# Patient Record
Sex: Female | Born: 1980
Health system: Southern US, Community
[De-identification: ages and names within clinical notes are randomized; demographics above are authoritative.]

## PROBLEM LIST (undated history)

## (undated) DIAGNOSIS — L509 Urticaria, unspecified: Secondary | ICD-10-CM

## (undated) DIAGNOSIS — G43909 Migraine, unspecified, not intractable, without status migrainosus: Secondary | ICD-10-CM

## (undated) DIAGNOSIS — I10 Essential (primary) hypertension: Secondary | ICD-10-CM

## (undated) DIAGNOSIS — N739 Female pelvic inflammatory disease, unspecified: Secondary | ICD-10-CM

## (undated) DIAGNOSIS — R51 Headache: Secondary | ICD-10-CM

## (undated) DIAGNOSIS — K219 Gastro-esophageal reflux disease without esophagitis: Secondary | ICD-10-CM

## (undated) DIAGNOSIS — Z9289 Personal history of other medical treatment: Secondary | ICD-10-CM

## (undated) DIAGNOSIS — F419 Anxiety disorder, unspecified: Secondary | ICD-10-CM

## (undated) DIAGNOSIS — J342 Deviated nasal septum: Secondary | ICD-10-CM

## (undated) DIAGNOSIS — N200 Calculus of kidney: Secondary | ICD-10-CM

## (undated) DIAGNOSIS — J343 Hypertrophy of nasal turbinates: Secondary | ICD-10-CM

## (undated) DIAGNOSIS — R109 Unspecified abdominal pain: Secondary | ICD-10-CM

## (undated) HISTORY — DX: Personal history of other medical treatment: Z92.89

## (undated) HISTORY — DX: Urticaria, unspecified: L50.9

## (undated) NOTE — *Deleted (*Deleted)
[facility] EMERGENCY DEPARTMENT Provider Note   CSN: [id number] Arrival date & time: 05/27/20  1328     History Chief Complaint  Patient presents with  . Chest Pain    Christina Fox is a 11 y.o. female.  HPI      Christina Fox is a 86 y.o. female, with a history of ***, presenting to the ED with chest pain.  Dx with bronchiitis, doxy and pred.   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 Abdominal pain    "right lower quadrant pain and right lower back pain  . Anxiety   . Deviated septum 09/2011  . GERD (gastroesophageal reflux disease)   . Headache(784.0)    sinus  . History of echocardiogram    Echo 11/17: EF 55-60, no RWMA, normal diastolic function  . Hypertension    no meds  . Kidney stones    no current problem  . Migraine   . Nasal turbinate hypertrophy 09/2011   bilat.  . Pelvic inflammatory disease   . Urticaria     Patient Active Problem List   Diagnosis Date Noted  . Gallstone 01/07/2016  . History of colonic polyps 11/27/2015  . Right lower quadrant abdominal pain 11/27/2015  . HTN (hypertension), benign 11/24/2015  . Abdominal pain 11/16/2015  . Headache 10/13/2015  . 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 Anxiety disorder 11/03/2014    Past Surgical History:  Procedure Laterality Date  . CHOLECYSTECTOMY N/A 01/15/2016   Procedure: LAPAROSCOPIC CHOLECYSTECTOMY WITH INTRAOPERATIVE CHOLANGIOGRAM;  Surgeon: Kieth Brightly, MD;  Location: [facility]S;  Service: General;  Laterality: N/A;  . COLONOSCOPY  2007   [facility]: pedunculated 15mm polyp in distal sigmoid, sessile 2mm polyp at splenic flexure, larger polyp tubulovillous adenoma and smaller polyp tubular adenoma   . COLONOSCOPY  09-09-15  . COLONOSCOPY WITH PROPOFOL N/A 03/01/2016   Procedure: COLONOSCOPY WITH PROPOFOL;  Surgeon: Charolett Bumpers, MD;  Location: [facility];  Service: Endoscopy;   Laterality: N/A;  . NASAL SEPTOPLASTY W/ TURBINOPLASTY  10/04/2011   Procedure: NASAL SEPTOPLASTY WITH TURBINATE REDUCTION;  Surgeon: Darletta Moll, MD;  Location: [facility];  Service: ENT;  Laterality: Bilateral;  . WISDOM TOOTH EXTRACTION  2009     OB History    Gravida  5   Para  4   Term  4   Preterm      AB  1   Living  4     SAB  1   TAB      Ectopic      Multiple      Live Births  4        Obstetric Comments  1st Menstrual Cycle:  10  1st Pregnancy:  70         Family History  Problem Relation Age of Onset  . Kidney cancer Mother   . Hypertension Mother   . Migraines Mother   . Multiple sclerosis Mother   . Fibromyalgia Mother   . Liver disease Mother        NASH   . Hypertension Father   . Liver disease Father        liver transplant; had fatty liver disease  . Spina bifida Brother   . Migraines Maternal Grandmother   . Hypertension Other   . Colon cancer Paternal Grandfather   . Allergic rhinitis Daughter   .  Asthma Daughter   . Allergic rhinitis Son   . Asthma Son   . Angioedema Neg Hx   . Eczema Neg Hx   . Immunodeficiency Neg Hx   . Urticaria Neg Hx     Social History   Tobacco Use  . Smoking status: Current Every Day Smoker    Packs/day: 0.00    Years: 10.00    Pack years: 0.00  . Smokeless tobacco: Never Used  Vaping Use  . Vaping Use: Never used  Substance Use Topics  . Alcohol use: Yes    Alcohol/week: 0.0 standard drinks    Comment: ocassionally  . Drug use: No    Home Medications Prior to Admission medications   Medication Sig Start Date End Date Taking? Authorizing Provider  acetaminophen (TYLENOL) 500 MG tablet Take 500-1,000 mg by mouth every 6 (six) hours as needed for mild pain.     [provider]  albuterol (VENTOLIN HFA) 108 (90 Base) MCG/ACT inhaler Inhale 2 puffs into the lungs every 6 (six) hours as needed for wheezing or shortness of breath.    [provider]  clonazePAM  (KLONOPIN) 0.5 MG tablet Take 0.5 mg by mouth 2 (two) times daily as needed for anxiety.     [provider]  cyclobenzaprine (FLEXERIL) 10 MG tablet Take 1 tablet (10 mg total) by mouth 2 (two) times daily as needed for muscle spasms. Patient not taking: Reported on 03/27/2020 12/15/19   Dahlia Byes A, NP  ibuprofen (ADVIL) 200 MG tablet Take 200 mg by mouth every 6 (six) hours as needed for moderate pain.     [provider]  levofloxacin (LEVAQUIN) 250 MG tablet Take 250 mg by mouth daily.    [provider]  loratadine (CLARITIN) 10 MG tablet Take 10 mg by mouth daily.    [provider]  montelukast (SINGULAIR) 10 MG tablet Take 10 mg by mouth at bedtime.    [provider]  nystatin (MYCOSTATIN) 100000 UNIT/ML suspension Take 5 mLs (500,000 Units total) by mouth 4 (four) times daily. Swish in mouth and swallow for up to 7 days until thrush resolves 05/21/20   Little, Ambrose Finland, MD  omeprazole (PRILOSEC) 40 MG capsule Take 40 mg by mouth daily.     [provider]  potassium chloride (KLOR-CON) 10 MEQ tablet Take 10 mEq by mouth daily.     [provider]  potassium chloride (KLOR-CON) 10 MEQ tablet Take 1 tablet (10 mEq total) by mouth 2 (two) times daily for 3 days. Patient not taking: Reported on 03/27/2020 03/01/20 03/27/20  Vanetta Mulders, MD  Prenat-Fe Poly-Methfol-FA-DHA (VITAFOL ULTRA) 29-0.6-0.4-200 MG CAPS Take 1 capsule by mouth daily before breakfast. 11/01/18   Brock Bad, MD  sertraline (ZOLOFT) 25 MG tablet Take 25 mg by mouth daily.    [provider]  Vitamin D, Ergocalciferol, (DRISDOL) 1.25 MG (50000 UNIT) CAPS capsule Take 50,000 Units by mouth See admin instructions. Twice weekly    [provider]  escitalopram (LEXAPRO) 10 MG tablet Take 10 mg by mouth daily.    09/02/11  [provider]    Allergies    Clarithromycin, Doxycycline, Dicyclomine, Sulfasalazine, Bentyl [dicyclomine  hcl], Haldol [haloperidol], Meclizine, Morphine and related, Toradol [ketorolac tromethamine], Macrobid [nitrofurantoin monohyd macro], Meclizine hcl, Nitrofurantoin, and Sulfa antibiotics  Review of Systems   Review of Systems  Physical Exam Updated Vital Signs BP (!) 162/92 (BP Location: Right Arm)   Pulse 75   Temp 98.7  F (37.1 C) (Oral)   Resp 20   Ht 5\' 2"  (1.575 m)   Wt 93.9 kg   LMP 05/17/2020   SpO2 98%   BMI 37.86 kg/m   Physical Exam  ED Results / Procedures / Treatments   Labs (all labs ordered are listed, but only abnormal results are displayed) Labs Reviewed  BASIC METABOLIC PANEL - Abnormal; Notable for the following components:      Result Value   Sodium 134 (*)    Potassium 3.4 (*)    Glucose, Bld 121 (*)    Calcium 8.8 (*)    All other components within normal limits  CBC - Abnormal; Notable for the following components:   WBC 14.8 (*)    All other components within normal limits  PREGNANCY, URINE  TROPONIN I (HIGH SENSITIVITY)  TROPONIN I (HIGH SENSITIVITY)    EKG None  Radiology DG Chest 2 View  Result Date: 05/27/2020 CLINICAL DATA:  Chest pain. EXAM: CHEST - 2 VIEW COMPARISON:  03/26/2020. FINDINGS: The heart size and mediastinal contours are within normal limits. Both lungs are clear. No visible pleural effusions or pneumothorax. No acute osseous abnormality. Cholecystectomy clips. IMPRESSION: No active cardiopulmonary disease. Electronically Signed   By: Feliberto Harts MD   On: 05/27/2020 14:31    Procedures Procedures (including critical care time)  Medications Ordered in ED Medications - No data to display  ED Course  I have reviewed the triage vital signs and the nursing notes.  Pertinent labs & imaging results that were available during my care of the patient were reviewed by me and considered in my medical decision making (see chart for details).    MDM Rules/Calculators/A&P                          *** Final Clinical  Impression(s) / ED Diagnoses Final diagnoses:  None    Rx / DC Orders ED Discharge Orders    None

---
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
## 1999-09-10 ENCOUNTER — Encounter: Admission: RE | Admit: 1999-09-10 | Discharge: 1999-09-10 | Payer: Self-pay | Admitting: Internal Medicine

## 1999-09-10 ENCOUNTER — Other Ambulatory Visit: Admission: RE | Admit: 1999-09-10 | Discharge: 1999-09-10 | Payer: Self-pay | Admitting: *Deleted

## 1999-09-16 ENCOUNTER — Inpatient Hospital Stay (HOSPITAL_COMMUNITY): Admission: AD | Admit: 1999-09-16 | Discharge: 1999-09-16 | Payer: Self-pay | Admitting: Obstetrics & Gynecology

## 2000-03-13 ENCOUNTER — Inpatient Hospital Stay (HOSPITAL_COMMUNITY): Admission: AD | Admit: 2000-03-13 | Discharge: 2000-03-13 | Payer: Self-pay | Admitting: Obstetrics

## 2000-03-13 ENCOUNTER — Encounter: Payer: Self-pay | Admitting: Obstetrics

## 2000-03-16 ENCOUNTER — Inpatient Hospital Stay (HOSPITAL_COMMUNITY): Admission: AD | Admit: 2000-03-16 | Discharge: 2000-03-16 | Payer: Self-pay | Admitting: *Deleted

## 2000-05-30 ENCOUNTER — Ambulatory Visit (HOSPITAL_COMMUNITY): Admission: RE | Admit: 2000-05-30 | Discharge: 2000-05-30 | Payer: Self-pay | Admitting: *Deleted

## 2000-05-30 ENCOUNTER — Encounter: Payer: Self-pay | Admitting: *Deleted

## 2000-09-20 ENCOUNTER — Inpatient Hospital Stay (HOSPITAL_COMMUNITY): Admission: AD | Admit: 2000-09-20 | Discharge: 2000-09-20 | Payer: Self-pay | Admitting: *Deleted

## 2000-09-23 ENCOUNTER — Inpatient Hospital Stay (HOSPITAL_COMMUNITY): Admission: AD | Admit: 2000-09-23 | Discharge: 2000-09-23 | Payer: Self-pay | Admitting: Obstetrics

## 2000-09-28 ENCOUNTER — Ambulatory Visit (HOSPITAL_COMMUNITY): Admission: RE | Admit: 2000-09-28 | Discharge: 2000-09-28 | Payer: Self-pay | Admitting: *Deleted

## 2000-10-08 ENCOUNTER — Inpatient Hospital Stay (HOSPITAL_COMMUNITY): Admission: AD | Admit: 2000-10-08 | Discharge: 2000-10-10 | Payer: Self-pay | Admitting: Obstetrics

## 2000-11-04 ENCOUNTER — Emergency Department (HOSPITAL_COMMUNITY): Admission: EM | Admit: 2000-11-04 | Discharge: 2000-11-04 | Payer: Self-pay | Admitting: Emergency Medicine

## 2001-04-17 ENCOUNTER — Inpatient Hospital Stay (HOSPITAL_COMMUNITY): Admission: AD | Admit: 2001-04-17 | Discharge: 2001-04-17 | Payer: Self-pay | Admitting: *Deleted

## 2001-04-18 ENCOUNTER — Encounter: Payer: Self-pay | Admitting: *Deleted

## 2001-05-02 ENCOUNTER — Encounter: Payer: Self-pay | Admitting: *Deleted

## 2001-05-02 ENCOUNTER — Inpatient Hospital Stay (HOSPITAL_COMMUNITY): Admission: RE | Admit: 2001-05-02 | Discharge: 2001-05-02 | Payer: Self-pay | Admitting: *Deleted

## 2001-06-01 ENCOUNTER — Encounter: Payer: Self-pay | Admitting: Obstetrics & Gynecology

## 2001-06-01 ENCOUNTER — Inpatient Hospital Stay (HOSPITAL_COMMUNITY): Admission: AD | Admit: 2001-06-01 | Discharge: 2001-06-01 | Payer: Self-pay | Admitting: Obstetrics & Gynecology

## 2001-06-18 ENCOUNTER — Inpatient Hospital Stay (HOSPITAL_COMMUNITY): Admission: AD | Admit: 2001-06-18 | Discharge: 2001-06-18 | Payer: Self-pay | Admitting: Obstetrics

## 2001-07-09 ENCOUNTER — Encounter: Admission: RE | Admit: 2001-07-09 | Discharge: 2001-07-09 | Payer: Self-pay

## 2001-07-18 ENCOUNTER — Inpatient Hospital Stay (HOSPITAL_COMMUNITY): Admission: AD | Admit: 2001-07-18 | Discharge: 2001-07-18 | Payer: Self-pay | Admitting: *Deleted

## 2001-07-23 ENCOUNTER — Ambulatory Visit (HOSPITAL_COMMUNITY): Admission: RE | Admit: 2001-07-23 | Discharge: 2001-07-23 | Payer: Self-pay | Admitting: *Deleted

## 2001-09-10 ENCOUNTER — Ambulatory Visit (HOSPITAL_COMMUNITY): Admission: RE | Admit: 2001-09-10 | Discharge: 2001-09-10 | Payer: Self-pay | Admitting: Obstetrics

## 2001-09-10 ENCOUNTER — Encounter: Payer: Self-pay | Admitting: Obstetrics

## 2001-12-13 ENCOUNTER — Encounter: Payer: Self-pay | Admitting: Obstetrics

## 2001-12-13 ENCOUNTER — Ambulatory Visit (HOSPITAL_COMMUNITY): Admission: RE | Admit: 2001-12-13 | Discharge: 2001-12-13 | Payer: Self-pay | Admitting: Obstetrics and Gynecology

## 2001-12-17 ENCOUNTER — Inpatient Hospital Stay (HOSPITAL_COMMUNITY): Admission: AD | Admit: 2001-12-17 | Discharge: 2001-12-18 | Payer: Self-pay | Admitting: Obstetrics

## 2002-10-21 ENCOUNTER — Encounter: Admission: RE | Admit: 2002-10-21 | Discharge: 2002-10-21 | Payer: Self-pay | Admitting: Internal Medicine

## 2003-03-09 ENCOUNTER — Emergency Department (HOSPITAL_COMMUNITY): Admission: EM | Admit: 2003-03-09 | Discharge: 2003-03-09 | Payer: Self-pay | Admitting: Emergency Medicine

## 2004-02-06 ENCOUNTER — Inpatient Hospital Stay (HOSPITAL_COMMUNITY): Admission: AD | Admit: 2004-02-06 | Discharge: 2004-02-06 | Payer: Self-pay | Admitting: Obstetrics & Gynecology

## 2004-12-03 ENCOUNTER — Emergency Department (HOSPITAL_COMMUNITY): Admission: EM | Admit: 2004-12-03 | Discharge: 2004-12-03 | Payer: Self-pay | Admitting: Emergency Medicine

## 2004-12-30 ENCOUNTER — Emergency Department (HOSPITAL_COMMUNITY): Admission: EM | Admit: 2004-12-30 | Discharge: 2004-12-30 | Payer: Self-pay | Admitting: Family Medicine

## 2005-03-17 ENCOUNTER — Emergency Department (HOSPITAL_COMMUNITY): Admission: EM | Admit: 2005-03-17 | Discharge: 2005-03-17 | Payer: Self-pay | Admitting: Emergency Medicine

## 2005-04-07 ENCOUNTER — Encounter: Admission: RE | Admit: 2005-04-07 | Discharge: 2005-04-07 | Payer: Self-pay | Admitting: Obstetrics

## 2005-05-10 ENCOUNTER — Emergency Department (HOSPITAL_COMMUNITY): Admission: EM | Admit: 2005-05-10 | Discharge: 2005-05-10 | Payer: Self-pay | Admitting: Emergency Medicine

## 2005-06-27 HISTORY — PX: COLONOSCOPY: SHX174

## 2005-09-14 ENCOUNTER — Emergency Department (HOSPITAL_COMMUNITY): Admission: EM | Admit: 2005-09-14 | Discharge: 2005-09-14 | Payer: Self-pay | Admitting: Family Medicine

## 2005-12-13 ENCOUNTER — Emergency Department (HOSPITAL_COMMUNITY): Admission: EM | Admit: 2005-12-13 | Discharge: 2005-12-13 | Payer: Self-pay | Admitting: Family Medicine

## 2006-04-07 ENCOUNTER — Emergency Department (HOSPITAL_COMMUNITY): Admission: EM | Admit: 2006-04-07 | Discharge: 2006-04-07 | Payer: Self-pay | Admitting: Family Medicine

## 2006-04-23 ENCOUNTER — Emergency Department (HOSPITAL_COMMUNITY): Admission: EM | Admit: 2006-04-23 | Discharge: 2006-04-23 | Payer: Self-pay | Admitting: Family Medicine

## 2006-06-24 ENCOUNTER — Emergency Department (HOSPITAL_COMMUNITY): Admission: EM | Admit: 2006-06-24 | Discharge: 2006-06-24 | Payer: Self-pay | Admitting: Emergency Medicine

## 2006-09-22 ENCOUNTER — Encounter: Admission: RE | Admit: 2006-09-22 | Discharge: 2006-09-22 | Payer: Self-pay | Admitting: Internal Medicine

## 2007-04-16 ENCOUNTER — Emergency Department (HOSPITAL_COMMUNITY): Admission: EM | Admit: 2007-04-16 | Discharge: 2007-04-16 | Payer: Self-pay | Admitting: Family Medicine

## 2007-06-01 ENCOUNTER — Emergency Department (HOSPITAL_COMMUNITY): Admission: EM | Admit: 2007-06-01 | Discharge: 2007-06-01 | Payer: Self-pay | Admitting: Family Medicine

## 2007-06-28 HISTORY — PX: WISDOM TOOTH EXTRACTION: SHX21

## 2007-07-09 ENCOUNTER — Emergency Department (HOSPITAL_COMMUNITY): Admission: EM | Admit: 2007-07-09 | Discharge: 2007-07-09 | Payer: Self-pay | Admitting: Emergency Medicine

## 2007-07-11 ENCOUNTER — Emergency Department (HOSPITAL_COMMUNITY): Admission: EM | Admit: 2007-07-11 | Discharge: 2007-07-11 | Payer: Self-pay | Admitting: *Deleted

## 2007-11-24 ENCOUNTER — Inpatient Hospital Stay (HOSPITAL_COMMUNITY): Admission: AD | Admit: 2007-11-24 | Discharge: 2007-11-24 | Payer: Self-pay | Admitting: Obstetrics

## 2008-02-22 ENCOUNTER — Ambulatory Visit (HOSPITAL_COMMUNITY): Admission: RE | Admit: 2008-02-22 | Discharge: 2008-02-22 | Payer: Self-pay | Admitting: Obstetrics

## 2008-03-01 ENCOUNTER — Inpatient Hospital Stay (HOSPITAL_COMMUNITY): Admission: AD | Admit: 2008-03-01 | Discharge: 2008-03-01 | Payer: Self-pay | Admitting: Obstetrics & Gynecology

## 2008-03-13 ENCOUNTER — Inpatient Hospital Stay (HOSPITAL_COMMUNITY): Admission: AD | Admit: 2008-03-13 | Discharge: 2008-03-13 | Payer: Self-pay | Admitting: Obstetrics

## 2008-06-22 ENCOUNTER — Emergency Department (HOSPITAL_COMMUNITY): Admission: EM | Admit: 2008-06-22 | Discharge: 2008-06-22 | Payer: Self-pay | Admitting: Family Medicine

## 2008-07-27 ENCOUNTER — Emergency Department (HOSPITAL_COMMUNITY): Admission: EM | Admit: 2008-07-27 | Discharge: 2008-07-27 | Payer: Self-pay | Admitting: Family Medicine

## 2009-04-06 ENCOUNTER — Inpatient Hospital Stay (HOSPITAL_COMMUNITY): Admission: AD | Admit: 2009-04-06 | Discharge: 2009-04-06 | Payer: Self-pay | Admitting: Obstetrics

## 2009-04-27 ENCOUNTER — Ambulatory Visit (HOSPITAL_COMMUNITY): Admission: RE | Admit: 2009-04-27 | Discharge: 2009-04-27 | Payer: Self-pay | Admitting: Obstetrics

## 2009-06-29 ENCOUNTER — Inpatient Hospital Stay (HOSPITAL_COMMUNITY): Admission: AD | Admit: 2009-06-29 | Discharge: 2009-06-29 | Payer: Self-pay | Admitting: Obstetrics

## 2009-07-09 ENCOUNTER — Ambulatory Visit (HOSPITAL_COMMUNITY): Admission: RE | Admit: 2009-07-09 | Discharge: 2009-07-09 | Payer: Self-pay | Admitting: Obstetrics

## 2009-08-27 ENCOUNTER — Inpatient Hospital Stay (HOSPITAL_COMMUNITY): Admission: AD | Admit: 2009-08-27 | Discharge: 2009-08-27 | Payer: Self-pay | Admitting: Obstetrics

## 2009-11-23 ENCOUNTER — Inpatient Hospital Stay (HOSPITAL_COMMUNITY): Admission: AD | Admit: 2009-11-23 | Discharge: 2009-11-23 | Payer: Self-pay | Admitting: Obstetrics

## 2009-12-07 ENCOUNTER — Inpatient Hospital Stay (HOSPITAL_COMMUNITY): Admission: RE | Admit: 2009-12-07 | Discharge: 2009-12-09 | Payer: Self-pay | Admitting: Obstetrics

## 2010-05-18 ENCOUNTER — Inpatient Hospital Stay (HOSPITAL_COMMUNITY)
Admission: AD | Admit: 2010-05-18 | Discharge: 2010-05-18 | Payer: Self-pay | Source: Home / Self Care | Admitting: Obstetrics

## 2010-07-18 ENCOUNTER — Encounter: Payer: Self-pay | Admitting: Obstetrics

## 2010-08-21 ENCOUNTER — Inpatient Hospital Stay (INDEPENDENT_AMBULATORY_CARE_PROVIDER_SITE_OTHER)
Admission: RE | Admit: 2010-08-21 | Discharge: 2010-08-21 | Disposition: A | Discharge status: Home / Self Care | Payer: Medicaid Other | Source: Ambulatory Visit | Attending: Family Medicine | Admitting: Family Medicine

## 2010-08-21 DIAGNOSIS — B9789 Other viral agents as the cause of diseases classified elsewhere: Secondary | ICD-10-CM

## 2010-08-21 DIAGNOSIS — K5289 Other specified noninfective gastroenteritis and colitis: Secondary | ICD-10-CM

## 2010-09-08 LAB — POCT PREGNANCY, URINE: Preg Test, Ur: NEGATIVE

## 2010-09-08 LAB — GC/CHLAMYDIA PROBE AMP, GENITAL
Chlamydia, DNA Probe: NEGATIVE
GC Probe Amp, Genital: NEGATIVE

## 2010-09-08 LAB — CBC
HCT: 36.7 % (ref 36.0–46.0)
Hemoglobin: 12.3 g/dL (ref 12.0–15.0)
MCH: 30.2 pg (ref 26.0–34.0)
MCHC: 33.6 g/dL (ref 30.0–36.0)
MCV: 89.7 fL (ref 78.0–100.0)
Platelets: 254 10*3/uL (ref 150–400)
RBC: 4.09 MIL/uL (ref 3.87–5.11)
RDW: 13.1 % (ref 11.5–15.5)
WBC: 9.5 10*3/uL (ref 4.0–10.5)

## 2010-09-08 LAB — URINALYSIS, ROUTINE W REFLEX MICROSCOPIC
Bilirubin Urine: NEGATIVE
Glucose, UA: 250 mg/dL — AB
Ketones, ur: 15 mg/dL — AB
Nitrite: POSITIVE — AB
Protein, ur: 100 mg/dL — AB
Specific Gravity, Urine: 1.025 (ref 1.005–1.030)
Urobilinogen, UA: >8 mg/dL — ABNORMAL HIGH (ref 0.0–1.0)
pH: 5 (ref 5.0–8.0)

## 2010-09-08 LAB — WET PREP, GENITAL
Clue Cells Wet Prep HPF POC: NONE SEEN
Trich, Wet Prep: NONE SEEN
Yeast Wet Prep HPF POC: NONE SEEN

## 2010-09-08 LAB — URINE MICROSCOPIC-ADD ON

## 2010-09-12 LAB — URINALYSIS, ROUTINE W REFLEX MICROSCOPIC
Bilirubin Urine: NEGATIVE
Glucose, UA: NEGATIVE mg/dL
Ketones, ur: 15 mg/dL — AB
Leukocytes, UA: NEGATIVE
Nitrite: NEGATIVE
Protein, ur: NEGATIVE mg/dL
Specific Gravity, Urine: >1.03 — ABNORMAL HIGH (ref 1.005–1.030)
Urobilinogen, UA: 0.2 mg/dL (ref 0.0–1.0)
pH: 5.5 (ref 5.0–8.0)

## 2010-09-12 LAB — DIFFERENTIAL
Basophils Relative: 0 % (ref 0–1)
Eosinophils Absolute: 0.3 10*3/uL (ref 0.0–0.7)
Monocytes Relative: 7 % (ref 3–12)
Neutrophils Relative %: 83 % — ABNORMAL HIGH (ref 43–77)

## 2010-09-12 LAB — CBC
MCHC: 34.4 g/dL (ref 30.0–36.0)
MCV: 92.1 fL (ref 78.0–100.0)
Platelets: 234 10*3/uL (ref 150–400)
RBC: 3.8 MIL/uL — ABNORMAL LOW (ref 3.87–5.11)
RDW: 12.9 % (ref 11.5–15.5)

## 2010-09-12 LAB — URINE MICROSCOPIC-ADD ON

## 2010-09-13 LAB — URINALYSIS, ROUTINE W REFLEX MICROSCOPIC
Glucose, UA: NEGATIVE mg/dL
Nitrite: NEGATIVE
Protein, ur: NEGATIVE mg/dL
Urobilinogen, UA: 0.2 mg/dL (ref 0.0–1.0)

## 2010-09-13 LAB — CBC
MCHC: 34.5 g/dL (ref 30.0–36.0)
MCHC: 34.8 g/dL (ref 30.0–36.0)
RBC: 3.57 MIL/uL — ABNORMAL LOW (ref 3.87–5.11)
RBC: 3.58 MIL/uL — ABNORMAL LOW (ref 3.87–5.11)
RDW: 13.3 % (ref 11.5–15.5)

## 2010-09-13 LAB — RPR: RPR Ser Ql: NONREACTIVE

## 2010-09-13 LAB — MRSA PCR SCREENING: MRSA by PCR: NEGATIVE

## 2010-09-20 LAB — URINALYSIS, ROUTINE W REFLEX MICROSCOPIC
Ketones, ur: NEGATIVE mg/dL
Nitrite: NEGATIVE
Protein, ur: NEGATIVE mg/dL
Urobilinogen, UA: 0.2 mg/dL (ref 0.0–1.0)

## 2010-10-01 LAB — WET PREP, GENITAL
Trich, Wet Prep: NONE SEEN
Yeast Wet Prep HPF POC: NONE SEEN

## 2010-10-01 LAB — URINALYSIS, ROUTINE W REFLEX MICROSCOPIC
Bilirubin Urine: NEGATIVE
Ketones, ur: NEGATIVE mg/dL
Nitrite: NEGATIVE
Urobilinogen, UA: 0.2 mg/dL (ref 0.0–1.0)

## 2010-10-01 LAB — URINE MICROSCOPIC-ADD ON

## 2010-10-01 LAB — CBC
MCHC: 33.2 g/dL (ref 30.0–36.0)
MCV: 92.3 fL (ref 78.0–100.0)
RBC: 4.41 MIL/uL (ref 3.87–5.11)
RDW: 12.3 % (ref 11.5–15.5)

## 2010-10-01 LAB — HCG, QUANTITATIVE, PREGNANCY: hCG, Beta Chain, Quant, S: 4941 m[IU]/mL — ABNORMAL HIGH (ref <5)

## 2011-03-02 ENCOUNTER — Encounter: Payer: Self-pay | Admitting: *Deleted

## 2011-03-02 ENCOUNTER — Emergency Department (INDEPENDENT_AMBULATORY_CARE_PROVIDER_SITE_OTHER): Payer: Medicaid Other

## 2011-03-02 ENCOUNTER — Emergency Department (HOSPITAL_BASED_OUTPATIENT_CLINIC_OR_DEPARTMENT_OTHER)
Admission: EM | Admit: 2011-03-02 | Discharge: 2011-03-02 | Disposition: A | Discharge status: Home / Self Care | Payer: Medicaid Other | Attending: Emergency Medicine | Admitting: Emergency Medicine

## 2011-03-02 DIAGNOSIS — M549 Dorsalgia, unspecified: Secondary | ICD-10-CM | POA: Insufficient documentation

## 2011-03-02 DIAGNOSIS — G8929 Other chronic pain: Secondary | ICD-10-CM | POA: Insufficient documentation

## 2011-03-02 DIAGNOSIS — M542 Cervicalgia: Secondary | ICD-10-CM

## 2011-03-02 DIAGNOSIS — F3289 Other specified depressive episodes: Secondary | ICD-10-CM | POA: Insufficient documentation

## 2011-03-02 DIAGNOSIS — F329 Major depressive disorder, single episode, unspecified: Secondary | ICD-10-CM | POA: Insufficient documentation

## 2011-03-02 MED ORDER — IBUPROFEN 800 MG PO TABS: 800.0000 mg | ORAL_TABLET | Freq: Three times a day (TID) | ORAL | Status: AC

## 2011-03-02 MED ORDER — HYDROCODONE-ACETAMINOPHEN 5-325 MG PO TABS
2.0000 | ORAL_TABLET | Freq: Once | ORAL | Status: AC
  Administered 2011-03-02: 2 via ORAL
  Filled 2011-03-02: qty 2

## 2011-03-02 MED ORDER — HYDROCODONE-ACETAMINOPHEN 5-325 MG PO TABS: 2.0000 | ORAL_TABLET | ORAL | Status: AC | PRN

## 2011-03-02 NOTE — ED Notes (Signed)
Pt c/o back pain and neck pain x 1 day w/o injury

## 2011-03-02 NOTE — ED Notes (Signed)
Pt c/o severe lower back pain that radiates upwards through the spine into the neck. Began earlier today. Denies any known injury or strenuous activity. Took advil approx 9hours ago with minimal relief. Pt states she has been seen at different facilities 4 times in the past year for back pain. Pt also here for pressure in her ears for several days and a "stabbing" feeling in her throat. Pt has allergies and is currently taking alka-seltzer plus allergies for symptoms with minimal relief. Pt is anxious on exam.

## 2011-03-02 NOTE — ED Provider Notes (Signed)
History     CSN: [id number] Arrival date & time: 03/02/2011  8:39 PM  Chief Complaint  Patient presents with  . Back Pain  . Neck Injury   Patient is a 30 y.o. female presenting with back pain and neck injury. The history is provided by the patient.  Back Pain  This is a new problem. The current episode started yesterday. The problem occurs constantly. The problem has been gradually worsening. The pain is associated with no known injury. The pain is present in the thoracic spine and lumbar spine. The pain does not radiate. The pain is at a severity of 6/10. The pain is moderate. The pain is worse during the day. She has tried nothing for the symptoms. The treatment provided moderate relief.  Neck Injury This is a new problem. The current episode started in the past 7 days. The problem occurs constantly. The problem has been gradually worsening. The symptoms are aggravated by nothing. She has tried nothing for the symptoms. The treatment provided no relief.  P  Past Medical History  Diagnosis Date  . Depression   . Kidney calculi     History reviewed. No pertinent past surgical history.  History reviewed. No pertinent family history.  History  Substance Use Topics  . Smoking status: Never Smoker   . Smokeless tobacco: Not on file  . Alcohol Use: No    OB History    Grav Para Term Preterm Abortions TAB SAB Ect Mult Living                  Review of Systems  Musculoskeletal: Positive for back pain.  All other systems reviewed and are negative.    Physical Exam  BP 125/72  Pulse 61  Temp(Src) 98.4 F (36.9 C) (Oral)  Resp 16  Wt 168 lb (76.204 kg)  SpO2 100%  LMP 02/11/2011  Physical Exam  Nursing note and vitals reviewed. Constitutional: She is oriented to person, place, and time. She appears well-developed and well-nourished.  HENT:  Head: Normocephalic and atraumatic.  Eyes: Conjunctivae and EOM are normal. Pupils are equal, round, and reactive to light.    Neck: Neck supple. Muscular tenderness present. Decreased range of motion present. No Brudzinski's sign and no Kernig's sign noted.  Cardiovascular: Normal rate.   Pulmonary/Chest: Effort normal.  Abdominal: Soft.  Musculoskeletal:       Diffusely tender t and ls spine  Neurological: She is alert and oriented to person, place, and time.  Skin: Skin is warm and dry.    ED Course  Procedures  MDM c spine no acute.  I will refer pt to Dr. Pearletha Forge for further evaluation.      Langston Masker, [place] 03/02/11 2218

## 2011-03-03 NOTE — ED Provider Notes (Signed)
Medical screening examination/treatment/procedure(s) were performed by non-physician practitioner and as supervising physician I was immediately available for consultation/collaboration.   Summers Buendia, MD 03/03/11 0154 

## 2011-03-17 LAB — URINALYSIS, ROUTINE W REFLEX MICROSCOPIC
Bilirubin Urine: NEGATIVE
Bilirubin Urine: NEGATIVE
Glucose, UA: NEGATIVE
Nitrite: NEGATIVE
Nitrite: NEGATIVE
Protein, ur: 30 — AB
Specific Gravity, Urine: 1.03
Specific Gravity, Urine: 1.03
Urobilinogen, UA: 0.2
Urobilinogen, UA: 0.2
pH: 7.5
pH: 8

## 2011-03-17 LAB — COMPREHENSIVE METABOLIC PANEL WITH GFR
AST: 14
Albumin: 4.2
CO2: 25
Calcium: 9.2
Creatinine, Ser: 0.63
GFR calc Af Amer: >60
GFR calc non Af Amer: >60
Total Protein: 6.9

## 2011-03-17 LAB — COMPREHENSIVE METABOLIC PANEL
ALT: 11
ALT: 13
AST: 17
Albumin: 3.7
Alkaline Phosphatase: 45
BUN: 8
CO2: 28
Calcium: 9.1
Chloride: 104
Creatinine, Ser: 0.67
GFR calc Af Amer: >60
GFR calc non Af Amer: >60
Glucose, Bld: 102 — ABNORMAL HIGH
Potassium: 3.5
Sodium: 134 — ABNORMAL LOW
Sodium: 139
Total Bilirubin: 1
Total Protein: 6

## 2011-03-17 LAB — CBC
HCT: 36.7
Hemoglobin: 13
MCHC: 35
MCHC: 35.5
MCV: 88.2
MCV: 88.4
Platelets: 255
Platelets: 276
RBC: 3.98
RBC: 4.17
RDW: 12.3
RDW: 12.5
WBC: 12.6 — ABNORMAL HIGH

## 2011-03-17 LAB — URINE MICROSCOPIC-ADD ON

## 2011-03-17 LAB — WET PREP, GENITAL
Trich, Wet Prep: NONE SEEN
WBC, Wet Prep HPF POC: NONE SEEN
Yeast Wet Prep HPF POC: NONE SEEN

## 2011-03-17 LAB — PREGNANCY, URINE
Preg Test, Ur: NEGATIVE
Preg Test, Ur: NEGATIVE

## 2011-03-17 LAB — DIFFERENTIAL
Basophils Absolute: 0.1
Basophils Relative: 1
Eosinophils Absolute: 0.4
Eosinophils Absolute: 0.5
Eosinophils Relative: 3
Eosinophils Relative: 8 — ABNORMAL HIGH
Lymphocytes Relative: 20
Lymphocytes Relative: 8 — ABNORMAL LOW
Lymphs Abs: 1.1
Lymphs Abs: 1.2
Monocytes Absolute: 0.7
Monocytes Relative: 11
Monocytes Relative: 6
Neutro Abs: 10.4 — ABNORMAL HIGH
Neutrophils Relative %: 82 — ABNORMAL HIGH

## 2011-03-17 LAB — GC/CHLAMYDIA PROBE AMP, GENITAL
Chlamydia, DNA Probe: NEGATIVE
GC Probe Amp, Genital: NEGATIVE

## 2011-03-23 LAB — URINE CULTURE: Colony Count: >=100000

## 2011-03-23 LAB — URINE MICROSCOPIC-ADD ON

## 2011-03-23 LAB — URINALYSIS, ROUTINE W REFLEX MICROSCOPIC
Glucose, UA: NEGATIVE
Ketones, ur: NEGATIVE
pH: 6

## 2011-03-23 LAB — POCT PREGNANCY, URINE
Operator id: 292781
Preg Test, Ur: NEGATIVE

## 2011-03-23 LAB — DIFFERENTIAL
Blasts: 0
Eosinophils Absolute: 0.4
Metamyelocytes Relative: 0
Myelocytes: 0
Promyelocytes Absolute: 0
nRBC: 0

## 2011-03-23 LAB — CBC
HCT: 38.1
MCHC: 35.6
MCV: 91.3
Platelets: 288
RDW: 12.2

## 2011-03-23 LAB — WET PREP, GENITAL
Clue Cells Wet Prep HPF POC: NONE SEEN
Trich, Wet Prep: NONE SEEN

## 2011-03-28 LAB — URINALYSIS, ROUTINE W REFLEX MICROSCOPIC
Bilirubin Urine: NEGATIVE
Ketones, ur: NEGATIVE
Leukocytes, UA: NEGATIVE
Nitrite: NEGATIVE
Protein, ur: NEGATIVE
Specific Gravity, Urine: >1.03 — ABNORMAL HIGH

## 2011-03-28 LAB — URINE MICROSCOPIC-ADD ON

## 2011-03-28 LAB — URINE CULTURE
Colony Count: NO GROWTH
Culture: NO GROWTH

## 2011-03-28 LAB — POCT PREGNANCY, URINE: Preg Test, Ur: NEGATIVE

## 2011-03-30 LAB — URINALYSIS, ROUTINE W REFLEX MICROSCOPIC
Bilirubin Urine: NEGATIVE
Glucose, UA: NEGATIVE
Specific Gravity, Urine: >1.03 — ABNORMAL HIGH
Urobilinogen, UA: 0.2

## 2011-03-30 LAB — CBC
HCT: 39
Hemoglobin: 13.1
MCHC: 33.5
RBC: 4.16
RDW: 13.1

## 2011-03-30 LAB — DIFFERENTIAL
Basophils Absolute: 0.1
Eosinophils Relative: 6 — ABNORMAL HIGH
Lymphocytes Relative: 23
Monocytes Absolute: 0.9
Monocytes Relative: 10
Neutro Abs: 5.3

## 2011-03-30 LAB — URINE MICROSCOPIC-ADD ON

## 2011-03-30 LAB — GC/CHLAMYDIA PROBE AMP, GENITAL: Chlamydia, DNA Probe: NEGATIVE

## 2011-03-30 LAB — WET PREP, GENITAL

## 2011-03-30 LAB — POCT PREGNANCY, URINE: Preg Test, Ur: NEGATIVE

## 2011-04-01 LAB — POCT PREGNANCY, URINE: Preg Test, Ur: NEGATIVE

## 2011-04-04 LAB — POCT URINALYSIS DIP (DEVICE)
Nitrite: NEGATIVE
Operator id: 126491
Protein, ur: 30 — AB
Urobilinogen, UA: 1
pH: 6.5

## 2011-04-04 LAB — POCT RAPID STREP A: Streptococcus, Group A Screen (Direct): NEGATIVE

## 2011-04-21 ENCOUNTER — Emergency Department (HOSPITAL_COMMUNITY): Payer: Medicaid Other

## 2011-04-21 ENCOUNTER — Emergency Department (HOSPITAL_COMMUNITY)
Admission: EM | Admit: 2011-04-21 | Discharge: 2011-04-21 | Disposition: A | Discharge status: Home / Self Care | Payer: Medicaid Other | Attending: Emergency Medicine | Admitting: Emergency Medicine

## 2011-04-21 DIAGNOSIS — J069 Acute upper respiratory infection, unspecified: Secondary | ICD-10-CM | POA: Insufficient documentation

## 2011-04-21 DIAGNOSIS — Z79899 Other long term (current) drug therapy: Secondary | ICD-10-CM | POA: Insufficient documentation

## 2011-04-21 DIAGNOSIS — J3489 Other specified disorders of nose and nasal sinuses: Secondary | ICD-10-CM | POA: Insufficient documentation

## 2011-04-21 DIAGNOSIS — R05 Cough: Secondary | ICD-10-CM | POA: Insufficient documentation

## 2011-04-21 DIAGNOSIS — R059 Cough, unspecified: Secondary | ICD-10-CM | POA: Insufficient documentation

## 2011-04-21 DIAGNOSIS — B9789 Other viral agents as the cause of diseases classified elsewhere: Secondary | ICD-10-CM | POA: Insufficient documentation

## 2011-05-19 ENCOUNTER — Emergency Department (HOSPITAL_COMMUNITY)
Admission: EM | Admit: 2011-05-19 | Discharge: 2011-05-20 | Disposition: A | Discharge status: Home / Self Care | Payer: Medicaid Other | Attending: Emergency Medicine | Admitting: Emergency Medicine

## 2011-05-19 ENCOUNTER — Encounter (HOSPITAL_COMMUNITY): Payer: Self-pay | Admitting: *Deleted

## 2011-05-19 DIAGNOSIS — J3489 Other specified disorders of nose and nasal sinuses: Secondary | ICD-10-CM | POA: Insufficient documentation

## 2011-05-19 DIAGNOSIS — F329 Major depressive disorder, single episode, unspecified: Secondary | ICD-10-CM | POA: Insufficient documentation

## 2011-05-19 DIAGNOSIS — Z79899 Other long term (current) drug therapy: Secondary | ICD-10-CM | POA: Insufficient documentation

## 2011-05-19 DIAGNOSIS — R05 Cough: Secondary | ICD-10-CM

## 2011-05-19 DIAGNOSIS — R059 Cough, unspecified: Secondary | ICD-10-CM | POA: Insufficient documentation

## 2011-05-19 DIAGNOSIS — F3289 Other specified depressive episodes: Secondary | ICD-10-CM | POA: Insufficient documentation

## 2011-05-19 DIAGNOSIS — Z87442 Personal history of urinary calculi: Secondary | ICD-10-CM | POA: Insufficient documentation

## 2011-05-19 DIAGNOSIS — IMO0002 Reserved for concepts with insufficient information to code with codable children: Secondary | ICD-10-CM | POA: Insufficient documentation

## 2011-05-19 DIAGNOSIS — R51 Headache: Secondary | ICD-10-CM | POA: Insufficient documentation

## 2011-05-19 NOTE — ED Notes (Signed)
Pt in c/o cough and body aches x2 months, states she has been seen for same and completed antibiotics

## 2011-05-20 MED ORDER — PREDNISONE 20 MG PO TABS
40.0000 mg | ORAL_TABLET | Freq: Once | ORAL | Status: AC
  Administered 2011-05-20: 40 mg via ORAL
  Filled 2011-05-20: qty 2

## 2011-05-20 MED ORDER — AEROCHAMBER MAX W/MASK SMALL MISC
1.0000 | Freq: Once | Status: AC
  Administered 2011-05-20: 1

## 2011-05-20 MED ORDER — PREDNISONE 20 MG PO TABS: 40.0000 mg | ORAL_TABLET | Freq: Every day | ORAL | Status: AC

## 2011-05-20 MED ORDER — ALBUTEROL SULFATE HFA 108 (90 BASE) MCG/ACT IN AERS
2.0000 | INHALATION_SPRAY | RESPIRATORY_TRACT | Status: DC | PRN
  Administered 2011-05-20: 2 via RESPIRATORY_TRACT
  Filled 2011-05-20: qty 6.7

## 2011-05-20 NOTE — ED Provider Notes (Signed)
History     CSN: [id number] Arrival date & time: 05/19/2011  9:22 PM   First MD Initiated Contact with Patient 05/19/11 2349      Chief Complaint  Patient presents with  . Cough    (Consider location/radiation/quality/duration/timing/severity/associated sxs/prior treatment) HPI Comments: Patient with two-month history of cough and nasal congestion. She has been seen in the emergency department prior and by her primary care doctor. She has completed a course of antibiotics and has used albuterol inhalers as well as cough syrups. She continues to have chronic cough and nasal congestion despite these measures. Her cough has not changed but is persistent. She denies fever, ear pain, sore throat, nausea, vomiting, diarrhea, urinary symptoms.  Patient is a 30 y.o. female presenting with cough. The history is provided by the patient.  Cough This is a chronic problem. The current episode started more than 1 week ago. The problem has not changed since onset.The cough is productive of sputum. There has been no fever. Associated symptoms include ear congestion, headaches and rhinorrhea. Pertinent negatives include no chest pain, no chills, no weight loss, no ear pain, no sore throat, no myalgias, no shortness of breath, no wheezing and no eye redness. She has tried cough syrup, an opioid and decongestants for the symptoms.    Past Medical History  Diagnosis Date  . Depression   . Kidney calculi     History reviewed. No pertinent past surgical history.  History reviewed. No pertinent family history.  History  Substance Use Topics  . Smoking status: Never Smoker   . Smokeless tobacco: Not on file  . Alcohol Use: No    OB History    Grav Para Term Preterm Abortions TAB SAB Ect Mult Living                  Review of Systems  Constitutional: Negative for fever, chills and weight loss.  HENT: Positive for rhinorrhea. Negative for ear pain and sore throat.   Eyes: Negative for discharge  and redness.  Respiratory: Positive for cough. Negative for shortness of breath and wheezing.   Cardiovascular: Negative for chest pain.  Gastrointestinal: Negative for nausea, vomiting, abdominal pain, diarrhea and constipation.  Genitourinary: Negative for dysuria and hematuria.  Musculoskeletal: Negative for myalgias.  Skin: Negative for rash.  Neurological: Positive for headaches.  Psychiatric/Behavioral: Negative for confusion.    Allergies  Sulfa antibiotics  Home Medications   Current Outpatient Rx  Name Route Sig Dispense Refill  . ALKA-SELTZER PO Oral Take 325 mg by mouth at bedtime as needed. Seasonal allergies     . BUTALBITAL-APAP-CAFFEINE 50-325-40 MG PO TABS Oral Take 2 tablets by mouth 2 (two) times daily as needed. migraine     . ESCITALOPRAM OXALATE 10 MG PO TABS Oral Take 10 mg by mouth daily.      . ETONOGESTREL 68 MG [place] IMPL Subcutaneous Inject into the skin once.      Marland Kitchen PREDNISONE 20 MG PO TABS Oral Take 2 tablets (40 mg total) by mouth daily. 8 tablet 0    BP 128/89  Pulse 72  Temp(Src) 98.5 F (36.9 C) (Oral)  Resp 16  SpO2 100%  LMP 05/08/2011  Physical Exam  Nursing note and vitals reviewed. Constitutional: She is oriented to person, place, and time. She appears well-developed and well-nourished.  HENT:  Head: Normocephalic and atraumatic.  Right Ear: External ear normal.  Left Ear: External ear normal.  Nose: Mucosal edema and rhinorrhea present.  Mouth/Throat: Oropharynx  is clear and moist. No oropharyngeal exudate.  Eyes: Pupils are equal, round, and reactive to light. Right eye exhibits no discharge. Left eye exhibits no discharge.  Neck: Normal range of motion. Neck supple.  Cardiovascular: Normal rate and regular rhythm.  Exam reveals no gallop and no friction rub.   No murmur heard. Pulmonary/Chest: Effort normal and breath sounds normal. No respiratory distress. She has no wheezes.  Abdominal: Soft. There is no tenderness. There is no  rebound and no guarding.  Musculoskeletal: Normal range of motion.  Neurological: She is alert and oriented to person, place, and time.  Skin: Skin is warm and dry. No rash noted.  Psychiatric: She has a normal mood and affect.    ED Course  Procedures (including critical care time)  Labs Reviewed - No data to display No results found.   1. Cough    Patient was seen and examined. Discussed measures to treat symptoms of chronic cough. Will try course of prednisone and albuterol inhaler which the patient has had success with in the past. Urged followup with primary care doctor and return with worsening. Patient verbalized understanding and agreed plan   MDM  Patient with nasal congestion and cough x2 months. She has not had fever. Cough is not worse than at baseline. Do not suspect pneumonia. Possible allergic component. Vital signs are stable, patient appears well.        Eustace Moore [place], [place] 05/20/11 0115  Medical screening examination/treatment/procedure(s) were performed by non-physician practitioner and as supervising physician I was immediately available for consultation/collaboration.  Sunnie Nielsen, MD 05/20/11 [phone]

## 2011-07-21 ENCOUNTER — Ambulatory Visit (INDEPENDENT_AMBULATORY_CARE_PROVIDER_SITE_OTHER): Payer: Medicaid Other | Admitting: Otolaryngology

## 2011-08-16 ENCOUNTER — Other Ambulatory Visit: Payer: Self-pay | Admitting: Obstetrics

## 2011-08-18 ENCOUNTER — Ambulatory Visit (INDEPENDENT_AMBULATORY_CARE_PROVIDER_SITE_OTHER): Payer: Medicaid Other | Admitting: Otolaryngology

## 2011-08-18 DIAGNOSIS — J342 Deviated nasal septum: Secondary | ICD-10-CM

## 2011-08-18 DIAGNOSIS — J343 Hypertrophy of nasal turbinates: Secondary | ICD-10-CM

## 2011-08-18 DIAGNOSIS — J310 Chronic rhinitis: Secondary | ICD-10-CM

## 2011-08-19 ENCOUNTER — Other Ambulatory Visit (INDEPENDENT_AMBULATORY_CARE_PROVIDER_SITE_OTHER): Payer: Self-pay | Admitting: Otolaryngology

## 2011-08-19 DIAGNOSIS — J342 Deviated nasal septum: Secondary | ICD-10-CM

## 2011-08-19 DIAGNOSIS — J320 Chronic maxillary sinusitis: Secondary | ICD-10-CM

## 2011-08-24 ENCOUNTER — Ambulatory Visit
Admission: RE | Admit: 2011-08-24 | Discharge: 2011-08-24 | Disposition: A | Discharge status: Home / Self Care | Payer: Medicaid Other | Source: Ambulatory Visit | Attending: Otolaryngology | Admitting: Otolaryngology

## 2011-08-24 DIAGNOSIS — J342 Deviated nasal septum: Secondary | ICD-10-CM

## 2011-08-24 DIAGNOSIS — J320 Chronic maxillary sinusitis: Secondary | ICD-10-CM

## 2011-09-02 ENCOUNTER — Emergency Department (HOSPITAL_COMMUNITY)
Admission: EM | Admit: 2011-09-02 | Discharge: 2011-09-03 | Disposition: A | Discharge status: Home / Self Care | Payer: Medicaid Other | Attending: Emergency Medicine | Admitting: Emergency Medicine

## 2011-09-02 ENCOUNTER — Encounter (HOSPITAL_COMMUNITY): Payer: Self-pay | Admitting: Emergency Medicine

## 2011-09-02 DIAGNOSIS — R142 Eructation: Secondary | ICD-10-CM | POA: Insufficient documentation

## 2011-09-02 DIAGNOSIS — K219 Gastro-esophageal reflux disease without esophagitis: Secondary | ICD-10-CM | POA: Insufficient documentation

## 2011-09-02 DIAGNOSIS — R143 Flatulence: Secondary | ICD-10-CM | POA: Insufficient documentation

## 2011-09-02 DIAGNOSIS — R141 Gas pain: Secondary | ICD-10-CM | POA: Insufficient documentation

## 2011-09-02 NOTE — ED Notes (Signed)
Pt states she has been having heartburn for the past month but it has been worse for the past few days  Pt states she has gained 8 lbs in the past week  Pt states she just doesn't feel right lately  Pt states today the heartburn has been constant  Has taken tums without relief

## 2011-09-03 ENCOUNTER — Encounter (HOSPITAL_COMMUNITY): Payer: Self-pay | Admitting: *Deleted

## 2011-09-03 LAB — CBC
MCH: 29.7 pg (ref 26.0–34.0)
MCHC: 33.7 g/dL (ref 30.0–36.0)
Platelets: 247 10*3/uL (ref 150–400)
RDW: 12.6 % (ref 11.5–15.5)

## 2011-09-03 LAB — COMPREHENSIVE METABOLIC PANEL
ALT: 14 U/L (ref 0–35)
AST: 19 U/L (ref 0–37)
Albumin: 3.8 g/dL (ref 3.5–5.2)
Calcium: 9.3 mg/dL (ref 8.4–10.5)
GFR calc Af Amer: >90 mL/min (ref >90)
Glucose, Bld: 88 mg/dL (ref 70–99)
Potassium: 3.2 mEq/L — ABNORMAL LOW (ref 3.5–5.1)
Sodium: 140 mEq/L (ref 135–145)
Total Protein: 6.8 g/dL (ref 6.0–8.3)

## 2011-09-03 MED ORDER — FAMOTIDINE 20 MG PO TABS: 20.0000 mg | ORAL_TABLET | Freq: Two times a day (BID) | ORAL | Status: DC

## 2011-09-03 MED ORDER — GI COCKTAIL ~~LOC~~
30.0000 mL | Freq: Once | ORAL | Status: AC
  Administered 2011-09-03: 30 mL via ORAL
  Filled 2011-09-03: qty 30

## 2011-09-03 NOTE — ED Provider Notes (Signed)
Medical screening examination/treatment/procedure(s) were performed by non-physician practitioner and as supervising physician I was immediately available for consultation/collaboration.   Rex Oesterle L Mesha Schamberger, MD 09/03/11 0612 

## 2011-09-03 NOTE — ED Notes (Signed)
Pt states she has persistent heartburn for "weeks" now. Pt states she has heartburn after every meal. Pt denies any use of any kind of OTC antacid medication. Pt appears to be in no apparent distress.

## 2011-09-03 NOTE — Discharge Instructions (Signed)
Diet for GERD or PUD Nutrition therapy can help ease the discomfort of gastroesophageal reflux disease (GERD) and peptic ulcer disease (PUD).  HOME CARE INSTRUCTIONS   Eat your meals slowly, in a relaxed setting.   Eat 5 to 6 small meals per day.   If a food causes distress, stop eating it for a period of time.  FOODS TO AVOID  Coffee, regular or decaffeinated.   Cola beverages, regular or low calorie.   Tea, regular or decaffeinated.   Pepper.   Cocoa.   High fat foods, including meats.   Butter, margarine, hydrogenated oil (trans fats).   Peppermint or spearmint (if you have GERD).   Fruits and vegetables if not tolerated.   Alcohol.   Nicotine (smoking or chewing). This is one of the most potent stimulants to acid production in the gastrointestinal tract.   Any food that seems to aggravate your condition.  If you have questions regarding your diet, ask your caregiver or a registered dietitian. TIPS  Lying flat may make symptoms worse. Keep the head of your bed raised 6 to 9 inches (15 to 23 cm) by using a foam wedge or blocks under the legs of the bed.   Do not lay down until 3 hours after eating a meal.   Daily physical activity may help reduce symptoms.  MAKE SURE YOU:   Understand these instructions.   Will watch your condition.   Will get help right away if you are not doing well or get worse.  Document Released: 06/13/2005 Document Revised: 06/02/2011 Document Reviewed: 04/29/2011 [facility] Patient Information 2012 [place], [place]. You have been given a medication to help control the acid production in your stomach please take this on a regular basis twice a day for one week and then back off to once a day.  After that was negative with Dr. Dulce Sellar for further evaluation of your gastric reflux disease.

## 2011-09-03 NOTE — ED Provider Notes (Signed)
History     CSN: [id number]  Arrival date & time 09/02/11  2135   First MD Initiated Contact with Patient 09/03/11 0007      Chief Complaint  Patient presents with  . Heartburn    (Consider location/radiation/quality/duration/timing/severity/associated sxs/prior treatment) HPI Comments: This 31 year old chubby female, who is one year post partum, having continuous epigastric burning sensation that radiates to mid chest.  She's been taking toms on a regular basis, but for the last couple days.  This is not relieving her discomfort  Patient is a 31 y.o. female presenting with heartburn. The history is provided by the patient.  Heartburn This is a chronic problem. The current episode started more than 1 month ago. The problem occurs constantly. The problem has been gradually worsening. Associated symptoms include abdominal pain and nausea. Pertinent negatives include no chills, fever or vomiting.    Past Medical History  Diagnosis Date  . Depression   . Kidney calculi     Past Surgical History  Procedure Date  . Polyps removed     Family History  Problem Relation Age of Onset  . Cancer Mother   . Hypertension Mother   . Hypertension Father   . Cancer Brother   . Hypertension Other     History  Substance Use Topics  . Smoking status: Never Smoker   . Smokeless tobacco: Not on file  . Alcohol Use: No    OB History    Grav Para Term Preterm Abortions TAB SAB Ect Mult Living                  Review of Systems  Constitutional: Negative for fever and chills.  Gastrointestinal: Positive for heartburn, nausea and abdominal pain. Negative for vomiting.    Allergies  Sulfa antibiotics  Home Medications   Current Outpatient Rx  Name Route Sig Dispense Refill  . BUTALBITAL-APAP-CAFFEINE 50-325-40 MG PO TABS Oral Take 2 tablets by mouth 2 (two) times daily as needed. migraine     . CLONAZEPAM 1 MG PO TABS Oral Take 1 mg by mouth at bedtime as needed.    .  ETONOGESTREL 68 MG [place] IMPL Subcutaneous Inject into the skin once.      . IBUPROFEN 400 MG PO TABS Oral Take 400 mg by mouth every 6 (six) hours as needed. For pain    . FAMOTIDINE 20 MG PO TABS Oral Take 1 tablet (20 mg total) by mouth 2 (two) times daily. 60 tablet 0    Take 1 tablet twice a day for the first 7 days and ...    BP 151/85  Pulse 88  Temp(Src) 98.5 F (36.9 C) (Oral)  Resp 16  SpO2 100%  LMP 08/18/2011  Physical Exam  Constitutional: She is oriented to person, place, and time. She appears well-developed and well-nourished.  HENT:  Head: Normocephalic.  Eyes: Pupils are equal, round, and reactive to light.  Cardiovascular: Normal rate.   Abdominal: Soft. Bowel sounds are normal. She exhibits distension. There is no tenderness. There is no rebound and no guarding.  Musculoskeletal: Normal range of motion.  Neurological: She is alert and oriented to person, place, and time.  Skin: Skin is warm.    ED Course  Procedures (including critical care time)  Labs Reviewed  COMPREHENSIVE METABOLIC PANEL - Abnormal; Notable for the following:    Potassium 3.2 (*)    All other components within normal limits  CBC   No results found.   1. GERD (gastroesophageal  reflux disease)       MDM  Will obtain CBC, and C Met  Provide patient with a GI cocktail, and then reevaluate Returned tests are normal.  She did receive relief with a GI, cocktail, we'll prescribe Pepcid 20 twice a day for one week and then daily thereafter.  Have referred patient to Dr. Dulce Sellar at [facility] for followup       Arman Filter, NP 09/03/11 1610  Arman Filter, NP 09/03/11 [phone]

## 2011-09-15 ENCOUNTER — Ambulatory Visit (INDEPENDENT_AMBULATORY_CARE_PROVIDER_SITE_OTHER): Payer: Medicaid Other | Admitting: Otolaryngology

## 2011-09-15 DIAGNOSIS — J342 Deviated nasal septum: Secondary | ICD-10-CM

## 2011-09-15 DIAGNOSIS — J343 Hypertrophy of nasal turbinates: Secondary | ICD-10-CM

## 2011-09-15 DIAGNOSIS — J310 Chronic rhinitis: Secondary | ICD-10-CM

## 2011-09-26 DIAGNOSIS — J343 Hypertrophy of nasal turbinates: Secondary | ICD-10-CM

## 2011-09-26 DIAGNOSIS — J342 Deviated nasal septum: Secondary | ICD-10-CM

## 2011-09-26 HISTORY — DX: Deviated nasal septum: J34.2

## 2011-09-26 HISTORY — DX: Hypertrophy of nasal turbinates: J34.3

## 2011-09-27 ENCOUNTER — Encounter (HOSPITAL_BASED_OUTPATIENT_CLINIC_OR_DEPARTMENT_OTHER): Payer: Self-pay | Admitting: *Deleted

## 2011-10-04 ENCOUNTER — Ambulatory Visit (HOSPITAL_BASED_OUTPATIENT_CLINIC_OR_DEPARTMENT_OTHER)
Admission: RE | Admit: 2011-10-04 | Discharge: 2011-10-04 | Disposition: A | Discharge status: Home / Self Care | Payer: Medicaid Other | Source: Ambulatory Visit | Attending: Otolaryngology | Admitting: Otolaryngology

## 2011-10-04 ENCOUNTER — Encounter (HOSPITAL_BASED_OUTPATIENT_CLINIC_OR_DEPARTMENT_OTHER): Payer: Self-pay | Admitting: Certified Registered"

## 2011-10-04 ENCOUNTER — Ambulatory Visit (HOSPITAL_BASED_OUTPATIENT_CLINIC_OR_DEPARTMENT_OTHER): Payer: Medicaid Other | Admitting: Anesthesiology

## 2011-10-04 ENCOUNTER — Encounter (HOSPITAL_BASED_OUTPATIENT_CLINIC_OR_DEPARTMENT_OTHER): Payer: Self-pay | Admitting: *Deleted

## 2011-10-04 ENCOUNTER — Encounter (HOSPITAL_BASED_OUTPATIENT_CLINIC_OR_DEPARTMENT_OTHER): Payer: Self-pay | Admitting: Anesthesiology

## 2011-10-04 ENCOUNTER — Encounter (HOSPITAL_BASED_OUTPATIENT_CLINIC_OR_DEPARTMENT_OTHER)
Admission: RE | Disposition: A | Discharge status: Home / Self Care | Payer: Self-pay | Source: Ambulatory Visit | Attending: Otolaryngology

## 2011-10-04 DIAGNOSIS — J342 Deviated nasal septum: Secondary | ICD-10-CM | POA: Insufficient documentation

## 2011-10-04 DIAGNOSIS — J343 Hypertrophy of nasal turbinates: Secondary | ICD-10-CM | POA: Insufficient documentation

## 2011-10-04 DIAGNOSIS — Z9889 Other specified postprocedural states: Secondary | ICD-10-CM

## 2011-10-04 HISTORY — DX: Headache: R51

## 2011-10-04 HISTORY — PX: NASAL SEPTOPLASTY W/ TURBINOPLASTY: SHX2070

## 2011-10-04 HISTORY — DX: Anxiety disorder, unspecified: F41.9

## 2011-10-04 HISTORY — DX: Deviated nasal septum: J34.2

## 2011-10-04 HISTORY — DX: Hypertrophy of nasal turbinates: J34.3

## 2011-10-04 HISTORY — DX: Gastro-esophageal reflux disease without esophagitis: K21.9

## 2011-10-04 HISTORY — DX: Calculus of kidney: N20.0

## 2011-10-04 SURGERY — SEPTOPLASTY, NOSE, WITH NASAL TURBINATE REDUCTION
Anesthesia: General | Site: Nose | Laterality: Bilateral | Wound class: Clean Contaminated

## 2011-10-04 MED ORDER — DEXAMETHASONE SODIUM PHOSPHATE 4 MG/ML IJ SOLN
INTRAMUSCULAR | Status: DC | PRN
  Administered 2011-10-04: 4 mg via INTRAVENOUS

## 2011-10-04 MED ORDER — FENTANYL CITRATE 0.05 MG/ML IJ SOLN
INTRAMUSCULAR | Status: DC | PRN
  Administered 2011-10-04: 100 ug via INTRAVENOUS
  Administered 2011-10-04: 25 ug via INTRAVENOUS

## 2011-10-04 MED ORDER — LIDOCAINE HCL (CARDIAC) 20 MG/ML IV SOLN
INTRAVENOUS | Status: DC | PRN
  Administered 2011-10-04: 100 mg via INTRAVENOUS

## 2011-10-04 MED ORDER — PROPOFOL 10 MG/ML IV EMUL
INTRAVENOUS | Status: DC | PRN
  Administered 2011-10-04: 200 mg via INTRAVENOUS

## 2011-10-04 MED ORDER — HYDROMORPHONE HCL PF 1 MG/ML IJ SOLN
0.2500 mg | INTRAMUSCULAR | Status: DC | PRN
  Administered 2011-10-04 (×3): 0.5 mg via INTRAVENOUS

## 2011-10-04 MED ORDER — SUCCINYLCHOLINE CHLORIDE 20 MG/ML IJ SOLN
INTRAMUSCULAR | Status: DC | PRN
  Administered 2011-10-04: 100 mg via INTRAVENOUS

## 2011-10-04 MED ORDER — ACETAMINOPHEN 10 MG/ML IV SOLN
INTRAVENOUS | Status: DC | PRN
  Administered 2011-10-04: 1000 mg via INTRAVENOUS

## 2011-10-04 MED ORDER — LACTATED RINGERS IV SOLN
INTRAVENOUS | Status: DC
  Administered 2011-10-04 (×3): via INTRAVENOUS

## 2011-10-04 MED ORDER — OXYMETAZOLINE HCL 0.05 % NA SOLN
NASAL | Status: DC | PRN
  Administered 2011-10-04: 1 via NASAL

## 2011-10-04 MED ORDER — MIDAZOLAM HCL 5 MG/5ML IJ SOLN
INTRAMUSCULAR | Status: DC | PRN
  Administered 2011-10-04: 2 mg via INTRAVENOUS

## 2011-10-04 MED ORDER — MUPIROCIN 2 % EX OINT
TOPICAL_OINTMENT | CUTANEOUS | Status: DC | PRN
  Administered 2011-10-04: 1 via NASAL

## 2011-10-04 MED ORDER — ONDANSETRON HCL 4 MG/2ML IJ SOLN
INTRAMUSCULAR | Status: DC | PRN
  Administered 2011-10-04: 4 mg via INTRAVENOUS

## 2011-10-04 MED ORDER — LIDOCAINE-EPINEPHRINE 1 %-1:100000 IJ SOLN
INTRAMUSCULAR | Status: DC | PRN
  Administered 2011-10-04: 4 mL

## 2011-10-04 MED ORDER — MORPHINE SULFATE 4 MG/ML IJ SOLN: 0.0500 mg/kg | INTRAMUSCULAR | Status: DC | PRN

## 2011-10-04 MED ORDER — DROPERIDOL 2.5 MG/ML IJ SOLN
INTRAMUSCULAR | Status: DC | PRN
  Administered 2011-10-04: 0.625 mg via INTRAVENOUS

## 2011-10-04 MED ORDER — CEFAZOLIN SODIUM 1-5 GM-% IV SOLN
INTRAVENOUS | Status: DC | PRN
  Administered 2011-10-04: 2 g via INTRAVENOUS

## 2011-10-04 MED ORDER — METOCLOPRAMIDE HCL 5 MG/ML IJ SOLN: 10.0000 mg | Freq: Once | INTRAMUSCULAR | Status: DC | PRN

## 2011-10-04 SURGICAL SUPPLY — 35 items
ATTRACTOMAT 16X20 MAGNETIC DRP (DRAPES) ×2 IMPLANT
BLADE SURG 15 STRL LF DISP TIS (BLADE) IMPLANT
BLADE SURG 15 STRL SS (BLADE)
CANISTER SUCTION 1200CC (MISCELLANEOUS) ×2 IMPLANT
CLOTH BEACON ORANGE TIMEOUT ST (SAFETY) ×2 IMPLANT
COAGULATOR SUCT 8FR VV (MISCELLANEOUS) ×2 IMPLANT
DECANTER SPIKE VIAL GLASS SM (MISCELLANEOUS) IMPLANT
DRSG NASOPORE 8CM (GAUZE/BANDAGES/DRESSINGS) IMPLANT
ELECT REM PT RETURN 9FT ADLT (ELECTROSURGICAL) ×2
ELECTRODE REM PT RTRN 9FT ADLT (ELECTROSURGICAL) ×1 IMPLANT
GLOVE BIO SURGEON STRL SZ 6.5 (GLOVE) ×2 IMPLANT
GLOVE BIO SURGEON STRL SZ7.5 (GLOVE) ×2 IMPLANT
GLOVE INDICATOR 6.5 STRL GRN (GLOVE) ×2 IMPLANT
GLOVE SKINSENSE NS SZ6.5 (GLOVE) ×1
GLOVE SKINSENSE STRL SZ6.5 (GLOVE) ×1 IMPLANT
GOWN PREVENTION PLUS XLARGE (GOWN DISPOSABLE) ×6 IMPLANT
NEEDLE HYPO 25X1 1.5 SAFETY (NEEDLE) ×2 IMPLANT
NS IRRIG 1000ML POUR BTL (IV SOLUTION) ×2 IMPLANT
PACK BASIN DAY SURGERY FS (CUSTOM PROCEDURE TRAY) ×2 IMPLANT
PACK ENT DAY SURGERY (CUSTOM PROCEDURE TRAY) ×2 IMPLANT
SLEEVE SCD COMPRESS KNEE MED (MISCELLANEOUS) ×2 IMPLANT
SOLUTION BUTLER CLEAR DIP (MISCELLANEOUS) ×2 IMPLANT
SPLINT NASAL DOYLE BI-VL (GAUZE/BANDAGES/DRESSINGS) ×2 IMPLANT
SPONGE GAUZE 2X2 8PLY STRL LF (GAUZE/BANDAGES/DRESSINGS) ×2 IMPLANT
SPONGE NEURO XRAY DETECT 1X3 (DISPOSABLE) ×2 IMPLANT
SUT CHROMIC 4 0 P 3 18 (SUTURE) ×2 IMPLANT
SUT PLAIN 4 0 ~~LOC~~ 1 (SUTURE) ×2 IMPLANT
SUT PROLENE 3 0 PS 2 (SUTURE) ×2 IMPLANT
SUT VIC AB 4-0 P-3 18XBRD (SUTURE) IMPLANT
SUT VIC AB 4-0 P3 18 (SUTURE)
TOWEL OR 17X24 6PK STRL BLUE (TOWEL DISPOSABLE) ×2 IMPLANT
TUBE SALEM SUMP 12R W/ARV (TUBING) IMPLANT
TUBE SALEM SUMP 16 FR W/ARV (TUBING) ×2 IMPLANT
WATER STERILE IRR 1000ML POUR (IV SOLUTION) IMPLANT
YANKAUER SUCT BULB TIP NO VENT (SUCTIONS) ×2 IMPLANT

## 2011-10-04 NOTE — H&P (Signed)
H&P Update  Pt's original H&P dated 09/15/11 reviewed and placed in chart (to be scanned).  I personally examined the patient today.  No change in health. Proceed with septoplasty and bilateral partial inferior turbinate resection.

## 2011-10-04 NOTE — Brief Op Note (Signed)
10/04/2011  12:06 PM  PATIENT:  Christina Fox  31 y.o. female  PRE-OPERATIVE DIAGNOSIS:  deviated septum, bilateral turbinate hypertrophy  POST-OPERATIVE DIAGNOSIS:  deviated septum, bilateral turbinate hypertrophy  PROCEDURE:  Procedure(s) (LRB): 1) NASAL SEPTOPLASTY 2) Bilateral partial inferior turbinate resection  SURGEON:  Surgeon(s) and Role:    * Sui W Jad Johansson, MD - Primary  PHYSICIAN ASSISTANT:   ASSISTANTS: none   ANESTHESIA:   general  EBL:  Total I/O In: 1900 [I.V.:1900] Out: 200 [Blood:200]  BLOOD ADMINISTERED:none  DRAINS: none   LOCAL MEDICATIONS USED:  NONE  SPECIMEN:  No Specimen  DISPOSITION OF SPECIMEN:  N/A  COUNTS:  YES  TOURNIQUET:  * No tourniquets in log *  DICTATION: .Note written in EPIC  PLAN OF CARE: Discharge to home after PACU  PATIENT DISPOSITION:  PACU - hemodynamically stable.   Delay start of Pharmacological VTE agent (>24hrs) due to surgical blood loss or risk of bleeding: not applicable

## 2011-10-04 NOTE — Discharge Instructions (Addendum)

## 2011-10-04 NOTE — Anesthesia Procedure Notes (Signed)
Procedure Name: Intubation Date/Time: 10/04/2011 10:48 AM Performed by: Verlan Friends Pre-anesthesia Checklist: Patient identified, Emergency Drugs available, Suction available, Patient being monitored and Timeout performed Patient Re-evaluated:Patient Re-evaluated prior to inductionOxygen Delivery Method: Circle System Utilized Preoxygenation: Pre-oxygenation with 100% oxygen Intubation Type: IV induction Ventilation: Mask ventilation without difficulty Laryngoscope Size: Miller and 3 Grade View: Grade II Tube type: Oral Number of attempts: 1 Airway Equipment and Method: stylet Placement Confirmation: ETT inserted through vocal cords under direct vision,  positive ETCO2 and breath sounds checked- equal and bilateral Tube secured with: Tape Dental Injury: Teeth and Oropharynx as per pre-operative assessment

## 2011-10-04 NOTE — Op Note (Signed)
DATE OF PROCEDURE: 10/04/2011  OPERATIVE REPORT   SURGEON: Newman Pies, MD   PREOPERATIVE DIAGNOSES:  1. Severe nasal septal deviation.  2. Bilateral inferior turbinate hypertrophy.  3. Chronic nasal obstruction.  POSTOPERATIVE DIAGNOSES:  1. Severe nasal septal deviation.  2. Bilateral inferior turbinate hypertrophy.  3. Chronic nasal obstruction.  PROCEDURE PERFORMED:  1. Septoplasty.  2. Bilateral partial inferior turbinate resection.   ANESTHESIA: General endotracheal tube anesthesia.   COMPLICATIONS: None.   ESTIMATED BLOOD LOSS: Less than xxx mL.   INDICATION FOR PROCEDURE: Christina Fox is a 31 y.o. female with a history of chronic nasal obstruction. The patient was  treated with antihistamine, decongestant, steroid nasal spray, and systemic steroids. However, the patient continues to be symptomatic. On examination, the patient was noted to have bilateral severe inferior turbinate hypertrophy and significant nasal septal deviation, causing significant nasal obstruction. Based on the above findings, the decision was made for the patient to undergo the above-stated procedure. The risks, benefits, alternatives, and details of the procedure were discussed with the patient. Questions were invited and answered. Informed consent was obtained.   DESCRIPTION OF PROCEDURE: The patient was taken to the operating room and placed supine on the operating table. General endotracheal tube anesthesia was administered by the anesthesiologist. The patient was positioned, and prepped and draped in the standard fashion for nasal surgery. Pledgets soaked with Afrin were placed in both nasal cavities for decongestion. The pledgets were subsequently removed. The above mentioned severe septal deviation was again noted. 1% lidocaine with 1:100,000 epinephrine was injected onto the nasal septum bilaterally. A hemitransfixion incision was made on the left side. The mucosal flap was carefully elevated on the  left side. A cartilaginous incision was made 1 cm superior to the caudal margin of the nasal septum. Mucosal flap was also elevated on the right side in the similar fashion. It should be noted that due to the severe septal deviation, the deviated portion of the cartilaginous and bony septum had to be removed in piecemeal fashion. Once the deviated portions were removed, a straight midline septum was achieved. The septum was then quilted with 4-0 plain gut sutures. The hemitransfixion incision was closed with interrupted 4-0 chromic sutures. Doyle splints were applied.   Prior to the [facility] splint application, the inferior one half of both hypertrophied inferior turbinate was crossclamped with a Kelly clamp. The inferior one half of each inferior turbinate was then resected with a pair of cross cutting scissors. Hemostasis was achieved with a suction cautery device.   The care of the patient was turned over to the anesthesiologist. The patient was awakened from anesthesia without difficulty. The patient was extubated and transferred to the recovery room in good condition.   OPERATIVE FINDINGS: Severe nasal septal deviation and bilateral inferior turbinate hypertrophy.   SPECIMEN: None.   FOLLOWUP CARE: The patient be discharged home once she is awake and alert. The patient will be placed on Vicodin 1-2 tablets p.o. q.6 hours p.r.n. pain, and amoxicillin 875 mg p.o. b.i.d. for 5 days. The patient will follow up in my office in approximately 1 week for splint removal.   Drae Mitzel Philomena Doheny, MD

## 2011-10-04 NOTE — Anesthesia Postprocedure Evaluation (Signed)
Anesthesia Post Note  Patient: Christina Fox  Procedure(s) Performed: Procedure(s) (LRB): NASAL SEPTOPLASTY WITH TURBINATE REDUCTION (Bilateral)  Anesthesia type: General  Patient location: PACU  Post pain: Pain level controlled  Post assessment: Patient's Cardiovascular Status Stable  Last Vitals:  Filed Vitals:   10/04/11 1315  BP: 146/81  Pulse: 98  Temp:   Resp:     Post vital signs: Reviewed and stable  Level of consciousness: alert  Complications: No apparent anesthesia complications

## 2011-10-04 NOTE — Anesthesia Preprocedure Evaluation (Addendum)
Anesthesia Evaluation  Patient identified by MRN, date of birth, ID band Patient awake    Reviewed: Allergy & Precautions, H&P , NPO status , Patient's Chart, lab work & pertinent test results, reviewed documented beta blocker date and time   History of Anesthesia Complications Negative for: history of anesthetic complications  Airway Mallampati: II TM Distance: >3 FB Neck ROM: full    Dental   Pulmonary neg pulmonary ROS,          Cardiovascular negative cardio ROS      Neuro/Psych  Headaches, negative psych ROS   GI/Hepatic Neg liver ROS, GERD-  Medicated,  Endo/Other  negative endocrine ROS  Renal/GU negative Renal ROS  negative genitourinary   Musculoskeletal   Abdominal   Peds  Hematology negative hematology ROS (+)   Anesthesia Other Findings See surgeon's H&P   Reproductive/Obstetrics negative OB ROS                          Anesthesia Physical Anesthesia Plan  ASA: II  Anesthesia Plan: General   Post-op Pain Management:    Induction: Intravenous  Airway Management Planned: Oral ETT  Additional Equipment:   Intra-op Plan:   Post-operative Plan: Extubation in OR  Informed Consent: I have reviewed the patients History and Physical, chart, labs and discussed the procedure including the risks, benefits and alternatives for the proposed anesthesia with the patient or authorized representative who has indicated his/her understanding and acceptance.     Plan Discussed with: CRNA and Surgeon  Anesthesia Plan Comments:         Anesthesia Quick Evaluation

## 2011-10-04 NOTE — Transfer of Care (Signed)
Immediate Anesthesia Transfer of Care Note  Patient: Christina Fox  Procedure(s) Performed: Procedure(s) (LRB): NASAL SEPTOPLASTY WITH TURBINATE REDUCTION (Bilateral)  Patient Location: PACU  Anesthesia Type: General  Level of Consciousness: awake, alert , oriented and patient cooperative  Airway & Oxygen Therapy: Patient Spontanous Breathing and Patient connected to face mask oxygen  Post-op Assessment: Report given to PACU RN and Post -op Vital signs reviewed and stable  Post vital signs: Reviewed and stable  Complications: No apparent anesthesia complications

## 2011-10-05 ENCOUNTER — Encounter (HOSPITAL_BASED_OUTPATIENT_CLINIC_OR_DEPARTMENT_OTHER): Payer: Self-pay | Admitting: Otolaryngology

## 2012-05-15 ENCOUNTER — Emergency Department (HOSPITAL_COMMUNITY)
Admission: EM | Admit: 2012-05-15 | Discharge: 2012-05-15 | Disposition: A | Discharge status: Home / Self Care | Payer: Medicaid Other | Attending: Emergency Medicine | Admitting: Emergency Medicine

## 2012-05-15 ENCOUNTER — Encounter (HOSPITAL_COMMUNITY): Payer: Self-pay | Admitting: Emergency Medicine

## 2012-05-15 DIAGNOSIS — Z79899 Other long term (current) drug therapy: Secondary | ICD-10-CM | POA: Insufficient documentation

## 2012-05-15 DIAGNOSIS — B9689 Other specified bacterial agents as the cause of diseases classified elsewhere: Secondary | ICD-10-CM | POA: Insufficient documentation

## 2012-05-15 DIAGNOSIS — Z8719 Personal history of other diseases of the digestive system: Secondary | ICD-10-CM | POA: Insufficient documentation

## 2012-05-15 DIAGNOSIS — Z87442 Personal history of urinary calculi: Secondary | ICD-10-CM | POA: Insufficient documentation

## 2012-05-15 DIAGNOSIS — N760 Acute vaginitis: Secondary | ICD-10-CM | POA: Insufficient documentation

## 2012-05-15 DIAGNOSIS — A499 Bacterial infection, unspecified: Secondary | ICD-10-CM | POA: Insufficient documentation

## 2012-05-15 DIAGNOSIS — F411 Generalized anxiety disorder: Secondary | ICD-10-CM | POA: Insufficient documentation

## 2012-05-15 DIAGNOSIS — Z8775 Personal history of (corrected) congenital malformations of respiratory system: Secondary | ICD-10-CM | POA: Insufficient documentation

## 2012-05-15 LAB — CBC WITH DIFFERENTIAL/PLATELET
Basophils Absolute: 0.1 10*3/uL (ref 0.0–0.1)
Basophils Relative: 1 % (ref 0–1)
Eosinophils Absolute: 0.7 10*3/uL (ref 0.0–0.7)
Eosinophils Relative: 7 % — ABNORMAL HIGH (ref 0–5)
MCH: 30.2 pg (ref 26.0–34.0)
MCV: 87.1 fL (ref 78.0–100.0)
Neutrophils Relative %: 64 % (ref 43–77)
Platelets: 276 10*3/uL (ref 150–400)
RDW: 12.4 % (ref 11.5–15.5)

## 2012-05-15 LAB — BASIC METABOLIC PANEL
Calcium: 8.7 mg/dL (ref 8.4–10.5)
GFR calc Af Amer: >90 mL/min (ref >90)
GFR calc non Af Amer: >90 mL/min (ref >90)
Potassium: 3.5 mEq/L (ref 3.5–5.1)
Sodium: 139 mEq/L (ref 135–145)

## 2012-05-15 LAB — URINALYSIS, ROUTINE W REFLEX MICROSCOPIC
Glucose, UA: NEGATIVE mg/dL
Hgb urine dipstick: NEGATIVE
Ketones, ur: NEGATIVE mg/dL
Protein, ur: NEGATIVE mg/dL
Urobilinogen, UA: 1 mg/dL (ref 0.0–1.0)

## 2012-05-15 LAB — WET PREP, GENITAL
Trich, Wet Prep: NONE SEEN
Yeast Wet Prep HPF POC: NONE SEEN

## 2012-05-15 LAB — PREGNANCY, URINE: Preg Test, Ur: NEGATIVE

## 2012-05-15 LAB — URINE MICROSCOPIC-ADD ON

## 2012-05-15 MED ORDER — SODIUM CHLORIDE 0.9 % IV BOLUS (SEPSIS)
1000.0000 mL | Freq: Once | INTRAVENOUS | Status: AC
  Administered 2012-05-15: 1000 mL via INTRAVENOUS

## 2012-05-15 MED ORDER — ONDANSETRON HCL 4 MG/2ML IJ SOLN
4.0000 mg | Freq: Once | INTRAMUSCULAR | Status: AC
  Administered 2012-05-15: 4 mg via INTRAVENOUS
  Filled 2012-05-15: qty 2

## 2012-05-15 MED ORDER — HYDROCODONE-ACETAMINOPHEN 5-500 MG PO TABS: 1.0000 | ORAL_TABLET | Freq: Four times a day (QID) | ORAL | Status: DC | PRN

## 2012-05-15 MED ORDER — ONDANSETRON HCL 4 MG PO TABS: 4.0000 mg | ORAL_TABLET | Freq: Four times a day (QID) | ORAL | Status: DC

## 2012-05-15 MED ORDER — METRONIDAZOLE 500 MG PO TABS
500.0000 mg | ORAL_TABLET | Freq: Once | ORAL | Status: AC
  Administered 2012-05-15: 500 mg via ORAL
  Filled 2012-05-15: qty 9

## 2012-05-15 MED ORDER — MORPHINE SULFATE 4 MG/ML IJ SOLN
4.0000 mg | Freq: Once | INTRAMUSCULAR | Status: AC
  Administered 2012-05-15: 4 mg via INTRAVENOUS
  Filled 2012-05-15: qty 1

## 2012-05-15 MED ORDER — METRONIDAZOLE 500 MG PO TABS: 500.0000 mg | ORAL_TABLET | Freq: Two times a day (BID) | ORAL | Status: DC

## 2012-05-15 NOTE — ED Provider Notes (Signed)
History     CSN: [id number]  Arrival date & time 05/15/12  1528   First MD Initiated Contact with Patient 05/15/12 1604      Chief Complaint  Patient presents with  . Flank Pain    (Consider location/radiation/quality/duration/timing/severity/associated sxs/prior treatment) HPI  Pt comes to the ER with complaints of Left flank/low back pain. She has a history of kidney stones, UTI and BV. She is unsure of what this feels like. The pain started on Sunday with no dysuria, N/V/D, or vaginal discharge. She has not had her period in two months and her bowel movements have been normal. She has tried Motrin without relief. She denies having weakness, fevers, chills. nad vss  Past Medical History  Diagnosis Date  . Headache     sinus  . GERD (gastroesophageal reflux disease)   . Kidney stones     no current problem  . Anxiety   . Deviated septum 09/2011  . 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  . Complication of anesthesia     states small mouth    Past Surgical History  Procedure Date  . Polyps removed     colon  . Wisdom tooth extraction   . Nasal septoplasty w/ turbinoplasty 10/04/2011    Procedure: NASAL SEPTOPLASTY WITH TURBINATE REDUCTION;  Surgeon: Darletta Moll, MD;  [facility];  Service: ENT;  Laterality: Bilateral;    Family History  Problem Relation Age of Onset  . Cancer Mother   . Hypertension Mother   . Hypertension Father   . Cancer Brother   . Hypertension Other     History  Substance Use Topics  . Smoking status: Never Smoker   . Smokeless tobacco: Never Used  . Alcohol Use: No    OB History    Grav Para Term Preterm Abortions TAB SAB Ect Mult Living                  Review of Systems  Review of Systems  Gen: no weight loss, fevers, chills, night sweats t  Neck: no neck pain  Lungs:No wheezing, coughing or hemoptysis CV: no chest pain, palpitations, dependent edema or orthopnea  Abd: no abdominal pain, nausea,  vomiting  GU: no dysuria or gross hematuria  MSK:  Left flank pain Neuro: no headache, no focal neurologic deficits  Skin: no abnormalities Psyche: negative.   Allergies  Sulfa antibiotics  Home Medications   Current Outpatient Rx  Name  Route  Sig  Dispense  Refill  . CLONAZEPAM 1 MG PO TABS   Oral   Take 1 mg by mouth daily. AM         . FAMOTIDINE 20 MG PO TABS   Oral   Take 1 tablet (20 mg total) by mouth 2 (two) times daily.   60 tablet   0     Take 1 tablet twice a day for the first 7 days and ...   . IBUPROFEN 200 MG PO TABS   Oral   Take 400 mg by mouth every 8 (eight) hours as needed. For pain.         Marland Kitchen ETONOGESTREL 68 MG [place] IMPL   Subcutaneous   Inject into the skin once.             BP 115/55  Pulse 68  Temp 97.4 F (36.3 C) (Oral)  Resp 12  SpO2 96%  LMP 02/13/2012  Physical Exam  Nursing note and vitals  reviewed. Constitutional: She appears well-developed and well-nourished. No distress.  HENT:  Head: Normocephalic and atraumatic.  Eyes: Pupils are equal, round, and reactive to light.  Neck: Normal range of motion. Neck supple.  Cardiovascular: Normal rate and regular rhythm.   Pulmonary/Chest: Effort normal.  Abdominal: Soft. There is no tenderness (no tenderness to palpation of abdomen, back, lflank or suprapubic tenderness). There is no rebound and no guarding.  Genitourinary: Uterus normal. Cervix exhibits discharge. Cervix exhibits no motion tenderness and no friability. No erythema, tenderness or bleeding around the vagina. No foreign body around the vagina. Vaginal discharge found.  Neurological: She is alert.  Skin: Skin is warm and dry.    ED Course  Procedures (including critical care time)  Labs Reviewed  URINALYSIS, ROUTINE W REFLEX MICROSCOPIC - Abnormal; Notable for the following:    APPearance CLOUDY (*)     Leukocytes, UA SMALL (*)     All other components within normal limits  URINE MICROSCOPIC-ADD ON - Abnormal;  Notable for the following:    Squamous Epithelial / LPF MANY (*)     Bacteria, UA MANY (*)     All other components within normal limits  CBC WITH DIFFERENTIAL - Abnormal; Notable for the following:    HCT 35.8 (*)     Eosinophils Relative 7 (*)     All other components within normal limits  BASIC METABOLIC PANEL - Abnormal; Notable for the following:    Glucose, Bld 101 (*)     All other components within normal limits  WET PREP, GENITAL - Abnormal; Notable for the following:    Clue Cells Wet Prep HPF POC MANY (*)     WBC, Wet Prep HPF POC MANY (*)     All other components within normal limits  PREGNANCY, URINE  URINE CULTURE  GC/CHLAMYDIA PROBE AMP   No results found.   1. Bacterial vaginosis       MDM  Pt has been with the same partner for 4 years. GC cultures sent out, will call pt if positive. No fevers, chills, N/V/D, i do not believe that she needs inpatient treatment. Abx, pain and nausea meds Rx. [facility] or return to the ED as needed. All other labs and work-up unremarkable.  Pt has been advised of the symptoms that warrant their return to the ED. Patient has voiced understanding and has agreed to follow-up with the PCP or specialist.         Dorthula Matas, PA 05/15/12 1819

## 2012-05-15 NOTE — ED Provider Notes (Signed)
Medical screening examination/treatment/procedure(s) were performed by non-physician practitioner and as supervising physician I was immediately available for consultation/collaboration.   Celene Kras, MD 05/15/12 [phone]

## 2012-05-15 NOTE — ED Notes (Addendum)
Pt c/o left flank pain since Sunday.  Denies urinary sx, NVD.  States that she has not had a period in 2 months.

## 2012-05-16 LAB — GC/CHLAMYDIA PROBE AMP: GC Probe RNA: NEGATIVE

## 2012-05-17 LAB — URINE CULTURE
Colony Count: NO GROWTH
Culture: NO GROWTH

## 2012-06-01 ENCOUNTER — Emergency Department (HOSPITAL_COMMUNITY)
Admission: EM | Admit: 2012-06-01 | Discharge: 2012-06-01 | Disposition: A | Discharge status: Home / Self Care | Payer: Medicaid Other | Attending: Emergency Medicine | Admitting: Emergency Medicine

## 2012-06-01 ENCOUNTER — Encounter (HOSPITAL_COMMUNITY): Payer: Self-pay | Admitting: *Deleted

## 2012-06-01 ENCOUNTER — Emergency Department (HOSPITAL_COMMUNITY): Payer: Medicaid Other

## 2012-06-01 DIAGNOSIS — K219 Gastro-esophageal reflux disease without esophagitis: Secondary | ICD-10-CM | POA: Insufficient documentation

## 2012-06-01 DIAGNOSIS — W278XXA Contact with other nonpowered hand tool, initial encounter: Secondary | ICD-10-CM | POA: Insufficient documentation

## 2012-06-01 DIAGNOSIS — Y929 Unspecified place or not applicable: Secondary | ICD-10-CM | POA: Insufficient documentation

## 2012-06-01 DIAGNOSIS — Z23 Encounter for immunization: Secondary | ICD-10-CM | POA: Insufficient documentation

## 2012-06-01 DIAGNOSIS — Z8709 Personal history of other diseases of the respiratory system: Secondary | ICD-10-CM | POA: Insufficient documentation

## 2012-06-01 DIAGNOSIS — S61409A Unspecified open wound of unspecified hand, initial encounter: Secondary | ICD-10-CM | POA: Insufficient documentation

## 2012-06-01 DIAGNOSIS — R51 Headache: Secondary | ICD-10-CM | POA: Insufficient documentation

## 2012-06-01 DIAGNOSIS — Y939 Activity, unspecified: Secondary | ICD-10-CM | POA: Insufficient documentation

## 2012-06-01 DIAGNOSIS — S61431A Puncture wound without foreign body of right hand, initial encounter: Secondary | ICD-10-CM

## 2012-06-01 DIAGNOSIS — Z79899 Other long term (current) drug therapy: Secondary | ICD-10-CM | POA: Insufficient documentation

## 2012-06-01 DIAGNOSIS — F411 Generalized anxiety disorder: Secondary | ICD-10-CM | POA: Insufficient documentation

## 2012-06-01 DIAGNOSIS — Z87442 Personal history of urinary calculi: Secondary | ICD-10-CM | POA: Insufficient documentation

## 2012-06-01 MED ORDER — TETANUS-DIPHTH-ACELL PERTUSSIS 5-2.5-18.5 LF-MCG/0.5 IM SUSP
0.5000 mL | Freq: Once | INTRAMUSCULAR | Status: AC
  Administered 2012-06-01: 0.5 mL via INTRAMUSCULAR
  Filled 2012-06-01: qty 0.5

## 2012-06-01 MED ORDER — AMOXICILLIN-POT CLAVULANATE 875-125 MG PO TABS: 1.0000 | ORAL_TABLET | Freq: Two times a day (BID) | ORAL | Status: DC

## 2012-06-01 MED ORDER — BACITRACIN ZINC 500 UNIT/GM EX OINT: TOPICAL_OINTMENT | Freq: Two times a day (BID) | CUTANEOUS | Status: DC

## 2012-06-01 MED ORDER — BACITRACIN ZINC 500 UNIT/GM EX OINT
1.0000 "application " | TOPICAL_OINTMENT | Freq: Two times a day (BID) | CUTANEOUS | Status: DC
  Administered 2012-06-01: 1 via TOPICAL
  Filled 2012-06-01: qty 0.9

## 2012-06-01 MED ORDER — NAPROXEN 500 MG PO TABS: 500.0000 mg | ORAL_TABLET | Freq: Two times a day (BID) | ORAL | Status: DC

## 2012-06-01 MED ORDER — KETOROLAC TROMETHAMINE 60 MG/2ML IM SOLN
60.0000 mg | Freq: Once | INTRAMUSCULAR | Status: AC
  Administered 2012-06-01: 60 mg via INTRAMUSCULAR
  Filled 2012-06-01: qty 2

## 2012-06-01 MED ORDER — OXYCODONE-ACETAMINOPHEN 5-325 MG PO TABS: 1.0000 | ORAL_TABLET | ORAL | Status: DC | PRN

## 2012-06-01 NOTE — ED Provider Notes (Signed)
History     CSN: [id number]  Arrival date & time 06/01/12  0022   First MD Initiated Contact with Patient 06/01/12 0259      Chief Complaint  Patient presents with  . Laceration    (Consider location/radiation/quality/duration/timing/severity/associated sxs/prior treatment) HPI Comments: 31 year old female presents after sustaining a small laceration to the volar surface of the base of the fourth digit at the crease at the metacarpal phalangeal joint. She states this was acute in onset, approximately 5 hours ago, the pain has been persistent, worse with flexion and extension of the finger which is the right fourth finger. She does not know when her last tetanus shot was, and denies having any foreign bodies in the wound, she did initially clean the wound a small amount and has had a bandage on it since that time.  Patient is a 31 y.o. female presenting with skin laceration. The history is provided by the patient.  Laceration     Past Medical History  Diagnosis Date  . Headache     sinus  . GERD (gastroesophageal reflux disease)   . Kidney stones     no current problem  . Anxiety   . Deviated septum 09/2011  . 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  . Complication of anesthesia     states small mouth    Past Surgical History  Procedure Date  . Polyps removed     colon  . Wisdom tooth extraction   . Nasal septoplasty w/ turbinoplasty 10/04/2011    Procedure: NASAL SEPTOPLASTY WITH TURBINATE REDUCTION;  Surgeon: Darletta Moll, MD;  Location: [facility];  Service: ENT;  Laterality: Bilateral;    Family History  Problem Relation Age of Onset  . Cancer Mother   . Hypertension Mother   . Hypertension Father   . Cancer Brother   . Hypertension Other     History  Substance Use Topics  . Smoking status: Never Smoker   . Smokeless tobacco: Never Used  . Alcohol Use: No    OB History    Grav Para Term Preterm Abortions TAB SAB Ect Mult Living            Review of Systems  Constitutional: Negative for fever.  Musculoskeletal: Negative for joint swelling.  Skin: Positive for wound.  Neurological: Negative for weakness and numbness.    Allergies  Sulfa antibiotics  Home Medications   Current Outpatient Rx  Name  Route  Sig  Dispense  Refill  . CLONAZEPAM 1 MG PO TABS   Oral   Take 1 mg by mouth daily. AM         . ETONOGESTREL 68 MG [place] IMPL   Subcutaneous   Inject into the skin once.           Marland Kitchen FAMOTIDINE 20 MG PO TABS   Oral   Take 1 tablet (20 mg total) by mouth 2 (two) times daily.   60 tablet   0     Take 1 tablet twice a day for the first 7 days and ...   . AMOXICILLIN-POT CLAVULANATE 875-125 MG PO TABS   Oral   Take 1 tablet by mouth every 12 (twelve) hours.   14 tablet   0   . BACITRACIN ZINC 500 UNIT/GM EX OINT   Topical   Apply topically 2 (two) times daily.   120 g   0   . IBUPROFEN 200 MG PO TABS   Oral  Take 400 mg by mouth every 8 (eight) hours as needed. For pain.         Marland Kitchen NAPROXEN 500 MG PO TABS   Oral   Take 1 tablet (500 mg total) by mouth 2 (two) times daily with a meal.   30 tablet   0   . OXYCODONE-ACETAMINOPHEN 5-325 MG PO TABS   Oral   Take 1 tablet by mouth every 4 (four) hours as needed for pain.   20 tablet   0     BP 134/62  Pulse 94  Temp 98 F (36.7 C) (Oral)  Resp 20  SpO2 100%  LMP 02/13/2012  Physical Exam  Nursing note and vitals reviewed. Constitutional: She appears well-developed and well-nourished. No distress.  HENT:  Head: Normocephalic and atraumatic.  Eyes: Conjunctivae normal are normal. Right eye exhibits no discharge. Left eye exhibits no discharge. No scleral icterus.  Neck: Normal range of motion. Neck supple.  Cardiovascular: Intact distal pulses.   Pulmonary/Chest: Effort normal.  Musculoskeletal: Normal range of motion. She exhibits tenderness. She exhibits no edema.       Focal tenderness to palpation at the base of the  ring finger of the right hand on the volar surface. There is a 0.5 cm puncture wound at this area, there is no visible foreign bodies in the bottom of the wound, there is no redness, swelling or drainage from the wound, there is no bleeding. The patient has decreased range of motion at the distal interphalangeal joint of the fourth finger of the right hand, she is unable to flex at that joint, she can flex at the MCP and the PIP of the right fourth finger.  Neurological: She is alert. Coordination normal.  Skin: Skin is warm and dry.       Laceration as described of right fourth finger  Psychiatric: She has a normal mood and affect. Her behavior is normal.    ED Course  Procedures (including critical care time)  Labs Reviewed - No data to display Dg Hand Complete Right  06/01/2012  *RADIOLOGY REPORT*  Clinical Data: Puncture wound to the base of the right ring finger.  RIGHT HAND - COMPLETE 3+ VIEW  Comparison: None.  Findings: There is no evidence of fracture or dislocation.  The joint spaces are preserved; the known soft tissue puncture wound is not well characterized on radiograph.  No radiopaque foreign bodies are seen.  The carpal rows are intact, and demonstrate normal alignment.  IMPRESSION: No evidence of fracture or dislocation.  No radiopaque foreign bodies seen.   Original Report Authenticated By: Tonia Ghent, M.D.      1. Puncture wound of right hand       MDM  The patient has a puncture wound to the base of the fourth digit, she does have a tendon deficit though it could be limited by pain, she will be splinted, followup with hand Dr., antibiotics, tetanus, thorough irrigation an antibiotic cream. X-ray pending at this time.  Wound irrigated thoroughly, pt has no FB seen on xray - can f/u with hand.  abx rx and pain rx.   Care discussed with Dr. Amanda Pea - will see in office today.  Vida Roller, MD 06/01/12 [phone]

## 2012-06-01 NOTE — ED Notes (Signed)
Pt in c/o laceration to right ring finger, states she cut it on a rusty hammer, CMS intact.

## 2012-06-01 NOTE — ED Notes (Signed)
drsg  To hand applied condition stable pt to follow up with hand surgeon as instructed

## 2012-07-10 ENCOUNTER — Encounter (HOSPITAL_COMMUNITY): Payer: Self-pay | Admitting: Emergency Medicine

## 2012-07-10 ENCOUNTER — Emergency Department (HOSPITAL_COMMUNITY)
Admission: EM | Admit: 2012-07-10 | Discharge: 2012-07-11 | Disposition: A | Discharge status: Home / Self Care | Payer: Medicaid Other | Attending: Emergency Medicine | Admitting: Emergency Medicine

## 2012-07-10 DIAGNOSIS — M545 Low back pain, unspecified: Secondary | ICD-10-CM | POA: Insufficient documentation

## 2012-07-10 DIAGNOSIS — N949 Unspecified condition associated with female genital organs and menstrual cycle: Secondary | ICD-10-CM | POA: Insufficient documentation

## 2012-07-10 DIAGNOSIS — Z3202 Encounter for pregnancy test, result negative: Secondary | ICD-10-CM | POA: Insufficient documentation

## 2012-07-10 DIAGNOSIS — Z8742 Personal history of other diseases of the female genital tract: Secondary | ICD-10-CM | POA: Insufficient documentation

## 2012-07-10 DIAGNOSIS — Z87442 Personal history of urinary calculi: Secondary | ICD-10-CM | POA: Insufficient documentation

## 2012-07-10 DIAGNOSIS — Z79899 Other long term (current) drug therapy: Secondary | ICD-10-CM | POA: Insufficient documentation

## 2012-07-10 DIAGNOSIS — K219 Gastro-esophageal reflux disease without esophagitis: Secondary | ICD-10-CM | POA: Insufficient documentation

## 2012-07-10 DIAGNOSIS — Z8619 Personal history of other infectious and parasitic diseases: Secondary | ICD-10-CM | POA: Insufficient documentation

## 2012-07-10 DIAGNOSIS — F411 Generalized anxiety disorder: Secondary | ICD-10-CM | POA: Insufficient documentation

## 2012-07-10 DIAGNOSIS — R102 Pelvic and perineal pain: Secondary | ICD-10-CM

## 2012-07-10 LAB — URINALYSIS, ROUTINE W REFLEX MICROSCOPIC
Bilirubin Urine: NEGATIVE
Glucose, UA: NEGATIVE mg/dL
Hgb urine dipstick: NEGATIVE
Nitrite: NEGATIVE
Specific Gravity, Urine: 1.022 (ref 1.005–1.030)
pH: 5.5 (ref 5.0–8.0)

## 2012-07-10 LAB — URINE MICROSCOPIC-ADD ON

## 2012-07-10 NOTE — ED Notes (Signed)
Pt alert, arrives from home, c/o sharp pain in lower back radiating to lower abd, onset was several days ago, denies trauma or injury, states "a lil white discharge", resp even unlabored, skin pwd

## 2012-07-11 LAB — GC/CHLAMYDIA PROBE AMP: CT Probe RNA: NEGATIVE

## 2012-07-11 LAB — WET PREP, GENITAL

## 2012-07-11 MED ORDER — NAPROXEN 500 MG PO TABS
500.0000 mg | ORAL_TABLET | Freq: Once | ORAL | Status: AC
  Administered 2012-07-11: 500 mg via ORAL
  Filled 2012-07-11: qty 1

## 2012-07-11 MED ORDER — NAPROXEN 500 MG PO TABS: 500.0000 mg | ORAL_TABLET | Freq: Two times a day (BID) | ORAL | Status: DC

## 2012-07-11 NOTE — ED Provider Notes (Signed)
History     CSN: [id number]  Arrival date & time 07/10/12  2008   First MD Initiated Contact with Patient 07/10/12 2305      Chief Complaint  Patient presents with  . Flank Pain    (Consider location/radiation/quality/duration/timing/severity/associated sxs/prior treatment) Patient is a 32 y.o. female presenting with flank pain.  Flank Pain   32 year old female presents to the emergency department with complaint of low back pain radiating around to her lower abdomen bilaterally. Patient reports pain started this morning around 8 AM. She reports it happened soon after bending down the left upper 67-year-old. She is concerned that something is going on in her abdomen, 2 to crampy type sensation. Patient reports she has had similar symptoms in the past, reports about 6 episodes. She reports she comes to the ER each time, and is told that she has different diagnoses. She has history of ovarian cyst, bacterial vaginosis. She does report she is having some white discharge. She denies any new sexual partners. She is taken ibuprofen without improvement in symptoms. No fevers no chills. No bowel or bladder dysfunction  Past Medical History  Diagnosis Date  . Headache     sinus  . GERD (gastroesophageal reflux disease)   . Kidney stones     no current problem  . Anxiety   . Deviated septum 09/2011  . 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  . Complication of anesthesia     states small mouth    Past Surgical History  Procedure Date  . Polyps removed     colon  . Wisdom tooth extraction   . Nasal septoplasty w/ turbinoplasty 10/04/2011    Procedure: NASAL SEPTOPLASTY WITH TURBINATE REDUCTION;  Surgeon: Darletta Moll, MD;  Location: [facility];  Service: ENT;  Laterality: Bilateral;    Family History  Problem Relation Age of Onset  . Cancer Mother   . Hypertension Mother   . Hypertension Father   . Cancer Brother   . Hypertension Other     History  Substance Use  Topics  . Smoking status: Never Smoker   . Smokeless tobacco: Never Used  . Alcohol Use: No    OB History    Grav Para Term Preterm Abortions TAB SAB Ect Mult Living                  Review of Systems  Genitourinary: Positive for flank pain.    See History of Present Illness; otherwise all other systems are reviewed and negative Allergies  Sulfa antibiotics  Home Medications   Current Outpatient Rx  Name  Route  Sig  Dispense  Refill  . CLONAZEPAM 1 MG PO TABS   Oral   Take 1 mg by mouth daily. AM         . FAMOTIDINE 20 MG PO TABS   Oral   Take 1 tablet (20 mg total) by mouth 2 (two) times daily.   60 tablet   0     Take 1 tablet twice a day for the first 7 days and ...   . IBUPROFEN 200 MG PO TABS   Oral   Take 400 mg by mouth every 8 (eight) hours as needed. For pain.         Marland Kitchen ETONOGESTREL 68 MG [place] IMPL   Subcutaneous   Inject into the skin once.             BP 137/89  Pulse 79  Temp  98 F (36.7 C)  Resp 16  SpO2 99%  Physical Exam  Nursing note and vitals reviewed. Constitutional: She is oriented to person, place, and time. She appears well-developed and well-nourished.  HENT:  Head: Normocephalic and atraumatic.  Nose: Nose normal.  Mouth/Throat: Oropharynx is clear and moist.  Eyes: Conjunctivae normal and EOM are normal. Pupils are equal, round, and reactive to light.  Neck: Normal range of motion. Neck supple. No JVD present. No tracheal deviation present. No thyromegaly present.  Cardiovascular: Normal rate, regular rhythm, normal heart sounds and intact distal pulses.  Exam reveals no gallop and no friction rub.   No murmur heard. Pulmonary/Chest: Effort normal and breath sounds normal. No stridor. No respiratory distress. She has no wheezes. She has no rales. She exhibits no tenderness.  Abdominal: Soft. Bowel sounds are normal. She exhibits no distension and no mass. There is tenderness (mild tenderness diffusely across lower  abdomen). There is no rebound and no guarding.  Musculoskeletal: Normal range of motion. She exhibits tenderness (patient with point tenderness to lumbarspine, bilateral musculoskeletal tenderness, pain over her SI joints bilaterally no step-off no crepitus no overlying skin changes). She exhibits no edema.  Lymphadenopathy:    She has no cervical adenopathy.  Neurological: She is alert and oriented to person, place, and time. She has normal reflexes. She exhibits normal muscle tone. Coordination normal.  Skin: Skin is warm and dry. No rash noted. No erythema. No pallor.  Psychiatric: She has a normal mood and affect. Her behavior is normal. Judgment and thought content normal.    ED Course  Procedures (including critical care time)  Labs Reviewed  URINALYSIS, ROUTINE W REFLEX MICROSCOPIC - Abnormal; Notable for the following:    APPearance CLOUDY (*)     Leukocytes, UA SMALL (*)     All other components within normal limits  URINE MICROSCOPIC-ADD ON - Abnormal; Notable for the following:    Squamous Epithelial / LPF FEW (*)     Bacteria, UA MANY (*)     All other components within normal limits  WET PREP, GENITAL - Abnormal; Notable for the following:    WBC, Wet Prep HPF POC FEW (*)     All other components within normal limits  PREGNANCY, URINE  GC/CHLAMYDIA PROBE AMP   No results found.   1. Low back pain   2. Pelvic pain       MDM  32 year old female with what appears to be musculoskeletal low back pain. Will check pelvic as she is having some discharge but do not feel this is cause for pain        Olivia Mackie, MD 07/11/12 [phone]

## 2012-10-02 ENCOUNTER — Encounter (HOSPITAL_COMMUNITY): Payer: Self-pay | Admitting: *Deleted

## 2012-10-02 ENCOUNTER — Inpatient Hospital Stay (HOSPITAL_COMMUNITY)
Admission: AD | Admit: 2012-10-02 | Discharge: 2012-10-02 | Disposition: A | Discharge status: Home / Self Care | Payer: Medicaid Other | Source: Ambulatory Visit | Attending: Obstetrics | Admitting: Obstetrics

## 2012-10-02 DIAGNOSIS — N949 Unspecified condition associated with female genital organs and menstrual cycle: Secondary | ICD-10-CM | POA: Insufficient documentation

## 2012-10-02 DIAGNOSIS — R109 Unspecified abdominal pain: Secondary | ICD-10-CM | POA: Insufficient documentation

## 2012-10-02 DIAGNOSIS — N898 Other specified noninflammatory disorders of vagina: Secondary | ICD-10-CM

## 2012-10-02 DIAGNOSIS — N939 Abnormal uterine and vaginal bleeding, unspecified: Secondary | ICD-10-CM

## 2012-10-02 DIAGNOSIS — N938 Other specified abnormal uterine and vaginal bleeding: Secondary | ICD-10-CM | POA: Insufficient documentation

## 2012-10-02 LAB — URINE MICROSCOPIC-ADD ON

## 2012-10-02 LAB — URINALYSIS, ROUTINE W REFLEX MICROSCOPIC
Ketones, ur: NEGATIVE mg/dL
Leukocytes, UA: NEGATIVE
Protein, ur: NEGATIVE mg/dL
Urobilinogen, UA: 0.2 mg/dL (ref 0.0–1.0)

## 2012-10-02 LAB — POCT PREGNANCY, URINE: Preg Test, Ur: NEGATIVE

## 2012-10-02 MED ORDER — KETOROLAC TROMETHAMINE 60 MG/2ML IM SOLN
60.0000 mg | Freq: Once | INTRAMUSCULAR | Status: AC
  Administered 2012-10-02: 60 mg via INTRAMUSCULAR
  Filled 2012-10-02: qty 2

## 2012-10-02 NOTE — MAU Note (Signed)
Pt states implanon placed 2 years ago, never has had cycle before, now bleeding and seeing strings of clots come out with voiding. Cramping in lower abdomen. Pain and bleeding both began yesterday.

## 2012-10-02 NOTE — MAU Provider Note (Signed)
History     CSN: [id number]  Arrival date and time: 10/02/12 1653   None     Chief Complaint  Patient presents with  . Abdominal Pain  . Vaginal Bleeding   HPI  Pt states implanon placed 2 years ago, never has had cycle before, now bleeding and seeing strings of clots come out with voiding. Cramping in lower abdomen. Pain and bleeding both began yesterday. Reports noticing blood when urinating, small clots.  Not requiring pad.  No UTI symptoms.     Past Medical History  Diagnosis Date  . Headache     sinus  . GERD (gastroesophageal reflux disease)   . Kidney stones     no current problem  . Anxiety   . Deviated septum 09/2011  . 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  . Complication of anesthesia     states small mouth    Past Surgical History  Procedure Laterality Date  . Polyps removed      colon  . Wisdom tooth extraction    . Nasal septoplasty w/ turbinoplasty  10/04/2011    Procedure: NASAL SEPTOPLASTY WITH TURBINATE REDUCTION;  Surgeon: Darletta Moll, MD;  Location: [facility];  Service: ENT;  Laterality: Bilateral;    Family History  Problem Relation Age of Onset  . Cancer Mother   . Hypertension Mother   . Hypertension Father   . Cancer Brother   . Hypertension Other     History  Substance Use Topics  . Smoking status: Former Smoker -- 0.25 packs/day    Types: Cigarettes  . Smokeless tobacco: Former Neurosurgeon    Quit date: 10/02/2008  . Alcohol Use: No    Allergies:  Allergies  Allergen Reactions  . Sulfa Antibiotics Rash    Prescriptions prior to admission  Medication Sig Dispense Refill  . clonazePAM (KLONOPIN) 1 MG tablet Take 1 mg by mouth daily. AM      . Etonogestrel (IMPLANON) 68 MG IMPL Inject into the skin once.        Marland Kitchen ibuprofen (ADVIL,MOTRIN) 200 MG tablet Take 400 mg by mouth every 8 (eight) hours as needed. For pain.      . naproxen (NAPROSYN) 500 MG tablet Take 1 tablet (500 mg total) by mouth 2 (two) times daily  with a meal.  30 tablet  0    Review of Systems  Gastrointestinal: Positive for abdominal pain (cramping).  Genitourinary:       Vaginal bleeding  All other systems reviewed and are negative.   Physical Exam   Blood pressure 128/73, pulse 47, temperature 97.1 F (36.2 C), temperature source Oral, resp. rate 16, height 5\' 3"  (1.6 m), weight 79.436 kg (175 lb 2 oz).  Physical Exam  Constitutional: She is oriented to person, place, and time. She appears well-developed and well-nourished. No distress.  HENT:  Head: Normocephalic.  Neck: Normal range of motion. Neck supple.  Cardiovascular: Normal rate, regular rhythm and normal heart sounds.   Respiratory: Effort normal and breath sounds normal.  GI: Soft. There is tenderness (mild; lower pelvis).  Genitourinary: There is bleeding (negative clots, scant) around the vagina.  Musculoskeletal: Normal range of motion. She exhibits no edema.  implanon palpated in place  Neurological: She is alert and oriented to person, place, and time. She has normal reflexes.  Skin: Skin is warm and dry.    MAU Course  Procedures Results for orders placed during the hospital encounter of 10/02/12 (from the past  24 hour(s))  URINALYSIS, ROUTINE W REFLEX MICROSCOPIC     Status: Abnormal   Collection Time    10/02/12  6:15 PM      Result Value Range   Color, Urine YELLOW  YELLOW   APPearance CLEAR  CLEAR   Specific Gravity, Urine >1.030 (*) 1.005 - 1.030   pH 6.0  5.0 - 8.0   Glucose, UA NEGATIVE  NEGATIVE mg/dL   Hgb urine dipstick LARGE (*) NEGATIVE   Bilirubin Urine NEGATIVE  NEGATIVE   Ketones, ur NEGATIVE  NEGATIVE mg/dL   Protein, ur NEGATIVE  NEGATIVE mg/dL   Urobilinogen, UA 0.2  0.0 - 1.0 mg/dL   Nitrite NEGATIVE  NEGATIVE   Leukocytes, UA NEGATIVE  NEGATIVE  URINE MICROSCOPIC-ADD ON     Status: None   Collection Time    10/02/12  6:15 PM      Result Value Range   Squamous Epithelial / LPF RARE  RARE   WBC, UA 0-2  <3 WBC/hpf    RBC / HPF 0-2  <3 RBC/hpf   Bacteria, UA RARE  RARE   Urine-Other MUCOUS PRESENT    POCT PREGNANCY, URINE     Status: None   Collection Time    10/02/12  6:33 PM      Result Value Range   Preg Test, Ur NEGATIVE  NEGATIVE     Assessment and Plan  Vaginal Bleeding - Normal  Plan: Provided reassurance Outpatient ultrasound due to pt history of cysts.  [facility] 10/02/2012, 7:31 PM

## 2012-10-05 ENCOUNTER — Ambulatory Visit (HOSPITAL_COMMUNITY)
Admission: RE | Admit: 2012-10-05 | Discharge: 2012-10-05 | Disposition: A | Discharge status: Home / Self Care | Payer: Medicaid Other | Source: Ambulatory Visit | Attending: Family | Admitting: Family

## 2012-10-05 DIAGNOSIS — N939 Abnormal uterine and vaginal bleeding, unspecified: Secondary | ICD-10-CM

## 2012-10-05 DIAGNOSIS — N83209 Unspecified ovarian cyst, unspecified side: Secondary | ICD-10-CM | POA: Insufficient documentation

## 2012-10-05 DIAGNOSIS — N949 Unspecified condition associated with female genital organs and menstrual cycle: Secondary | ICD-10-CM | POA: Insufficient documentation

## 2012-10-09 ENCOUNTER — Telehealth: Payer: Self-pay | Admitting: *Deleted

## 2012-10-09 MED ORDER — IBUPROFEN 800 MG PO TABS: 800.0000 mg | ORAL_TABLET | Freq: Three times a day (TID) | ORAL | Status: DC | PRN

## 2012-10-09 NOTE — Telephone Encounter (Signed)
Pt had an ultrasound at MAU on 10-02-12 and is calling for the results.

## 2012-10-09 NOTE — Telephone Encounter (Signed)
Patient called regarding pain medication.  Per Dr. Clearance Coots - Ibuprofen 800mg  PO Q 8 hrs #30 with 5 refills sent to her pharmacy.

## 2012-12-03 ENCOUNTER — Encounter (HOSPITAL_COMMUNITY): Payer: Self-pay | Admitting: Emergency Medicine

## 2012-12-03 ENCOUNTER — Emergency Department (HOSPITAL_COMMUNITY)
Admission: EM | Admit: 2012-12-03 | Discharge: 2012-12-03 | Disposition: A | Discharge status: Home / Self Care | Payer: Medicaid Other | Attending: Emergency Medicine | Admitting: Emergency Medicine

## 2012-12-03 DIAGNOSIS — J343 Hypertrophy of nasal turbinates: Secondary | ICD-10-CM | POA: Insufficient documentation

## 2012-12-03 DIAGNOSIS — Z87442 Personal history of urinary calculi: Secondary | ICD-10-CM | POA: Insufficient documentation

## 2012-12-03 DIAGNOSIS — M545 Low back pain, unspecified: Secondary | ICD-10-CM | POA: Insufficient documentation

## 2012-12-03 DIAGNOSIS — R059 Cough, unspecified: Secondary | ICD-10-CM | POA: Insufficient documentation

## 2012-12-03 DIAGNOSIS — J069 Acute upper respiratory infection, unspecified: Secondary | ICD-10-CM | POA: Insufficient documentation

## 2012-12-03 DIAGNOSIS — F411 Generalized anxiety disorder: Secondary | ICD-10-CM | POA: Insufficient documentation

## 2012-12-03 DIAGNOSIS — Z3202 Encounter for pregnancy test, result negative: Secondary | ICD-10-CM | POA: Insufficient documentation

## 2012-12-03 DIAGNOSIS — M533 Sacrococcygeal disorders, not elsewhere classified: Secondary | ICD-10-CM

## 2012-12-03 DIAGNOSIS — Z87891 Personal history of nicotine dependence: Secondary | ICD-10-CM | POA: Insufficient documentation

## 2012-12-03 DIAGNOSIS — Z8719 Personal history of other diseases of the digestive system: Secondary | ICD-10-CM | POA: Insufficient documentation

## 2012-12-03 DIAGNOSIS — Z79899 Other long term (current) drug therapy: Secondary | ICD-10-CM | POA: Insufficient documentation

## 2012-12-03 DIAGNOSIS — R05 Cough: Secondary | ICD-10-CM | POA: Insufficient documentation

## 2012-12-03 LAB — URINALYSIS, ROUTINE W REFLEX MICROSCOPIC
Glucose, UA: NEGATIVE mg/dL
Nitrite: NEGATIVE
Protein, ur: NEGATIVE mg/dL

## 2012-12-03 LAB — URINE MICROSCOPIC-ADD ON

## 2012-12-03 LAB — RAPID STREP SCREEN (MED CTR MEBANE ONLY): Streptococcus, Group A Screen (Direct): NEGATIVE

## 2012-12-03 LAB — POCT PREGNANCY, URINE: Preg Test, Ur: NEGATIVE

## 2012-12-03 MED ORDER — MUCINEX DM 30-600 MG PO TB12: 1.0000 | ORAL_TABLET | Freq: Two times a day (BID) | ORAL | Status: DC | PRN

## 2012-12-03 MED ORDER — CYCLOBENZAPRINE HCL 5 MG PO TABS: 5.0000 mg | ORAL_TABLET | Freq: Three times a day (TID) | ORAL | Status: DC | PRN

## 2012-12-03 NOTE — ED Provider Notes (Signed)
History     CSN: [id number]  Arrival date & time 12/03/12  8295   First MD Initiated Contact with Patient 12/03/12 [phone]      Chief Complaint  Patient presents with  . Sore Throat  . Back Pain    (Consider location/radiation/quality/duration/timing/severity/associated sxs/prior treatment) HPI Patient reports she's had some low back pain and indicates her area in the right SI joint for the past week. She has no known injury. She states she has a history of a "pinched sciatic nerve", ovarian cyst and UTIs and this is where she commonly gets pain with either of those problems. She states 2 days ago she started having a cough and has some yellow-green sputum. She states she has a sore throat and is hard to swallow. She states she feels weak and has no energy. She relates she has chronic stuffy nose however when she blows her nose now there is some blood in it. She reports a sinus surgery and septoplasty about a year ago without improvement in her chronic stuffy nose. She denies fever but has had some chills. She feels some mild shortness of breath but denies chest pain. She denies dysuria but does have frequency. She denies pain in her flank area.  PCP Dr Ralene Ok GYN Dr Clearance Coots  Past Medical History  Diagnosis Date  . Headache(784.0)     sinus  . GERD (gastroesophageal reflux disease)   . Kidney stones     no current problem  . Anxiety   . Deviated septum 09/2011  . 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  . Complication of anesthesia     states small mouth    Past Surgical History  Procedure Laterality Date  . Polyps removed      colon  . Wisdom tooth extraction    . Nasal septoplasty w/ turbinoplasty  10/04/2011    Procedure: NASAL SEPTOPLASTY WITH TURBINATE REDUCTION;  Surgeon: Darletta Moll, MD;  Location: [facility];  Service: ENT;  Laterality: Bilateral;    Family History  Problem Relation Age of Onset  . Cancer Mother   . Hypertension Mother   .  Hypertension Father   . Cancer Brother   . Hypertension Other     History  Substance Use Topics  . Smoking status: Former Smoker -- 0.25 packs/day    Types: Cigarettes  . Smokeless tobacco: Former Neurosurgeon    Quit date: 10/02/2008  . Alcohol Use: No  employed  OB History   Grav Para Term Preterm Abortions TAB SAB Ect Mult Living   4 4 4       4       Review of Systems  All other systems reviewed and are negative.    Allergies  Sulfa antibiotics  Home Medications   Current Outpatient Rx  Name  Route  Sig  Dispense  Refill  . clonazePAM (KLONOPIN) 1 MG tablet   Oral   Take 1 mg by mouth daily. AM         . Etonogestrel (IMPLANON) 68 MG IMPL   Subcutaneous   Inject into the skin once.          . famotidine (PEPCID) 10 MG tablet   Oral   Take 10 mg by mouth 2 (two) times daily. acid reflux         . ibuprofen (ADVIL,MOTRIN) 800 MG tablet   Oral   Take 800 mg by mouth every 8 (eight) hours as needed for pain.  BP 160/69  Pulse 78  Temp(Src) 98.4 F (36.9 C) (Oral)  Resp 18  SpO2 98%  Vital signs normal    Physical Exam  Nursing note and vitals reviewed. Constitutional: She is oriented to person, place, and time. She appears well-developed and well-nourished.  Non-toxic appearance. She does not appear ill. No distress.  HENT:  Head: Normocephalic and atraumatic.  Right Ear: External ear normal.  Left Ear: External ear normal.  Nose: Nose normal. No mucosal edema or rhinorrhea.  Mouth/Throat: Oropharynx is clear and moist and mucous membranes are normal. No dental abscesses or edematous.  Eyes: Conjunctivae and EOM are normal. Pupils are equal, round, and reactive to light.  Neck: Normal range of motion and full passive range of motion without pain. Neck supple.  Cardiovascular: Normal rate, regular rhythm and normal heart sounds.  Exam reveals no gallop and no friction rub.   No murmur heard. Pulmonary/Chest: Effort normal and breath  sounds normal. No respiratory distress. She has no wheezes. She has no rhonchi. She has no rales. She exhibits no tenderness and no crepitus.  Abdominal: Soft. Normal appearance and bowel sounds are normal. She exhibits no distension. There is no tenderness. There is no rebound and no guarding.  Musculoskeletal: Normal range of motion. She exhibits no edema and no tenderness.       Back:  Moves all extremities well.   Neurological: She is alert and oriented to person, place, and time. She has normal strength. No cranial nerve deficit.  Skin: Skin is warm, dry and intact. No rash noted. No erythema. No pallor.  Psychiatric: She has a normal mood and affect. Her speech is normal and behavior is normal. Her mood appears not anxious.    ED Course  Procedures (including critical care time)  Results for orders placed during the hospital encounter of 12/03/12  RAPID STREP SCREEN      Result Value Range   Streptococcus, Group A Screen (Direct) NEGATIVE  NEGATIVE  URINALYSIS, ROUTINE W REFLEX MICROSCOPIC      Result Value Range   Color, Urine YELLOW  YELLOW   APPearance CLOUDY (*) CLEAR   Specific Gravity, Urine 1.019  1.005 - 1.030   pH 7.0  5.0 - 8.0   Glucose, UA NEGATIVE  NEGATIVE mg/dL   Hgb urine dipstick NEGATIVE  NEGATIVE   Bilirubin Urine NEGATIVE  NEGATIVE   Ketones, ur NEGATIVE  NEGATIVE mg/dL   Protein, ur NEGATIVE  NEGATIVE mg/dL   Urobilinogen, UA 0.2  0.0 - 1.0 mg/dL   Nitrite NEGATIVE  NEGATIVE   Leukocytes, UA SMALL (*) NEGATIVE  URINE MICROSCOPIC-ADD ON      Result Value Range   Squamous Epithelial / LPF MANY (*) RARE   WBC, UA 3-6  <3 WBC/hpf   Bacteria, UA MANY (*) RARE  POCT PREGNANCY, URINE      Result Value Range   Preg Test, Ur NEGATIVE  NEGATIVE   Laboratory interpretation all normal except contaminated urine    1. URI, acute   2. Sacral back pain    New Prescriptions   CYCLOBENZAPRINE (FLEXERIL) 5 MG TABLET    Take 1 tablet (5 mg total) by mouth 3  (three) times daily as needed for muscle spasms.   DEXTROMETHORPHAN-GUAIFENESIN (MUCINEX DM) 30-600 MG TB12    Take 1 tablet by mouth 2 (two) times daily as needed (cough).    Plan discharge  Devoria Albe, MD, Armando Gang    MDM  Ward Givens, MD 12/03/12 [phone]

## 2012-12-03 NOTE — ED Notes (Signed)
Pt presenting to ed with c/o sore throat x 2 days. Pt also with c/o low back pain x 1 week

## 2012-12-04 LAB — URINE CULTURE: Colony Count: 30000

## 2012-12-05 LAB — CULTURE, GROUP A STREP

## 2012-12-24 ENCOUNTER — Inpatient Hospital Stay (HOSPITAL_COMMUNITY)
Admission: AD | Admit: 2012-12-24 | Discharge: 2012-12-24 | Disposition: A | Discharge status: Home / Self Care | Payer: Medicaid Other | Source: Ambulatory Visit | Attending: Obstetrics | Admitting: Obstetrics

## 2012-12-24 ENCOUNTER — Inpatient Hospital Stay (HOSPITAL_COMMUNITY): Payer: Medicaid Other

## 2012-12-24 ENCOUNTER — Encounter (HOSPITAL_COMMUNITY): Payer: Self-pay | Admitting: *Deleted

## 2012-12-24 DIAGNOSIS — R350 Frequency of micturition: Secondary | ICD-10-CM | POA: Insufficient documentation

## 2012-12-24 DIAGNOSIS — R109 Unspecified abdominal pain: Secondary | ICD-10-CM | POA: Insufficient documentation

## 2012-12-24 DIAGNOSIS — N760 Acute vaginitis: Secondary | ICD-10-CM | POA: Insufficient documentation

## 2012-12-24 DIAGNOSIS — A499 Bacterial infection, unspecified: Secondary | ICD-10-CM

## 2012-12-24 DIAGNOSIS — B9689 Other specified bacterial agents as the cause of diseases classified elsewhere: Secondary | ICD-10-CM | POA: Insufficient documentation

## 2012-12-24 HISTORY — DX: Female pelvic inflammatory disease, unspecified: N73.9

## 2012-12-24 LAB — URINALYSIS, ROUTINE W REFLEX MICROSCOPIC
Glucose, UA: NEGATIVE mg/dL
Specific Gravity, Urine: 1.025 (ref 1.005–1.030)

## 2012-12-24 LAB — URINE MICROSCOPIC-ADD ON

## 2012-12-24 LAB — WET PREP, GENITAL

## 2012-12-24 MED ORDER — KETOROLAC TROMETHAMINE 60 MG/2ML IM SOLN
60.0000 mg | Freq: Once | INTRAMUSCULAR | Status: AC
  Administered 2012-12-24: 60 mg via INTRAMUSCULAR
  Filled 2012-12-24: qty 2

## 2012-12-24 MED ORDER — METRONIDAZOLE 500 MG PO TABS: 500.0000 mg | ORAL_TABLET | Freq: Two times a day (BID) | ORAL | Status: DC

## 2012-12-24 NOTE — MAU Provider Note (Signed)
History     CSN: [id number]  Arrival date and time: 12/24/12 2130   First Provider Initiated Contact with Patient 12/24/12 [phone]      Chief Complaint  Patient presents with  . Urinary Frequency  . Back Pain   HPI 32 y.o.  [id number] with low back pain x 6 days, low abd pressure and urinary frequency x 2 days, feeling fatigued. Some spotting with urination. + nausea and chills, no vomiting or fever.   Past Medical History  Diagnosis Date  . Headache(784.0)     sinus  . GERD (gastroesophageal reflux disease)   . Kidney stones     no current problem  . Anxiety   . Deviated septum 09/2011  . 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  . Complication of anesthesia     states small mouth  . Pelvic inflammatory disease     Past Surgical History  Procedure Laterality Date  . Polyps removed      colon  . Wisdom tooth extraction    . Nasal septoplasty w/ turbinoplasty  10/04/2011    Procedure: NASAL SEPTOPLASTY WITH TURBINATE REDUCTION;  Surgeon: Darletta Moll, MD;  Location: [facility];  Service: ENT;  Laterality: Bilateral;    Family History  Problem Relation Age of Onset  . Cancer Mother   . Hypertension Mother   . Hypertension Father   . Cancer Brother   . Hypertension Other     History  Substance Use Topics  . Smoking status: Former Smoker -- 0.25 packs/day    Types: Cigarettes  . Smokeless tobacco: Former Neurosurgeon    Quit date: 10/02/2008  . Alcohol Use: Yes     Comment: occasional    Allergies:  Allergies  Allergen Reactions  . Sulfa Antibiotics Hives and Rash    Prescriptions prior to admission  Medication Sig Dispense Refill  . clonazePAM (KLONOPIN) 1 MG tablet Take 1 mg by mouth daily. AM      . cyclobenzaprine (FLEXERIL) 5 MG tablet Take 1 tablet (5 mg total) by mouth 3 (three) times daily as needed for muscle spasms.  30 tablet  0  . Dextromethorphan-Guaifenesin (MUCINEX DM) 30-600 MG TB12 Take 1 tablet by mouth 2 (two) times daily as  needed (cough).  20 each  0  . Etonogestrel (IMPLANON) 68 MG IMPL Inject into the skin once.       . famotidine (PEPCID) 10 MG tablet Take 10 mg by mouth 2 (two) times daily. acid reflux      . ibuprofen (ADVIL,MOTRIN) 800 MG tablet Take 800 mg by mouth every 8 (eight) hours as needed for pain.        Review of Systems  Constitutional: Positive for chills.  Respiratory: Negative.   Cardiovascular: Negative.   Gastrointestinal: Positive for nausea and abdominal pain. Negative for vomiting, diarrhea and constipation.  Genitourinary: Positive for dysuria, urgency and frequency. Negative for hematuria and flank pain.       + vaginal bleeding   Musculoskeletal: Negative.   Neurological: Negative.   Psychiatric/Behavioral: Negative.    Physical Exam   Blood pressure 136/57, pulse 67, temperature 97.9 F (36.6 C), temperature source Oral, resp. rate 16, height 5\' 3"  (1.6 m), weight 177 lb 3.2 oz (80.377 kg).  Physical Exam  Nursing note and vitals reviewed. Constitutional: She is oriented to person, place, and time. She appears well-developed and well-nourished. No distress.  HENT:  Head: Normocephalic and atraumatic.  Cardiovascular: Normal rate and regular rhythm.  Respiratory: Effort normal. No respiratory distress.  GI: Soft. Bowel sounds are normal. She exhibits no distension and no mass. There is no tenderness. There is CVA tenderness. There is no rebound and no guarding.  Genitourinary: There is no rash or lesion on the right labia. There is no rash or lesion on the left labia. Uterus is not deviated, not enlarged, not fixed and not tender. Cervix exhibits no motion tenderness, no discharge and no friability. Right adnexum displays tenderness and fullness. Right adnexum displays no mass. Left adnexum displays no mass, no tenderness and no fullness. There is bleeding (scant) around the vagina. No erythema or tenderness around the vagina. No vaginal discharge found.  Neurological: She  is alert and oriented to person, place, and time.  Skin: Skin is warm and dry.  Psychiatric: She has a normal mood and affect.    MAU Course  Procedures  Results for orders placed during the hospital encounter of 12/24/12 (from the past 24 hour(s))  URINALYSIS, ROUTINE W REFLEX MICROSCOPIC     Status: Abnormal   Collection Time    12/24/12  8:10 AM      Result Value Range   Color, Urine YELLOW  YELLOW   APPearance CLEAR  CLEAR   Specific Gravity, Urine 1.025  1.005 - 1.030   pH 6.0  5.0 - 8.0   Glucose, UA NEGATIVE  NEGATIVE mg/dL   Hgb urine dipstick LARGE (*) NEGATIVE   Bilirubin Urine NEGATIVE  NEGATIVE   Ketones, ur NEGATIVE  NEGATIVE mg/dL   Protein, ur NEGATIVE  NEGATIVE mg/dL   Urobilinogen, UA 0.2  0.0 - 1.0 mg/dL   Nitrite NEGATIVE  NEGATIVE   Leukocytes, UA SMALL (*) NEGATIVE  URINE MICROSCOPIC-ADD ON     Status: Abnormal   Collection Time    12/24/12  8:10 AM      Result Value Range   Squamous Epithelial / LPF RARE  RARE   WBC, UA 0-2  <3 WBC/hpf   Bacteria, UA FEW (*) RARE  POCT PREGNANCY, URINE     Status: None   Collection Time    12/24/12  8:14 AM      Result Value Range   Preg Test, Ur NEGATIVE  NEGATIVE  WET PREP, GENITAL     Status: Abnormal   Collection Time    12/24/12  8:59 AM      Result Value Range   Yeast Wet Prep HPF POC NONE SEEN  NONE SEEN   Trich, Wet Prep NONE SEEN  NONE SEEN   Clue Cells Wet Prep HPF POC MODERATE (*) NONE SEEN   WBC, Wet Prep HPF POC FEW (*) NONE SEEN     Assessment and Plan   1. BV (bacterial vaginosis)       Medication List         clonazePAM 1 MG tablet  Commonly known as:  KLONOPIN  Take 1 mg by mouth daily. AM     cyclobenzaprine 5 MG tablet  Commonly known as:  FLEXERIL  Take 1 tablet (5 mg total) by mouth 3 (three) times daily as needed for muscle spasms.     famotidine 10 MG tablet  Commonly known as:  PEPCID  Take 10 mg by mouth 2 (two) times daily. acid reflux     ibuprofen 800 MG tablet   Commonly known as:  ADVIL,MOTRIN  Take 800 mg by mouth every 8 (eight) hours as needed for pain.     IMPLANON 68 MG Impl implant  Generic drug:  etonogestrel  Inject into the skin once.     metroNIDAZOLE 500 MG tablet  Commonly known as:  FLAGYL  Take 1 tablet (500 mg total) by mouth 2 (two) times daily.            Follow-up Information   Follow up with HARPER,CHARLES A, MD. (As needed)    Contact information:   [address] [place] [address] [phone]         Georges Mouse 12/24/2012, 8:25 AM

## 2012-12-24 NOTE — MAU Note (Signed)
Patient states she has been having back pain since last Wednesday but started having lower abdominal pressure and urinary frequency yesterday. Patient states she has an Implanon and doesn't have periods but has been passing some blood clots.

## 2012-12-24 NOTE — MAU Note (Signed)
Spoke with Jorene Minors RN with Infection Prevention, pt had neg PCR in 2011, MRSA flag to be removed from pt's chart.

## 2012-12-25 LAB — GC/CHLAMYDIA PROBE AMP
CT Probe RNA: NEGATIVE
GC Probe RNA: NEGATIVE

## 2013-01-20 ENCOUNTER — Emergency Department (HOSPITAL_BASED_OUTPATIENT_CLINIC_OR_DEPARTMENT_OTHER): Payer: Medicaid Other

## 2013-01-20 ENCOUNTER — Encounter (HOSPITAL_BASED_OUTPATIENT_CLINIC_OR_DEPARTMENT_OTHER): Payer: Self-pay

## 2013-01-20 ENCOUNTER — Emergency Department (HOSPITAL_BASED_OUTPATIENT_CLINIC_OR_DEPARTMENT_OTHER)
Admission: EM | Admit: 2013-01-20 | Discharge: 2013-01-20 | Disposition: A | Discharge status: Home / Self Care | Payer: Medicaid Other | Attending: Emergency Medicine | Admitting: Emergency Medicine

## 2013-01-20 DIAGNOSIS — Z79899 Other long term (current) drug therapy: Secondary | ICD-10-CM | POA: Insufficient documentation

## 2013-01-20 DIAGNOSIS — Z8709 Personal history of other diseases of the respiratory system: Secondary | ICD-10-CM | POA: Insufficient documentation

## 2013-01-20 DIAGNOSIS — F411 Generalized anxiety disorder: Secondary | ICD-10-CM | POA: Insufficient documentation

## 2013-01-20 DIAGNOSIS — K219 Gastro-esophageal reflux disease without esophagitis: Secondary | ICD-10-CM | POA: Insufficient documentation

## 2013-01-20 DIAGNOSIS — Z87891 Personal history of nicotine dependence: Secondary | ICD-10-CM | POA: Insufficient documentation

## 2013-01-20 DIAGNOSIS — R51 Headache: Secondary | ICD-10-CM | POA: Insufficient documentation

## 2013-01-20 DIAGNOSIS — R110 Nausea: Secondary | ICD-10-CM | POA: Insufficient documentation

## 2013-01-20 DIAGNOSIS — Z87442 Personal history of urinary calculi: Secondary | ICD-10-CM | POA: Insufficient documentation

## 2013-01-20 DIAGNOSIS — Z8742 Personal history of other diseases of the female genital tract: Secondary | ICD-10-CM | POA: Insufficient documentation

## 2013-01-20 MED ORDER — DIPHENHYDRAMINE HCL 50 MG/ML IJ SOLN
25.0000 mg | Freq: Once | INTRAMUSCULAR | Status: AC
  Administered 2013-01-20: 25 mg via INTRAVENOUS
  Filled 2013-01-20: qty 1

## 2013-01-20 MED ORDER — METOCLOPRAMIDE HCL 5 MG/ML IJ SOLN
10.0000 mg | Freq: Once | INTRAMUSCULAR | Status: AC
  Administered 2013-01-20: 10 mg via INTRAVENOUS
  Filled 2013-01-20: qty 2

## 2013-01-20 MED ORDER — KETOROLAC TROMETHAMINE 30 MG/ML IJ SOLN
30.0000 mg | Freq: Once | INTRAMUSCULAR | Status: AC
  Administered 2013-01-20: 30 mg via INTRAVENOUS
  Filled 2013-01-20: qty 1

## 2013-01-20 MED ORDER — SODIUM CHLORIDE 0.9 % IV BOLUS (SEPSIS)
1000.0000 mL | Freq: Once | INTRAVENOUS | Status: AC
  Administered 2013-01-20: 1000 mL via INTRAVENOUS

## 2013-01-20 NOTE — ED Provider Notes (Signed)
Medical screening examination/treatment/procedure(s) were performed by non-physician practitioner and as supervising physician I was immediately available for consultation/collaboration.   Rolan Bucco, MD 01/20/13 [phone]

## 2013-01-20 NOTE — ED Notes (Signed)
D/c home with no new Rx. Pt cao x 4 and ambulatory with steady gait

## 2013-01-20 NOTE — ED Notes (Signed)
Pt reports intermittent sharp pains in head since this am.

## 2013-01-20 NOTE — ED Provider Notes (Signed)
CSN: [id number]     Arrival date & time 01/20/13  1659 History     First MD Initiated Contact with Patient 01/20/13 1715     Chief Complaint  Patient presents with  . Headache   (Consider location/radiation/quality/duration/timing/severity/associated sxs/prior Treatment) HPI  32 year old female with history of recurrent headaches presents complaining of headache.  Patient reports all day today she has had a sharp shooting pain to her forehead. Pain usually lasting for seconds, recurrent, nonradiating. Patient felt mildly nauseous without vomiting or diarrhea. Denies fever, neck stiffness, photophobia, or phonophobia. Denies any numbness, weakness, or rash. Patient took ibuprofen earlier today without any relief. She has had sinus headache in the past but this felt different. She does not recall her last menstrual period since she has been using IUD. She reports feeling "not myself" for the past 2 days but did not specify specific symptoms. She has been eating and drinking as usual. Denies any recent alcohol use or recreational drug use.  Past Medical History  Diagnosis Date  . Headache(784.0)     sinus  . GERD (gastroesophageal reflux disease)   . Kidney stones     no current problem  . Anxiety   . Deviated septum 09/2011  . 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  . Complication of anesthesia     states small mouth  . Pelvic inflammatory disease    Past Surgical History  Procedure Laterality Date  . Polyps removed      colon  . Wisdom tooth extraction    . Nasal septoplasty w/ turbinoplasty  10/04/2011    Procedure: NASAL SEPTOPLASTY WITH TURBINATE REDUCTION;  Surgeon: Darletta Moll, MD;  Location: [facility];  Service: ENT;  Laterality: Bilateral;   Family History  Problem Relation Age of Onset  . Cancer Mother   . Hypertension Mother   . Hypertension Father   . Cancer Brother   . Hypertension Other    History  Substance Use Topics  . Smoking status:  Former Smoker -- 0.25 packs/day    Types: Cigarettes  . Smokeless tobacco: Former Neurosurgeon    Quit date: 10/02/2008  . Alcohol Use: Yes     Comment: occasional   OB History   Grav Para Term Preterm Abortions TAB SAB Ect Mult Living   4 4 4       4      Review of Systems  Constitutional: Negative for fever.  HENT: Negative for neck stiffness.   Skin: Negative for rash.  Neurological: Positive for headaches. Negative for numbness.    Allergies  Sulfa antibiotics  Home Medications   Current Outpatient Rx  Name  Route  Sig  Dispense  Refill  . clonazePAM (KLONOPIN) 1 MG tablet   Oral   Take 1 mg by mouth daily. AM         . Etonogestrel (IMPLANON) 68 MG IMPL   Subcutaneous   Inject into the skin once.          . famotidine (PEPCID) 10 MG tablet   Oral   Take 10 mg by mouth 2 (two) times daily. acid reflux         . ibuprofen (ADVIL,MOTRIN) 800 MG tablet   Oral   Take 800 mg by mouth every 8 (eight) hours as needed for pain.          BP 142/83  Pulse 83  Temp(Src) 98.5 F (36.9 C) (Oral)  Resp 16  Ht 5\' 3"  (1.6  m)  Wt 174 lb (78.926 kg)  BMI 30.83 kg/m2  SpO2 100% Physical Exam  Nursing note and vitals reviewed. Constitutional: She appears well-developed and well-nourished. No distress.  Awake, alert, nontoxic appearance  HENT:  Head: Atraumatic.  Mild sinus tenderness on percussion  Eyes: Conjunctivae are normal. Right eye exhibits no discharge. Left eye exhibits no discharge.  Neck: Neck supple.  No nuchal rigidity  Cardiovascular: Normal rate and regular rhythm.   Pulmonary/Chest: Effort normal. No respiratory distress. She exhibits no tenderness.  Abdominal: Soft. There is no tenderness. There is no rebound.  Musculoskeletal: She exhibits no tenderness.  ROM appears intact, no obvious focal weakness  Neurological: She has normal strength. No cranial nerve deficit or sensory deficit. GCS eye subscore is 4. GCS verbal subscore is 5. GCS motor  subscore is 6.  Mental status and motor strength appears intact  Skin: No rash noted.  Psychiatric: She has a normal mood and affect.    ED Course   Procedures (including critical care time)  Headache without red flag; no fever, neck stiffness, neuro findings or new symptoms to suggest more serious etiology.  I don't think SAH, ICH, meningitis, encephalitis, mass at this time.  No recent trauma.  I don't feel imaging necessary at this time.  Plan to control symptoms.  6:57 PM Pt report she still have a headache after receiving migraine cocktail.  Pt request for head CT.  Will obtain head CT to r/o SAH, intracranial mass.  7:33 PM Head CT unremarkable. Patient felt reassured. I recommend taking over-the-counter pain medication for pain. Patient agrees to followup with her PCP for further care. Return precaution discussed.  Labs Reviewed - No data to display Ct Head Wo Contrast  01/20/2013   *RADIOLOGY REPORT*  Clinical Data: Headache.  CT HEAD WITHOUT CONTRAST  Technique:  Contiguous axial images were obtained from the base of the skull through the vertex without contrast.  Comparison: None.  Findings: The brain demonstrates no evidence of hemorrhage, infarction, edema, mass effect, extra-axial fluid collection, hydrocephalus or mass lesion.  The skull is unremarkable.  IMPRESSION: Normal head CT.   Original Report Authenticated By: Irish Lack, M.D.   1. Headache     MDM  BP 142/83  Pulse 83  Temp(Src) 98.5 F (36.9 C) (Oral)  Resp 16  Ht 5\' 3"  (1.6 m)  Wt 174 lb (78.926 kg)  BMI 30.83 kg/m2  SpO2 100%  I have reviewed nursing notes and vital signs. I personally reviewed the imaging tests through PACS system  I reviewed available ER/hospitalization records thought the EMR   Fayrene Helper, [place] 01/20/13 1935

## 2013-02-25 ENCOUNTER — Inpatient Hospital Stay (HOSPITAL_COMMUNITY)
Admission: AD | Admit: 2013-02-25 | Discharge: 2013-02-25 | Disposition: A | Discharge status: Home / Self Care | Payer: Medicaid Other | Source: Ambulatory Visit | Attending: Obstetrics & Gynecology | Admitting: Obstetrics & Gynecology

## 2013-02-25 ENCOUNTER — Encounter (HOSPITAL_COMMUNITY): Payer: Self-pay

## 2013-02-25 DIAGNOSIS — R05 Cough: Secondary | ICD-10-CM | POA: Insufficient documentation

## 2013-02-25 DIAGNOSIS — R059 Cough, unspecified: Secondary | ICD-10-CM | POA: Insufficient documentation

## 2013-02-25 DIAGNOSIS — R109 Unspecified abdominal pain: Secondary | ICD-10-CM | POA: Insufficient documentation

## 2013-02-25 DIAGNOSIS — J029 Acute pharyngitis, unspecified: Secondary | ICD-10-CM | POA: Insufficient documentation

## 2013-02-25 DIAGNOSIS — N949 Unspecified condition associated with female genital organs and menstrual cycle: Secondary | ICD-10-CM | POA: Insufficient documentation

## 2013-02-25 DIAGNOSIS — N390 Urinary tract infection, site not specified: Secondary | ICD-10-CM | POA: Insufficient documentation

## 2013-02-25 LAB — URINALYSIS, ROUTINE W REFLEX MICROSCOPIC
Glucose, UA: NEGATIVE mg/dL
Nitrite: NEGATIVE
Protein, ur: NEGATIVE mg/dL
pH: 6 (ref 5.0–8.0)

## 2013-02-25 LAB — POCT PREGNANCY, URINE: Preg Test, Ur: NEGATIVE

## 2013-02-25 LAB — URINE MICROSCOPIC-ADD ON

## 2013-02-25 MED ORDER — CIPROFLOXACIN HCL 500 MG PO TABS: 500.0000 mg | ORAL_TABLET | Freq: Two times a day (BID) | ORAL | Status: AC

## 2013-02-25 MED ORDER — CIPROFLOXACIN HCL 500 MG PO TABS: 500.0000 mg | ORAL_TABLET | Freq: Two times a day (BID) | ORAL | Status: DC

## 2013-02-25 NOTE — MAU Provider Note (Signed)
First Provider Initiated Contact with Patient 02/25/13 1542      Chief Complaint: c/o suprapubic pain, urgency;  Also has cough/congestion and sore throat since yesterday.  Has yellowish vaginal discharge  Christina Fox is  32 y.o. [id number].  No LMP recorded. Patient has had an implant..     Past Medical History  Diagnosis Date  . Headache(784.0)     sinus  . GERD (gastroesophageal reflux disease)   . Kidney stones     no current problem  . Anxiety   . Deviated septum 09/2011  . 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  . Complication of anesthesia     states small mouth  . Pelvic inflammatory disease     Past Surgical History  Procedure Laterality Date  . Polyps removed      colon  . Wisdom tooth extraction    . Nasal septoplasty w/ turbinoplasty  10/04/2011    Procedure: NASAL SEPTOPLASTY WITH TURBINATE REDUCTION;  Surgeon: Darletta Moll, MD;  Location: [facility];  Service: ENT;  Laterality: Bilateral;    Family History  Problem Relation Age of Onset  . Cancer Mother   . Hypertension Mother   . Hypertension Father   . Cancer Brother   . Hypertension Other     History  Substance Use Topics  . Smoking status: Former Smoker -- 0.25 packs/day    Types: Cigarettes  . Smokeless tobacco: Former Neurosurgeon    Quit date: 10/02/2008  . Alcohol Use: Yes     Comment: occasional    Allergies:  Allergies  Allergen Reactions  . Sulfa Antibiotics Hives and Rash    Prescriptions prior to admission  Medication Sig Dispense Refill  . clonazePAM (KLONOPIN) 1 MG tablet Take 1 mg by mouth daily. AM      . famotidine (PEPCID) 10 MG tablet Take 10 mg by mouth 2 (two) times daily. acid reflux      . guaiFENesin-dextromethorphan (ROBITUSSIN DM) 100-10 MG/5ML syrup Take 10 mLs by mouth as needed for cough.      . pseudoephedrine-guaifenesin (MUCINEX D) 60-600 MG per tablet Take 1 tablet by mouth every 12 (twelve) hours.      . Etonogestrel (IMPLANON) 68 MG IMPL  Inject into the skin once.          Review of Systems   Constitutional: Negative for fever and chills Eyes: Negative for visual disturbances Respiratory: Negative for shortness of breath, dyspnea Cardiovascular: Negative for chest pain or palpitations  Gastrointestinal: Negative for vomiting, diarrhea and constipation Genitourinary:POSITIVE for dysuria and urgency Musculoskeletal: Negative for back pain, joint pain, myalgias  Neurological: Negative for dizziness and headaches  .      Physical Exam   Blood pressure 140/80, pulse 79, temperature 98.1 F (36.7 C), temperature source Oral, resp. rate 16, SpO2 98.00%.  General: General appearance - alert, well appearing, and in no distress Chest - clear to auscultation, no wheezes, rales or rhonchi, symmetric air entry Heart - normal rate and regular rhythm Abdomen - soft, nontender, nondistended, no masses or organomegaly.  Suprapubic tenderness noted Pelvic - normal external genitalia, vulva, vagina, cervix, uterus and adnexa.  Normal appearing discharge, wet prep negative.  No blood at all in vagina Extremities - no pedal edema noted   Labs: Results for orders placed during the hospital encounter of 02/25/13 (from the past 24 hour(s))  URINALYSIS, ROUTINE W REFLEX MICROSCOPIC   Collection Time    02/25/13  3:03 PM  Result Value Range   Color, Urine YELLOW  YELLOW   APPearance CLEAR  CLEAR   Specific Gravity, Urine 1.025  1.005 - 1.030   pH 6.0  5.0 - 8.0   Glucose, UA NEGATIVE  NEGATIVE mg/dL   Hgb urine dipstick LARGE (*) NEGATIVE   Bilirubin Urine NEGATIVE  NEGATIVE   Ketones, ur NEGATIVE  NEGATIVE mg/dL   Protein, ur NEGATIVE  NEGATIVE mg/dL   Urobilinogen, UA 0.2  0.0 - 1.0 mg/dL   Nitrite NEGATIVE  NEGATIVE   Leukocytes, UA TRACE (*) NEGATIVE  URINE MICROSCOPIC-ADD ON   Collection Time    02/25/13  3:03 PM      Result Value Range   Squamous Epithelial / LPF RARE  RARE   WBC, UA 0-2  <3 WBC/hpf   RBC /  HPF 3-6  <3 RBC/hpf   Bacteria, UA FEW (*) RARE  POCT PREGNANCY, URINE   Collection Time    02/25/13  3:16 PM      Result Value Range   Preg Test, Ur NEGATIVE  NEGATIVE   Imaging Studies:  No results found.   Assessment:  Probable UTI  Plan: Treat with Cipro  CRESENZO-DISHMAN,Franci Oshana

## 2013-02-25 NOTE — MAU Note (Signed)
Pt states has implanon, doesn't usually spot, had spotting on/off last week. Lower abdominal pain began last pm, is constant. Does feel pressure when sitting to void, however no burning. Does have urgency as well. Does note yellow vaginal discharge. Also having cough/congestion, and throat is sore. Was not able to sleep last night from coughing; was unable to get comfortable.

## 2013-02-26 LAB — URINE CULTURE

## 2013-02-27 NOTE — MAU Provider Note (Signed)
Attestation of Attending Supervision of Advanced Practitioner (CNM/NP): Evaluation and management procedures were performed by the Advanced Practitioner under my supervision and collaboration.  I have reviewed the Advanced Practitioner's note and chart, and I agree with the management and plan.  Treylon Henard 02/27/2013 1:34 PM

## 2013-03-11 DIAGNOSIS — Z8742 Personal history of other diseases of the female genital tract: Secondary | ICD-10-CM | POA: Insufficient documentation

## 2013-03-11 DIAGNOSIS — J039 Acute tonsillitis, unspecified: Secondary | ICD-10-CM | POA: Insufficient documentation

## 2013-03-11 DIAGNOSIS — Z87891 Personal history of nicotine dependence: Secondary | ICD-10-CM | POA: Insufficient documentation

## 2013-03-11 DIAGNOSIS — Z87442 Personal history of urinary calculi: Secondary | ICD-10-CM | POA: Insufficient documentation

## 2013-03-11 DIAGNOSIS — R599 Enlarged lymph nodes, unspecified: Secondary | ICD-10-CM | POA: Insufficient documentation

## 2013-03-11 DIAGNOSIS — Z79899 Other long term (current) drug therapy: Secondary | ICD-10-CM | POA: Insufficient documentation

## 2013-03-11 DIAGNOSIS — Z8709 Personal history of other diseases of the respiratory system: Secondary | ICD-10-CM | POA: Insufficient documentation

## 2013-03-11 DIAGNOSIS — H9209 Otalgia, unspecified ear: Secondary | ICD-10-CM | POA: Insufficient documentation

## 2013-03-11 DIAGNOSIS — K219 Gastro-esophageal reflux disease without esophagitis: Secondary | ICD-10-CM | POA: Insufficient documentation

## 2013-03-11 DIAGNOSIS — F411 Generalized anxiety disorder: Secondary | ICD-10-CM | POA: Insufficient documentation

## 2013-03-12 ENCOUNTER — Emergency Department (HOSPITAL_COMMUNITY): Payer: Medicaid Other

## 2013-03-12 ENCOUNTER — Emergency Department (HOSPITAL_COMMUNITY)
Admission: EM | Admit: 2013-03-12 | Discharge: 2013-03-12 | Disposition: A | Discharge status: Home / Self Care | Payer: Self-pay | Attending: Emergency Medicine | Admitting: Emergency Medicine

## 2013-03-12 ENCOUNTER — Encounter (HOSPITAL_COMMUNITY): Payer: Self-pay | Admitting: Family Medicine

## 2013-03-12 DIAGNOSIS — J039 Acute tonsillitis, unspecified: Secondary | ICD-10-CM

## 2013-03-12 LAB — RAPID STREP SCREEN (MED CTR MEBANE ONLY): Streptococcus, Group A Screen (Direct): NEGATIVE

## 2013-03-12 MED ORDER — PENICILLIN G BENZATHINE 1200000 UNIT/2ML IM SUSP
1.2000 10*6.[IU] | Freq: Once | INTRAMUSCULAR | Status: AC
  Administered 2013-03-12: 1.2 10*6.[IU] via INTRAMUSCULAR
  Filled 2013-03-12: qty 2

## 2013-03-12 MED ORDER — PREDNISONE 20 MG PO TABS: 20.0000 mg | ORAL_TABLET | Freq: Two times a day (BID) | ORAL | Status: DC

## 2013-03-12 MED ORDER — CLINDAMYCIN HCL 300 MG PO CAPS: 300.0000 mg | ORAL_CAPSULE | Freq: Four times a day (QID) | ORAL | Status: DC

## 2013-03-12 MED ORDER — KETOROLAC TROMETHAMINE 60 MG/2ML IM SOLN
60.0000 mg | Freq: Once | INTRAMUSCULAR | Status: AC
  Administered 2013-03-12: 60 mg via INTRAMUSCULAR
  Filled 2013-03-12: qty 2

## 2013-03-12 MED ORDER — OXYCODONE-ACETAMINOPHEN 5-325 MG PO TABS: 2.0000 | ORAL_TABLET | ORAL | Status: DC | PRN

## 2013-03-12 NOTE — ED Notes (Signed)
Patient states that she has had a fever, sore throat and ear pain all day. States pain with swallowing. Took Ibuprofen and Cepacol without relief of symptoms.

## 2013-03-12 NOTE — ED Provider Notes (Signed)
CSN: [id number]     Arrival date & time 03/11/13  2320 History   First MD Initiated Contact with Patient 03/12/13 0252     Chief Complaint  Patient presents with  . Sore Throat  . Otalgia   (Consider location/radiation/quality/duration/timing/severity/associated sxs/prior Treatment) HPI History provided by pt.   Pt developed severe pain in L throat yesterday at 3am.  Associated w/ L-sided otalgia and tactile fever.  Denies sinus pressure, nasal congestion and cough.  Unable to eat/drink d/t pain. No known sick contacts.   Past Medical History  Diagnosis Date  . Headache(784.0)     sinus  . GERD (gastroesophageal reflux disease)   . Kidney stones     no current problem  . Anxiety   . Deviated septum 09/2011  . 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  . Complication of anesthesia     states small mouth  . Pelvic inflammatory disease    Past Surgical History  Procedure Laterality Date  . Polyps removed      colon  . Wisdom tooth extraction    . Nasal septoplasty w/ turbinoplasty  10/04/2011    Procedure: NASAL SEPTOPLASTY WITH TURBINATE REDUCTION;  Surgeon: Darletta Moll, MD;  Location: [facility];  Service: ENT;  Laterality: Bilateral;   Family History  Problem Relation Age of Onset  . Cancer Mother   . Hypertension Mother   . Hypertension Father   . Cancer Brother   . Hypertension Other    History  Substance Use Topics  . Smoking status: Former Smoker -- 0.25 packs/day    Types: Cigarettes  . Smokeless tobacco: Former Neurosurgeon    Quit date: 10/02/2008  . Alcohol Use: No   OB History   Grav Para Term Preterm Abortions TAB SAB Ect Mult Living   4 4 4       4      Review of Systems  All other systems reviewed and are negative.    Allergies  Sulfa antibiotics  Home Medications   Current Outpatient Rx  Name  Route  Sig  Dispense  Refill  . clonazePAM (KLONOPIN) 1 MG tablet   Oral   Take 1 mg by mouth daily. AM         . Etonogestrel  (IMPLANON) 68 MG IMPL   Subcutaneous   Inject into the skin once.          . famotidine (PEPCID) 10 MG tablet   Oral   Take 10 mg by mouth 2 (two) times daily. acid reflux         . guaiFENesin-dextromethorphan (ROBITUSSIN DM) 100-10 MG/5ML syrup   Oral   Take 10 mLs by mouth as needed for cough.         . pseudoephedrine-guaifenesin (MUCINEX D) 60-600 MG per tablet   Oral   Take 1 tablet by mouth every 12 (twelve) hours.          BP 142/76  Pulse 92  Temp(Src) 98.5 F (36.9 C) (Oral)  Resp 16  SpO2 100% Physical Exam  Nursing note and vitals reviewed. Constitutional: She is oriented to person, place, and time. She appears well-developed and well-nourished.  Uncomfortable appearing.    HENT:  Head: Normocephalic and atraumatic.  Nml TM and EAC bilaterally.  Trismus.  Uvula mid-line.  Erythema soft palate, bilateral tonsils and posterior pharynx.  Tonsils symmetrically enlarged.  No exudate.  Eyes:  Normal appearance  Neck: Normal range of motion.  Cardiovascular: Normal rate  and regular rhythm.   Pulmonary/Chest: Effort normal and breath sounds normal. No respiratory distress.  Musculoskeletal: Normal range of motion.  Lymphadenopathy:    She has cervical adenopathy.  Neurological: She is alert and oriented to person, place, and time.  Skin: Skin is warm and dry. No rash noted.  Psychiatric: She has a normal mood and affect. Her behavior is normal.    ED Course  Procedures (including critical care time) Labs Review Labs Reviewed  RAPID STREP SCREEN  CULTURE, GROUP A STREP   Imaging Review Ct Soft Tissue Neck Wo Contrast  03/12/2013   CLINICAL DATA:  Rule out peritonsillar abscess. Otalgia.  EXAM: CT NECK WITHOUT CONTRAST  TECHNIQUE: Multidetector CT imaging of the neck was performed following the standard protocol without intravenous contrast.  COMPARISON:  None.  FINDINGS: Tonsillar enlargement, both the adenoid and palatine tonsils, with asymmetric  enlargement of the left palatine compared to the right. Without contrast, cannot exclude peritonsillar or tonsillar abscess. No significant airway effacement. The parapharyngeal fat is preserved. Upper cervical lymphadenopathy, considered reactive.  There is a 10 mm nodule, low-density and noncalcified, in the upper left thyroid gland. No lower cervical adenopathy. No priors available to document stability.  The salivary glands are unremarkable. The apical lungs are clear. No acute osseous findings.  IMPRESSION: 1. Asymmetric left palatine tonsil enlargement. Cannot exclude tonsillar abscess on this examination due to non contrast technique. Recommend clinical followup to ensure normalization. 2. Solitary, 1 cm left upper thyroid nodule. Consider further evaluation with thyroid ultrasound, especially due to patient's young age.   Electronically Signed   By: Tiburcio Pea   On: 03/12/2013 05:34    MDM   1. Tonsillitis    32yo F presents w/ severe sore throat and L-sided otalgia.  On exam, afebrile, uncomfortable appearing, trismus, uvula mid-line, symmetrically enlarged tonsils, nml L ear.  Suspect otalgia is referred pain from throat.  Pt to receive bicillin and toradol.  Will re-examine shortly.  I have some concern for peritonsillar abscess but exam limited by trismus.  3:03 AM   CT shows asymmetrically enlarged L tonsil which could represent a peritonsillar abscess.  Results discussed w/ pt.  She is feeling a little bit better currently and is otherwise stable.  D/c'd home w/ clindamycin, percocet and prednisone.  She has an ENT physician and will contact him today for f/u.  Return precautions discussed.     Arie Sabina Yelina Sarratt, PA-C 03/12/13 1616

## 2013-03-13 ENCOUNTER — Emergency Department (HOSPITAL_COMMUNITY)
Admission: EM | Admit: 2013-03-13 | Discharge: 2013-03-13 | Disposition: A | Discharge status: Home / Self Care | Payer: Medicaid Other | Attending: Emergency Medicine | Admitting: Emergency Medicine

## 2013-03-13 ENCOUNTER — Encounter (HOSPITAL_COMMUNITY): Payer: Self-pay

## 2013-03-13 ENCOUNTER — Emergency Department (HOSPITAL_COMMUNITY): Payer: Medicaid Other

## 2013-03-13 DIAGNOSIS — Z79899 Other long term (current) drug therapy: Secondary | ICD-10-CM | POA: Insufficient documentation

## 2013-03-13 DIAGNOSIS — M542 Cervicalgia: Secondary | ICD-10-CM | POA: Insufficient documentation

## 2013-03-13 DIAGNOSIS — F411 Generalized anxiety disorder: Secondary | ICD-10-CM | POA: Insufficient documentation

## 2013-03-13 DIAGNOSIS — Z8709 Personal history of other diseases of the respiratory system: Secondary | ICD-10-CM | POA: Insufficient documentation

## 2013-03-13 DIAGNOSIS — K219 Gastro-esophageal reflux disease without esophagitis: Secondary | ICD-10-CM | POA: Insufficient documentation

## 2013-03-13 DIAGNOSIS — L040 Acute lymphadenitis of face, head and neck: Secondary | ICD-10-CM

## 2013-03-13 DIAGNOSIS — Z8742 Personal history of other diseases of the female genital tract: Secondary | ICD-10-CM | POA: Insufficient documentation

## 2013-03-13 DIAGNOSIS — L049 Acute lymphadenitis, unspecified: Secondary | ICD-10-CM | POA: Insufficient documentation

## 2013-03-13 DIAGNOSIS — J029 Acute pharyngitis, unspecified: Secondary | ICD-10-CM | POA: Insufficient documentation

## 2013-03-13 DIAGNOSIS — Z87442 Personal history of urinary calculi: Secondary | ICD-10-CM | POA: Insufficient documentation

## 2013-03-13 DIAGNOSIS — Z87891 Personal history of nicotine dependence: Secondary | ICD-10-CM | POA: Insufficient documentation

## 2013-03-13 DIAGNOSIS — Z792 Long term (current) use of antibiotics: Secondary | ICD-10-CM | POA: Insufficient documentation

## 2013-03-13 LAB — CBC WITH DIFFERENTIAL/PLATELET
Basophils Absolute: 0 10*3/uL (ref 0.0–0.1)
HCT: 37.2 % (ref 36.0–46.0)
Lymphocytes Relative: 2 % — ABNORMAL LOW (ref 12–46)
Lymphs Abs: 0.5 10*3/uL — ABNORMAL LOW (ref 0.7–4.0)
Monocytes Absolute: 1.2 10*3/uL — ABNORMAL HIGH (ref 0.1–1.0)
Neutro Abs: 18.7 10*3/uL — ABNORMAL HIGH (ref 1.7–7.7)
RBC: 4.21 MIL/uL (ref 3.87–5.11)
RDW: 12.3 % (ref 11.5–15.5)
WBC: 20.4 10*3/uL — ABNORMAL HIGH (ref 4.0–10.5)

## 2013-03-13 LAB — CULTURE, GROUP A STREP

## 2013-03-13 LAB — BASIC METABOLIC PANEL
CO2: 27 mEq/L (ref 19–32)
Chloride: 98 mEq/L (ref 96–112)
Creatinine, Ser: 0.54 mg/dL (ref 0.50–1.10)
Sodium: 136 mEq/L (ref 135–145)

## 2013-03-13 MED ORDER — SODIUM CHLORIDE 0.9 % IV BOLUS (SEPSIS): 1000.0000 mL | Freq: Once | INTRAVENOUS | Status: DC

## 2013-03-13 MED ORDER — ONDANSETRON HCL 4 MG/2ML IJ SOLN
4.0000 mg | Freq: Once | INTRAMUSCULAR | Status: DC
  Filled 2013-03-13: qty 2

## 2013-03-13 MED ORDER — CLINDAMYCIN PHOSPHATE 600 MG/50ML IV SOLN
600.0000 mg | Freq: Once | INTRAVENOUS | Status: DC
  Filled 2013-03-13: qty 50

## 2013-03-13 MED ORDER — MORPHINE SULFATE 4 MG/ML IJ SOLN
4.0000 mg | Freq: Once | INTRAMUSCULAR | Status: DC
  Filled 2013-03-13: qty 1

## 2013-03-13 MED ORDER — IOHEXOL 300 MG/ML  SOLN
100.0000 mL | Freq: Once | INTRAMUSCULAR | Status: AC | PRN
  Administered 2013-03-13: 100 mL via INTRAVENOUS

## 2013-03-13 MED ORDER — DEXAMETHASONE SODIUM PHOSPHATE 10 MG/ML IJ SOLN
10.0000 mg | Freq: Once | INTRAMUSCULAR | Status: DC
  Filled 2013-03-13: qty 1

## 2013-03-13 NOTE — ED Provider Notes (Signed)
Medical screening examination/treatment/procedure(s) were performed by non-physician practitioner and as supervising physician I was immediately available for consultation/collaboration.  Marijean Montanye R. Hinley Brimage, MD 03/13/13 2025 

## 2013-03-13 NOTE — ED Provider Notes (Signed)
CSN: [id number]     Arrival date & time 03/13/13  1112 History   First MD Initiated Contact with Patient 03/13/13 1126     Chief Complaint  Patient presents with  . Sore Throat  . Abscess   (Consider location/radiation/quality/duration/timing/severity/associated sxs/prior Treatment) HPI Comments: Patient is a 32 year old female who presents with a 3 day history of sore throat. Patient reports gradual onset and progressively worsening sharp, severe throat pain. The pain is constant and made worse with swallowing. The pain is localized to the patient's throat and worse on the left side. Nothing alleviates the pain. The patient was seen in the ED 2 days ago for the same symptoms. She had a CT scan of her neck which showed no tonsillar abscess. Patient was given antibiotics and patient medication. Patient reports worsening of her symptoms since she was seen 2 days ago. Patient reports associated subjective fever, cervical adenopathy, and difficulty swallowing. Patient denies headache, visual changes, sinus congestion, difficulty breathing, chest pain, SOB, abdominal pain, NVD.      Past Medical History  Diagnosis Date  . Headache(784.0)     sinus  . GERD (gastroesophageal reflux disease)   . Kidney stones     no current problem  . Anxiety   . Deviated septum 09/2011  . 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  . Complication of anesthesia     states small mouth  . Pelvic inflammatory disease    Past Surgical History  Procedure Laterality Date  . Polyps removed      colon  . Wisdom tooth extraction    . Nasal septoplasty w/ turbinoplasty  10/04/2011    Procedure: NASAL SEPTOPLASTY WITH TURBINATE REDUCTION;  Surgeon: Darletta Moll, MD;  Location: [facility];  Service: ENT;  Laterality: Bilateral;   Family History  Problem Relation Age of Onset  . Cancer Mother   . Hypertension Mother   . Hypertension Father   . Cancer Brother   . Hypertension Other    History   Substance Use Topics  . Smoking status: Former Smoker -- 0.25 packs/day    Types: Cigarettes  . Smokeless tobacco: Former Neurosurgeon    Quit date: 10/02/2008  . Alcohol Use: No   OB History   Grav Para Term Preterm Abortions TAB SAB Ect Mult Living   4 4 4       4      Review of Systems  HENT: Positive for sore throat and neck pain.   All other systems reviewed and are negative.    Allergies  Sulfa antibiotics  Home Medications   Current Outpatient Rx  Name  Route  Sig  Dispense  Refill  . clindamycin (CLEOCIN) 150 MG capsule   Oral   Take 300 mg by mouth 4 (four) times daily.         . clonazePAM (KLONOPIN) 1 MG tablet   Oral   Take 1 mg by mouth daily. AM         . Etonogestrel (IMPLANON) 68 MG IMPL   Subcutaneous   Inject into the skin once.          . famotidine (PEPCID) 10 MG tablet   Oral   Take 10 mg by mouth 2 (two) times daily. acid reflux         . guaiFENesin-dextromethorphan (ROBITUSSIN DM) 100-10 MG/5ML syrup   Oral   Take 10 mLs by mouth as needed for cough.         Marland Kitchen  oxyCODONE-acetaminophen (PERCOCET/ROXICET) 5-325 MG per tablet   Oral   Take 2 tablets by mouth every 4 (four) hours as needed for pain.   6 tablet   0   . pseudoephedrine-guaifenesin (MUCINEX D) 60-600 MG per tablet   Oral   Take 1 tablet by mouth every 12 (twelve) hours.          BP 146/82  Pulse 77  Temp(Src) 98.2 F (36.8 C) (Oral)  Resp 17  SpO2 99% Physical Exam  Nursing note and vitals reviewed. Constitutional: She is oriented to person, place, and time. She appears well-developed and well-nourished. No distress.  HENT:  Head: Normocephalic and atraumatic.  Posterior pharynx shows exudate and tonsillar erythema. Trismus.  Eyes: Conjunctivae and EOM are normal. Pupils are equal, round, and reactive to light.  Neck: Normal range of motion.  Left neck tenderness to palpation with associated cervical adenopathy.   Cardiovascular: Normal rate and regular  rhythm.  Exam reveals no gallop and no friction rub.   No murmur heard. Pulmonary/Chest: Effort normal and breath sounds normal. She has no wheezes. She has no rales. She exhibits no tenderness.  Musculoskeletal: Normal range of motion.  Lymphadenopathy:    She has cervical adenopathy.  Neurological: She is alert and oriented to person, place, and time. Coordination normal.  Speech is goal-oriented. Moves limbs without ataxia.   Skin: Skin is warm and dry.  Psychiatric: She has a normal mood and affect. Her behavior is normal.    ED Course  Procedures (including critical care time) Labs Review Labs Reviewed  CBC WITH DIFFERENTIAL - Abnormal; Notable for the following:    WBC 20.4 (*)    Neutrophils Relative % 92 (*)    Neutro Abs 18.7 (*)    Lymphocytes Relative 2 (*)    Lymphs Abs 0.5 (*)    Monocytes Absolute 1.2 (*)    All other components within normal limits  BASIC METABOLIC PANEL - Abnormal; Notable for the following:    Glucose, Bld 132 (*)    All other components within normal limits   Imaging Review Ct Soft Tissue Neck Wo Contrast  03/12/2013   CLINICAL DATA:  Rule out peritonsillar abscess. Otalgia.  EXAM: CT NECK WITHOUT CONTRAST  TECHNIQUE: Multidetector CT imaging of the neck was performed following the standard protocol without intravenous contrast.  COMPARISON:  None.  FINDINGS: Tonsillar enlargement, both the adenoid and palatine tonsils, with asymmetric enlargement of the left palatine compared to the right. Without contrast, cannot exclude peritonsillar or tonsillar abscess. No significant airway effacement. The parapharyngeal fat is preserved. Upper cervical lymphadenopathy, considered reactive.  There is a 10 mm nodule, low-density and noncalcified, in the upper left thyroid gland. No lower cervical adenopathy. No priors available to document stability.  The salivary glands are unremarkable. The apical lungs are clear. No acute osseous findings.  IMPRESSION: 1.  Asymmetric left palatine tonsil enlargement. Cannot exclude tonsillar abscess on this examination due to non contrast technique. Recommend clinical followup to ensure normalization. 2. Solitary, 1 cm left upper thyroid nodule. Consider further evaluation with thyroid ultrasound, especially due to patient's young age.   Electronically Signed   By: Tiburcio Pea   On: 03/12/2013 05:34   Ct Soft Tissue Neck W Contrast  03/13/2013   CLINICAL DATA:  Left-sided neck swelling. Dysphagia. Abscess. Subsequent encounter  EXAM: CT NECK WITH CONTRAST  TECHNIQUE: Multidetector CT imaging of the neck was performed using the standard protocol following the bolus administration of intravenous contrast.  CONTRAST:  OMNIPAQUE IOHEXOL 300 MG/ML  SOLN  COMPARISON:  CT neck without contrast 03/12/2013.  FINDINGS: A peripherally enhancing low-density collection within or adjacent to the left palatine tonsil measures 16 x 8 x 36 mm and extends inferiorly to the vallecula. Left palatine tonsil is enlarged. The airway is displaced to the right. There is mild enlargement of the right palatine tonsil with scattered calcifications, suggesting a history of prior infection is well.  Enlarged level 2 lymph nodes are present bilaterally, left greater than right. These are likely reactive.  No other focal mucosal or submucosal lesions are evident. An 11 mm left thyroid nodule is evident. The upper mediastinum is within normal limits. The lung apices are clear.  The bone windows did demonstrate no focal lytic or blastic lesions.  IMPRESSION: 1. 16 x 8 x 36 mm peripherally enhancing hypodense collection within or adjacent to the left palatine tonsil represents an developing abscess. 2. Bilateral level 2 lymph nodes are likely reactive, left greater than right. 3. Evidence for prior infection in the right palatine tonsil.   Electronically Signed   By: Gennette Pac   On: 03/13/2013 13:13    MDM   1. Cervical lymph node abscess      11:32 AM Labs and CT neck pending to rule out abscess. Patient had a CT neck yesterday but patient states she is getting worse after receiving antibiotics and pain medication. Vitals stable and patient afebrile.   1:47 PM Patient has elevated WBC and CT shows abscess adjacent to the left palatine tonsil. Patient will have IV Clindamycin, decadron and morphine and zofran. I will consult ENT.   2:00 PM I spoke with Dr. Raye Sorrow nurse who instructed me to discharge the patient and send her to the office to drain the abscess.     Emilia Beck, PA-C 03/13/13 1402

## 2013-03-13 NOTE — Progress Notes (Addendum)
[facility] ED Cm noted ED nurse note: Sts "I dont think I have insurance so I couldn't follow up. I was told to return here, if it got worse." Sts taking medication as prescribed w/o relief [facility] ED CM noted pt verified for  Medicaid per registration CM spoke with pt to get clarity Pt states she was informed she only had family planning medicaid and can only see a medicaid provided for ob-gyn issues.   CM discussed with pt that it is correct there are various types of medicaid coverage and with Medicaid Be Smart Family Planning program the services are primary for family planning other acute or chronic medical issues Pt voiced understanding  CM provided a list of [place] medicaid ENTs and request she speak with a DSS staff member (and take the list of ENTs with her) about possibility of coverage and to speak with the billing office of the ENT about payment arrangements.   ED PA states pt is to go to see Dr Flo Shanks [address] [phone] Cm circled the medicaid provider on the sheets provided to pt She voiced understanding and appreciation of services rendered  1415 Cm provided pt with written information about the medicaid be smart family planning program (what is and is not covered, how to apply and who to speak to about further information) Pt voiced appreciation

## 2013-03-13 NOTE — ED Provider Notes (Signed)
Medical screening examination/treatment/procedure(s) were performed by non-physician practitioner and as supervising physician I was immediately available for consultation/collaboration.   Dawud Mays, MD 03/13/13 1455 

## 2013-03-13 NOTE — ED Notes (Addendum)
Pt c/o of sore throat, difficulty swallowing and abscess on tonsil.  Pain score 10/10.  Pt sts she was seen at [facility] x 2 days ago for same complaint.  Sts she was diagnosed with an abscess on her tonsil and told to follow up with ENT.  Sts "I dont think I have insurance so I couldn't follow up.  I was told to return here, if it got worse."  Sts taking medication as prescribed w/o relief.

## 2013-03-14 ENCOUNTER — Other Ambulatory Visit: Payer: Self-pay | Admitting: Otolaryngology

## 2013-03-14 DIAGNOSIS — E041 Nontoxic single thyroid nodule: Secondary | ICD-10-CM

## 2013-03-21 ENCOUNTER — Other Ambulatory Visit: Payer: Self-pay | Admitting: Otolaryngology

## 2013-03-21 DIAGNOSIS — E041 Nontoxic single thyroid nodule: Secondary | ICD-10-CM

## 2013-03-26 ENCOUNTER — Ambulatory Visit
Admission: RE | Admit: 2013-03-26 | Discharge: 2013-03-26 | Disposition: A | Discharge status: Home / Self Care | Payer: Medicaid Other | Source: Ambulatory Visit | Attending: Otolaryngology | Admitting: Otolaryngology

## 2013-03-26 ENCOUNTER — Other Ambulatory Visit (HOSPITAL_COMMUNITY)
Admission: RE | Admit: 2013-03-26 | Discharge: 2013-03-26 | Disposition: A | Discharge status: Home / Self Care | Payer: Medicaid Other | Source: Ambulatory Visit | Attending: Interventional Radiology | Admitting: Interventional Radiology

## 2013-03-26 DIAGNOSIS — E041 Nontoxic single thyroid nodule: Secondary | ICD-10-CM | POA: Insufficient documentation

## 2013-04-22 ENCOUNTER — Encounter (HOSPITAL_BASED_OUTPATIENT_CLINIC_OR_DEPARTMENT_OTHER): Payer: Self-pay

## 2013-04-22 ENCOUNTER — Ambulatory Visit (HOSPITAL_BASED_OUTPATIENT_CLINIC_OR_DEPARTMENT_OTHER): Admit: 2013-04-22 | Payer: Self-pay | Admitting: Otolaryngology

## 2013-04-22 SURGERY — EXCISION, NASAL TURBINATE, SUBMUCOSAL
Anesthesia: General

## 2013-05-02 ENCOUNTER — Other Ambulatory Visit: Payer: Self-pay

## 2013-07-08 ENCOUNTER — Encounter (HOSPITAL_COMMUNITY): Payer: Self-pay | Admitting: Family

## 2013-07-08 ENCOUNTER — Inpatient Hospital Stay (HOSPITAL_COMMUNITY)
Admission: AD | Admit: 2013-07-08 | Discharge: 2013-07-08 | Disposition: A | Discharge status: Home / Self Care | Payer: Medicaid Other | Source: Ambulatory Visit | Attending: Obstetrics & Gynecology | Admitting: Obstetrics & Gynecology

## 2013-07-08 DIAGNOSIS — Z87891 Personal history of nicotine dependence: Secondary | ICD-10-CM | POA: Insufficient documentation

## 2013-07-08 DIAGNOSIS — Z3202 Encounter for pregnancy test, result negative: Secondary | ICD-10-CM

## 2013-07-08 DIAGNOSIS — R112 Nausea with vomiting, unspecified: Secondary | ICD-10-CM | POA: Insufficient documentation

## 2013-07-08 LAB — POCT PREGNANCY, URINE: Preg Test, Ur: NEGATIVE

## 2013-07-08 LAB — HCG, QUANTITATIVE, PREGNANCY: hCG, Beta Chain, Quant, S: <1 m[IU]/mL (ref <5)

## 2013-07-08 NOTE — MAU Note (Signed)
Pt states thinks she's pregnant b/c has gained 20 lbs in last month, "stomach has been swollen", nausea, & breast pain. Negative UPT at home a few days ago. Has implanon that is supposed to be removed in March; doesn't have a period. Has some vaginal spotting for 2 weeks in December. Denies fever, cough or sore throat.

## 2013-07-08 NOTE — MAU Provider Note (Signed)
History     CSN: [id number]  Arrival date and time: 07/08/13 0104   First Provider Initiated Contact with Patient 07/08/13 0224      Chief Complaint  Patient presents with  . Possible Pregnancy   HPI  Pt is 33 yo [id number] here for pregnancy test.  Report +nausea and vomiting.  No LMP recorded. Patient has had an implant.  Requests blood test to confirm negative urine HCG.  Past Medical History  Diagnosis Date  . Headache(784.0)     sinus  . GERD (gastroesophageal reflux disease)   . Kidney stones     no current problem  . Anxiety   . Deviated septum 09/2011  . 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  . Complication of anesthesia     states small mouth  . Pelvic inflammatory disease     Past Surgical History  Procedure Laterality Date  . Polyps removed      colon  . Wisdom tooth extraction    . Nasal septoplasty w/ turbinoplasty  10/04/2011    Procedure: NASAL SEPTOPLASTY WITH TURBINATE REDUCTION;  Surgeon: Ascencion Dike, MD;  Location: [facility];  Service: ENT;  Laterality: Bilateral;    Family History  Problem Relation Age of Onset  . Cancer Mother   . Hypertension Mother   . Hypertension Father   . Cancer Brother   . Hypertension Other     History  Substance Use Topics  . Smoking status: Former Smoker -- 0.25 packs/day    Types: Cigarettes  . Smokeless tobacco: Former Systems developer    Quit date: 10/02/2008  . Alcohol Use: No    Allergies:  Allergies  Allergen Reactions  . Sulfa Antibiotics Hives and Rash    Prescriptions prior to admission  Medication Sig Dispense Refill  . clonazePAM (KLONOPIN) 1 MG tablet Take 1 mg by mouth daily. AM      . Etonogestrel (IMPLANON) 68 MG IMPL Inject into the skin once.       . famotidine (PEPCID) 10 MG tablet Take 10 mg by mouth 2 (two) times daily. acid reflux      . oxyCODONE-acetaminophen (PERCOCET/ROXICET) 5-325 MG per tablet Take 2 tablets by mouth every 4 (four) hours as needed for pain.      .  predniSONE (DELTASONE) 20 MG tablet Take 20 mg by mouth 2 (two) times daily.      . pseudoephedrine-guaifenesin (MUCINEX D) 60-600 MG per tablet Take 1 tablet by mouth 2 (two) times daily as needed for congestion.         Review of Systems  Constitutional: Negative for weight loss.  Gastrointestinal: Positive for nausea and vomiting. Negative for abdominal pain.  All other systems reviewed and are negative.   Physical Exam   Blood pressure 132/82, pulse 79, temperature 97.7 F (36.5 C), temperature source Oral, resp. rate 18, height 5' 3.5" (1.613 m), weight 87.454 kg (192 lb 12.8 oz), SpO2 100.00%.  Physical Exam  Constitutional: She is oriented to person, place, and time. She appears well-developed and well-nourished. No distress.  HENT:  Head: Normocephalic.  Neck: Normal range of motion. Neck supple.  Cardiovascular: Normal rate, regular rhythm and normal heart sounds.   Respiratory: Effort normal and breath sounds normal. No respiratory distress.  Musculoskeletal: Normal range of motion. She exhibits no edema.  Neurological: She is alert and oriented to person, place, and time. She has normal reflexes.  Skin: Skin is warm and dry.    MAU Course  Procedures Results for orders placed during the hospital encounter of 07/08/13 (from the past 24 hour(s))  POCT PREGNANCY, URINE     Status: None   Collection Time    07/08/13  1:26 AM      Result Value Range   Preg Test, Ur NEGATIVE  NEGATIVE  HCG, QUANTITATIVE, PREGNANCY     Status: None   Collection Time    07/08/13  1:30 AM      Result Value Range   hCG, Beta Chain, Quant, S <1  <5 mIU/mL     Assessment and Plan  Negative Pregnancy Test  Plan: Discharge to home Discussed possible side effects of Nexplanon Follow-up with PCP regarding nausea and vomiting.    [facility] 07/08/2013, 2:26 AM

## 2013-08-13 ENCOUNTER — Ambulatory Visit: Payer: Self-pay | Admitting: Obstetrics

## 2013-08-19 ENCOUNTER — Ambulatory Visit: Payer: Medicaid Other | Admitting: Obstetrics

## 2013-08-20 ENCOUNTER — Encounter (HOSPITAL_COMMUNITY): Payer: Self-pay | Admitting: Emergency Medicine

## 2013-08-20 ENCOUNTER — Emergency Department (HOSPITAL_COMMUNITY)
Admission: EM | Admit: 2013-08-20 | Discharge: 2013-08-20 | Disposition: A | Discharge status: Home / Self Care | Payer: Medicaid Other | Attending: Emergency Medicine | Admitting: Emergency Medicine

## 2013-08-20 DIAGNOSIS — M549 Dorsalgia, unspecified: Secondary | ICD-10-CM

## 2013-08-20 DIAGNOSIS — F411 Generalized anxiety disorder: Secondary | ICD-10-CM | POA: Insufficient documentation

## 2013-08-20 DIAGNOSIS — Z8709 Personal history of other diseases of the respiratory system: Secondary | ICD-10-CM | POA: Insufficient documentation

## 2013-08-20 DIAGNOSIS — Z8719 Personal history of other diseases of the digestive system: Secondary | ICD-10-CM | POA: Insufficient documentation

## 2013-08-20 DIAGNOSIS — Z87891 Personal history of nicotine dependence: Secondary | ICD-10-CM | POA: Insufficient documentation

## 2013-08-20 DIAGNOSIS — Z87442 Personal history of urinary calculi: Secondary | ICD-10-CM | POA: Insufficient documentation

## 2013-08-20 DIAGNOSIS — Z3202 Encounter for pregnancy test, result negative: Secondary | ICD-10-CM | POA: Insufficient documentation

## 2013-08-20 DIAGNOSIS — Z8742 Personal history of other diseases of the female genital tract: Secondary | ICD-10-CM | POA: Insufficient documentation

## 2013-08-20 DIAGNOSIS — Z79899 Other long term (current) drug therapy: Secondary | ICD-10-CM | POA: Insufficient documentation

## 2013-08-20 DIAGNOSIS — M545 Low back pain, unspecified: Secondary | ICD-10-CM | POA: Insufficient documentation

## 2013-08-20 DIAGNOSIS — R109 Unspecified abdominal pain: Secondary | ICD-10-CM | POA: Insufficient documentation

## 2013-08-20 LAB — URINALYSIS, ROUTINE W REFLEX MICROSCOPIC
BILIRUBIN URINE: NEGATIVE
GLUCOSE, UA: NEGATIVE mg/dL
HGB URINE DIPSTICK: NEGATIVE
KETONES UR: NEGATIVE mg/dL
LEUKOCYTES UA: NEGATIVE
Nitrite: NEGATIVE
PH: 7.5 (ref 5.0–8.0)
PROTEIN: NEGATIVE mg/dL
Specific Gravity, Urine: 1.015 (ref 1.005–1.030)
Urobilinogen, UA: 0.2 mg/dL (ref 0.0–1.0)

## 2013-08-20 LAB — POC URINE PREG, ED: Preg Test, Ur: NEGATIVE

## 2013-08-20 MED ORDER — IBUPROFEN 600 MG PO TABS: 600.0000 mg | ORAL_TABLET | Freq: Four times a day (QID) | ORAL | Status: DC | PRN

## 2013-08-20 MED ORDER — OXYCODONE-ACETAMINOPHEN 5-325 MG PO TABS: 1.0000 | ORAL_TABLET | Freq: Four times a day (QID) | ORAL | Status: DC | PRN

## 2013-08-20 MED ORDER — KETOROLAC TROMETHAMINE 60 MG/2ML IM SOLN
60.0000 mg | Freq: Once | INTRAMUSCULAR | Status: AC
  Administered 2013-08-20: 60 mg via INTRAMUSCULAR
  Filled 2013-08-20: qty 2

## 2013-08-20 MED ORDER — DIAZEPAM 5 MG PO TABS
5.0000 mg | ORAL_TABLET | Freq: Once | ORAL | Status: AC
  Administered 2013-08-20: 5 mg via ORAL
  Filled 2013-08-20: qty 1

## 2013-08-20 MED ORDER — HYDROMORPHONE HCL PF 1 MG/ML IJ SOLN
1.0000 mg | Freq: Once | INTRAMUSCULAR | Status: AC
  Administered 2013-08-20: 1 mg via INTRAMUSCULAR
  Filled 2013-08-20: qty 1

## 2013-08-20 MED ORDER — DIAZEPAM 5 MG PO TABS: 5.0000 mg | ORAL_TABLET | Freq: Four times a day (QID) | ORAL | Status: DC | PRN

## 2013-08-20 NOTE — ED Provider Notes (Signed)
CSN: [id number]     Arrival date & time 08/20/13  1047 History   First MD Initiated Contact with Patient 08/20/13 1254     Chief Complaint  Patient presents with  . Back Pain  . Abdominal Pain     (Consider location/radiation/quality/duration/timing/severity/associated sxs/prior Treatment) Patient is a 33 y.o. female presenting with back pain and abdominal pain. The history is provided by the patient.  Back Pain Location:  Lumbar spine Associated symptoms: abdominal pain   Associated symptoms: no chest pain, no headaches, no numbness and no weakness   Abdominal Pain Associated symptoms: no chest pain, no diarrhea, no nausea, no shortness of breath and no vomiting    patient with lower abdominal and back pain began this morning. It is worse with movements. She states she had to have help going down the stairs. No dysuria. No trauma. She states she has an episode like this about every 3 months. She's been seen for previously and been told it was ovarian cysts or sciatica. No dysuria. She denies possibility of pregnancy. No vaginal bleeding or discharge. She states the pain began on her lower abdomen and worked its way down to the back.  Past Medical History  Diagnosis Date  . Headache(784.0)     sinus  . GERD (gastroesophageal reflux disease)   . Kidney stones     no current problem  . Anxiety   . Deviated septum 09/2011  . 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  . Complication of anesthesia     states small mouth  . Pelvic inflammatory disease    Past Surgical History  Procedure Laterality Date  . Polyps removed      colon  . Wisdom tooth extraction    . Nasal septoplasty w/ turbinoplasty  10/04/2011    Procedure: NASAL SEPTOPLASTY WITH TURBINATE REDUCTION;  Surgeon: Ascencion Dike, MD;  Location: [facility];  Service: ENT;  Laterality: Bilateral;   Family History  Problem Relation Age of Onset  . Cancer Mother   . Hypertension Mother   . Hypertension  Father   . Cancer Brother   . Hypertension Other    History  Substance Use Topics  . Smoking status: Former Smoker -- 0.25 packs/day    Types: Cigarettes  . Smokeless tobacco: Former Systems developer    Quit date: 10/02/2008  . Alcohol Use: No   OB History   Grav Para Term Preterm Abortions TAB SAB Ect Mult Living   4 4 4       4      Review of Systems  Constitutional: Negative for activity change and appetite change.  Eyes: Negative for pain.  Respiratory: Negative for chest tightness and shortness of breath.   Cardiovascular: Negative for chest pain and leg swelling.  Gastrointestinal: Positive for abdominal pain. Negative for nausea, vomiting and diarrhea.  Genitourinary: Negative for flank pain.  Musculoskeletal: Positive for back pain. Negative for neck stiffness.  Skin: Negative for rash.  Neurological: Negative for weakness, numbness and headaches.  Psychiatric/Behavioral: Negative for behavioral problems.      Allergies  Sulfa antibiotics  Home Medications   Current Outpatient Rx  Name  Route  Sig  Dispense  Refill  . clonazePAM (KLONOPIN) 1 MG tablet   Oral   Take 1 mg by mouth daily. AM         . Etonogestrel (IMPLANON) 68 MG IMPL   Subcutaneous   Inject into the skin once.          Marland Kitchen  vitamin C (ASCORBIC ACID) 500 MG tablet   Oral   Take 500 mg by mouth daily.         . diazepam (VALIUM) 5 MG tablet   Oral   Take 1 tablet (5 mg total) by mouth every 6 (six) hours as needed for muscle spasms.   10 tablet   0   . ibuprofen (ADVIL,MOTRIN) 600 MG tablet   Oral   Take 1 tablet (600 mg total) by mouth every 6 (six) hours as needed.   20 tablet   0   . oxyCODONE-acetaminophen (PERCOCET/ROXICET) 5-325 MG per tablet   Oral   Take 1-2 tablets by mouth every 6 (six) hours as needed for severe pain.   10 tablet   0    BP 137/78  Pulse 68  Temp(Src) 97.4 F (36.3 C) (Oral)  Resp 16  SpO2 96% Physical Exam  Constitutional: She appears well-developed  and well-nourished.  Eyes: Pupils are equal, round, and reactive to light.  Cardiovascular: Normal rate and regular rhythm.   Pulmonary/Chest: Effort normal and breath sounds normal.  Abdominal: Soft. There is no tenderness.  Musculoskeletal: She exhibits tenderness.  Mild right lumbar paraspinal tenderness. No rash. No CVA tenderness. Some pain with straight leg raise on right. Sensation intact distally. Strength intact in both lower extremities.  Neurological: She is alert.  Skin: Skin is warm and dry.    ED Course  Procedures (including critical care time) Labs Review Labs Reviewed  URINALYSIS, ROUTINE W REFLEX MICROSCOPIC - Abnormal; Notable for the following:    APPearance CLOUDY (*)    All other components within normal limits  POC URINE PREG, ED   Imaging Review No results found.  EKG Interpretation   None       MDM   Final diagnoses:  Back pain    Patient with back pain. Likely musculoskeletal. Has had episodes like this every 3 months. Urine is reassuring. Abdominal exam is benign. No red flag for the back pain. Will discharge home with pain medicines.    Jasper Riling. Alvino Chapel, MD 08/21/13 [phone]

## 2013-08-20 NOTE — Progress Notes (Signed)
   CARE MANAGEMENT ED NOTE 08/20/2013  Patient:  Christina Fox, Christina Fox   Account Number:  [id number]  Date Initiated:  08/20/2013  Documentation initiated by:  Jackelyn Poling  Subjective/Objective Assessment:   33 yr female medicaid of [place] pt who [facility] ED CM spoke with on 03/13/13 & provided resources In ED with c/o sciatic pain, states has this pain every 3 months, denies injury, c/o bilateral leg pain, unable to walk     Subjective/Objective Assessment Detail:   States she completed affordable care act registration but informed co pay for insurance coverage would be $200 & she had "too much income coming in the home"  pcp listed as [facility]  She informed CM "someone came in the ED" "was given [employer] card by [employer] Education officer, museum to help get medicines"  Inquired about assistance with paying her bill "[place]" CM referred her to [facility] ED registration     Action/Plan:   CM spoke with pt and review resources for [employer] CM provided pt with [employer] community assistance program Rx drug discount card   Action/Plan Detail:   Anticipated DC Date:  08/20/2013     Status Recommendation to Physician:   Result of Recommendation:    Other ED Services  Consult [facility] [place]  Other  Outpatient Services - Pt will follow up    Choice offered to / List presented to:            Status of service:  Completed, signed off  ED Comments:   ED Comments Detail:   CM discussed and provided written information for self pay pcps, importance of pcp for f/u care, www.needymeds.org, discounted pharmacies and other [employer] such as financial assistance, DSS and  health department  Reviewed resources for [employer] self pay pcps like [employer], family medicine at [address], [facility] family practice, general medical clinics, [facility] urgent care plus others, [facility] out patient pharmacies and housing Pt voiced understanding and appreciation of resources provided   Provided [facility] contact  information

## 2013-08-20 NOTE — ED Notes (Signed)
Per EMS, pt c/o sciatic pain, states has this pain every 3 months, denies injury, c/o bilateral leg pain, unable to walk

## 2013-08-20 NOTE — ED Notes (Signed)
Pt states sharp pain sudden this am to left lower abdomen radiating to back.  Pt states she cannot lie down.  Pain in lower back continues now.

## 2013-08-20 NOTE — Discharge Instructions (Signed)
Back Pain, Adult Low back pain is very common. About 1 in 5 people have back pain.The cause of low back pain is rarely dangerous. The pain often gets better over time.About half of people with a sudden onset of back pain feel better in just 2 weeks. About 8 in 10 people feel better by 6 weeks.  CAUSES Some common causes of back pain include:  Strain of the muscles or ligaments supporting the spine.  Wear and tear (degeneration) of the spinal discs.  Arthritis.  Direct injury to the back. DIAGNOSIS Most of the time, the direct cause of low back pain is not known.However, back pain can be treated effectively even when the exact cause of the pain is unknown.Answering your caregiver's questions about your overall health and symptoms is one of the most accurate ways to make sure the cause of your pain is not dangerous. If your caregiver needs more information, he or she may order lab work or imaging tests (X-rays or MRIs).However, even if imaging tests show changes in your back, this usually does not require surgery. HOME CARE INSTRUCTIONS For many people, back pain returns.Since low back pain is rarely dangerous, it is often a condition that people can learn to manageon their own.   Remain active. It is stressful on the back to sit or stand in one place. Do not sit, drive, or stand in one place for more than 30 minutes at a time. Take short walks on level surfaces as soon as pain allows.Try to increase the length of time you walk each day.  Do not stay in bed.Resting more than 1 or 2 days can delay your recovery.  Do not avoid exercise or work.Your body is made to move.It is not dangerous to be active, even though your back may hurt.Your back will likely heal faster if you return to being active before your pain is gone.  Pay attention to your body when you bend and lift. Many people have less discomfortwhen lifting if they bend their knees, keep the load close to their bodies,and  avoid twisting. Often, the most comfortable positions are those that put less stress on your recovering back.  Find a comfortable position to sleep. Use a firm mattress and lie on your side with your knees slightly bent. If you lie on your back, put a pillow under your knees.  Only take over-the-counter or prescription medicines as directed by your caregiver. Over-the-counter medicines to reduce pain and inflammation are often the most helpful.Your caregiver may prescribe muscle relaxant drugs.These medicines help dull your pain so you can more quickly return to your normal activities and healthy exercise.  Put ice on the injured area.  Put ice in a plastic bag.  Place a towel between your skin and the bag.  Leave the ice on for 15-20 minutes, 03-04 times a day for the first 2 to 3 days. After that, ice and heat may be alternated to reduce pain and spasms.  Ask your caregiver about trying back exercises and gentle massage. This may be of some benefit.  Avoid feeling anxious or stressed.Stress increases muscle tension and can worsen back pain.It is important to recognize when you are anxious or stressed and learn ways to manage it.Exercise is a great option. SEEK MEDICAL CARE IF:  You have pain that is not relieved with rest or medicine.  You have pain that does not improve in 1 week.  You have new symptoms.  You are generally not feeling well. SEEK   IMMEDIATE MEDICAL CARE IF:   You have pain that radiates from your back into your legs.  You develop new bowel or bladder control problems.  You have unusual weakness or numbness in your arms or legs.  You develop nausea or vomiting.  You develop abdominal pain.  You feel faint. Document Released: 06/13/2005 Document Revised: 12/13/2011 Document Reviewed: 11/01/2010 [employer] Patient Information 2014 [employer]  

## 2013-08-25 ENCOUNTER — Emergency Department (HOSPITAL_COMMUNITY)
Admission: EM | Admit: 2013-08-25 | Discharge: 2013-08-25 | Disposition: A | Discharge status: Home / Self Care | Payer: Medicaid Other | Attending: Emergency Medicine | Admitting: Emergency Medicine

## 2013-08-25 ENCOUNTER — Encounter (HOSPITAL_COMMUNITY): Payer: Self-pay | Admitting: Emergency Medicine

## 2013-08-25 DIAGNOSIS — A499 Bacterial infection, unspecified: Secondary | ICD-10-CM | POA: Insufficient documentation

## 2013-08-25 DIAGNOSIS — Z8719 Personal history of other diseases of the digestive system: Secondary | ICD-10-CM | POA: Insufficient documentation

## 2013-08-25 DIAGNOSIS — Z79899 Other long term (current) drug therapy: Secondary | ICD-10-CM | POA: Insufficient documentation

## 2013-08-25 DIAGNOSIS — Z8709 Personal history of other diseases of the respiratory system: Secondary | ICD-10-CM | POA: Insufficient documentation

## 2013-08-25 DIAGNOSIS — M549 Dorsalgia, unspecified: Secondary | ICD-10-CM

## 2013-08-25 DIAGNOSIS — N760 Acute vaginitis: Secondary | ICD-10-CM | POA: Insufficient documentation

## 2013-08-25 DIAGNOSIS — R1110 Vomiting, unspecified: Secondary | ICD-10-CM | POA: Insufficient documentation

## 2013-08-25 DIAGNOSIS — Z87442 Personal history of urinary calculi: Secondary | ICD-10-CM | POA: Insufficient documentation

## 2013-08-25 DIAGNOSIS — Z87891 Personal history of nicotine dependence: Secondary | ICD-10-CM | POA: Insufficient documentation

## 2013-08-25 DIAGNOSIS — F411 Generalized anxiety disorder: Secondary | ICD-10-CM | POA: Insufficient documentation

## 2013-08-25 DIAGNOSIS — M545 Low back pain, unspecified: Secondary | ICD-10-CM | POA: Insufficient documentation

## 2013-08-25 DIAGNOSIS — B9689 Other specified bacterial agents as the cause of diseases classified elsewhere: Secondary | ICD-10-CM | POA: Insufficient documentation

## 2013-08-25 DIAGNOSIS — Z3202 Encounter for pregnancy test, result negative: Secondary | ICD-10-CM | POA: Insufficient documentation

## 2013-08-25 LAB — COMPREHENSIVE METABOLIC PANEL
ALT: 20 U/L (ref 0–35)
AST: 19 U/L (ref 0–37)
Albumin: 4.1 g/dL (ref 3.5–5.2)
Alkaline Phosphatase: 64 U/L (ref 39–117)
BUN: 9 mg/dL (ref 6–23)
CO2: 25 meq/L (ref 19–32)
Calcium: 9.3 mg/dL (ref 8.4–10.5)
Chloride: 103 mEq/L (ref 96–112)
Creatinine, Ser: 0.62 mg/dL (ref 0.50–1.10)
GFR calc Af Amer: >90 mL/min (ref >90)
Glucose, Bld: 93 mg/dL (ref 70–99)
Potassium: 3.8 mEq/L (ref 3.7–5.3)
SODIUM: 140 meq/L (ref 137–147)
TOTAL PROTEIN: 7.5 g/dL (ref 6.0–8.3)
Total Bilirubin: 0.5 mg/dL (ref 0.3–1.2)

## 2013-08-25 LAB — URINE MICROSCOPIC-ADD ON

## 2013-08-25 LAB — CBC WITH DIFFERENTIAL/PLATELET
BASOS ABS: 0.1 10*3/uL (ref 0.0–0.1)
Basophils Relative: 1 % (ref 0–1)
Eosinophils Absolute: 0.5 10*3/uL (ref 0.0–0.7)
Eosinophils Relative: 6 % — ABNORMAL HIGH (ref 0–5)
HCT: 40.3 % (ref 36.0–46.0)
Hemoglobin: 13.3 g/dL (ref 12.0–15.0)
LYMPHS ABS: 2 10*3/uL (ref 0.7–4.0)
LYMPHS PCT: 27 % (ref 12–46)
MCH: 29.3 pg (ref 26.0–34.0)
MCHC: 33 g/dL (ref 30.0–36.0)
MCV: 88.8 fL (ref 78.0–100.0)
Monocytes Absolute: 0.7 10*3/uL (ref 0.1–1.0)
Monocytes Relative: 9 % (ref 3–12)
NEUTROS PCT: 57 % (ref 43–77)
Neutro Abs: 4.3 10*3/uL (ref 1.7–7.7)
PLATELETS: 319 10*3/uL (ref 150–400)
RBC: 4.54 MIL/uL (ref 3.87–5.11)
RDW: 12.5 % (ref 11.5–15.5)
WBC: 7.6 10*3/uL (ref 4.0–10.5)

## 2013-08-25 LAB — URINALYSIS, ROUTINE W REFLEX MICROSCOPIC
Bilirubin Urine: NEGATIVE
Glucose, UA: NEGATIVE mg/dL
Hgb urine dipstick: NEGATIVE
Ketones, ur: NEGATIVE mg/dL
NITRITE: NEGATIVE
Protein, ur: NEGATIVE mg/dL
SPECIFIC GRAVITY, URINE: 1.028 (ref 1.005–1.030)
UROBILINOGEN UA: 0.2 mg/dL (ref 0.0–1.0)
pH: 7 (ref 5.0–8.0)

## 2013-08-25 LAB — PREGNANCY, URINE: PREG TEST UR: NEGATIVE

## 2013-08-25 LAB — WET PREP, GENITAL
Trich, Wet Prep: NONE SEEN
Yeast Wet Prep HPF POC: NONE SEEN

## 2013-08-25 MED ORDER — OXYCODONE-ACETAMINOPHEN 5-325 MG PO TABS
2.0000 | ORAL_TABLET | Freq: Once | ORAL | Status: AC
  Administered 2013-08-25: 2 via ORAL
  Filled 2013-08-25: qty 2

## 2013-08-25 MED ORDER — METRONIDAZOLE 500 MG PO TABS: 500.0000 mg | ORAL_TABLET | Freq: Two times a day (BID) | ORAL | Status: DC

## 2013-08-25 NOTE — ED Notes (Signed)
Pt reports that was seen here for back pain on 4 days ago. Pt states that back pain is no better and getting worse. Pt adds that meds that were rx'd are not working. Pt is A&O and in NAD

## 2013-08-25 NOTE — Discharge Instructions (Signed)
Take flagyl for bacterial vaginosis - do not drink alcohol with this medication it can cause vomiting  Follow-up with women's health and primary doctor  Return to the emergency department if you develop any changing/worsening condition, abdominal pain, loss of bowel/bladder function, weakness, blood in your urine/stool/vomit, repeated vomit, fever or any other concerns (please read additional information regarding your condition below)     Back Pain, Adult Low back pain is very common. About 1 in 5 people have back pain.The cause of low back pain is rarely dangerous. The pain often gets better over time.About half of people with a sudden onset of back pain feel better in just 2 weeks. About 8 in 10 people feel better by 6 weeks.  CAUSES Some common causes of back pain include:  Strain of the muscles or ligaments supporting the spine.  Wear and tear (degeneration) of the spinal discs.  Arthritis.  Direct injury to the back. DIAGNOSIS Most of the time, the direct cause of low back pain is not known.However, back pain can be treated effectively even when the exact cause of the pain is unknown.Answering your caregiver's questions about your overall health and symptoms is one of the most accurate ways to make sure the cause of your pain is not dangerous. If your caregiver needs more information, he or she may order lab work or imaging tests (X-rays or MRIs).However, even if imaging tests show changes in your back, this usually does not require surgery. HOME CARE INSTRUCTIONS For many people, back pain returns.Since low back pain is rarely dangerous, it is often a condition that people can learn to [facility] their own.   Remain active. It is stressful on the back to sit or stand in one place. Do not sit, drive, or stand in one place for more than 30 minutes at a time. Take short walks on level surfaces as soon as pain allows.Try to increase the length of time you walk each day.  Do not  stay in bed.Resting more than 1 or 2 days can delay your recovery.  Do not avoid exercise or work.Your body is made to move.It is not dangerous to be active, even though your back may hurt.Your back will likely heal faster if you return to being active before your pain is gone.  Pay attention to your body when you bend and lift. Many people have less discomfortwhen lifting if they bend their knees, keep the load close to their bodies,and avoid twisting. Often, the most comfortable positions are those that put less stress on your recovering back.  Find a comfortable position to sleep. Use a firm mattress and lie on your side with your knees slightly bent. If you lie on your back, put a pillow under your knees.  Only take over-the-counter or prescription medicines as directed by your caregiver. Over-the-counter medicines to reduce pain and inflammation are often the most helpful.Your caregiver may prescribe muscle relaxant drugs.These medicines help dull your pain so you can more quickly return to your normal activities and healthy exercise.  Put ice on the injured area.  Put ice in a plastic bag.  Place a towel between your skin and the bag.  Leave the ice on for 15-20 minutes, 03-04 times a day for the first 2 to 3 days. After that, ice and heat may be alternated to reduce pain and spasms.  Ask your caregiver about trying back exercises and gentle massage. This may be of some benefit.  Avoid feeling anxious or stressed.Stress increases  muscle tension and can worsen back pain.It is important to recognize when you are anxious or stressed and learn ways to manage it.Exercise is a great option. SEEK MEDICAL CARE IF:  You have pain that is not relieved with rest or medicine.  You have pain that does not improve in 1 week.  You have new symptoms.  You are generally not feeling well. SEEK IMMEDIATE MEDICAL CARE IF:   You have pain that radiates from your back into your  legs.  You develop new bowel or bladder control problems.  You have unusual weakness or numbness in your arms or legs.  You develop nausea or vomiting.  You develop abdominal pain.  You feel faint. Document Released: 06/13/2005 Document Revised: 12/13/2011 Document Reviewed: 11/01/2010 [facility] Patient Information 2014 [place], [place].  Abdominal Pain, Adult Many things can cause abdominal pain. Usually, abdominal pain is not caused by a disease and will improve without treatment. It can often be observed and treated at home. Your health care provider will do a physical exam and possibly order blood tests and X-rays to help determine the seriousness of your pain. However, in many cases, more time must pass before a clear cause of the pain can be found. Before that point, your health care provider may not know if you need more testing or further treatment. HOME CARE INSTRUCTIONS  Monitor your abdominal pain for any changes. The following actions may help to alleviate any discomfort you are experiencing:  Only take over-the-counter or prescription medicines as directed by your health care provider.  Do not take laxatives unless directed to do so by your health care provider.  Try a clear liquid diet (broth, tea, or water) as directed by your health care provider. Slowly move to a bland diet as tolerated. SEEK MEDICAL CARE IF:  You have unexplained abdominal pain.  You have abdominal pain associated with nausea or diarrhea.  You have pain when you urinate or have a bowel movement.  You experience abdominal pain that wakes you in the night.  You have abdominal pain that is worsened or improved by eating food.  You have abdominal pain that is worsened with eating fatty foods. SEEK IMMEDIATE MEDICAL CARE IF:   Your pain does not go away within 2 hours.  You have a fever.  You keep throwing up (vomiting).  Your pain is felt only in portions of the abdomen, such as the right side  or the left lower portion of the abdomen.  You pass bloody or black tarry stools. MAKE SURE YOU:  Understand these instructions.   Will watch your condition.   Will get help right away if you are not doing well or get worse.  Document Released: 03/23/2005 Document Revised: 04/03/2013 Document Reviewed: 02/20/2013 [facility] Patient Information 2014 [place].  Bacterial Vaginosis Bacterial vaginosis is a vaginal infection that occurs when the normal balance of bacteria in the vagina is disrupted. It results from an overgrowth of certain bacteria. This is the most common vaginal infection in women of childbearing age. Treatment is important to prevent complications, especially in pregnant women, as it can cause a premature delivery. CAUSES  Bacterial vaginosis is caused by an increase in harmful bacteria that are normally present in smaller amounts in the vagina. Several different kinds of bacteria can cause bacterial vaginosis. However, the reason that the condition develops is not fully understood. RISK FACTORS Certain activities or behaviors can put you at an increased risk of developing bacterial vaginosis, including:  Having  a new sex partner or multiple sex partners.  Douching.  Using an intrauterine device (IUD) for contraception. Women do not get bacterial vaginosis from toilet seats, bedding, swimming pools, or contact with objects around them. SIGNS AND SYMPTOMS  Some women with bacterial vaginosis have no signs or symptoms. Common symptoms include:  Grey vaginal discharge.  A fishlike odor with discharge, especially after sexual intercourse.  Itching or burning of the vagina and vulva.  Burning or pain with urination. DIAGNOSIS  Your health care provider will take a medical history and examine the vagina for signs of bacterial vaginosis. A sample of vaginal fluid may be taken. Your health care provider will look at this sample under a microscope to check for  bacteria and abnormal cells. A vaginal pH test may also be done.  TREATMENT  Bacterial vaginosis may be treated with antibiotic medicines. These may be given in the form of a pill or a vaginal cream. A second round of antibiotics may be prescribed if the condition comes back after treatment.  HOME CARE INSTRUCTIONS   Only take over-the-counter or prescription medicines as directed by your health care provider.  If antibiotic medicine was prescribed, take it as directed. Make sure you finish it even if you start to feel better.  Do not have sex until treatment is completed.  Tell all sexual partners that you have a vaginal infection. They should see their health care provider and be treated if they have problems, such as a mild rash or itching.  Practice safe sex by using condoms and only having one sex partner. SEEK MEDICAL CARE IF:   Your symptoms are not improving after 3 days of treatment.  You have increased discharge or pain.  You have a fever. MAKE SURE YOU:   Understand these instructions.  Will watch your condition.  Will get help right away if you are not doing well or get worse. FOR MORE INFORMATION  Centers for Disease Control and Prevention, Division of STD Prevention: AppraiserFraud.fi American Sexual Health Association (ASHA): www.ashastd.org  Document Released: 06/13/2005 Document Revised: 04/03/2013 Document Reviewed: 01/23/2013 [facility] Patient Information 2014 [place].   Emergency Department Resource Guide 1) Find a Doctor and Pay Out of Pocket Although you won't have to find out who is covered by your insurance plan, it is a good idea to ask around and get recommendations. You will then need to call the office and see if the doctor you have chosen will accept you as a new patient and what types of options they offer for patients who are self-pay. Some doctors offer discounts or will set up payment plans for their patients who do not have insurance, but  you will need to ask so you aren't surprised when you get to your appointment.  2) Contact Your Local Health Department Not all health departments have doctors that can see patients for sick visits, but many do, so it is worth a call to see if yours does. If you don't know where your local health department is, you can check in your phone book. The CDC also has a tool to help you locate your state's health department, and many state websites also have listings of all of their local health departments.  3) Find a [facility] If your illness is not likely to be very severe or complicated, you may want to try a walk in clinic. These are popping up all over the country in pharmacies, drugstores, and shopping centers. They're usually staffed by  nurse practitioners or physician assistants that have been trained to treat common illnesses and complaints. They're usually fairly quick and inexpensive. However, if you have serious medical issues or chronic medical problems, these are probably not your best option.  No Primary Care Doctor: - Call Health Connect at  [phone] - they can help you locate a primary care doctor that  accepts your insurance, provides certain services, etc. - Physician Referral Service- [phone]  Chronic Pain Problems: Organization         Address  Phone   Notes  [facility]  [phone] Patients need to be referred by their primary care doctor.   Medication Assistance: Organization         Address  Phone   Notes  [facility] Medication [facility] [address] [place], [place] [address] [phone] --Must be a resident of [facility] -- Must have NO insurance coverage whatsoever (no Medicaid/ Medicare, etc.) -- The pt. MUST have a primary care doctor that directs their care regularly and follows them in the community   MedAssist  [phone]   [employer]  [phone]    Agencies that provide inexpensive  medical care: Organization         Address  Phone   Notes  [place]  [phone]   [facility]    [phone]   [facility] [place], [place] [address] [phone]   [place] [address], [place] [phone]   Planned Parenthood    [phone]   [facility]    [phone]   [place] and [place] [address], [place] Phone:  [phone], Fax:  [phone] Hours of Operation:  9 am - 6 pm, M-F.  Also accepts Medicaid/Medicare and self-pay.  [facility] for [place], [address], [place] Phone: [phone], Fax: [phone]. Hours of Operation:  8:30 am - 5:30 pm, M-F.  Also accepts Medicaid and self-pay.  [facility] [place] [address] [place] Phone: [phone]   [place], [place], [place] [phone], Ext. [phone] Mondays & Thursdays: 7-9 AM.  First 15 patients are seen on a first come, first serve basis.    [place] Providers:  Organization         Address  Phone   Notes  [facility] [address],  [phone] Also accepts self-pay patients.  [facility] [address], [place]  [phone]   [place], [address], [place] [phone]   [facility] [address] [place] [phone]   Lucianne Lei [address], [place]   [phone] Only accepts [place] Access [place] patients after they have their name applied to their card.   Self-Pay (no insurance) in [facility]:  Organization         Address  Phone   Notes  Sickle Cell Patients, [facility] [place] [phone]   [facility] Urgent Care [place] [phone]   [facility] Urgent [place]  [place], [address], [place] [phone]   [facility]/Dr. Vista Lawman  [address], [place] or [place], [address], [place] [phone] Phone number for both [place] and [place] locations is the same.  Urgent Medical and [facility] [address], [place] [phone]   [facility] [address] [place] or [address] [phone]   [facility] [address], [place] [phone], phone; [phone], fax Sees patients 1st and 3rd Saturday of every month.  Must not qualify for public or private insurance (i.e. Medicaid, Medicare, [place] Health Choice, Veterans' Benefits)  Household income should be no more than 200% of the poverty level The clinic cannot treat you if you are pregnant or think you are pregnant  Sexually transmitted diseases are not treated at the clinic.    Dental Care: Organization         Address  Phone  Notes  [facility] Department of [facility] [place] [phone] Accepts children up to age 60 who are enrolled in [place] or [place]; pregnant women with a Medicaid card; and children who have applied for Medicaid or [place] Health Choice, but were declined, whose parents can pay a reduced fee at time of service.  [facility] Department of [facility]  [address], [place] [phone] Accepts children up to age 59 who are enrolled in [place] or [place]; pregnant women with a Medicaid card; and children who have applied for Medicaid or [place] Health Choice, but were declined, whose parents can pay a reduced fee at time of service.  [place] Adult Dental Access PROGRAM  [place] [phone] Patients are seen by appointment only. Walk-ins are not accepted.  [place] will see patients 67 years of age and older. Monday - Tuesday (8am-5pm) Most Wednesdays (8:30-5pm) $30 per visit, cash only  [facility] Adult Dental Access PROGRAM  [address], [facility] [phone] Patients are seen by appointment only. Walk-ins are not accepted. [place] will see patients 45 years of age and older. One Wednesday Evening (Monthly: Volunteer Based).  $30 per visit, cash only  [place]  [phone] for adults; Children under age 54, call Graduate Pediatric Dentistry at [phone]. Children aged 40-14, please call [phone] to request a pediatric application.  Dental services are provided in all areas of dental care including fillings, crowns and bridges, complete and partial dentures, implants, gum treatment, root canals, and extractions. Preventive care is also provided. Treatment is provided to both adults and children. Patients are selected via a lottery and there is often a waiting list.   [facility] [address] [place]  [phone] [email or web address]   [facility] [address] [place], [place] [phone], Ext. [phone] Second and Fourth Thursday of each month, opens at 6:30 AM; Clinic ends at 9 AM.  Patients are seen on a first-come first-served basis, and a limited number are seen during each clinic.   [facility]  [address] [place] Danker [place], [place] [phone]   Eligibility Requirements You must have lived in [place], [place], or [place] counties for at least the last three months.   You cannot be eligible for state or federal sponsored [employer], including [employer], [place], or [employer].   You generally cannot be eligible for healthcare insurance through your employer.  How to apply: Eligibility screenings are held every Tuesday and Wednesday afternoon from 1:00 pm until 4:00 pm. You do not need an appointment for the  interview!  [facility] [address], [place], [place]   [place]  [place] Department  [place]  [phone]    Behavioral Health Resources in the Community: Intensive Outpatient Programs Organization         Address  Phone  Notes  [place]. [address], [place], [place] [phone]   [facility] Outpatient [address], [place], [place]   ADS: Alcohol & Drug Svcs [address], [place], [place]   [place] [address]  [place], [place] or [phone]   Substance Abuse Resources Organization         Address  Phone  Notes  Alcohol and Drug Services  [phone]   [place]  [phone]   [facility] [place]   Chinita Pester  [phone]   Residential & Outpatient Substance Abuse Program  [phone]   Psychological Services Organization         Address  Phone  Notes  [facility] [place]  [place]  [phone]   [place] [address] [place] or [phone]    Mobile Crisis Teams Organization         Address  Phone  Notes  [facility], Mobile Crisis Care Unit  [phone]   [facility]  [address] [place], [place]   [place] [address], [place] [phone]    Self-Help/Support Groups Organization         Address  Phone             Notes  [place]. of [place] - variety of support groups  [place] Call for more information  Narcotics Anonymous (NA), [facility] [address], [employer] [place]  2 meetings at this location   Special educational needs teacher         Address  Phone  Notes  ASAP Residential Treatment  [place],    [place]  [phone]   [facility]  [address] [place] [phone], [place], [place]   [place], [place] [phone] Admissions: 8am-3pm M-F  Incentives Substance [place] [address],    [place], [place] [phone]   [facility] [address] [place], [place], [place]   [facility] [address]  [place], [place]   [facility] - Intensive Outpatient [address] [place], [place], [place]   [facility] ([place].) [place].,  [place], [place] [phone] or [phone]   [facility] Services (RTS) [address], [place], [place] Accepts Medicaid  [facility] [address]  [place] [phone] Substance Abuse/Addiction Treatment   [facility] Organization         Address  Phone  Notes  [facility]  [phone]   Domenic Schwab, PhD [address] [place], [place]   [phone] or [phone]   [place], [place] [phone]   [facility] [address], [place], [place] [phone] Insurance/Medicaid/sponsorship through [employer] and Families [address], Ste [place], [place] [phone] [place] [address]   [place], [place] [phone]    Dr. Adele Schilder  [phone]   [facility] Dept. 1) [address], [place] 2) [place] 3)  [place] [address], [place] [phone]  [phone]   [place] [phone] or [phone] (After Hours)

## 2013-08-25 NOTE — ED Provider Notes (Signed)
CSN: [id number]     Arrival date & time 08/25/13  1541 History   This chart was scribed for non-physician practitioner Mercy Moore, PA-C, working with Tanna Furry, MD, by Neta Ehlers, ED Scribe. This patient was seen in room [place] and the patient's care was started at 5:34 PM. First MD Initiated Contact with Patient 08/25/13 1708      Chief Complaint  Patient presents with  . Back Pain    The history is provided by the patient. No language interpreter was used.    HPI Comments: Christina Fox is a 33 y.o. female with a PMH of PID, kidney stones, GERD, and headaches who presents to the Emergency Department complaining of intermittent back pain, which began four days ago. She reports the current back pain is "crippling," and she rates the pain as 9/10. She states the pain is increased with sitting and bending. The pt was treated for similar unchanged back pain four days ago in the ED 08/20/13, and she reports the pain has not resolved with the prescribed medication (Valium, Ibuprofen, and Percocet) though the medication does offer temporary improvement. Patient states her pain is located in her lower back diffusely with occasional radiation towards her LLQ. She states she has episodes of back pain like this about every 3 months. She's been seen for previously and been told it was ovarian cysts or sciatica. No trauma. No dysuria, vaginal bleeding or discharge, numbness/paresthesia, weakness, loss of bowel/bladder function, diarrhea, constipation, fever, SOB, chest pain, or cough. She also denies new sexual partners or IV drug use. She states she experienced one episode of emesis after being given pain medication, which resolved.    Past Medical History  Diagnosis Date  . Headache(784.0)     sinus  . GERD (gastroesophageal reflux disease)   . Kidney stones     no current problem  . Anxiety   . Deviated septum 09/2011  . 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  .  Complication of anesthesia     states small mouth  . Pelvic inflammatory disease    Past Surgical History  Procedure Laterality Date  . Polyps removed      colon  . Wisdom tooth extraction    . Nasal septoplasty w/ turbinoplasty  10/04/2011    Procedure: NASAL SEPTOPLASTY WITH TURBINATE REDUCTION;  Surgeon: Ascencion Dike, MD;  Location: [facility];  Service: ENT;  Laterality: Bilateral;   Family History  Problem Relation Age of Onset  . Cancer Mother   . Hypertension Mother   . Hypertension Father   . Cancer Brother   . Hypertension Other    History  Substance Use Topics  . Smoking status: Former Smoker -- 0.25 packs/day    Types: Cigarettes  . Smokeless tobacco: Former Systems developer    Quit date: 10/02/2008  . Alcohol Use: No   OB History   Grav Para Term Preterm Abortions TAB SAB Ect Mult Living   4 4 4       4      Review of Systems  Constitutional: Negative for fever, chills, diaphoresis, activity change, appetite change and fatigue.  Respiratory: Negative for cough and shortness of breath.   Cardiovascular: Negative for chest pain.  Gastrointestinal: Positive for vomiting and abdominal pain. Negative for nausea, diarrhea and constipation.  Genitourinary: Negative for dysuria, hematuria, vaginal discharge, difficulty urinating and pelvic pain.  Musculoskeletal: Positive for back pain. Negative for arthralgias, myalgias and neck pain.  Neurological: Negative  for dizziness, weakness, numbness and headaches.  All other systems reviewed and are negative.   Allergies  Sulfa antibiotics  Home Medications   Current Outpatient Rx  Name  Route  Sig  Dispense  Refill  . clonazePAM (KLONOPIN) 1 MG tablet   Oral   Take 1 mg by mouth daily. AM         . diazepam (VALIUM) 5 MG tablet   Oral   Take 1 tablet (5 mg total) by mouth every 6 (six) hours as needed for muscle spasms.   10 tablet   0   . Etonogestrel (IMPLANON) 68 MG IMPL   Subcutaneous   Inject into the  skin once.          Marland Kitchen ibuprofen (ADVIL,MOTRIN) 600 MG tablet   Oral   Take 1 tablet (600 mg total) by mouth every 6 (six) hours as needed.   20 tablet   0   . oxyCODONE-acetaminophen (PERCOCET/ROXICET) 5-325 MG per tablet   Oral   Take 1-2 tablets by mouth every 6 (six) hours as needed for severe pain.   10 tablet   0   . vitamin C (ASCORBIC ACID) 500 MG tablet   Oral   Take 500 mg by mouth daily.          Triage Vitals: BP 130/82  Pulse 81  Temp(Src) 98.2 F (36.8 C)  Resp 16  SpO2 97%  Filed Vitals:   08/25/13 1609 08/25/13 1926  BP: 130/82 134/70  Pulse: 81 78  Temp: 98.2 F (36.8 C) 97.7 F (36.5 C)  TempSrc:  Oral  Resp: 16 19  SpO2: 97% 97%    Physical Exam  Nursing note and vitals reviewed. Constitutional: She is oriented to person, place, and time. She appears well-developed and well-nourished. No distress.  HENT:  Head: Normocephalic and atraumatic.  Right Ear: External ear normal.  Left Ear: External ear normal.  Nose: Nose normal.  Mouth/Throat: Oropharynx is clear and moist.  Eyes: Conjunctivae and EOM are normal. Right eye exhibits no discharge. Left eye exhibits no discharge.  Neck: Normal range of motion. Neck supple. No tracheal deviation present.  No cervical spinal or paraspinal tenderness to palpation throughout.  No limitations with neck ROM.    Cardiovascular: Normal rate, regular rhythm, normal heart sounds and intact distal pulses.  Exam reveals no gallop and no friction rub.   No murmur heard. Dorsalis pedis pulses present and equal bilaterally  Pulmonary/Chest: Effort normal and breath sounds normal. No respiratory distress. She has no wheezes. She has no rales. She exhibits no tenderness.  Abdominal: Soft. Bowel sounds are normal. She exhibits no distension and no mass. There is tenderness. There is no rebound and no guarding.  Mild LLQ tenderness.   Musculoskeletal: Normal range of motion. She exhibits tenderness. She exhibits no  edema.  Diffuse tenderness to palpation to the lower lumbar spine and paraspinal muscles bilaterally. No thoracic spinal tenderness. Strength 5/5 in the upper and lower extremities bilaterally. Mild increased pain with straight leg raise on the right. Patient able to ambulate without difficulty or ataxia. No LE edema or calf tenderness bilaterally.   Neurological: She is alert and oriented to person, place, and time.  Skin: Skin is warm and dry. She is not diaphoretic.  Psychiatric: She has a normal mood and affect. Her behavior is normal.    ED Course  Procedures (including critical care time)  DIAGNOSTIC STUDIES: Oxygen Saturation is 97% on room air, normal by my  interpretation.    COORDINATION OF CARE:  5:45 PM- Discussed treatment plan with patient, which includes pain medication and possibly a pelvic exam, and the patient agreed to the plan.   Labs Review Labs Reviewed - No data to display Imaging Review No results found.   EKG Interpretation None      Results for orders placed during the hospital encounter of 08/25/13  WET PREP, GENITAL      Result Value Ref Range   Yeast Wet Prep HPF POC NONE SEEN  NONE SEEN   Trich, Wet Prep NONE SEEN  NONE SEEN   Clue Cells Wet Prep HPF POC FEW (*) NONE SEEN   WBC, Wet Prep HPF POC MANY (*) NONE SEEN  GC/CHLAMYDIA PROBE AMP      Result Value Ref Range   CT Probe RNA NEGATIVE  NEGATIVE   GC Probe RNA NEGATIVE  NEGATIVE  URINALYSIS, ROUTINE W REFLEX MICROSCOPIC      Result Value Ref Range   Color, Urine YELLOW  YELLOW   APPearance CLOUDY (*) CLEAR   Specific Gravity, Urine 1.028  1.005 - 1.030   pH 7.0  5.0 - 8.0   Glucose, UA NEGATIVE  NEGATIVE mg/dL   Hgb urine dipstick NEGATIVE  NEGATIVE   Bilirubin Urine NEGATIVE  NEGATIVE   Ketones, ur NEGATIVE  NEGATIVE mg/dL   Protein, ur NEGATIVE  NEGATIVE mg/dL   Urobilinogen, UA 0.2  0.0 - 1.0 mg/dL   Nitrite NEGATIVE  NEGATIVE   Leukocytes, UA SMALL (*) NEGATIVE  PREGNANCY, URINE       Result Value Ref Range   Preg Test, Ur NEGATIVE  NEGATIVE  CBC WITH DIFFERENTIAL      Result Value Ref Range   WBC 7.6  4.0 - 10.5 K/uL   RBC 4.54  3.87 - 5.11 MIL/uL   Hemoglobin 13.3  12.0 - 15.0 g/dL   HCT 40.3  36.0 - 46.0 %   MCV 88.8  78.0 - 100.0 fL   MCH 29.3  26.0 - 34.0 pg   MCHC 33.0  30.0 - 36.0 g/dL   RDW 12.5  11.5 - 15.5 %   Platelets 319  150 - 400 K/uL   Neutrophils Relative % 57  43 - 77 %   Neutro Abs 4.3  1.7 - 7.7 K/uL   Lymphocytes Relative 27  12 - 46 %   Lymphs Abs 2.0  0.7 - 4.0 K/uL   Monocytes Relative 9  3 - 12 %   Monocytes Absolute 0.7  0.1 - 1.0 K/uL   Eosinophils Relative 6 (*) 0 - 5 %   Eosinophils Absolute 0.5  0.0 - 0.7 K/uL   Basophils Relative 1  0 - 1 %   Basophils Absolute 0.1  0.0 - 0.1 K/uL  COMPREHENSIVE METABOLIC PANEL      Result Value Ref Range   Sodium 140  137 - 147 mEq/L   Potassium 3.8  3.7 - 5.3 mEq/L   Chloride 103  96 - 112 mEq/L   CO2 25  19 - 32 mEq/L   Glucose, Bld 93  70 - 99 mg/dL   BUN 9  6 - 23 mg/dL   Creatinine, Ser 0.62  0.50 - 1.10 mg/dL   Calcium 9.3  8.4 - 10.5 mg/dL   Total Protein 7.5  6.0 - 8.3 g/dL   Albumin 4.1  3.5 - 5.2 g/dL   AST 19  0 - 37 U/L   ALT 20  0 -  35 U/L   Alkaline Phosphatase 64  39 - 117 U/L   Total Bilirubin 0.5  0.3 - 1.2 mg/dL   GFR calc non Af Amer >90  >90 mL/min   GFR calc Af Amer >90  >90 mL/min  URINE MICROSCOPIC-ADD ON      Result Value Ref Range   Squamous Epithelial / LPF FEW (*) RARE   WBC, UA 0-2  <3 WBC/hpf   RBC / HPF 0-2  <3 RBC/hpf   Bacteria, UA RARE  RARE   Urine-Other MUCOUS PRESENT       MDM   HARTLYN REIGEL is a 33 y.o. female with a PMH of PID, kidney stones, GERD, and headaches who presents to the Emergency Department complaining of intermittent back pain, which began four days ago  Rechecks  6:55 PM = Pain improving. Patient has a cheeseburger brought in by a friend because she was hungry. Asking to eat. Will keep NPO at this time until lab  results back. Repeat abdominal exam benign. Pelvic exam performed at bedside with ED tech. Minimal amount of thin white pelvic discharge present. No CMT or adnexal tenderness/masses. No vaginal bleeding.  7:30 PM = Patient ready for discharge. Discussed results with patient. Asking to eat.     Patient complaints of unchanged lower back pain which is recurring every few months. Patient seen in the ED 4 days ago for a similar complaint and returns for continued unchanged pain. Etiology of back pain likely musculoskeletal in nature. Pain worse with movement. No warning signs or symptoms to suggest cauda equina or epidural abscess. Patient also had intermittent LLQ abdominal pain. Abdominal exam benign. Labs and pelvic exam unremarkable. Will treat for BV. UA negative for UTI or hematuria. Kidney stones may also be a possible cause of her intermittent symptoms, but seem less likely today. Patient afebrile and non-toxic in appearance. Encouraged to follow-up with PCP and women's health. Return precautions, discharge instructions, and follow-up was discussed with the patient before discharge.    Discharge Medication List as of 08/25/2013  7:45 PM    START taking these medications   Details  metroNIDAZOLE (FLAGYL) 500 MG tablet Take 1 tablet (500 mg total) by mouth 2 (two) times daily., Starting 08/25/2013, Until Discontinued, Print        Final impressions: 1. Back pain   2. Bacterial vaginosis       Harold Hedge Mabel Roll PA-C          Lucila [place], [place] 08/27/13 1032

## 2013-08-26 LAB — GC/CHLAMYDIA PROBE AMP
CT Probe RNA: NEGATIVE
GC PROBE AMP APTIMA: NEGATIVE

## 2013-09-04 NOTE — ED Provider Notes (Signed)
Medical screening examination/treatment/procedure(s) were performed by non-physician practitioner and as supervising physician I was immediately available for consultation/collaboration.   EKG Interpretation None        Kensley Lares, MD 09/04/13 0650 

## 2014-01-01 ENCOUNTER — Encounter (HOSPITAL_BASED_OUTPATIENT_CLINIC_OR_DEPARTMENT_OTHER): Payer: Self-pay | Admitting: Emergency Medicine

## 2014-01-01 ENCOUNTER — Emergency Department (HOSPITAL_BASED_OUTPATIENT_CLINIC_OR_DEPARTMENT_OTHER)
Admission: EM | Admit: 2014-01-01 | Discharge: 2014-01-01 | Disposition: A | Discharge status: Home / Self Care | Payer: Medicaid Other | Attending: Emergency Medicine | Admitting: Emergency Medicine

## 2014-01-01 DIAGNOSIS — Z87891 Personal history of nicotine dependence: Secondary | ICD-10-CM | POA: Insufficient documentation

## 2014-01-01 DIAGNOSIS — Z8719 Personal history of other diseases of the digestive system: Secondary | ICD-10-CM | POA: Insufficient documentation

## 2014-01-01 DIAGNOSIS — Z791 Long term (current) use of non-steroidal anti-inflammatories (NSAID): Secondary | ICD-10-CM | POA: Insufficient documentation

## 2014-01-01 DIAGNOSIS — N390 Urinary tract infection, site not specified: Secondary | ICD-10-CM | POA: Insufficient documentation

## 2014-01-01 DIAGNOSIS — J029 Acute pharyngitis, unspecified: Secondary | ICD-10-CM | POA: Insufficient documentation

## 2014-01-01 DIAGNOSIS — Z3202 Encounter for pregnancy test, result negative: Secondary | ICD-10-CM | POA: Insufficient documentation

## 2014-01-01 DIAGNOSIS — Z79899 Other long term (current) drug therapy: Secondary | ICD-10-CM | POA: Insufficient documentation

## 2014-01-01 DIAGNOSIS — F411 Generalized anxiety disorder: Secondary | ICD-10-CM | POA: Insufficient documentation

## 2014-01-01 DIAGNOSIS — Z792 Long term (current) use of antibiotics: Secondary | ICD-10-CM | POA: Insufficient documentation

## 2014-01-01 LAB — URINALYSIS, ROUTINE W REFLEX MICROSCOPIC
Bilirubin Urine: NEGATIVE
GLUCOSE, UA: NEGATIVE mg/dL
Ketones, ur: NEGATIVE mg/dL
Nitrite: NEGATIVE
PH: 6 (ref 5.0–8.0)
Protein, ur: NEGATIVE mg/dL
SPECIFIC GRAVITY, URINE: 1.033 — AB (ref 1.005–1.030)
Urobilinogen, UA: 0.2 mg/dL (ref 0.0–1.0)

## 2014-01-01 LAB — URINE MICROSCOPIC-ADD ON

## 2014-01-01 LAB — RAPID STREP SCREEN (MED CTR MEBANE ONLY): STREPTOCOCCUS, GROUP A SCREEN (DIRECT): NEGATIVE

## 2014-01-01 LAB — PREGNANCY, URINE: Preg Test, Ur: NEGATIVE

## 2014-01-01 MED ORDER — CEPHALEXIN 500 MG PO CAPS: 500.0000 mg | ORAL_CAPSULE | Freq: Two times a day (BID) | ORAL | Status: DC

## 2014-01-01 NOTE — ED Provider Notes (Signed)
CSN: [id number]     Arrival date & time 01/01/14  1859 History   First MD Initiated Contact with Patient 01/01/14 1905     Chief Complaint  Patient presents with  . Flank Pain  . Sore Throat     (Consider location/radiation/quality/duration/timing/severity/associated sxs/prior Treatment) HPI Comments: Pt is c/o left flank pain that started yesterday. Urinary frequency. No fever, abdominal pain, vomiting, diarrhea. States that she is also having a sore throat. Daughter recently had strep. Hasn't taken anything for the symptoms. Denies numbness or weakness. No history of kidney stones  The history is provided by the patient. No language interpreter was used.    Past Medical History  Diagnosis Date  . Headache(784.0)     sinus  . GERD (gastroesophageal reflux disease)   . Kidney stones     no current problem  . Anxiety   . Deviated septum 09/2011  . 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  . Complication of anesthesia     states small mouth  . Pelvic inflammatory disease    Past Surgical History  Procedure Laterality Date  . Polyps removed      colon  . Wisdom tooth extraction    . Nasal septoplasty w/ turbinoplasty  10/04/2011    Procedure: NASAL SEPTOPLASTY WITH TURBINATE REDUCTION;  Surgeon: Ascencion Dike, MD;  Location: [facility];  Service: ENT;  Laterality: Bilateral;  . Tonsillectomy     Family History  Problem Relation Age of Onset  . Cancer Mother   . Hypertension Mother   . Hypertension Father   . Cancer Brother   . Hypertension Other    History  Substance Use Topics  . Smoking status: Former Smoker -- 0.25 packs/day  . Smokeless tobacco: Former Systems developer    Quit date: 10/02/2008  . Alcohol Use: No   OB History   Grav Para Term Preterm Abortions TAB SAB Ect Mult Living   4 4 4       4      Review of Systems  Constitutional: Negative.   HENT: Positive for sore throat.   Respiratory: Negative.   Cardiovascular: Negative.       Allergies   Sulfa antibiotics  Home Medications   Prior to Admission medications   Medication Sig Start Date End Date Taking? Authorizing Provider  clonazePAM (KLONOPIN) 1 MG tablet Take 1 mg by mouth daily. AM    Historical Provider, MD  diazepam (VALIUM) 5 MG tablet Take 1 tablet (5 mg total) by mouth every 6 (six) hours as needed for muscle spasms. 08/20/13   Jasper Riling. Toshia Larkin, MD  Etonogestrel (IMPLANON) 68 MG IMPL Inject into the skin once.     Historical Provider, MD  ibuprofen (ADVIL,MOTRIN) 600 MG tablet Take 1 tablet (600 mg total) by mouth every 6 (six) hours as needed. 08/20/13   Jasper Riling. Adarrius Graeff, MD  metroNIDAZOLE (FLAGYL) 500 MG tablet Take 1 tablet (500 mg total) by mouth 2 (two) times daily. 08/25/13   [place], PA-C  oxyCODONE-acetaminophen (PERCOCET/ROXICET) 5-325 MG per tablet Take 1-2 tablets by mouth every 6 (six) hours as needed for severe pain. 08/20/13   Jasper Riling. Laure Leone, MD  vitamin C (ASCORBIC ACID) 500 MG tablet Take 500 mg by mouth daily.    Historical Provider, MD   BP 138/86  Pulse 78  Temp(Src) 98.3 F (36.8 C) (Oral)  Resp 18  Ht 5\' 1"  (1.549 m)  Wt 189 lb (85.73 kg)  BMI 35.73 kg/m2  SpO2 96% Physical Exam  Nursing note and vitals reviewed. Constitutional: She is oriented to person, place, and time. She appears well-developed and well-nourished.  HENT:  Head: Normocephalic and atraumatic.  Cardiovascular: Normal rate and regular rhythm.   Pulmonary/Chest: Effort normal and breath sounds normal.  Abdominal: Soft. Bowel sounds are normal. There is no tenderness. There is no CVA tenderness.  Musculoskeletal: Normal range of motion.  Neurological: She is alert and oriented to person, place, and time. She exhibits normal muscle tone. Coordination normal.  Skin: Skin is dry.  Psychiatric: She has a normal mood and affect.    ED Course  Procedures (including critical care time) Labs Review Labs Reviewed  URINALYSIS, ROUTINE W REFLEX MICROSCOPIC -  Abnormal; Notable for the following:    APPearance CLOUDY (*)    Specific Gravity, Urine 1.033 (*)    Hgb urine dipstick MODERATE (*)    Leukocytes, UA SMALL (*)    All other components within normal limits  URINE MICROSCOPIC-ADD ON - Abnormal; Notable for the following:    Squamous Epithelial / LPF FEW (*)    Bacteria, UA FEW (*)    All other components within normal limits  RAPID STREP SCREEN  CULTURE, GROUP A STREP  URINE CULTURE  PREGNANCY, URINE    Imaging Review No results found.   EKG Interpretation None      MDM   Final diagnoses:  UTI (lower urinary tract infection)  Pharyngitis    Will treat for simple uti. Clinically not pyelo. Strep negative. Culture sent on both   Glendell Docker, NP 01/01/14 2009

## 2014-01-01 NOTE — ED Notes (Signed)
Left flank pain started yesterday-sore throat today- daughter in house being treated for strep

## 2014-01-01 NOTE — Discharge Instructions (Signed)
Pharyngitis Pharyngitis is a sore throat (pharynx). There is redness, pain, and swelling of your throat. HOME CARE   Drink enough fluids to keep your pee (urine) clear or pale yellow.  Only take medicine as told by your doctor.  You may get sick again if you do not take medicine as told. Finish your medicines, even if you start to feel better.  Do not take aspirin.  Rest.  Rinse your mouth (gargle) with salt water ( tsp of salt per 1 qt of water) every 1-2 hours. This will help the pain.  If you are not at risk for choking, you can suck on hard candy or sore throat lozenges. GET HELP IF:  You have large, tender lumps on your neck.  You have a rash.  You cough up green, yellow-brown, or bloody spit. GET HELP RIGHT AWAY IF:   You have a stiff neck.  You drool or cannot swallow liquids.  You throw up (vomit) or are not able to keep medicine or liquids down.  You have very bad pain that does not go away with medicine.  You have problems breathing (not from a stuffy nose). MAKE SURE YOU:   Understand these instructions.  Will watch your condition.  Will get help right away if you are not doing well or get worse. Document Released: 11/30/2007 Document Revised: 04/03/2013 Document Reviewed: 02/18/2013 [facility] Patient Information 2015 [place], [place]. This information is not intended to replace advice given to you by your health care provider. Make sure you discuss any questions you have with your health care provider.  Urinary Tract Infection Urinary tract infections (UTIs) can develop anywhere along your urinary tract. Your urinary tract is your body's drainage system for removing wastes and extra water. Your urinary tract includes two kidneys, two ureters, a bladder, and a urethra. Your kidneys are a pair of bean-shaped organs. Each kidney is about the size of your fist. They are located below your ribs, one on each side of your spine. CAUSES Infections are caused by  microbes, which are microscopic organisms, including fungi, viruses, and bacteria. These organisms are so small that they can only be seen through a microscope. Bacteria are the microbes that most commonly cause UTIs. SYMPTOMS  Symptoms of UTIs may vary by age and gender of the patient and by the location of the infection. Symptoms in young women typically include a frequent and intense urge to urinate and a painful, burning feeling in the bladder or urethra during urination. Older women and men are more likely to be tired, shaky, and weak and have muscle aches and abdominal pain. A fever may mean the infection is in your kidneys. Other symptoms of a kidney infection include pain in your back or sides below the ribs, nausea, and vomiting. DIAGNOSIS To diagnose a UTI, your caregiver will ask you about your symptoms. Your caregiver also will ask to provide a urine sample. The urine sample will be tested for bacteria and white blood cells. White blood cells are made by your body to help fight infection. TREATMENT  Typically, UTIs can be treated with medication. Because most UTIs are caused by a bacterial infection, they usually can be treated with the use of antibiotics. The choice of antibiotic and length of treatment depend on your symptoms and the type of bacteria causing your infection. HOME CARE INSTRUCTIONS  If you were prescribed antibiotics, take them exactly as your caregiver instructs you. Finish the medication even if you feel better after you have  only taken some of the medication.  Drink enough water and fluids to keep your urine clear or pale yellow.  Avoid caffeine, tea, and carbonated beverages. They tend to irritate your bladder.  Empty your bladder often. Avoid holding urine for long periods of time.  Empty your bladder before and after sexual intercourse.  After a bowel movement, women should cleanse from front to back. Use each tissue only once. SEEK MEDICAL CARE IF:   You have  back pain.  You develop a fever.  Your symptoms do not begin to resolve within 3 days. SEEK IMMEDIATE MEDICAL CARE IF:   You have severe back pain or lower abdominal pain.  You develop chills.  You have nausea or vomiting.  You have continued burning or discomfort with urination. MAKE SURE YOU:   Understand these instructions.  Will watch your condition.  Will get help right away if you are not doing well or get worse. Document Released: 03/23/2005 Document Revised: 12/13/2011 Document Reviewed: 07/22/2011 [facility] Patient Information 2015 [place], [place]. This information is not intended to replace advice given to you by your health care provider. Make sure you discuss any questions you have with your health care provider.

## 2014-01-02 ENCOUNTER — Telehealth (HOSPITAL_BASED_OUTPATIENT_CLINIC_OR_DEPARTMENT_OTHER): Payer: Self-pay

## 2014-01-02 NOTE — ED Provider Notes (Signed)
Medical screening examination/treatment/procedure(s) were performed by non-physician practitioner and as supervising physician I was immediately available for consultation/collaboration.   EKG Interpretation None        Houston Siren III, MD 01/02/14 0000

## 2014-01-03 LAB — CULTURE, GROUP A STREP

## 2014-02-03 ENCOUNTER — Encounter (HOSPITAL_BASED_OUTPATIENT_CLINIC_OR_DEPARTMENT_OTHER): Payer: Self-pay | Admitting: Emergency Medicine

## 2014-02-03 ENCOUNTER — Emergency Department (HOSPITAL_BASED_OUTPATIENT_CLINIC_OR_DEPARTMENT_OTHER)
Admission: EM | Admit: 2014-02-03 | Discharge: 2014-02-03 | Disposition: A | Discharge status: Home / Self Care | Payer: Medicaid Other | Attending: Emergency Medicine | Admitting: Emergency Medicine

## 2014-02-03 DIAGNOSIS — J029 Acute pharyngitis, unspecified: Secondary | ICD-10-CM | POA: Insufficient documentation

## 2014-02-03 DIAGNOSIS — N739 Female pelvic inflammatory disease, unspecified: Secondary | ICD-10-CM | POA: Insufficient documentation

## 2014-02-03 DIAGNOSIS — F411 Generalized anxiety disorder: Secondary | ICD-10-CM | POA: Insufficient documentation

## 2014-02-03 DIAGNOSIS — Z87891 Personal history of nicotine dependence: Secondary | ICD-10-CM | POA: Insufficient documentation

## 2014-02-03 DIAGNOSIS — Z792 Long term (current) use of antibiotics: Secondary | ICD-10-CM | POA: Insufficient documentation

## 2014-02-03 DIAGNOSIS — Z791 Long term (current) use of non-steroidal anti-inflammatories (NSAID): Secondary | ICD-10-CM | POA: Insufficient documentation

## 2014-02-03 DIAGNOSIS — Z8719 Personal history of other diseases of the digestive system: Secondary | ICD-10-CM | POA: Insufficient documentation

## 2014-02-03 DIAGNOSIS — Z9889 Other specified postprocedural states: Secondary | ICD-10-CM | POA: Insufficient documentation

## 2014-02-03 DIAGNOSIS — Z87442 Personal history of urinary calculi: Secondary | ICD-10-CM | POA: Insufficient documentation

## 2014-02-03 LAB — RAPID STREP SCREEN (MED CTR MEBANE ONLY): Streptococcus, Group A Screen (Direct): NEGATIVE

## 2014-02-03 MED ORDER — CLINDAMYCIN HCL 300 MG PO CAPS: 300.0000 mg | ORAL_CAPSULE | Freq: Four times a day (QID) | ORAL | Status: DC

## 2014-02-03 MED ORDER — CLINDAMYCIN HCL 150 MG PO CAPS
300.0000 mg | ORAL_CAPSULE | Freq: Once | ORAL | Status: AC
  Administered 2014-02-03: 300 mg via ORAL
  Filled 2014-02-03: qty 2

## 2014-02-03 MED ORDER — HYDROCODONE-ACETAMINOPHEN 5-325 MG PO TABS: 2.0000 | ORAL_TABLET | ORAL | Status: DC | PRN

## 2014-02-03 MED ORDER — HYDROCODONE-ACETAMINOPHEN 5-325 MG PO TABS
2.0000 | ORAL_TABLET | Freq: Once | ORAL | Status: AC
  Administered 2014-02-03: 2 via ORAL
  Filled 2014-02-03: qty 2

## 2014-02-03 NOTE — ED Provider Notes (Signed)
CSN: [id number]     Arrival date & time 02/03/14  1959 History   First MD Initiated Contact with Patient 02/03/14 2058     Chief Complaint  Patient presents with  . Sore Throat     (Consider location/radiation/quality/duration/timing/severity/associated sxs/prior Treatment) Patient is a 33 y.o. female presenting with pharyngitis. The history is provided by the patient. No language interpreter was used.  Sore Throat This is a new problem. The current episode started today. The problem occurs constantly. The problem has been gradually worsening. Associated symptoms include a sore throat. Nothing aggravates the symptoms. She has tried nothing for the symptoms. The treatment provided moderate relief.    Past Medical History  Diagnosis Date  . Headache(784.0)     sinus  . GERD (gastroesophageal reflux disease)   . Kidney stones     no current problem  . Anxiety   . Deviated septum 09/2011  . 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  . Complication of anesthesia     states small mouth  . Pelvic inflammatory disease    Past Surgical History  Procedure Laterality Date  . Polyps removed      colon  . Wisdom tooth extraction    . Nasal septoplasty w/ turbinoplasty  10/04/2011    Procedure: NASAL SEPTOPLASTY WITH TURBINATE REDUCTION;  Surgeon: Ascencion Dike, MD;  Location: [place];  Service: ENT;  Laterality: Bilateral;  . Tonsillectomy     Family History  Problem Relation Age of Onset  . Cancer Mother   . Hypertension Mother   . Hypertension Father   . Cancer Brother   . Hypertension Other    History  Substance Use Topics  . Smoking status: Former Smoker -- 0.25 packs/day  . Smokeless tobacco: Former Systems developer    Quit date: 10/02/2008  . Alcohol Use: No   OB History   Grav Para Term Preterm Abortions TAB SAB Ect Mult Living   4 4 4       4      Review of Systems  HENT: Positive for sore throat.   All other systems reviewed and are  negative.     Allergies  Sulfa antibiotics  Home Medications   Prior to Admission medications   Medication Sig Start Date End Date Taking? Authorizing Provider  cephALEXin (KEFLEX) 500 MG capsule Take 1 capsule (500 mg total) by mouth 2 (two) times daily. 01/01/14   Glendell Docker, NP  clonazePAM (KLONOPIN) 1 MG tablet Take 1 mg by mouth daily. AM    Historical Provider, MD  diazepam (VALIUM) 5 MG tablet Take 1 tablet (5 mg total) by mouth every 6 (six) hours as needed for muscle spasms. 08/20/13   Jasper Riling. Pickering, MD  Etonogestrel (IMPLANON) 68 MG IMPL Inject into the skin once.     Historical Provider, MD  ibuprofen (ADVIL,MOTRIN) 600 MG tablet Take 1 tablet (600 mg total) by mouth every 6 (six) hours as needed. 08/20/13   Jasper Riling. Pickering, MD  metroNIDAZOLE (FLAGYL) 500 MG tablet Take 1 tablet (500 mg total) by mouth 2 (two) times daily. 08/25/13   Lucila Maine, PA-C  oxyCODONE-acetaminophen (PERCOCET/ROXICET) 5-325 MG per tablet Take 1-2 tablets by mouth every 6 (six) hours as needed for severe pain. 08/20/13   Jasper Riling. Pickering, MD  vitamin C (ASCORBIC ACID) 500 MG tablet Take 500 mg by mouth daily.    Historical Provider, MD   BP 160/99  Pulse 83  Temp(Src) 98.8 F (37.1  C)  Resp 16  Ht 5\' 2"  (1.575 m)  Wt 198 lb (89.812 kg)  BMI 36.21 kg/m2  SpO2 98% Physical Exam  Nursing note and vitals reviewed. Constitutional: She is oriented to person, place, and time. She appears well-developed and well-nourished.  HENT:  Head: Normocephalic.  Right Ear: External ear normal.  Left Ear: External ear normal.  Erythema pharynx  Eyes: Conjunctivae and EOM are normal. Pupils are equal, round, and reactive to light.  Neck: Normal range of motion.  Cardiovascular: Normal rate and normal heart sounds.   Pulmonary/Chest: Effort normal.  Abdominal: She exhibits no distension.  Musculoskeletal: Normal range of motion.  Neurological: She is alert and oriented to person, place, and  time.  Skin: Skin is warm.  Psychiatric: She has a normal mood and affect.    ED Course Pt had a peritonsillar abscess  6 months ago.   Pt worried about reoccurrence.   Procedures (including critical care time) Labs Review Labs Reviewed  RAPID STREP SCREEN  CULTURE, GROUP A STREP    Imaging Review No results found.   EKG Interpretation None      MDM   Final diagnoses:  Pharyngitis    Clindamycin Hydrocodone Follow up with EN[facility] if symptoms persist    Fransico Meadow, PA-C 02/03/14 2200

## 2014-02-03 NOTE — Discharge Instructions (Signed)

## 2014-02-03 NOTE — ED Notes (Signed)
Pt c/o sore throat x 1 day  HX tonsil abscess in past

## 2014-02-03 NOTE — ED Provider Notes (Signed)
Medical screening examination/treatment/procedure(s) were performed by non-physician practitioner and as supervising physician I was immediately available for consultation/collaboration.   EKG Interpretation None        Arbie Cookey, MD 02/03/14 [phone]

## 2014-02-03 NOTE — ED Notes (Signed)
PA at bedside.

## 2014-02-03 NOTE — ED Notes (Signed)
Pt has driver present, Christina Fox, for transport home since meds were given.

## 2014-02-04 NOTE — Progress Notes (Addendum)
  CARE MANAGEMENT ED NOTE 02/04/2014  Patient:  Christina Fox, Christina Fox   Account Number:  [id number]  Date Initiated:  02/04/2014  Documentation initiated by:  Jackelyn Poling  Subjective/Objective Assessment:   33 yr old self pay [place] at [facility] on 02/03/14 for c/o sore throat x 1 day  HX tonsil abscess in past     Subjective/Objective Assessment Detail:   pcp Dr Jilda Panda  Pt stated in her voice message she was seen at [facility] Confirmed she does not have insurance coverage and discussed with EDP but was given cleocin "which I think she thought was on the four dollar list but it was not.  The cost at [facility] was thirty dollars and I can't afford it"  Pt requested Fox return call to [phone]     Action/Plan:   ED Cm received Fox call transferred to Cm office telephone at 1126 am 02/04/14. Cm reviewed EPIC preview of avs, d/c instructions, & chart review   Action/Plan Detail:   1855 CM left Fox voice message for pt Requesting Fox return call to Cm office number   Anticipated DC Date:  02/03/2014     Status Recommendation to Physician:   Result of Recommendation:    Other ED [place]  Other  PCP issues  Outpatient Services - Pt will follow up  Medication Assistance    Choice offered to / List presented to:            Status of service:  Completed, signed off  ED Comments:   ED Comments Detail:

## 2014-02-05 LAB — CULTURE, GROUP A STREP

## 2014-02-05 NOTE — Progress Notes (Signed)
  02/05/14 1329 ED Cm called pt to f/u after no return call or message received from pt CM  Dialed [phone] and left another voice message requesting a return call to CM office or mobile numbers or for her to return to [facility] to have Rx changed Pending return call from pt

## 2014-04-01 ENCOUNTER — Encounter (HOSPITAL_COMMUNITY): Payer: Self-pay | Admitting: Emergency Medicine

## 2014-04-01 ENCOUNTER — Emergency Department (HOSPITAL_COMMUNITY)
Admission: EM | Admit: 2014-04-01 | Discharge: 2014-04-01 | Disposition: A | Discharge status: Home / Self Care | Payer: Medicaid Other | Attending: Emergency Medicine | Admitting: Emergency Medicine

## 2014-04-01 DIAGNOSIS — F419 Anxiety disorder, unspecified: Secondary | ICD-10-CM | POA: Insufficient documentation

## 2014-04-01 DIAGNOSIS — Z79899 Other long term (current) drug therapy: Secondary | ICD-10-CM | POA: Insufficient documentation

## 2014-04-01 DIAGNOSIS — Z87442 Personal history of urinary calculi: Secondary | ICD-10-CM | POA: Insufficient documentation

## 2014-04-01 DIAGNOSIS — K625 Hemorrhage of anus and rectum: Secondary | ICD-10-CM | POA: Insufficient documentation

## 2014-04-01 DIAGNOSIS — Z8742 Personal history of other diseases of the female genital tract: Secondary | ICD-10-CM | POA: Insufficient documentation

## 2014-04-01 DIAGNOSIS — K219 Gastro-esophageal reflux disease without esophagitis: Secondary | ICD-10-CM | POA: Insufficient documentation

## 2014-04-01 DIAGNOSIS — Z8709 Personal history of other diseases of the respiratory system: Secondary | ICD-10-CM | POA: Insufficient documentation

## 2014-04-01 DIAGNOSIS — Z3202 Encounter for pregnancy test, result negative: Secondary | ICD-10-CM | POA: Insufficient documentation

## 2014-04-01 DIAGNOSIS — Z87891 Personal history of nicotine dependence: Secondary | ICD-10-CM | POA: Insufficient documentation

## 2014-04-01 DIAGNOSIS — R110 Nausea: Secondary | ICD-10-CM | POA: Insufficient documentation

## 2014-04-01 LAB — COMPREHENSIVE METABOLIC PANEL
ALK PHOS: 60 U/L (ref 39–117)
ALT: 15 U/L (ref 0–35)
AST: 16 U/L (ref 0–37)
Albumin: 3.7 g/dL (ref 3.5–5.2)
Anion gap: 12 (ref 5–15)
BUN: 10 mg/dL (ref 6–23)
CO2: 25 meq/L (ref 19–32)
Calcium: 9.1 mg/dL (ref 8.4–10.5)
Chloride: 99 mEq/L (ref 96–112)
Creatinine, Ser: 0.73 mg/dL (ref 0.50–1.10)
GFR calc Af Amer: >90 mL/min (ref >90)
Glucose, Bld: 99 mg/dL (ref 70–99)
POTASSIUM: 3.5 meq/L — AB (ref 3.7–5.3)
SODIUM: 136 meq/L — AB (ref 137–147)
Total Bilirubin: <0.2 mg/dL — ABNORMAL LOW (ref 0.3–1.2)
Total Protein: 7.3 g/dL (ref 6.0–8.3)

## 2014-04-01 LAB — CBC
HCT: 39.5 % (ref 36.0–46.0)
HEMOGLOBIN: 13.3 g/dL (ref 12.0–15.0)
MCH: 30.2 pg (ref 26.0–34.0)
MCHC: 33.7 g/dL (ref 30.0–36.0)
MCV: 89.8 fL (ref 78.0–100.0)
PLATELETS: 284 10*3/uL (ref 150–400)
RBC: 4.4 MIL/uL (ref 3.87–5.11)
RDW: 12.3 % (ref 11.5–15.5)
WBC: 7 10*3/uL (ref 4.0–10.5)

## 2014-04-01 LAB — URINALYSIS, ROUTINE W REFLEX MICROSCOPIC
BILIRUBIN URINE: NEGATIVE
GLUCOSE, UA: NEGATIVE mg/dL
HGB URINE DIPSTICK: NEGATIVE
Ketones, ur: NEGATIVE mg/dL
Nitrite: NEGATIVE
PROTEIN: NEGATIVE mg/dL
Specific Gravity, Urine: 1.022 (ref 1.005–1.030)
Urobilinogen, UA: 0.2 mg/dL (ref 0.0–1.0)
pH: 7.5 (ref 5.0–8.0)

## 2014-04-01 LAB — URINE MICROSCOPIC-ADD ON

## 2014-04-01 LAB — PREGNANCY, URINE: Preg Test, Ur: NEGATIVE

## 2014-04-01 LAB — POC OCCULT BLOOD, ED: Fecal Occult Bld: POSITIVE — AB

## 2014-04-01 MED ORDER — OMEPRAZOLE 20 MG PO CPDR: 20.0000 mg | DELAYED_RELEASE_CAPSULE | Freq: Every day | ORAL | Status: DC

## 2014-04-01 MED ORDER — ONDANSETRON HCL 4 MG PO TABS: 4.0000 mg | ORAL_TABLET | Freq: Three times a day (TID) | ORAL | Status: DC | PRN

## 2014-04-01 MED ORDER — ONDANSETRON 4 MG PO TBDP
4.0000 mg | ORAL_TABLET | Freq: Once | ORAL | Status: AC
  Administered 2014-04-01: 4 mg via ORAL
  Filled 2014-04-01: qty 1

## 2014-04-01 NOTE — Discharge Instructions (Signed)
Rectal Bleeding °Rectal bleeding is when blood passes out of the anus. It is usually a sign that something is wrong. It may not be serious, but it should always be evaluated. Rectal bleeding may present as bright red blood or extremely dark stools. The color may range from dark red or maroon to black (like tar). It is important that the cause of rectal bleeding be identified so treatment can be started and the problem corrected. °CAUSES  °· Hemorrhoids. These are enlarged (dilated) blood vessels or veins in the anal or rectal area. °· Fistulas. These are abnormal, burrowing channels that usually run from inside the rectum to the skin around the anus. They can bleed. °· Anal fissures. This is a tear in the tissue of the anus. Bleeding occurs with bowel movements. °· Diverticulosis. This is a condition in which pockets or sacs project from the bowel wall. Occasionally, the sacs can bleed. °· Diverticulitis. This is an infection involving diverticulosis of the colon. °· Proctitis and colitis. These are conditions in which the rectum, colon, or both, can become inflamed and pitted (ulcerated). °· Polyps and cancer. Polyps are non-cancerous (benign) growths in the colon that may bleed. Certain types of polyps turn into cancer. °· Protrusion of the rectum. Part of the rectum can project from the anus and bleed. °· Certain medicines. °· Intestinal infections. °· Blood vessel abnormalities. °HOME CARE INSTRUCTIONS °· Eat a high-fiber diet to keep your stool soft. °· Limit activity. °· Drink enough fluids to keep your urine clear or pale yellow. °· Warm baths may be useful to soothe rectal pain. °· Follow up with your caregiver as directed. °SEEK IMMEDIATE MEDICAL CARE IF: °· You develop increased bleeding. °· You have black or dark red stools. °· You vomit blood or material that looks like coffee grounds. °· You have abdominal pain or tenderness. °· You have a fever. °· You feel weak, nauseous, or you faint. °· You have  severe rectal pain or you are unable to have a bowel movement. °MAKE SURE YOU: °· Understand these instructions. °· Will watch your condition. °· Will get help right away if you are not doing well or get worse. °Document Released: 12/03/2001 Document Revised: 09/05/2011 Document Reviewed: 11/28/2010 °ExitCare® Patient Information ©2015 [employer]. This information is not intended to replace advice given to you by your health care provider. Make sure you discuss any questions you have with your health care provider. ° °

## 2014-04-01 NOTE — ED Notes (Signed)
Pt states bright red blood noted in stool today.  Site continued to bleed.  Pt has felt more weak than normal.  Pt states she has history of colon polyps

## 2014-04-01 NOTE — ED Provider Notes (Signed)
CSN: [id number]     Arrival date & time 04/01/14  1551 History   First MD Initiated Contact with Patient 04/01/14 1625     Chief Complaint  Patient presents with  . Rectal Bleeding     (Consider location/radiation/quality/duration/timing/severity/associated sxs/prior Treatment) Patient is a 33 y.o. female presenting with hematochezia.  Rectal Bleeding Associated symptoms: no abdominal pain, no dizziness, no fever, no light-headedness and no vomiting    Christina Fox is a 33 year old female past medical history of anxiety, GERD, who presents to the ER today with rectal bleeding. Patient states she has noticed bright red blood in her stool throughout the day today. Patient reports feeling mild nausea and malaise for approximately one week. Patient reports having a history of multiple bowel movements throughout the day, and states she has up to 3-4 bowel movements every day which is baseline for her. Patient denies diarrhea, and reports her bowel movements have been loose and formed. She reports some mucous in her stool, and also some dark colored stools. Patient denies any associated fever, abdominal pain, vomiting, dysuria, vaginal discharge, vaginal bleeding. Patient denies ever having rectal bleeding before today. Patient reports approximately 8 years ago she was diagnosed with colon polyps diagnosed with colonoscopy. Patient reports these polyps were asymptomatic at that time, and she had elective colonoscopy due to the fact that her grandfather was diagnosed with colon cancer.  Past Medical History  Diagnosis Date  . Headache(784.0)     sinus  . GERD (gastroesophageal reflux disease)   . Kidney stones     no current problem  . Anxiety   . Deviated septum 09/2011  . 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  . Complication of anesthesia     states small mouth  . Pelvic inflammatory disease    Past Surgical History  Procedure Laterality Date  . Polyps removed      colon  . Wisdom  tooth extraction    . Nasal septoplasty w/ turbinoplasty  10/04/2011    Procedure: NASAL SEPTOPLASTY WITH TURBINATE REDUCTION;  Surgeon: Ascencion Dike, MD;  Location: [place];  Service: ENT;  Laterality: Bilateral;  . Tonsillectomy     Family History  Problem Relation Age of Onset  . Cancer Mother   . Hypertension Mother   . Hypertension Father   . Cancer Brother   . Hypertension Other    History  Substance Use Topics  . Smoking status: Former Smoker -- 0.25 packs/day  . Smokeless tobacco: Former Systems developer    Quit date: 10/02/2008  . Alcohol Use: No   OB History   Grav Para Term Preterm Abortions TAB SAB Ect Mult Living   4 4 4       4      Review of Systems  Constitutional: Negative for fever.  HENT: Negative for trouble swallowing.   Eyes: Negative for visual disturbance.  Respiratory: Negative for shortness of breath.   Cardiovascular: Negative for chest pain.  Gastrointestinal: Positive for nausea, blood in stool, hematochezia and anal bleeding. Negative for vomiting, abdominal pain, diarrhea and constipation.  Genitourinary: Negative for dysuria.  Musculoskeletal: Negative for neck pain.  Skin: Negative for rash.  Neurological: Negative for dizziness, syncope, weakness, light-headedness and numbness.  Psychiatric/Behavioral: Negative.       Allergies  Sulfa antibiotics  Home Medications   Prior to Admission medications   Medication Sig Start Date End Date Taking? Authorizing Provider  clonazePAM (KLONOPIN) 1 MG tablet Take 0.5 mg by mouth  2 (two) times daily.    Yes Historical Provider, MD  ibuprofen (ADVIL,MOTRIN) 200 MG tablet Take 400 mg by mouth every 6 (six) hours as needed for headache or moderate pain.   Yes Historical Provider, MD  levonorgestrel-ethinyl estradiol (AUBRA) 0.1-20 MG-MCG tablet Take 1 tablet by mouth daily.   Yes Historical Provider, MD  omeprazole (PRILOSEC) 20 MG capsule Take 1 capsule (20 mg total) by mouth daily. 04/01/14    Carrie Mew, PA-C  ondansetron (ZOFRAN) 4 MG tablet Take 1 tablet (4 mg total) by mouth every 8 (eight) hours as needed for nausea or vomiting. 04/01/14   Carrie Mew, PA-C   BP 141/78  Pulse 63  Temp(Src) 98 F (36.7 C) (Oral)  Resp 15  SpO2 99% Physical Exam  Nursing note and vitals reviewed. Constitutional: She is oriented to person, place, and time. She appears well-developed and well-nourished. No distress.  HENT:  Head: Normocephalic and atraumatic.  Mouth/Throat: Oropharynx is clear and moist. No oropharyngeal exudate.  Eyes: Right eye exhibits no discharge. Left eye exhibits no discharge. No scleral icterus.  Neck: Normal range of motion.  Cardiovascular: Normal rate, regular rhythm and normal heart sounds.   No murmur heard. Pulmonary/Chest: Effort normal and breath sounds normal. No respiratory distress.  Abdominal: Soft. There is no tenderness.  Genitourinary: Rectal exam shows no external hemorrhoid, no internal hemorrhoid, no fissure, no mass and anal tone normal. Guaiac positive stool.  Mild discomfort on rectal exam. No obvious internal or external hemorrhoids. No lesions, rash, lacerations, trauma to the area. Chaperone present during entire rectal exam.  Musculoskeletal: Normal range of motion. She exhibits no edema and no tenderness.  Neurological: She is alert and oriented to person, place, and time. No cranial nerve deficit. Coordination normal.  Skin: Skin is warm and dry. No rash noted. She is not diaphoretic.  Psychiatric: She has a normal mood and affect.    ED Course  Procedures (including critical care time) Labs Review Labs Reviewed  COMPREHENSIVE METABOLIC PANEL - Abnormal; Notable for the following:    Sodium 136 (*)    Potassium 3.5 (*)    Total Bilirubin <0.2 (*)    All other components within normal limits  URINALYSIS, ROUTINE W REFLEX MICROSCOPIC - Abnormal; Notable for the following:    APPearance CLOUDY (*)    Leukocytes, UA SMALL (*)     All other components within normal limits  URINE MICROSCOPIC-ADD ON - Abnormal; Notable for the following:    Bacteria, UA MANY (*)    All other components within normal limits  POC OCCULT BLOOD, ED - Abnormal; Notable for the following:    Fecal Occult Bld POSITIVE (*)    All other components within normal limits  CBC  PREGNANCY, URINE    Imaging Review No results found.   EKG Interpretation None      MDM   Final diagnoses:  Rectal bleeding    Patient with one day of rectal bleeding. Blood is bright red in color, and is noted in stools in addition to formed, brown colored stool. Rectal exam shows no frank blood, no obvious melena with light brown colored stool, no obvious hemorrhoids. Patient denying any abdominal pain. Patient in mild nausea times one week with generalized malaise. History and exam non-concerning for acute abdomen. Workup unremarkable for any acute abnormality other than guaiac positive Hemoccult test. Patient's symptoms managed in the ER, patient nontoxic, nondistressed. Patient's mild nausea better after Zofran. Patient had a bowel movement  while in the ER, and reported that there was no blood noted with this bowel movement. Patient stating that she has been prescribed and PPI to help with the amount of bowel movements that she has in the past, which has helped. We'll discharge patient, sent home with PPI, and have her follow up with gastroenterology. Patient agreeable to this plan. I discussed strict return precautions with patient. I encouraged patient to call or return to ER should she have any questions or concerns.  BP 141/78  Pulse 63  Temp(Src) 98 F (36.7 C) (Oral)  Resp 15  SpO2 99%  Signed,  Dahlia Bailiff, PA-C 2:05 AM  This patient seen and discussed with Dr. Jola Schmidt, M.D.    Carrie Mew, PA-C 04/02/14 Greggory Keen

## 2014-04-02 NOTE — ED Provider Notes (Signed)
Medical screening examination/treatment/procedure(s) were performed by non-physician practitioner and as supervising physician I was immediately available for consultation/collaboration.   EKG Interpretation None        Hoy Morn, MD 04/02/14 1616

## 2014-04-28 ENCOUNTER — Encounter (HOSPITAL_COMMUNITY): Payer: Self-pay | Admitting: Emergency Medicine

## 2014-05-06 ENCOUNTER — Inpatient Hospital Stay (HOSPITAL_COMMUNITY)
Admission: AD | Admit: 2014-05-06 | Discharge: 2014-05-06 | Disposition: A | Discharge status: Home / Self Care | Payer: Medicaid Other | Source: Ambulatory Visit | Attending: Obstetrics & Gynecology | Admitting: Obstetrics & Gynecology

## 2014-05-06 ENCOUNTER — Encounter (HOSPITAL_COMMUNITY): Payer: Self-pay | Admitting: *Deleted

## 2014-05-06 ENCOUNTER — Inpatient Hospital Stay (HOSPITAL_COMMUNITY): Payer: Medicaid Other

## 2014-05-06 DIAGNOSIS — R1032 Left lower quadrant pain: Secondary | ICD-10-CM | POA: Insufficient documentation

## 2014-05-06 DIAGNOSIS — D72829 Elevated white blood cell count, unspecified: Secondary | ICD-10-CM | POA: Insufficient documentation

## 2014-05-06 LAB — COMPREHENSIVE METABOLIC PANEL
ALBUMIN: 3.6 g/dL (ref 3.5–5.2)
ALT: 15 U/L (ref 0–35)
AST: 13 U/L (ref 0–37)
Alkaline Phosphatase: 58 U/L (ref 39–117)
Anion gap: 10 (ref 5–15)
BUN: 9 mg/dL (ref 6–23)
CHLORIDE: 100 meq/L (ref 96–112)
CO2: 28 mEq/L (ref 19–32)
Calcium: 8.8 mg/dL (ref 8.4–10.5)
Creatinine, Ser: 0.67 mg/dL (ref 0.50–1.10)
GFR calc Af Amer: >90 mL/min (ref >90)
GFR calc non Af Amer: >90 mL/min (ref >90)
Glucose, Bld: 86 mg/dL (ref 70–99)
Potassium: 4 mEq/L (ref 3.7–5.3)
Sodium: 138 mEq/L (ref 137–147)
Total Bilirubin: 0.5 mg/dL (ref 0.3–1.2)
Total Protein: 6.9 g/dL (ref 6.0–8.3)

## 2014-05-06 LAB — URINE MICROSCOPIC-ADD ON

## 2014-05-06 LAB — URINALYSIS, ROUTINE W REFLEX MICROSCOPIC
Bilirubin Urine: NEGATIVE
Glucose, UA: NEGATIVE mg/dL
Ketones, ur: NEGATIVE mg/dL
NITRITE: NEGATIVE
PH: 7 (ref 5.0–8.0)
Protein, ur: NEGATIVE mg/dL
SPECIFIC GRAVITY, URINE: 1.025 (ref 1.005–1.030)
Urobilinogen, UA: 0.2 mg/dL (ref 0.0–1.0)

## 2014-05-06 LAB — CBC
HCT: 39.1 % (ref 36.0–46.0)
Hemoglobin: 13.3 g/dL (ref 12.0–15.0)
MCH: 30.6 pg (ref 26.0–34.0)
MCHC: 34 g/dL (ref 30.0–36.0)
MCV: 89.9 fL (ref 78.0–100.0)
Platelets: 268 10*3/uL (ref 150–400)
RBC: 4.35 MIL/uL (ref 3.87–5.11)
RDW: 12 % (ref 11.5–15.5)
WBC: 7.3 10*3/uL (ref 4.0–10.5)

## 2014-05-06 LAB — POCT PREGNANCY, URINE: Preg Test, Ur: NEGATIVE

## 2014-05-06 MED ORDER — IOHEXOL 300 MG/ML  SOLN
100.0000 mL | Freq: Once | INTRAMUSCULAR | Status: AC | PRN
  Administered 2014-05-06: 100 mL via INTRAVENOUS

## 2014-05-06 MED ORDER — IOHEXOL 300 MG/ML  SOLN
50.0000 mL | INTRAMUSCULAR | Status: AC
  Administered 2014-05-06: 50 mL via ORAL

## 2014-05-06 NOTE — MAU Note (Signed)
Patient states she has had left lower abdominal pain for a couple of days, worse today. States she takes BCP's and does not have periods but has had spotting off and on for the past 3 days. Denies nausea, vomiting or diarrhea.

## 2014-05-06 NOTE — Discharge Instructions (Signed)
Abdominal Pain, Women °Abdominal (stomach, pelvic, or belly) pain can be caused by many things. It is important to tell your doctor: °· The location of the pain. °· Does it come and go or is it present all the time? °· Are there things that start the pain (eating certain foods, exercise)? °· Are there other symptoms associated with the pain (fever, nausea, vomiting, diarrhea)? °All of this is helpful to know when trying to find the cause of the pain. °CAUSES  °· Stomach: virus or bacteria infection, or ulcer. °· Intestine: appendicitis (inflamed appendix), regional ileitis (Crohn's disease), ulcerative colitis (inflamed colon), irritable bowel syndrome, diverticulitis (inflamed diverticulum of the colon), or cancer of the stomach or intestine. °· Gallbladder disease or stones in the gallbladder. °· Kidney disease, kidney stones, or infection. °· Pancreas infection or cancer. °· Fibromyalgia (pain disorder). °· Diseases of the female organs: °¨ Uterus: fibroid (non-cancerous) tumors or infection. °¨ Fallopian tubes: infection or tubal pregnancy. °¨ Ovary: cysts or tumors. °¨ Pelvic adhesions (scar tissue). °¨ Endometriosis (uterus lining tissue growing in the pelvis and on the pelvic organs). °¨ Pelvic congestion syndrome (female organs filling up with blood just before the menstrual period). °¨ Pain with the menstrual period. °¨ Pain with ovulation (producing an egg). °¨ Pain with an IUD (intrauterine device, birth control) in the uterus. °¨ Cancer of the female organs. °· Functional pain (pain not caused by a disease, may improve without treatment). °· Psychological pain. °· Depression. °DIAGNOSIS  °Your doctor will decide the seriousness of your pain by doing an examination. °· Blood tests. °· X-rays. °· Ultrasound. °· CT scan (computed tomography, special type of X-ray). °· MRI (magnetic resonance imaging). °· Cultures, for infection. °· Barium enema (dye inserted in the large intestine, to better view it with  X-rays). °· Colonoscopy (looking in intestine with a lighted tube). °· Laparoscopy (minor surgery, looking in abdomen with a lighted tube). °· Major abdominal exploratory surgery (looking in abdomen with a large incision). °TREATMENT  °The treatment will depend on the cause of the pain.  °· Many cases can be observed and treated at home. °· Over-the-counter medicines recommended by your caregiver. °· Prescription medicine. °· Antibiotics, for infection. °· Birth control pills, for painful periods or for ovulation pain. °· Hormone treatment, for endometriosis. °· Nerve blocking injections. °· Physical therapy. °· Antidepressants. °· Counseling with a psychologist or psychiatrist. °· Minor or major surgery. °HOME CARE INSTRUCTIONS  °· Do not take laxatives, unless directed by your caregiver. °· Take over-the-counter pain medicine only if ordered by your caregiver. Do not take aspirin because it can cause an upset stomach or bleeding. °· Try a clear liquid diet (broth or water) as ordered by your caregiver. Slowly move to a bland diet, as tolerated, if the pain is related to the stomach or intestine. °· Have a thermometer and take your temperature several times a day, and record it. °· Bed rest and sleep, if it helps the pain. °· Avoid sexual intercourse, if it causes pain. °· Avoid stressful situations. °· Keep your follow-up appointments and tests, as your caregiver orders. °· If the pain does not go away with medicine or surgery, you may try: °¨ Acupuncture. °¨ Relaxation exercises (yoga, meditation). °¨ Group therapy. °¨ Counseling. °SEEK MEDICAL CARE IF:  °· You notice certain foods cause stomach pain. °· Your home care treatment is not helping your pain. °· You need stronger pain medicine. °· You want your IUD removed. °· You feel faint or   lightheaded. °· You develop nausea and vomiting. °· You develop a rash. °· You are having side effects or an allergy to your medicine. °SEEK IMMEDIATE MEDICAL CARE IF:  °· Your  pain does not go away or gets worse. °· You have a fever. °· Your pain is felt only in portions of the abdomen. The right side could possibly be appendicitis. The left lower portion of the abdomen could be colitis or diverticulitis. °· You are passing blood in your stools (bright red or black tarry stools, with or without vomiting). °· You have blood in your urine. °· You develop chills, with or without a fever. °· You pass out. °MAKE SURE YOU:  °· Understand these instructions. °· Will watch your condition. °· Will get help right away if you are not doing well or get worse. °Document Released: 04/10/2007 Document Revised: 10/28/2013 Document Reviewed: 04/30/2009 °ExitCare® Patient Information ©2015 [employer]. This information is not intended to replace advice given to you by your health care provider. Make sure you discuss any questions you have with your health care provider. ° °

## 2014-05-06 NOTE — MAU Note (Signed)
Paged radiology.

## 2014-05-06 NOTE — MAU Note (Signed)
Radiology in room with contrast for patient.

## 2014-05-06 NOTE — MAU Provider Note (Signed)
History     CSN: [id number]  Arrival date and time: 05/06/14 1840   None     Chief Complaint  Patient presents with  . Abdominal Pain  . Vaginal Discharge   HPI  LLQ pain all day, 9/10 worsening.  Pain worse when stands or sits.  No history of pain previously.   - No associated nausea, vomiting, no fever. - hx of PID in 1999 - although reported no hx of pain previously reported pain started 2 weeks ago, progressively worsening. - seen at [facility] last month for rectal bleeding, referred to GI, no imaging done.  Menses: currently on aubra, does not menstruate regularly on them  Past Medical History  Diagnosis Date  . Headache(784.0)     sinus  . GERD (gastroesophageal reflux disease)   . Kidney stones     no current problem  . Anxiety   . Deviated septum 09/2011  . 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  . Complication of anesthesia     states small mouth  . Pelvic inflammatory disease     Past Surgical History  Procedure Laterality Date  . Polyps removed      colon  . Wisdom tooth extraction    . Nasal septoplasty w/ turbinoplasty  10/04/2011    Procedure: NASAL SEPTOPLASTY WITH TURBINATE REDUCTION;  Surgeon: Ascencion Dike, MD;  Location: [facility];  Service: ENT;  Laterality: Bilateral;  . Tonsillectomy      Family History  Problem Relation Age of Onset  . Cancer Mother   . Hypertension Mother   . Hypertension Father   . Cancer Brother   . Hypertension Other     History  Substance Use Topics  . Smoking status: Former Smoker -- 0.25 packs/day  . Smokeless tobacco: Never Used  . Alcohol Use: No    Allergies:  Allergies  Allergen Reactions  . Sulfa Antibiotics Hives and Rash    Prescriptions prior to admission  Medication Sig Dispense Refill Last Dose  . clonazePAM (KLONOPIN) 1 MG tablet Take 1 mg by mouth daily.    05/06/2014 at Unknown time  . ibuprofen (ADVIL,MOTRIN) 600 MG tablet Take 600 mg by mouth every 8 (eight)  hours as needed for moderate pain.   05/06/2014 at Unknown time  . levonorgestrel-ethinyl estradiol (AUBRA) 0.1-20 MG-MCG tablet Take 1 tablet by mouth daily.   05/06/2014 at Unknown time  . omeprazole (PRILOSEC) 20 MG capsule Take 1 capsule (20 mg total) by mouth daily. 30 capsule 0 05/06/2014 at Unknown time  . ibuprofen (ADVIL,MOTRIN) 200 MG tablet Take 400 mg by mouth every 6 (six) hours as needed for headache or moderate pain.   Past Week at Unknown time  . ondansetron (ZOFRAN) 4 MG tablet Take 1 tablet (4 mg total) by mouth every 8 (eight) hours as needed for nausea or vomiting. (Patient not taking: Reported on 05/06/2014) 12 tablet 0     Review of Systems  Constitutional: Negative for fever and chills.  Respiratory: Negative for cough and shortness of breath.   Cardiovascular: Negative for chest pain and leg swelling.  Gastrointestinal: Negative for heartburn, nausea, vomiting and diarrhea.  Genitourinary: Negative for dysuria, urgency, frequency and hematuria.       Denies pelvic pain  Neurological:       No headache   Physical Exam   Blood pressure 148/93, pulse 84, resp. rate 16, height 5' 2.5" (1.588 m), weight 195 lb 12.8 oz (88.814 kg), SpO2 99 %.  Physical Exam  Constitutional: She is oriented to person, place, and time. She appears well-developed and well-nourished.  HENT:  Head: Normocephalic and atraumatic.  Eyes: Conjunctivae and EOM are normal.  Neck: Normal range of motion.  Cardiovascular: Normal rate.   Respiratory: Effort normal. No respiratory distress.  GI: Soft. She exhibits no distension. There is no tenderness.  Musculoskeletal: Normal range of motion. She exhibits no edema.  Neurological: She is alert and oriented to person, place, and time.  Skin: Skin is warm and dry. No erythema.   Results for BOBETTE, LEYH (MRN [id number]) as of 05/13/2014 21:24  Ref. Range 05/06/2014 20:20  Sodium Latest Range: 137-147 mEq/L 138  Potassium Latest Range:  3.7-5.3 mEq/L 4.0  Chloride Latest Range: 96-112 mEq/L 100  CO2 Latest Range: 19-32 mEq/L 28  BUN Latest Range: 6-23 mg/dL 9  Creatinine Latest Range: 0.50-1.10 mg/dL 0.67  Calcium Latest Range: 8.4-10.5 mg/dL 8.8  GFR calc non Af Amer Latest Range: >90 mL/min >90  GFR calc Af Amer Latest Range: >90 mL/min >90  Glucose Latest Range: 70-99 mg/dL 86  Anion gap Latest Range: 5-15  10  Alkaline Phosphatase Latest Range: 39-117 U/L 58  Albumin Latest Range: 3.5-5.2 g/dL 3.6  AST Latest Range: 0-37 U/L 13  ALT Latest Range: 0-35 U/L 15  Total Protein Latest Range: 6.0-8.3 g/dL 6.9  Total Bilirubin Latest Range: 0.3-1.2 mg/dL 0.5  WBC Latest Range: 4.0-10.5 K/uL 7.3  RBC Latest Range: 3.87-5.11 MIL/uL 4.35  Hemoglobin Latest Range: 12.0-15.0 g/dL 13.3  HCT Latest Range: 36.0-46.0 % 39.1  MCV Latest Range: 78.0-100.0 fL 89.9  MCH Latest Range: 26.0-34.0 pg 30.6  MCHC Latest Range: 30.0-36.0 g/dL 34.0  RDW Latest Range: 11.5-15.5 % 12.0  Platelets Latest Range: 150-400 K/uL 268   CONTRAST: 169mL OMNIPAQUE IOHEXOL 300 MG/ML SOLN  COMPARISON: 07/21/2010  FINDINGS: Lung bases are within normal.  Abdominal images demonstrate a normal liver, spleen, pancreas, gallbladder and adrenal glands. Kidneys are normal in size without hydronephrosis, focal mass or nephrolithiasis. Ureters are within normal. The appendix is normal. Vascular structures are within normal.  Pelvic images demonstrate the uterus, ovaries, bladder and rectum mid to be within normal. Remaining bony structures are unremarkable.  IMPRESSION: No acute findings in the abdomen/pelvis.  MAU Course  Procedures  MDM CBC, CMP, urine g/c, CT abd/pelvis with contrast  Assessment and Plan  Patient is 33 y.o. [id number] reporting abdominal pain likely of unknown etiology - life threatening causes of abdominal pain ruled out, given patient's history of polyps, family hx of colon cancer patient is strongly advised to  follow up with GI as was previously advised by Elv[facility] Sidle, pt has referral. - no narcotics given, pt did not request any pain medicine and although somewhat uncomfortable with exam she did not appear overly uncomfortable.   Tereka Thorley ROCIO 05/06/2014, 8:09 PM

## 2014-05-07 LAB — GC/CHLAMYDIA PROBE AMP
CT PROBE, AMP APTIMA: NEGATIVE
GC Probe RNA: NEGATIVE

## 2014-05-08 ENCOUNTER — Encounter (HOSPITAL_COMMUNITY): Payer: Self-pay

## 2014-05-08 ENCOUNTER — Emergency Department (HOSPITAL_COMMUNITY)
Admission: EM | Admit: 2014-05-08 | Discharge: 2014-05-08 | Disposition: A | Discharge status: Home / Self Care | Payer: Medicaid Other | Attending: Emergency Medicine | Admitting: Emergency Medicine

## 2014-05-08 DIAGNOSIS — N390 Urinary tract infection, site not specified: Secondary | ICD-10-CM

## 2014-05-08 DIAGNOSIS — F419 Anxiety disorder, unspecified: Secondary | ICD-10-CM | POA: Insufficient documentation

## 2014-05-08 DIAGNOSIS — Z87891 Personal history of nicotine dependence: Secondary | ICD-10-CM | POA: Insufficient documentation

## 2014-05-08 DIAGNOSIS — Z3202 Encounter for pregnancy test, result negative: Secondary | ICD-10-CM | POA: Insufficient documentation

## 2014-05-08 DIAGNOSIS — Z8742 Personal history of other diseases of the female genital tract: Secondary | ICD-10-CM | POA: Insufficient documentation

## 2014-05-08 DIAGNOSIS — K219 Gastro-esophageal reflux disease without esophagitis: Secondary | ICD-10-CM | POA: Insufficient documentation

## 2014-05-08 DIAGNOSIS — Z87442 Personal history of urinary calculi: Secondary | ICD-10-CM | POA: Insufficient documentation

## 2014-05-08 DIAGNOSIS — Z79899 Other long term (current) drug therapy: Secondary | ICD-10-CM | POA: Insufficient documentation

## 2014-05-08 LAB — CBC WITH DIFFERENTIAL/PLATELET
BASOS ABS: 0.1 10*3/uL (ref 0.0–0.1)
BASOS PCT: 1 % (ref 0–1)
EOS ABS: 0.4 10*3/uL (ref 0.0–0.7)
Eosinophils Relative: 4 % (ref 0–5)
HCT: 40.3 % (ref 36.0–46.0)
Hemoglobin: 13.3 g/dL (ref 12.0–15.0)
LYMPHS ABS: 1.9 10*3/uL (ref 0.7–4.0)
Lymphocytes Relative: 24 % (ref 12–46)
MCH: 29.6 pg (ref 26.0–34.0)
MCHC: 33 g/dL (ref 30.0–36.0)
MCV: 89.8 fL (ref 78.0–100.0)
Monocytes Absolute: 0.7 10*3/uL (ref 0.1–1.0)
Monocytes Relative: 8 % (ref 3–12)
NEUTROS PCT: 63 % (ref 43–77)
Neutro Abs: 5 10*3/uL (ref 1.7–7.7)
Platelets: 314 10*3/uL (ref 150–400)
RBC: 4.49 MIL/uL (ref 3.87–5.11)
RDW: 12 % (ref 11.5–15.5)
WBC: 8 10*3/uL (ref 4.0–10.5)

## 2014-05-08 LAB — COMPREHENSIVE METABOLIC PANEL
ALBUMIN: 3.8 g/dL (ref 3.5–5.2)
ALT: 16 U/L (ref 0–35)
AST: 15 U/L (ref 0–37)
Alkaline Phosphatase: 58 U/L (ref 39–117)
Anion gap: 11 (ref 5–15)
BUN: 10 mg/dL (ref 6–23)
CALCIUM: 9.7 mg/dL (ref 8.4–10.5)
CO2: 26 mEq/L (ref 19–32)
Chloride: 103 mEq/L (ref 96–112)
Creatinine, Ser: 0.72 mg/dL (ref 0.50–1.10)
GFR calc Af Amer: >90 mL/min (ref >90)
GFR calc non Af Amer: >90 mL/min (ref >90)
Glucose, Bld: 98 mg/dL (ref 70–99)
POTASSIUM: 3.8 meq/L (ref 3.7–5.3)
SODIUM: 140 meq/L (ref 137–147)
TOTAL PROTEIN: 7.7 g/dL (ref 6.0–8.3)
Total Bilirubin: 0.4 mg/dL (ref 0.3–1.2)

## 2014-05-08 LAB — URINE MICROSCOPIC-ADD ON

## 2014-05-08 LAB — URINALYSIS, ROUTINE W REFLEX MICROSCOPIC
BILIRUBIN URINE: NEGATIVE
Glucose, UA: NEGATIVE mg/dL
Ketones, ur: NEGATIVE mg/dL
NITRITE: NEGATIVE
PH: 6.5 (ref 5.0–8.0)
Protein, ur: NEGATIVE mg/dL
Specific Gravity, Urine: 1.015 (ref 1.005–1.030)
Urobilinogen, UA: 0.2 mg/dL (ref 0.0–1.0)

## 2014-05-08 LAB — POC URINE PREG, ED: Preg Test, Ur: NEGATIVE

## 2014-05-08 MED ORDER — HYDROCODONE-ACETAMINOPHEN 5-325 MG PO TABS: 2.0000 | ORAL_TABLET | ORAL | Status: DC | PRN

## 2014-05-08 MED ORDER — HYDROCODONE-ACETAMINOPHEN 5-325 MG PO TABS
1.0000 | ORAL_TABLET | Freq: Once | ORAL | Status: AC
  Administered 2014-05-08: 1 via ORAL
  Filled 2014-05-08: qty 1

## 2014-05-08 MED ORDER — CIPROFLOXACIN HCL 500 MG PO TABS: 500.0000 mg | ORAL_TABLET | Freq: Two times a day (BID) | ORAL | Status: DC

## 2014-05-08 MED ORDER — CIPROFLOXACIN HCL 500 MG PO TABS
500.0000 mg | ORAL_TABLET | Freq: Once | ORAL | Status: AC
  Administered 2014-05-08: 500 mg via ORAL
  Filled 2014-05-08: qty 1

## 2014-05-08 NOTE — ED Notes (Signed)
Pt states abdominal pain since Tuesday. Had CT scan on Tuesday which was negative.  Pt continues with left lower abdominal pain and pain in left back. No change in urination or bowel.  No fever.

## 2014-05-08 NOTE — Discharge Instructions (Signed)

## 2014-05-08 NOTE — ED Provider Notes (Signed)
CSN: [id number]     Arrival date & time 05/08/14  1739 History   First MD Initiated Contact with Patient 05/08/14 2016     Chief Complaint  Patient presents with  . Abdominal Pain  . Flank Pain     HPI  [facility] evaluation of left lower abdomen, and left flank pain. Symptoms hospital had extensive evaluation. Had some cells in her urine. But had a normal CT scan. No ovarian or uterine pathology, no hydronephrosis. Describes constant pain. Some dysuria. No frequency. No fevers chills nausea vomiting diarrhea.  Past Medical History  Diagnosis Date  . Headache(784.0)     sinus  . GERD (gastroesophageal reflux disease)   . Kidney stones     no current problem  . Anxiety   . Deviated septum 09/2011  . 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  . Complication of anesthesia     states small mouth  . Pelvic inflammatory disease    Past Surgical History  Procedure Laterality Date  . Polyps removed      colon  . Wisdom tooth extraction    . Nasal septoplasty w/ turbinoplasty  10/04/2011    Procedure: NASAL SEPTOPLASTY WITH TURBINATE REDUCTION;  Surgeon: Ascencion Dike, MD;  Location: [facility];  Service: ENT;  Laterality: Bilateral;  . Tonsillectomy     Family History  Problem Relation Age of Onset  . Cancer Mother   . Hypertension Mother   . Hypertension Father   . Cancer Brother   . Hypertension Other    History  Substance Use Topics  . Smoking status: Former Smoker -- 0.25 packs/day  . Smokeless tobacco: Never Used  . Alcohol Use: No   OB History    Gravida Para Term Preterm AB TAB SAB Ectopic Multiple Living   4 4 4       4      Review of Systems  Constitutional: Negative for fever, chills, diaphoresis, appetite change and fatigue.  HENT: Negative for mouth sores, sore throat and trouble swallowing.   Eyes: Negative for visual disturbance.  Respiratory: Negative for cough, chest tightness, shortness of breath and wheezing.   Cardiovascular: Negative  for chest pain.  Gastrointestinal: Positive for abdominal pain. Negative for nausea, vomiting, diarrhea and abdominal distention.  Endocrine: Negative for polydipsia, polyphagia and polyuria.  Genitourinary: Negative for dysuria, frequency and hematuria.  Musculoskeletal: Negative for gait problem.  Skin: Negative for color change, pallor and rash.  Neurological: Negative for dizziness, syncope, light-headedness and headaches.  Hematological: Does not bruise/bleed easily.  Psychiatric/Behavioral: Negative for behavioral problems and confusion.      Allergies  Sulfa antibiotics  Home Medications   Prior to Admission medications   Medication Sig Start Date End Date Taking? Authorizing Provider  clonazePAM (KLONOPIN) 1 MG tablet Take 1 mg by mouth daily.    Yes Historical Provider, MD  ibuprofen (ADVIL,MOTRIN) 600 MG tablet Take 600 mg by mouth every 8 (eight) hours as needed for moderate pain (pain).    Yes Historical Provider, MD  levonorgestrel-ethinyl estradiol (AUBRA) 0.1-20 MG-MCG tablet Take 1 tablet by mouth daily.   Yes Historical Provider, MD  omeprazole (PRILOSEC) 20 MG capsule Take 1 capsule (20 mg total) by mouth daily. 04/01/14  Yes Carrie Mew, PA-C  ciprofloxacin (CIPRO) 500 MG tablet Take 1 tablet (500 mg total) by mouth every 12 (twelve) hours. 05/08/14   Tanna Furry, MD  HYDROcodone-acetaminophen (NORCO/VICODIN) 5-325 MG per tablet Take 2 tablets by mouth  every 4 (four) hours as needed. 05/08/14   Tanna Furry, MD  ibuprofen (ADVIL,MOTRIN) 200 MG tablet Take 200 mg by mouth every 6 (six) hours as needed for headache or moderate pain (pain).     Historical Provider, MD  ondansetron (ZOFRAN) 4 MG tablet Take 1 tablet (4 mg total) by mouth every 8 (eight) hours as needed for nausea or vomiting. Patient not taking: Reported on 05/06/2014 04/01/14   Carrie Mew, PA-C   BP 139/83 mmHg  Pulse 82  Temp(Src) 97.8 F (36.6 C) (Oral)  Resp 16  SpO2 96% Physical Exam    Constitutional: She is oriented to person, place, and time. She appears well-developed and well-nourished. No distress.  HENT:  Head: Normocephalic.  Eyes: Conjunctivae are normal. Pupils are equal, round, and reactive to light. No scleral icterus.  Neck: Normal range of motion. Neck supple. No thyromegaly present.  Cardiovascular: Normal rate and regular rhythm.  Exam reveals no gallop and no friction rub.   No murmur heard. Pulmonary/Chest: Effort normal and breath sounds normal. No respiratory distress. She has no wheezes. She has no rales.  Abdominal: Soft. Bowel sounds are normal. She exhibits no distension. There is no tenderness. There is no rebound.    Musculoskeletal: Normal range of motion.       Back:  Neurological: She is alert and oriented to person, place, and time.  Skin: Skin is warm and dry. No rash noted.  Psychiatric: She has a normal mood and affect. Her behavior is normal.    ED Course  Procedures (including critical care time) Labs Review Labs Reviewed  URINALYSIS, ROUTINE W REFLEX MICROSCOPIC - Abnormal; Notable for the following:    Hgb urine dipstick TRACE (*)    Leukocytes, UA TRACE (*)    All other components within normal limits  URINE MICROSCOPIC-ADD ON - Abnormal; Notable for the following:    Squamous Epithelial / LPF FEW (*)    Bacteria, UA FEW (*)    All other components within normal limits  URINE CULTURE  CBC WITH DIFFERENTIAL  COMPREHENSIVE METABOLIC PANEL  POC URINE PREG, ED    Imaging Review Ct Abdomen Pelvis W Contrast  05/06/2014   CLINICAL DATA:  Pt c/o LLQ pain x 2 days, states became worse today w/ high BP, elevated WBC, no prior abd surgery Neg preg test today.  EXAM: CT ABDOMEN AND PELVIS WITH CONTRAST  TECHNIQUE: Multidetector CT imaging of the abdomen and pelvis was performed using the standard protocol following bolus administration of intravenous contrast.  CONTRAST:  134mL OMNIPAQUE IOHEXOL 300 MG/ML  SOLN  COMPARISON:   07/21/2010  FINDINGS: Lung bases are within normal.  Abdominal images demonstrate a normal liver, spleen, pancreas, gallbladder and adrenal glands. Kidneys are normal in size without hydronephrosis, focal mass or nephrolithiasis. Ureters are within normal. The appendix is normal. Vascular structures are within normal.  Pelvic images demonstrate the uterus, ovaries, bladder and rectum mid to be within normal. Remaining bony structures are unremarkable.  IMPRESSION: No acute findings in the abdomen/pelvis.   Electronically Signed   By: Marin Olp M.D.   On: 05/06/2014 22:27     EKG Interpretation None      MDM   Final diagnoses:  UTI (lower urinary tract infection)      Benign abdominal exam. Trace of blood and few bacteria few squamous cells 7 at [facility] culture obtained and pending. CT normal 48 hours ago. Not pregnant. I think appropriate treatment is pain control and  treatment pending culture.  Tanna Furry, MD 05/08/14 2128

## 2014-05-09 LAB — URINE CULTURE
Colony Count: NO GROWTH
Culture: NO GROWTH
SPECIAL REQUESTS: NORMAL

## 2014-05-12 ENCOUNTER — Encounter (HOSPITAL_COMMUNITY): Payer: Self-pay | Admitting: Emergency Medicine

## 2014-05-12 ENCOUNTER — Emergency Department (HOSPITAL_COMMUNITY)
Admission: EM | Admit: 2014-05-12 | Discharge: 2014-05-12 | Disposition: A | Discharge status: Home / Self Care | Payer: Medicaid Other | Attending: Emergency Medicine | Admitting: Emergency Medicine

## 2014-05-12 DIAGNOSIS — M545 Low back pain: Secondary | ICD-10-CM | POA: Insufficient documentation

## 2014-05-12 DIAGNOSIS — Z87891 Personal history of nicotine dependence: Secondary | ICD-10-CM | POA: Insufficient documentation

## 2014-05-12 DIAGNOSIS — R1030 Lower abdominal pain, unspecified: Secondary | ICD-10-CM

## 2014-05-12 DIAGNOSIS — Z8742 Personal history of other diseases of the female genital tract: Secondary | ICD-10-CM | POA: Insufficient documentation

## 2014-05-12 DIAGNOSIS — Z792 Long term (current) use of antibiotics: Secondary | ICD-10-CM | POA: Insufficient documentation

## 2014-05-12 DIAGNOSIS — Z87442 Personal history of urinary calculi: Secondary | ICD-10-CM | POA: Insufficient documentation

## 2014-05-12 DIAGNOSIS — F419 Anxiety disorder, unspecified: Secondary | ICD-10-CM | POA: Insufficient documentation

## 2014-05-12 DIAGNOSIS — Z79899 Other long term (current) drug therapy: Secondary | ICD-10-CM | POA: Insufficient documentation

## 2014-05-12 DIAGNOSIS — Z8709 Personal history of other diseases of the respiratory system: Secondary | ICD-10-CM | POA: Insufficient documentation

## 2014-05-12 DIAGNOSIS — K219 Gastro-esophageal reflux disease without esophagitis: Secondary | ICD-10-CM | POA: Insufficient documentation

## 2014-05-12 DIAGNOSIS — Z79818 Long term (current) use of other agents affecting estrogen receptors and estrogen levels: Secondary | ICD-10-CM | POA: Insufficient documentation

## 2014-05-12 LAB — COMPREHENSIVE METABOLIC PANEL
ALK PHOS: 60 U/L (ref 39–117)
ALT: 18 U/L (ref 0–35)
AST: 16 U/L (ref 0–37)
Albumin: 3.8 g/dL (ref 3.5–5.2)
Anion gap: 14 (ref 5–15)
BUN: 10 mg/dL (ref 6–23)
CALCIUM: 9.4 mg/dL (ref 8.4–10.5)
CO2: 25 mEq/L (ref 19–32)
Chloride: 103 mEq/L (ref 96–112)
Creatinine, Ser: 0.79 mg/dL (ref 0.50–1.10)
GFR calc non Af Amer: >90 mL/min (ref >90)
GLUCOSE: 105 mg/dL — AB (ref 70–99)
POTASSIUM: 3.5 meq/L — AB (ref 3.7–5.3)
SODIUM: 142 meq/L (ref 137–147)
Total Bilirubin: 0.3 mg/dL (ref 0.3–1.2)
Total Protein: 7.5 g/dL (ref 6.0–8.3)

## 2014-05-12 LAB — WET PREP, GENITAL
CLUE CELLS WET PREP: NONE SEEN
TRICH WET PREP: NONE SEEN
YEAST WET PREP: NONE SEEN

## 2014-05-12 LAB — URINALYSIS, ROUTINE W REFLEX MICROSCOPIC
BILIRUBIN URINE: NEGATIVE
Glucose, UA: NEGATIVE mg/dL
Ketones, ur: NEGATIVE mg/dL
Leukocytes, UA: NEGATIVE
Nitrite: NEGATIVE
PH: 6 (ref 5.0–8.0)
Protein, ur: NEGATIVE mg/dL
SPECIFIC GRAVITY, URINE: 1.023 (ref 1.005–1.030)
UROBILINOGEN UA: 0.2 mg/dL (ref 0.0–1.0)

## 2014-05-12 LAB — CBC
HCT: 39.2 % (ref 36.0–46.0)
HEMOGLOBIN: 13 g/dL (ref 12.0–15.0)
MCH: 29.9 pg (ref 26.0–34.0)
MCHC: 33.2 g/dL (ref 30.0–36.0)
MCV: 90.1 fL (ref 78.0–100.0)
PLATELETS: 285 10*3/uL (ref 150–400)
RBC: 4.35 MIL/uL (ref 3.87–5.11)
RDW: 12.1 % (ref 11.5–15.5)
WBC: 7.6 10*3/uL (ref 4.0–10.5)

## 2014-05-12 LAB — URINE MICROSCOPIC-ADD ON

## 2014-05-12 NOTE — ED Notes (Signed)
Pt was here on the 12th for sx of UTI, pt now c/o bilateral flank pain and lower back pain. Pt denies urinary problems.

## 2014-05-12 NOTE — Discharge Instructions (Signed)
Abdominal Pain, Women °Abdominal (stomach, pelvic, or belly) pain can be caused by many things. It is important to tell your doctor: °· The location of the pain. °· Does it come and go or is it present all the time? °· Are there things that start the pain (eating certain foods, exercise)? °· Are there other symptoms associated with the pain (fever, nausea, vomiting, diarrhea)? °All of this is helpful to know when trying to find the cause of the pain. °CAUSES  °· Stomach: virus or bacteria infection, or ulcer. °· Intestine: appendicitis (inflamed appendix), regional ileitis (Crohn's disease), ulcerative colitis (inflamed colon), irritable bowel syndrome, diverticulitis (inflamed diverticulum of the colon), or cancer of the stomach or intestine. °· Gallbladder disease or stones in the gallbladder. °· Kidney disease, kidney stones, or infection. °· Pancreas infection or cancer. °· Fibromyalgia (pain disorder). °· Diseases of the female organs: °¨ Uterus: fibroid (non-cancerous) tumors or infection. °¨ Fallopian tubes: infection or tubal pregnancy. °¨ Ovary: cysts or tumors. °¨ Pelvic adhesions (scar tissue). °¨ Endometriosis (uterus lining tissue growing in the pelvis and on the pelvic organs). °¨ Pelvic congestion syndrome (female organs filling up with blood just before the menstrual period). °¨ Pain with the menstrual period. °¨ Pain with ovulation (producing an egg). °¨ Pain with an IUD (intrauterine device, birth control) in the uterus. °¨ Cancer of the female organs. °· Functional pain (pain not caused by a disease, may improve without treatment). °· Psychological pain. °· Depression. °DIAGNOSIS  °Your doctor will decide the seriousness of your pain by doing an examination. °· Blood tests. °· X-rays. °· Ultrasound. °· CT scan (computed tomography, special type of X-ray). °· MRI (magnetic resonance imaging). °· Cultures, for infection. °· Barium enema (dye inserted in the large intestine, to better view it with  X-rays). °· Colonoscopy (looking in intestine with a lighted tube). °· Laparoscopy (minor surgery, looking in abdomen with a lighted tube). °· Major abdominal exploratory surgery (looking in abdomen with a large incision). °TREATMENT  °The treatment will depend on the cause of the pain.  °· Many cases can be observed and treated at home. °· Over-the-counter medicines recommended by your caregiver. °· Prescription medicine. °· Antibiotics, for infection. °· Birth control pills, for painful periods or for ovulation pain. °· Hormone treatment, for endometriosis. °· Nerve blocking injections. °· Physical therapy. °· Antidepressants. °· Counseling with a psychologist or psychiatrist. °· Minor or major surgery. °HOME CARE INSTRUCTIONS  °· Do not take laxatives, unless directed by your caregiver. °· Take over-the-counter pain medicine only if ordered by your caregiver. Do not take aspirin because it can cause an upset stomach or bleeding. °· Try a clear liquid diet (broth or water) as ordered by your caregiver. Slowly move to a bland diet, as tolerated, if the pain is related to the stomach or intestine. °· Have a thermometer and take your temperature several times a day, and record it. °· Bed rest and sleep, if it helps the pain. °· Avoid sexual intercourse, if it causes pain. °· Avoid stressful situations. °· Keep your follow-up appointments and tests, as your caregiver orders. °· If the pain does not go away with medicine or surgery, you may try: °¨ Acupuncture. °¨ Relaxation exercises (yoga, meditation). °¨ Group therapy. °¨ Counseling. °SEEK MEDICAL CARE IF:  °· You notice certain foods cause stomach pain. °· Your home care treatment is not helping your pain. °· You need stronger pain medicine. °· You want your IUD removed. °· You feel faint or   lightheaded. °· You develop nausea and vomiting. °· You develop a rash. °· You are having side effects or an allergy to your medicine. °SEEK IMMEDIATE MEDICAL CARE IF:  °· Your  pain does not go away or gets worse. °· You have a fever. °· Your pain is felt only in portions of the abdomen. The right side could possibly be appendicitis. The left lower portion of the abdomen could be colitis or diverticulitis. °· You are passing blood in your stools (bright red or black tarry stools, with or without vomiting). °· You have blood in your urine. °· You develop chills, with or without a fever. °· You pass out. °MAKE SURE YOU:  °· Understand these instructions. °· Will watch your condition. °· Will get help right away if you are not doing well or get worse. °Document Released: 04/10/2007 Document Revised: 10/28/2013 Document Reviewed: 04/30/2009 °ExitCare® Patient Information ©2015 [employer]. This information is not intended to replace advice given to you by your health care provider. Make sure you discuss any questions you have with your health care provider. ° °Emergency Department Resource Guide °1) Find a Doctor and Pay Out of Pocket °Although you won't have to find out who is covered by your insurance plan, it is a good idea to ask around and get recommendations. You will then need to call the office and see if the doctor you have chosen will accept you as a new patient and what types of options they offer for patients who are self-pay. Some doctors offer discounts or will set up payment plans for their patients who do not have insurance, but you will need to ask so you aren't surprised when you get to your appointment. ° °2) Contact Your Local Health Department °Not all health departments have doctors that can see patients for sick visits, but many do, so it is worth a call to see if yours does. If you don't know where your local health department is, you can check in your phone book. The CDC also has a tool to help you locate your state's health department, and many state websites also have listings of all of their local health departments. ° °3) Find a Walk-in Clinic °If your illness is  not likely to be very severe or complicated, you may want to try a walk in clinic. These are popping up all over the country in pharmacies, drugstores, and shopping centers. They're usually staffed by nurse practitioners or physician assistants that have been trained to treat common illnesses and complaints. They're usually fairly quick and inexpensive. However, if you have serious medical issues or chronic medical problems, these are probably not your best option. ° °No Primary Care Doctor: °- Call [facility] at  832-8000 - they can help you locate a primary care doctor that  accepts your insurance, provides certain services, etc. °- Physician Referral Service- 1-800-533-3463 ° °Chronic Pain Problems: °Organization         Address  Phone   Notes  °[facility]  (336) 297-2271 Patients need to be referred by their primary care doctor.  ° °Medication Assistance: °Organization         Address  Phone   Notes  °[place] [facility] [address] °[place], [place] [address] (336) 641-8030 --Must be a resident of [place] °-- Must have NO insurance coverage whatsoever (no Medicaid/ Medicare, etc.) °-- The pt. MUST have a primary care doctor that directs their care regularly   and follows them in the community °  °[facility]  (866) 331-1348   °[employer]  (888) 892-1162   ° °Agencies that provide inexpensive medical care: °Organization         Address  Phone   Notes  °[facility]  (336) 832-8035   °[facility]    (336) 832-7272   °[facility] [address] °[place], [place] [address] (336) 832-4777   °[facility] [address], °[place] (336) 271-4999   °[facility]    (336) 373-0678   °[facility]    (336) 272-1050   °[facility] ° [address], [place] Phone:  (336) 832-4444, Fax:  (336) 832-4440 Hours of Operation:  9 am - 6  pm, M-F.  Also accepts Medicaid/Medicare and self-pay.  ° [facility] ° [address], [place] Phone: (336) 832-3150, Fax: (336) 832-3151. Hours of Operation:  8:30 am - 5:30 pm, M-F.  Also accepts Medicaid and self-pay.  °[facility] [place] [address], [place] Phone: (336) 878-6027   °[facility] [address], [place], [place] (336)723-1848, Ext. 123 Mondays & Thursdays: 7-9 AM.  First 15 patients are seen on a first come, first serve basis. °  ° °Medicaid-accepting [place] Providers: ° °Organization         Address  Phone   Notes  °[facility] [address], [place] (336) 641-2100 Also accepts self-pay patients.  °[facility] [address], [place] ° (336) 856-9996   °[facility] [address], [place] (336) 288-8857   °[facility] [address] [place] [address], [place] (336) 299-7000   °Veita Bland [address], [place]  ° (336) 373-1557 Only accepts [place] Access Medicaid patients after they have their name applied to their card.  ° °Self-Pay (no insurance) in [place]: ° °Organization         Address  Phone   Notes  °Sickle Cell Patients, [facility] [address], [place] (336) 832-1970   °[facility] [address], [place] (336) 832-4400   °[facility] ° [address], [place] (336) 992-4800   °[facility]/Dr. Osei-Bonsu ° [address] [place] [address], [place] or [address], [place] (336) 841-8500 Phone number for both [place] and [place] locations is the same.  °[facility] [address], [place] (336) 299-0000   °[facility] [address] [place] [address], [place] or [address] (336) 852-7530 °(336) 878-2260   °[facility] [address], [place] (336) 350-1642, phone; (336) 294-5005, fax Sees patients 1st and 3rd Saturday of every month.  Must not qualify for public or private insurance (i.e. Medicaid, Medicare, [place] Health Choice, Veterans' Benefits) • Household income should be no more than 200% of the poverty level •The clinic cannot treat you if you are pregnant or think you are pregnant • Sexually transmitted diseases are not treated at the clinic.  ° ° °Dental Care: °Organization         Address  Phone  Notes  °[facility] [facility] [address], [place] (336) 641-6152 Accepts children up to age 21 who   are enrolled in Medicaid or [place] Health Choice; pregnant women with a Medicaid card; and children who have applied for Medicaid or [place] Health Choice, but were declined, whose parents can pay a reduced fee at time of service.  °[facility] [place]  [address], [place] (336) 641-7733 Accepts children up to age 21 who are enrolled in Medicaid or [place] Health Choice; pregnant women with a Medicaid card; and children who have applied for Medicaid or [place] Health Choice, but were declined, whose parents can pay a reduced fee at time of service.  °[facility] ° [address], [place] (336) 641-4533 Patients are seen by appointment only. Walk-ins are not accepted. [facility] will see patients 18 years of age and older. °Monday - Tuesday (8am-5pm) °Most Wednesdays (8:30-5pm) °$30 per visit, cash only  °[facility] ° [address], [place] (336) 641-4533 Patients are seen by appointment only. Walk-ins are not accepted. [facility] will see patients 18 years of age and older. °One Wednesday Evening (Monthly: Volunteer Based).  $30 per visit, cash only  °[facility]  (919) 537-3737 for adults; Children under age 4, call Graduate Pediatric Dentistry at ([phone])  [phone]. Children aged 4-14, please call (919) [phone] to request a pediatric application. ° Dental services are provided in all areas of dental care including fillings, crowns and bridges, complete and partial dentures, implants, gum treatment, root canals, and extractions. Preventive care is also provided. Treatment is provided to both adults and children. °Patients are selected via a lottery and there is often a waiting list. °  °[facility] [address], °[place] ° (336) 763-8833 [email or web address] °  °[facility] [address], [place], [place] (336)723-1848, Ext. 123 Second and Fourth Thursday of each month, opens at 6:30 AM; Clinic ends at 9 AM.  Patients are seen on a first-come first-served basis, and a limited number are seen during each clinic.  ° °[facility] ° [address], [place], [place] (336) 723-7904   Eligibility Requirements °You must have lived in [place], [place], or [place] counties for at least the last three months. °  You cannot be eligible for state or federal sponsored healthcare insurance, including Veterans Administration, Medicaid, or Medicare. °  You generally cannot be eligible for healthcare insurance through your employer.  °  How to apply: °Eligibility screenings are held every Tuesday and Wednesday afternoon from 1:00 pm until 4:00 pm. You do not need an appointment for the interview!  °[facility] [address], [place], [place] 336-631-2330   °[facility]  336-342-8273   °[facility]  336-703-3100   °[facility]  336-570-6415   ° °Behavioral Health Resources in the Community: °Intensive Outpatient Programs °Organization         Address  Phone  Notes  °[facility] [address], [place], [place] 336-878-6098   °[facility] [address], [place], [place] 336-832-9800   °[facility]  [address], [place], [place] ° 336-882-2125   °[facility] [address],  °[place],  1-800-853-5163 or 336-641-4981   °Substance Abuse Resources °Organization         Address  Phone  Notes  °[facility]  336-882-2125   °[facility]  336-784-9470   °  [facility]  336-285-9073   °[facility]  336-845-3988   °Residential & Outpatient Substance Abuse Program  1-800-659-3381   °Psychological Services °Organization         Address  Phone  Notes  °[facility]  336- 832-9600   °[facility]  336- 378-7881   °[facility] [address], [place] 1-800-853-5163 or 336-641-4981   ° °Mobile Crisis Teams °Organization         Address  Phone  Notes  °[facility], Mobile Crisis Care Unit  1-877-626-1772   °Assertive °[facility] ° [address] [place], [place] 336-834-9664   °Sharon DeEsch [address] °[place] 336-554-5454   ° °Self-Help/Support Groups °Organization         Address  Phone             Notes  °[facility] [place] - variety of support groups  336- 373-1402 Call for more information  °Narcotics Anonymous (NA), [facility] [address], °[place]  2 meetings at this location  ° °Residential Treatment Programs °Organization         Address  Phone  Notes  °[facility] [address],    °[place]  1-866-801-8205   °[facility] ° [address], [place], [place] 704-293-8524   °[facility] [address], [place] 336-845-3988 Admissions: 8am-3pm M-F  °[facility] [address]    °[place], [place] 336-841-1104   °[facility] [address], [place], [place] 336-379-7146   °[facility] [address]  °[place], [place] 336-285-9073   °[facility] Intensive Outpatient [address], [place], [place]  336-852-3033   °[facility] [address]  °[place], [place] 1-877-615-2722 or 336-784-9470   °[facility] [address] [place], [place] 336-227-7417 Accepts Medicaid  °[facility] [address]  ° [place] 1-800-659-3381 Substance Abuse/Addiction Treatment  ° °[place] Behavioral Health Resources °Organization         Address  Phone  Notes  °[facility]  (888) 581-9988   °Julie Brannon, PhD [address] [place], [place]   (336) 349-5553 or (336) 951-0000   °[facility]   [address] °[place], [place] (336) 349-4454   °[facility] [address], [place], [place] (336) 342-8316 Insurance/Medicaid/sponsorship through [facility]  °[facility] [address]                                    [place], [place] (336) 342-8316 Therapy/tele-psych/case  °[facility] [address]  ° [place], [place] (336) 349-2233    °Dr. Arfeen  (336) 349-4544   °[facility] [place]  [employer] [place] [facility] 1) [address], [place] °2) [address], [place] °3)  [address], [place] (336) 349-3220 °(336) 342-7768 ° °(336) 342-8140   °[place] [facility] (336) 342-1394 or (336) 342-3537 (After Hours)    ° ° °

## 2014-05-12 NOTE — ED Provider Notes (Signed)
CSN: [id number]     Arrival date & time 05/12/14  1627 History   First MD Initiated Contact with Patient 05/12/14 1844     Chief Complaint  Patient presents with  . Flank Pain  . Back Pain     (Consider location/radiation/quality/duration/timing/severity/associated sxs/prior Treatment) HPI  The patient poor she's had abdominal pain for almost 2 weeks now. She reports is her lower abdomen and the sides of her lower back. She reports it aches along her lower back and she has a constant cramping discomfort in her abdomen. She's had a normal bowel movement today. No diarrhea. No vomiting no nausea. Patient denies abnormal vaginal discharge. She reports due to her birth control she always has irregular periods. She has been seen at [facility] where she reports she had a CT scan done, she reports they didn't STD testing through urine but no pelvic exam. She reports she was treated for urinary tract infection but her symptoms don't seem to be getting better. She reports that she is currently taking antibiotics as prescribed.The patient reports her last sexual activity was 2 weeks ago. She denies pain with intercourse.  Past Medical History  Diagnosis Date  . Headache(784.0)     sinus  . GERD (gastroesophageal reflux disease)   . Kidney stones     no current problem  . Anxiety   . Deviated septum 09/2011  . 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  . Complication of anesthesia     states small mouth  . Pelvic inflammatory disease    Past Surgical History  Procedure Laterality Date  . Polyps removed      colon  . Wisdom tooth extraction    . Nasal septoplasty w/ turbinoplasty  10/04/2011    Procedure: NASAL SEPTOPLASTY WITH TURBINATE REDUCTION;  Surgeon: Ascencion Dike, MD;  Location: [place];  Service: ENT;  Laterality: Bilateral;  . Tonsillectomy     Family History  Problem Relation Age of Onset  . Cancer Mother   . Hypertension Mother   . Hypertension Father    . Cancer Brother   . Hypertension Other    History  Substance Use Topics  . Smoking status: Former Smoker -- 0.25 packs/day  . Smokeless tobacco: Never Used  . Alcohol Use: No   OB History    Gravida Para Term Preterm AB TAB SAB Ectopic Multiple Living   4 4 4       4      Review of Systems  10 Systems reviewed and are negative for acute change except as noted in the HPI.   Allergies  Sulfa antibiotics  Home Medications   Prior to Admission medications   Medication Sig Start Date End Date Taking? Authorizing Provider  ciprofloxacin (CIPRO) 500 MG tablet Take 1 tablet (500 mg total) by mouth every 12 (twelve) hours. 05/08/14  Yes Tanna Furry, MD  clonazePAM (KLONOPIN) 1 MG tablet Take 1 mg by mouth daily.    Yes Historical Provider, MD  HYDROcodone-acetaminophen (NORCO/VICODIN) 5-325 MG per tablet Take 2 tablets by mouth every 4 (four) hours as needed. 05/08/14  Yes Tanna Furry, MD  levonorgestrel-ethinyl estradiol (AUBRA) 0.1-20 MG-MCG tablet Take 1 tablet by mouth daily.   Yes Historical Provider, MD  omeprazole (PRILOSEC) 20 MG capsule Take 1 capsule (20 mg total) by mouth daily. 04/01/14  Yes Carrie Mew, PA-C  ibuprofen (ADVIL,MOTRIN) 200 MG tablet Take 200 mg by mouth every 6 (six) hours as needed for headache  or moderate pain (pain).     Historical Provider, MD  ibuprofen (ADVIL,MOTRIN) 600 MG tablet Take 600 mg by mouth every 8 (eight) hours as needed for moderate pain (pain).     Historical Provider, MD  ondansetron (ZOFRAN) 4 MG tablet Take 1 tablet (4 mg total) by mouth every 8 (eight) hours as needed for nausea or vomiting. Patient not taking: Reported on 05/06/2014 04/01/14   Carrie Mew, PA-C   BP 161/85 mmHg  Pulse 69  Temp(Src) 97.8 F (36.6 C) (Oral)  Resp 18  SpO2 100% Physical Exam  Constitutional: She is oriented to person, place, and time. She appears well-developed and well-nourished.  HENT:  Head: Normocephalic and atraumatic.  Eyes: EOM are  normal. Pupils are equal, round, and reactive to light.  Neck: Neck supple.  Cardiovascular: Normal rate, regular rhythm, normal heart sounds and intact distal pulses.   Pulmonary/Chest: Effort normal and breath sounds normal.  Abdominal: Soft. Bowel sounds are normal. She exhibits no distension and no mass. There is tenderness. There is no rebound and no guarding.  Patient endorses mild diffuse lower abdominal tenderness is slightly more to the left than the right. There is no guarding  Genitourinary: Vagina normal and uterus normal.  There is a small amount of yellow tinged discharge in the vaginal vault. Cervix is not friable. No significant cervical motion tenderness. Mild uterine tenderness. Adnexa are nontender without mass.  Musculoskeletal: Normal range of motion. She exhibits no edema.  Neurological: She is alert and oriented to person, place, and time. She has normal strength. Coordination normal. GCS eye subscore is 4. GCS verbal subscore is 5. GCS motor subscore is 6.  Skin: Skin is warm, dry and intact.  Psychiatric: She has a normal mood and affect.    ED Course  Procedures (including critical care time) Labs Review Labs Reviewed  COMPREHENSIVE METABOLIC PANEL - Abnormal; Notable for the following:    Potassium 3.5 (*)    Glucose, Bld 105 (*)    All other components within normal limits  URINALYSIS, ROUTINE W REFLEX MICROSCOPIC - Abnormal; Notable for the following:    Hgb urine dipstick TRACE (*)    All other components within normal limits  URINE MICROSCOPIC-ADD ON - Abnormal; Notable for the following:    Squamous Epithelial / LPF FEW (*)    All other components within normal limits  WET PREP, GENITAL  GC/CHLAMYDIA PROBE AMP  CBC    Imaging Review No results found.   EKG Interpretation None      MDM   Final diagnoses:  Lower abdominal pain   At this point in time patient has abdominal pain of unclear etiology. Pelvic examination is not suggestive of PID.  She also reports that she had been seen at [facility] and had initial STD testing done that was negative. CT scan was done without acute findings. At this point on physical examination she has a benign abdomen abdominal exam. There is no guarding and minimal localizing of pain slightly greater to the left. UA does not indicate urinary tract infection. Pregnancy test was negative as of 2 days ago. At this time I recommend the patient follow-up with family provider.    Charlesetta Shanks, MD 05/12/14 2059

## 2014-05-13 LAB — GC/CHLAMYDIA PROBE AMP
CT Probe RNA: NEGATIVE
GC PROBE AMP APTIMA: NEGATIVE

## 2014-06-26 ENCOUNTER — Emergency Department: Payer: Self-pay | Admitting: Emergency Medicine

## 2014-07-12 ENCOUNTER — Emergency Department (HOSPITAL_BASED_OUTPATIENT_CLINIC_OR_DEPARTMENT_OTHER)
Admission: EM | Admit: 2014-07-12 | Discharge: 2014-07-13 | Disposition: A | Discharge status: Home / Self Care | Payer: Medicaid Other | Attending: Emergency Medicine | Admitting: Emergency Medicine

## 2014-07-12 ENCOUNTER — Emergency Department (HOSPITAL_BASED_OUTPATIENT_CLINIC_OR_DEPARTMENT_OTHER): Payer: Medicaid Other

## 2014-07-12 ENCOUNTER — Encounter (HOSPITAL_BASED_OUTPATIENT_CLINIC_OR_DEPARTMENT_OTHER): Payer: Self-pay | Admitting: Emergency Medicine

## 2014-07-12 DIAGNOSIS — F419 Anxiety disorder, unspecified: Secondary | ICD-10-CM | POA: Insufficient documentation

## 2014-07-12 DIAGNOSIS — B349 Viral infection, unspecified: Secondary | ICD-10-CM | POA: Insufficient documentation

## 2014-07-12 DIAGNOSIS — Z3202 Encounter for pregnancy test, result negative: Secondary | ICD-10-CM | POA: Insufficient documentation

## 2014-07-12 DIAGNOSIS — R079 Chest pain, unspecified: Secondary | ICD-10-CM

## 2014-07-12 DIAGNOSIS — Z87891 Personal history of nicotine dependence: Secondary | ICD-10-CM | POA: Insufficient documentation

## 2014-07-12 DIAGNOSIS — K219 Gastro-esophageal reflux disease without esophagitis: Secondary | ICD-10-CM | POA: Insufficient documentation

## 2014-07-12 DIAGNOSIS — Z8742 Personal history of other diseases of the female genital tract: Secondary | ICD-10-CM | POA: Insufficient documentation

## 2014-07-12 DIAGNOSIS — Z792 Long term (current) use of antibiotics: Secondary | ICD-10-CM | POA: Insufficient documentation

## 2014-07-12 DIAGNOSIS — Z79899 Other long term (current) drug therapy: Secondary | ICD-10-CM | POA: Insufficient documentation

## 2014-07-12 DIAGNOSIS — R072 Precordial pain: Secondary | ICD-10-CM | POA: Insufficient documentation

## 2014-07-12 DIAGNOSIS — Z87442 Personal history of urinary calculi: Secondary | ICD-10-CM | POA: Insufficient documentation

## 2014-07-12 LAB — URINALYSIS, ROUTINE W REFLEX MICROSCOPIC
BILIRUBIN URINE: NEGATIVE
Glucose, UA: NEGATIVE mg/dL
Ketones, ur: NEGATIVE mg/dL
Nitrite: NEGATIVE
PH: 6.5 (ref 5.0–8.0)
PROTEIN: NEGATIVE mg/dL
Specific Gravity, Urine: 1.006 (ref 1.005–1.030)
Urobilinogen, UA: 0.2 mg/dL (ref 0.0–1.0)

## 2014-07-12 LAB — CBC WITH DIFFERENTIAL/PLATELET
BASOS PCT: 1 % (ref 0–1)
Basophils Absolute: 0 10*3/uL (ref 0.0–0.1)
Eosinophils Absolute: 0.4 10*3/uL (ref 0.0–0.7)
Eosinophils Relative: 6 % — ABNORMAL HIGH (ref 0–5)
HCT: 37.5 % (ref 36.0–46.0)
HEMOGLOBIN: 12.4 g/dL (ref 12.0–15.0)
Lymphocytes Relative: 32 % (ref 12–46)
Lymphs Abs: 2.3 10*3/uL (ref 0.7–4.0)
MCH: 29.7 pg (ref 26.0–34.0)
MCHC: 33.1 g/dL (ref 30.0–36.0)
MCV: 89.7 fL (ref 78.0–100.0)
Monocytes Absolute: 0.7 10*3/uL (ref 0.1–1.0)
Monocytes Relative: 11 % (ref 3–12)
NEUTROS ABS: 3.6 10*3/uL (ref 1.7–7.7)
Neutrophils Relative %: 50 % (ref 43–77)
Platelets: 303 10*3/uL (ref 150–400)
RBC: 4.18 MIL/uL (ref 3.87–5.11)
RDW: 12.3 % (ref 11.5–15.5)
WBC: 7 10*3/uL (ref 4.0–10.5)

## 2014-07-12 LAB — BASIC METABOLIC PANEL
ANION GAP: 5 (ref 5–15)
BUN: 8 mg/dL (ref 6–23)
CO2: 28 mmol/L (ref 19–32)
Calcium: 8.9 mg/dL (ref 8.4–10.5)
Chloride: 105 mEq/L (ref 96–112)
Creatinine, Ser: 0.62 mg/dL (ref 0.50–1.10)
GFR calc non Af Amer: >90 mL/min (ref >90)
Glucose, Bld: 99 mg/dL (ref 70–99)
Potassium: 3.2 mmol/L — ABNORMAL LOW (ref 3.5–5.1)
Sodium: 138 mmol/L (ref 135–145)

## 2014-07-12 LAB — URINE MICROSCOPIC-ADD ON

## 2014-07-12 LAB — PREGNANCY, URINE: Preg Test, Ur: NEGATIVE

## 2014-07-12 LAB — TROPONIN I

## 2014-07-12 MED ORDER — NAPROXEN 500 MG PO TABS: 500.0000 mg | ORAL_TABLET | Freq: Two times a day (BID) | ORAL | Status: DC

## 2014-07-12 MED ORDER — NAPROXEN 250 MG PO TABS
500.0000 mg | ORAL_TABLET | Freq: Once | ORAL | Status: AC
  Administered 2014-07-12: 500 mg via ORAL
  Filled 2014-07-12: qty 2

## 2014-07-12 MED ORDER — ONDANSETRON 4 MG PO TBDP: 4.0000 mg | ORAL_TABLET | Freq: Three times a day (TID) | ORAL | Status: DC | PRN

## 2014-07-12 MED ORDER — ONDANSETRON 8 MG PO TBDP
8.0000 mg | ORAL_TABLET | Freq: Once | ORAL | Status: AC
  Administered 2014-07-12: 8 mg via ORAL
  Filled 2014-07-12: qty 1

## 2014-07-12 NOTE — Discharge Instructions (Signed)
Take the antinausea medicine as directed. Take the Naprosyn for the headache in the bodyaches. Follow-up with your doctor in the next few days. Return for any new or worse symptoms.

## 2014-07-12 NOTE — ED Provider Notes (Addendum)
CSN: [id number]     Arrival date & time 07/12/14  2050 History  This chart was scribed for Christina Sorrow, MD by Randa Evens, ED Scribe. This patient was seen in room MH04/MH04 and the patient's care was started at 9:30 PM.      Chief Complaint  Patient presents with  . Chest Pain   Patient is a 34 y.o. female presenting with chest pain. The history is provided by the patient. No language interpreter was used.  Chest Pain Pain location:  Substernal area Pain radiates to:  Does not radiate Pain radiates to the back: no   Pain severity:  Mild Duration:  1 day Timing:  Intermittent Chronicity:  New Context: eating   Relieved by:  Nothing Worsened by:  Nothing tried Associated symptoms: dizziness, headache, nausea and shortness of breath   Associated symptoms: no abdominal pain, no back pain, no cough, no fever and not vomiting   Risk factors: birth control    HPI Comments: Christina Fox is a 34 y.o. female who presents to the Emergency Department complaining of intermittent chest pain onset today PTA. Pt states she has associated SOB, dizziness, nausea, and feels as if she has had near syncopal episode. Pt sates she has had HA and neck pain that began this morning. Pt states she has also had urinary frequency in the last hour. Pt states she feels nauseated after eating. Pt states she has tried ibuprofen with no relief. Pt denies vomiting or any other related symptoms. Pt states she is on birth control.    PCP Dr. Mellody Drown    Past Medical History  Diagnosis Date  . Headache(784.0)     sinus  . GERD (gastroesophageal reflux disease)   . Kidney stones     no current problem  . Anxiety   . Deviated septum 09/2011  . 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  . Complication of anesthesia     states small mouth  . Pelvic inflammatory disease    Past Surgical History  Procedure Laterality Date  . Polyps removed      colon  . Wisdom tooth extraction    . Nasal  septoplasty w/ turbinoplasty  10/04/2011    Procedure: NASAL SEPTOPLASTY WITH TURBINATE REDUCTION;  Surgeon: Ascencion Dike, MD;  Location: [facility];  Service: ENT;  Laterality: Bilateral;  . Tonsillectomy     Family History  Problem Relation Age of Onset  . Cancer Mother   . Hypertension Mother   . Hypertension Father   . Cancer Brother   . Hypertension Other    History  Substance Use Topics  . Smoking status: Former Smoker -- 0.25 packs/day  . Smokeless tobacco: Never Used  . Alcohol Use: No   OB History    Gravida Para Term Preterm AB TAB SAB Ectopic Multiple Living   4 4 4       4       Review of Systems  Constitutional: Negative for fever and chills.  HENT: Negative for rhinorrhea and sore throat.   Eyes: Positive for visual disturbance.  Respiratory: Positive for shortness of breath. Negative for cough.   Cardiovascular: Positive for chest pain. Negative for leg swelling.  Gastrointestinal: Positive for nausea. Negative for vomiting, abdominal pain and diarrhea.  Genitourinary: Positive for frequency. Negative for dysuria and hematuria.  Musculoskeletal: Positive for neck pain. Negative for back pain.  Skin: Negative for rash.  Neurological: Positive for dizziness and headaches.  Psychiatric/Behavioral: Negative  for confusion.  All other systems reviewed and are negative.    Allergies  Sulfa antibiotics  Home Medications   Prior to Admission medications   Medication Sig Start Date End Date Taking? Authorizing Provider  ciprofloxacin (CIPRO) 500 MG tablet Take 1 tablet (500 mg total) by mouth every 12 (twelve) hours. 05/08/14   Tanna Furry, MD  clonazePAM (KLONOPIN) 1 MG tablet Take 1 mg by mouth daily.     Historical Provider, MD  HYDROcodone-acetaminophen (NORCO/VICODIN) 5-325 MG per tablet Take 2 tablets by mouth every 4 (four) hours as needed. 05/08/14   Tanna Furry, MD  ibuprofen (ADVIL,MOTRIN) 200 MG tablet Take 200 mg by mouth every 6 (six) hours  as needed for headache or moderate pain (pain).     Historical Provider, MD  ibuprofen (ADVIL,MOTRIN) 600 MG tablet Take 600 mg by mouth every 8 (eight) hours as needed for moderate pain (pain).     Historical Provider, MD  levonorgestrel-ethinyl estradiol (AUBRA) 0.1-20 MG-MCG tablet Take 1 tablet by mouth daily.    Historical Provider, MD  omeprazole (PRILOSEC) 20 MG capsule Take 1 capsule (20 mg total) by mouth daily. 04/01/14   Carrie Mew, PA-C  ondansetron (ZOFRAN) 4 MG tablet Take 1 tablet (4 mg total) by mouth every 8 (eight) hours as needed for nausea or vomiting. Patient not taking: Reported on 05/06/2014 04/01/14   Carrie Mew, PA-C   BP 145/93 mmHg  Pulse 65  Temp(Src) 97.9 F (36.6 C)  Resp 20  Ht 5\' 2"  (1.575 m)  Wt 194 lb (87.998 kg)  BMI 35.47 kg/m2  SpO2 98%   Physical Exam  Constitutional: She is oriented to person, place, and time. She appears well-developed and well-nourished. No distress.  HENT:  Head: Normocephalic and atraumatic.  Mouth/Throat: Oropharynx is clear and moist.  Eyes: Conjunctivae and EOM are normal. Pupils are equal, round, and reactive to light.  Neck: Neck supple. No tracheal deviation present.  Cardiovascular: Normal rate and regular rhythm.   No murmur heard. Pulmonary/Chest: Effort normal and breath sounds normal. No respiratory distress. She has no wheezes. She has no rales.  Substernal chest tenderness to plaplation  Abdominal: Soft. Bowel sounds are normal. There is no tenderness.  Musculoskeletal: Normal range of motion. She exhibits no edema.  Neurological: She is alert and oriented to person, place, and time. No cranial nerve deficit.  Skin: Skin is warm and dry.  Psychiatric: She has a normal mood and affect. Her behavior is normal.  Nursing note and vitals reviewed.   ED Course  Procedures (including critical care time) DIAGNOSTIC STUDIES: Oxygen Saturation is 98% on RA, normal by my interpretation.    COORDINATION OF  CARE: 9:52 PM-Discussed treatment plan with pt at bedside and pt agreed to plan.     Labs Review Labs Reviewed  CBC WITH DIFFERENTIAL - Abnormal; Notable for the following:    Eosinophils Relative 6 (*)    All other components within normal limits  BASIC METABOLIC PANEL - Abnormal; Notable for the following:    Potassium 3.2 (*)    All other components within normal limits  URINALYSIS, ROUTINE W REFLEX MICROSCOPIC - Abnormal; Notable for the following:    Hgb urine dipstick SMALL (*)    Leukocytes, UA TRACE (*)    All other components within normal limits  URINE MICROSCOPIC-ADD ON - Abnormal; Notable for the following:    Bacteria, UA FEW (*)    All other components within normal limits  PREGNANCY, URINE  TROPONIN I   Results for orders placed or performed during the hospital encounter of 07/12/14  CBC with Differential  Result Value Ref Range   WBC 7.0 4.0 - 10.5 K/uL   RBC 4.18 3.87 - 5.11 MIL/uL   Hemoglobin 12.4 12.0 - 15.0 g/dL   HCT 37.5 36.0 - 46.0 %   MCV 89.7 78.0 - 100.0 fL   MCH 29.7 26.0 - 34.0 pg   MCHC 33.1 30.0 - 36.0 g/dL   RDW 12.3 11.5 - 15.5 %   Platelets 303 150 - 400 K/uL   Neutrophils Relative % 50 43 - 77 %   Neutro Abs 3.6 1.7 - 7.7 K/uL   Lymphocytes Relative 32 12 - 46 %   Lymphs Abs 2.3 0.7 - 4.0 K/uL   Monocytes Relative 11 3 - 12 %   Monocytes Absolute 0.7 0.1 - 1.0 K/uL   Eosinophils Relative 6 (H) 0 - 5 %   Eosinophils Absolute 0.4 0.0 - 0.7 K/uL   Basophils Relative 1 0 - 1 %   Basophils Absolute 0.0 0.0 - 0.1 K/uL  Basic metabolic panel  Result Value Ref Range   Sodium 138 135 - 145 mmol/L   Potassium 3.2 (L) 3.5 - 5.1 mmol/L   Chloride 105 96 - 112 mEq/L   CO2 28 19 - 32 mmol/L   Glucose, Bld 99 70 - 99 mg/dL   BUN 8 6 - 23 mg/dL   Creatinine, Ser 0.62 0.50 - 1.10 mg/dL   Calcium 8.9 8.4 - 10.5 mg/dL   GFR calc non Af Amer >90 >90 mL/min   GFR calc Af Amer >90 >90 mL/min   Anion gap 5 5 - 15  Urinalysis, Routine w reflex  microscopic  Result Value Ref Range   Color, Urine YELLOW YELLOW   APPearance CLEAR CLEAR   Specific Gravity, Urine 1.006 1.005 - 1.030   pH 6.5 5.0 - 8.0   Glucose, UA NEGATIVE NEGATIVE mg/dL   Hgb urine dipstick SMALL (A) NEGATIVE   Bilirubin Urine NEGATIVE NEGATIVE   Ketones, ur NEGATIVE NEGATIVE mg/dL   Protein, ur NEGATIVE NEGATIVE mg/dL   Urobilinogen, UA 0.2 0.0 - 1.0 mg/dL   Nitrite NEGATIVE NEGATIVE   Leukocytes, UA TRACE (A) NEGATIVE  Pregnancy, urine  Result Value Ref Range   Preg Test, Ur NEGATIVE NEGATIVE  Troponin I  Result Value Ref Range   Troponin I <0.03 <0.031 ng/mL  Urine microscopic-add on  Result Value Ref Range   Squamous Epithelial / LPF RARE RARE   WBC, UA 3-6 <3 WBC/hpf   RBC / HPF 0-2 <3 RBC/hpf   Bacteria, UA FEW (A) RARE     Imaging Review Chest x-ray pending radiology review but reviewed by me no acute pulmonary findings. No results found.   EKG Interpretation   Date/Time:  Saturday July 12 2014 21:05:31 EST Ventricular Rate:  70 PR Interval:  142 QRS Duration: 86 QT Interval:  402 QTC Calculation: 434 R Axis:   81 Text Interpretation:  Normal sinus rhythm Nonspecific ST abnormality  Abnormal ECG No previous ECGs available Confirmed by Rhylan Gross  MD, Mayetta Castleman  ([id number]) on 07/12/2014 10:26:28 PM      MDM   Final diagnoses:  Chest pain  Viral illness    Patient with a constellation of symptoms including chest pain. Chest pain workup no acute changes on EKG troponin is negative. Chest x-ray is still pending. Labs without any significant abnormalities. Pregnant no evidence urinary tract infection. Patient's other  symptoms are suggestive of perhaps viral illness with some headache and some body aches and some neck discomfort. Also has had some nausea. If chest x-rays negative patient can be discharged home. With anti-inflammatory medicine. An anti-nausea medicine.  I personally performed the services described in this documentation,  which was scribed in my presence. The recorded information has been reviewed and is accurate.       Christina Sorrow, MD 07/12/14 1121  Christina Sorrow, MD 07/12/14 2325

## 2014-07-12 NOTE — ED Notes (Signed)
Back from xray, up to b/r.

## 2014-07-12 NOTE — ED Notes (Signed)
Pt presents to ED with complaints of tightness midsternal and syncope and felling nausea every time she eats.  Pt states she recently had bronchitis

## 2014-07-12 NOTE — ED Notes (Signed)
Pt to xray, alert, NAD, calm, interactive, resps e/u, speaking in clear complete sentences, VSS. C/o HA, 9/10 and nausea. Also mentions feeling of intermittent CP, neck pain, near syncope & dizziness PTA, (denies: CP at this time, sob, blurred vision, vd, fever, cough, congestion, cold sx, bleeding).

## 2014-07-12 NOTE — ED Notes (Signed)
EDP at [facility] 

## 2014-07-13 ENCOUNTER — Emergency Department (HOSPITAL_COMMUNITY)
Admission: EM | Admit: 2014-07-13 | Discharge: 2014-07-13 | Disposition: A | Discharge status: Home / Self Care | Payer: Medicaid Other | Attending: Emergency Medicine | Admitting: Emergency Medicine

## 2014-07-13 ENCOUNTER — Emergency Department (HOSPITAL_COMMUNITY): Payer: Medicaid Other

## 2014-07-13 ENCOUNTER — Encounter (HOSPITAL_COMMUNITY): Payer: Self-pay | Admitting: Emergency Medicine

## 2014-07-13 DIAGNOSIS — R51 Headache: Secondary | ICD-10-CM

## 2014-07-13 DIAGNOSIS — Z87442 Personal history of urinary calculi: Secondary | ICD-10-CM | POA: Insufficient documentation

## 2014-07-13 DIAGNOSIS — R519 Headache, unspecified: Secondary | ICD-10-CM

## 2014-07-13 DIAGNOSIS — J0110 Acute frontal sinusitis, unspecified: Secondary | ICD-10-CM | POA: Insufficient documentation

## 2014-07-13 DIAGNOSIS — F419 Anxiety disorder, unspecified: Secondary | ICD-10-CM | POA: Insufficient documentation

## 2014-07-13 DIAGNOSIS — Z793 Long term (current) use of hormonal contraceptives: Secondary | ICD-10-CM | POA: Insufficient documentation

## 2014-07-13 DIAGNOSIS — K219 Gastro-esophageal reflux disease without esophagitis: Secondary | ICD-10-CM | POA: Insufficient documentation

## 2014-07-13 DIAGNOSIS — B349 Viral infection, unspecified: Secondary | ICD-10-CM | POA: Insufficient documentation

## 2014-07-13 DIAGNOSIS — Z791 Long term (current) use of non-steroidal anti-inflammatories (NSAID): Secondary | ICD-10-CM | POA: Insufficient documentation

## 2014-07-13 DIAGNOSIS — Z87891 Personal history of nicotine dependence: Secondary | ICD-10-CM | POA: Insufficient documentation

## 2014-07-13 DIAGNOSIS — M542 Cervicalgia: Secondary | ICD-10-CM | POA: Insufficient documentation

## 2014-07-13 DIAGNOSIS — Z8742 Personal history of other diseases of the female genital tract: Secondary | ICD-10-CM | POA: Insufficient documentation

## 2014-07-13 DIAGNOSIS — Z79899 Other long term (current) drug therapy: Secondary | ICD-10-CM | POA: Insufficient documentation

## 2014-07-13 LAB — CBC WITH DIFFERENTIAL/PLATELET
BASOS ABS: 0 10*3/uL (ref 0.0–0.1)
Basophils Relative: 1 % (ref 0–1)
Eosinophils Absolute: 0.4 10*3/uL (ref 0.0–0.7)
Eosinophils Relative: 5 % (ref 0–5)
HEMATOCRIT: 38.3 % (ref 36.0–46.0)
HEMOGLOBIN: 12.7 g/dL (ref 12.0–15.0)
LYMPHS PCT: 26 % (ref 12–46)
Lymphs Abs: 1.8 10*3/uL (ref 0.7–4.0)
MCH: 29.3 pg (ref 26.0–34.0)
MCHC: 33.2 g/dL (ref 30.0–36.0)
MCV: 88.5 fL (ref 78.0–100.0)
Monocytes Absolute: 0.7 10*3/uL (ref 0.1–1.0)
Monocytes Relative: 9 % (ref 3–12)
NEUTROS PCT: 59 % (ref 43–77)
Neutro Abs: 4.1 10*3/uL (ref 1.7–7.7)
Platelets: 291 10*3/uL (ref 150–400)
RBC: 4.33 MIL/uL (ref 3.87–5.11)
RDW: 12.3 % (ref 11.5–15.5)
WBC: 7 10*3/uL (ref 4.0–10.5)

## 2014-07-13 LAB — TROPONIN I: Troponin I: <0.03 ng/mL (ref <0.031)

## 2014-07-13 LAB — BASIC METABOLIC PANEL
Anion gap: 5 (ref 5–15)
BUN: 9 mg/dL (ref 6–23)
CHLORIDE: 107 meq/L (ref 96–112)
CO2: 26 mmol/L (ref 19–32)
Calcium: 8.7 mg/dL (ref 8.4–10.5)
Creatinine, Ser: 0.62 mg/dL (ref 0.50–1.10)
GFR calc non Af Amer: >90 mL/min (ref >90)
Glucose, Bld: 78 mg/dL (ref 70–99)
Potassium: 3.7 mmol/L (ref 3.5–5.1)
Sodium: 138 mmol/L (ref 135–145)

## 2014-07-13 MED ORDER — IBUPROFEN 400 MG PO TABS: 400.0000 mg | ORAL_TABLET | Freq: Four times a day (QID) | ORAL | Status: DC | PRN

## 2014-07-13 MED ORDER — TRAMADOL HCL 50 MG PO TABS: 50.0000 mg | ORAL_TABLET | Freq: Four times a day (QID) | ORAL | Status: DC | PRN

## 2014-07-13 MED ORDER — FEXOFENADINE-PSEUDOEPHED ER 60-120 MG PO TB12: 1.0000 | ORAL_TABLET | Freq: Two times a day (BID) | ORAL | Status: DC

## 2014-07-13 MED ORDER — DEXAMETHASONE SODIUM PHOSPHATE 10 MG/ML IJ SOLN
10.0000 mg | Freq: Once | INTRAMUSCULAR | Status: AC
  Administered 2014-07-13: 10 mg via INTRAVENOUS
  Filled 2014-07-13: qty 1

## 2014-07-13 MED ORDER — SODIUM CHLORIDE 0.9 % IV BOLUS (SEPSIS)
1000.0000 mL | Freq: Once | INTRAVENOUS | Status: AC
  Administered 2014-07-13: 1000 mL via INTRAVENOUS

## 2014-07-13 MED ORDER — METOCLOPRAMIDE HCL 5 MG/ML IJ SOLN
10.0000 mg | Freq: Once | INTRAMUSCULAR | Status: AC
  Administered 2014-07-13: 10 mg via INTRAVENOUS
  Filled 2014-07-13: qty 2

## 2014-07-13 NOTE — ED Notes (Signed)
Pt states that she was seen at [facility] last night for same symptoms but states that headache has been continuous as well as dizziness despite the medications they have given her.  Pt adding that her throat is sore and rash appeared on her face starting today.

## 2014-07-13 NOTE — Discharge Instructions (Signed)
We saw you in the ER for headaches.  All the labs and imaging are normal. We are not sure what is causing your headaches, however, there appears to be no evidence of brain infection, bleeds or tumors based on our exam and results.  Please take motrin round the clock for the next 6 hours, and take other meds prescribed only for break through pain. See your doctor if the pain persists, as you might need better medications or a specialist.   Headaches, Frequently Asked Questions MIGRAINE HEADACHES Q: What is migraine? What causes it? How can I treat it? A: Generally, migraine headaches begin as a dull ache. Then they develop into a constant, throbbing, and pulsating pain. You may experience pain at the temples. You may experience pain at the front or back of one or both sides of the head. The pain is usually accompanied by a combination of:  Nausea.  Vomiting.  Sensitivity to light and noise. Some people (about 15%) experience an aura (see below) before an attack. The cause of migraine is believed to be chemical reactions in the brain. Treatment for migraine may include over-the-counter or prescription medications. It may also include self-help techniques. These include relaxation training and biofeedback.  Q: What is an aura? A: About 15% of people with migraine get an "aura". This is a sign of neurological symptoms that occur before a migraine headache. You may see wavy or jagged lines, dots, or flashing lights. You might experience tunnel vision or blind spots in one or both eyes. The aura can include visual or auditory hallucinations (something imagined). It may include disruptions in smell (such as strange odors), taste or touch. Other symptoms include:  Numbness.  A "pins and needles" sensation.  Difficulty in recalling or speaking the correct word. These neurological events may last as long as 60 minutes. These symptoms will fade as the headache begins. Q: What is a trigger? A:  Certain physical or environmental factors can lead to or "trigger" a migraine. These include:  Foods.  Hormonal changes.  Weather.  Stress. It is important to remember that triggers are different for everyone. To help prevent migraine attacks, you need to figure out which triggers affect you. Keep a headache diary. This is a good way to track triggers. The diary will help you talk to your healthcare professional about your condition. Q: Does weather affect migraines? A: Bright sunshine, hot, humid conditions, and drastic changes in barometric pressure may lead to, or "trigger," a migraine attack in some people. But studies have shown that weather does not act as a trigger for everyone with migraines. Q: What is the link between migraine and hormones? A: Hormones start and regulate many of your body's functions. Hormones keep your body in balance within a constantly changing environment. The levels of hormones in your body are unbalanced at times. Examples are during menstruation, pregnancy, or menopause. That can lead to a migraine attack. In fact, about three quarters of all women with migraine report that their attacks are related to the menstrual cycle.  Q: Is there an increased risk of stroke for migraine sufferers? A: The likelihood of a migraine attack causing a stroke is very remote. That is not to say that migraine sufferers cannot have a stroke associated with their migraines. In persons under age 79, the most common associated factor for stroke is migraine headache. But over the course of a person's normal life span, the occurrence of migraine headache may actually be associated with a  reduced risk of dying from cerebrovascular disease due to stroke.  Q: What are acute medications for migraine? A: Acute medications are used to treat the pain of the headache after it has started. Examples over-the-counter medications, NSAIDs, ergots, and triptans.  Q: What are the triptans? A: Triptans  are the newest class of abortive medications. They are specifically targeted to treat migraine. Triptans are vasoconstrictors. They moderate some chemical reactions in the brain. The triptans work on receptors in your brain. Triptans help to restore the balance of a neurotransmitter called serotonin. Fluctuations in levels of serotonin are thought to be a main cause of migraine.  Q: Are over-the-counter medications for migraine effective? A: Over-the-counter, or "OTC," medications may be effective in relieving mild to moderate pain and associated symptoms of migraine. But you should see your caregiver before beginning any treatment regimen for migraine.  Q: What are preventive medications for migraine? A: Preventive medications for migraine are sometimes referred to as "prophylactic" treatments. They are used to reduce the frequency, severity, and length of migraine attacks. Examples of preventive medications include antiepileptic medications, antidepressants, beta-blockers, calcium channel blockers, and NSAIDs (nonsteroidal anti-inflammatory drugs). Q: Why are anticonvulsants used to treat migraine? A: During the past few years, there has been an increased interest in antiepileptic drugs for the prevention of migraine. They are sometimes referred to as "anticonvulsants". Both epilepsy and migraine may be caused by similar reactions in the brain.  Q: Why are antidepressants used to treat migraine? A: Antidepressants are typically used to treat people with depression. They may reduce migraine frequency by regulating chemical levels, such as serotonin, in the brain.  Q: What alternative therapies are used to treat migraine? A: The term "alternative therapies" is often used to describe treatments considered outside the scope of conventional Western medicine. Examples of alternative therapy include acupuncture, acupressure, and yoga. Another common alternative treatment is herbal therapy. Some herbs are  believed to relieve headache pain. Always discuss alternative therapies with your caregiver before proceeding. Some herbal products contain arsenic and other toxins. TENSION HEADACHES Q: What is a tension-type headache? What causes it? How can I treat it? A: Tension-type headaches occur randomly. They are often the result of temporary stress, anxiety, fatigue, or anger. Symptoms include soreness in your temples, a tightening band-like sensation around your head (a "vice-like" ache). Symptoms can also include a pulling feeling, pressure sensations, and contracting head and neck muscles. The headache begins in your forehead, temples, or the back of your head and neck. Treatment for tension-type headache may include over-the-counter or prescription medications. Treatment may also include self-help techniques such as relaxation training and biofeedback. CLUSTER HEADACHES Q: What is a cluster headache? What causes it? How can I treat it? A: Cluster headache gets its name because the attacks come in groups. The pain arrives with little, if any, warning. It is usually on one side of the head. A tearing or bloodshot eye and a runny nose on the same side of the headache may also accompany the pain. Cluster headaches are believed to be caused by chemical reactions in the brain. They have been described as the most severe and intense of any headache type. Treatment for cluster headache includes prescription medication and oxygen. SINUS HEADACHES Q: What is a sinus headache? What causes it? How can I treat it? A: When a cavity in the bones of the face and skull (a sinus) becomes inflamed, the inflammation will cause localized pain. This condition is usually the result of an  allergic reaction, a tumor, or an infection. If your headache is caused by a sinus blockage, such as an infection, you will probably have a fever. An x-ray will confirm a sinus blockage. Your caregiver's treatment might include antibiotics for the  infection, as well as antihistamines or decongestants.  REBOUND HEADACHES Q: What is a rebound headache? What causes it? How can I treat it? A: A pattern of taking acute headache medications too often can lead to a condition known as "rebound headache." A pattern of taking too much headache medication includes taking it more than 2 days per week or in excessive amounts. That means more than the label or a caregiver advises. With rebound headaches, your medications not only stop relieving pain, they actually begin to cause headaches. Doctors treat rebound headache by tapering the medication that is being overused. Sometimes your caregiver will gradually substitute a different type of treatment or medication. Stopping may be a challenge. Regularly overusing a medication increases the potential for serious side effects. Consult a caregiver if you regularly use headache medications more than 2 days per week or more than the label advises. ADDITIONAL QUESTIONS AND ANSWERS Q: What is biofeedback? A: Biofeedback is a self-help treatment. Biofeedback uses special equipment to monitor your body's involuntary physical responses. Biofeedback monitors:  Breathing.  Pulse.  Heart rate.  Temperature.  Muscle tension.  Brain activity. Biofeedback helps you refine and perfect your relaxation exercises. You learn to control the physical responses that are related to stress. Once the technique has been mastered, you do not need the equipment any more. Q: Are headaches hereditary? A: Four out of five (80%) of people that suffer report a family history of migraine. Scientists are not sure if this is genetic or a family predisposition. Despite the uncertainty, a child has a 50% chance of having migraine if one parent suffers. The child has a 75% chance if both parents suffer.  Q: Can children get headaches? A: By the time they reach high school, most young people have experienced some type of headache. Many safe  and effective approaches or medications can prevent a headache from occurring or stop it after it has begun.  Q: What type of doctor should I see to diagnose and treat my headache? A: Start with your primary caregiver. Discuss his or her experience and approach to headaches. Discuss methods of classification, diagnosis, and treatment. Your caregiver may decide to recommend you to a headache specialist, depending upon your symptoms or other physical conditions. Having diabetes, allergies, etc., may require a more comprehensive and inclusive approach to your headache. The National Headache Foundation will provide, upon request, a list of [facility] physician members in your state. Document Released: 09/03/2003 Document Revised: 09/05/2011 Document Reviewed: 02/11/2008 [facility] Patient Information 2015 [place], [place]. This information is not intended to replace advice given to you by your health care provider. Make sure you discuss any questions you have with your health care provider. Sinusitis Sinusitis is redness, soreness, and inflammation of the paranasal sinuses. Paranasal sinuses are air pockets within the bones of your face (beneath the eyes, the middle of the forehead, or above the eyes). In healthy paranasal sinuses, mucus is able to drain out, and air is able to circulate through them by way of your nose. However, when your paranasal sinuses are inflamed, mucus and air can become trapped. This can allow bacteria and other germs to grow and cause infection. Sinusitis can develop quickly and last only a short time (acute) or continue  over a long period (chronic). Sinusitis that lasts for more than 12 weeks is considered chronic.  CAUSES  Causes of sinusitis include:  Allergies.  Structural abnormalities, such as displacement of the cartilage that separates your nostrils (deviated septum), which can decrease the air flow through your nose and sinuses and affect sinus drainage.  Functional  abnormalities, such as when the small hairs (cilia) that line your sinuses and help remove mucus do not work properly or are not present. SIGNS AND SYMPTOMS  Symptoms of acute and chronic sinusitis are the same. The primary symptoms are pain and pressure around the affected sinuses. Other symptoms include:  Upper toothache.  Earache.  Headache.  Bad breath.  Decreased sense of smell and taste.  A cough, which worsens when you are lying flat.  Fatigue.  Fever.  Thick drainage from your nose, which often is green and may contain pus (purulent).  Swelling and warmth over the affected sinuses. DIAGNOSIS  Your health care provider will perform a physical exam. During the exam, your health care provider may:  Look in your nose for signs of abnormal growths in your nostrils (nasal polyps).  Tap over the affected sinus to check for signs of infection.  View the inside of your sinuses (endoscopy) using an imaging device that has a light attached (endoscope). If your health care provider suspects that you have chronic sinusitis, one or more of the following tests may be recommended:  Allergy tests.  Nasal culture. A sample of mucus is taken from your nose, sent to a lab, and screened for bacteria.  Nasal cytology. A sample of mucus is taken from your nose and examined by your health care provider to determine if your sinusitis is related to an allergy. TREATMENT  Most cases of acute sinusitis are related to a viral infection and will resolve on their own within 10 days. Sometimes medicines are prescribed to help relieve symptoms (pain medicine, decongestants, nasal steroid sprays, or saline sprays).  However, for sinusitis related to a bacterial infection, your health care provider will prescribe antibiotic medicines. These are medicines that will help kill the bacteria causing the infection.  Rarely, sinusitis is caused by a fungal infection. In theses cases, your health care provider  will prescribe antifungal medicine. For some cases of chronic sinusitis, surgery is needed. Generally, these are cases in which sinusitis recurs more than 3 times per year, despite other treatments. HOME CARE INSTRUCTIONS   Drink plenty of water. Water helps thin the mucus so your sinuses can drain more easily.  Use a humidifier.  Inhale steam 3 to 4 times a day (for example, sit in the bathroom with the shower running).  Apply a warm, moist washcloth to your face 3 to 4 times a day, or as directed by your health care provider.  Use saline nasal sprays to help moisten and clean your sinuses.  Take medicines only as directed by your health care provider.  If you were prescribed either an antibiotic or antifungal medicine, finish it all even if you start to feel better. SEEK IMMEDIATE MEDICAL CARE IF:  You have increasing pain or severe headaches.  You have nausea, vomiting, or drowsiness.  You have swelling around your face.  You have vision problems.  You have a stiff neck.  You have difficulty breathing. MAKE SURE YOU:   Understand these instructions.  Will watch your condition.  Will get help right away if you are not doing well or get worse. Document Released: 06/13/2005  Document Revised: 10/28/2013 Document Reviewed: 06/28/2011 [facility] Patient Information 2015 [place], [place]. This information is not intended to replace advice given to you by your health care provider. Make sure you discuss any questions you have with your health care provider.

## 2014-07-13 NOTE — ED Provider Notes (Signed)
CSN: [id number]     Arrival date & time 07/13/14  1247 History   First MD Initiated Contact with Patient 07/13/14 1337     Chief Complaint  Patient presents with  . Headache  . Dizziness  . Sore Throat  . Rash     (Consider location/radiation/quality/duration/timing/severity/associated sxs/prior Treatment) HPI Comments: Pt comes in with cc of headaches. Seen at [facility] yday. Pt reports that she started having the worst headache of her life, different than her migraines, and associated nausea, dizziness. She also has neck pain. She was seen yday, and dx as viral syndrome. Pt reports that she went to sleep with a headache, and woke up with the headache, and it is not responding to ibuprofen. Pt's headache are frontal, moving to the back and neck. She also has a sore throat. + cough.  Patient is a 34 y.o. female presenting with headaches, dizziness, pharyngitis, and rash. The history is provided by the patient.  Headache Associated symptoms: dizziness and neck pain   Associated symptoms: no abdominal pain, no cough, no diarrhea, no nausea, no neck stiffness and no vomiting   Dizziness Associated symptoms: headaches   Associated symptoms: no blood in stool, no chest pain, no diarrhea, no nausea, no shortness of breath and no vomiting   Sore Throat Associated symptoms include headaches. Pertinent negatives include no chest pain, no abdominal pain and no shortness of breath.  Rash Associated symptoms: headaches   Associated symptoms: no abdominal pain, no diarrhea, no nausea, no shortness of breath, not vomiting and not wheezing     Past Medical History  Diagnosis Date  . Headache(784.0)     sinus  . GERD (gastroesophageal reflux disease)   . Kidney stones     no current problem  . Anxiety   . Deviated septum 09/2011  . 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  . Complication of anesthesia     states small mouth  . Pelvic inflammatory disease    Past Surgical History  Procedure  Laterality Date  . Polyps removed      colon  . Wisdom tooth extraction    . Nasal septoplasty w/ turbinoplasty  10/04/2011    Procedure: NASAL SEPTOPLASTY WITH TURBINATE REDUCTION;  Surgeon: Ascencion Dike, MD;  Location: [place];  Service: ENT;  Laterality: Bilateral;  . Tonsillectomy     Family History  Problem Relation Age of Onset  . Cancer Mother   . Hypertension Mother   . Hypertension Father   . Cancer Brother   . Hypertension Other    History  Substance Use Topics  . Smoking status: Former Smoker -- 0.25 packs/day  . Smokeless tobacco: Never Used  . Alcohol Use: No   OB History    Gravida Para Term Preterm AB TAB SAB Ectopic Multiple Living   4 4 4       4      Review of Systems  Constitutional: Negative for activity change.  HENT: Negative for facial swelling.   Respiratory: Negative for cough, shortness of breath and wheezing.   Cardiovascular: Negative for chest pain.  Gastrointestinal: Negative for nausea, vomiting, abdominal pain, diarrhea, constipation, blood in stool and abdominal distention.  Genitourinary: Negative for dysuria, hematuria and difficulty urinating.  Musculoskeletal: Positive for neck pain. Negative for neck stiffness.  Skin: Negative for color change and rash.  Neurological: Positive for dizziness and headaches. Negative for speech difficulty.  Hematological: Does not bruise/bleed easily.  Psychiatric/Behavioral: Negative for confusion.  Allergies  Sulfa antibiotics  Home Medications   Prior to Admission medications   Medication Sig Start Date End Date Taking? Authorizing Provider  clonazePAM (KLONOPIN) 1 MG tablet Take 1 mg by mouth daily.    Yes Historical Provider, MD  levonorgestrel-ethinyl estradiol (AUBRA) 0.1-20 MG-MCG tablet Take 1 tablet by mouth daily.   Yes Historical Provider, MD  naproxen (NAPROSYN) 500 MG tablet Take 1 tablet (500 mg total) by mouth 2 (two) times daily. 07/12/14  Yes Fredia Sorrow, MD   omeprazole (PRILOSEC) 20 MG capsule Take 1 capsule (20 mg total) by mouth daily. 04/01/14  Yes Carrie Mew, PA-C  ondansetron (ZOFRAN ODT) 4 MG disintegrating tablet Take 1 tablet (4 mg total) by mouth every 8 (eight) hours as needed for nausea or vomiting. 07/12/14  Yes Fredia Sorrow, MD  ciprofloxacin (CIPRO) 500 MG tablet Take 1 tablet (500 mg total) by mouth every 12 (twelve) hours. Patient not taking: Reported on 07/13/2014 05/08/14   Tanna Furry, MD  fexofenadine-pseudoephedrine (ALLEGRA-D) 60-120 MG per tablet Take 1 tablet by mouth every 12 (twelve) hours. 07/13/14   Varney Biles, MD  HYDROcodone-acetaminophen (NORCO/VICODIN) 5-325 MG per tablet Take 2 tablets by mouth every 4 (four) hours as needed. Patient not taking: Reported on 07/13/2014 05/08/14   Tanna Furry, MD  ibuprofen (ADVIL,MOTRIN) 400 MG tablet Take 1 tablet (400 mg total) by mouth every 6 (six) hours as needed. 07/13/14   Varney Biles, MD  traMADol (ULTRAM) 50 MG tablet Take 1 tablet (50 mg total) by mouth every 6 (six) hours as needed. 07/13/14   Eames Dibiasio Kathrynn Humble, MD   BP 98/64 mmHg  Pulse 85  Temp(Src) 98.9 F (37.2 C) (Oral)  Resp 18  Ht 5\' 2"  (1.575 m)  SpO2 99% Physical Exam  Constitutional: She is oriented to person, place, and time. She appears well-developed and well-nourished.  HENT:  Head: Normocephalic and atraumatic.  Eyes: EOM are normal. Pupils are equal, round, and reactive to light.  Neck: Neck supple.  Cardiovascular: Normal rate, regular rhythm and normal heart sounds.   No murmur heard. Pulmonary/Chest: Effort normal. No respiratory distress.  Abdominal: Soft. She exhibits no distension. There is no tenderness. There is no rebound and no guarding.  Neurological: She is alert and oriented to person, place, and time. No cranial nerve deficit. Coordination normal.  Skin: Skin is warm and dry.  Nursing note and vitals reviewed.   ED Course  Procedures (including critical care time) Labs  Review Labs Reviewed  CBC WITH DIFFERENTIAL  BASIC METABOLIC PANEL  TROPONIN I    Imaging Review Dg Chest 2 View  07/12/2014   CLINICAL DATA:  Acute onset of generalized chest pain. Shortness of breath, dizziness, nausea and near syncope. Initial encounter.  EXAM: CHEST  2 VIEW  COMPARISON:  Chest radiograph from 06/26/2014  FINDINGS: The lungs are well-aerated and clear. There is no evidence of focal opacification, pleural effusion or pneumothorax.  The heart is borderline normal in size; the mediastinal contour is within normal limits. No acute osseous abnormalities are seen.  IMPRESSION: No acute cardiopulmonary process seen.   Electronically Signed   By: Garald Balding M.D.   On: 07/12/2014 23:45   Ct Head Wo Contrast  07/13/2014   CLINICAL DATA:  Continued headache with dizziness 2 days. Seen for the same at [facility] yesterday. Nurse note: Pt states that she was seen at [facility] last night for same symptoms but states that headache has been continuous as well as  dizziness despite the medications they have given her. Pt adding that her throat is sore and rash appeared on her face starting today.  EXAM: CT HEAD WITHOUT CONTRAST  TECHNIQUE: Contiguous axial images were obtained from the base of the skull through the vertex without intravenous contrast.  COMPARISON:  01/20/2013  FINDINGS: Normal appearing cerebral hemispheres and posterior fossa structures. Normal size and position of the ventricles. No intracranial hemorrhage, mass lesion or CT evidence of acute infarction. Unremarkable bones and included paranasal sinuses.  IMPRESSION: Normal examination.   Electronically Signed   By: Enrique Sack M.D.   On: 07/13/2014 15:00     EKG Interpretation None      4:26 PM Headache resolved. Will d.c.  MDM   Final diagnoses:  Headache  Acute frontal sinusitis, recurrence not specified  Viral syndrome   Pt comes in w/ cc of headaches. Pt describes the headache as the "worst headache of her  life." and the h/a is different and more severe than her migraine blood. Pt also has neck pain. She has no dx for the SAH/brain bleeds  -however, this is repeat visit for her, and she reports that the h/a is constant, severe. No CT head y'day, otherwise, we would have effectively ruled out [facility].    Varney Biles, MD 07/13/14 [phone]

## 2014-07-13 NOTE — ED Notes (Signed)
Given first dose of meds prescribed per protocol, given crackers. Given Rx x2, alert, NAD, calm, steady gait, declined w/c, out with family. VSS.  Given work note. Denies needs sx, questions or concerns unmet.

## 2014-07-30 ENCOUNTER — Emergency Department (INDEPENDENT_AMBULATORY_CARE_PROVIDER_SITE_OTHER)
Admission: EM | Admit: 2014-07-30 | Discharge: 2014-07-30 | Disposition: A | Discharge status: Home / Self Care | Payer: No Typology Code available for payment source | Source: Home / Self Care | Attending: Emergency Medicine | Admitting: Emergency Medicine

## 2014-07-30 ENCOUNTER — Encounter (HOSPITAL_COMMUNITY): Payer: Self-pay | Admitting: *Deleted

## 2014-07-30 ENCOUNTER — Emergency Department (INDEPENDENT_AMBULATORY_CARE_PROVIDER_SITE_OTHER): Payer: No Typology Code available for payment source

## 2014-07-30 DIAGNOSIS — R350 Frequency of micturition: Secondary | ICD-10-CM

## 2014-07-30 DIAGNOSIS — R05 Cough: Secondary | ICD-10-CM

## 2014-07-30 DIAGNOSIS — R059 Cough, unspecified: Secondary | ICD-10-CM

## 2014-07-30 DIAGNOSIS — J019 Acute sinusitis, unspecified: Secondary | ICD-10-CM

## 2014-07-30 DIAGNOSIS — J209 Acute bronchitis, unspecified: Secondary | ICD-10-CM

## 2014-07-30 DIAGNOSIS — R109 Unspecified abdominal pain: Secondary | ICD-10-CM

## 2014-07-30 LAB — CBC WITH DIFFERENTIAL/PLATELET
Basophils Absolute: 0.1 10*3/uL (ref 0.0–0.1)
Basophils Relative: 1 % (ref 0–1)
EOS PCT: 5 % (ref 0–5)
Eosinophils Absolute: 0.3 10*3/uL (ref 0.0–0.7)
HEMATOCRIT: 39.1 % (ref 36.0–46.0)
Hemoglobin: 13.1 g/dL (ref 12.0–15.0)
Lymphocytes Relative: 20 % (ref 12–46)
Lymphs Abs: 1.4 10*3/uL (ref 0.7–4.0)
MCH: 29.6 pg (ref 26.0–34.0)
MCHC: 33.5 g/dL (ref 30.0–36.0)
MCV: 88.5 fL (ref 78.0–100.0)
Monocytes Absolute: 0.6 10*3/uL (ref 0.1–1.0)
Monocytes Relative: 9 % (ref 3–12)
NEUTROS PCT: 65 % (ref 43–77)
Neutro Abs: 4.3 10*3/uL (ref 1.7–7.7)
PLATELETS: 293 10*3/uL (ref 150–400)
RBC: 4.42 MIL/uL (ref 3.87–5.11)
RDW: 12.4 % (ref 11.5–15.5)
WBC: 6.6 10*3/uL (ref 4.0–10.5)

## 2014-07-30 LAB — POCT URINALYSIS DIP (DEVICE)
Bilirubin Urine: NEGATIVE
Glucose, UA: NEGATIVE mg/dL
Ketones, ur: NEGATIVE mg/dL
Nitrite: NEGATIVE
Protein, ur: NEGATIVE mg/dL
SPECIFIC GRAVITY, URINE: 1.02 (ref 1.005–1.030)
UROBILINOGEN UA: 0.2 mg/dL (ref 0.0–1.0)
pH: 7 (ref 5.0–8.0)

## 2014-07-30 LAB — POCT I-STAT, CHEM 8
BUN: 7 mg/dL (ref 6–23)
CHLORIDE: 102 mmol/L (ref 96–112)
Calcium, Ion: 1.19 mmol/L (ref 1.12–1.23)
Creatinine, Ser: 0.7 mg/dL (ref 0.50–1.10)
Glucose, Bld: 98 mg/dL (ref 70–99)
HEMATOCRIT: 41 % (ref 36.0–46.0)
HEMOGLOBIN: 13.9 g/dL (ref 12.0–15.0)
POTASSIUM: 3.7 mmol/L (ref 3.5–5.1)
SODIUM: 140 mmol/L (ref 135–145)
TCO2: 23 mmol/L (ref 0–100)

## 2014-07-30 LAB — POCT PREGNANCY, URINE: Preg Test, Ur: NEGATIVE

## 2014-07-30 MED ORDER — DOXYCYCLINE HYCLATE 100 MG PO TABS: 100.0000 mg | ORAL_TABLET | Freq: Two times a day (BID) | ORAL | Status: DC

## 2014-07-30 MED ORDER — FLUTICASONE PROPIONATE 50 MCG/ACT NA SUSP: 2.0000 | Freq: Every day | NASAL | Status: DC

## 2014-07-30 MED ORDER — FEXOFENADINE-PSEUDOEPHED ER 60-120 MG PO TB12: 1.0000 | ORAL_TABLET | Freq: Two times a day (BID) | ORAL | Status: DC

## 2014-07-30 MED ORDER — ALBUTEROL SULFATE HFA 108 (90 BASE) MCG/ACT IN AERS: 1.0000 | INHALATION_SPRAY | Freq: Four times a day (QID) | RESPIRATORY_TRACT | Status: DC | PRN

## 2014-07-30 MED ORDER — PREDNISONE 20 MG PO TABS: ORAL_TABLET | ORAL | Status: DC

## 2014-07-30 NOTE — ED Notes (Signed)
Pt      Reports    symptoms   Of  Cough  /   Congested       With      Nasal  Congestion   /  Body  Aches    Frequent  bm  But  No  Diarrhea          Seen  sev  Times  For   Similar  Symptoms

## 2014-07-30 NOTE — Discharge Instructions (Signed)
Acute Bronchitis Bronchitis is inflammation of the airways that extend from the windpipe into the lungs (bronchi). The inflammation often causes mucus to develop. This leads to a cough, which is the most common symptom of bronchitis.  In acute bronchitis, the condition usually develops suddenly and goes away over time, usually in a couple weeks. Smoking, allergies, and asthma can make bronchitis worse. Repeated episodes of bronchitis may cause further lung problems.  CAUSES Acute bronchitis is most often caused by the same virus that causes a cold. The virus can spread from person to person (contagious) through coughing, sneezing, and touching contaminated objects. SIGNS AND SYMPTOMS   Cough.   Fever.   Coughing up mucus.   Body aches.   Chest congestion.   Chills.   Shortness of breath.   Sore throat.  DIAGNOSIS  Acute bronchitis is usually diagnosed through a physical exam. Your health care provider will also ask you questions about your medical history. Tests, such as chest X-rays, are sometimes done to rule out other conditions.  TREATMENT  Acute bronchitis usually goes away in a couple weeks. Oftentimes, no medical treatment is necessary. Medicines are sometimes given for relief of fever or cough. Antibiotic medicines are usually not needed but may be prescribed in certain situations. In some cases, an inhaler may be recommended to help reduce shortness of breath and control the cough. A cool mist vaporizer may also be used to help thin bronchial secretions and make it easier to clear the chest.  HOME CARE INSTRUCTIONS  Get plenty of rest.   Drink enough fluids to keep your urine clear or pale yellow (unless you have a medical condition that requires fluid restriction). Increasing fluids may help thin your respiratory secretions (sputum) and reduce chest congestion, and it will prevent dehydration.   Take medicines only as directed by your health care provider.  If  you were prescribed an antibiotic medicine, finish it all even if you start to feel better.  Avoid smoking and secondhand smoke. Exposure to cigarette smoke or irritating chemicals will make bronchitis worse. If you are a smoker, consider using nicotine gum or skin patches to help control withdrawal symptoms. Quitting smoking will help your lungs heal faster.   Reduce the chances of another bout of acute bronchitis by washing your hands frequently, avoiding people with cold symptoms, and trying not to touch your hands to your mouth, nose, or eyes.   Keep all follow-up visits as directed by your health care provider.  SEEK MEDICAL CARE IF: Your symptoms do not improve after 1 week of treatment.  SEEK IMMEDIATE MEDICAL CARE IF:  You develop an increased fever or chills.   You have chest pain.   You have severe shortness of breath.  You have bloody sputum.   You develop dehydration.  You faint or repeatedly feel like you are going to pass out.  You develop repeated vomiting.  You develop a severe headache. MAKE SURE YOU:   Understand these instructions.  Will watch your condition.  Will get help right away if you are not doing well or get worse. Document Released: 07/21/2004 Document Revised: 10/28/2013 Document Reviewed: 12/04/2012 Exit[employer] Patient Information 2015 [employer]. This information is not intended to replace advice given to you by your health care provider. Make sure you discuss any questions you have with your health care provider. Sinusitis Sinusitis is redness, soreness, and inflammation of the paranasal sinuses. Paranasal sinuses are air pockets within the bones of your face (beneath the   eyes, the middle of the forehead, or above the eyes). In healthy paranasal sinuses, mucus is able to drain out, and air is able to circulate through them by way of your nose. However, when your paranasal sinuses are inflamed, mucus and air can become trapped. This can  allow bacteria and other germs to grow and cause infection. Sinusitis can develop quickly and last only a short time (acute) or continue over a long period (chronic). Sinusitis that lasts for more than 12 weeks is considered chronic.  CAUSES  Causes of sinusitis include:  Allergies.  Structural abnormalities, such as displacement of the cartilage that separates your nostrils (deviated septum), which can decrease the air flow through your nose and sinuses and affect sinus drainage.  Functional abnormalities, such as when the small hairs (cilia) that line your sinuses and help remove mucus do not work properly or are not present. SIGNS AND SYMPTOMS  Symptoms of acute and chronic sinusitis are the same. The primary symptoms are pain and pressure around the affected sinuses. Other symptoms include:  Upper toothache.  Earache.  Headache.  Bad breath.  Decreased sense of smell and taste.  A cough, which worsens when you are lying flat.  Fatigue.  Fever.  Thick drainage from your nose, which often is green and may contain pus (purulent).  Swelling and warmth over the affected sinuses. DIAGNOSIS  Your health care provider will perform a physical exam. During the exam, your health care provider may:  Look in your nose for signs of abnormal growths in your nostrils (nasal polyps).  Tap over the affected sinus to check for signs of infection.  View the inside of your sinuses (endoscopy) using an imaging device that has a light attached (endoscope). If your health care provider suspects that you have chronic sinusitis, one or more of the following tests may be recommended:  Allergy tests.  Nasal culture. A sample of mucus is taken from your nose, sent to a lab, and screened for bacteria.  Nasal cytology. A sample of mucus is taken from your nose and examined by your health care provider to determine if your sinusitis is related to an allergy. TREATMENT  Most cases of acute  sinusitis are related to a viral infection and will resolve on their own within 10 days. Sometimes medicines are prescribed to help relieve symptoms (pain medicine, decongestants, nasal steroid sprays, or saline sprays).  However, for sinusitis related to a bacterial infection, your health care provider will prescribe antibiotic medicines. These are medicines that will help kill the bacteria causing the infection.  Rarely, sinusitis is caused by a fungal infection. In theses cases, your health care provider will prescribe antifungal medicine. For some cases of chronic sinusitis, surgery is needed. Generally, these are cases in which sinusitis recurs more than 3 times per year, despite other treatments. HOME CARE INSTRUCTIONS   Drink plenty of water. Water helps thin the mucus so your sinuses can drain more easily.  Use a humidifier.  Inhale steam 3 to 4 times a day (for example, sit in the bathroom with the shower running).  Apply a warm, moist washcloth to your face 3 to 4 times a day, or as directed by your health care provider.  Use saline nasal sprays to help moisten and clean your sinuses.  Take medicines only as directed by your health care provider.  If you were prescribed either an antibiotic or antifungal medicine, finish it all even if you start to feel better. SEEK IMMEDIATE MEDICAL   CARE IF:  You have increasing pain or severe headaches.  You have nausea, vomiting, or drowsiness.  You have swelling around your face.  You have vision problems.  You have a stiff neck.  You have difficulty breathing. MAKE SURE YOU:   Understand these instructions.  Will watch your condition.  Will get help right away if you are not doing well or get worse. Document Released: 06/13/2005 Document Revised: 10/28/2013 Document Reviewed: 06/28/2011 Exit[employer] Patient Information 2015 [employer]. This information is not intended to replace advice given to you by your health care provider.  Make sure you discuss any questions you have with your health care provider.  

## 2014-07-30 NOTE — ED Provider Notes (Signed)
Chief Complaint   Cough   History of Present Illness   Christina Fox is a 34 year old female who comes in today with a chief complaint cough, but she's had multiple other symptoms going on off and on since mid December. She's been to the emergency room or urgent care 3 times prior to this for the same symptoms. Workup has been negative thus far, with normal chest x-rays and blood work. Right now her symptom picture consists of headache, sinus pressure, nasal congestion, left ear congestion, and aching behind her eyes. She's also had cough productive green sputum, chest tightness, aching in her chest. She denies any fever, chills, or stiff neck. There is no hemoptysis. She has no history of asthma or allergies. She also today mention some other symptoms including frequent urination but no dysuria, urgency, hematuria, and frequent, but not diarrheal bowel movements without any blood or mucus. Patient states her stools are unusual colors including yellow and green. She also has had bilateral flank pain, nausea, and dizziness.  Review of Systems   Other than as noted above, the patient denies any of the following symptoms: Systemic:  No fevers, chills, sweats, or weight loss. ENT:  No nasal congestion, sneezing, itching, postnasal drip, sinus pressure, headache, sore throat, or hoarseness. Lungs:  No wheezing, shortness of breath, chest tightness or congestion. Heart:  No chest pain, tightness, pressure, PND, orthopnea, or ankle edema. GI:  No indigestion, heartburn, waterbrash, burping, abdominal pain, nausea, or vomiting.  [place]   Past medical history, family history, social history, meds, and allergies were reviewed. She is allergic to sulfa. She takes clonazepam and birth control pills. She has chronic sinus problems and anxiety  Physical Examination     Vital signs:  BP 139/85 mmHg  Pulse 89  Temp(Src) 98.2 F (36.8 C) (Oral)  Resp 16  SpO2 99% General:  Alert and oriented.  In  no distress.  Skin warm and dry. ENT: TMs and ear canals normal.  Nasal mucosa normal, without drainage.  Pharynx clear without exudate or drainage.  No intraoral lesions. Neck:  No adenopathy, tenderness or mass.  No JVD. Lungs:  No respiratory distress.  Breath sounds clear and equal bilaterally.  No wheezes, rales or rhonchi. Heart:  Regular rhythm, no gallops or murmers.  No pedal edema. Abdomon:  She has mild, generalized tenderness to palpation without guarding or rebound. No organomegaly or mass. Bowel sounds were normally active.  Radiology   Dg Chest 2 View  07/30/2014   CLINICAL DATA:  Bronchitis diagnosis last month with persistent symptoms. Productive cough. No fever. Subsequent encounter.  EXAM: CHEST  2 VIEW  COMPARISON:  Radiographs 07/12/2014 and 06/26/2014.  FINDINGS: The heart size and mediastinal contours are normal. The lungs are clear. There is no pleural effusion or pneumothorax. No acute osseous findings are identified.  IMPRESSION: Stable examination.  No active cardiopulmonary process.   Electronically Signed   By: Camie Patience M.D.   On: 07/30/2014 11:37   Assessment   The primary encounter diagnosis was Acute bronchitis, unspecified organism. Diagnoses of Cough, Acute sinusitis, recurrence not specified, unspecified location, Urinary frequency, and Bilateral flank pain were also pertinent to this visit.  Chronic cough due to reactive airways disease versus sinusitis or allergies and postnasal drip. She's had one course of azithromycin and some steroids as well as some cough syrup. Will try a round of doxycycline, a steroid taper, and albuterol. If no improvement after 10 days she's to return here for  recheck. No evidence of an acute abdomen or UTI and I did obtain a urine culture.  Plan     1.  Meds:  The following meds were prescribed:   New Prescriptions   ALBUTEROL (PROVENTIL HFA;VENTOLIN HFA) 108 (90 BASE) MCG/ACT INHALER    Inhale 1-2 puffs into the lungs every 6  (six) hours as needed for wheezing or shortness of breath.   DOXYCYCLINE (VIBRA-TABS) 100 MG TABLET    Take 1 tablet (100 mg total) by mouth 2 (two) times daily.   FLUTICASONE (FLONASE) 50 MCG/ACT NASAL SPRAY    Place 2 sprays into both nostrils daily.   PREDNISONE (DELTASONE) 20 MG TABLET    Take 3 daily for 5 days, 2 daily for 5 days, 1 daily for 5 days.    2.  Patient Education/Counseling:  The patient was given appropriate handouts, self care instructions, and instructed in symptomatic relief.    3.  Follow up:  The patient was told to follow up here if no better in 3 to 4 days, or sooner if becoming worse in any way, and given some red flag symptoms such as difficulty breathing or chest pain which would prompt immediate return.         Harden Mo, MD 07/30/14 [phone]

## 2014-07-31 ENCOUNTER — Encounter (HOSPITAL_COMMUNITY): Payer: Self-pay | Admitting: *Deleted

## 2014-07-31 ENCOUNTER — Emergency Department (HOSPITAL_COMMUNITY): Payer: No Typology Code available for payment source

## 2014-07-31 ENCOUNTER — Encounter (HOSPITAL_COMMUNITY): Payer: Self-pay | Admitting: Emergency Medicine

## 2014-07-31 ENCOUNTER — Emergency Department (HOSPITAL_COMMUNITY)
Admission: EM | Admit: 2014-07-31 | Discharge: 2014-08-01 | Disposition: A | Discharge status: Home / Self Care | Payer: No Typology Code available for payment source | Attending: Emergency Medicine | Admitting: Emergency Medicine

## 2014-07-31 ENCOUNTER — Emergency Department (INDEPENDENT_AMBULATORY_CARE_PROVIDER_SITE_OTHER)
Admission: EM | Admit: 2014-07-31 | Discharge: 2014-07-31 | Disposition: A | Discharge status: Nursing Home (Includes ICF) | Payer: No Typology Code available for payment source | Source: Home / Self Care | Attending: Emergency Medicine | Admitting: Emergency Medicine

## 2014-07-31 DIAGNOSIS — Z7951 Long term (current) use of inhaled steroids: Secondary | ICD-10-CM | POA: Insufficient documentation

## 2014-07-31 DIAGNOSIS — Z79899 Other long term (current) drug therapy: Secondary | ICD-10-CM | POA: Insufficient documentation

## 2014-07-31 DIAGNOSIS — F419 Anxiety disorder, unspecified: Secondary | ICD-10-CM | POA: Insufficient documentation

## 2014-07-31 DIAGNOSIS — R102 Pelvic and perineal pain: Secondary | ICD-10-CM

## 2014-07-31 DIAGNOSIS — N760 Acute vaginitis: Secondary | ICD-10-CM

## 2014-07-31 DIAGNOSIS — N739 Female pelvic inflammatory disease, unspecified: Secondary | ICD-10-CM

## 2014-07-31 DIAGNOSIS — R109 Unspecified abdominal pain: Secondary | ICD-10-CM

## 2014-07-31 DIAGNOSIS — Z3202 Encounter for pregnancy test, result negative: Secondary | ICD-10-CM | POA: Insufficient documentation

## 2014-07-31 DIAGNOSIS — R1030 Lower abdominal pain, unspecified: Secondary | ICD-10-CM

## 2014-07-31 DIAGNOSIS — B9689 Other specified bacterial agents as the cause of diseases classified elsewhere: Secondary | ICD-10-CM

## 2014-07-31 DIAGNOSIS — Z87891 Personal history of nicotine dependence: Secondary | ICD-10-CM | POA: Insufficient documentation

## 2014-07-31 DIAGNOSIS — K219 Gastro-esophageal reflux disease without esophagitis: Secondary | ICD-10-CM | POA: Insufficient documentation

## 2014-07-31 DIAGNOSIS — Z8709 Personal history of other diseases of the respiratory system: Secondary | ICD-10-CM | POA: Insufficient documentation

## 2014-07-31 LAB — URINALYSIS, ROUTINE W REFLEX MICROSCOPIC
BILIRUBIN URINE: NEGATIVE
Glucose, UA: NEGATIVE mg/dL
Hgb urine dipstick: NEGATIVE
Ketones, ur: NEGATIVE mg/dL
Leukocytes, UA: NEGATIVE
Nitrite: NEGATIVE
PH: 6.5 (ref 5.0–8.0)
PROTEIN: NEGATIVE mg/dL
Specific Gravity, Urine: 1.021 (ref 1.005–1.030)
UROBILINOGEN UA: 0.2 mg/dL (ref 0.0–1.0)

## 2014-07-31 LAB — URINE CULTURE
Colony Count: 30000
SPECIAL REQUESTS: NORMAL

## 2014-07-31 LAB — COMPREHENSIVE METABOLIC PANEL
ALK PHOS: 54 U/L (ref 39–117)
ALT: 18 U/L (ref 0–35)
AST: 21 U/L (ref 0–37)
Albumin: 3.8 g/dL (ref 3.5–5.2)
Anion gap: 9 (ref 5–15)
BILIRUBIN TOTAL: 0.3 mg/dL (ref 0.3–1.2)
BUN: 11 mg/dL (ref 6–23)
CHLORIDE: 106 mmol/L (ref 96–112)
CO2: 23 mmol/L (ref 19–32)
CREATININE: 0.76 mg/dL (ref 0.50–1.10)
Calcium: 9.2 mg/dL (ref 8.4–10.5)
GFR calc Af Amer: >90 mL/min (ref >90)
GFR calc non Af Amer: >90 mL/min (ref >90)
Glucose, Bld: 140 mg/dL — ABNORMAL HIGH (ref 70–99)
Potassium: 3.9 mmol/L (ref 3.5–5.1)
SODIUM: 138 mmol/L (ref 135–145)
TOTAL PROTEIN: 7.2 g/dL (ref 6.0–8.3)

## 2014-07-31 LAB — WET PREP, GENITAL
Trich, Wet Prep: NONE SEEN
Yeast Wet Prep HPF POC: NONE SEEN

## 2014-07-31 LAB — CBC WITH DIFFERENTIAL/PLATELET
Basophils Absolute: 0 10*3/uL (ref 0.0–0.1)
Basophils Relative: 0 % (ref 0–1)
Eosinophils Absolute: 0 10*3/uL (ref 0.0–0.7)
Eosinophils Relative: 0 % (ref 0–5)
HCT: 38.5 % (ref 36.0–46.0)
HEMOGLOBIN: 12.9 g/dL (ref 12.0–15.0)
Lymphocytes Relative: 6 % — ABNORMAL LOW (ref 12–46)
Lymphs Abs: 0.7 10*3/uL (ref 0.7–4.0)
MCH: 29.9 pg (ref 26.0–34.0)
MCHC: 33.5 g/dL (ref 30.0–36.0)
MCV: 89.1 fL (ref 78.0–100.0)
MONO ABS: 0.3 10*3/uL (ref 0.1–1.0)
MONOS PCT: 3 % (ref 3–12)
NEUTROS ABS: 11 10*3/uL — AB (ref 1.7–7.7)
NEUTROS PCT: 91 % — AB (ref 43–77)
Platelets: 317 10*3/uL (ref 150–400)
RBC: 4.32 MIL/uL (ref 3.87–5.11)
RDW: 12.5 % (ref 11.5–15.5)
WBC: 12.1 10*3/uL — ABNORMAL HIGH (ref 4.0–10.5)

## 2014-07-31 LAB — POC URINE PREG, ED: PREG TEST UR: NEGATIVE

## 2014-07-31 MED ORDER — ONDANSETRON HCL 4 MG/2ML IJ SOLN
4.0000 mg | Freq: Once | INTRAMUSCULAR | Status: AC
  Administered 2014-07-31: 4 mg via INTRAMUSCULAR

## 2014-07-31 MED ORDER — DOXYCYCLINE HYCLATE 100 MG PO CAPS: 100.0000 mg | ORAL_CAPSULE | Freq: Two times a day (BID) | ORAL | Status: DC

## 2014-07-31 MED ORDER — ONDANSETRON HCL 4 MG/2ML IJ SOLN
INTRAMUSCULAR | Status: AC
  Filled 2014-07-31: qty 2

## 2014-07-31 MED ORDER — AZITHROMYCIN 250 MG PO TABS
1000.0000 mg | ORAL_TABLET | Freq: Once | ORAL | Status: AC
  Administered 2014-07-31: 1000 mg via ORAL
  Filled 2014-07-31: qty 4

## 2014-07-31 MED ORDER — HYDROMORPHONE HCL 1 MG/ML IJ SOLN
INTRAMUSCULAR | Status: AC
  Filled 2014-07-31: qty 1

## 2014-07-31 MED ORDER — CEFTRIAXONE SODIUM 250 MG IJ SOLR
250.0000 mg | Freq: Once | INTRAMUSCULAR | Status: AC
  Administered 2014-07-31: 250 mg via INTRAMUSCULAR
  Filled 2014-07-31: qty 250

## 2014-07-31 MED ORDER — LIDOCAINE HCL (PF) 1 % IJ SOLN
INTRAMUSCULAR | Status: AC
  Administered 2014-07-31: 5 mL
  Filled 2014-07-31: qty 5

## 2014-07-31 MED ORDER — HYDROMORPHONE HCL 1 MG/ML IJ SOLN
1.0000 mg | Freq: Once | INTRAMUSCULAR | Status: AC
  Administered 2014-07-31: 1 mg via INTRAMUSCULAR

## 2014-07-31 MED ORDER — METRONIDAZOLE 500 MG PO TABS: 500.0000 mg | ORAL_TABLET | Freq: Two times a day (BID) | ORAL | Status: DC

## 2014-07-31 NOTE — ED Provider Notes (Signed)
Chief Complaint   Abdominal Pain   History of Present Illness   Christina Fox is a 34 year old female who presents today with a history of gradual onset lower abdominal pain radiating through to her back and down her legs that began around 2 PM today. There was no obvious precipitating factor. The pain is rated 10 over 10 in intensity. The patient states she's had similar episodes of pain about once a month for the past 4 years. The patient states she's been over to [facility] hospital M each ear monthly, although our records only show one visit in the past year. This was in November. She had a CT scan of her abdomen at that time which was negative. The patient denies any fever, chills, nausea, vomiting, constipation, diarrhea, hematochezia, or melena. She's had some urinary frequency but no urgency, dysuria, blood in the urine. She denies any GYN symptoms.  She was seen here yesterday for some URI symptoms including cough that is been going on for about 2 months. She's been to the emergency room or urgent care about 4 times in the past 2 months for these symptoms. Yesterday chest x-ray was negative and urine was unremarkable. She was treated with doxycycline, prednisone, Flonase, and cough medicine. She states her URI symptoms are about the same. She was complaining of some pain in her bilateral flanks yesterday.  Review of Systems   Other than as noted above, the patient denies any of the following symptoms: Constitutional:  No fever, chills, weight loss or anorexia. Abdomen:  No nausea, vomiting, hematememesis, melena, diarrhea, or hematochezia. GU:  No dysuria, frequency, urgency, or hematuria. Gyn:  No vaginal discharge, itching, abnormal bleeding, dyspareunia, or pelvic pain.  [place]   Past medical history, family history, social history, meds, and allergies were reviewed.   Physical Exam     Vital signs:  BP 141/85 mmHg  Pulse 86  Temp(Src) 97.8 F (36.6 C) (Oral)  Resp 18   SpO2 96% Gen:  Alert, oriented, in no distress. Lungs:  Breath sounds clear and equal bilaterally.  No wheezes, rales or rhonchi. Heart:  Regular rhythm.  No gallops or murmers.   Abdomen:  Soft, flat, nondistended. No organomegaly or mass. Bowel sounds are normally active. There is tenderness to palpation across the entire lower abdomen without guarding or rebound. Skin:  Clear, warm and dry.  No rash.  Course in [facility]   The following medications were given:  Medications HYDROmorphone (DILAUDID) injection 1 mg     ondansetron (ZOFRAN) injection 4 mg    Assessment   The encounter diagnosis was Pelvic pain in female.  There is a broad differential diagnosis including appendicitis, diverticulitis, ovarian cyst or torsion, PID, urinary tract infection, kidney stone. She will need further evaluation in the emergency room.  Plan     The patient was transferred to the ED via shuttle in stable condition.  Medical Decision Making:  34 year old female has history since 2 p.m. Today of excruciating 10/10 lower abdominal pain radiating to legs.  No fever, nausea, vomiting, diarrhea, constipation, hematochezia, urinary or GYN symptoms.  Patient states she has had this same thing multiple times in past 4 years, about once a month.  Was evaluated in Nov. 2015 at [facility] with negative results.  On exam, her abdomen is soft with bilateral lower abdominal tenderness to palpation and normal bowel sounds.  She was seen here yesterday for URI symptoms going on for over 2 months and  given medication.  Differential diagnosis is broad and includes:  Appendicitis, diverticulitis, ovarian cyst or torsion, or PID.  Will need further evaluation.  We will give Dilaudid and Zofran.      Harden Mo, MD 07/31/14 [phone]

## 2014-07-31 NOTE — ED Provider Notes (Signed)
CSN: [id number]     Arrival date & time 07/31/14  1842 History   First MD Initiated Contact with Patient 07/31/14 2006     Chief Complaint  Patient presents with  . Abdominal Pain  . Back Pain     (Consider location/radiation/quality/duration/timing/severity/associated sxs/prior Treatment) HPI Comments: 34 year old female with a past medical history of GERD, kidney stones, anxiety, pelvic inflammatory disease and headaches presenting to the ED from urgent care for evaluation of lower abdominal pain. Patient reports around 2:00 PM today, she started to gradually develop 10/10 lower abdominal pain radiating towards her legs and low back. Pain has been constant since, relieved by dilaudid from [facility]. States she has slight vaginal discharge. Admits to nausea without vomiting. Denies fever, chills, constipation, diarrhea, increased urinary frequency, urgency, dysuria or hematuria. States her pain is worse than contractions. She is sexually active with one partner and is on birth control. She does not use protection. No history of abdominal surgeries.  Patient is a 34 y.o. female presenting with abdominal pain and back pain. The history is provided by the patient.  Abdominal Pain Associated symptoms: nausea and vaginal discharge   Back Pain Associated symptoms: abdominal pain     Past Medical History  Diagnosis Date  . Headache(784.0)     sinus  . GERD (gastroesophageal reflux disease)   . Kidney stones     no current problem  . Anxiety   . Deviated septum 09/2011  . 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  . Complication of anesthesia     states small mouth  . Pelvic inflammatory disease    Past Surgical History  Procedure Laterality Date  . Polyps removed      colon  . Wisdom tooth extraction    . Nasal septoplasty w/ turbinoplasty  10/04/2011    Procedure: NASAL SEPTOPLASTY WITH TURBINATE REDUCTION;  Surgeon: Ascencion Dike, MD;  Location: [place];  Service: ENT;   Laterality: Bilateral;  . Tonsillectomy     Family History  Problem Relation Age of Onset  . Cancer Mother   . Hypertension Mother   . Hypertension Father   . Liver disease Father   . Cancer Brother   . Hypertension Other    History  Substance Use Topics  . Smoking status: Former Smoker -- 0.25 packs/day  . Smokeless tobacco: Never Used  . Alcohol Use: No   OB History    Gravida Para Term Preterm AB TAB SAB Ectopic Multiple Living   4 4 4       4      Review of Systems  Gastrointestinal: Positive for nausea and abdominal pain.  Genitourinary: Positive for vaginal discharge.  Musculoskeletal: Positive for back pain.  All other systems reviewed and are negative.     Allergies  Sulfa antibiotics  Home Medications   Prior to Admission medications   Medication Sig Start Date End Date Taking? Authorizing Provider  albuterol (PROVENTIL HFA;VENTOLIN HFA) 108 (90 BASE) MCG/ACT inhaler Inhale 1-2 puffs into the lungs every 6 (six) hours as needed for wheezing or shortness of breath. 07/30/14   Harden Mo, MD  ciprofloxacin (CIPRO) 500 MG tablet Take 1 tablet (500 mg total) by mouth every 12 (twelve) hours. Patient not taking: Reported on 07/13/2014 05/08/14   Tanna Furry, MD  clonazePAM (KLONOPIN) 1 MG tablet Take 1 mg by mouth daily.     Historical Provider, MD  doxycycline (VIBRA-TABS) 100 MG tablet Take 1 tablet (100 mg total)  by mouth 2 (two) times daily. 07/30/14   Harden Mo, MD  doxycycline (VIBRAMYCIN) 100 MG capsule Take 1 capsule (100 mg total) by mouth 2 (two) times daily. One po bid x 7 days 07/31/14   Carman Ching, PA-C  fluticasone [facility]) 50 MCG/ACT nasal spray Place 2 sprays into both nostrils daily. 07/30/14   Harden Mo, MD  HYDROcodone-acetaminophen (NORCO/VICODIN) 5-325 MG per tablet Take 2 tablets by mouth every 4 (four) hours as needed. Patient not taking: Reported on 07/13/2014 05/08/14   Tanna Furry, MD  ibuprofen (ADVIL,MOTRIN) 400 MG tablet Take 1  tablet (400 mg total) by mouth every 6 (six) hours as needed. 07/13/14   Varney Biles, MD  levonorgestrel-ethinyl estradiol (AUBRA) 0.1-20 MG-MCG tablet Take 1 tablet by mouth daily.    Historical Provider, MD  metroNIDAZOLE (FLAGYL) 500 MG tablet Take 1 tablet (500 mg total) by mouth 2 (two) times daily. One po bid x 7 days 07/31/14   Carman Ching, PA-C  naproxen (NAPROSYN) 500 MG tablet Take 1 tablet (500 mg total) by mouth 2 (two) times daily. 07/12/14   Fredia Sorrow, MD  omeprazole (PRILOSEC) 20 MG capsule Take 1 capsule (20 mg total) by mouth daily. 04/01/14   Carrie Mew, PA-C  ondansetron (ZOFRAN ODT) 4 MG disintegrating tablet Take 1 tablet (4 mg total) by mouth every 8 (eight) hours as needed for nausea or vomiting. 07/12/14   Fredia Sorrow, MD  predniSONE (DELTASONE) 20 MG tablet Take 3 daily for 5 days, 2 daily for 5 days, 1 daily for 5 days. 07/30/14   Harden Mo, MD  traMADol (ULTRAM) 50 MG tablet Take 1 tablet (50 mg total) by mouth every 6 (six) hours as needed. 07/13/14   Ankit Nanavati, MD   BP 148/87 mmHg  Pulse 72  Temp(Src) 98.3 F (36.8 C) (Oral)  Resp 14  SpO2 98% Physical Exam  Constitutional: She is oriented to person, place, and time. She appears well-developed and well-nourished. No distress.  HENT:  Head: Normocephalic and atraumatic.  Mouth/Throat: Oropharynx is clear and moist.  Eyes: Conjunctivae are normal.  Neck: Normal range of motion. Neck supple.  Cardiovascular: Normal rate, regular rhythm and normal heart sounds.   Pulmonary/Chest: Effort normal and breath sounds normal.  Abdominal: Soft. Bowel sounds are normal. She exhibits no distension and no mass. There is no rebound and no guarding.  Mild TTP across lower abdomen. No peritoneal signs. No CVAT.  Genitourinary: Uterus is tender. Cervix exhibits motion tenderness and discharge. No bleeding in the vagina. Vaginal discharge (yellow) found.  Mild BL adnexal tenderness.  Musculoskeletal: Normal  range of motion. She exhibits no edema.  Neurological: She is alert and oriented to person, place, and time.  Skin: Skin is warm and dry. She is not diaphoretic.  Psychiatric: She has a normal mood and affect. Her behavior is normal.  Nursing note and vitals reviewed.   ED Course  Procedures (including critical care time) Labs Review Labs Reviewed  WET PREP, GENITAL - Abnormal; Notable for the following:    Clue Cells Wet Prep HPF POC MODERATE (*)    WBC, Wet Prep HPF POC MODERATE (*)    All other components within normal limits  CBC WITH DIFFERENTIAL/PLATELET - Abnormal; Notable for the following:    WBC 12.1 (*)    Neutrophils Relative % 91 (*)    Neutro Abs 11.0 (*)    Lymphocytes Relative 6 (*)    All other components within  normal limits  COMPREHENSIVE METABOLIC PANEL - Abnormal; Notable for the following:    Glucose, Bld 140 (*)    All other components within normal limits  URINALYSIS, ROUTINE W REFLEX MICROSCOPIC  POC URINE PREG, ED  GC/CHLAMYDIA PROBE AMP ([place])    Imaging Review Dg Chest 2 View  07/30/2014   CLINICAL DATA:  Bronchitis diagnosis last month with persistent symptoms. Productive cough. No fever. Subsequent encounter.  EXAM: CHEST  2 VIEW  COMPARISON:  Radiographs 07/12/2014 and 06/26/2014.  FINDINGS: The heart size and mediastinal contours are normal. The lungs are clear. There is no pleural effusion or pneumothorax. No acute osseous findings are identified.  IMPRESSION: Stable examination.  No active cardiopulmonary process.   Electronically Signed   By: Camie Patience M.D.   On: 07/30/2014 11:37   US Transvaginal Non-ob  07/31/2014   CLINICAL DATA:  Abdominal pain  EXAM: TRANSABDOMINAL AND TRANSVAGINAL ULTRASOUND OF PELVIS  TECHNIQUE: Both transabdominal and transvaginal ultrasound examinations of the pelvis were performed. Transabdominal technique was performed for global imaging of the pelvis including uterus, ovaries, adnexal regions, and pelvic  cul-de-sac. It was necessary to proceed with endovaginal exam following the transabdominal exam to visualize the uterus and bilateral ovaries.  COMPARISON:  None  FINDINGS: Uterus  Measurements: 7.5 x 3.9 x 4.2 cm. No fibroids or other mass visualized.  Endometrium  Thickness: 5 mm.  No focal abnormality visualized.  Right ovary  Measurements: 3.1 x 1.9 x 2.0 cm. Normal appearance/no adnexal mass.  Left ovary  Measurements: 3.0 x 1.4 x 1.6 cm. Normal appearance/no adnexal mass.  Other findings  No free fluid.  IMPRESSION: Negative pelvic ultrasound.   Electronically Signed   By: Julian Hy M.D.   On: 07/31/2014 23:29   US Pelvis Complete  07/31/2014   CLINICAL DATA:  Abdominal pain  EXAM: TRANSABDOMINAL AND TRANSVAGINAL ULTRASOUND OF PELVIS  TECHNIQUE: Both transabdominal and transvaginal ultrasound examinations of the pelvis were performed. Transabdominal technique was performed for global imaging of the pelvis including uterus, ovaries, adnexal regions, and pelvic cul-de-sac. It was necessary to proceed with endovaginal exam following the transabdominal exam to visualize the uterus and bilateral ovaries.  COMPARISON:  None  FINDINGS: Uterus  Measurements: 7.5 x 3.9 x 4.2 cm. No fibroids or other mass visualized.  Endometrium  Thickness: 5 mm.  No focal abnormality visualized.  Right ovary  Measurements: 3.1 x 1.9 x 2.0 cm. Normal appearance/no adnexal mass.  Left ovary  Measurements: 3.0 x 1.4 x 1.6 cm. Normal appearance/no adnexal mass.  Other findings  No free fluid.  IMPRESSION: Negative pelvic ultrasound.   Electronically Signed   By: Julian Hy M.D.   On: 07/31/2014 23:29     EKG Interpretation None      MDM   Final diagnoses:  PID (pelvic inflammatory disease)  BV (bacterial vaginosis)  Lower abdominal pain    Patient in no apparent distress. Afebrile, vital signs stable. This is her fourth visit since January 16. Abdomen is soft with mild tenderness across lower abdomen. No  peritoneal signs. Associated vaginal discharge. Yellow vaginal discharge noted on pelvic exam with cervical motion tenderness and mild adnexal tenderness. Ultrasound obtained, no acute findings. She is resting comfortably on exam bed. Doubt appendicitis/diverticulitis. Given CMT, will treat for PID. Rocephin and azithromycin given in the ED. Discharge home with Flagyl. She is currently on doxycycline, another prescription for doxycycline to complete a two-week course of this medication. Follow-up with PCP. Stable for discharge. Return  precautions given. Patient states understanding of treatment care plan and is agreeable.  Carman Ching, PA-C 07/31/14 [place], PA-C 07/31/14 [id number]  Carmin Muskrat, MD 08/01/14 [phone]

## 2014-07-31 NOTE — ED Notes (Signed)
Patient returned from Ultrasound. 

## 2014-07-31 NOTE — Discharge Instructions (Signed)
Take Flagyl as directed for 1 week. Continue taking doxycycline for an additional week after you complete the antibiotic that you are on at present.  Abdominal Pain, Women Abdominal (stomach, pelvic, or belly) pain can be caused by many things. It is important to tell your doctor:  The location of the pain.  Does it come and go or is it present all the time?  Are there things that start the pain (eating certain foods, exercise)?  Are there other symptoms associated with the pain (fever, nausea, vomiting, diarrhea)? All of this is helpful to know when trying to find the cause of the pain. CAUSES   Stomach: virus or bacteria infection, or ulcer.  Intestine: appendicitis (inflamed appendix), regional ileitis (Crohn's disease), ulcerative colitis (inflamed colon), irritable bowel syndrome, diverticulitis (inflamed diverticulum of the colon), or cancer of the stomach or intestine.  Gallbladder disease or stones in the gallbladder.  Kidney disease, kidney stones, or infection.  Pancreas infection or cancer.  Fibromyalgia (pain disorder).  Diseases of the female organs:  Uterus: fibroid (non-cancerous) tumors or infection.  Fallopian tubes: infection or tubal pregnancy.  Ovary: cysts or tumors.  Pelvic adhesions (scar tissue).  Endometriosis (uterus lining tissue growing in the pelvis and on the pelvic organs).  Pelvic congestion syndrome (female organs filling up with blood just before the menstrual period).  Pain with the menstrual period.  Pain with ovulation (producing an egg).  Pain with an IUD (intrauterine device, birth control) in the uterus.  Cancer of the female organs.  Functional pain (pain not caused by a disease, may improve without treatment).  Psychological pain.  Depression. DIAGNOSIS  Your doctor will decide the seriousness of your pain by doing an examination.  Blood tests.  X-rays.  Ultrasound.  CT scan (computed tomography, special type of  X-ray).  MRI (magnetic resonance imaging).  Cultures, for infection.  Barium enema (dye inserted in the large intestine, to better view it with X-rays).  Colonoscopy (looking in intestine with a lighted tube).  Laparoscopy (minor surgery, looking in abdomen with a lighted tube).  Major abdominal exploratory surgery (looking in abdomen with a large incision). TREATMENT  The treatment will depend on the cause of the pain.   Many cases can be observed and treated at home.  Over-the-counter medicines recommended by your caregiver.  Prescription medicine.  Antibiotics, for infection.  Birth control pills, for painful periods or for ovulation pain.  Hormone treatment, for endometriosis.  Nerve blocking injections.  Physical therapy.  Antidepressants.  Counseling with a psychologist or psychiatrist.  Minor or major surgery. HOME CARE INSTRUCTIONS   Do not take laxatives, unless directed by your caregiver.  Take over-the-counter pain medicine only if ordered by your caregiver. Do not take aspirin because it can cause an upset stomach or bleeding.  Try a clear liquid diet (broth or water) as ordered by your caregiver. Slowly move to a bland diet, as tolerated, if the pain is related to the stomach or intestine.  Have a thermometer and take your temperature several times a day, and record it.  Bed rest and sleep, if it helps the pain.  Avoid sexual intercourse, if it causes pain.  Avoid stressful situations.  Keep your follow-up appointments and tests, as your caregiver orders.  If the pain does not go away with medicine or surgery, you may try:  Acupuncture.  Relaxation exercises (yoga, meditation).  Group therapy.  Counseling. SEEK MEDICAL CARE IF:   You notice certain foods cause stomach pain.  Your home care treatment is not helping your pain.  You need stronger pain medicine.  You want your IUD removed.  You feel faint or lightheaded.  You  develop nausea and vomiting.  You develop a rash.  You are having side effects or an allergy to your medicine. SEEK IMMEDIATE MEDICAL CARE IF:   Your pain does not go away or gets worse.  You have a fever.  Your pain is felt only in portions of the abdomen. The right side could possibly be appendicitis. The left lower portion of the abdomen could be colitis or diverticulitis.  You are passing blood in your stools (bright red or black tarry stools, with or without vomiting).  You have blood in your urine.  You develop chills, with or without a fever.  You pass out. MAKE SURE YOU:   Understand these instructions.  Will watch your condition.  Will get help right away if you are not doing well or get worse. Document Released: 04/10/2007 Document Revised: 10/28/2013 Document Reviewed: 04/30/2009 [facility] Patient Information 2015 [place], [place]. This information is not intended to replace advice given to you by your health care provider. Make sure you discuss any questions you have with your health care provider.  Bacterial Vaginosis Bacterial vaginosis is a vaginal infection that occurs when the normal balance of bacteria in the vagina is disrupted. It results from an overgrowth of certain bacteria. This is the most common vaginal infection in women of childbearing age. Treatment is important to prevent complications, especially in pregnant women, as it can cause a premature delivery. CAUSES  Bacterial vaginosis is caused by an increase in harmful bacteria that are normally present in smaller amounts in the vagina. Several different kinds of bacteria can cause bacterial vaginosis. However, the reason that the condition develops is not fully understood. RISK FACTORS Certain activities or behaviors can put you at an increased risk of developing bacterial vaginosis, including:  Having a new sex partner or multiple sex partners.  Douching.  Using an intrauterine device (IUD) for  contraception. Women do not get bacterial vaginosis from toilet seats, bedding, swimming pools, or contact with objects around them. SIGNS AND SYMPTOMS  Some women with bacterial vaginosis have no signs or symptoms. Common symptoms include:  Grey vaginal discharge.  A fishlike odor with discharge, especially after sexual intercourse.  Itching or burning of the vagina and vulva.  Burning or pain with urination. DIAGNOSIS  Your health care provider will take a medical history and examine the vagina for signs of bacterial vaginosis. A sample of vaginal fluid may be taken. Your health care provider will look at this sample under a microscope to check for bacteria and abnormal cells. A vaginal pH test may also be done.  TREATMENT  Bacterial vaginosis may be treated with antibiotic medicines. These may be given in the form of a pill or a vaginal cream. A second round of antibiotics may be prescribed if the condition comes back after treatment.  HOME CARE INSTRUCTIONS   Only take over-the-counter or prescription medicines as directed by your health care provider.  If antibiotic medicine was prescribed, take it as directed. Make sure you finish it even if you start to feel better.  Do not have sex until treatment is completed.  Tell all sexual partners that you have a vaginal infection. They should see their health care provider and be treated if they have problems, such as a mild rash or itching.  Practice safe sex by using condoms  and only having one sex partner. SEEK MEDICAL CARE IF:   Your symptoms are not improving after 3 days of treatment.  You have increased discharge or pain.  You have a fever. MAKE SURE YOU:   Understand these instructions.  Will watch your condition.  Will get help right away if you are not doing well or get worse. FOR MORE INFORMATION  Centers for Disease Control and Prevention, Division of STD Prevention: [employer] American Sexual Health  Association (ASHA): www.ashastd.org  Document Released: 06/13/2005 Document Revised: 04/03/2013 Document Reviewed: 01/23/2013 [facility] Patient Information 2015 [place], [place]. This information is not intended to replace advice given to you by your health care provider. Make sure you discuss any questions you have with your health care provider.  Pelvic Inflammatory Disease Pelvic inflammatory disease (PID) refers to an infection in some or all of the female organs. The infection can be in the uterus, ovaries, fallopian tubes, or the surrounding tissues in the pelvis. PID can cause abdominal or pelvic pain that comes on suddenly (acute pelvic pain). PID is a serious infection because it can lead to lasting (chronic) pelvic pain or the inability to have children (infertile).  CAUSES  The infection is often caused by the normal bacteria found in the vaginal tissues. PID may also be caused by an infection that is spread during sexual contact. PID can also occur following:   The birth of a baby.   A miscarriage.   An abortion.   Major pelvic surgery.   The use of an intrauterine device (IUD).   A sexual assault.  RISK FACTORS Certain factors can put a person at higher risk for PID, such as:  Being younger than 25 years.  Being sexually active at [place] age.  Usingnonbarrier contraception.  Havingmultiple sexual partners.  Having sex with someone who has symptoms of a genital infection.  Using oral contraception. Other times, certain behaviors can increase the possibility of getting PID, such as:  Having sex during your period.  Using a vaginal douche.  Having an intrauterine device (IUD) in place. SYMPTOMS   Abdominal or pelvic pain.   Fever.   Chills.   Abnormal vaginal discharge.  Abnormal uterine bleeding.   Unusual pain shortly after finishing your period. DIAGNOSIS  Your caregiver will choose some of the following methods to make a diagnosis, such  as:   Performinga physical exam and history. A pelvic exam typically reveals a very tender uterus and surrounding pelvis.   Ordering laboratory tests including a pregnancy test, blood tests, and urine test.  Orderingcultures of the vagina and cervix to check for a sexually transmitted infection (STI).  Performing an ultrasound.   Performing a laparoscopic procedure to look inside the pelvis.  TREATMENT   Antibiotic medicines may be prescribed and taken by mouth.   Sexual partners may be treated when the infection is caused by a sexually transmitted disease (STD).   Hospitalization may be needed to give antibiotics intravenously.  Surgery may be needed, but this is rare. It may take weeks until you are completely well. If you are diagnosed with PID, you should also be checked for human immunodeficiency virus (HIV). HOME CARE INSTRUCTIONS   If given, take your antibiotics as directed. Finish the medicine even if you start to feel better.   Only take over-the-counter or prescription medicines for pain, discomfort, or fever as directed by your caregiver.   Do not have sexual intercourse until treatment is completed or as directed by your caregiver. If  PID is confirmed, your recent sexual partner(s) will need treatment.   Keep your follow-up appointments. SEEK MEDICAL CARE IF:   You have increased or abnormal vaginal discharge.   You need prescription medicine for your pain.   You vomit.   You cannot take your medicines.   Your partner has an STD.  SEEK IMMEDIATE MEDICAL CARE IF:   You have a fever.   You have increased abdominal or pelvic pain.   You have chills.   You have pain when you urinate.   You are not better after 72 hours following treatment.  MAKE SURE YOU:   Understand these instructions.  Will watch your condition.  Will get help right away if you are not doing well or get worse. Document Released: 06/13/2005 Document Revised:  10/08/2012 Document Reviewed: 06/09/2011 [facility] Patient Information 2015 [place], [place]. This information is not intended to replace advice given to you by your health care provider. Make sure you discuss any questions you have with your health care provider.

## 2014-07-31 NOTE — ED Notes (Signed)
Patient transported to Ultrasound 

## 2014-07-31 NOTE — ED Notes (Signed)
Pt c/o lower abd pain and back pain starting today; pt seen yesterday at [facility] for bronchitis

## 2014-07-31 NOTE — ED Notes (Signed)
Being treated for bronchitis and Sinusitis with Doxycycline, Flonase and Prednisone.  Seen yesterday.  C/o low abdominal pain. Started yesterday on lower sides and now in bil. low abdomen.  States it feels like contractions-unbearable. Pain radiates to her upper legs.   No N, V or D or fever.  No vaginal discharge. Has been voiding frequently in normal amounts x 2-3 days. Does not have periods because of birth control.

## 2014-07-31 NOTE — Discharge Instructions (Signed)
We have determined that your problem requires further evaluation in the emergency department.  We will take care of your transport there.  Once at the emergency department, you will be evaluated by a provider and they will order whatever treatment or tests they deem necessary.  We cannot guarantee that they will do any specific test or do any specific treatment.  ° °

## 2014-08-01 LAB — GC/CHLAMYDIA PROBE AMP (~~LOC~~) NOT AT ARMC
Chlamydia: NEGATIVE
NEISSERIA GONORRHEA: NEGATIVE

## 2014-08-20 ENCOUNTER — Encounter (HOSPITAL_COMMUNITY): Payer: Self-pay

## 2014-08-20 ENCOUNTER — Emergency Department (HOSPITAL_COMMUNITY)
Admission: EM | Admit: 2014-08-20 | Discharge: 2014-08-20 | Disposition: A | Discharge status: Home / Self Care | Payer: No Typology Code available for payment source | Source: Home / Self Care | Attending: Emergency Medicine | Admitting: Emergency Medicine

## 2014-08-20 DIAGNOSIS — Z889 Allergy status to unspecified drugs, medicaments and biological substances status: Secondary | ICD-10-CM

## 2014-08-20 DIAGNOSIS — T7840XA Allergy, unspecified, initial encounter: Secondary | ICD-10-CM

## 2014-08-20 MED ORDER — FLUCONAZOLE 150 MG PO TABS: 150.0000 mg | ORAL_TABLET | Freq: Once | ORAL | Status: DC

## 2014-08-20 MED ORDER — HYDROCORTISONE 1 % EX CREA: TOPICAL_CREAM | CUTANEOUS | Status: DC

## 2014-08-20 MED ORDER — METHYLPREDNISOLONE ACETATE 80 MG/ML IJ SUSP
80.0000 mg | Freq: Once | INTRAMUSCULAR | Status: AC
  Administered 2014-08-20: 80 mg via INTRAMUSCULAR

## 2014-08-20 MED ORDER — HYDROXYZINE HCL 25 MG PO TABS: 25.0000 mg | ORAL_TABLET | Freq: Four times a day (QID) | ORAL | Status: DC | PRN

## 2014-08-20 MED ORDER — PREDNISONE 20 MG PO TABS: ORAL_TABLET | ORAL | Status: DC

## 2014-08-20 MED ORDER — TRIAMCINOLONE ACETONIDE 0.1 % EX CREA: TOPICAL_CREAM | CUTANEOUS | Status: DC

## 2014-08-20 MED ORDER — METHYLPREDNISOLONE ACETATE 80 MG/ML IJ SUSP
INTRAMUSCULAR | Status: AC
  Filled 2014-08-20: qty 1

## 2014-08-20 NOTE — ED Provider Notes (Signed)
Chief Complaint    Medication Reaction   History of Present Illness      Christina Fox is a 34 year old female who's been in multiple times in the past several months. She was in with a prolonged episode of cough diagnosed as having bronchitis and treated with doxycycline for that. Then a few days later she returned with abdominal pain. She was sent over to the emergency room and was diagnosed as having PID. She was given another round of doxycycline for that. She finished up her antibiotics this past Friday a week ago with neck ache broke out in a rash in her face, chest, and neck. She has had some itching. Denies any fever or chills. She's had a headache. No difficulty breathing. She's not taking any other medications and cannot recall any other contactants the medical cause this.  Review of Systems   Other than as noted above, the patient denies any of the following symptoms: Systemic:  No fever or chills. ENT:  No nasal congestion, rhinorrhea, sore throat, swelling of lips, tongue or throat. Resp:  No cough, wheezing, or shortness of breath.  Ricardo    Past medical history, family history, social history, meds, and allergies were reviewed.   Physical Exam     Vital signs:  BP 140/81 mmHg  Pulse 98  Temp(Src) 98.6 F (37 C) (Oral)  Resp 16  SpO2 97% Gen:  Alert, oriented, in no distress. ENT:  Pharynx clear, no intraoral lesions, moist mucous membranes. Lungs:  Clear to auscultation. Skin:  There is a fine, maculopapular to pustular rash on the face, neck, and upper chest. This appears to be in the sun exposed areas, however the patient denies any significant sun exposure.  Course in Urgent [place]     The following meds were given:  Medications  methylPREDNISolone acetate (DEPO-MEDROL) injection 80 mg (80 mg Intramuscular Given 08/20/14 1834)   Assessment    The encounter diagnosis was Allergic reaction to drug.  This could be allergic reaction to  doxycycline, or a photosensitivity reaction. It doesn't go away with the above treatments she'll need to see a dermatologist.  Plan     1.  Meds:  The following meds were prescribed:   Discharge Medication List as of 08/20/2014  6:16 PM    START taking these medications   Details  fluconazole (DIFLUCAN) 150 MG tablet Take 1 tablet (150 mg total) by mouth once., Starting 08/20/2014, Normal    hydrocortisone cream 1 % Apply to rash on face TID, Normal    hydrOXYzine (ATARAX/VISTARIL) 25 MG tablet Take 1 tablet (25 mg total) by mouth every 6 (six) hours as needed for itching., Starting 08/20/2014, Until Discontinued, Normal    !! predniSONE (DELTASONE) 20 MG tablet Take 3 daily for 5 days, 2 daily for 5 days, 1 daily for 5 days., Normal    triamcinolone cream (KENALOG) 0.1 % Apply to rash on body TID, Normal     !! - Potential duplicate medications found. Please discuss with provider.      2.  Patient Education/Counseling:  The patient was given appropriate handouts, self care instructions, and instructed in symptomatic relief.    3.  Follow up:  The patient was told to follow up here if no better in 3 to 4 days, or sooner if becoming worse in any way, and given some red flag symptoms such as worsening rash, fever, or difficulty breathing which would prompt immediate return.  Follow up here if  necessary.      Harden Mo, MD 08/20/14 [phone]

## 2014-08-20 NOTE — Discharge Instructions (Signed)
Drug Allergy °Allergic reactions to medicines are common. Some allergic reactions are mild. A delayed type of drug allergy that occurs 1 week or more after exposure to a medicine or vaccine is called serum sickness. A life-threatening, sudden (acute) allergic reaction that involves the whole body is called anaphylaxis. °CAUSES  °"True" drug allergies occur when there is an allergic reaction to a medicine. This is caused by overactivity of the immune system. First, the body becomes sensitized. The immune system is triggered by your first exposure to the medicine. Following this first exposure, future exposure to the same medicine may be life-threatening. °Almost any medicine can cause an allergic reaction. Common ones are: °· Penicillin. °· Sulfonamides (sulfa drugs). °· Local anesthetics. °· X-ray dyes that contain iodine. °SYMPTOMS  °Common symptoms of a minor allergic reaction are: °· Swelling around the mouth. °· An itchy red rash or hives. °· Vomiting or diarrhea. °Anaphylaxis can cause swelling of the mouth and throat. This makes it difficult to breathe and swallow. Severe reactions can be fatal within seconds, even after exposure to only a trace amount of the drug that causes the reaction. °HOME CARE INSTRUCTIONS  °· If you are unsure of what caused your reaction, keep a diary of foods and medicines used. Include the symptoms that followed. Avoid anything that causes reactions. °· You may want to follow up with an allergy specialist after the reaction has cleared in order to be tested to confirm the allergy. It is important to confirm that your reaction is an allergy, not just a side effect to the medicine. If you have a true allergy to a medicine, this may prevent that medicine and related medicines from being given to you when you are very ill. °· If you have hives or a rash: °¨ Take medicines as directed by your caregiver. °¨ You may use an over-the-counter antihistamine (diphenhydramine) as  needed. °¨ Apply cold compresses to the skin or take baths in cool water. Avoid hot baths or showers. °· If you are severely allergic: °¨ Continuous observation after a severe reaction may be needed. Hospitalization is often required. °¨ Wear a medical alert bracelet or necklace stating your allergy. °¨ You and your family must learn how to use an anaphylaxis kit or give an epinephrine injection to temporarily treat an emergency allergic reaction. If you have had a severe reaction, always carry your epinephrine injection or anaphylaxis kit with you. This can be lifesaving if you have a severe reaction. °· Do not drive or perform tasks after treatment until the medicines used to treat your reaction have worn off, or until your caregiver says it is okay. °SEEK MEDICAL CARE IF:  °· You think you had an allergic reaction. Symptoms usually start within 30 minutes after exposure. °· Symptoms are getting worse rather than better. °· You develop new symptoms. °· The symptoms that brought you to your caregiver return. °SEEK IMMEDIATE MEDICAL CARE IF:  °· You have swelling of the mouth, difficulty breathing, or wheezing. °· You have a tight feeling in your chest or throat. °· You develop hives, swelling, or itching all over your body. °· You develop severe vomiting or diarrhea. °· You feel faint or pass out. °This is an emergency. Use your epinephrine injection or anaphylaxis kit as you have been instructed. Call for emergency medical help. Even if you improve after the injection, you need to be examined at a hospital emergency department. °MAKE SURE YOU:  °· Understand these instructions. °· Will watch   your condition.  Will get help right away if you are not doing well or get worse. Document Released: 06/13/2005 Document Revised: 09/05/2011 Document Reviewed: 11/17/2010 [facility] Patient Information 2015 [place], [place]. This information is not intended to replace advice given to you by your health care provider. Make  sure you discuss any questions you have with your health care provider.

## 2014-08-20 NOTE — ED Notes (Signed)
Was given Im antibiotics, PO antibiotics for recent PID, and has developed rash on chest, and what she suspects is another yeast infection

## 2014-09-25 ENCOUNTER — Emergency Department (INDEPENDENT_AMBULATORY_CARE_PROVIDER_SITE_OTHER)
Admission: EM | Admit: 2014-09-25 | Discharge: 2014-09-25 | Disposition: A | Discharge status: Home / Self Care | Payer: No Typology Code available for payment source | Source: Home / Self Care | Attending: Family Medicine | Admitting: Family Medicine

## 2014-09-25 ENCOUNTER — Encounter (HOSPITAL_COMMUNITY): Payer: Self-pay | Admitting: Emergency Medicine

## 2014-09-25 DIAGNOSIS — J302 Other seasonal allergic rhinitis: Secondary | ICD-10-CM

## 2014-09-25 DIAGNOSIS — N390 Urinary tract infection, site not specified: Secondary | ICD-10-CM

## 2014-09-25 MED ORDER — CEPHALEXIN 500 MG PO CAPS: 500.0000 mg | ORAL_CAPSULE | Freq: Four times a day (QID) | ORAL | Status: DC

## 2014-09-25 NOTE — ED Provider Notes (Signed)
CSN: [id number]     Arrival date & time 09/25/14  1457 History   First MD Initiated Contact with Patient 09/25/14 1654     Chief Complaint  Patient presents with  . Sore Throat  . Urinary Frequency   (Consider location/radiation/quality/duration/timing/severity/associated sxs/prior Treatment) HPI Comments: 34 year old female complaining of a one-week history of sniffles, nasal congestion, sore throat, right earache and PND. She is also complaining of urinary frequency at night for 2 days.  Patient is a 34 y.o. female presenting with pharyngitis and frequency.  Sore Throat  Urinary Frequency    Past Medical History  Diagnosis Date  . Headache(784.0)     sinus  . GERD (gastroesophageal reflux disease)   . Kidney stones     no current problem  . Anxiety   . Deviated septum 09/2011  . 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  . Complication of anesthesia     states small mouth  . Pelvic inflammatory disease    Past Surgical History  Procedure Laterality Date  . Polyps removed      colon  . Wisdom tooth extraction    . Nasal septoplasty w/ turbinoplasty  10/04/2011    Procedure: NASAL SEPTOPLASTY WITH TURBINATE REDUCTION;  Surgeon: Ascencion Dike, MD;  Location: [facility];  Service: ENT;  Laterality: Bilateral;  . Tonsillectomy     Family History  Problem Relation Age of Onset  . Cancer Mother   . Hypertension Mother   . Hypertension Father   . Liver disease Father   . Cancer Brother   . Hypertension Other    History  Substance Use Topics  . Smoking status: Former Smoker -- 0.25 packs/day  . Smokeless tobacco: Never Used  . Alcohol Use: No   OB History    Gravida Para Term Preterm AB TAB SAB Ectopic Multiple Living   4 4 4       4      Review of Systems  Constitutional: Positive for activity change and fatigue. Negative for fever, chills and appetite change.  HENT: Positive for congestion, postnasal drip, rhinorrhea, sinus pressure and sore  throat. Negative for facial swelling.   Eyes: Negative.   Respiratory: Negative.   Cardiovascular: Negative.   Gastrointestinal: Negative.   Genitourinary: Positive for frequency.  Musculoskeletal: Negative for neck pain and neck stiffness.  Skin: Negative for pallor and rash.  Neurological: Negative.     Allergies  Sulfa antibiotics  Home Medications   Prior to Admission medications   Medication Sig Start Date End Date Taking? Authorizing Provider  albuterol (PROVENTIL HFA;VENTOLIN HFA) 108 (90 BASE) MCG/ACT inhaler Inhale 1-2 puffs into the lungs every 6 (six) hours as needed for wheezing or shortness of breath. 07/30/14   Harden Mo, MD  cephALEXin (KEFLEX) 500 MG capsule Take 1 capsule (500 mg total) by mouth 4 (four) times daily. 09/25/14   Janne Napoleon, NP  clonazePAM (KLONOPIN) 1 MG tablet Take 1 mg by mouth daily.     Historical Provider, MD  fluconazole (DIFLUCAN) 150 MG tablet Take 1 tablet (150 mg total) by mouth once. 08/20/14   Harden Mo, MD  fluticasone (FLONASE) 50 MCG/ACT nasal spray Place 2 sprays into both nostrils daily. 07/30/14   Harden Mo, MD  hydrocortisone cream 1 % Apply to rash on face TID 08/20/14   Harden Mo, MD  hydrOXYzine (ATARAX/VISTARIL) 25 MG tablet Take 1 tablet (25 mg total) by mouth every 6 (six) hours as needed  for itching. 08/20/14   Harden Mo, MD  ibuprofen (ADVIL,MOTRIN) 400 MG tablet Take 1 tablet (400 mg total) by mouth every 6 (six) hours as needed. 07/13/14   Varney Biles, MD  levonorgestrel-ethinyl estradiol (AUBRA) 0.1-20 MG-MCG tablet Take 1 tablet by mouth daily.    Historical Provider, MD  naproxen (NAPROSYN) 500 MG tablet Take 1 tablet (500 mg total) by mouth 2 (two) times daily. 07/12/14   Fredia Sorrow, MD  omeprazole (PRILOSEC) 20 MG capsule Take 1 capsule (20 mg total) by mouth daily. 04/01/14   Dahlia Bailiff, PA-C  ondansetron (ZOFRAN ODT) 4 MG disintegrating tablet Take 1 tablet (4 mg total) by mouth every 8 (eight)  hours as needed for nausea or vomiting. 07/12/14   Fredia Sorrow, MD  triamcinolone cream (KENALOG) 0.1 % Apply to rash on body TID 08/20/14   Harden Mo, MD   BP 144/100 mmHg  Pulse 81  Temp(Src) 99.1 F (37.3 C) (Oral)  Resp 20  SpO2 98% Physical Exam  Constitutional: She is oriented to person, place, and time. She appears well-developed and well-nourished. No distress.  HENT:  Mouth/Throat: No oropharyngeal exudate.  Bilateral TMs are transparent and without apparent effusion. There is mild retraction in both ears. Oropharynx with erythema and cobblestoning with clear PND.  Eyes: Conjunctivae and EOM are normal.  Neck: Normal range of motion. Neck supple.  Cardiovascular: Normal rate, regular rhythm, normal heart sounds and intact distal pulses.   Pulmonary/Chest: Breath sounds normal. No respiratory distress. She has no wheezes. She has no rales.  Musculoskeletal: She exhibits no edema.  Neurological: She is alert and oriented to person, place, and time.  Skin: Skin is warm and dry.  Psychiatric: She has a normal mood and affect.  Nursing note and vitals reviewed.   ED Course  Procedures (including critical care time) Labs Review Labs Reviewed - No data to display  Imaging Review No results found.   MDM   1. Other seasonal allergic rhinitis   2. UTI (lower urinary tract infection)    FLonase or Rhinocort nasal spray Allegra or Claritin or Zyrtec for drainage Sudafed PE for congestion Lots of saline nasal spray for congestion Keflex for UTI Lots of fluids    Janne Napoleon, NP 09/25/14 1714

## 2014-09-25 NOTE — ED Notes (Addendum)
Sore throat and stuffy nose, weakness, tired onset a week ago.  Has tried zyrtec, allegra d, Claritin, sudafed C/o frequent urination, no pain.  Patient also volunteers she is having breast tenderness.  Patient is on a birth control pill that was changed recently

## 2014-09-25 NOTE — Discharge Instructions (Signed)
Allergic Rhinitis FLonase or Rhinocort nasal spray Allegra or Claritin or Zyrtec for drainage Sudafed PE for congestion Lots of saline nasal spray for congestion Allergic rhinitis is when the mucous membranes in the nose respond to allergens. Allergens are particles in the air that cause your body to have an allergic reaction. This causes you to release allergic antibodies. Through a chain of events, these eventually cause you to release histamine into the blood stream. Although meant to protect the body, it is this release of histamine that causes your discomfort, such as frequent sneezing, congestion, and an itchy, runny nose.  CAUSES  Seasonal allergic rhinitis (hay fever) is caused by pollen allergens that may come from grasses, trees, and weeds. Year-round allergic rhinitis (perennial allergic rhinitis) is caused by allergens such as house dust mites, pet dander, and mold spores.  SYMPTOMS   Nasal stuffiness (congestion).  Itchy, runny nose with sneezing and tearing of the eyes. DIAGNOSIS  Your health care provider can help you determine the allergen or allergens that trigger your symptoms. If you and your health care provider are unable to determine the allergen, skin or blood testing may be used. TREATMENT  Allergic rhinitis does not have a cure, but it can be controlled by:  Medicines and allergy shots (immunotherapy).  Avoiding the allergen. Hay fever may often be treated with antihistamines in pill or nasal spray forms. Antihistamines block the effects of histamine. There are over-the-counter medicines that may help with nasal congestion and swelling around the eyes. Check with your health care provider before taking or giving this medicine.  If avoiding the allergen or the medicine prescribed do not work, there are many new medicines your health care provider can prescribe. Stronger medicine may be used if initial measures are ineffective. Desensitizing injections can be used if  medicine and avoidance does not work. Desensitization is when a patient is given ongoing shots until the body becomes less sensitive to the allergen. Make sure you follow up with your health care provider if problems continue. HOME CARE INSTRUCTIONS It is not possible to completely avoid allergens, but you can reduce your symptoms by taking steps to limit your exposure to them. It helps to know exactly what you are allergic to so that you can avoid your specific triggers. SEEK MEDICAL CARE IF:   You have a fever.  You develop a cough that does not stop easily (persistent).  You have shortness of breath.  You start wheezing.  Symptoms interfere with normal daily activities. Document Released: 03/08/2001 Document Revised: 06/18/2013 Document Reviewed: 02/18/2013 [facility] Patient Information 2015 [place], [place]. This information is not intended to replace advice given to you by your health care provider. Make sure you discuss any questions you have with your health care provider.  Urinary Tract Infection Urinary tract infections (UTIs) can develop anywhere along your urinary tract. Your urinary tract is your body's drainage system for removing wastes and extra water. Your urinary tract includes two kidneys, two ureters, a bladder, and a urethra. Your kidneys are a pair of bean-shaped organs. Each kidney is about the size of your fist. They are located below your ribs, one on each side of your spine. CAUSES Infections are caused by microbes, which are microscopic organisms, including fungi, viruses, and bacteria. These organisms are so small that they can only be seen through a microscope. Bacteria are the microbes that most commonly cause UTIs. SYMPTOMS  Symptoms of UTIs may vary by age and gender of the patient and by  the location of the infection. Symptoms in young women typically include a frequent and intense urge to urinate and a painful, burning feeling in the bladder or urethra during  urination. Older women and men are more likely to be tired, shaky, and weak and have muscle aches and abdominal pain. A fever may mean the infection is in your kidneys. Other symptoms of a kidney infection include pain in your back or sides below the ribs, nausea, and vomiting. DIAGNOSIS To diagnose a UTI, your caregiver will ask you about your symptoms. Your caregiver also will ask to provide a urine sample. The urine sample will be tested for bacteria and white blood cells. White blood cells are made by your body to help fight infection. TREATMENT  Typically, UTIs can be treated with medication. Because most UTIs are caused by a bacterial infection, they usually can be treated with the use of antibiotics. The choice of antibiotic and length of treatment depend on your symptoms and the type of bacteria causing your infection. HOME CARE INSTRUCTIONS  If you were prescribed antibiotics, take them exactly as your caregiver instructs you. Finish the medication even if you feel better after you have only taken some of the medication.  Drink enough water and fluids to keep your urine clear or pale yellow.  Avoid caffeine, tea, and carbonated beverages. They tend to irritate your bladder.  Empty your bladder often. Avoid holding urine for long periods of time.  Empty your bladder before and after sexual intercourse.  After a bowel movement, women should cleanse from front to back. Use each tissue only once. SEEK MEDICAL CARE IF:   You have back pain.  You develop a fever.  Your symptoms do not begin to resolve within 3 days. SEEK IMMEDIATE MEDICAL CARE IF:   You have severe back pain or lower abdominal pain.  You develop chills.  You have nausea or vomiting.  You have continued burning or discomfort with urination. MAKE SURE YOU:   Understand these instructions.  Will watch your condition.  Will get help right away if you are not doing well or get worse. Document Released:  03/23/2005 Document Revised: 12/13/2011 Document Reviewed: 07/22/2011 [facility] Patient Information 2015 [place], [place]. This information is not intended to replace advice given to you by your health care provider. Make sure you discuss any questions you have with your health care provider.

## 2014-10-07 ENCOUNTER — Emergency Department (INDEPENDENT_AMBULATORY_CARE_PROVIDER_SITE_OTHER)
Admission: EM | Admit: 2014-10-07 | Discharge: 2014-10-07 | Disposition: A | Discharge status: Home / Self Care | Payer: No Typology Code available for payment source | Source: Home / Self Care | Attending: Family Medicine | Admitting: Family Medicine

## 2014-10-07 ENCOUNTER — Encounter (HOSPITAL_COMMUNITY): Payer: Self-pay | Admitting: Emergency Medicine

## 2014-10-07 DIAGNOSIS — J302 Other seasonal allergic rhinitis: Secondary | ICD-10-CM

## 2014-10-07 DIAGNOSIS — J069 Acute upper respiratory infection, unspecified: Secondary | ICD-10-CM

## 2014-10-07 LAB — POCT RAPID STREP A: Streptococcus, Group A Screen (Direct): NEGATIVE

## 2014-10-07 MED ORDER — IPRATROPIUM BROMIDE 0.06 % NA SOLN: 2.0000 | Freq: Four times a day (QID) | NASAL | Status: DC

## 2014-10-07 NOTE — ED Notes (Signed)
Sore throat, stuffy nose, runny nose, and swollen glands. Onset 3/29 per patient.  Patient has chronic sinus issues

## 2014-10-07 NOTE — ED Provider Notes (Signed)
CSN: [id number]     Arrival date & time 10/07/14  1107 History   First MD Initiated Contact with Patient 10/07/14 1212     Chief Complaint  Patient presents with  . Sore Throat   (Consider location/radiation/quality/duration/timing/severity/associated sxs/prior Treatment) Patient is a 34 y.o. female presenting with URI. The history is provided by the patient.  URI Presenting symptoms: congestion, cough, rhinorrhea and sore throat   Presenting symptoms: no ear pain, no facial pain, no fatigue and no fever   Severity:  Mild Onset quality:  Gradual Duration:  1 day Timing:  Constant Progression:  Unchanged Chronicity:  New   Past Medical History  Diagnosis Date  . Headache(784.0)     sinus  . GERD (gastroesophageal reflux disease)   . Kidney stones     no current problem  . Anxiety   . Deviated septum 09/2011  . 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  . Complication of anesthesia     states small mouth  . Pelvic inflammatory disease    Past Surgical History  Procedure Laterality Date  . Polyps removed      colon  . Wisdom tooth extraction    . Nasal septoplasty w/ turbinoplasty  10/04/2011    Procedure: NASAL SEPTOPLASTY WITH TURBINATE REDUCTION;  Surgeon: Ascencion Dike, MD;  Location: [place];  Service: ENT;  Laterality: Bilateral;  . Tonsillectomy     Family History  Problem Relation Age of Onset  . Cancer Mother   . Hypertension Mother   . Hypertension Father   . Liver disease Father   . Cancer Brother   . Hypertension Other    History  Substance Use Topics  . Smoking status: Former Smoker -- 0.25 packs/day  . Smokeless tobacco: Never Used  . Alcohol Use: No   OB History    Gravida Para Term Preterm AB TAB SAB Ectopic Multiple Living   4 4 4       4      Review of Systems  Constitutional: Negative for fever and fatigue.  HENT: Positive for congestion, rhinorrhea and sore throat. Negative for ear pain.   Eyes: Negative.    Respiratory: Positive for cough. Negative for chest tightness and shortness of breath.   Cardiovascular: Negative.   Gastrointestinal: Negative.     Allergies  Sulfa antibiotics  Home Medications   Prior to Admission medications   Medication Sig Start Date End Date Taking? Authorizing Provider  albuterol (PROVENTIL HFA;VENTOLIN HFA) 108 (90 BASE) MCG/ACT inhaler Inhale 1-2 puffs into the lungs every 6 (six) hours as needed for wheezing or shortness of breath. 07/30/14   Harden Mo, MD  cephALEXin (KEFLEX) 500 MG capsule Take 1 capsule (500 mg total) by mouth 4 (four) times daily. Patient not taking: Reported on 10/07/2014 09/25/14   Janne Napoleon, NP  clonazePAM (KLONOPIN) 1 MG tablet Take 1 mg by mouth daily.     Historical Provider, MD  fluconazole (DIFLUCAN) 150 MG tablet Take 1 tablet (150 mg total) by mouth once. 08/20/14   Harden Mo, MD  fluticasone (FLONASE) 50 MCG/ACT nasal spray Place 2 sprays into both nostrils daily. 07/30/14   Harden Mo, MD  hydrocortisone cream 1 % Apply to rash on face TID 08/20/14   Harden Mo, MD  hydrOXYzine (ATARAX/VISTARIL) 25 MG tablet Take 1 tablet (25 mg total) by mouth every 6 (six) hours as needed for itching. 08/20/14   Harden Mo, MD  ibuprofen (ADVIL,MOTRIN)  400 MG tablet Take 1 tablet (400 mg total) by mouth every 6 (six) hours as needed. 07/13/14   Varney Biles, MD  ipratropium (ATROVENT) 0.06 % nasal spray Place 2 sprays into both nostrils 4 (four) times daily. 10/07/14   Lutricia Feil, PA  levonorgestrel-ethinyl estradiol (AUBRA) 0.1-20 MG-MCG tablet Take 1 tablet by mouth daily.    Historical Provider, MD  naproxen (NAPROSYN) 500 MG tablet Take 1 tablet (500 mg total) by mouth 2 (two) times daily. 07/12/14   Fredia Sorrow, MD  omeprazole (PRILOSEC) 20 MG capsule Take 1 capsule (20 mg total) by mouth daily. 04/01/14   Dahlia Bailiff, PA-C  ondansetron (ZOFRAN ODT) 4 MG disintegrating tablet Take 1 tablet (4 mg total) by mouth  every 8 (eight) hours as needed for nausea or vomiting. 07/12/14   Fredia Sorrow, MD  triamcinolone cream (KENALOG) 0.1 % Apply to rash on body TID 08/20/14   Harden Mo, MD   BP 138/85 mmHg  Pulse 87  Temp(Src) 98.7 F (37.1 C) (Oral)  Resp 16  SpO2 96% Physical Exam  Constitutional: She is oriented to person, place, and time. She appears well-developed and well-nourished. No distress.  HENT:  Head: Normocephalic and atraumatic.  Right Ear: Hearing, tympanic membrane, external ear and ear canal normal.  Left Ear: Hearing, tympanic membrane, external ear and ear canal normal.  Nose: Nose normal.  Mouth/Throat: Uvula is midline, oropharynx is clear and moist and mucous membranes are normal.  Eyes: Conjunctivae are normal. No scleral icterus.  Neck: Normal range of motion. Neck supple.  Cardiovascular: Normal rate, regular rhythm and normal heart sounds.   Pulmonary/Chest: Effort normal and breath sounds normal.  Musculoskeletal: Normal range of motion.  Lymphadenopathy:    She has no cervical adenopathy.  Neurological: She is alert and oriented to person, place, and time.  Skin: Skin is warm and dry.  Psychiatric: She has a normal mood and affect. Her behavior is normal.  Nursing note and vitals reviewed.   ED Course  Procedures (including critical care time) Labs Review Labs Reviewed  POCT RAPID STREP A ([facility] CARE ONLY)    Imaging Review No results found.   MDM   1. URI (upper respiratory infection)   2. Seasonal allergies     Rapid strep negative. Will notify patient if throat culture results indicate need for additional treatment.  I suspect you are experiencing a common cold that is compounding your existing seasonal allergy symptoms. Please continue Flonase and Claritin or Zyrtec. You may discontinue Sudafed and begin using Atrovent nasal spray as directed instead. If symptoms do not improve over the next 5-7 days, please follow up with your primary care  doctor.  It will take several days for your cold symptoms to improve   Lutricia Feil, PA 10/07/14 1408

## 2014-10-07 NOTE — Discharge Instructions (Signed)
I suspect you are experiencing a common cold that is compounding your existing seasonal allergy symptoms. Please continue Flonase and Claritin or Zyrtec. You may discontinue Sudafed and begin using Atrovent nasal spray as directed instead. If symptoms do not improve over the next 5-7 days, please follow up with your primary care doctor.  It will take several days for your cold symptoms to improve Allergic Rhinitis Allergic rhinitis is when the mucous membranes in the nose respond to allergens. Allergens are particles in the air that cause your body to have an allergic reaction. This causes you to release allergic antibodies. Through a chain of events, these eventually cause you to release histamine into the blood stream. Although meant to protect the body, it is this release of histamine that causes your discomfort, such as frequent sneezing, congestion, and an itchy, runny nose.  CAUSES  Seasonal allergic rhinitis (hay fever) is caused by pollen allergens that may come from grasses, trees, and weeds. Year-round allergic rhinitis (perennial allergic rhinitis) is caused by allergens such as house dust mites, pet dander, and mold spores.  SYMPTOMS   Nasal stuffiness (congestion).  Itchy, runny nose with sneezing and tearing of the eyes. DIAGNOSIS  Your health care provider can help you determine the allergen or allergens that trigger your symptoms. If you and your health care provider are unable to determine the allergen, skin or blood testing may be used. TREATMENT  Allergic rhinitis does not have a cure, but it can be controlled by:  Medicines and allergy shots (immunotherapy).  Avoiding the allergen. Hay fever may often be treated with antihistamines in pill or nasal spray forms. Antihistamines block the effects of histamine. There are over-the-counter medicines that may help with nasal congestion and swelling around the eyes. Check with your health care provider before taking or giving this  medicine.  If avoiding the allergen or the medicine prescribed do not work, there are many new medicines your health care provider can prescribe. Stronger medicine may be used if initial measures are ineffective. Desensitizing injections can be used if medicine and avoidance does not work. Desensitization is when a patient is given ongoing shots until the body becomes less sensitive to the allergen. Make sure you follow up with your health care provider if problems continue. HOME CARE INSTRUCTIONS It is not possible to completely avoid allergens, but you can reduce your symptoms by taking steps to limit your exposure to them. It helps to know exactly what you are allergic to so that you can avoid your specific triggers. SEEK MEDICAL CARE IF:   You have a fever.  You develop a cough that does not stop easily (persistent).  You have shortness of breath.  You start wheezing.  Symptoms interfere with normal daily activities. Document Released: 03/08/2001 Document Revised: 06/18/2013 Document Reviewed: 02/18/2013 [facility] Patient Information 2015 [place], [place]. This information is not intended to replace advice given to you by your health care provider. Make sure you discuss any questions you have with your health care provider.  Hay Fever Hay fever is an allergic reaction to particles in the air. It cannot be passed from person to person. It cannot be cured, but it can be controlled. CAUSES  Hay fever is caused by something that triggers an allergic reaction (allergens). The following are examples of allergens:  Ragweed.  Feathers.  Animal dander.  Grass and tree pollens.  Cigarette smoke.  House dust.  Pollution. SYMPTOMS   Sneezing.  Runny or stuffy nose.  Tearing eyes.  Itchy eyes, nose, mouth, throat, skin, or other area.  Sore throat.  Headache.  Decreased sense of smell or taste. DIAGNOSIS Your caregiver will perform a physical exam and ask questions about the  symptoms you are having.Allergy testing may be done to determine exactly what triggers your hay fever.  TREATMENT   Over-the-counter medicines may help symptoms. These include:  Antihistamines.  Decongestants. These may help with nasal congestion.  Your caregiver may prescribe medicines if over-the-counter medicines do not work.  Some people benefit from allergy shots when other medicines are not helpful. HOME CARE INSTRUCTIONS   Avoid the allergen that is causing your symptoms, if possible.  Take all medicine as told by your caregiver. SEEK MEDICAL CARE IF:   You have severe allergy symptoms and your current medicines are not helping.  Your treatment was working at one time, but you are now experiencing symptoms.  You have sinus congestion and pressure.  You develop a fever or headache.  You have thick nasal discharge.  You have asthma and have a worsening cough and wheezing. SEEK IMMEDIATE MEDICAL CARE IF:   You have swelling of your tongue or lips.  You have trouble breathing.  You feel lightheaded or like you are going to faint.  You have cold sweats.  You have a fever. Document Released: 06/13/2005 Document Revised: 09/05/2011 Document Reviewed: 09/08/2010 [facility] Patient Information 2015 [place], [place]. This information is not intended to replace advice given to you by your health care provider. Make sure you discuss any questions you have with your health care provider.  Upper Respiratory Infection, Adult An upper respiratory infection (URI) is also sometimes known as the common cold. The upper respiratory tract includes the nose, sinuses, throat, trachea, and bronchi. Bronchi are the airways leading to the lungs. Most people improve within 1 week, but symptoms can last up to 2 weeks. A residual cough may last even longer.  CAUSES Many different viruses can infect the tissues lining the upper respiratory tract. The tissues become irritated and inflamed and  often become very moist. Mucus production is also common. A cold is contagious. You can easily spread the virus to others by oral contact. This includes kissing, sharing a glass, coughing, or sneezing. Touching your mouth or nose and then touching a surface, which is then touched by another person, can also spread the virus. SYMPTOMS  Symptoms typically develop 1 to 3 days after you come in contact with a cold virus. Symptoms vary from person to person. They may include:  Runny nose.  Sneezing.  Nasal congestion.  Sinus irritation.  Sore throat.  Loss of voice (laryngitis).  Cough.  Fatigue.  Muscle aches.  Loss of appetite.  Headache.  Low-grade fever. DIAGNOSIS  You might diagnose your own cold based on familiar symptoms, since most people get a cold 2 to 3 times a year. Your caregiver can confirm this based on your exam. Most importantly, your caregiver can check that your symptoms are not due to another disease such as strep throat, sinusitis, pneumonia, asthma, or epiglottitis. Blood tests, throat tests, and X-rays are not necessary to diagnose a common cold, but they may sometimes be helpful in excluding other more serious diseases. Your caregiver will decide if any further tests are required. RISKS AND COMPLICATIONS  You may be at risk for a more severe case of the common cold if you smoke cigarettes, have chronic heart disease (such as heart failure) or lung disease (such as asthma), or if you have  a weakened immune system. The very young and very old are also at risk for more serious infections. Bacterial sinusitis, middle ear infections, and bacterial pneumonia can complicate the common cold. The common cold can worsen asthma and chronic obstructive pulmonary disease (COPD). Sometimes, these complications can require emergency medical care and may be life-threatening. PREVENTION  The best way to protect against getting a cold is to practice good hygiene. Avoid oral or hand  contact with people with cold symptoms. Wash your hands often if contact occurs. There is no clear evidence that vitamin C, vitamin E, echinacea, or exercise reduces the chance of developing a cold. However, it is always recommended to get plenty of rest and practice good nutrition. TREATMENT  Treatment is directed at relieving symptoms. There is no cure. Antibiotics are not effective, because the infection is caused by a virus, not by bacteria. Treatment may include:  Increased fluid intake. Sports drinks offer valuable electrolytes, sugars, and fluids.  Breathing heated mist or steam (vaporizer or shower).  Eating chicken soup or other clear broths, and maintaining good nutrition.  Getting plenty of rest.  Using gargles or lozenges for comfort.  Controlling fevers with ibuprofen or acetaminophen as directed by your caregiver.  Increasing usage of your inhaler if you have asthma. Zinc gel and zinc lozenges, taken in the first 24 hours of the common cold, can shorten the duration and lessen the severity of symptoms. Pain medicines may help with fever, muscle aches, and throat pain. A variety of non-prescription medicines are available to treat congestion and runny nose. Your caregiver can make recommendations and may suggest nasal or lung inhalers for other symptoms.  HOME CARE INSTRUCTIONS   Only take over-the-counter or prescription medicines for pain, discomfort, or fever as directed by your caregiver.  Use a warm mist humidifier or inhale steam from a shower to increase air moisture. This may keep secretions moist and make it easier to breathe.  Drink enough water and fluids to keep your urine clear or pale yellow.  Rest as needed.  Return to work when your temperature has returned to normal or as your caregiver advises. You may need to stay home longer to avoid infecting others. You can also use a face mask and careful hand washing to prevent spread of the virus. SEEK MEDICAL CARE  IF:   After the first few days, you feel you are getting worse rather than better.  You need your caregiver's advice about medicines to control symptoms.  You develop chills, worsening shortness of breath, or brown or red sputum. These may be signs of pneumonia.  You develop yellow or brown nasal discharge or pain in the face, especially when you bend forward. These may be signs of sinusitis.  You develop a fever, swollen neck glands, pain with swallowing, or white areas in the back of your throat. These may be signs of strep throat. SEEK IMMEDIATE MEDICAL CARE IF:   You have a fever.  You develop severe or persistent headache, ear pain, sinus pain, or chest pain.  You develop wheezing, a prolonged cough, cough up blood, or have a change in your usual mucus (if you have chronic lung disease).  You develop sore muscles or a stiff neck. Document Released: 12/07/2000 Document Revised: 09/05/2011 Document Reviewed: 09/18/2013 [facility] Patient Information 2015 [place], [place]. This information is not intended to replace advice given to you by your health care provider. Make sure you discuss any questions you have with your health care provider.

## 2014-10-09 LAB — CULTURE, GROUP A STREP: STREP A CULTURE: NEGATIVE

## 2014-10-29 ENCOUNTER — Ambulatory Visit: Payer: No Typology Code available for payment source | Attending: Internal Medicine | Admitting: Internal Medicine

## 2014-10-29 ENCOUNTER — Encounter: Payer: Self-pay | Admitting: Internal Medicine

## 2014-10-29 VITALS — BP 118/77 | HR 86 | Temp 98.0°F | Resp 16 | Ht 62.0 in | Wt 205.0 lb

## 2014-10-29 DIAGNOSIS — E049 Nontoxic goiter, unspecified: Secondary | ICD-10-CM | POA: Insufficient documentation

## 2014-10-29 DIAGNOSIS — E010 Iodine-deficiency related diffuse (endemic) goiter: Secondary | ICD-10-CM

## 2014-10-29 DIAGNOSIS — F419 Anxiety disorder, unspecified: Secondary | ICD-10-CM

## 2014-10-29 DIAGNOSIS — J321 Chronic frontal sinusitis: Secondary | ICD-10-CM | POA: Insufficient documentation

## 2014-10-29 LAB — BASIC METABOLIC PANEL
BUN: 13 mg/dL (ref 6–23)
CO2: 27 meq/L (ref 19–32)
CREATININE: 0.64 mg/dL (ref 0.50–1.10)
Calcium: 9 mg/dL (ref 8.4–10.5)
Chloride: 102 mEq/L (ref 96–112)
GLUCOSE: 90 mg/dL (ref 70–99)
Potassium: 4 mEq/L (ref 3.5–5.3)
SODIUM: 138 meq/L (ref 135–145)

## 2014-10-29 MED ORDER — CLONAZEPAM 0.5 MG PO TABS: 0.5000 mg | ORAL_TABLET | Freq: Every day | ORAL | Status: DC | PRN

## 2014-10-29 MED ORDER — AMOXICILLIN-POT CLAVULANATE 875-125 MG PO TABS: 1.0000 | ORAL_TABLET | Freq: Two times a day (BID) | ORAL | Status: DC

## 2014-10-29 MED ORDER — FLUCONAZOLE 150 MG PO TABS: 150.0000 mg | ORAL_TABLET | Freq: Once | ORAL | Status: DC

## 2014-10-29 NOTE — Progress Notes (Signed)
Patient ID: Christina Fox, female   DOB: [birth year], 34 y.o.   MRN: [id number]  [id number]:[id number]  WER:[id number]  DOB - 03-May-1981  CC:  Chief Complaint  Patient presents with  . Establish Care       HPI: Christina Fox is a 34 y.o. female here today to establish medical care. She has no past medical history other than thyromegaly. She feels like her neck is swollen but reports that she had a thyroid biopsy a couple of years ago that was negative. She has always had a normal TSH level. She notes that she has been having panic attacks for the past two years. She has been on paxil and zoloft in teh past but did not have any relief. She has been on Clonazepam for several years.  She has symptoms of shakiness, feeling "weird", and inability to function without clonazepam. Was told by her last PCP that she needs to take this medication daily to help with symptoms.   She does feel like her neck is swollen. She has been seen by Urgent Care several times for symptoms of snuffy nose, facial pressures, headaches, and green mucous. These symptoms began in March.She has tried nasal sprays and OTC allergy medications.   Patient has No headache, No chest pain, No abdominal pain - No Nausea, No new weakness tingling or numbness, No Cough - SOB.  Allergies  Allergen Reactions  . Sulfa Antibiotics Hives and Rash   Past Medical History  Diagnosis Date  . Headache(784.0)     sinus  . GERD (gastroesophageal reflux disease)   . Kidney stones     no current problem  . Anxiety   . Deviated septum 09/2011  . 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  . Complication of anesthesia     states small mouth  . Pelvic inflammatory disease    Current Outpatient Prescriptions on File Prior to Visit  Medication Sig Dispense Refill  . levonorgestrel-ethinyl estradiol (AUBRA) 0.1-20 MG-MCG tablet Take 1 tablet by mouth daily.    Marland Kitchen albuterol (PROVENTIL HFA;VENTOLIN HFA) 108 (90 BASE) MCG/ACT inhaler Inhale 1-2  puffs into the lungs every 6 (six) hours as needed for wheezing or shortness of breath. (Patient not taking: Reported on 10/29/2014) 1 Inhaler 0  . cephALEXin (KEFLEX) 500 MG capsule Take 1 capsule (500 mg total) by mouth 4 (four) times daily. (Patient not taking: Reported on 10/07/2014) 28 capsule 0  . clonazePAM (KLONOPIN) 1 MG tablet Take 1 mg by mouth daily.     . fluconazole (DIFLUCAN) 150 MG tablet Take 1 tablet (150 mg total) by mouth once. (Patient not taking: Reported on 10/29/2014) 1 tablet 5  . fluticasone (FLONASE) 50 MCG/ACT nasal spray Place 2 sprays into both nostrils daily. (Patient not taking: Reported on 10/29/2014) 16 g 0  . hydrocortisone cream 1 % Apply to rash on face TID (Patient not taking: Reported on 10/29/2014) 85.2 g 2  . hydrOXYzine (ATARAX/VISTARIL) 25 MG tablet Take 1 tablet (25 mg total) by mouth every 6 (six) hours as needed for itching. (Patient not taking: Reported on 10/29/2014) 30 tablet 0  . ibuprofen (ADVIL,MOTRIN) 400 MG tablet Take 1 tablet (400 mg total) by mouth every 6 (six) hours as needed. (Patient not taking: Reported on 10/29/2014) 30 tablet 0  . ipratropium (ATROVENT) 0.06 % nasal spray Place 2 sprays into both nostrils 4 (four) times daily. (Patient not taking: Reported on 10/29/2014) 15 mL 0  . naproxen (NAPROSYN) 500 MG tablet Take 1  tablet (500 mg total) by mouth 2 (two) times daily. (Patient not taking: Reported on 10/29/2014) 14 tablet 0  . omeprazole (PRILOSEC) 20 MG capsule Take 1 capsule (20 mg total) by mouth daily. (Patient not taking: Reported on 10/29/2014) 30 capsule 0  . ondansetron (ZOFRAN ODT) 4 MG disintegrating tablet Take 1 tablet (4 mg total) by mouth every 8 (eight) hours as needed for nausea or vomiting. (Patient not taking: Reported on 10/29/2014) 10 tablet 1  . triamcinolone cream (KENALOG) 0.1 % Apply to rash on body TID (Patient not taking: Reported on 10/29/2014) 80 g 2  . [DISCONTINUED] escitalopram (LEXAPRO) 10 MG tablet Take 10 mg by mouth daily.        No current facility-administered medications on file prior to visit.   Family History  Problem Relation Age of Onset  . Cancer Mother   . Hypertension Mother   . Hypertension Father   . Liver disease Father   . Cancer Brother   . Hypertension Other    History   Social History  . Marital Status: Single    Spouse Name: N/A  . Number of Children: N/A  . Years of Education: N/A   Occupational History  . Not on file.   Social History Main Topics  . Smoking status: Former Smoker -- 0.25 packs/day  . Smokeless tobacco: Never Used  . Alcohol Use: No  . Drug Use: No  . Sexual Activity: Yes    Birth Control/ Protection: Pill   Other Topics Concern  . Not on file   Social History Narrative    Review of Systems  Constitutional: Positive for chills. Negative for fever.  HENT: Positive for congestion. Negative for sore throat.   Respiratory: Negative.   Cardiovascular: Negative.   Neurological: Positive for headaches.  Psychiatric/Behavioral: Negative for suicidal ideas and substance abuse. The patient is nervous/anxious.   All other systems reviewed and are negative.    Objective:   Filed Vitals:   10/29/14 1655  BP: 118/77  Pulse: 86  Temp: 98 F (36.7 C)  Resp: 16    Physical Exam: Constitutional: Patient appears well-developed and well-nourished. No distress. HENT: Normocephalic, atraumatic, External right and left ear normal. Oropharynx is clear and moist.  Eyes: Conjunctivae and EOM are normal. PERRLA, no scleral icterus. Neck: Normal ROM. Neck supple. No JVD. No tracheal deviation. Thyromegaly. CVS: RRR, S1/S2 +, no murmurs, no gallops, no carotid bruit.  Pulmonary: Effort and breath sounds normal, no stridor, rhonchi, wheezes, rales.  Abdominal: Soft. BS +, no distension, tenderness, rebound or guarding.  Musculoskeletal: Normal range of motion. No edema and no tenderness.  Lymphadenopathy: No lymphadenopathy noted, cervical. Neuro: Alert. Normal  reflexes, muscle tone coordination. No cranial nerve deficit. Skin: Skin is warm and dry. No rash noted. Not diaphoretic. No erythema. No pallor. Psychiatric: Normal mood and affect. Behavior, judgment, thought content normal.  Lab Results  Component Value Date   WBC 12.1* 07/31/2014   HGB 12.9 07/31/2014   HCT 38.5 07/31/2014   MCV 89.1 07/31/2014   PLT 317 07/31/2014   Lab Results  Component Value Date   CREATININE 0.76 07/31/2014   BUN 11 07/31/2014   NA 138 07/31/2014   K 3.9 07/31/2014   CL 106 07/31/2014   CO2 23 07/31/2014    No results found for: HGBA1C Lipid Panel  No results found for: CHOL, TRIG, HDL, CHOLHDL, VLDL, LDLCALC     Assessment and plan:   Cortni was seen today for establish care.  Diagnoses and all orders for this visit:  Chronic frontal sinusitis Orders: -     Begin amoxicillin-clavulanate (AUGMENTIN) 875-125 MG per tablet; Take 1 tablet by mouth 2 (two) times daily. -    Begin fluconazole (DIFLUCAN) 150 MG tablet; Take 1 tablet (150 mg total) by mouth once. One tablet on day 5 and one tab at end of antibiotic course. To prevent candida infection   Thyromegaly Orders: -     TSH -     T4, Free -     Basic Metabolic Panel Will evaluate levels to make sure patient is not having symptoms of overactive thyroid   Anxiety disorder, unspecified anxiety disorder type Orders: -     clonazePAM (KLONOPIN) 0.5 MG tablet; Take 1 tablet (0.5 mg total) by mouth daily as needed for anxiety. Will refill one time, she will need to go to [facility] or [facility] for additional treatment relating to anxiety. Patient needs to be on SSRI but she reports that she does not do well with those type of medications  Return if symptoms worsen or fail to improve.      Chari Manning, NP-C [facility] [phone] 10/29/2014, 5:22 PM

## 2014-10-29 NOTE — Patient Instructions (Signed)
Generalized Anxiety Disorder Generalized anxiety disorder (GAD) is a mental disorder. It interferes with life functions, including relationships, work, and school. GAD is different from normal anxiety, which everyone experiences at some point in their lives in response to specific life events and activities. Normal anxiety actually helps us prepare for and get through these life events and activities. Normal anxiety goes away after the event or activity is over.  GAD causes anxiety that is not necessarily related to specific events or activities. It also causes excess anxiety in proportion to specific events or activities. The anxiety associated with GAD is also difficult to control. GAD can vary from mild to severe. People with severe GAD can have intense waves of anxiety with physical symptoms (panic attacks).  SYMPTOMS The anxiety and worry associated with GAD are difficult to control. This anxiety and worry are related to many life events and activities and also occur more days than not for 6 months or longer. People with GAD also have three or more of the following symptoms (one or more in children):  Restlessness.   Fatigue.  Difficulty concentrating.   Irritability.  Muscle tension.  Difficulty sleeping or unsatisfying sleep. DIAGNOSIS GAD is diagnosed through an assessment by your health care provider. Your health care provider will ask you questions aboutyour mood,physical symptoms, and events in your life. Your health care provider may ask you about your medical history and use of alcohol or drugs, including prescription medicines. Your health care provider may also do a physical exam and blood tests. Certain medical conditions and the use of certain substances can cause symptoms similar to those associated with GAD. Your health care provider may refer you to a mental health specialist for further evaluation. TREATMENT The following therapies are usually used to treat GAD:    Medication. Antidepressant medication usually is prescribed for long-term daily control. Antianxiety medicines may be added in severe cases, especially when panic attacks occur.   Talk therapy (psychotherapy). Certain types of talk therapy can be helpful in treating GAD by providing support, education, and guidance. A form of talk therapy called cognitive behavioral therapy can teach you healthy ways to think about and react to daily life events and activities.  Stress managementtechniques. These include yoga, meditation, and exercise and can be very helpful when they are practiced regularly. A mental health specialist can help determine which treatment is best for you. Some people see improvement with one therapy. However, other people require a combination of therapies. Document Released: 10/08/2012 Document Revised: 10/28/2013 Document Reviewed: 10/08/2012 Exit[employer] Patient Information 2015 [employer]. This information is not intended to replace advice given to you by your health care provider. Make sure you discuss any questions you have with your health care provider.  

## 2014-10-29 NOTE — Progress Notes (Signed)
Pt is here to establish care. Pt has a history of anxiety and she needs her medication refilled. Pt reports not being able to breath out her nose and she is coughing up green mucus. Pt states that her neck is very swollen.

## 2014-10-30 ENCOUNTER — Telehealth: Payer: Self-pay | Admitting: *Deleted

## 2014-10-30 LAB — TSH: TSH: 1.189 u[IU]/mL (ref 0.350–4.500)

## 2014-10-30 LAB — T4, FREE: Free T4: 0.92 ng/dL (ref 0.80–1.80)

## 2014-10-30 NOTE — Telephone Encounter (Signed)
Pt is aware of her lab results.  

## 2014-10-30 NOTE — Telephone Encounter (Signed)
-----   Message from Lance Bosch, NP sent at 10/30/2014  7:34 AM EDT ----- Thyroid is functioning fine at this point. We will check every year. Thyroid usually starts to cause problems closer to 35 years of age

## 2014-11-03 DIAGNOSIS — E010 Iodine-deficiency related diffuse (endemic) goiter: Secondary | ICD-10-CM | POA: Insufficient documentation

## 2014-11-03 DIAGNOSIS — F419 Anxiety disorder, unspecified: Secondary | ICD-10-CM | POA: Insufficient documentation

## 2014-11-26 ENCOUNTER — Ambulatory Visit: Payer: Self-pay | Attending: Family Medicine

## 2014-12-05 ENCOUNTER — Encounter: Payer: Self-pay | Admitting: *Deleted

## 2014-12-05 ENCOUNTER — Emergency Department: Payer: Self-pay

## 2014-12-05 ENCOUNTER — Emergency Department
Admission: EM | Admit: 2014-12-05 | Discharge: 2014-12-05 | Disposition: A | Discharge status: Home / Self Care | Payer: Self-pay | Attending: Emergency Medicine | Admitting: Emergency Medicine

## 2014-12-05 DIAGNOSIS — Z793 Long term (current) use of hormonal contraceptives: Secondary | ICD-10-CM | POA: Insufficient documentation

## 2014-12-05 DIAGNOSIS — Z3202 Encounter for pregnancy test, result negative: Secondary | ICD-10-CM | POA: Insufficient documentation

## 2014-12-05 DIAGNOSIS — J302 Other seasonal allergic rhinitis: Secondary | ICD-10-CM | POA: Insufficient documentation

## 2014-12-05 DIAGNOSIS — Z87891 Personal history of nicotine dependence: Secondary | ICD-10-CM | POA: Insufficient documentation

## 2014-12-05 DIAGNOSIS — Z7951 Long term (current) use of inhaled steroids: Secondary | ICD-10-CM | POA: Insufficient documentation

## 2014-12-05 DIAGNOSIS — Z79899 Other long term (current) drug therapy: Secondary | ICD-10-CM | POA: Insufficient documentation

## 2014-12-05 DIAGNOSIS — Z792 Long term (current) use of antibiotics: Secondary | ICD-10-CM | POA: Insufficient documentation

## 2014-12-05 DIAGNOSIS — J029 Acute pharyngitis, unspecified: Secondary | ICD-10-CM

## 2014-12-05 LAB — URINALYSIS COMPLETE WITH MICROSCOPIC (ARMC ONLY)
Bacteria, UA: NONE SEEN
Bilirubin Urine: NEGATIVE
Glucose, UA: NEGATIVE mg/dL
Ketones, ur: NEGATIVE mg/dL
NITRITE: NEGATIVE
Protein, ur: NEGATIVE mg/dL
SPECIFIC GRAVITY, URINE: 1.014 (ref 1.005–1.030)
pH: 6 (ref 5.0–8.0)

## 2014-12-05 LAB — POCT PREGNANCY, URINE: PREG TEST UR: NEGATIVE

## 2014-12-05 MED ORDER — MONTELUKAST SODIUM 10 MG PO TABS: 10.0000 mg | ORAL_TABLET | Freq: Every day | ORAL | Status: DC

## 2014-12-05 NOTE — ED Notes (Signed)
Pt states that her throat hurts and it started last night and she has been feeling bad all day long. She states it hurts swallowing.

## 2014-12-05 NOTE — ED Notes (Signed)
C/o soretrhroat. Tenderness to base of right neck

## 2014-12-05 NOTE — ED Provider Notes (Signed)
[facility] Emergency Department Provider Note  ____________________________________________  Time seen: 1745  I have reviewed the triage vital signs and the nursing notes.   HISTORY  Chief Complaint Sore Throat   HPI Christina Fox is a 34 y.o. female complains of sore throat times one day. She states she has been feeling bad all day long. Swallowing increases her pain. She is unaware of any fever currently she is taking her allergy medicine which includes Flonase, Atrovent nasal sprayClaritin and ibuprofen. She states these are not helping any. Pain is worse when she swallows. Currently she rates her pain a 10 out of 10. She also has developed a green productive cough today.  She also complains of urinary frequency. Along with her other complaints.   Past Medical History  Diagnosis Date  . Headache(784.0)     sinus  . GERD (gastroesophageal reflux disease)   . Kidney stones     no current problem  . Anxiety   . Deviated septum 09/2011  . 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  . Complication of anesthesia     states small mouth  . Pelvic inflammatory disease     Patient Active Problem List   Diagnosis Date Noted  . 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 Anxiety disorder 11/03/2014    Past Surgical History  Procedure Laterality Date  . Polyps removed      colon  . Wisdom tooth extraction    . Nasal septoplasty w/ turbinoplasty  10/04/2011    Procedure: NASAL SEPTOPLASTY WITH TURBINATE REDUCTION;  Surgeon: Ascencion Dike, MD;  Location: [place];  Service: ENT;  Laterality: Bilateral;  . Tonsillectomy      Current Outpatient Rx  Name  Route  Sig  Dispense  Refill  . albuterol (PROVENTIL HFA;VENTOLIN HFA) 108 (90 BASE) MCG/ACT inhaler   Inhalation   Inhale 1-2 puffs into the lungs every 6 (six) hours as needed for wheezing or shortness of breath. Patient not taking: Reported on 10/29/2014   1 Inhaler   0   .  amoxicillin-clavulanate (AUGMENTIN) 875-125 MG per tablet   Oral   Take 1 tablet by mouth 2 (two) times daily.   20 tablet   0   . cephALEXin (KEFLEX) 500 MG capsule   Oral   Take 1 capsule (500 mg total) by mouth 4 (four) times daily. Patient not taking: Reported on 10/07/2014   28 capsule   0   . clonazePAM (KLONOPIN) 0.5 MG tablet   Oral   Take 1 tablet (0.5 mg total) by mouth daily as needed for anxiety.   30 tablet   0   . clonazePAM (KLONOPIN) 1 MG tablet   Oral   Take 1 mg by mouth daily.          . fluconazole (DIFLUCAN) 150 MG tablet   Oral   Take 1 tablet (150 mg total) by mouth once. One tablet on day 5 and one tab at end of antibiotic course   2 tablet   0   . fluticasone (FLONASE) 50 MCG/ACT nasal spray   Each Nare   Place 2 sprays into both nostrils daily. Patient not taking: Reported on 10/29/2014   16 g   0   . hydrOXYzine (ATARAX/VISTARIL) 25 MG tablet   Oral   Take 1 tablet (25 mg total) by mouth every 6 (six) hours as needed for itching. Patient not taking: Reported on 10/29/2014   30 tablet   0   .  ipratropium (ATROVENT) 0.06 % nasal spray   Each Nare   Place 2 sprays into both nostrils 4 (four) times daily. Patient not taking: Reported on 10/29/2014   15 mL   0   . levonorgestrel-ethinyl estradiol (AUBRA) 0.1-20 MG-MCG tablet   Oral   Take 1 tablet by mouth daily.         . montelukast (SINGULAIR) 10 MG tablet   Oral   Take 1 tablet (10 mg total) by mouth daily.   30 tablet   2     Allergies Sulfa antibiotics  Family History  Problem Relation Age of Onset  . Cancer Mother   . Hypertension Mother   . Hypertension Father   . Liver disease Father   . Cancer Brother   . Hypertension Other     Social History History  Substance Use Topics  . Smoking status: Former Smoker -- 0.25 packs/day  . Smokeless tobacco: Never Used  . Alcohol Use: No    Review of Systems Constitutional: No fever/positive chills Eyes: No visual  changes. ENT: Positive sore throat. Cardiovascular: Denies chest pain. Respiratory: Denies shortness of breath. Productive cough Gastrointestinal: No abdominal pain.  No nausea, no vomiting.  No diarrhea.  No constipation. Genitourinary: Negative for dysuria. Urinary frequency Musculoskeletal: Negative for back pain. Skin: Negative for rash. Neurological: Negative for headaches, focal weakness or numbness.  10-point ROS otherwise negative.  ____________________________________________   PHYSICAL EXAM:  VITAL SIGNS: ED Triage Vitals  Enc Vitals Group     BP 12/05/14 1703 135/78 mmHg     Pulse Rate 12/05/14 1703 92     Resp 12/05/14 1703 20     Temp 12/05/14 1700 98.6 F (37 C)     Temp Source 12/05/14 1700 Oral     SpO2 --      Weight 12/05/14 1700 205 lb (92.987 kg)     Height 12/05/14 1700 5\' 2"  (1.575 m)     Head Cir --      Peak Flow --      Pain Score 12/05/14 1700 10     Pain Loc --      Pain Edu? --      Excl. in [place]? --     Constitutional: Alert and oriented. Well appearing and in no acute distress. Eyes: Conjunctivae are normal. PERRL. EOMI. Head: Atraumatic. Nose: No congestion/rhinnorhea. Membranes are pale and moist. Mouth/Throat: Mucous membranes are moist.  Oropharynx non-erythematous. Posterior pharynx moderate injection and posterior drainage. Neck: No stridor.   Hematological/Lymphatic/Immunilogical: No cervical lymphadenopathy. Cardiovascular: Normal rate, regular rhythm. Grossly normal heart sounds.  Good peripheral circulation. Respiratory: Normal respiratory effort.  No retractions. Lungs CTAB. Gastrointestinal: Soft and nontender. No distention. No abdominal bruits. No CVA tenderness. Musculoskeletal: No lower extremity tenderness nor edema.  No joint effusions. Neurologic:  Normal speech and language. No gross focal neurologic deficits are appreciated. Speech is normal. No gait instability. Skin:  Skin is warm, dry and intact. No rash  noted. Psychiatric: Mood and affect are normal. Speech and behavior are normal.  ____________________________________________   LABS (all labs ordered are listed, but only abnormal results are displayed)  Labs Reviewed  URINALYSIS COMPLETEWITH MICROSCOPIC ([facility] ONLY) - Abnormal; Notable for the following:    Color, Urine YELLOW (*)    APPearance CLEAR (*)    Hgb urine dipstick 1+ (*)    Leukocytes, UA TRACE (*)    Squamous Epithelial / LPF 0-5 (*)    All other components within normal limits  POC URINE PREG, ED  POCT PREGNANCY, URINE   ____________________________________________  EKG  Deferred ____________________________________________  RADIOLOGY  No acute process noted per radiologist and reviewed by me ____________________________________________   PROCEDURES  Procedure(s) performed: None  Critical Care performed: No  ____________________________________________   INITIAL IMPRESSION / ASSESSMENT AND PLAN / ED COURSE  Pertinent labs & imaging results that were available during my care of the patient were reviewed by me and considered in my medical decision making (see chart for details).  Patient is currently on multiple medications for her allergies. She has never tried Singulair, therefore a prescription was written. She is to follow-up with her doctor in [place] if any continued problems. ____________________________________________   FINAL CLINICAL IMPRESSION(S) / ED DIAGNOSES  Final diagnoses:  Seasonal allergies  Allergic pharyngitis      Johnn Hai, PA-C 12/05/14 1907  Harvest Dark, MD 12/05/14 2326

## 2014-12-05 NOTE — Discharge Instructions (Signed)
Allergies  Allergies may happen from anything your body is sensitive to. This may be food, medicines, pollens, chemicals, and many other things. Food allergies can be severe and deadly.  HOME CARE  If you do not know what causes a reaction, keep a diary. Write down the foods you ate and the symptoms that followed. Avoid foods that cause reactions.  If you have red raised spots (hives) or a rash:  Take medicine as told by your doctor.  Use medicines for red raised spots and itching as needed.  Apply cold cloths (compresses) to the skin. Take a cool bath. Avoid hot baths or showers.  If you are severely allergic:  It is often necessary to go to the hospital after you have treated your reaction.  Wear your medical alert jewelry.  You and your family must learn how to give a allergy shot or use an allergy kit (anaphylaxis kit).  Always carry your allergy kit or shot with you. Use this medicine as told by your doctor if a severe reaction is occurring. GET HELP RIGHT AWAY IF:  You have trouble breathing or are making high-pitched whistling sounds (wheezing).  You have a tight feeling in your chest or throat.  You have a puffy (swollen) mouth.  You have red raised spots, puffiness (swelling), or itching all over your body.  You have had a severe reaction that was helped by your allergy kit or shot. The reaction can return once the medicine has worn off.  You think you are having a food allergy. Symptoms most often happen within 30 minutes of eating a food.  Your symptoms have not gone away within 2 days or are getting worse.  You have new symptoms.  You want to retest yourself with a food or drink you think causes an allergic reaction. Only do this under the care of a doctor. MAKE SURE YOU:   Understand these instructions.  Will watch your condition.  Will get help right away if you are not doing well or get worse. Document Released: 10/08/2012 Document Reviewed:  10/08/2012 Exit[employer] Patient Information 2015 [employer]. This information is not intended to replace advice given to you by your health care provider. Make sure you discuss any questions you have with your health care provider.  

## 2014-12-19 ENCOUNTER — Encounter (HOSPITAL_COMMUNITY): Payer: Self-pay | Admitting: Emergency Medicine

## 2014-12-19 ENCOUNTER — Emergency Department (INDEPENDENT_AMBULATORY_CARE_PROVIDER_SITE_OTHER)
Admission: EM | Admit: 2014-12-19 | Discharge: 2014-12-19 | Disposition: A | Discharge status: Home / Self Care | Payer: Self-pay | Source: Home / Self Care | Attending: Family Medicine | Admitting: Family Medicine

## 2014-12-19 DIAGNOSIS — J0100 Acute maxillary sinusitis, unspecified: Secondary | ICD-10-CM

## 2014-12-19 LAB — POCT RAPID STREP A: Streptococcus, Group A Screen (Direct): NEGATIVE

## 2014-12-19 MED ORDER — IPRATROPIUM BROMIDE 0.06 % NA SOLN: 2.0000 | Freq: Four times a day (QID) | NASAL | Status: DC

## 2014-12-19 MED ORDER — AMOXICILLIN-POT CLAVULANATE 875-125 MG PO TABS: 1.0000 | ORAL_TABLET | Freq: Two times a day (BID) | ORAL | Status: DC

## 2014-12-19 NOTE — ED Notes (Signed)
Placed throat swab in lab

## 2014-12-19 NOTE — ED Provider Notes (Signed)
CSN: [id number]     Arrival date & time 12/19/14  1757 History   First MD Initiated Contact with Patient 12/19/14 1841     Chief Complaint  Patient presents with  . Sore Throat  . URI   (Consider location/radiation/quality/duration/timing/severity/associated sxs/prior Treatment) Patient is a 34 y.o. female presenting with pharyngitis. The history is provided by the patient.  Sore Throat This is a recurrent problem. The current episode started more than 1 week ago (seen 6/10 at [facility] and dx with seasonal allergies, sx continue inspite of meds.). The problem has been gradually worsening.    Past Medical History  Diagnosis Date  . Headache(784.0)     sinus  . GERD (gastroesophageal reflux disease)   . Kidney stones     no current problem  . Anxiety   . Deviated septum 09/2011  . 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  . Complication of anesthesia     states small mouth  . Pelvic inflammatory disease    Past Surgical History  Procedure Laterality Date  . Polyps removed      colon  . Wisdom tooth extraction    . Nasal septoplasty w/ turbinoplasty  10/04/2011    Procedure: NASAL SEPTOPLASTY WITH TURBINATE REDUCTION;  Surgeon: Ascencion Dike, MD;  Location: [facility];  Service: ENT;  Laterality: Bilateral;  . Tonsillectomy     Family History  Problem Relation Age of Onset  . Cancer Mother   . Hypertension Mother   . Hypertension Father   . Liver disease Father   . Cancer Brother   . Hypertension Other    History  Substance Use Topics  . Smoking status: Former Smoker -- 0.25 packs/day  . Smokeless tobacco: Never Used  . Alcohol Use: No   OB History    Gravida Para Term Preterm AB TAB SAB Ectopic Multiple Living   4 4 4       4      Review of Systems  Constitutional: Positive for fever, chills and appetite change.  HENT: Positive for congestion, postnasal drip, rhinorrhea and sore throat.   Respiratory: Negative for cough.   Cardiovascular: Negative.    Gastrointestinal: Negative.   Musculoskeletal: Positive for myalgias.    Allergies  Sulfa antibiotics  Home Medications   Prior to Admission medications   Medication Sig Start Date End Date Taking? Authorizing Provider  albuterol (PROVENTIL HFA;VENTOLIN HFA) 108 (90 BASE) MCG/ACT inhaler Inhale 1-2 puffs into the lungs every 6 (six) hours as needed for wheezing or shortness of breath. Patient not taking: Reported on 10/29/2014 07/30/14   Harden Mo, MD  amoxicillin-clavulanate (AUGMENTIN) 875-125 MG per tablet Take 1 tablet by mouth 2 (two) times daily. 12/19/14   Billy Fischer, MD  cephALEXin (KEFLEX) 500 MG capsule Take 1 capsule (500 mg total) by mouth 4 (four) times daily. Patient not taking: Reported on 10/07/2014 09/25/14   Janne Napoleon, NP  clonazePAM (KLONOPIN) 0.5 MG tablet Take 1 tablet (0.5 mg total) by mouth daily as needed for anxiety. 10/29/14   Lance Bosch, NP  clonazePAM (KLONOPIN) 1 MG tablet Take 1 mg by mouth daily.     Historical Provider, MD  fluconazole (DIFLUCAN) 150 MG tablet Take 1 tablet (150 mg total) by mouth once. One tablet on day 5 and one tab at end of antibiotic course 10/29/14   Lance Bosch, NP  fluticasone [facility]) 50 MCG/ACT nasal spray Place 2 sprays into both nostrils daily. Patient not  taking: Reported on 10/29/2014 07/30/14   Harden Mo, MD  hydrOXYzine (ATARAX/VISTARIL) 25 MG tablet Take 1 tablet (25 mg total) by mouth every 6 (six) hours as needed for itching. Patient not taking: Reported on 10/29/2014 08/20/14   Harden Mo, MD  ipratropium (ATROVENT) 0.06 % nasal spray Place 2 sprays into both nostrils 4 (four) times daily. 12/19/14   Billy Fischer, MD  levonorgestrel-ethinyl estradiol (AUBRA) 0.1-20 MG-MCG tablet Take 1 tablet by mouth daily.    Historical Provider, MD  montelukast (SINGULAIR) 10 MG tablet Take 1 tablet (10 mg total) by mouth daily. 12/05/14 12/05/15  Johnn Hai, PA-C   BP 139/87 mmHg  Pulse 100  Temp(Src) 99.1 F (37.3 C)  (Oral)  SpO2 100%  LMP  (LMP Unknown) Physical Exam  Constitutional: She is oriented to person, place, and time. She appears well-developed and well-nourished. No distress.  HENT:  Right Ear: External ear normal.  Left Ear: External ear normal.  Mouth/Throat: Uvula is midline and mucous membranes are normal. No uvula swelling. Posterior oropharyngeal erythema present. No oropharyngeal exudate or posterior oropharyngeal edema.  Eyes: Pupils are equal, round, and reactive to light.  Neck: Normal range of motion. Neck supple.  Cardiovascular: Normal rate, regular rhythm, normal heart sounds and intact distal pulses.   Pulmonary/Chest: Effort normal and breath sounds normal.  Lymphadenopathy:    She has cervical adenopathy.  Neurological: She is alert and oriented to person, place, and time.  Skin: Skin is warm and dry.  Nursing note and vitals reviewed.   ED Course  Procedures (including critical care time) Labs Review Labs Reviewed  POCT RAPID STREP A   Rapid strep neg. Imaging Review No results found.   MDM   1. Acute maxillary sinusitis, recurrence not specified       Billy Fischer, MD 12/19/14 1911

## 2014-12-19 NOTE — ED Notes (Signed)
Stuffy nose, sore throat, body aches-symptoms for 2 weeks

## 2014-12-19 NOTE — Discharge Instructions (Signed)
Drink plenty of fluids as discussed, use medicine as prescribed, and mucinex or delsym for cough. Return or see your doctor if further problems °

## 2014-12-22 LAB — CULTURE, GROUP A STREP: Strep A Culture: NEGATIVE

## 2014-12-22 NOTE — ED Notes (Signed)
Final report of strep negative  

## 2014-12-23 ENCOUNTER — Encounter (HOSPITAL_BASED_OUTPATIENT_CLINIC_OR_DEPARTMENT_OTHER): Payer: Self-pay | Admitting: Emergency Medicine

## 2014-12-23 ENCOUNTER — Emergency Department (HOSPITAL_BASED_OUTPATIENT_CLINIC_OR_DEPARTMENT_OTHER)
Admission: EM | Admit: 2014-12-23 | Discharge: 2014-12-23 | Disposition: A | Discharge status: Home / Self Care | Payer: Self-pay | Attending: Emergency Medicine | Admitting: Emergency Medicine

## 2014-12-23 DIAGNOSIS — Z7951 Long term (current) use of inhaled steroids: Secondary | ICD-10-CM | POA: Insufficient documentation

## 2014-12-23 DIAGNOSIS — M62830 Muscle spasm of back: Secondary | ICD-10-CM | POA: Insufficient documentation

## 2014-12-23 DIAGNOSIS — Z8742 Personal history of other diseases of the female genital tract: Secondary | ICD-10-CM | POA: Insufficient documentation

## 2014-12-23 DIAGNOSIS — Z8709 Personal history of other diseases of the respiratory system: Secondary | ICD-10-CM | POA: Insufficient documentation

## 2014-12-23 DIAGNOSIS — Z8719 Personal history of other diseases of the digestive system: Secondary | ICD-10-CM | POA: Insufficient documentation

## 2014-12-23 DIAGNOSIS — Z87442 Personal history of urinary calculi: Secondary | ICD-10-CM | POA: Insufficient documentation

## 2014-12-23 DIAGNOSIS — Z87891 Personal history of nicotine dependence: Secondary | ICD-10-CM | POA: Insufficient documentation

## 2014-12-23 DIAGNOSIS — M545 Low back pain, unspecified: Secondary | ICD-10-CM

## 2014-12-23 DIAGNOSIS — F419 Anxiety disorder, unspecified: Secondary | ICD-10-CM | POA: Insufficient documentation

## 2014-12-23 DIAGNOSIS — Z3202 Encounter for pregnancy test, result negative: Secondary | ICD-10-CM | POA: Insufficient documentation

## 2014-12-23 DIAGNOSIS — Z792 Long term (current) use of antibiotics: Secondary | ICD-10-CM | POA: Insufficient documentation

## 2014-12-23 DIAGNOSIS — Z79899 Other long term (current) drug therapy: Secondary | ICD-10-CM | POA: Insufficient documentation

## 2014-12-23 DIAGNOSIS — Z793 Long term (current) use of hormonal contraceptives: Secondary | ICD-10-CM | POA: Insufficient documentation

## 2014-12-23 LAB — URINALYSIS, ROUTINE W REFLEX MICROSCOPIC
BILIRUBIN URINE: NEGATIVE
Glucose, UA: NEGATIVE mg/dL
HGB URINE DIPSTICK: NEGATIVE
KETONES UR: NEGATIVE mg/dL
NITRITE: NEGATIVE
Protein, ur: NEGATIVE mg/dL
Specific Gravity, Urine: 1.011 (ref 1.005–1.030)
Urobilinogen, UA: 0.2 mg/dL (ref 0.0–1.0)
pH: 7.5 (ref 5.0–8.0)

## 2014-12-23 LAB — URINE MICROSCOPIC-ADD ON

## 2014-12-23 LAB — PREGNANCY, URINE: PREG TEST UR: NEGATIVE

## 2014-12-23 MED ORDER — IBUPROFEN 400 MG PO TABS
600.0000 mg | ORAL_TABLET | Freq: Once | ORAL | Status: AC
  Administered 2014-12-23: 600 mg via ORAL
  Filled 2014-12-23 (×2): qty 1

## 2014-12-23 MED ORDER — CYCLOBENZAPRINE HCL 10 MG PO TABS
5.0000 mg | ORAL_TABLET | Freq: Once | ORAL | Status: AC
  Administered 2014-12-23: 5 mg via ORAL
  Filled 2014-12-23: qty 1

## 2014-12-23 MED ORDER — CYCLOBENZAPRINE HCL 5 MG PO TABS: 5.0000 mg | ORAL_TABLET | Freq: Three times a day (TID) | ORAL | Status: DC | PRN

## 2014-12-23 NOTE — ED Notes (Signed)
34 yo with left sided back pain. States it feels like a balled up constant pain in the back. HX of kidney stones.

## 2014-12-23 NOTE — ED Provider Notes (Signed)
CSN: [id number]     Arrival date & time 12/23/14  2030 History  This chart was scribed for Serita Grit, MD by Meriel Pica, ED Scribe. This patient was seen in room MH03/MH03 and the patient's care was started 9:47 PM.    Chief Complaint  Patient presents with  . Flank Pain   Patient is a 34 y.o. female presenting with flank pain and anxiety. The history is provided by the patient. No language interpreter was used.  Flank Pain This is a new problem. The current episode started 12 to 24 hours ago. The problem occurs constantly. The problem has been gradually worsening. Pertinent negatives include no abdominal pain. The symptoms are aggravated by walking, twisting and standing. Nothing relieves the symptoms. Treatments tried: ibuprofen  The treatment provided no relief.  Anxiety This is a recurrent problem. The problem has been gradually worsening. Pertinent negatives include no abdominal pain. The symptoms are aggravated by stress. Nothing relieves the symptoms. She has tried nothing for the symptoms. The treatment provided no relief.    HPI Comments: Christina Fox is a 34 y.o. female, with a PMhx of GERD, renal calculi, anxiety, and pelvic inflammatory disease, who presents to the Emergency Department complaining of gradually worsening, waxing and waning, moderate left flank pain that radiates to her mid back onset 1 day ago. Pt reports the pain worsened this morning. She notes exacerbation of the pain in certain positions (sitting up), twisting, standing straight up, and ambulation. She works at the [employer] of a [employer] and denies any heavy lifting at work. She has taken ibuprofen with mild relief. She reports she is taking Augmentin for a recently diagnosed sinus infection. Pt is on oral birth control medication and states she does not have a normal monthly menstrual. She denies any abnormal urinary symptoms, hematuria, dysuria, vaginal discharge or bleeding, numbness or tingling in her  groin or LE, bladder or bowel incontinence, abdominal pain, cough, or fever.   Pt additionally mentions gradually worsening anxiety and associated trouble sleeping at night despite taking melatonin. Her friend attributed the increase in the pt's anxiety and stress to pt's recent back pain.   Past Medical History  Diagnosis Date  . Headache(784.0)     sinus  . GERD (gastroesophageal reflux disease)   . Kidney stones     no current problem  . Anxiety   . Deviated septum 09/2011  . 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  . Complication of anesthesia     states small mouth  . Pelvic inflammatory disease    Past Surgical History  Procedure Laterality Date  . Polyps removed      colon  . Wisdom tooth extraction    . Nasal septoplasty w/ turbinoplasty  10/04/2011    Procedure: NASAL SEPTOPLASTY WITH TURBINATE REDUCTION;  Surgeon: Ascencion Dike, MD;  Location: [facility];  Service: ENT;  Laterality: Bilateral;  . Tonsillectomy     Family History  Problem Relation Age of Onset  . Cancer Mother   . Hypertension Mother   . Hypertension Father   . Liver disease Father   . Cancer Brother   . Hypertension Other    History  Substance Use Topics  . Smoking status: Former Smoker -- 0.25 packs/day  . Smokeless tobacco: Never Used  . Alcohol Use: No   OB History    Gravida Para Term Preterm AB TAB SAB Ectopic Multiple Living   4 4 4  4     Review of Systems  Constitutional: Negative for fever.  Respiratory: Negative for cough.   Gastrointestinal: Negative for abdominal pain.  Genitourinary: Positive for flank pain. Negative for dysuria, urgency, hematuria, decreased urine volume, vaginal bleeding, vaginal discharge, vaginal pain and pelvic pain.  Musculoskeletal: Positive for back pain and arthralgias.  Neurological: Negative for numbness.  All other systems reviewed and are negative.  Allergies  Sulfa antibiotics  Home Medications   Prior to Admission  medications   Medication Sig Start Date End Date Taking? Authorizing Provider  amoxicillin-clavulanate (AUGMENTIN) 875-125 MG per tablet Take 1 tablet by mouth 2 (two) times daily. 12/19/14  Yes Billy Fischer, MD  albuterol (PROVENTIL HFA;VENTOLIN HFA) 108 (90 BASE) MCG/ACT inhaler Inhale 1-2 puffs into the lungs every 6 (six) hours as needed for wheezing or shortness of breath. Patient not taking: Reported on 10/29/2014 07/30/14   Harden Mo, MD  cephALEXin (KEFLEX) 500 MG capsule Take 1 capsule (500 mg total) by mouth 4 (four) times daily. Patient not taking: Reported on 10/07/2014 09/25/14   Janne Napoleon, NP  clonazePAM (KLONOPIN) 0.5 MG tablet Take 1 tablet (0.5 mg total) by mouth daily as needed for anxiety. 10/29/14   Lance Bosch, NP  clonazePAM (KLONOPIN) 1 MG tablet Take 1 mg by mouth daily.     Historical Provider, MD  fluconazole (DIFLUCAN) 150 MG tablet Take 1 tablet (150 mg total) by mouth once. One tablet on day 5 and one tab at end of antibiotic course 10/29/14   Lance Bosch, NP  fluticasone [facility]) 50 MCG/ACT nasal spray Place 2 sprays into both nostrils daily. Patient not taking: Reported on 10/29/2014 07/30/14   Harden Mo, MD  hydrOXYzine (ATARAX/VISTARIL) 25 MG tablet Take 1 tablet (25 mg total) by mouth every 6 (six) hours as needed for itching. Patient not taking: Reported on 10/29/2014 08/20/14   Harden Mo, MD  ipratropium (ATROVENT) 0.06 % nasal spray Place 2 sprays into both nostrils 4 (four) times daily. 12/19/14   Billy Fischer, MD  levonorgestrel-ethinyl estradiol (AUBRA) 0.1-20 MG-MCG tablet Take 1 tablet by mouth daily.    Historical Provider, MD  montelukast (SINGULAIR) 10 MG tablet Take 1 tablet (10 mg total) by mouth daily. 12/05/14 12/05/15  Johnn Hai, PA-C   BP 134/88 mmHg  Pulse 91  Temp(Src) 97.9 F (36.6 C) (Oral)  Resp 18  Ht 5\' 2"  (1.575 m)  Wt 202 lb (91.627 kg)  BMI 36.94 kg/m2  SpO2 100%  LMP  (LMP Unknown) Physical Exam  Constitutional: She  is oriented to person, place, and time. She appears well-developed and well-nourished. No distress.  HENT:  Head: Normocephalic and atraumatic.  Mouth/Throat: Oropharynx is clear and moist.  Eyes: Conjunctivae are normal. Pupils are equal, round, and reactive to light. No scleral icterus.  Neck: Neck supple.  Cardiovascular: Normal rate, regular rhythm, normal heart sounds and intact distal pulses.   No murmur heard. Pulmonary/Chest: Effort normal and breath sounds normal. No stridor. No respiratory distress. She has no rales.  Abdominal: Soft. Bowel sounds are normal. She exhibits no distension. There is no tenderness. There is no rebound and no guarding.  Musculoskeletal: Normal range of motion.       Lumbar back: She exhibits tenderness and spasm (left upper lumbar). She exhibits no bony tenderness.       Back:  Neurological: She is alert and oriented to person, place, and time.  Normal lower extremity strength and  sensation  Skin: Skin is warm and dry. No rash noted.  Psychiatric: She has a normal mood and affect. Her behavior is normal.  Nursing note and vitals reviewed.   ED Course  Procedures  DIAGNOSTIC STUDIES: Oxygen Saturation is 100% on RA, normal by my interpretation.    COORDINATION OF CARE: 9:57 PM Discussed treatment plan which includes to order and prescribe ibuprofen and Flexeril. Discussed unremarkable urinalysis results. Advised light stretching and the use of a heating pad intermittently as needed. Pt acknowledges and agrees to plan.   Labs Review Labs Reviewed  URINALYSIS, ROUTINE W REFLEX MICROSCOPIC (NOT AT [facility]) - Abnormal; Notable for the following:    Leukocytes, UA TRACE (*)    All other components within normal limits  PREGNANCY, URINE  URINE MICROSCOPIC-ADD ON    Imaging Review No results found.   EKG Interpretation None      MDM   Final diagnoses:  Back spasm  Left-sided low back pain without sciatica    33 yo female with left  flank/mid back pain.  History and exam unlikely to represent kidney stone.  Has palpable muscle spasm, likely cause of her pain.  UA and UPT negative.  Plan to treat with flexeril and NSAIDs.    I personally performed the services described in this documentation, which was scribed in my presence. The recorded information has been reviewed and is accurate.     Serita Grit, MD 12/23/14 [phone]

## 2014-12-30 ENCOUNTER — Inpatient Hospital Stay (HOSPITAL_COMMUNITY)
Admission: AD | Admit: 2014-12-30 | Discharge: 2014-12-30 | Disposition: A | Discharge status: Home / Self Care | Payer: Self-pay | Source: Ambulatory Visit | Attending: Family Medicine | Admitting: Family Medicine

## 2014-12-30 ENCOUNTER — Encounter (HOSPITAL_COMMUNITY): Payer: Self-pay

## 2014-12-30 DIAGNOSIS — B081 Molluscum contagiosum: Secondary | ICD-10-CM

## 2014-12-30 DIAGNOSIS — Z87891 Personal history of nicotine dependence: Secondary | ICD-10-CM | POA: Insufficient documentation

## 2014-12-30 DIAGNOSIS — Z113 Encounter for screening for infections with a predominantly sexual mode of transmission: Secondary | ICD-10-CM | POA: Insufficient documentation

## 2014-12-30 DIAGNOSIS — N899 Noninflammatory disorder of vagina, unspecified: Secondary | ICD-10-CM | POA: Insufficient documentation

## 2014-12-30 DIAGNOSIS — Z87442 Personal history of urinary calculi: Secondary | ICD-10-CM | POA: Insufficient documentation

## 2014-12-30 LAB — URINALYSIS, ROUTINE W REFLEX MICROSCOPIC
Bilirubin Urine: NEGATIVE
Glucose, UA: NEGATIVE mg/dL
Ketones, ur: NEGATIVE mg/dL
NITRITE: NEGATIVE
PH: 6 (ref 5.0–8.0)
Protein, ur: NEGATIVE mg/dL
SPECIFIC GRAVITY, URINE: 1.015 (ref 1.005–1.030)
UROBILINOGEN UA: 0.2 mg/dL (ref 0.0–1.0)

## 2014-12-30 LAB — URINE MICROSCOPIC-ADD ON

## 2014-12-30 LAB — WET PREP, GENITAL
CLUE CELLS WET PREP: NONE SEEN
TRICH WET PREP: NONE SEEN
Yeast Wet Prep HPF POC: NONE SEEN

## 2014-12-30 LAB — POCT PREGNANCY, URINE: PREG TEST UR: NEGATIVE

## 2014-12-30 NOTE — MAU Note (Signed)
Rasch, np in triage to talk with pt and explain wait, pt will continue to wait for treatment.

## 2014-12-30 NOTE — MAU Note (Signed)
3 bumps on "back of vaginal area."  Had appointment with [facility] but they cancelled the appointment.  Bumps are irritated, not really painful or itching.  Denies bleeding or discharge today.

## 2014-12-30 NOTE — MAU Provider Note (Signed)
Patient here with vaginal discomfort and 3 bumps on her vagina. The bumps are the color of her skin. "looks like a pimp with no pus". These symptoms have been going on since Saturday. [facility] but she cannot wait until.   I offered the patient to be seen in our [place]; patient declined and would like to wait to be seen.

## 2014-12-30 NOTE — MAU Provider Note (Signed)
History     CSN: [id number]  Arrival date and time: 12/30/14 1541   First Provider Initiated Contact with Patient 12/30/14 2103      Chief Complaint  Patient presents with  . vaginal bumps    HPI  Christina Fox is a [id number] here with report of 3 bumps on her vagina x 3 days. The bumps are the color of her skin. "looks like a pimp with no pus". With partner x 2 years and recently found out he had been involved with someone else.  Lesions are not painful and do not itch.  Shaves vaginal area with razor.  Did not do anything different with shaving this time.  Pt requests STD screen.    Past Medical History  Diagnosis Date  . Headache(784.0)     sinus  . GERD (gastroesophageal reflux disease)   . Kidney stones     no current problem  . Anxiety   . Deviated septum 09/2011  . 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  . Complication of anesthesia     states small mouth  . Pelvic inflammatory disease     Past Surgical History  Procedure Laterality Date  . Polyps removed      colon  . Wisdom tooth extraction    . Nasal septoplasty w/ turbinoplasty  10/04/2011    Procedure: NASAL SEPTOPLASTY WITH TURBINATE REDUCTION;  Surgeon: Ascencion Dike, MD;  Location: [facility];  Service: ENT;  Laterality: Bilateral;    Family History  Problem Relation Age of Onset  . Cancer Mother   . Hypertension Mother   . Hypertension Father   . Liver disease Father   . Cancer Brother   . Hypertension Other     History  Substance Use Topics  . Smoking status: Former Smoker -- 0.25 packs/day  . Smokeless tobacco: Never Used  . Alcohol Use: No    Allergies:  Allergies  Allergen Reactions  . Sulfa Antibiotics Hives and Rash    Prescriptions prior to admission  Medication Sig Dispense Refill Last Dose  . amoxicillin-clavulanate (AUGMENTIN) 875-125 MG per tablet Take 1 tablet by mouth 2 (two) times daily. 20 tablet 0 12/30/2014 at Unknown time  . clonazePAM (KLONOPIN)  1 MG tablet Take 1 mg by mouth daily.    12/30/2014 at Unknown time  . fluticasone (FLONASE) 50 MCG/ACT nasal spray Place 2 sprays into both nostrils daily. 16 g 0 Past Week at Unknown time  . levonorgestrel-ethinyl estradiol (AUBRA) 0.1-20 MG-MCG tablet Take 1 tablet by mouth daily.   12/30/2014 at Unknown time  . montelukast (SINGULAIR) 10 MG tablet Take 1 tablet (10 mg total) by mouth daily. 30 tablet 2 12/30/2014 at Unknown time  . albuterol (PROVENTIL HFA;VENTOLIN HFA) 108 (90 BASE) MCG/ACT inhaler Inhale 1-2 puffs into the lungs every 6 (six) hours as needed for wheezing or shortness of breath. (Patient not taking: Reported on 10/29/2014) 1 Inhaler 0 Unknown at Unknown time  . cephALEXin (KEFLEX) 500 MG capsule Take 1 capsule (500 mg total) by mouth 4 (four) times daily. (Patient not taking: Reported on 10/07/2014) 28 capsule 0 Unknown at Unknown time  . clonazePAM (KLONOPIN) 0.5 MG tablet Take 1 tablet (0.5 mg total) by mouth daily as needed for anxiety. 30 tablet 0 Unknown at Unknown time  . cyclobenzaprine (FLEXERIL) 5 MG tablet Take 1 tablet (5 mg total) by mouth 3 (three) times daily as needed for muscle spasms. 15 tablet 0   .  fluconazole (DIFLUCAN) 150 MG tablet Take 1 tablet (150 mg total) by mouth once. One tablet on day 5 and one tab at end of antibiotic course 2 tablet 0 Unknown at Unknown time  . hydrOXYzine (ATARAX/VISTARIL) 25 MG tablet Take 1 tablet (25 mg total) by mouth every 6 (six) hours as needed for itching. (Patient not taking: Reported on 10/29/2014) 30 tablet 0 Unknown at Unknown time  . ipratropium (ATROVENT) 0.06 % nasal spray Place 2 sprays into both nostrils 4 (four) times daily. 15 mL 1     Review of Systems  Genitourinary:       Vaginal lesions  All other systems reviewed and are negative.  Physical Exam   Blood pressure 134/80, pulse 88, temperature 98.5 F (36.9 C), temperature source Oral, resp. rate 18, last menstrual period 12/27/2014.  Physical Exam    Constitutional: She is oriented to person, place, and time. She appears well-developed and well-nourished. No distress.  HENT:  Head: Normocephalic.  Neck: Normal range of motion. Neck supple.  Cardiovascular: Normal rate, regular rhythm and normal heart sounds.   Respiratory: Effort normal and breath sounds normal. No respiratory distress.  GI: Soft.  Genitourinary:    There is lesion on the right labia. There is lesion on the left labia. No erythema or bleeding in the vagina.  Musculoskeletal: Normal range of motion. She exhibits no edema.  Neurological: She is alert and oriented to person, place, and time. She has normal reflexes.  Skin: Skin is warm and dry.    MAU Course  Procedures  RPR, HIV, HSV  GC/CT and wet prep  Results for orders placed or performed during the hospital encounter of 12/30/14 (from the past 24 hour(s))  Urinalysis, Routine w reflex microscopic (not at [facility])     Status: Abnormal   Collection Time: 12/30/14  4:03 PM  Result Value Ref Range   Color, Urine YELLOW YELLOW   APPearance CLEAR CLEAR   Specific Gravity, Urine 1.015 1.005 - 1.030   pH 6.0 5.0 - 8.0   Glucose, UA NEGATIVE NEGATIVE mg/dL   Hgb urine dipstick MODERATE (A) NEGATIVE   Bilirubin Urine NEGATIVE NEGATIVE   Ketones, ur NEGATIVE NEGATIVE mg/dL   Protein, ur NEGATIVE NEGATIVE mg/dL   Urobilinogen, UA 0.2 0.0 - 1.0 mg/dL   Nitrite NEGATIVE NEGATIVE   Leukocytes, UA TRACE (A) NEGATIVE  Urine microscopic-add on     Status: Abnormal   Collection Time: 12/30/14  4:03 PM  Result Value Ref Range   Squamous Epithelial / LPF FEW (A) RARE   WBC, UA 0-2 <3 WBC/hpf   RBC / HPF 0-2 <3 RBC/hpf   Bacteria, UA RARE RARE   Urine-Other MUCOUS PRESENT   Pregnancy, urine POC     Status: None   Collection Time: 12/30/14  4:20 PM  Result Value Ref Range   Preg Test, Ur NEGATIVE NEGATIVE  Wet prep, genital     Status: Abnormal   Collection Time: 12/30/14  9:18 PM  Result Value Ref Range   Yeast  Wet Prep HPF POC NONE SEEN NONE SEEN   Trich, Wet Prep NONE SEEN NONE SEEN   Clue Cells Wet Prep HPF POC NONE SEEN NONE SEEN   WBC, Wet Prep HPF POC MANY (A) NONE SEEN    Assessment and Plan  Molluscum STD Screen  Plan: Explained course and transmission of molluscum - self limiting HSV, RPR, GC/CT and HIV pending Reviewed MyChart and how to get results  Gwen Pounds 12/30/2014,  10:09 PM

## 2014-12-31 LAB — HIV ANTIBODY (ROUTINE TESTING W REFLEX): HIV Screen 4th Generation wRfx: NONREACTIVE

## 2014-12-31 LAB — GC/CHLAMYDIA PROBE AMP (~~LOC~~) NOT AT ARMC
CHLAMYDIA, DNA PROBE: NEGATIVE
NEISSERIA GONORRHEA: NEGATIVE

## 2014-12-31 LAB — RPR: RPR Ser Ql: NONREACTIVE

## 2015-01-01 LAB — HSV(HERPES SMPLX)ABS-I+II(IGG+IGM)-BLD
HSV 1 GLYCOPROTEIN G AB, IGG: 26.9 {index} — AB (ref 0.00–0.90)
HSV 2 Glycoprotein G Ab, IgG: 16.8 index — ABNORMAL HIGH (ref 0.00–0.90)
HSVI/II Comb IgM: 1.52 Ratio — ABNORMAL HIGH (ref 0.00–0.90)

## 2015-01-02 ENCOUNTER — Telehealth: Payer: Self-pay | Admitting: Nurse Practitioner

## 2015-01-02 ENCOUNTER — Telehealth (HOSPITAL_COMMUNITY): Payer: Self-pay

## 2015-01-02 DIAGNOSIS — N3000 Acute cystitis without hematuria: Secondary | ICD-10-CM

## 2015-01-02 MED ORDER — NITROFURANTOIN MONOHYD MACRO 100 MG PO CAPS: 100.0000 mg | ORAL_CAPSULE | Freq: Two times a day (BID) | ORAL | Status: DC

## 2015-01-02 NOTE — Progress Notes (Signed)
We are sorry that you are not feeling well.  Here is how we plan to help!  Based on what you shared with me it looks like you most likely have a simple urinary tract infection.  A UTI (Urinary Tract Infection) is a bacterial infection of the bladder.  Most cases of urinary tract infections are simple to treat but a key part of your care is to encourage you to drink plenty of fluids and watch your symptoms carefully.  I have prescribed MacroBid 100 mg twice a day for 5 days.  Your symptoms should gradually improve. Call us if the burning in your urine worsens, you develop worsening fever, back pain or pelvic pain or if your symptoms do not resolve after completing the antibiotic.  Urinary tract infections can be prevented by drinking plenty of water to keep your body hydrated.  Also be sure when you wipe, wipe from front to back and don't hold it in!  If possible, empty your bladder every 4 hours.  Your e-visit answers were reviewed by a board certified advanced clinical practitioner to complete your personal care plan.  Depending on the condition, your plan could have included both over the counter or prescription medications.  If there is a problem please reply  once you have received a response from your provider.  Your safety is important to [place].  If you have drug allergies check your prescription carefully.    You can use MyChart to ask questions about today's visit, request a non-urgent call back, or ask for a work or school excuse.  You will get an e-mail in the next two days asking about your experience.  I hope that your e-visit has been valuable and will speed your recovery. Thank you for using e-visits.

## 2015-01-05 ENCOUNTER — Telehealth: Payer: Self-pay | Admitting: Family

## 2015-01-05 DIAGNOSIS — N390 Urinary tract infection, site not specified: Secondary | ICD-10-CM

## 2015-01-05 MED ORDER — CIPROFLOXACIN HCL 500 MG PO TABS: 500.0000 mg | ORAL_TABLET | Freq: Two times a day (BID) | ORAL | Status: DC

## 2015-01-05 NOTE — Progress Notes (Signed)
We are sorry that you are not feeling well.  Here is how we plan to help!  Based on what you shared with me it looks like you most likely have a simple urinary tract infection.  A UTI (Urinary Tract Infection) is a bacterial infection of the bladder.  Most cases of urinary tract infections are simple to treat but a key part of your care is to encourage you to drink plenty of fluids and watch your symptoms carefully.  STOP THE MACROBID AND START THIS NEW ANTIBIOTIC:  I have prescribed Ciprofloxacin 500 mg twice a day for 5 days.  Your symptoms should gradually improve. Call us if the burning in your urine worsens, you develop worsening fever, back pain or pelvic pain or if your symptoms do not resolve after completing the antibiotic.  Urinary tract infections can be prevented by drinking plenty of water to keep your body hydrated.  Also be sure when you wipe, wipe from front to back and don't hold it in!  If possible, empty your bladder every 4 hours.  Your e-visit answers were reviewed by a board certified advanced clinical practitioner to complete your personal care plan.  Depending on the condition, your plan could have included both over the counter or prescription medications.  If there is a problem please reply  once you have received a response from your provider.  Your safety is important to [place].  If you have drug allergies check your prescription carefully.    You can use MyChart to ask questions about today's visit, request a non-urgent call back, or ask for a work or school excuse.  You will get an e-mail in the next two days asking about your experience.  I hope that your e-visit has been valuable and will speed your recovery. Thank you for using e-visits.

## 2015-01-06 ENCOUNTER — Telehealth: Payer: Self-pay | Admitting: Family

## 2015-01-06 NOTE — Telephone Encounter (Signed)
Called patient and reviewed HSV results.  Emphasized the lesions did not have the appearance of HSV and results could be a false positive.  Return back to MAU for painful lesions or if lesions change appearance for a culture.

## 2015-01-07 ENCOUNTER — Telehealth: Payer: Self-pay | Admitting: *Deleted

## 2015-01-07 NOTE — Telephone Encounter (Signed)
Called patient to follow up on 2 recent e-visits.  Left message for pt to call back

## 2015-01-08 ENCOUNTER — Emergency Department (HOSPITAL_COMMUNITY): Payer: Medicaid Other

## 2015-01-08 ENCOUNTER — Inpatient Hospital Stay (HOSPITAL_COMMUNITY)
Admission: AD | Admit: 2015-01-08 | Discharge: 2015-01-08 | Disposition: A | Discharge status: Home / Self Care | Payer: Self-pay | Source: Ambulatory Visit | Attending: Obstetrics and Gynecology | Admitting: Obstetrics and Gynecology

## 2015-01-08 ENCOUNTER — Encounter (HOSPITAL_COMMUNITY): Payer: Self-pay | Admitting: *Deleted

## 2015-01-08 ENCOUNTER — Encounter (HOSPITAL_COMMUNITY): Payer: Self-pay | Admitting: Family Medicine

## 2015-01-08 ENCOUNTER — Emergency Department (HOSPITAL_COMMUNITY)
Admission: EM | Admit: 2015-01-08 | Discharge: 2015-01-08 | Disposition: A | Discharge status: Home / Self Care | Payer: Medicaid Other | Attending: Emergency Medicine | Admitting: Emergency Medicine

## 2015-01-08 DIAGNOSIS — R350 Frequency of micturition: Secondary | ICD-10-CM | POA: Insufficient documentation

## 2015-01-08 DIAGNOSIS — Z3202 Encounter for pregnancy test, result negative: Secondary | ICD-10-CM | POA: Insufficient documentation

## 2015-01-08 DIAGNOSIS — Z8719 Personal history of other diseases of the digestive system: Secondary | ICD-10-CM | POA: Insufficient documentation

## 2015-01-08 DIAGNOSIS — Z8742 Personal history of other diseases of the female genital tract: Secondary | ICD-10-CM | POA: Insufficient documentation

## 2015-01-08 DIAGNOSIS — Z87891 Personal history of nicotine dependence: Secondary | ICD-10-CM | POA: Insufficient documentation

## 2015-01-08 DIAGNOSIS — Z87442 Personal history of urinary calculi: Secondary | ICD-10-CM | POA: Insufficient documentation

## 2015-01-08 DIAGNOSIS — Z793 Long term (current) use of hormonal contraceptives: Secondary | ICD-10-CM | POA: Insufficient documentation

## 2015-01-08 DIAGNOSIS — R358 Other polyuria: Secondary | ICD-10-CM | POA: Insufficient documentation

## 2015-01-08 DIAGNOSIS — R1084 Generalized abdominal pain: Secondary | ICD-10-CM | POA: Insufficient documentation

## 2015-01-08 DIAGNOSIS — Z7951 Long term (current) use of inhaled steroids: Secondary | ICD-10-CM | POA: Insufficient documentation

## 2015-01-08 DIAGNOSIS — Z79899 Other long term (current) drug therapy: Secondary | ICD-10-CM | POA: Insufficient documentation

## 2015-01-08 DIAGNOSIS — Z8709 Personal history of other diseases of the respiratory system: Secondary | ICD-10-CM | POA: Insufficient documentation

## 2015-01-08 LAB — URINALYSIS, ROUTINE W REFLEX MICROSCOPIC
Bilirubin Urine: NEGATIVE
Bilirubin Urine: NEGATIVE
GLUCOSE, UA: NEGATIVE mg/dL
Glucose, UA: NEGATIVE mg/dL
KETONES UR: NEGATIVE mg/dL
Ketones, ur: NEGATIVE mg/dL
LEUKOCYTES UA: NEGATIVE
Leukocytes, UA: NEGATIVE
Nitrite: NEGATIVE
Nitrite: NEGATIVE
PH: 7.5 (ref 5.0–8.0)
PROTEIN: NEGATIVE mg/dL
Protein, ur: NEGATIVE mg/dL
Specific Gravity, Urine: 1.01 (ref 1.005–1.030)
Specific Gravity, Urine: 1.017 (ref 1.005–1.030)
UROBILINOGEN UA: 0.2 mg/dL (ref 0.0–1.0)
UROBILINOGEN UA: 0.2 mg/dL (ref 0.0–1.0)
pH: 7 (ref 5.0–8.0)

## 2015-01-08 LAB — WET PREP, GENITAL
Clue Cells Wet Prep HPF POC: NONE SEEN
TRICH WET PREP: NONE SEEN
Yeast Wet Prep HPF POC: NONE SEEN

## 2015-01-08 LAB — CBC WITH DIFFERENTIAL/PLATELET
BASOS ABS: 0.1 10*3/uL (ref 0.0–0.1)
Basophils Relative: 1 % (ref 0–1)
EOS ABS: 0.4 10*3/uL (ref 0.0–0.7)
EOS PCT: 6 % — AB (ref 0–5)
HEMATOCRIT: 38 % (ref 36.0–46.0)
Hemoglobin: 12.5 g/dL (ref 12.0–15.0)
LYMPHS PCT: 29 % (ref 12–46)
Lymphs Abs: 2.1 10*3/uL (ref 0.7–4.0)
MCH: 28.7 pg (ref 26.0–34.0)
MCHC: 32.9 g/dL (ref 30.0–36.0)
MCV: 87.4 fL (ref 78.0–100.0)
Monocytes Absolute: 0.7 10*3/uL (ref 0.1–1.0)
Monocytes Relative: 9 % (ref 3–12)
NEUTROS ABS: 3.9 10*3/uL (ref 1.7–7.7)
Neutrophils Relative %: 55 % (ref 43–77)
Platelets: 290 10*3/uL (ref 150–400)
RBC: 4.35 MIL/uL (ref 3.87–5.11)
RDW: 12.3 % (ref 11.5–15.5)
WBC: 7.1 10*3/uL (ref 4.0–10.5)

## 2015-01-08 LAB — PREGNANCY, URINE: Preg Test, Ur: NEGATIVE

## 2015-01-08 LAB — BASIC METABOLIC PANEL
ANION GAP: 6 (ref 5–15)
BUN: 10 mg/dL (ref 6–20)
CO2: 26 mmol/L (ref 22–32)
Calcium: 9 mg/dL (ref 8.9–10.3)
Chloride: 105 mmol/L (ref 101–111)
Creatinine, Ser: 0.68 mg/dL (ref 0.44–1.00)
GFR calc Af Amer: >60 mL/min (ref >60)
GLUCOSE: 104 mg/dL — AB (ref 65–99)
Potassium: 3.7 mmol/L (ref 3.5–5.1)
Sodium: 137 mmol/L (ref 135–145)

## 2015-01-08 LAB — URINE MICROSCOPIC-ADD ON

## 2015-01-08 LAB — POCT PREGNANCY, URINE: Preg Test, Ur: NEGATIVE

## 2015-01-08 MED ORDER — IOHEXOL 300 MG/ML  SOLN
25.0000 mL | Freq: Once | INTRAMUSCULAR | Status: AC | PRN
  Administered 2015-01-08: 25 mL via ORAL

## 2015-01-08 MED ORDER — ONDANSETRON HCL 4 MG PO TABS: 4.0000 mg | ORAL_TABLET | Freq: Four times a day (QID) | ORAL | Status: DC

## 2015-01-08 MED ORDER — ONDANSETRON HCL 4 MG/2ML IJ SOLN: 4.0000 mg | Freq: Once | INTRAMUSCULAR | Status: DC

## 2015-01-08 MED ORDER — HYDROCODONE-ACETAMINOPHEN 5-325 MG PO TABS: 2.0000 | ORAL_TABLET | ORAL | Status: DC | PRN

## 2015-01-08 MED ORDER — CIPROFLOXACIN HCL 500 MG PO TABS: 500.0000 mg | ORAL_TABLET | Freq: Two times a day (BID) | ORAL | Status: DC

## 2015-01-08 MED ORDER — IOHEXOL 300 MG/ML  SOLN
100.0000 mL | Freq: Once | INTRAMUSCULAR | Status: AC | PRN
  Administered 2015-01-08: 100 mL via INTRAVENOUS

## 2015-01-08 MED ORDER — ONDANSETRON 4 MG PO TBDP
4.0000 mg | ORAL_TABLET | Freq: Once | ORAL | Status: AC
  Administered 2015-01-08: 4 mg via ORAL
  Filled 2015-01-08: qty 1

## 2015-01-08 MED ORDER — HYDROCODONE-ACETAMINOPHEN 5-325 MG PO TABS
1.0000 | ORAL_TABLET | Freq: Once | ORAL | Status: AC
  Administered 2015-01-08: 1 via ORAL
  Filled 2015-01-08: qty 1

## 2015-01-08 MED ORDER — MORPHINE SULFATE 4 MG/ML IJ SOLN: 4.0000 mg | Freq: Once | INTRAMUSCULAR | Status: DC

## 2015-01-08 NOTE — Discharge Instructions (Signed)

## 2015-01-08 NOTE — Discharge Instructions (Signed)
Abdominal Pain, Women °Abdominal (stomach, pelvic, or belly) pain can be caused by many things. It is important to tell your doctor: °· The location of the pain. °· Does it come and go or is it present all the time? °· Are there things that start the pain (eating certain foods, exercise)? °· Are there other symptoms associated with the pain (fever, nausea, vomiting, diarrhea)? °All of this is helpful to know when trying to find the cause of the pain. °CAUSES  °· Stomach: virus or bacteria infection, or ulcer. °· Intestine: appendicitis (inflamed appendix), regional ileitis (Crohn's disease), ulcerative colitis (inflamed colon), irritable bowel syndrome, diverticulitis (inflamed diverticulum of the colon), or cancer of the stomach or intestine. °· Gallbladder disease or stones in the gallbladder. °· Kidney disease, kidney stones, or infection. °· Pancreas infection or cancer. °· Fibromyalgia (pain disorder). °· Diseases of the female organs: °¨ Uterus: fibroid (non-cancerous) tumors or infection. °¨ Fallopian tubes: infection or tubal pregnancy. °¨ Ovary: cysts or tumors. °¨ Pelvic adhesions (scar tissue). °¨ Endometriosis (uterus lining tissue growing in the pelvis and on the pelvic organs). °¨ Pelvic congestion syndrome (female organs filling up with blood just before the menstrual period). °¨ Pain with the menstrual period. °¨ Pain with ovulation (producing an egg). °¨ Pain with an IUD (intrauterine device, birth control) in the uterus. °¨ Cancer of the female organs. °· Functional pain (pain not caused by a disease, may improve without treatment). °· Psychological pain. °· Depression. °DIAGNOSIS  °Your doctor will decide the seriousness of your pain by doing an examination. °· Blood tests. °· X-rays. °· Ultrasound. °· CT scan (computed tomography, special type of X-ray). °· MRI (magnetic resonance imaging). °· Cultures, for infection. °· Barium enema (dye inserted in the large intestine, to better view it with  X-rays). °· Colonoscopy (looking in intestine with a lighted tube). °· Laparoscopy (minor surgery, looking in abdomen with a lighted tube). °· Major abdominal exploratory surgery (looking in abdomen with a large incision). °TREATMENT  °The treatment will depend on the cause of the pain.  °· Many cases can be observed and treated at home. °· Over-the-counter medicines recommended by your caregiver. °· Prescription medicine. °· Antibiotics, for infection. °· Birth control pills, for painful periods or for ovulation pain. °· Hormone treatment, for endometriosis. °· Nerve blocking injections. °· Physical therapy. °· Antidepressants. °· Counseling with a psychologist or psychiatrist. °· Minor or major surgery. °HOME CARE INSTRUCTIONS  °· Do not take laxatives, unless directed by your caregiver. °· Take over-the-counter pain medicine only if ordered by your caregiver. Do not take aspirin because it can cause an upset stomach or bleeding. °· Try a clear liquid diet (broth or water) as ordered by your caregiver. Slowly move to a bland diet, as tolerated, if the pain is related to the stomach or intestine. °· Have a thermometer and take your temperature several times a day, and record it. °· Bed rest and sleep, if it helps the pain. °· Avoid sexual intercourse, if it causes pain. °· Avoid stressful situations. °· Keep your follow-up appointments and tests, as your caregiver orders. °· If the pain does not go away with medicine or surgery, you may try: °¨ Acupuncture. °¨ Relaxation exercises (yoga, meditation). °¨ Group therapy. °¨ Counseling. °SEEK MEDICAL CARE IF:  °· You notice certain foods cause stomach pain. °· Your home care treatment is not helping your pain. °· You need stronger pain medicine. °· You want your IUD removed. °· You feel faint or   lightheaded. °· You develop nausea and vomiting. °· You develop a rash. °· You are having side effects or an allergy to your medicine. °SEEK IMMEDIATE MEDICAL CARE IF:  °· Your  pain does not go away or gets worse. °· You have a fever. °· Your pain is felt only in portions of the abdomen. The right side could possibly be appendicitis. The left lower portion of the abdomen could be colitis or diverticulitis. °· You are passing blood in your stools (bright red or black tarry stools, with or without vomiting). °· You have blood in your urine. °· You develop chills, with or without a fever. °· You pass out. °MAKE SURE YOU:  °· Understand these instructions. °· Will watch your condition. °· Will get help right away if you are not doing well or get worse. °Document Released: 04/10/2007 Document Revised: 10/28/2013 Document Reviewed: 04/30/2009 °ExitCare® Patient Information ©2015 [employer]. This information is not intended to replace advice given to you by your health care provider. Make sure you discuss any questions you have with your health care provider. ° °

## 2015-01-08 NOTE — ED Notes (Signed)
PA at bedside.

## 2015-01-08 NOTE — ED Notes (Signed)
Patient c/o LLQ/pelvic pain. Patient with recent UTI. Seen at [facility] for the same.

## 2015-01-08 NOTE — ED Provider Notes (Signed)
CSN: [id number]     Arrival date & time 01/08/15  0307 History   First MD Initiated Contact with Patient 01/08/15 0327     Chief Complaint  Patient presents with  . Abdominal Pain  . Urinary Frequency     (Consider location/radiation/quality/duration/timing/severity/associated sxs/prior Treatment) HPI Comments: Christina Fox, 34 y/o female with history of anxiety, kidney stones, UTIs, back pain, and flank pain presents to the ED with right lower abdominal pain radiating to her groin and buttocks that started yesterday. She denies pain with urination but states urinating is uncomfortable. She has been urinating multiple times each hour and she awoke from sleep ten times in one hour last night to urinate. She states her urine is clear. She was diagnosed with a UTI last week and was switched to ciprofloxacin on Monday. She went to women's health yesterday where they found nothing abnormal with her urine or pelvic exam and recommended she come to the ED for possibly a urologist or neurologist. She has had a 10 lb weight gain in the past month. She denies blood in urine, fevers, or sexual activity within the last three months. This is her 12th visit to the ED this year. She denies nausea, vomiting, or any other associated symptoms. No aggravating/allevaiting factors. No history of abdominal surgery.   Patient is a 34 y.o. female presenting with abdominal pain and frequency. The history is provided by the patient.  Abdominal Pain Pain location:  Suprapubic Pain quality: burning   Pain radiates to:  Groin, perineum and back Pain severity:  Moderate Duration:  2 days Timing:  Constant Chronicity:  New Context: awakening from sleep   Ineffective treatments:  NSAIDs Associated symptoms: no vaginal discharge   Urinary Frequency Associated symptoms include abdominal pain.    Past Medical History  Diagnosis Date  . Headache(784.0)     sinus  . GERD (gastroesophageal reflux disease)   . Kidney  stones     no current problem  . Anxiety   . Deviated septum 09/2011  . 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  . Complication of anesthesia     states small mouth  . Pelvic inflammatory disease    Past Surgical History  Procedure Laterality Date  . Polyps removed      colon  . Wisdom tooth extraction    . Nasal septoplasty w/ turbinoplasty  10/04/2011    Procedure: NASAL SEPTOPLASTY WITH TURBINATE REDUCTION;  Surgeon: Ascencion Dike, MD;  Location: [facility];  Service: ENT;  Laterality: Bilateral;   Family History  Problem Relation Age of Onset  . Cancer Mother   . Hypertension Mother   . Hypertension Father   . Liver disease Father   . Cancer Brother   . Hypertension Other    History  Substance Use Topics  . Smoking status: Former Smoker -- 0.25 packs/day  . Smokeless tobacco: Never Used  . Alcohol Use: No   OB History    Gravida Para Term Preterm AB TAB SAB Ectopic Multiple Living   4 4 4       4      Review of Systems  HENT: Negative for drooling.   Gastrointestinal: Positive for abdominal pain.       Abdominal pain and tenderness  Endocrine: Positive for polyuria.  Genitourinary: Positive for frequency. Negative for vaginal discharge.       Discomfort with urination, but not pain.  All other systems reviewed and are negative.  Allergies  Sulfa antibiotics  Home Medications   Prior to Admission medications   Medication Sig Start Date End Date Taking? Authorizing Provider  albuterol (PROVENTIL HFA;VENTOLIN HFA) 108 (90 BASE) MCG/ACT inhaler Inhale 1-2 puffs into the lungs every 6 (six) hours as needed for wheezing or shortness of breath. Patient not taking: Reported on 10/29/2014 07/30/14   Harden Mo, MD  Ascorbic Acid (VITAMIN C PO) Take 1 tablet by mouth daily.    Historical Provider, MD  ciprofloxacin (CIPRO) 500 MG tablet Take 1 tablet (500 mg total) by mouth 2 (two) times daily. 01/05/15   Sharion Balloon, FNP  fluticasone  (FLONASE) 50 MCG/ACT nasal spray Place 2 sprays into both nostrils daily. 07/30/14   Harden Mo, MD  ibuprofen (ADVIL,MOTRIN) 200 MG tablet Take 400 mg by mouth every 6 (six) hours as needed for headache or mild pain.    Historical Provider, MD  ipratropium (ATROVENT) 0.06 % nasal spray Place 2 sprays into both nostrils 4 (four) times daily. Patient not taking: Reported on 12/30/2014 12/19/14   Billy Fischer, MD  levonorgestrel-ethinyl estradiol (AUBRA) 0.1-20 MG-MCG tablet Take 1 tablet by mouth daily.    Historical Provider, MD  montelukast (SINGULAIR) 10 MG tablet Take 1 tablet (10 mg total) by mouth daily. 12/05/14 12/05/15  Johnn Hai, PA-C   BP 137/54 mmHg  Pulse 57  Temp(Src) 97.8 F (36.6 C) (Oral)  Resp 20  Ht 5\' 1"  (1.549 m)  Wt 204 lb (92.534 kg)  BMI 38.57 kg/m2  SpO2 98%  LMP 12/26/2014 Physical Exam  Constitutional: She is oriented to person, place, and time. She appears well-developed and well-nourished. No distress.  HENT:  Head: Normocephalic and atraumatic.  Eyes: Conjunctivae and EOM are normal.  Cardiovascular: Normal rate and regular rhythm.  Exam reveals no gallop and no friction rub.   No murmur heard. Pulmonary/Chest: Effort normal and breath sounds normal. No respiratory distress. She has no wheezes. She has no rales. She exhibits no tenderness.  Abdominal: Soft. Bowel sounds are normal. There is tenderness.  RLQ tenderness to palpation. No other focal tenderness.   Genitourinary:  No CVA tenderness.   Musculoskeletal: Normal range of motion.  Neurological: She is alert and oriented to person, place, and time.  Speech is goal-oriented. Moves limbs without ataxia.   Skin: Skin is warm and dry.  Psychiatric: She has a normal mood and affect. Her behavior is normal.  Nursing note and vitals reviewed.   ED Course  Procedures (including critical care time) Labs Review Labs Reviewed  CBC WITH DIFFERENTIAL/PLATELET - Abnormal; Notable for the following:     Eosinophils Relative 6 (*)    All other components within normal limits  BASIC METABOLIC PANEL - Abnormal; Notable for the following:    Glucose, Bld 104 (*)    All other components within normal limits  URINALYSIS, ROUTINE W REFLEX MICROSCOPIC (NOT AT [facility]) - Abnormal; Notable for the following:    Hgb urine dipstick TRACE (*)    All other components within normal limits  URINE CULTURE  PREGNANCY, URINE  URINE MICROSCOPIC-ADD ON    Imaging Review Ct Abdomen Pelvis W Contrast  01/08/2015   CLINICAL DATA:  Lower abdomen, left groin, and left buttock pain. Recent diagnosis of urinary tract infection. Right lower quadrant pain.  EXAM: CT ABDOMEN AND PELVIS WITH CONTRAST  TECHNIQUE: Multidetector CT imaging of the abdomen and pelvis was performed using the standard protocol following bolus administration of intravenous contrast.  CONTRAST:  127mL OMNIPAQUE IOHEXOL 300 MG/ML  SOLN  COMPARISON:  05/06/2014  FINDINGS: Lung bases are clear.  Mild diffuse fatty infiltration in the liver. Gallbladder, pancreas, spleen, adrenal glands, abdominal aorta, inferior vena cava, and retroperitoneal lymph nodes are unremarkable. Punctate size stone in the midpole right kidney. No hydronephrosis or hydroureter in either kidney. No solid renal lesions. Stomach, small bowel, and colon are not abnormally distended. Scattered stool in the colon. No free air or free fluid in the abdomen. Small umbilical hernia containing fat.  Pelvis: Appendix is normal. Uterus and ovaries are not enlarged. No free or loculated pelvic fluid collections. No pelvic mass or lymphadenopathy. Bladder wall is not thickened. No destructive bone lesions.  IMPRESSION: Nonobstructing stone in the midportion right kidney. Mild fatty infiltration of the liver. No acute process demonstrated in the abdomen or pelvis. Appendix is normal.   Electronically Signed   By: Lucienne Capers M.D.   On: 01/08/2015 06:30     EKG Interpretation None       MDM   Final diagnoses:  Generalized abdominal pain    5:03 AM Labs and urinalysis pending. CT abdomen pelvis pending to rule out appendicitis.   Patient signed out to Delos Haring, PA-C pending CT abdomen pelvis.     Alvina Chou, PA-C 01/08/15 2123  Everlene Balls, MD 01/09/15 1350

## 2015-01-08 NOTE — MAU Note (Signed)
Pt states she has urinary frequency. States she started Macrobid on Friday and then they called on Monday and changed her antibiotic to Cipro. States she thinks her symptoms are getting worse.

## 2015-01-08 NOTE — ED Notes (Signed)
Patient states she was diagnosed with a urinary tract infection on Friday. Given Macrobid, took Friday til Monday. Had an allergic reaction to Macrobid. Pt was switched to Cipro. Pt went to [facility] before coming to [facility]. Pt states a pelvic exam was performed and urinalysis was performed. Pt was advised to come to [facility] due to urinologist and could be diabetes medically related. Pt has pain in lower abd, left groin, and left buttock cheek.

## 2015-01-08 NOTE — ED Provider Notes (Signed)
Patient seen by Alvina Chou, PA-C Waiting for CT abd/pelv to r/o appendicitis.   "Christina Fox, 34 y/o female with history of anxiety, kidney stones, UTIs, back pain, and flank pain presents to the ED with right lower abdominal pain radiating to her groin and buttocks that started yesterday. She denies pain with urination but states urinating is uncomfortable. She has been urinating multiple times each hour and she awoke from sleep ten times in one hour last night to urinate. She states her urine is clear. She was diagnosed with a UTI last week and was switched to ciprofloxacin on Monday. She went to women's health yesterday where they found nothing abnormal with her urine or pelvic exam and recommended she come to the ED for possibly a urologist or neurologist. She has had a 10 lb weight gain in the past month. She denies blood in urine, fevers, or sexual activity within the last three months. This is her 12th visit to the ED this year. She denies nausea, vomiting, or any other associated symptoms. No aggravating/allevaiting factors. No history of abdominal surgery. " - SK, PA-C  Results for orders placed or performed during the hospital encounter of 01/08/15  CBC with Differential/Platelet  Result Value Ref Range   WBC 7.1 4.0 - 10.5 K/uL   RBC 4.35 3.87 - 5.11 MIL/uL   Hemoglobin 12.5 12.0 - 15.0 g/dL   HCT 38.0 36.0 - 46.0 %   MCV 87.4 78.0 - 100.0 fL   MCH 28.7 26.0 - 34.0 pg   MCHC 32.9 30.0 - 36.0 g/dL   RDW 12.3 11.5 - 15.5 %   Platelets 290 150 - 400 K/uL   Neutrophils Relative % 55 43 - 77 %   Neutro Abs 3.9 1.7 - 7.7 K/uL   Lymphocytes Relative 29 12 - 46 %   Lymphs Abs 2.1 0.7 - 4.0 K/uL   Monocytes Relative 9 3 - 12 %   Monocytes Absolute 0.7 0.1 - 1.0 K/uL   Eosinophils Relative 6 (H) 0 - 5 %   Eosinophils Absolute 0.4 0.0 - 0.7 K/uL   Basophils Relative 1 0 - 1 %   Basophils Absolute 0.1 0.0 - 0.1 K/uL  Basic metabolic panel  Result Value Ref Range   Sodium  137 135 - 145 mmol/L   Potassium 3.7 3.5 - 5.1 mmol/L   Chloride 105 101 - 111 mmol/L   CO2 26 22 - 32 mmol/L   Glucose, Bld 104 (H) 65 - 99 mg/dL   BUN 10 6 - 20 mg/dL   Creatinine, Ser 0.68 0.44 - 1.00 mg/dL   Calcium 9.0 8.9 - 10.3 mg/dL   GFR calc non Af Amer >60 >60 mL/min   GFR calc Af Amer >60 >60 mL/min   Anion gap 6 5 - 15  Urinalysis, Routine w reflex microscopic (not at [facility])  Result Value Ref Range   Color, Urine YELLOW YELLOW   APPearance CLEAR CLEAR   Specific Gravity, Urine 1.017 1.005 - 1.030   pH 7.0 5.0 - 8.0   Glucose, UA NEGATIVE NEGATIVE mg/dL   Hgb urine dipstick TRACE (A) NEGATIVE   Bilirubin Urine NEGATIVE NEGATIVE   Ketones, ur NEGATIVE NEGATIVE mg/dL   Protein, ur NEGATIVE NEGATIVE mg/dL   Urobilinogen, UA 0.2 0.0 - 1.0 mg/dL   Nitrite NEGATIVE NEGATIVE   Leukocytes, UA NEGATIVE NEGATIVE  Pregnancy, urine  Result Value Ref Range   Preg Test, Ur NEGATIVE NEGATIVE  Urine microscopic-add on  Result Value Ref  Range   Squamous Epithelial / LPF RARE RARE   RBC / HPF 0-2 <3 RBC/hpf   Ct Abdomen Pelvis W Contrast  01/08/2015   CLINICAL DATA:  Lower abdomen, left groin, and left buttock pain. Recent diagnosis of urinary tract infection. Right lower quadrant pain.  EXAM: CT ABDOMEN AND PELVIS WITH CONTRAST  TECHNIQUE: Multidetector CT imaging of the abdomen and pelvis was performed using the standard protocol following bolus administration of intravenous contrast.  CONTRAST:  17mL OMNIPAQUE IOHEXOL 300 MG/ML  SOLN  COMPARISON:  05/06/2014  FINDINGS: Lung bases are clear.  Mild diffuse fatty infiltration in the liver. Gallbladder, pancreas, spleen, adrenal glands, abdominal aorta, inferior vena cava, and retroperitoneal lymph nodes are unremarkable. Punctate size stone in the midpole right kidney. No hydronephrosis or hydroureter in either kidney. No solid renal lesions. Stomach, small bowel, and colon are not abnormally distended. Scattered stool in the colon. No  free air or free fluid in the abdomen. Small umbilical hernia containing fat.  Pelvis: Appendix is normal. Uterus and ovaries are not enlarged. No free or loculated pelvic fluid collections. No pelvic mass or lymphadenopathy. Bladder wall is not thickened. No destructive bone lesions.  IMPRESSION: Nonobstructing stone in the midportion right kidney. Mild fatty infiltration of the liver. No acute process demonstrated in the abdomen or pelvis. Appendix is normal.   Electronically Signed   By: Lucienne Capers M.D.   On: 01/08/2015 06:30     The patient is resting on my arrival to the room to discuss lab results. Non acute CT scan of the abdomen and lab results are unremarkable. Etiology of patients pain is unclear, she was given 5 day rx of Cipro, will rx pain medications and nausea medications. She will f/u with [facility] and [facility].  Medications  iohexol (OMNIPAQUE) 300 MG/ML solution 25 mL (25 mLs Oral Contrast Given 01/08/15 0518)  iohexol (OMNIPAQUE) 300 MG/ML solution 100 mL (100 mLs Intravenous Contrast Given 01/08/15 0606)    34 y.o.Chelsei A Duerson's evaluation in the Emergency Department is complete. It has been determined that no acute conditions requiring further emergency intervention are present at this time. The patient/guardian have been advised of the diagnosis and plan. We have discussed signs and symptoms that warrant return to the ED, such as changes or worsening in symptoms.  Vital signs are stable at discharge. Filed Vitals:   01/08/15 0710  BP: 122/82  Pulse: 77  Temp: 97.9 F (36.6 C)  Resp: 18    Patient/guardian has voiced understanding and agreed to follow-up with the PCP or specialist.   Delos Haring, PA-C 01/08/15 [place], MD 01/08/15 1309

## 2015-01-08 NOTE — MAU Provider Note (Signed)
History     CSN: [id number]  Arrival date and time: 01/08/15 2409   First Provider Initiated Contact with Patient 01/08/15 0224      No chief complaint on file.  HPI Comments: Christina Fox is a 34 y.o. [phone] who presents today with urinary frequency and lower abdominal pain. She has had two E-visits, and has been on Macrobid and Cipro. She has three more days left on her Cipro rx. She states that they changed her antibiotic because she had an allergic reaction to the Macrobid. She states that her symptoms have continued, and not improved with either medication. She states that she has voided 12 times in the last hour. She denies any vaginal discharge.   Urinary Frequency  This is a new problem. The current episode started 1 to 4 weeks ago. The problem occurs every urination. The problem has been unchanged. The quality of the pain is described as burning and shooting. The pain is at a severity of 8/10. There has been no fever. She is sexually active. There is no history of pyelonephritis. Associated symptoms include frequency. Pertinent negatives include no nausea, urgency or vomiting. She has tried antibiotics for the symptoms. The treatment provided no relief.    Past Medical History  Diagnosis Date  . Headache(784.0)     sinus  . GERD (gastroesophageal reflux disease)   . Kidney stones     no current problem  . Anxiety   . Deviated septum 09/2011  . 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  . Complication of anesthesia     states small mouth  . Pelvic inflammatory disease     Past Surgical History  Procedure Laterality Date  . Polyps removed      colon  . Wisdom tooth extraction    . Nasal septoplasty w/ turbinoplasty  10/04/2011    Procedure: NASAL SEPTOPLASTY WITH TURBINATE REDUCTION;  Surgeon: Ascencion Dike, MD;  Location: [facility];  Service: ENT;  Laterality: Bilateral;    Family History  Problem Relation Age of Onset  . Cancer Mother   .  Hypertension Mother   . Hypertension Father   . Liver disease Father   . Cancer Brother   . Hypertension Other     History  Substance Use Topics  . Smoking status: Former Smoker -- 0.25 packs/day  . Smokeless tobacco: Never Used  . Alcohol Use: No    Allergies:  Allergies  Allergen Reactions  . Sulfa Antibiotics Hives and Rash    Prescriptions prior to admission  Medication Sig Dispense Refill Last Dose  . Ascorbic Acid (VITAMIN C PO) Take 1 tablet by mouth daily.   Past Month at Unknown time  . ciprofloxacin (CIPRO) 500 MG tablet Take 1 tablet (500 mg total) by mouth 2 (two) times daily. 10 tablet 0 01/07/2015 at Unknown time  . ibuprofen (ADVIL,MOTRIN) 200 MG tablet Take 400 mg by mouth every 6 (six) hours as needed for headache or mild pain.   01/07/2015 at 2330  . levonorgestrel-ethinyl estradiol (AUBRA) 0.1-20 MG-MCG tablet Take 1 tablet by mouth daily.   01/07/2015 at Unknown time  . montelukast (SINGULAIR) 10 MG tablet Take 1 tablet (10 mg total) by mouth daily. 30 tablet 2 01/07/2015 at Unknown time  . albuterol (PROVENTIL HFA;VENTOLIN HFA) 108 (90 BASE) MCG/ACT inhaler Inhale 1-2 puffs into the lungs every 6 (six) hours as needed for wheezing or shortness of breath. (Patient not taking: Reported on 10/29/2014) 1 Inhaler 0  Not Taking at Unknown time  . amoxicillin-clavulanate (AUGMENTIN) 875-125 MG per tablet Take 1 tablet by mouth 2 (two) times daily. 20 tablet 0 12/30/2014 at Unknown time  . fluticasone (FLONASE) 50 MCG/ACT nasal spray Place 2 sprays into both nostrils daily. 16 g 0 Past Week at Unknown time  . ipratropium (ATROVENT) 0.06 % nasal spray Place 2 sprays into both nostrils 4 (four) times daily. (Patient not taking: Reported on 12/30/2014) 15 mL 1 Not Taking at Unknown time  . nitrofurantoin, macrocrystal-monohydrate, (MACROBID) 100 MG capsule Take 1 capsule (100 mg total) by mouth 2 (two) times daily. 1 po BId 14 capsule 0     Review of Systems  Constitutional:  Negative for fever.  Gastrointestinal: Positive for abdominal pain. Negative for nausea, vomiting, diarrhea and constipation.  Genitourinary: Positive for frequency. Negative for dysuria and urgency.   Physical Exam   Blood pressure 139/83, pulse 68, temperature 98.1 F (36.7 C), temperature source Oral, resp. rate 15, height 5\' 1"  (1.549 m), weight 92.534 kg (204 lb), last menstrual period 12/27/2014, SpO2 100 %.  Physical Exam  Nursing note and vitals reviewed. Constitutional: She is oriented to person, place, and time. She appears well-developed and well-nourished. No distress.  HENT:  Head: Normocephalic.  Cardiovascular: Normal rate.   Respiratory: Effort normal.  GI: Soft. There is no tenderness. There is no rebound.  Genitourinary:   External: no lesion Vagina: small amount of white discharge Cervix: pink, smooth, no CMT Uterus: NSSC Adnexa: NT   Neurological: She is alert and oriented to person, place, and time.  Skin: Skin is warm and dry.  Psychiatric: She has a normal mood and affect.     Results for orders placed or performed during the hospital encounter of 01/08/15 (from the past 24 hour(s))  Urinalysis, Routine w reflex microscopic (not at [facility])     Status: Abnormal   Collection Time: 01/08/15  1:30 AM  Result Value Ref Range   Color, Urine YELLOW YELLOW   APPearance CLEAR CLEAR   Specific Gravity, Urine 1.010 1.005 - 1.030   pH 7.5 5.0 - 8.0   Glucose, UA NEGATIVE NEGATIVE mg/dL   Hgb urine dipstick TRACE (A) NEGATIVE   Bilirubin Urine NEGATIVE NEGATIVE   Ketones, ur NEGATIVE NEGATIVE mg/dL   Protein, ur NEGATIVE NEGATIVE mg/dL   Urobilinogen, UA 0.2 0.0 - 1.0 mg/dL   Nitrite NEGATIVE NEGATIVE   Leukocytes, UA NEGATIVE NEGATIVE  Urine microscopic-add on     Status: None   Collection Time: 01/08/15  1:30 AM  Result Value Ref Range   Squamous Epithelial / LPF RARE RARE   Bacteria, UA RARE RARE  Pregnancy, urine POC     Status: None   Collection Time:  01/08/15  1:48 AM  Result Value Ref Range   Preg Test, Ur NEGATIVE NEGATIVE  Wet prep, genital     Status: Abnormal   Collection Time: 01/08/15  2:32 AM  Result Value Ref Range   Yeast Wet Prep HPF POC NONE SEEN NONE SEEN   Trich, Wet Prep NONE SEEN NONE SEEN   Clue Cells Wet Prep HPF POC NONE SEEN NONE SEEN   WBC, Wet Prep HPF POC MODERATE (A) NONE SEEN    MAU Course  Procedures  MDM   Assessment and Plan   1. Urinary frequency    DC home Comfort measures reviewed  UC, GC/CT pending  RX: none, continue abx as prescribed  Return to MAU as needed FU with OB as planned  Follow-up Information    Follow up with [facility]    .   Why:  If symptoms worsen   Contact information:   [place] [id number] [phone]        Mathis Bud 01/08/2015, 2:56 AM

## 2015-01-09 LAB — URINE CULTURE
CULTURE: NO GROWTH
Culture: NO GROWTH
SPECIAL REQUESTS: NORMAL

## 2015-01-09 LAB — GC/CHLAMYDIA PROBE AMP (~~LOC~~) NOT AT ARMC
CHLAMYDIA, DNA PROBE: NEGATIVE
Neisseria Gonorrhea: NEGATIVE

## 2015-01-15 ENCOUNTER — Encounter: Payer: Self-pay | Admitting: Internal Medicine

## 2015-01-15 ENCOUNTER — Ambulatory Visit: Payer: Self-pay | Attending: Internal Medicine | Admitting: Internal Medicine

## 2015-01-15 VITALS — BP 116/78 | HR 79 | Temp 98.1°F | Resp 16 | Ht 62.0 in | Wt 200.8 lb

## 2015-01-15 DIAGNOSIS — N200 Calculus of kidney: Secondary | ICD-10-CM

## 2015-01-15 DIAGNOSIS — Z87891 Personal history of nicotine dependence: Secondary | ICD-10-CM | POA: Insufficient documentation

## 2015-01-15 DIAGNOSIS — R109 Unspecified abdominal pain: Secondary | ICD-10-CM

## 2015-01-15 LAB — POCT URINALYSIS DIPSTICK
Bilirubin, UA: NEGATIVE
Glucose, UA: NEGATIVE
Ketones, UA: NEGATIVE
LEUKOCYTES UA: NEGATIVE
NITRITE UA: NEGATIVE
Spec Grav, UA: >=1.03
Urobilinogen, UA: 0.2
pH, UA: 6.5

## 2015-01-15 MED ORDER — ACETAMINOPHEN-CODEINE #3 300-30 MG PO TABS: 1.0000 | ORAL_TABLET | Freq: Four times a day (QID) | ORAL | Status: DC | PRN

## 2015-01-15 MED ORDER — MONTELUKAST SODIUM 10 MG PO TABS: 10.0000 mg | ORAL_TABLET | Freq: Every day | ORAL | Status: DC

## 2015-01-15 NOTE — Progress Notes (Signed)
Patient here for ED follow up for abdominal pain. Patient denies any abdominal pain today. Patient reports she still has some discomfort in her lower abdomen since she has been out of the hospital. Patient reports pain in her lower back, rated at a 5, described as sore. Pain comes and goes and has been present for 3 weeks. Patient reports the back pain is what started her abdominal pain. Patient states that while in the ED she had a CT done that showed she has a kidney stone and fatty infiltration of the liver. Patient would like to discuss those findings. Patient has taken her medications for today. Patient needs a refill on singulair.

## 2015-01-15 NOTE — Patient Instructions (Signed)
Fatty Liver  Fatty liver is the accumulation of fat in liver cells. It is also called hepatosteatosis or steatohepatitis. It is normal for your liver to contain some fat. If fat is more than 5 to 10% of your liver's weight, you have fatty liver.   There are often no symptoms (problems) for years while damage is still occurring. People often learn about their fatty liver when they have medical tests for other reasons. Fat can damage your liver for years or even decades without causing problems. When it becomes severe, it can cause fatigue, weight loss, weakness, and confusion.  This makes you more likely to develop more serious liver problems. The liver is the largest organ in the body. It does a lot of work and often gives no warning signs when it is sick until late in a disease.  The liver has many important jobs including:  · Breaking down foods.  · Storing vitamins, iron, and other minerals.  · Making proteins.  · Making bile for food digestion.  · Breaking down many products including medications, alcohol and some poisons.  CAUSES   There are a number of different conditions, medications, and poisons that can cause a fatty liver. Eating too many calories causes fat to build up in the liver. Not processing and breaking fats down normally may also cause this. Certain conditions, such as obesity, diabetes, and high triglycerides also cause this. Most fatty liver patients tend to be middle-aged and over weight.   Some causes of fatty liver are:  · Alcohol over consumption.  · Malnutrition.  · Steroid use.  · Valproic acid toxicity.  · Obesity.  · Cushing's syndrome.  · Poisons.  · Tetracycline in high dosages.  · Pregnancy.  · Diabetes.  · Hyperlipidemia.  · Rapid weight loss.  Some people develop fatty liver even having none of these conditions.  SYMPTOMS   Fatty liver most often causes no problems. This is called asymptomatic.  · It can be diagnosed with blood tests and also by a liver biopsy.  · It is one of the  most common causes of minor elevations of liver enzymes on routine blood tests.  · Specialized Imaging of the liver using ultrasound, CT (computed tomography) scan, or MRI (magnetic resonance imaging) can suggest a fatty liver but a biopsy is needed to confirm it.  · A biopsy involves taking a small sample of liver tissue. This is done by using a needle. It is then looked at under a microscope by a specialist.  TREATMENT   It is important to treat the cause. Simple fatty liver without a medical reason may not need treatment.  · Weight loss, fat restriction, and exercise in overweight patients produces inconsistent results but is worth trying.  · Fatty liver due to alcohol toxicity may not improve even with stopping drinking.  · Good control of diabetes may reduce fatty liver.  · Lower your triglycerides through diet, medication or both.  · Eat a balanced, healthy diet.  · Increase your physical activity.  · Get regular checkups from a liver specialist.  · There are no medical or surgical treatments for a fatty liver or NASH, but improving your diet and increasing your exercise may help prevent or reverse some of the damage.  PROGNOSIS   Fatty liver may cause no damage or it can lead to an inflammation of the liver. This is, called steatohepatitis. When it is linked to alcohol abuse, it is called alcoholic steatohepatitis. It often   is not linked to alcohol. It is then called nonalcoholic steatohepatitis, or NASH. Over time the liver may become scarred and hardened. This condition is called cirrhosis. Cirrhosis is serious and may lead to liver failure or cancer. NASH is one of the leading causes of cirrhosis. About 10-20% of Americans have fatty liver and a smaller 2-5% has NASH.  Document Released: 07/29/2005 Document Revised: 09/05/2011 Document Reviewed: 10/23/2013  ExitCare® Patient Information ©2015 [employer]. This information is not intended to replace advice given to you by your health care provider. Make  sure you discuss any questions you have with your health care provider.

## 2015-01-15 NOTE — Progress Notes (Signed)
Patient ID: Christina Fox, female   DOB: Jun 19, 1981, 34 y.o.   MRN: [id number]  CC: abdominal pain  HPI: Christina Fox is a 34 y.o. female here today for a follow up visit.  Patient has past medical history of GERD, kidney stones. Patient has had 2 E-visits recently for UTI symptoms (7/8 and 7/11). She was at that time given Macrobid but had a allergic reaction and was switch to Cipro. After not feeling better she later presented to the [facility] with similar symptoms. She had a CT done at that time and was found to have a non obstructing right kidney stone. She reports that she no longer has abdominal pain is now having some lower back pain.  Patient has No headache, No chest pain, No abdominal pain - No Nausea, No new weakness tingling or numbness, No Cough - SOB.  Allergies  Allergen Reactions  . Sulfa Antibiotics Hives and Rash   Past Medical History  Diagnosis Date  . Headache(784.0)     sinus  . GERD (gastroesophageal reflux disease)   . Kidney stones     no current problem  . Anxiety   . Deviated septum 09/2011  . 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  . Complication of anesthesia     states small mouth  . Pelvic inflammatory disease    Current Outpatient Prescriptions on File Prior to Visit  Medication Sig Dispense Refill  . albuterol (PROVENTIL HFA;VENTOLIN HFA) 108 (90 BASE) MCG/ACT inhaler Inhale 1-2 puffs into the lungs every 6 (six) hours as needed for wheezing or shortness of breath. (Patient not taking: Reported on 10/29/2014) 1 Inhaler 0  . ciprofloxacin (CIPRO) 500 MG tablet Take 1 tablet (500 mg total) by mouth 2 (two) times daily. 10 tablet 0  . ciprofloxacin (CIPRO) 500 MG tablet Take 1 tablet (500 mg total) by mouth 2 (two) times daily. 10 tablet 0  . fluticasone (FLONASE) 50 MCG/ACT nasal spray Place 2 sprays into both nostrils daily. 16 g 0  . HYDROcodone-acetaminophen (NORCO/VICODIN) 5-325 MG per tablet Take 2 tablets by mouth every 4 (four)  hours as needed. 6 tablet 0  . ibuprofen (ADVIL,MOTRIN) 200 MG tablet Take 400 mg by mouth every 6 (six) hours as needed for headache or mild pain.    Marland Kitchen ipratropium (ATROVENT) 0.06 % nasal spray Place 2 sprays into both nostrils 4 (four) times daily. (Patient not taking: Reported on 12/30/2014) 15 mL 1  . levonorgestrel-ethinyl estradiol (AUBRA) 0.1-20 MG-MCG tablet Take 1 tablet by mouth daily.    . montelukast (SINGULAIR) 10 MG tablet Take 1 tablet (10 mg total) by mouth daily. 30 tablet 2  . ondansetron (ZOFRAN) 4 MG tablet Take 1 tablet (4 mg total) by mouth every 6 (six) hours. 12 tablet 0  . [DISCONTINUED] escitalopram (LEXAPRO) 10 MG tablet Take 10 mg by mouth daily.       No current facility-administered medications on file prior to visit.   Family History  Problem Relation Age of Onset  . Cancer Mother   . Hypertension Mother   . Hypertension Father   . Liver disease Father   . Cancer Brother   . Hypertension Other    History   Social History  . Marital Status: Single    Spouse Name: N/A  . Number of Children: N/A  . Years of Education: N/A   Occupational History  . Not on file.   Social History Main Topics  . Smoking status: Former Smoker --  0.25 packs/day  . Smokeless tobacco: Never Used  . Alcohol Use: No  . Drug Use: No  . Sexual Activity: Not Currently    Birth Control/ Protection: Pill   Other Topics Concern  . Not on file   Social History Narrative    Review of Systems: See HPI   Objective:   Filed Vitals:   01/15/15 1548  BP: 116/78  Pulse: 79  Temp: 98.1 F (36.7 C)  Resp: 16    Physical Exam  Constitutional: She is oriented to person, place, and time.  Cardiovascular: Normal rate, regular rhythm and normal heart sounds.   Pulmonary/Chest: Effort normal and breath sounds normal.  Abdominal: Soft. Bowel sounds are normal. There is no tenderness.  No CVA tenderness   Neurological: She is alert and oriented to person, place, and time.   Skin: Skin is warm and dry.  .  Lab Results  Component Value Date   WBC 7.1 01/08/2015   HGB 12.5 01/08/2015   HCT 38.0 01/08/2015   MCV 87.4 01/08/2015   PLT 290 01/08/2015   Lab Results  Component Value Date   CREATININE 0.68 01/08/2015   BUN 10 01/08/2015   NA 137 01/08/2015   K 3.7 01/08/2015   CL 105 01/08/2015   CO2 26 01/08/2015    No results found for: HGBA1C Lipid Panel  No results found for: CHOL, TRIG, HDL, CHOLHDL, VLDL, LDLCALC     Assessment and plan:  Sunita was seen today for follow-up and back pain.  Diagnoses and all orders for this visit:  Abdominal pain, unspecified abdominal location Orders: -     acetaminophen-codeine (TYLENOL #3) 300-30 MG per tablet; Take 1 tablet by mouth every 6 (six) hours as needed for moderate pain. -     POCT urinalysis dipstick Likely related to kidney stones. No infection found in urine   Kidney stones Drink plenty of fluids to help pass stones   Return if symptoms worsen or fail to improve.       Lance Bosch, [facility] [phone] 01/15/2015, 3:46 PM

## 2015-02-12 ENCOUNTER — Encounter (HOSPITAL_COMMUNITY): Payer: Self-pay | Admitting: *Deleted

## 2015-02-12 ENCOUNTER — Emergency Department (INDEPENDENT_AMBULATORY_CARE_PROVIDER_SITE_OTHER)
Admission: EM | Admit: 2015-02-12 | Discharge: 2015-02-12 | Disposition: A | Discharge status: Home / Self Care | Payer: No Typology Code available for payment source | Source: Home / Self Care | Attending: Family Medicine | Admitting: Family Medicine

## 2015-02-12 DIAGNOSIS — J0101 Acute recurrent maxillary sinusitis: Secondary | ICD-10-CM

## 2015-02-12 MED ORDER — IPRATROPIUM BROMIDE 0.06 % NA SOLN: 2.0000 | Freq: Four times a day (QID) | NASAL | Status: DC

## 2015-02-12 MED ORDER — AZITHROMYCIN 250 MG PO TABS: ORAL_TABLET | ORAL | Status: DC

## 2015-02-12 NOTE — ED Notes (Signed)
Pt  Reports  Symptoms  Of  Headache           Stuffy  Nose   Sinus  Congestion           With  Coughing  Up phlegm   With onset  Of   Symptoms      Yesterday

## 2015-02-12 NOTE — Discharge Instructions (Signed)
Drink plenty of fluids as discussed, use medicine as prescribed, and mucinex or delsym for cough. Return or see your doctor if further problems °

## 2015-02-12 NOTE — ED Provider Notes (Signed)
CSN: [id number]     Arrival date & time 02/12/15  1734 History   First MD Initiated Contact with Patient 02/12/15 1824     Chief Complaint  Patient presents with  . Headache   (Consider location/radiation/quality/duration/timing/severity/associated sxs/prior Treatment) Patient is a 34 y.o. female presenting with URI. The history is provided by the patient.  URI Presenting symptoms: congestion, ear pain and rhinorrhea   Presenting symptoms: no fever   Severity:  Mild Onset quality:  Gradual Duration:  1 day Chronicity:  New Relieved by:  None tried Worsened by:  Nothing tried Ineffective treatments:  None tried Associated symptoms: sinus pain     Past Medical History  Diagnosis Date  . Headache(784.0)     sinus  . GERD (gastroesophageal reflux disease)   . Kidney stones     no current problem  . Anxiety   . Deviated septum 09/2011  . 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  . Complication of anesthesia     states small mouth  . Pelvic inflammatory disease    Past Surgical History  Procedure Laterality Date  . Polyps removed      colon  . Wisdom tooth extraction    . Nasal septoplasty w/ turbinoplasty  10/04/2011    Procedure: NASAL SEPTOPLASTY WITH TURBINATE REDUCTION;  Surgeon: Ascencion Dike, MD;  Location: [place];  Service: ENT;  Laterality: Bilateral;   Family History  Problem Relation Age of Onset  . Cancer Mother   . Hypertension Mother   . Hypertension Father   . Liver disease Father   . Cancer Brother   . Hypertension Other    Social History  Substance Use Topics  . Smoking status: Former Smoker -- 0.25 packs/day  . Smokeless tobacco: Never Used  . Alcohol Use: No   OB History    Gravida Para Term Preterm AB TAB SAB Ectopic Multiple Living   4 4 4       4      Review of Systems  Constitutional: Negative.  Negative for fever.  HENT: Positive for congestion, ear pain, postnasal drip, rhinorrhea and sinus pressure.   Eyes:  Negative.   Respiratory: Negative.   Cardiovascular: Negative.   Gastrointestinal: Negative.     Allergies  Macrobid and Sulfa antibiotics  Home Medications   Prior to Admission medications   Medication Sig Start Date End Date Taking? Authorizing Provider  acetaminophen-codeine (TYLENOL #3) 300-30 MG per tablet Take 1 tablet by mouth every 6 (six) hours as needed for moderate pain. 01/15/15   Lance Bosch, NP  albuterol (PROVENTIL HFA;VENTOLIN HFA) 108 (90 BASE) MCG/ACT inhaler Inhale 1-2 puffs into the lungs every 6 (six) hours as needed for wheezing or shortness of breath. Patient not taking: Reported on 10/29/2014 07/30/14   Harden Mo, MD  amitriptyline (ELAVIL) 25 MG tablet Take 25 mg by mouth at bedtime as needed for sleep.    Historical Provider, MD  aspirin-acetaminophen-caffeine (EXCEDRIN MIGRAINE) [phone] MG per tablet Take 1 tablet by mouth every 6 (six) hours as needed for headache.    Historical Provider, MD  azithromycin (ZITHROMAX Z-PAK) 250 MG tablet Take as directed on pack 02/12/15   Billy Fischer, MD  ciprofloxacin (CIPRO) 500 MG tablet Take 1 tablet (500 mg total) by mouth 2 (two) times daily. Patient not taking: Reported on 01/15/2015 01/05/15   Sharion Balloon, FNP  ciprofloxacin (CIPRO) 500 MG tablet Take 1 tablet (500 mg total) by mouth 2 (  two) times daily. 01/08/15   Tiffany Carlota Raspberry, PA-C  fluticasone (FLONASE) 50 MCG/ACT nasal spray Place 2 sprays into both nostrils daily. 07/30/14   Harden Mo, MD  HYDROcodone-acetaminophen (NORCO/VICODIN) 5-325 MG per tablet Take 2 tablets by mouth every 4 (four) hours as needed. Patient not taking: Reported on 01/15/2015 01/08/15   Delos Haring, PA-C  ibuprofen (ADVIL,MOTRIN) 200 MG tablet Take 400 mg by mouth every 6 (six) hours as needed for headache or mild pain.    Historical Provider, MD  ipratropium (ATROVENT) 0.06 % nasal spray Place 2 sprays into both nostrils 4 (four) times daily. 02/12/15   Billy Fischer, MD   levonorgestrel-ethinyl estradiol (AUBRA) 0.1-20 MG-MCG tablet Take 1 tablet by mouth daily.    Historical Provider, MD  montelukast (SINGULAIR) 10 MG tablet Take 1 tablet (10 mg total) by mouth daily. 01/15/15 01/15/16  Lance Bosch, NP  ondansetron (ZOFRAN) 4 MG tablet Take 1 tablet (4 mg total) by mouth every 6 (six) hours. 01/08/15   Tiffany Carlota Raspberry, PA-C   BP 132/89 mmHg  Pulse 83  Temp(Src) 97.7 F (36.5 C) (Oral)  Resp 16  SpO2 96% Physical Exam  Constitutional: She is oriented to person, place, and time. She appears well-developed and well-nourished. No distress.  HENT:  Head: Normocephalic.  Right Ear: External ear normal.  Left Ear: External ear normal.  Nose: Nose normal.  Mouth/Throat: Oropharynx is clear and moist.  Eyes: Conjunctivae are normal. Pupils are equal, round, and reactive to light.  Neck: Normal range of motion. Neck supple.  Cardiovascular: Normal heart sounds and intact distal pulses.   Pulmonary/Chest: Effort normal and breath sounds normal.  Lymphadenopathy:    She has no cervical adenopathy.  Neurological: She is alert and oriented to person, place, and time.  Skin: Skin is warm and dry.  Nursing note and vitals reviewed.   ED Course  Procedures (including critical care time) Labs Review Labs Reviewed - No data to display  Imaging Review No results found.   MDM   1. Acute recurrent maxillary sinusitis        Billy Fischer, MD 02/12/15 1850

## 2015-04-05 ENCOUNTER — Encounter (HOSPITAL_COMMUNITY): Payer: Self-pay | Admitting: Emergency Medicine

## 2015-04-05 ENCOUNTER — Emergency Department (INDEPENDENT_AMBULATORY_CARE_PROVIDER_SITE_OTHER)
Admission: EM | Admit: 2015-04-05 | Discharge: 2015-04-05 | Disposition: A | Discharge status: Home / Self Care | Payer: No Typology Code available for payment source | Source: Home / Self Care

## 2015-04-05 DIAGNOSIS — J309 Allergic rhinitis, unspecified: Secondary | ICD-10-CM

## 2015-04-05 DIAGNOSIS — J321 Chronic frontal sinusitis: Secondary | ICD-10-CM

## 2015-04-05 LAB — POCT RAPID STREP A: STREPTOCOCCUS, GROUP A SCREEN (DIRECT): NEGATIVE

## 2015-04-05 MED ORDER — AMOXICILLIN-POT CLAVULANATE 875-125 MG PO TABS: 1.0000 | ORAL_TABLET | Freq: Two times a day (BID) | ORAL | Status: DC

## 2015-04-05 MED ORDER — CETIRIZINE-PSEUDOEPHEDRINE ER 5-120 MG PO TB12: 1.0000 | ORAL_TABLET | Freq: Every day | ORAL | Status: DC

## 2015-04-05 NOTE — ED Notes (Signed)
C/o cold sx onset 5 days Sx include chills, runny nose, congestion, prod cough, laryngitis, ST and bilateral ear pain Taking OTC cold meds w/no relief A&O x4... No acute distress.

## 2015-04-05 NOTE — Discharge Instructions (Signed)
A neti pot is a container designed to rinse debris or mucus from your nasal cavity. You might use a neti pot to treat symptoms of nasal allergies, sinus problems or colds. If you choose to make your own saltwater solution, it's important to use bottled water that has been distilled or sterilized. Tap water is acceptable if it's been boiled for several minutes and then left to cool until it is lukewarm. To use the neti pot, tilt your head sideways over the sink and place the spout of the neti pot in the upper nostril. Breathing through your open mouth, gently pour the saltwater solution into your upper nostril so that the liquid drains through the lower nostril. Repeat on the other side. Be sure to rinse the irrigation device after each use with similarly distilled, sterile, previously boiled and cooled, or filtered water and leave open to air dry. Neti pots are often available in pharmacies and health food stores, as well online.    Allergic Rhinitis Allergic rhinitis is when the mucous membranes in the nose respond to allergens. Allergens are particles in the air that cause your body to have an allergic reaction. This causes you to release allergic antibodies. Through a chain of events, these eventually cause you to release histamine into the blood stream. Although meant to protect the body, it is this release of histamine that causes your discomfort, such as frequent sneezing, congestion, and an itchy, runny nose.  CAUSES Seasonal allergic rhinitis (hay fever) is caused by pollen allergens that may come from grasses, trees, and weeds. Year-round allergic rhinitis (perennial allergic rhinitis) is caused by allergens such as house dust mites, pet dander, and mold spores. SYMPTOMS  Nasal stuffiness (congestion).  Itchy, runny nose with sneezing and tearing of the eyes. DIAGNOSIS Your health care provider can help you determine the allergen or allergens that trigger your symptoms. If you and your  health care provider are unable to determine the allergen, skin or blood testing may be used. Your health care provider will diagnose your condition after taking your health history and performing a physical exam. Your health care provider may assess you for other related conditions, such as asthma, pink eye, or an ear infection. TREATMENT Allergic rhinitis does not have a cure, but it can be controlled by:  Medicines that block allergy symptoms. These may include allergy shots, nasal sprays, and oral antihistamines.  Avoiding the allergen. Hay fever may often be treated with antihistamines in pill or nasal spray forms. Antihistamines block the effects of histamine. There are over-the-counter medicines that may help with nasal congestion and swelling around the eyes. Check with your health care provider before taking or giving this medicine. If avoiding the allergen or the medicine prescribed do not work, there are many new medicines your health care provider can prescribe. Stronger medicine may be used if initial measures are ineffective. Desensitizing injections can be used if medicine and avoidance does not work. Desensitization is when a patient is given ongoing shots until the body becomes less sensitive to the allergen. Make sure you follow up with your health care provider if problems continue. HOME CARE INSTRUCTIONS It is not possible to completely avoid allergens, but you can reduce your symptoms by taking steps to limit your exposure to them. It helps to know exactly what you are allergic to so that you can avoid your specific triggers. SEEK MEDICAL CARE IF:  You have a fever.  You develop a cough that does not stop easily (persistent).  You have shortness of breath.  You start wheezing.  Symptoms interfere with normal daily activities.   This information is not intended to replace advice given to you by your health care provider. Make sure you discuss any questions you have with  your health care provider.   Document Released: 03/08/2001 Document Revised: 07/04/2014 Document Reviewed: 02/18/2013 Elsevier Interactive Patient Education [employer].

## 2015-04-05 NOTE — ED Provider Notes (Signed)
CSN: [id number]     Arrival date & time 04/05/15  1302 History   None    Chief Complaint  Patient presents with  . URI   (Consider location/radiation/quality/duration/timing/severity/associated sxs/prior Treatment)  HPI   The patient is a 34 year old female presenting today with complaints of generalized malaise since this past Wednesday. Patient states she started with a productive cough with green sputum this past Thursday and "just doesn't feel well". Patient denies fever, but reports chills. Patient states she has had some intermittent aching; denies shortness of breath, nausea, vomiting, or diarrhea. States frequent sinus infections 4-6 times a year secondary to environmental allergies. States it has been "better" and she has started taking Singulair daily. Denies SOB, history of asthma, or use of albuterol inhaler.  Past Medical History  Diagnosis Date  . Headache(784.0)     sinus  . GERD (gastroesophageal reflux disease)   . Kidney stones     no current problem  . Anxiety   . Deviated septum 09/2011  . 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  . Complication of anesthesia     states small mouth  . Pelvic inflammatory disease    Past Surgical History  Procedure Laterality Date  . Polyps removed      colon  . Wisdom tooth extraction    . Nasal septoplasty w/ turbinoplasty  10/04/2011    Procedure: NASAL SEPTOPLASTY WITH TURBINATE REDUCTION;  Surgeon: Ascencion Dike, MD;  Location: [facility];  Service: ENT;  Laterality: Bilateral;   Family History  Problem Relation Age of Onset  . Cancer Mother   . Hypertension Mother   . Hypertension Father   . Liver disease Father   . Cancer Brother   . Hypertension Other    Social History  Substance Use Topics  . Smoking status: Former Smoker -- 0.25 packs/day  . Smokeless tobacco: Never Used  . Alcohol Use: No   OB History    Gravida Para Term Preterm AB TAB SAB Ectopic Multiple Living   4 4 4       4       Review of Systems  Constitutional: Positive for chills. Negative for fever.  HENT: Positive for congestion, sinus pressure, sore throat and voice change. Negative for drooling, ear pain, sneezing and trouble swallowing.   Eyes: Negative.   Respiratory: Positive for cough. Negative for shortness of breath and wheezing.   Cardiovascular: Negative.   Gastrointestinal: Negative.   Endocrine: Negative.   Genitourinary: Negative.   Skin: Negative.  Negative for pallor and rash.  Allergic/Immunologic: Positive for environmental allergies. Negative for food allergies and immunocompromised state.  Neurological: Negative.   Hematological: Negative.   Psychiatric/Behavioral: Negative.     Allergies  Macrobid and Sulfa antibiotics  Home Medications   Prior to Admission medications   Medication Sig Start Date End Date Taking? Authorizing Provider  amitriptyline (ELAVIL) 25 MG tablet Take 25 mg by mouth at bedtime as needed for sleep.   Yes Historical Provider, MD  levonorgestrel-ethinyl estradiol (AUBRA) 0.1-20 MG-MCG tablet Take 1 tablet by mouth daily.   Yes Historical Provider, MD  montelukast (SINGULAIR) 10 MG tablet Take 1 tablet (10 mg total) by mouth daily. 01/15/15 01/15/16 Yes Lance Bosch, NP  acetaminophen-codeine (TYLENOL #3) 300-30 MG per tablet Take 1 tablet by mouth every 6 (six) hours as needed for moderate pain. 01/15/15   Lance Bosch, NP  albuterol (PROVENTIL HFA;VENTOLIN HFA) 108 (90 BASE) MCG/ACT inhaler Inhale 1-2 puffs  into the lungs every 6 (six) hours as needed for wheezing or shortness of breath. Patient not taking: Reported on 10/29/2014 07/30/14   Harden Mo, MD  amoxicillin-clavulanate (AUGMENTIN) 875-125 MG tablet Take 1 tablet by mouth every 12 (twelve) hours. 04/05/15   Nehemiah Settle, NP  aspirin-acetaminophen-caffeine (EXCEDRIN MIGRAINE) [phone] MG per tablet Take 1 tablet by mouth every 6 (six) hours as needed for headache.    Historical Provider, MD   azithromycin (ZITHROMAX Z-PAK) 250 MG tablet Take as directed on pack 02/12/15   Billy Fischer, MD  cetirizine-pseudoephedrine (ZYRTEC-D) 5-120 MG tablet Take 1 tablet by mouth daily. 04/05/15   Nehemiah Settle, NP  ciprofloxacin (CIPRO) 500 MG tablet Take 1 tablet (500 mg total) by mouth 2 (two) times daily. Patient not taking: Reported on 01/15/2015 01/05/15   Sharion Balloon, FNP  ciprofloxacin (CIPRO) 500 MG tablet Take 1 tablet (500 mg total) by mouth 2 (two) times daily. 01/08/15   Tiffany Carlota Raspberry, PA-C  fluticasone (FLONASE) 50 MCG/ACT nasal spray Place 2 sprays into both nostrils daily. 07/30/14   Harden Mo, MD  HYDROcodone-acetaminophen (NORCO/VICODIN) 5-325 MG per tablet Take 2 tablets by mouth every 4 (four) hours as needed. Patient not taking: Reported on 01/15/2015 01/08/15   Delos Haring, PA-C  ibuprofen (ADVIL,MOTRIN) 200 MG tablet Take 400 mg by mouth every 6 (six) hours as needed for headache or mild pain.    Historical Provider, MD  ipratropium (ATROVENT) 0.06 % nasal spray Place 2 sprays into both nostrils 4 (four) times daily. 02/12/15   Billy Fischer, MD  ondansetron (ZOFRAN) 4 MG tablet Take 1 tablet (4 mg total) by mouth every 6 (six) hours. 01/08/15   Delos Haring, PA-C   Meds Ordered and Administered this Visit  Medications - No data to display  BP 151/90 mmHg  Pulse 98  Temp(Src) 98.2 F (36.8 C) (Oral)  Resp 18  SpO2 97% No data found.   Physical Exam  Constitutional: She appears well-developed and well-nourished. No distress.  HENT:  Head: Normocephalic and atraumatic.  Right Ear: External ear normal.  Left Ear: External ear normal.  Mouth/Throat: Oropharynx is clear and moist.  Cobblestoning pattern noted in posterior oropharynx mucoid discharge present. Negative for exudate or patches. Petechiae present on uvula.  Bilateral tympanic membranes pearly gray in appearance with light reflexes present and bony prominences visualized.  Bilateral turbinates  swollen and boggy gray in appearance.    Eyes: Conjunctivae are normal. Pupils are equal, round, and reactive to light. Right eye exhibits no discharge. Left eye exhibits no discharge. No scleral icterus.  Neck: Normal range of motion. Neck supple. No thyromegaly present.  Mild anterior cervical lymphadenopathy present bilaterally. Negative for nuchal rigidity.  Cardiovascular: Normal rate, regular rhythm, normal heart sounds and intact distal pulses.  Exam reveals no gallop and no friction rub.   No murmur heard. Pulmonary/Chest: Effort normal and breath sounds normal. No respiratory distress. She has no wheezes. She has no rales. She exhibits no tenderness.  Lymphadenopathy:    She has cervical adenopathy.  Skin: Skin is warm and dry. No rash noted. She is not diaphoretic.  Nursing note and vitals reviewed.   ED Course  Procedures (including critical care time)  Labs Review Labs Reviewed  POCT RAPID STREP A   Results for orders placed or performed during the hospital encounter of 04/05/15  POCT rapid strep A [facility])  Result Value Ref Range   Streptococcus, Group A  Screen (Direct) NEGATIVE NEGATIVE   Imaging Review No results found.  Meds ordered this encounter  Medications  . cetirizine-pseudoephedrine (ZYRTEC-D) 5-120 MG tablet    Sig: Take 1 tablet by mouth daily.    Dispense:  15 tablet    Refill:  0  . amoxicillin-clavulanate (AUGMENTIN) 875-125 MG tablet    Sig: Take 1 tablet by mouth every 12 (twelve) hours.    Dispense:  14 tablet    Refill:  0     MDM   1. Chronic frontal sinusitis   2. Allergic rhinitis, unspecified allergic rhinitis type    Discussed watchful waiting with antibiotic use and net pot as part of prevention.  The patient verbalizes understanding and agrees to plan of care.       Nehemiah Settle, NP 04/05/15 1415

## 2015-04-08 LAB — CULTURE, GROUP A STREP

## 2015-04-17 ENCOUNTER — Other Ambulatory Visit: Payer: Self-pay | Admitting: Internal Medicine

## 2015-04-26 ENCOUNTER — Encounter (HOSPITAL_BASED_OUTPATIENT_CLINIC_OR_DEPARTMENT_OTHER): Payer: Self-pay | Admitting: Emergency Medicine

## 2015-04-26 ENCOUNTER — Emergency Department (HOSPITAL_BASED_OUTPATIENT_CLINIC_OR_DEPARTMENT_OTHER)
Admission: EM | Admit: 2015-04-26 | Discharge: 2015-04-26 | Disposition: A | Discharge status: Home / Self Care | Payer: No Typology Code available for payment source | Attending: Emergency Medicine | Admitting: Emergency Medicine

## 2015-04-26 DIAGNOSIS — F419 Anxiety disorder, unspecified: Secondary | ICD-10-CM | POA: Insufficient documentation

## 2015-04-26 DIAGNOSIS — Z87891 Personal history of nicotine dependence: Secondary | ICD-10-CM | POA: Insufficient documentation

## 2015-04-26 DIAGNOSIS — Z793 Long term (current) use of hormonal contraceptives: Secondary | ICD-10-CM | POA: Insufficient documentation

## 2015-04-26 DIAGNOSIS — Z8742 Personal history of other diseases of the female genital tract: Secondary | ICD-10-CM | POA: Insufficient documentation

## 2015-04-26 DIAGNOSIS — Z8709 Personal history of other diseases of the respiratory system: Secondary | ICD-10-CM | POA: Insufficient documentation

## 2015-04-26 DIAGNOSIS — Z792 Long term (current) use of antibiotics: Secondary | ICD-10-CM | POA: Insufficient documentation

## 2015-04-26 DIAGNOSIS — Z79899 Other long term (current) drug therapy: Secondary | ICD-10-CM | POA: Insufficient documentation

## 2015-04-26 DIAGNOSIS — Z8719 Personal history of other diseases of the digestive system: Secondary | ICD-10-CM | POA: Insufficient documentation

## 2015-04-26 DIAGNOSIS — Z7951 Long term (current) use of inhaled steroids: Secondary | ICD-10-CM | POA: Insufficient documentation

## 2015-04-26 DIAGNOSIS — M79602 Pain in left arm: Secondary | ICD-10-CM | POA: Insufficient documentation

## 2015-04-26 DIAGNOSIS — Z87442 Personal history of urinary calculi: Secondary | ICD-10-CM | POA: Insufficient documentation

## 2015-04-26 MED ORDER — IBUPROFEN 200 MG PO TABS: 400.0000 mg | ORAL_TABLET | Freq: Four times a day (QID) | ORAL | Status: DC | PRN

## 2015-04-26 MED ORDER — TRAMADOL HCL 50 MG PO TABS: 50.0000 mg | ORAL_TABLET | Freq: Four times a day (QID) | ORAL | Status: DC | PRN

## 2015-04-26 NOTE — ED Provider Notes (Signed)
CSN: [id number]     Arrival date & time 04/26/15  1620 History  By signing my name below, I, Stephania Fragmin, attest that this documentation has been prepared under the direction and in the presence of Merrily Pew, MD. Electronically Signed: Stephania Fragmin, ED Scribe. 04/26/2015. 4:45 PM.   Chief Complaint  Patient presents with  . Arm Pain   The history is provided by the patient. No language interpreter was used.    HPI Comments: Christina Fox is a 34 y.o. female who presents to the Emergency Department complaining of sudden-onset, intermittent, sharp left arm pain that radiates from her shoulder down to her hand that began two nights ago while she was sleeping. She denies any known trauma, injury, or heavy lifting. She reports the pain onset today while relaxing on the couch. She reports she cried last night and had difficulty sleeping secondary to pain. She denies weakness but states she has had difficulty using her arm secondary to the pain. Touch exacerbates her pain. Patient had taken Tylenol #3 for a kidney stone in the past, which helped her sleep but did not alleviate her pain when she woke up. She tried ibuprofen and rest, with minimal relief. She denies any other pains. Patient denies any estrogen therapies.  She notes a history of kidneys stones and fatty liver disease. Patient had chest pain 3 days ago, which she attributes to anxiety controlled by medications. She notes chronic bruising on her BUE from getting her blood drawn regularly. She denies any swelling, fever, rash, cough, SOB, diarrhea, constipation, or vision changes.   Past Medical History  Diagnosis Date  . Headache(784.0)     sinus  . GERD (gastroesophageal reflux disease)   . Kidney stones     no current problem  . Anxiety   . Deviated septum 09/2011  . 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  . Complication of anesthesia     states small mouth  . Pelvic inflammatory disease    Past Surgical History   Procedure Laterality Date  . Polyps removed      colon  . Wisdom tooth extraction    . Nasal septoplasty w/ turbinoplasty  10/04/2011    Procedure: NASAL SEPTOPLASTY WITH TURBINATE REDUCTION;  Surgeon: Ascencion Dike, MD;  Location: [facility];  Service: ENT;  Laterality: Bilateral;   Family History  Problem Relation Age of Onset  . Cancer Mother   . Hypertension Mother   . Hypertension Father   . Liver disease Father   . Cancer Brother   . Hypertension Other    Social History  Substance Use Topics  . Smoking status: Former Smoker -- 0.25 packs/day  . Smokeless tobacco: Never Used  . Alcohol Use: No   OB History    Gravida Para Term Preterm AB TAB SAB Ectopic Multiple Living   4 4 4       4      Review of Systems  Constitutional: Negative for fever.  Eyes: Negative for visual disturbance.  Respiratory: Negative for cough and shortness of breath.   Cardiovascular: Positive for chest pain (resolved, pt attributes to anxiety).  Gastrointestinal: Negative for diarrhea and constipation.  Skin: Negative for rash.  All other systems reviewed and are negative.     Allergies  Macrobid and Sulfa antibiotics  Home Medications   Prior to Admission medications   Medication Sig Start Date End Date Taking? Authorizing Provider  acetaminophen-codeine (TYLENOL #3) 300-30 MG per tablet Take  1 tablet by mouth every 6 (six) hours as needed for moderate pain. 01/15/15   Lance Bosch, NP  albuterol (PROVENTIL HFA;VENTOLIN HFA) 108 (90 BASE) MCG/ACT inhaler Inhale 1-2 puffs into the lungs every 6 (six) hours as needed for wheezing or shortness of breath. Patient not taking: Reported on 10/29/2014 07/30/14   Harden Mo, MD  amitriptyline (ELAVIL) 25 MG tablet Take 25 mg by mouth at bedtime as needed for sleep.    Historical Provider, MD  amoxicillin-clavulanate (AUGMENTIN) 875-125 MG tablet Take 1 tablet by mouth every 12 (twelve) hours. 04/05/15   Nehemiah Settle, NP   aspirin-acetaminophen-caffeine (EXCEDRIN MIGRAINE) [phone] MG per tablet Take 1 tablet by mouth every 6 (six) hours as needed for headache.    Historical Provider, MD  azithromycin (ZITHROMAX Z-PAK) 250 MG tablet Take as directed on pack 02/12/15   Billy Fischer, MD  cetirizine-pseudoephedrine (ZYRTEC-D) 5-120 MG tablet Take 1 tablet by mouth daily. 04/05/15   Nehemiah Settle, NP  ciprofloxacin (CIPRO) 500 MG tablet Take 1 tablet (500 mg total) by mouth 2 (two) times daily. Patient not taking: Reported on 01/15/2015 01/05/15   Sharion Balloon, FNP  ciprofloxacin (CIPRO) 500 MG tablet Take 1 tablet (500 mg total) by mouth 2 (two) times daily. 01/08/15   Tiffany Carlota Raspberry, PA-C  fluticasone (FLONASE) 50 MCG/ACT nasal spray Place 2 sprays into both nostrils daily. 07/30/14   Harden Mo, MD  HYDROcodone-acetaminophen (NORCO/VICODIN) 5-325 MG per tablet Take 2 tablets by mouth every 4 (four) hours as needed. Patient not taking: Reported on 01/15/2015 01/08/15   Delos Haring, PA-C  ibuprofen (ADVIL,MOTRIN) 200 MG tablet Take 400 mg by mouth every 6 (six) hours as needed for headache or mild pain.    Historical Provider, MD  ipratropium (ATROVENT) 0.06 % nasal spray Place 2 sprays into both nostrils 4 (four) times daily. 02/12/15   Billy Fischer, MD  levonorgestrel-ethinyl estradiol (AUBRA) 0.1-20 MG-MCG tablet Take 1 tablet by mouth daily.    Historical Provider, MD  montelukast (SINGULAIR) 10 MG tablet Take 1 tablet (10 mg total) by mouth daily. 01/15/15 01/15/16  Lance Bosch, NP  ondansetron (ZOFRAN) 4 MG tablet Take 1 tablet (4 mg total) by mouth every 6 (six) hours. 01/08/15   Tiffany Carlota Raspberry, PA-C   BP 118/91 mmHg  Pulse 92  Temp(Src) 98.3 F (36.8 C) (Oral)  Resp 18  Ht 5\' 2"  (1.575 m)  Wt 197 lb (89.359 kg)  BMI 36.02 kg/m2  SpO2 98% Physical Exam  Constitutional: She is oriented to person, place, and time. She appears well-developed and well-nourished. No distress.  HENT:  Head:  Normocephalic and atraumatic.  Eyes: Conjunctivae and EOM are normal.  Neck: Neck supple. No tracheal deviation present.  Cardiovascular: Normal rate.   Pulmonary/Chest: Effort normal. No respiratory distress.  Musculoskeletal: Normal range of motion.  Radial pulses 2+. 5/5 strength to BUE. Sensation is intact.  Neurological: She is alert and oriented to person, place, and time.  Skin: Skin is warm and dry.  Psychiatric: She has a normal mood and affect. Her behavior is normal.  Nursing note and vitals reviewed.   ED Course  Procedures (including critical care time)  DIAGNOSTIC STUDIES: Oxygen Saturation is 98% on RA, normal by my interpretation.    COORDINATION OF CARE: 4:43 PM - Discussed treatment plan with pt at bedside which includes f/u with PCP if pain does not resolve in 2 days. Pt verbalized understanding and agreed to plan.  MDM   Final diagnoses:  Pain of left upper extremity   34 year old female with left arm pain and radiation of her shoulder nontender hand unknown etiology. Worsens in sleep. No significant swelling. Pain is on the lateral part of her arm. There is no erythema or tenderness in those areas. She is neurovascularly intact distally. I'm unsure of the cause of her symptoms however don't feel that emergent. I don't think that she has DVT, cellulitis, nerve impingement or other emergent causes for her symptoms this time. I'll treat her symptomatically and discharge with PCP follow-up.  I have personally and contemperaneously reviewed labs and imaging and used in my decision making as above.   A medical screening exam was performed and I feel the patient has had an appropriate workup for their chief complaint at this time and likelihood of emergent condition existing is low. They have been counseled on decision, discharge, follow up and which symptoms necessitate immediate return to the emergency department. They or their family verbally stated understanding  and agreement with plan and discharged in stable condition.   I personally performed the services described in this documentation, which was scribed in my presence. The recorded information has been reviewed and is accurate.     Merrily Pew, MD 04/26/15 [phone]

## 2015-04-26 NOTE — ED Notes (Signed)
Pt in c/o intermittent sharp shooting pain in L arm that radiates down to hand. States it wakes her up at night. VSS, NAD.

## 2015-05-13 ENCOUNTER — Encounter (HOSPITAL_COMMUNITY): Payer: Self-pay | Admitting: Emergency Medicine

## 2015-05-13 ENCOUNTER — Emergency Department (INDEPENDENT_AMBULATORY_CARE_PROVIDER_SITE_OTHER)
Admission: EM | Admit: 2015-05-13 | Discharge: 2015-05-13 | Disposition: A | Discharge status: Home / Self Care | Payer: Self-pay | Source: Home / Self Care

## 2015-05-13 DIAGNOSIS — R3129 Other microscopic hematuria: Secondary | ICD-10-CM

## 2015-05-13 DIAGNOSIS — M545 Low back pain, unspecified: Secondary | ICD-10-CM

## 2015-05-13 LAB — POCT URINALYSIS DIP (DEVICE)
BILIRUBIN URINE: NEGATIVE
Glucose, UA: NEGATIVE mg/dL
KETONES UR: NEGATIVE mg/dL
NITRITE: NEGATIVE
Protein, ur: NEGATIVE mg/dL
Specific Gravity, Urine: 1.015 (ref 1.005–1.030)
Urobilinogen, UA: 0.2 mg/dL (ref 0.0–1.0)
pH: 6.5 (ref 5.0–8.0)

## 2015-05-13 NOTE — ED Provider Notes (Signed)
CSN: [id number]     Arrival date & time 05/13/15  1259 History   None    No chief complaint on file.  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 Headache(784.0)     sinus  . GERD (gastroesophageal reflux disease)   . Kidney stones     no current problem  . Anxiety   . Deviated septum 09/2011  . 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  . Complication of anesthesia     states small mouth  . Pelvic inflammatory disease    Past Surgical History  Procedure Laterality Date  . Polyps removed      colon  . Wisdom tooth extraction    . Nasal septoplasty w/ turbinoplasty  10/04/2011    Procedure: NASAL SEPTOPLASTY WITH TURBINATE REDUCTION;  Surgeon: Ascencion Dike, MD;  Location: [facility];  Service: ENT;  Laterality: Bilateral;   Family History  Problem Relation Age of Onset  . Cancer Mother   . Hypertension Mother   . Hypertension Father   . Liver disease Father   . Cancer Brother   . Hypertension Other    Social History  Substance Use Topics  . Smoking status: Former Smoker -- 0.25 packs/day  . Smokeless tobacco: Never Used  . Alcohol Use: No   OB History    Gravida Para Term Preterm AB TAB SAB Ectopic Multiple Living   4 4 4       4      Review of Systems ROS +'ve back pain  Denies: HEADACHE, NAUSEA, ABDOMINAL PAIN, CHEST PAIN, CONGESTION, DYSURIA, SHORTNESS OF BREATH  Allergies  Macrobid and Sulfa antibiotics  Home Medications   Prior to Admission medications   Medication Sig Start Date End Date Taking? Authorizing Provider    acetaminophen-codeine (TYLENOL #3) 300-30 MG per tablet Take 1 tablet by mouth every 6 (six) hours as needed for moderate pain. 01/15/15   Lance Bosch, NP  albuterol (PROVENTIL HFA;VENTOLIN HFA) 108 (90 BASE) MCG/ACT inhaler Inhale 1-2 puffs into the lungs every 6 (six) hours as needed for wheezing or shortness of breath. Patient not taking: Reported on 10/29/2014 07/30/14   Harden Mo, MD  amitriptyline (ELAVIL) 25 MG tablet Take 25 mg by mouth at bedtime as needed for sleep.    Historical Provider, MD  amoxicillin-clavulanate (AUGMENTIN) 875-125 MG tablet Take 1 tablet by mouth every 12 (twelve) hours. 04/05/15   Nehemiah Settle, NP  aspirin-acetaminophen-caffeine (EXCEDRIN MIGRAINE) [phone] MG per tablet Take 1 tablet by mouth every 6 (six) hours as needed for headache.    Historical Provider, MD  azithromycin (ZITHROMAX Z-PAK) 250 MG tablet Take as directed on pack 02/12/15   Billy Fischer, MD  cetirizine-pseudoephedrine (ZYRTEC-D) 5-120 MG tablet Take 1 tablet by mouth daily. 04/05/15   Nehemiah Settle, NP  fluticasone (FLONASE) 50 MCG/ACT nasal spray Place 2 sprays into both nostrils daily. 07/30/14   Harden Mo, MD  ibuprofen (ADVIL,MOTRIN) 200 MG tablet Take 2 tablets (400 mg total) by mouth every 6 (six) hours as needed for headache or mild pain. 04/26/15   Merrily Pew, MD  ipratropium (ATROVENT) 0.06 % nasal spray Place 2 sprays into both nostrils  4 (four) times daily. 02/12/15   Billy Fischer, MD  levonorgestrel-ethinyl estradiol (AUBRA) 0.1-20 MG-MCG tablet Take 1 tablet by mouth daily.    Historical Provider, MD  montelukast (SINGULAIR) 10 MG tablet TAKE 1 TABLET BY MOUTH DAILY 05/06/15   Lance Bosch, NP  ondansetron (ZOFRAN) 4 MG tablet Take 1 tablet (4 mg total) by mouth every 6 (six) hours. 01/08/15   Tiffany Carlota Raspberry, PA-C  traMADol (ULTRAM) 50 MG tablet Take 1 tablet (50 mg total) by mouth every 6 (six) hours as needed. 04/26/15   Merrily Pew, MD   Meds Ordered and  Administered this Visit  Medications - No data to display  There were no vitals taken for this visit. No data found.   Physical Exam  Constitutional: She is oriented to person, place, and time. She appears well-developed and well-nourished.  HENT:  Head: Normocephalic and atraumatic.  Neck:  There is asymmetry of the adipose tissue along the right clavicle. Nontender   suggestive of a lipoma.  Pulmonary/Chest: Effort normal.  Abdominal: Soft. Bowel sounds are normal. There is no tenderness.  No CVA tenderness.  Musculoskeletal:       Back:  Neurological: She is alert and oriented to person, place, and time.  Skin: Skin is warm and dry.  Psychiatric: She has a normal mood and affect. Her behavior is normal. Judgment and thought content normal.  Nursing note and vitals reviewed.   ED Course  Procedures (including critical care time)  Labs Review Labs Reviewed  POCT URINALYSIS DIP (DEVICE) - Abnormal; Notable for the following:    Hgb urine dipstick TRACE (*)    Leukocytes, UA SMALL (*)    All other components within normal limits    Imaging Review No results found.   Visual Acuity Review  Right Eye Distance:   Left Eye Distance:   Bilateral Distance:    Right Eye Near:   Left Eye Near:    Bilateral Near:         MDM   1. Midline low back pain without sciatica   2. Hematuria, microscopic     Urinalysis shows a trace of hemoglobin. Patient asked about kidney stone I have advised patient it is not likely that with a trace of urine she would have a active stone at this time. She states that she was told that she had a stone in her kidney. I have tried to explain to the patient that if the stone is in the kidney parenchyma that it is not going to cause her pain and that her pain is  more consistent with musculoskeletal pain. Patient states that she is planning to take herself to the emergency department because she needs to definitively know whether she has a  kidney stone or not. Patient is discharged at this time and instructions of care provided.    Konrad Felix, PA 05/13/15 [phone]

## 2015-05-13 NOTE — Discharge Instructions (Signed)
Urine test does have the parent of blood. This is not definitive for a kidney stone. Your pain is more musculoskeletal. We did not have the advanced testing such as MRI or CT scan here at urgent care that can give you a more definitive diagnosis. You are free to attend  the emergency department if he wanted more clarification of your complaints.

## 2015-05-13 NOTE — ED Notes (Signed)
Reports lower right back pain for 2 weeks.   This episode of back pain is like no other back pain episode she has had.  Pain is constant, pain waxes and wanes.  Patient reports pain radiates from back to the top of right thigh.  Pain worse with sitting.  Reports she is getting up at night to urinate.  History of kidney stone.

## 2015-05-14 ENCOUNTER — Emergency Department: Payer: No Typology Code available for payment source

## 2015-05-14 ENCOUNTER — Emergency Department
Admission: EM | Admit: 2015-05-14 | Discharge: 2015-05-14 | Disposition: A | Discharge status: Home / Self Care | Payer: No Typology Code available for payment source | Attending: Emergency Medicine | Admitting: Emergency Medicine

## 2015-05-14 DIAGNOSIS — Z79899 Other long term (current) drug therapy: Secondary | ICD-10-CM | POA: Insufficient documentation

## 2015-05-14 DIAGNOSIS — R109 Unspecified abdominal pain: Secondary | ICD-10-CM

## 2015-05-14 DIAGNOSIS — M545 Low back pain: Secondary | ICD-10-CM

## 2015-05-14 DIAGNOSIS — Z3202 Encounter for pregnancy test, result negative: Secondary | ICD-10-CM | POA: Insufficient documentation

## 2015-05-14 DIAGNOSIS — Z87891 Personal history of nicotine dependence: Secondary | ICD-10-CM | POA: Insufficient documentation

## 2015-05-14 DIAGNOSIS — Z792 Long term (current) use of antibiotics: Secondary | ICD-10-CM | POA: Insufficient documentation

## 2015-05-14 LAB — COMPREHENSIVE METABOLIC PANEL
ALK PHOS: 56 U/L (ref 38–126)
ALT: 21 U/L (ref 14–54)
AST: 20 U/L (ref 15–41)
Albumin: 3.9 g/dL (ref 3.5–5.0)
Anion gap: 9 (ref 5–15)
BUN: 7 mg/dL (ref 6–20)
CHLORIDE: 104 mmol/L (ref 101–111)
CO2: 26 mmol/L (ref 22–32)
CREATININE: 0.67 mg/dL (ref 0.44–1.00)
Calcium: 9.1 mg/dL (ref 8.9–10.3)
GFR calc Af Amer: >60 mL/min (ref >60)
GFR calc non Af Amer: >60 mL/min (ref >60)
GLUCOSE: 96 mg/dL (ref 65–99)
Potassium: 3.3 mmol/L — ABNORMAL LOW (ref 3.5–5.1)
SODIUM: 139 mmol/L (ref 135–145)
Total Bilirubin: 0.6 mg/dL (ref 0.3–1.2)
Total Protein: 7.7 g/dL (ref 6.5–8.1)

## 2015-05-14 LAB — CBC WITH DIFFERENTIAL/PLATELET
Basophils Absolute: 0 10*3/uL (ref 0–0.1)
Basophils Relative: 1 %
EOS PCT: 5 %
Eosinophils Absolute: 0.3 10*3/uL (ref 0–0.7)
HCT: 41.2 % (ref 35.0–47.0)
Hemoglobin: 13.7 g/dL (ref 12.0–16.0)
LYMPHS ABS: 1.2 10*3/uL (ref 1.0–3.6)
LYMPHS PCT: 18 %
MCH: 28.9 pg (ref 26.0–34.0)
MCHC: 33.3 g/dL (ref 32.0–36.0)
MCV: 86.8 fL (ref 80.0–100.0)
MONO ABS: 0.5 10*3/uL (ref 0.2–0.9)
MONOS PCT: 8 %
Neutro Abs: 4.6 10*3/uL (ref 1.4–6.5)
Neutrophils Relative %: 68 %
PLATELETS: 275 10*3/uL (ref 150–440)
RBC: 4.75 MIL/uL (ref 3.80–5.20)
RDW: 12.4 % (ref 11.5–14.5)
WBC: 6.7 10*3/uL (ref 3.6–11.0)

## 2015-05-14 LAB — URINALYSIS COMPLETE WITH MICROSCOPIC (ARMC ONLY)
Bacteria, UA: NONE SEEN
Bilirubin Urine: NEGATIVE
Glucose, UA: NEGATIVE mg/dL
Hgb urine dipstick: NEGATIVE
Ketones, ur: NEGATIVE mg/dL
LEUKOCYTES UA: NEGATIVE
NITRITE: NEGATIVE
PH: 7 (ref 5.0–8.0)
PROTEIN: NEGATIVE mg/dL
Specific Gravity, Urine: 1.018 (ref 1.005–1.030)

## 2015-05-14 LAB — LIPASE, BLOOD: Lipase: 28 U/L (ref 11–51)

## 2015-05-14 LAB — POCT PREGNANCY, URINE: Preg Test, Ur: NEGATIVE

## 2015-05-14 MED ORDER — KETOROLAC TROMETHAMINE 30 MG/ML IJ SOLN
30.0000 mg | Freq: Once | INTRAMUSCULAR | Status: AC
  Administered 2015-05-14: 30 mg via INTRAVENOUS
  Filled 2015-05-14: qty 1

## 2015-05-14 MED ORDER — CYCLOBENZAPRINE HCL 5 MG PO TABS: 5.0000 mg | ORAL_TABLET | Freq: Three times a day (TID) | ORAL | Status: DC | PRN

## 2015-05-14 NOTE — Discharge Instructions (Signed)
Back Injury Prevention  Back injuries can be very painful. They can also be difficult to heal. After having one back injury, you are more likely to injure your back again. It is important to learn how to avoid injuring or re-injuring your back. The following tips can help you to prevent a back injury.  WHAT SHOULD I KNOW ABOUT PHYSICAL FITNESS?  · Exercise for 30 minutes per day on most days of the week or as told by your doctor. Make sure to:  ¨ Do aerobic exercises, such as walking, jogging, biking, or swimming.  ¨ Do exercises that increase balance and strength, such as tai chi and yoga.  ¨ Do stretching exercises. This helps with flexibility.  ¨ Try to develop strong belly (abdominal) muscles. Your belly muscles help to support your back.  · Stay at a healthy weight. This helps to decrease your risk of a back injury.  WHAT SHOULD I KNOW ABOUT MY DIET?  · Talk with your doctor about your overall diet. Take supplements and vitamins only as told by your doctor.  · Talk with your doctor about how much calcium and vitamin D you need each day. These nutrients help to prevent weakening of the bones (osteoporosis).  · Include good sources of calcium in your diet, such as:    Dairy products.    Green leafy vegetables.    Products that have had calcium added to them (fortified).  · Include good sources of vitamin D in your diet, such as:    Milk.    Foods that have had vitamin D added to them.  WHAT SHOULD I KNOW ABOUT MY POSTURE?  · Sit up straight and stand up straight. Avoid leaning forward when you sit or hunching over when you stand.  · Choose chairs that have good low-back (lumbar) support.  · If you work at a desk, sit close to it so you do not need to lean over. Keep your chin tucked in. Keep your neck drawn back. Keep your elbows bent so your arms look like the letter "L" (right angle).  · Sit high and close to the steering wheel when you drive. Add a low-back support to your car seat, if needed.  · Avoid sitting  or standing in one position for very long. Take breaks to get up, stretch, and walk around at least one time every hour. Take breaks every hour if you are driving for long periods of time.  · Sleep on your side with your knees slightly bent, or sleep on your back with a pillow under your knees. Do not lie on the front of your body to sleep.  WHAT SHOULD I KNOW ABOUT LIFTING, TWISTING, AND REACHING  Lifting and Heavy Lifting   · Avoid heavy lifting, especially lifting over and over again. If you must do heavy lifting:    Stretch before lifting.    Work slowly.    Rest between lifts.    Use a tool such as a cart or a dolly to move objects if one is available.    Make several small trips instead of carrying one heavy load.    Ask for help when you need it, especially when moving big objects.  · Follow these steps when lifting:    Stand with your feet shoulder-width apart.    Get as close to the object as you can. Do not pick up a heavy object that is far from your body.    Use handles or lifting   as close to the center of your body as possible.  Follow these steps when putting down a heavy load:  Stand with your feet shoulder-width apart.  Lower the object slowly while you tighten the muscles in your legs, belly, and butt. Keep the object as close to the center of your body as possible.  Keep your shoulders back. Keep your chin tucked in. Keep your back straight.  Bend at your knees. Squat down, but keep your heels off the floor.  Use handles or lifting straps if they are available. Twisting and Reaching  Avoid lifting heavy objects above your waist.  Do not twist at your waist while you are lifting or carrying a load. If  you need to turn, move your feet.  Do not bend over without bending at your knees.  Avoid reaching over your head, across a table, or for an object on a high surface.  WHAT ARE SOME OTHER TIPS?  Avoid wet floors and icy ground. Keep sidewalks clear of ice to prevent falls.   Do not sleep on a mattress that is too soft or too hard.   Keep items that you use often within easy reach.   Put heavier objects on shelves at waist level, and put lighter objects on lower or higher shelves.  Find ways to lower your stress, such as:  Exercise.  Massage.  Relaxation techniques.  Talk with your doctor if you feel anxious or depressed. These conditions can make back pain worse.  Wear flat heel shoes with cushioned soles.  Avoid making quick (sudden) movements.  Use both shoulder straps when carrying a backpack.  Do not use any tobacco products, including cigarettes, chewing tobacco, or electronic cigarettes. If you need help quitting, ask your doctor.   This information is not intended to replace advice given to you by your health care provider. Make sure you discuss any questions you have with your health care provider.   Document Released: 11/30/2007 Document Revised: 10/28/2014 Document Reviewed: 06/17/2014 Elsevier Interactive Patient Education [employer].

## 2015-05-14 NOTE — ED Notes (Addendum)
Patient ambulatory to triage with steady gait, without difficulty or distress noted; pt reports x 2wks having lower back pain with no accomp symptoms; st pain increased to mid lower back; seen at Urgent Care yesterday and told likely due to musculoskeletal pain and not kidney stone and rec MRI or CT scan

## 2015-05-14 NOTE — ED Provider Notes (Addendum)
[facility] Emergency Department Provider Note  ____________________________________________   I have reviewed the triage vital signs and the nursing notes.   HISTORY  Chief Complaint Back Pain    HPI Torin A Terman is a 34 y.o. female presents today complaining of low back pain. She states that it's been off-and-on since July. She has no numbness or weakness or incontinence of bowel or bladder. She states that yesterday she was leaning into get something out of her purse and she twisted funny and tweaked her back she states. She did not fall. She has had no significant flank pain. She is however very concerned that she is moving a kidney stone because she does have a history of kidney stones and she feels that this might be one of them. She has not had any nausea or vomiting. She denies dysuria or urinary frequency. Sometimes the pain seems to shoot towards her right hip.  Past Medical History  Diagnosis Date  . Headache(784.0)     sinus  . GERD (gastroesophageal reflux disease)   . Kidney stones     no current problem  . Anxiety   . Deviated septum 09/2011  . 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  . Complication of anesthesia     states small mouth  . Pelvic inflammatory disease     Patient Active Problem List   Diagnosis Date Noted  . 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 Anxiety disorder 11/03/2014    Past Surgical History  Procedure Laterality Date  . Polyps removed      colon  . Wisdom tooth extraction    . Nasal septoplasty w/ turbinoplasty  10/04/2011    Procedure: NASAL SEPTOPLASTY WITH TURBINATE REDUCTION;  Surgeon: Ascencion Dike, MD;  Location: [place];  Service: ENT;  Laterality: Bilateral;    Current Outpatient Rx  Name  Route  Sig  Dispense  Refill  . acetaminophen-codeine (TYLENOL #3) 300-30 MG per tablet   Oral   Take 1 tablet by mouth every 6 (six) hours as needed for moderate pain.   30 tablet   0   .  amitriptyline (ELAVIL) 25 MG tablet   Oral   Take 25 mg by mouth at bedtime.          Marland Kitchen aspirin-acetaminophen-caffeine (EXCEDRIN MIGRAINE) 250-250-65 MG per tablet   Oral   Take 1 tablet by mouth every 6 (six) hours as needed for headache.         . ibuprofen (ADVIL,MOTRIN) 200 MG tablet   Oral   Take 2 tablets (400 mg total) by mouth every 6 (six) hours as needed for headache or mild pain.   30 tablet   0   . levonorgestrel-ethinyl estradiol (AUBRA) 0.1-20 MG-MCG tablet   Oral   Take 1 tablet by mouth daily.         . montelukast (SINGULAIR) 10 MG tablet      TAKE 1 TABLET BY MOUTH DAILY   30 tablet   2   . amoxicillin-clavulanate (AUGMENTIN) 875-125 MG tablet   Oral   Take 1 tablet by mouth every 12 (twelve) hours. Patient not taking: Reported on 05/14/2015   14 tablet   0   . cetirizine-pseudoephedrine (ZYRTEC-D) 5-120 MG tablet   Oral   Take 1 tablet by mouth daily. Patient not taking: Reported on 05/14/2015   15 tablet   0   . ipratropium (ATROVENT) 0.06 % nasal spray   Each Nare   Place  2 sprays into both nostrils 4 (four) times daily. Patient not taking: Reported on 05/14/2015   15 mL   1   . ondansetron (ZOFRAN) 4 MG tablet   Oral   Take 1 tablet (4 mg total) by mouth every 6 (six) hours. Patient not taking: Reported on 05/14/2015   12 tablet   0   . traMADol (ULTRAM) 50 MG tablet   Oral   Take 1 tablet (50 mg total) by mouth every 6 (six) hours as needed. Patient not taking: Reported on 05/14/2015   15 tablet   0     Allergies Macrobid and Sulfa antibiotics  Family History  Problem Relation Age of Onset  . Cancer Mother   . Hypertension Mother   . Hypertension Father   . Liver disease Father   . Cancer Brother   . Hypertension Other     Social History Social History  Substance Use Topics  . Smoking status: Former Smoker -- 0.25 packs/day  . Smokeless tobacco: Never Used  . Alcohol Use: No    Review of  Systems Constitutional: No fever/chills Eyes: No visual changes. ENT: No sore throat. No stiff neck no neck pain Cardiovascular: Denies chest pain. Respiratory: Denies shortness of breath. Gastrointestinal:   no vomiting.  No diarrhea.  No constipation. Genitourinary: Negative for dysuria. Musculoskeletal: Negative lower extremity swelling Skin: Negative for rash. Neurological: Negative for headaches, focal weakness or numbness. 10-point ROS otherwise negative.  ____________________________________________   PHYSICAL EXAM:  VITAL SIGNS: ED Triage Vitals  Enc Vitals Group     BP 05/14/15 0646 139/99 mmHg     Pulse Rate 05/14/15 0646 80     Resp 05/14/15 0646 18     Temp 05/14/15 0646 98 F (36.7 C)     Temp Source 05/14/15 0646 Oral     SpO2 05/14/15 0646 100 %     Weight 05/14/15 0646 195 lb (88.451 kg)     Height 05/14/15 0646 5\' 2"  (1.575 m)     Head Cir --      Peak Flow --      Pain Score 05/14/15 0645 8     Pain Loc --      Pain Edu? --      Excl. in [place]? --     Constitutional: Alert and oriented. Well appearing and in no acute distress. Eyes: Conjunctivae are normal. PERRL. EOMI. Head: Atraumatic. Nose: No congestion/rhinnorhea. Mouth/Throat: Mucous membranes are moist.  Oropharynx non-erythematous. Neck: No stridor.   Nontender with no meningismus Cardiovascular: Normal rate, regular rhythm. Grossly normal heart sounds.  Good peripheral circulation. Respiratory: Normal respiratory effort.  No retractions. Lungs CTAB. Abdominal: Soft and nontender. No distention. No guarding no rebound Back:  There is tenderness to palpation in the paraspinal muscles bilaterally, midline tenderness there are no lesions noted. there is no CVA tenderness Musculoskeletal: No lower extremity tenderness. No joint effusions, no DVT signs strong distal pulses no edema Neurologic:  Normal speech and language. No gross focal neurologic deficits are appreciated. Reflexes are normal  positive straight leg raise on the right, symmetric distal pulses, no saddle anesthesia, nurse present for exam Skin:  Skin is warm, dry and intact. No rash noted. Psychiatric: Mood and affect are normal. Speech and behavior are normal.  ____________________________________________   LABS (all labs ordered are listed, but only abnormal results are displayed)  Labs Reviewed  URINALYSIS COMPLETEWITH MICROSCOPIC ([place] ONLY) - Abnormal; Notable for the following:    Color, Urine YELLOW (*)  APPearance CLEAR (*)    Squamous Epithelial / LPF 0-5 (*)    All other components within normal limits  COMPREHENSIVE METABOLIC PANEL - Abnormal; Notable for the following:    Potassium 3.3 (*)    All other components within normal limits  LIPASE, BLOOD  CBC WITH DIFFERENTIAL/PLATELET  POC URINE PREG, ED  POCT PREGNANCY, URINE   ____________________________________________  EKG  I personally interpreted any EKGs ordered by me or triage  ____________________________________________  RADIOLOGY  I reviewed any imaging ordered by me or triage that were performed during my shift ____________________________________________   PROCEDURES  Procedure(s) performed: None  Critical Care performed: None  ____________________________________________   INITIAL IMPRESSION / ASSESSMENT AND PLAN / ED COURSE  Pertinent labs & imaging results that were available during my care of the patient were reviewed by me and considered in my medical decision making (see chart for details).  Patient with very reproducible pain in the back with a known injury, with a history of baseline back pain, who has no evidence at this time kidney stone. Because she stated that she had hematuria as an outpatient, we did do a CT scan which shows no evidence of kidney stone likely to be causing her problems. The patient has no complaints at this time aside from a residual discomfort which is relieved somewhat by Toradol.  We'll send her home with pain medication and close follow-up with outpatient back doctors as a precaution. Patient has extensive return precautions, nothing to suggest cauda equina syndrome or other referred intra-abdominal pathology return precautions and follow-up have been understood however. Patient instructed not to drive on sedating medications ____________________________________________   FINAL CLINICAL IMPRESSION(S) / ED DIAGNOSES  Final diagnoses:  Flank pain     Schuyler Amor, MD 05/14/15 [place], MD 05/14/15 [phone]

## 2015-05-16 ENCOUNTER — Encounter (HOSPITAL_COMMUNITY): Payer: Self-pay | Admitting: Emergency Medicine

## 2015-05-16 ENCOUNTER — Emergency Department (INDEPENDENT_AMBULATORY_CARE_PROVIDER_SITE_OTHER)
Admission: EM | Admit: 2015-05-16 | Discharge: 2015-05-16 | Disposition: A | Discharge status: Home / Self Care | Payer: Self-pay | Source: Home / Self Care | Attending: Emergency Medicine | Admitting: Emergency Medicine

## 2015-05-16 DIAGNOSIS — J018 Other acute sinusitis: Secondary | ICD-10-CM

## 2015-05-16 DIAGNOSIS — M5431 Sciatica, right side: Secondary | ICD-10-CM

## 2015-05-16 LAB — POCT RAPID STREP A: STREPTOCOCCUS, GROUP A SCREEN (DIRECT): NEGATIVE

## 2015-05-16 MED ORDER — METHYLPREDNISOLONE ACETATE 80 MG/ML IJ SUSP
INTRAMUSCULAR | Status: AC
  Filled 2015-05-16: qty 1

## 2015-05-16 MED ORDER — IBUPROFEN 600 MG PO TABS: 600.0000 mg | ORAL_TABLET | Freq: Four times a day (QID) | ORAL | Status: DC | PRN

## 2015-05-16 MED ORDER — METHYLPREDNISOLONE ACETATE 80 MG/ML IJ SUSP
80.0000 mg | Freq: Once | INTRAMUSCULAR | Status: AC
  Administered 2015-05-16: 80 mg via INTRAMUSCULAR

## 2015-05-16 MED ORDER — PREDNISONE 50 MG PO TABS: ORAL_TABLET | ORAL | Status: DC

## 2015-05-16 NOTE — Discharge Instructions (Signed)
Your sciatic nerve is getting pinched in your lower back. Take prednisone daily for 5 days, starting tomorrow. Take ibuprofen 600 mg every 6 hours for the next 5 days, then as needed. Put a tennis ball in the freezer and do ice massage of the right buttock.  Your strep test is negative. You are developing a rhinosinusitis infection. The prednisone will help with the symptoms. Please continue the Singulair. Flonase may also benefit you.  Follow-up as needed.

## 2015-05-16 NOTE — ED Notes (Signed)
The patient presented to the [facility] with a complaint of lower back pain and a sore throat. The patient stated that she was diagnosed with sciatica in the ED and prescribed a muscle relaxer but it is not helping when she sleeps.

## 2015-05-16 NOTE — ED Provider Notes (Signed)
CSN: [id number]     Arrival date & time 05/16/15  1558 History   First MD Initiated Contact with Patient 05/16/15 1625     Chief Complaint  Patient presents with  . Back Pain  . Sore Throat   (Consider location/radiation/quality/duration/timing/severity/associated sxs/prior Treatment) HPI  She is a 34 year old woman here for follow-up of back pain and a new sore throat. She was seen here 3 days ago for right lower back pain.  At that time, she was concerned for a kidney stone so was referred to the emergency room for a CT scan. CT scan was negative for kidney stone and she was diagnosed with right-sided sciatica. She was given a prescription for Flexeril, which she states is not helping. She reports pain in the right lower back and buttock. She will get some sharp pains into the right hip and thigh. No numbness, stinging, weakness. No bowel or bladder incontinence. No saddle anesthesia. No injury or trauma, but she does work standing on her feet for 8 hours.  Yesterday, she developed a sore throat. It is associated with nasal congestion and rhinorrhea. She also reports feeling like there is fluid in her ears. She has a mild cough. No fevers or chills.  Past Medical History  Diagnosis Date  . Headache(784.0)     sinus  . GERD (gastroesophageal reflux disease)   . Kidney stones     no current problem  . Anxiety   . Deviated septum 09/2011  . 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  . Complication of anesthesia     states small mouth  . Pelvic inflammatory disease    Past Surgical History  Procedure Laterality Date  . Polyps removed      colon  . Wisdom tooth extraction    . Nasal septoplasty w/ turbinoplasty  10/04/2011    Procedure: NASAL SEPTOPLASTY WITH TURBINATE REDUCTION;  Surgeon: Ascencion Dike, MD;  Location: [place];  Service: ENT;  Laterality: Bilateral;   Family History  Problem Relation Age of Onset  . Cancer Mother   . Hypertension Mother   .  Hypertension Father   . Liver disease Father   . Cancer Brother   . Hypertension Other    Social History  Substance Use Topics  . Smoking status: Former Smoker -- 0.25 packs/day  . Smokeless tobacco: Never Used  . Alcohol Use: No   OB History    Gravida Para Term Preterm AB TAB SAB Ectopic Multiple Living   4 4 4       4      Review of Systems As in history of present illness Allergies  Macrobid and Sulfa antibiotics  Home Medications   Prior to Admission medications   Medication Sig Start Date End Date Taking? Authorizing Provider  acetaminophen-codeine (TYLENOL #3) 300-30 MG per tablet Take 1 tablet by mouth every 6 (six) hours as needed for moderate pain. 01/15/15   Lance Bosch, NP  amitriptyline (ELAVIL) 25 MG tablet Take 25 mg by mouth at bedtime.     Historical Provider, MD  aspirin-acetaminophen-caffeine (EXCEDRIN MIGRAINE) [id number] MG per tablet Take 1 tablet by mouth every 6 (six) hours as needed for headache.    Historical Provider, MD  cyclobenzaprine (FLEXERIL) 5 MG tablet Take 1 tablet (5 mg total) by mouth every 8 (eight) hours as needed for muscle spasms. 05/14/15 05/13/16  Schuyler Amor, MD  ibuprofen (ADVIL,MOTRIN) 600 MG tablet Take 1 tablet (600 mg total) by mouth  every 6 (six) hours as needed for moderate pain. 05/16/15   Melony Overly, MD  levonorgestrel-ethinyl estradiol (AUBRA) 0.1-20 MG-MCG tablet Take 1 tablet by mouth daily.    Historical Provider, MD  montelukast (SINGULAIR) 10 MG tablet TAKE 1 TABLET BY MOUTH DAILY 05/06/15   Lance Bosch, NP  predniSONE (DELTASONE) 50 MG tablet Take 1 pill daily for 5 days. 05/16/15   Melony Overly, MD   Meds Ordered and Administered this Visit   Medications  methylPREDNISolone acetate (DEPO-MEDROL) injection 80 mg (not administered)    BP 135/71 mmHg  Pulse 90  Temp(Src) 98.1 F (36.7 C) (Oral)  Resp 20  SpO2 98% No data found.   Physical Exam  Constitutional: She is oriented to person, place, and  time. She appears well-developed and well-nourished. No distress.  HENT:  Mouth/Throat: No oropharyngeal exudate.  Mild pharyngeal erythema. TMs normal bilaterally.  Eyes: Conjunctivae are normal.  Neck: Neck supple.  Cardiovascular: Normal rate, regular rhythm and normal heart sounds.   No murmur heard. Pulmonary/Chest: Effort normal and breath sounds normal. No respiratory distress. She has no wheezes. She has no rales.  Musculoskeletal:  Back: No erythema or edema.  No vertebral tenderness or step-offs. She has mildly tender to palpation in the SI region as well as over the form is. No greater trochanter tenderness. 5 out of 5 strength in bilateral lower extremities. Positive seated straight leg raise on the right.  Lymphadenopathy:    She has no cervical adenopathy.  Neurological: She is alert and oriented to person, place, and time.    ED Course  Procedures (including critical care time)  Labs Review Labs Reviewed  POCT RAPID STREP A    Imaging Review No results found.    MDM   1. Sciatica of right side   2. Other acute sinusitis    Depo-Medrol IM given here. Will discharge home with prednisone and ibuprofen for the back pain. Discussed that she is developing a cold or sinus infection. The prednisone will help with these symptoms as well. Follow-up as needed.    Melony Overly, MD 05/16/15 [phone]

## 2015-05-18 LAB — CULTURE, GROUP A STREP: Strep A Culture: NEGATIVE

## 2015-05-22 NOTE — ED Notes (Signed)
Final report of strep negative for pathogens

## 2015-05-23 ENCOUNTER — Emergency Department (HOSPITAL_BASED_OUTPATIENT_CLINIC_OR_DEPARTMENT_OTHER)
Admission: EM | Admit: 2015-05-23 | Discharge: 2015-05-24 | Disposition: A | Discharge status: Home / Self Care | Payer: Self-pay | Attending: Emergency Medicine | Admitting: Emergency Medicine

## 2015-05-23 ENCOUNTER — Emergency Department (HOSPITAL_BASED_OUTPATIENT_CLINIC_OR_DEPARTMENT_OTHER): Payer: Self-pay

## 2015-05-23 ENCOUNTER — Encounter (HOSPITAL_BASED_OUTPATIENT_CLINIC_OR_DEPARTMENT_OTHER): Payer: Self-pay | Admitting: Adult Health

## 2015-05-23 DIAGNOSIS — Z793 Long term (current) use of hormonal contraceptives: Secondary | ICD-10-CM | POA: Insufficient documentation

## 2015-05-23 DIAGNOSIS — Z9889 Other specified postprocedural states: Secondary | ICD-10-CM | POA: Insufficient documentation

## 2015-05-23 DIAGNOSIS — Z79899 Other long term (current) drug therapy: Secondary | ICD-10-CM | POA: Insufficient documentation

## 2015-05-23 DIAGNOSIS — N12 Tubulo-interstitial nephritis, not specified as acute or chronic: Secondary | ICD-10-CM | POA: Insufficient documentation

## 2015-05-23 DIAGNOSIS — Z87442 Personal history of urinary calculi: Secondary | ICD-10-CM | POA: Insufficient documentation

## 2015-05-23 DIAGNOSIS — F419 Anxiety disorder, unspecified: Secondary | ICD-10-CM | POA: Insufficient documentation

## 2015-05-23 DIAGNOSIS — Z3202 Encounter for pregnancy test, result negative: Secondary | ICD-10-CM | POA: Insufficient documentation

## 2015-05-23 DIAGNOSIS — Z8742 Personal history of other diseases of the female genital tract: Secondary | ICD-10-CM | POA: Insufficient documentation

## 2015-05-23 DIAGNOSIS — Z8709 Personal history of other diseases of the respiratory system: Secondary | ICD-10-CM | POA: Insufficient documentation

## 2015-05-23 DIAGNOSIS — Z87891 Personal history of nicotine dependence: Secondary | ICD-10-CM | POA: Insufficient documentation

## 2015-05-23 DIAGNOSIS — Z86010 Personal history of colonic polyps: Secondary | ICD-10-CM | POA: Insufficient documentation

## 2015-05-23 LAB — URINALYSIS, ROUTINE W REFLEX MICROSCOPIC
BILIRUBIN URINE: NEGATIVE
GLUCOSE, UA: NEGATIVE mg/dL
Ketones, ur: 15 mg/dL — AB
NITRITE: NEGATIVE
PH: 6 (ref 5.0–8.0)
Protein, ur: NEGATIVE mg/dL
SPECIFIC GRAVITY, URINE: 1.03 (ref 1.005–1.030)

## 2015-05-23 LAB — CBC WITH DIFFERENTIAL/PLATELET
BASOS ABS: 0 10*3/uL (ref 0.0–0.1)
BASOS PCT: 0 %
Eosinophils Absolute: 0.6 10*3/uL (ref 0.0–0.7)
Eosinophils Relative: 5 %
HEMATOCRIT: 41.4 % (ref 36.0–46.0)
Hemoglobin: 13.8 g/dL (ref 12.0–15.0)
Lymphocytes Relative: 24 %
Lymphs Abs: 2.5 10*3/uL (ref 0.7–4.0)
MCH: 29.4 pg (ref 26.0–34.0)
MCHC: 33.3 g/dL (ref 30.0–36.0)
MCV: 88.1 fL (ref 78.0–100.0)
MONO ABS: 1 10*3/uL (ref 0.1–1.0)
Monocytes Relative: 10 %
NEUTROS ABS: 6.5 10*3/uL (ref 1.7–7.7)
Neutrophils Relative %: 61 %
PLATELETS: 330 10*3/uL (ref 150–400)
RBC: 4.7 MIL/uL (ref 3.87–5.11)
RDW: 12.4 % (ref 11.5–15.5)
WBC: 10.6 10*3/uL — AB (ref 4.0–10.5)

## 2015-05-23 LAB — BASIC METABOLIC PANEL
ANION GAP: 9 (ref 5–15)
BUN: 11 mg/dL (ref 6–20)
CALCIUM: 8.8 mg/dL — AB (ref 8.9–10.3)
CO2: 27 mmol/L (ref 22–32)
Chloride: 101 mmol/L (ref 101–111)
Creatinine, Ser: 0.83 mg/dL (ref 0.44–1.00)
Glucose, Bld: 117 mg/dL — ABNORMAL HIGH (ref 65–99)
Potassium: 3.2 mmol/L — ABNORMAL LOW (ref 3.5–5.1)
Sodium: 137 mmol/L (ref 135–145)

## 2015-05-23 LAB — URINE MICROSCOPIC-ADD ON

## 2015-05-23 LAB — PREGNANCY, URINE: Preg Test, Ur: NEGATIVE

## 2015-05-23 MED ORDER — ONDANSETRON 4 MG PO TBDP: ORAL_TABLET | ORAL | Status: DC

## 2015-05-23 MED ORDER — LIDOCAINE HCL (PF) 1 % IJ SOLN
INTRAMUSCULAR | Status: AC
  Administered 2015-05-23: 2.1 mL
  Filled 2015-05-23: qty 5

## 2015-05-23 MED ORDER — CEFTRIAXONE SODIUM 1 G IJ SOLR
1.0000 g | Freq: Once | INTRAMUSCULAR | Status: AC
  Administered 2015-05-23: 1 g via INTRAMUSCULAR
  Filled 2015-05-23: qty 10

## 2015-05-23 MED ORDER — OXYCODONE-ACETAMINOPHEN 5-325 MG PO TABS
1.0000 | ORAL_TABLET | Freq: Once | ORAL | Status: AC
  Administered 2015-05-23: 1 via ORAL
  Filled 2015-05-23: qty 1

## 2015-05-23 MED ORDER — CEPHALEXIN 500 MG PO CAPS: 500.0000 mg | ORAL_CAPSULE | Freq: Four times a day (QID) | ORAL | Status: DC

## 2015-05-23 MED ORDER — OXYCODONE-ACETAMINOPHEN 5-325 MG PO TABS: 1.0000 | ORAL_TABLET | ORAL | Status: DC | PRN

## 2015-05-23 NOTE — Discharge Instructions (Signed)

## 2015-05-23 NOTE — ED Provider Notes (Signed)
CSN: [id number]     Arrival date & time 05/23/15  2050 History  By signing my name below, I, Helane Gunther, attest that this documentation has been prepared under the direction and in the presence of Debby Freiberg, MD. Electronically Signed: Helane Gunther, ED Scribe. 05/23/2015. 9:59 PM.    Chief Complaint  Patient presents with  . Abdominal Pain   Patient is a 34 y.o. female presenting with abdominal pain. The history is provided by the patient. No language interpreter was used.  Abdominal Pain Pain location:  RLQ Pain quality: aching   Pain radiates to:  Groin Pain severity:  Moderate Onset quality:  Gradual Duration:  3 days Timing:  Constant Progression:  Worsening Chronicity:  New Associated symptoms: no dysuria, no fever, no hematuria, no nausea and no vomiting    HPI Comments: Christina Fox is a 34 y.o. female who presents to the Emergency Department complaining of RLQ abdominal pain radiating into the right groin onset 3 days ago. She reports associated constant, aching, worsening back pain,numbness and tingling to the right hip, radiating around to the right groin. Pt states she was seen for the same in the ED at [facility] on 11/17, when she was diagnosed with sciatica. She states she also underwent diagnostic imaging to check for kidney stones. She notes she was given a shot and a course of steroids, which she finished 3 days ago. She states that since then the pain has been gradually worsening once more. She notes a PMHx of colon polyps. She notes she still has her appendix. She notes she is not currently on her period. Pt denies n/v, fever, falls, dysuria, hematuria, difficulty urinating, and loss of bowel or bladder control.   Past Medical History  Diagnosis Date  . Headache(784.0)     sinus  . GERD (gastroesophageal reflux disease)   . Kidney stones     no current problem  . Anxiety   . Deviated septum 09/2011  . Nasal turbinate hypertrophy 09/2011     bilat.  . Complication of anesthesia     states small mouth  . Pelvic inflammatory disease    Past Surgical History  Procedure Laterality Date  . Polyps removed      colon  . Wisdom tooth extraction    . Nasal septoplasty w/ turbinoplasty  10/04/2011    Procedure: NASAL SEPTOPLASTY WITH TURBINATE REDUCTION;  Surgeon: Ascencion Dike, MD;  Location: [place];  Service: ENT;  Laterality: Bilateral;   Family History  Problem Relation Age of Onset  . Cancer Mother   . Hypertension Mother   . Hypertension Father   . Liver disease Father   . Cancer Brother   . Hypertension Other    Social History  Substance Use Topics  . Smoking status: Former Smoker -- 0.25 packs/day  . Smokeless tobacco: Never Used  . Alcohol Use: No   OB History    Gravida Para Term Preterm AB TAB SAB Ectopic Multiple Living   4 4 4       4      Review of Systems  Constitutional: Negative for fever.  Gastrointestinal: Positive for abdominal pain. Negative for nausea and vomiting.  Genitourinary: Negative for dysuria, hematuria and difficulty urinating.  Musculoskeletal: Positive for back pain.  Neurological: Positive for numbness.  All other systems reviewed and are negative.   Allergies  Macrobid and Sulfa antibiotics  Home Medications   Prior to Admission medications   Medication Sig Start Date  End Date Taking? Authorizing Provider  acetaminophen-codeine (TYLENOL #3) 300-30 MG per tablet Take 1 tablet by mouth every 6 (six) hours as needed for moderate pain. 01/15/15   Lance Bosch, NP  amitriptyline (ELAVIL) 25 MG tablet Take 25 mg by mouth at bedtime.     Historical Provider, MD  aspirin-acetaminophen-caffeine (EXCEDRIN MIGRAINE) [phone] MG per tablet Take 1 tablet by mouth every 6 (six) hours as needed for headache.    Historical Provider, MD  cephALEXin (KEFLEX) 500 MG capsule Take 1 capsule (500 mg total) by mouth 4 (four) times daily. 05/23/15   Debby Freiberg, MD   cyclobenzaprine (FLEXERIL) 5 MG tablet Take 1 tablet (5 mg total) by mouth every 8 (eight) hours as needed for muscle spasms. 05/14/15 05/13/16  Schuyler Amor, MD  ibuprofen (ADVIL,MOTRIN) 600 MG tablet Take 1 tablet (600 mg total) by mouth every 6 (six) hours as needed for moderate pain. 05/16/15   Melony Overly, MD  levonorgestrel-ethinyl estradiol (AUBRA) 0.1-20 MG-MCG tablet Take 1 tablet by mouth daily.    Historical Provider, MD  montelukast (SINGULAIR) 10 MG tablet TAKE 1 TABLET BY MOUTH DAILY 05/06/15   Lance Bosch, NP  ondansetron (ZOFRAN ODT) 4 MG disintegrating tablet 4mg  ODT q4 hours prn nausea/vomit 05/23/15   Debby Freiberg, MD  oxyCODONE-acetaminophen (PERCOCET/ROXICET) 5-325 MG tablet Take 1-2 tablets by mouth every 4 (four) hours as needed. 05/23/15   Debby Freiberg, MD  predniSONE (DELTASONE) 50 MG tablet Take 1 pill daily for 5 days. 05/16/15   Melony Overly, MD   BP 136/81 mmHg  Pulse 94  Temp(Src) 98 F (36.7 C) (Oral)  Resp 18  Ht 5\' 2"  (1.575 m)  Wt 198 lb (89.812 kg)  BMI 36.21 kg/m2  SpO2 97% Physical Exam  Constitutional: She is oriented to person, place, and time. She appears well-developed and well-nourished.  HENT:  Head: Normocephalic and atraumatic.  Right Ear: External ear normal.  Left Ear: External ear normal.  Eyes: Conjunctivae and EOM are normal. Pupils are equal, round, and reactive to light.  Neck: Normal range of motion. Neck supple.  Cardiovascular: Normal rate, regular rhythm, normal heart sounds and intact distal pulses.   Pulmonary/Chest: Effort normal and breath sounds normal.  Abdominal: Soft. Bowel sounds are normal. There is no tenderness. There is CVA tenderness (R).  Musculoskeletal: Normal range of motion.  Neurological: She is alert and oriented to person, place, and time.  Skin: Skin is warm and dry.  Vitals reviewed.   ED Course  Procedures  DIAGNOSTIC STUDIES: Oxygen Saturation is 99% on RA, normal by my interpretation.     COORDINATION OF CARE: 9:57 PM - Discussed plans to order pain medication and a [place]. Pt advised of plan for treatment and pt agrees.  Labs Review Labs Reviewed  URINALYSIS, ROUTINE W REFLEX MICROSCOPIC (NOT AT [facility]) - Abnormal; Notable for the following:    APPearance CLOUDY (*)    Hgb urine dipstick MODERATE (*)    Ketones, ur 15 (*)    Leukocytes, UA LARGE (*)    All other components within normal limits  URINE MICROSCOPIC-ADD ON - Abnormal; Notable for the following:    Squamous Epithelial / LPF 6-30 (*)    Bacteria, UA MANY (*)    All other components within normal limits  CBC WITH DIFFERENTIAL/PLATELET - Abnormal; Notable for the following:    WBC 10.6 (*)    All other components within normal limits  BASIC METABOLIC PANEL - Abnormal; Notable  for the following:    Potassium 3.2 (*)    Glucose, Bld 117 (*)    Calcium 8.8 (*)    All other components within normal limits  PREGNANCY, URINE    Imaging Review US Renal  05/23/2015  CLINICAL DATA:  Right flank pain radiating to the right groin for 2 weeks. History of kidney stones, frequent urination. EXAM: RENAL / URINARY TRACT ULTRASOUND COMPLETE COMPARISON:  CT abdomen and pelvis 05/14/2015 FINDINGS: Right Kidney: Length: 10.7 cm. Normal parenchymal echotexture and thickness. No hydronephrosis. Suggestion of small echogenic structure centrally in the right kidney probably corresponding to stones seen on previous CT. Left Kidney: Length: 11.6 cm. Echogenicity within normal limits. No mass or hydronephrosis visualized. Bladder: Bladder is decompressed. Mild bladder wall thickening which could be due to under distention or cystitis. Bilateral urine flow jets are demonstrated with color flow Doppler imaging. No intraluminal filling defect. IMPRESSION: Tiny echogenic focus demonstrated in the midportion of the right kidney probably corresponding to known intrarenal stones. No evidence of hydronephrosis in either kidney. Bladder wall is  thickened, possibly due to under distention or infection. Electronically Signed   By: Lucienne Capers M.D.   On: 05/23/2015 22:58   I have personally reviewed and evaluated these images and lab results as part of my medical decision-making.   EKG Interpretation None      MDM   Final diagnoses:  Pyelonephritis    34 y.o. female with pertinent PMH of multiple visits recently for back pain presents with R flank pain radiating to abd.  CT scan 10 days ago unremarkable for appendicitis, but with intrarenal stone.  No fevers, vomiting at home.  Physical exam on arrival as above.  US unremarkable.  UA and clinical picture consistent with early pyelo.  Pt well appearing.  Rocephin given in ED and will have her dc with keflex, zofran, and percocet.  DC home in stable condition.    I have reviewed all laboratory and imaging studies if ordered as above  1. Pyelonephritis           Debby Freiberg, MD 05/23/15 [phone]

## 2015-05-23 NOTE — ED Notes (Addendum)
Presents with right lower flank pain with radiation into the right lower abdomen and groin. Reports recent dx of sciatica, pain has gotten worse since last Tuesday and she has finished all the medicatons given to her. Endorses frequent urination. denies vaginal discharge. Today pain became so severe she was unable to move from the couch. Had a cT scan last Thursday at [place], and went to [facility] last Saturday and was given a shot in bottom that did help for a few days but did not last long. Denies numbness and tingling down leg.  Pain is worse with standing.

## 2015-06-23 ENCOUNTER — Emergency Department (INDEPENDENT_AMBULATORY_CARE_PROVIDER_SITE_OTHER)
Admission: EM | Admit: 2015-06-23 | Discharge: 2015-06-23 | Disposition: A | Discharge status: Home / Self Care | Payer: No Typology Code available for payment source | Source: Home / Self Care | Attending: Emergency Medicine | Admitting: Emergency Medicine

## 2015-06-23 ENCOUNTER — Encounter (HOSPITAL_COMMUNITY): Payer: Self-pay | Admitting: Emergency Medicine

## 2015-06-23 DIAGNOSIS — J069 Acute upper respiratory infection, unspecified: Secondary | ICD-10-CM

## 2015-06-23 DIAGNOSIS — J0140 Acute pansinusitis, unspecified: Secondary | ICD-10-CM

## 2015-06-23 LAB — POCT URINALYSIS DIP (DEVICE)
Bilirubin Urine: NEGATIVE
GLUCOSE, UA: NEGATIVE mg/dL
KETONES UR: NEGATIVE mg/dL
Leukocytes, UA: NEGATIVE
Nitrite: NEGATIVE
PROTEIN: 30 mg/dL — AB
SPECIFIC GRAVITY, URINE: 1.025 (ref 1.005–1.030)
UROBILINOGEN UA: 0.2 mg/dL (ref 0.0–1.0)
pH: 6.5 (ref 5.0–8.0)

## 2015-06-23 MED ORDER — AMOXICILLIN-POT CLAVULANATE 875-125 MG PO TABS: 1.0000 | ORAL_TABLET | Freq: Two times a day (BID) | ORAL | Status: DC

## 2015-06-23 MED ORDER — PSEUDOEPHEDRINE-GUAIFENESIN ER 120-1200 MG PO TB12: 1.0000 | ORAL_TABLET | Freq: Two times a day (BID) | ORAL | Status: DC

## 2015-06-23 MED ORDER — IBUPROFEN 800 MG PO TABS: 800.0000 mg | ORAL_TABLET | Freq: Three times a day (TID) | ORAL | Status: DC | PRN

## 2015-06-23 NOTE — ED Notes (Signed)
Here with c/o URI's, nasal congestion/runny nose, with r ear pressure that started last Friday Weakness reported today with worsening cough at night  Taking Singulair and Flonase without relief C/o freq urination s/p bladder infection

## 2015-06-23 NOTE — ED Provider Notes (Signed)
HPI  SUBJECTIVE:  Christina Fox is a 34 y.o. female who presents with 5 days of occasional cough, rhinorrhea, nasal congestion, postnasal drip, sore throat in the morning, frontal sinus headache, bilateral ear pressure. She reports purulent yellow, odorous, nasal drainage. Reports some body aches and generalized weakness.  There are no aggravating or alleviating factors. She has been taking Flonase, Singulair, Ibuprofen 400 mg. Last dose more than 6 hours ago. She denies fevers, upper teeth hurting. No change in hearing. No wheezing, chest pain, shortness of breath. No abdominal pain. No allergy type symptoms, no sick contacts. She did not get a flu shot this year.  Past medical history of seasonal allergies, hypothyroidism, sinus surgery, turbinate reduction. If history of diabetes, asthma, hypertension. She does not smoke.   Patient did not mention any urinary complaints to me. Her primary concern was the URI/sinus symptoms. LMP: Amenorrheic she is on continuous [facility].  Past Medical History  Diagnosis Date  . Headache(784.0)     sinus  . GERD (gastroesophageal reflux disease)   . Kidney stones     no current problem  . Anxiety   . Deviated septum 09/2011  . 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  . Complication of anesthesia     states small mouth  . Pelvic inflammatory disease     Past Surgical History  Procedure Laterality Date  . Polyps removed      colon  . Wisdom tooth extraction    . Nasal septoplasty w/ turbinoplasty  10/04/2011    Procedure: NASAL SEPTOPLASTY WITH TURBINATE REDUCTION;  Surgeon: Ascencion Dike, MD;  Location: [place];  Service: ENT;  Laterality: Bilateral;    Family History  Problem Relation Age of Onset  . Cancer Mother   . Hypertension Mother   . Hypertension Father   . Liver disease Father   . Cancer Brother   . Hypertension Other     Social History  Substance Use Topics  .  Smoking status: Former Smoker -- 0.25 packs/day  . Smokeless tobacco: Never Used  . Alcohol Use: No    No current facility-administered medications for this encounter.  Current outpatient prescriptions:  .  acetaminophen-codeine (TYLENOL #3) 300-30 MG per tablet, Take 1 tablet by mouth every 6 (six) hours as needed for moderate pain., Disp: 30 tablet, Rfl: 0 .  amitriptyline (ELAVIL) 25 MG tablet, Take 25 mg by mouth at bedtime. , Disp: , Rfl:  .  amoxicillin-clavulanate (AUGMENTIN) 875-125 MG tablet, Take 1 tablet by mouth 2 (two) times daily. X 7 days, Disp: 14 tablet, Rfl: 0 .  aspirin-acetaminophen-caffeine (EXCEDRIN MIGRAINE) [id number] MG per tablet, Take 1 tablet by mouth every 6 (six) hours as needed for headache., Disp: , Rfl:  .  cyclobenzaprine (FLEXERIL) 5 MG tablet, Take 1 tablet (5 mg total) by mouth every 8 (eight) hours as needed for muscle spasms., Disp: 30 tablet, Rfl: 1 .  ibuprofen (ADVIL,MOTRIN) 800 MG tablet, Take 1 tablet (800 mg total) by mouth every 8 (eight) hours as needed., Disp: 30 tablet, Rfl: 0 .  levonorgestrel-ethinyl estradiol (AUBRA) 0.1-20 MG-MCG tablet, Take 1 tablet by mouth daily., Disp: , Rfl:  .  montelukast (SINGULAIR) 10 MG tablet, TAKE 1 TABLET BY MOUTH DAILY, Disp: 30 tablet, Rfl: 2 .  ondansetron (ZOFRAN ODT) 4 MG disintegrating tablet, 4mg  ODT q4 hours prn nausea/vomit, Disp: 15 tablet, Rfl: 0 .  Pseudoephedrine-Guaifenesin (MUCINEX D) [phone] MG TB12, Take 1  tablet by mouth 2 (two) times daily., Disp: 20 each, Rfl: 0 .  [DISCONTINUED] escitalopram (LEXAPRO) 10 MG tablet, Take 10 mg by mouth daily.  , Disp: , Rfl:  .  [DISCONTINUED] ipratropium (ATROVENT) 0.06 % nasal spray, Place 2 sprays into both nostrils 4 (four) times daily. (Patient not taking: Reported on 05/14/2015), Disp: 15 mL, Rfl: 1  Allergies  Allergen Reactions  . Macrobid [Nitrofurantoin Monohyd Macro] Rash  . Sulfa Antibiotics Hives and Rash     ROS  As noted in HPI.    Physical Exam  BP 118/80 mmHg  Pulse 81  Temp(Src) 97.6 F (36.4 C) (Oral)  SpO2 95%  Constitutional: Well developed, well nourished, no acute distress Eyes:  EOMI, conjunctiva normal bilaterally HENT: Normocephalic, atraumatic,mucus membranes moist. TMs normal bilaterally. Positive purulent nasal drainage. Positive maxillary and frontal sinus tenderness. Turbinates erythematous, not swollen. Normal dentition. Normal oropharynx. Uvula midline. Positive postnasal drip.  Lymph: No cervical lymphadenopathy  Respiratory: Normal inspiratory effort lungs clear bilaterally  Cardiovascular: Normal rate regular rhythm no murmurs rubs gallops  GI: nondistended skin: No rash, skin intact Musculoskeletal: no deformities Neurologic: Alert & oriented x 3, no focal neuro deficits Psychiatric: Speech and behavior appropriate   ED Course   Medications - No data to display  Orders Placed This Encounter  Procedures  . POCT urinalysis dip (device)    Standing Status: Standing     Number of Occurrences: 1     Standing Expiration Date:     Results for orders placed or performed during the hospital encounter of 06/23/15 (from the past 24 hour(s))  POCT urinalysis dip (device)     Status: Abnormal   Collection Time: 06/23/15  4:16 PM  Result Value Ref Range   Glucose, UA NEGATIVE NEGATIVE mg/dL   Bilirubin Urine NEGATIVE NEGATIVE   Ketones, ur NEGATIVE NEGATIVE mg/dL   Specific Gravity, Urine 1.025 1.005 - 1.030   Hgb urine dipstick TRACE (A) NEGATIVE   pH 6.5 5.0 - 8.0   Protein, ur 30 (A) NEGATIVE mg/dL   Urobilinogen, UA 0.2 0.0 - 1.0 mg/dL   Nitrite NEGATIVE NEGATIVE   Leukocytes, UA NEGATIVE NEGATIVE   No results found.  ED Clinical Impression  Acute pansinusitis, recurrence not specified  URI (upper respiratory infection)   ED Assessment/Plan   UA negative for UTI.   Doubt flu. No evidence of otitis, pharyngitis, pneumonia. Most likely viral, however, with purulent  nasal drainage, will send home with a wait-and-see prescription for antibiotics. Patient to continue Flonase, start saline nasal irrigation, Mucinex D, ibuprofen 800 mg. Wait-and-see prescription of Augmentin. Dr. note for 2 days. Follow-up with primary care physician as needed. Return here or follow-up with primary care physician if gets worse   Discussed  MDM, plan and followup with patient . Patient  agrees with plan.   *This clinic note was created using Dragon dictation software. Therefore, there may be occasional mistakes despite careful proofreading.  ?   Melynda Ripple, MD 06/23/15 1731

## 2015-06-23 NOTE — Discharge Instructions (Signed)
Take the medication as written. Follow up with your primary care physician as needed. Return if you get worse, have a fever >100.4, or for any concerns. You may take 800 mg of motrin with 1 gram of tylenol up to 3 times a day as needed for pain. This is an effective combination for pain.  Most sinus infections are viral and do not need antibiotics unless you have a high fever, have had this for 10 days, or you get better and then get sick again. Use a neti pot or the NeilMed sinus rinse as often as you want to to reduce nasal congestion. Follow the directions on the box.   Go to www.goodrx.com to look up your medications. This will give you a list of where you can find your prescriptions at the most affordable prices.

## 2015-07-07 MED FILL — ?MONTELUKAST SOD 10 MG TAB: 10 | 30 days supply | Qty: 30 | Fill #2

## 2015-07-08 ENCOUNTER — Ambulatory Visit: Payer: Self-pay | Attending: Family Medicine

## 2015-07-16 ENCOUNTER — Encounter: Payer: Self-pay | Admitting: Internal Medicine

## 2015-07-26 ENCOUNTER — Encounter (HOSPITAL_COMMUNITY): Payer: Self-pay | Admitting: Emergency Medicine

## 2015-07-26 ENCOUNTER — Other Ambulatory Visit (HOSPITAL_COMMUNITY)
Admission: RE | Admit: 2015-07-26 | Discharge: 2015-07-26 | Disposition: A | Discharge status: Home / Self Care | Payer: No Typology Code available for payment source | Source: Ambulatory Visit | Attending: Emergency Medicine | Admitting: Emergency Medicine

## 2015-07-26 ENCOUNTER — Emergency Department (INDEPENDENT_AMBULATORY_CARE_PROVIDER_SITE_OTHER)
Admission: EM | Admit: 2015-07-26 | Discharge: 2015-07-26 | Disposition: A | Discharge status: Home / Self Care | Payer: Self-pay | Source: Home / Self Care | Attending: Emergency Medicine | Admitting: Emergency Medicine

## 2015-07-26 DIAGNOSIS — N390 Urinary tract infection, site not specified: Secondary | ICD-10-CM | POA: Insufficient documentation

## 2015-07-26 LAB — POCT URINALYSIS DIP (DEVICE)
BILIRUBIN URINE: NEGATIVE
GLUCOSE, UA: NEGATIVE mg/dL
KETONES UR: NEGATIVE mg/dL
NITRITE: NEGATIVE
PH: 6 (ref 5.0–8.0)
Protein, ur: NEGATIVE mg/dL
Specific Gravity, Urine: >=1.03 (ref 1.005–1.030)
Urobilinogen, UA: 0.2 mg/dL (ref 0.0–1.0)

## 2015-07-26 LAB — POCT PREGNANCY, URINE: PREG TEST UR: NEGATIVE

## 2015-07-26 MED ORDER — CEPHALEXIN 500 MG PO CAPS
500.0000 mg | ORAL_CAPSULE | Freq: Four times a day (QID) | ORAL | Status: DC
Start: 2015-07-26 — End: 2015-10-13

## 2015-07-26 NOTE — ED Provider Notes (Signed)
CSN: [id number]     Arrival date & time 07/26/15  1712 History   First MD Initiated Contact with Patient 07/26/15 1723     Chief Complaint  Patient presents with  . Abdominal Pain  . Panic Attack   (Consider location/radiation/quality/duration/timing/severity/associated sxs/prior Treatment) HPI  She is a 35 year old woman here for evaluation of abdominal pain. She states she developed sharp right abdominal pain around 5 AM this morning. It woke her from sleep. No associated nausea or vomiting. No fevers. EMS was called and came to her house. They did an EKG which was normal.  She took some ibuprofen and the pain subsided. She did not go to the hospital as she did not have someone to watch her children. She continues to have some discomfort in the right lower quadrant, but not the sharp pains she was having earlier. No fevers. No nausea or vomiting. No diarrhea or constipation. Last bowel movement was yesterday and was normal. She denies any dysuria or urinary symptoms, but states she has recently had a kidney infection does also have some kidney stones. She reports a small amount of vaginal discharge, but states she was tested at her OB/GYN's office last week and was negative.  She also reports feeling quite anxious since this happened. She reports dry mouth, mild shortness of breath, tingling, and heartburn. She takes amitriptyline for anxiety.  Past Medical History  Diagnosis Date  . Headache(784.0)     sinus  . GERD (gastroesophageal reflux disease)   . Kidney stones     no current problem  . Anxiety   . Deviated septum 09/2011  . 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  . Complication of anesthesia     states small mouth  . Pelvic inflammatory disease    Past Surgical History  Procedure Laterality Date  . Polyps removed      colon  . Wisdom tooth extraction    . Nasal septoplasty w/ turbinoplasty  10/04/2011    Procedure: NASAL SEPTOPLASTY WITH TURBINATE REDUCTION;  Surgeon:  Ascencion Dike, MD;  Location: [place];  Service: ENT;  Laterality: Bilateral;   Family History  Problem Relation Age of Onset  . Cancer Mother   . Hypertension Mother   . Hypertension Father   . Liver disease Father   . Cancer Brother   . Hypertension Other    Social History  Substance Use Topics  . Smoking status: Former Smoker -- 0.25 packs/day  . Smokeless tobacco: Never Used  . Alcohol Use: No   OB History    Gravida Para Term Preterm AB TAB SAB Ectopic Multiple Living   4 4 4       4      Review of Systems As in history of present illness Allergies  Macrobid and Sulfa antibiotics  Home Medications   Prior to Admission medications   Medication Sig Start Date End Date Taking? Authorizing Provider  acetaminophen-codeine (TYLENOL #3) 300-30 MG per tablet Take 1 tablet by mouth every 6 (six) hours as needed for moderate pain. 01/15/15   Lance Bosch, NP  amitriptyline (ELAVIL) 25 MG tablet Take 25 mg by mouth at bedtime.     Historical Provider, MD  aspirin-acetaminophen-caffeine (EXCEDRIN MIGRAINE) [phone] MG per tablet Take 1 tablet by mouth every 6 (six) hours as needed for headache.    Historical Provider, MD  cephALEXin (KEFLEX) 500 MG capsule Take 1 capsule (500 mg total) by mouth 4 (four) times daily. 07/26/15  Melony Overly, MD  cyclobenzaprine (FLEXERIL) 5 MG tablet Take 1 tablet (5 mg total) by mouth every 8 (eight) hours as needed for muscle spasms. 05/14/15 05/13/16  Schuyler Amor, MD  ibuprofen (ADVIL,MOTRIN) 800 MG tablet Take 1 tablet (800 mg total) by mouth every 8 (eight) hours as needed. 06/23/15   Melynda Ripple, MD  levonorgestrel-ethinyl estradiol [facility]) 0.1-20 MG-MCG tablet Take 1 tablet by mouth daily.    Historical Provider, MD  montelukast (SINGULAIR) 10 MG tablet TAKE 1 TABLET BY MOUTH DAILY 05/06/15   Lance Bosch, NP  ondansetron (ZOFRAN ODT) 4 MG disintegrating tablet 4mg  ODT q4 hours prn nausea/vomit 05/23/15   Debby Freiberg, MD  Pseudoephedrine-Guaifenesin [facility] D) [phone] MG TB12 Take 1 tablet by mouth 2 (two) times daily. 06/23/15   Melynda Ripple, MD   Meds Ordered and Administered this Visit  Medications - No data to display  BP 124/86 mmHg  Pulse 106  Temp(Src) 97.4 F (36.3 C) (Oral)  SpO2 96% No data found.    Physical Exam  Constitutional: She is oriented to person, place, and time. She appears well-developed and well-nourished. No distress.  Neck: Neck supple.  Cardiovascular: Normal rate, regular rhythm and normal heart sounds.   No murmur heard. Pulmonary/Chest: Effort normal and breath sounds normal. No respiratory distress. She has no wheezes. She has no rales.  Abdominal: Soft. Bowel sounds are normal. She exhibits no distension. There is tenderness (suprapubic ). There is no rebound and no guarding.  No CVA tenderness  Neurological: She is alert and oriented to person, place, and time.    ED Course  Procedures (including critical care time)  Labs Review Labs Reviewed  POCT URINALYSIS DIP (DEVICE) - Abnormal; Notable for the following:    Hgb urine dipstick TRACE (*)    Leukocytes, UA SMALL (*)    All other components within normal limits  URINE CULTURE  POCT PREGNANCY, URINE    Imaging Review No results found.    MDM   1. UTI (lower urinary tract infection)    UA is concerning for UTI. We'll send for culture and start on Keflex. Kidney stone is also possibility. She states she has ibuprofen 800 mg at home and this works well for her. Return precautions reviewed.    Melony Overly, MD 07/26/15 [phone]

## 2015-07-26 NOTE — ED Notes (Addendum)
Pt comes in with c/o sudden right severe, sharp abdominal pain associated with nausea and panic attack Sx's started 4am today. EMS called to house , pt unable to transfer to hospital due to kids at home alone Instructed to come here. EGK done at home Pain now radiating to  Left side. Hx kidney stone Denies hematuria, chills, fever or vomiting  Slight vaginal d/c reported  HX PID

## 2015-07-26 NOTE — Discharge Instructions (Signed)
You had a urinary tract infection. You may also have a kidney stone. You can take ibuprofen as needed for pain. Take Keflex 4 times a day for the next week for infection. You can take your amitriptyline tonight as usual. You can get over-the-counter Prilosec or Zantac for heartburn. If your symptoms are worsening or just not improving, please come back.

## 2015-07-27 ENCOUNTER — Ambulatory Visit: Payer: Self-pay | Attending: Internal Medicine | Admitting: Internal Medicine

## 2015-07-27 ENCOUNTER — Encounter: Payer: Self-pay | Admitting: Internal Medicine

## 2015-07-27 VITALS — BP 125/86 | HR 78 | Temp 98.0°F | Resp 16 | Ht 62.0 in | Wt 198.0 lb

## 2015-07-27 DIAGNOSIS — K219 Gastro-esophageal reflux disease without esophagitis: Secondary | ICD-10-CM | POA: Insufficient documentation

## 2015-07-27 DIAGNOSIS — B373 Candidiasis of vulva and vagina: Secondary | ICD-10-CM | POA: Insufficient documentation

## 2015-07-27 DIAGNOSIS — Z79899 Other long term (current) drug therapy: Secondary | ICD-10-CM | POA: Insufficient documentation

## 2015-07-27 DIAGNOSIS — B3731 Acute candidiasis of vulva and vagina: Secondary | ICD-10-CM

## 2015-07-27 DIAGNOSIS — Z7982 Long term (current) use of aspirin: Secondary | ICD-10-CM | POA: Insufficient documentation

## 2015-07-27 DIAGNOSIS — E669 Obesity, unspecified: Secondary | ICD-10-CM | POA: Insufficient documentation

## 2015-07-27 DIAGNOSIS — Z0000 Encounter for general adult medical examination without abnormal findings: Secondary | ICD-10-CM

## 2015-07-27 DIAGNOSIS — Z87891 Personal history of nicotine dependence: Secondary | ICD-10-CM | POA: Insufficient documentation

## 2015-07-27 DIAGNOSIS — Z888 Allergy status to other drugs, medicaments and biological substances status: Secondary | ICD-10-CM | POA: Insufficient documentation

## 2015-07-27 DIAGNOSIS — Z87442 Personal history of urinary calculi: Secondary | ICD-10-CM | POA: Insufficient documentation

## 2015-07-27 DIAGNOSIS — Z0001 Encounter for general adult medical examination with abnormal findings: Secondary | ICD-10-CM | POA: Insufficient documentation

## 2015-07-27 MED ORDER — FLUCONAZOLE 150 MG PO TABS: 150.0000 mg | ORAL_TABLET | Freq: Once | ORAL | Status: DC

## 2015-07-27 NOTE — Progress Notes (Signed)
Patient ID: Christina Fox, female   DOB: [birth year], 35 y.o.   MRN: [id number]  CC: physical  HPI: [name] is a 35 y.o. female here today for a annual physical.  Patient has past medical history of GERD and kidney stones. Patient is on Singulair, OCP, and Elavil for anxiety. She is being treated by [facility] for anxiety. No new changes in medical history. Patient states that she was diagnosed with a UTI yesterday. She states that she had severe pain and is wondering if her kidney stones have moved. She has now one stone on each side. She is concerned about weight loss. She has no other complaints today.  Allergies  Allergen Reactions  . Macrobid [Nitrofurantoin Monohyd Macro] Rash  . Sulfa Antibiotics Hives and Rash   Past Medical History  Diagnosis Date  . Headache(784.0)     sinus  . GERD (gastroesophageal reflux disease)   . Kidney stones     no current problem  . Anxiety   . Deviated septum 09/2011  . 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  . Complication of anesthesia     states small mouth  . Pelvic inflammatory disease    Current Outpatient Prescriptions on File Prior to Visit  Medication Sig Dispense Refill  . acetaminophen-codeine (TYLENOL #3) 300-30 MG per tablet Take 1 tablet by mouth every 6 (six) hours as needed for moderate pain. 30 tablet 0  . amitriptyline (ELAVIL) 25 MG tablet Take 25 mg by mouth at bedtime.     . cephALEXin (KEFLEX) 500 MG capsule Take 1 capsule (500 mg total) by mouth 4 (four) times daily. 28 capsule 0  . cyclobenzaprine (FLEXERIL) 5 MG tablet Take 1 tablet (5 mg total) by mouth every 8 (eight) hours as needed for muscle spasms. 30 tablet 1  . ibuprofen (ADVIL,MOTRIN) 800 MG tablet Take 1 tablet (800 mg total) by mouth every 8 (eight) hours as needed. 30 tablet 0  . levonorgestrel-ethinyl estradiol (AUBRA) 0.1-20 MG-MCG tablet Take 1 tablet by mouth daily.    . montelukast (SINGULAIR) 10 MG tablet TAKE 1 TABLET BY MOUTH DAILY 30  tablet 2  . aspirin-acetaminophen-caffeine (EXCEDRIN MIGRAINE) [id number] MG per tablet Take 1 tablet by mouth every 6 (six) hours as needed for headache.    . ondansetron (ZOFRAN ODT) 4 MG disintegrating tablet 4mg  ODT q4 hours prn nausea/vomit 15 tablet 0  . Pseudoephedrine-Guaifenesin (MUCINEX D) [phone] MG TB12 Take 1 tablet by mouth 2 (two) times daily. 20 each 0  . [DISCONTINUED] escitalopram (LEXAPRO) 10 MG tablet Take 10 mg by mouth daily.      . [DISCONTINUED] ipratropium (ATROVENT) 0.06 % nasal spray Place 2 sprays into both nostrils 4 (four) times daily. (Patient not taking: Reported on 05/14/2015) 15 mL 1   No current facility-administered medications on file prior to visit.   Family History  Problem Relation Age of Onset  . Cancer Mother   . Hypertension Mother   . Hypertension Father   . Liver disease Father   . Cancer Brother   . Hypertension Other    Social History   Social History  . Marital Status: Single    Spouse Name: N/A  . Number of Children: N/A  . Years of Education: N/A   Occupational History  . Not on file.   Social History Main Topics  . Smoking status: Former Smoker -- 0.25 packs/day  . Smokeless tobacco: Never Used  . Alcohol Use: No  . Drug Use: No  .  Sexual Activity: Not Currently    Birth Control/ Protection: Pill   Other Topics Concern  . Not on file   Social History Narrative    Review of Systems: Constitutional: Negative for fever, chills, diaphoresis, activity change, appetite change and fatigue. HENT: Negative for ear pain, nosebleeds, congestion, facial swelling, rhinorrhea, neck pain, neck stiffness and ear discharge.  Eyes: Negative for pain, discharge, redness, itching and visual disturbance. Respiratory: Negative for cough, choking, chest tightness, shortness of breath, wheezing and stridor.  Cardiovascular: Negative for chest pain, palpitations and leg swelling. Gastrointestinal: Negative for abdominal  distention. Genitourinary: Negative for dysuria, urgency, frequency, hematuria, flank pain, decreased urine volume, difficulty urinating and dyspareunia.  Musculoskeletal: Negative for back pain, joint swelling, arthralgias and gait problem. Neurological: Negative for dizziness, tremors, seizures, syncope, facial asymmetry, speech difficulty, weakness, light-headedness, numbness and headaches.  Hematological: Negative for adenopathy. Does not bruise/bleed easily. Psychiatric/Behavioral: Negative for hallucinations, behavioral problems, confusion, dysphoric mood, decreased concentration and agitation.    Objective:   Filed Vitals:   07/27/15 1700  BP: 125/86  Pulse: 78  Temp: 98 F (36.7 C)  Resp: 16    Physical Exam: Constitutional: Patient appears well-developed and well-nourished. No distress. HENT: Normocephalic, atraumatic, External right and left ear normal. Oropharynx is clear and moist.  Eyes: Conjunctivae and EOM are normal. PERRLA, no scleral icterus. Neck: Normal ROM. Neck supple. No JVD. No tracheal deviation. No thyromegaly. CVS: RRR, S1/S2 +, no murmurs, no gallops, no carotid bruit.  Pulmonary: Effort and breath sounds normal, no stridor, rhonchi, wheezes, rales.  Abdominal: Soft. BS +,  no distension, tenderness, rebound or guarding.  Musculoskeletal: Normal range of motion. No edema and no tenderness.  Lymphadenopathy: No lymphadenopathy noted, cervical, inguinal or axillary Neuro: Alert. Normal reflexes, muscle tone coordination. No cranial nerve deficit. Skin: Skin is warm and dry. No rash noted. Not diaphoretic. No erythema. No pallor. Psychiatric: Normal mood and affect. Behavior, judgment, thought content normal.  Lab Results  Component Value Date   WBC 10.6* 05/23/2015   HGB 13.8 05/23/2015   HCT 41.4 05/23/2015   MCV 88.1 05/23/2015   PLT 330 05/23/2015   Lab Results  Component Value Date   CREATININE 0.83 05/23/2015   BUN 11 05/23/2015   NA 137  05/23/2015   K 3.2* 05/23/2015   CL 101 05/23/2015   CO2 27 05/23/2015    No results found for: HGBA1C Lipid Panel  No results found for: CHOL, TRIG, HDL, CHOLHDL, VLDL, LDLCALC     Assessment and plan:   Macyn was seen today for annual exam.  Diagnoses and all orders for this visit:  Annual physical exam Patient reports that she is up to date on pap and breast exam.   Vaginal candida -     fluconazole (DIFLUCAN) 150 MG tablet; Take 1 tablet (150 mg total) by mouth once. May repeat in one week if needed Patient on antibiotic therapy. Will send diflucan to prevent yeast  Obesity -     Amb Referral to Nutrition and Diabetic E Refer to nutrition. Explained that 60 minutes of exercise is recommended at least 4-5 times per week. Advised her to get a calorie counting app on phone to log phone and workout so that she can be more conscious of what she is eating. I would not recommend Adipex. Other weight loss agents such as Belviq or Victoza will be pricey considering she is uninsured.     Return if symptoms worsen or fail to improve.  Lance Bosch, [facility] [phone] 07/27/2015, 5:11 PM'

## 2015-07-27 NOTE — Progress Notes (Signed)
Patient here for her physical Patient stated she was seen yesterday at the [facility] for abd pain  And diagnosed with UTI Patient is currently on ABT

## 2015-07-28 LAB — URINE CULTURE: Culture: 60000

## 2015-08-06 MED FILL — ?MONTELUKAST SOD 10 MG TAB: 10 | 30 days supply | Qty: 30 | Fill #3

## 2015-08-24 ENCOUNTER — Emergency Department (HOSPITAL_BASED_OUTPATIENT_CLINIC_OR_DEPARTMENT_OTHER): Payer: Self-pay

## 2015-08-24 ENCOUNTER — Emergency Department (HOSPITAL_BASED_OUTPATIENT_CLINIC_OR_DEPARTMENT_OTHER)
Admission: EM | Admit: 2015-08-24 | Discharge: 2015-08-24 | Disposition: A | Discharge status: Home / Self Care | Payer: Self-pay | Attending: Emergency Medicine | Admitting: Emergency Medicine

## 2015-08-24 ENCOUNTER — Encounter (HOSPITAL_BASED_OUTPATIENT_CLINIC_OR_DEPARTMENT_OTHER): Payer: Self-pay | Admitting: *Deleted

## 2015-08-24 DIAGNOSIS — Z8659 Personal history of other mental and behavioral disorders: Secondary | ICD-10-CM | POA: Insufficient documentation

## 2015-08-24 DIAGNOSIS — Z3202 Encounter for pregnancy test, result negative: Secondary | ICD-10-CM | POA: Insufficient documentation

## 2015-08-24 DIAGNOSIS — Z87440 Personal history of urinary (tract) infections: Secondary | ICD-10-CM | POA: Insufficient documentation

## 2015-08-24 DIAGNOSIS — Z87442 Personal history of urinary calculi: Secondary | ICD-10-CM | POA: Insufficient documentation

## 2015-08-24 DIAGNOSIS — Z8719 Personal history of other diseases of the digestive system: Secondary | ICD-10-CM | POA: Insufficient documentation

## 2015-08-24 DIAGNOSIS — Z87891 Personal history of nicotine dependence: Secondary | ICD-10-CM | POA: Insufficient documentation

## 2015-08-24 DIAGNOSIS — Z79899 Other long term (current) drug therapy: Secondary | ICD-10-CM | POA: Insufficient documentation

## 2015-08-24 DIAGNOSIS — Z8742 Personal history of other diseases of the female genital tract: Secondary | ICD-10-CM | POA: Insufficient documentation

## 2015-08-24 DIAGNOSIS — R102 Pelvic and perineal pain: Secondary | ICD-10-CM | POA: Insufficient documentation

## 2015-08-24 DIAGNOSIS — Z792 Long term (current) use of antibiotics: Secondary | ICD-10-CM | POA: Insufficient documentation

## 2015-08-24 DIAGNOSIS — Z8709 Personal history of other diseases of the respiratory system: Secondary | ICD-10-CM | POA: Insufficient documentation

## 2015-08-24 DIAGNOSIS — R300 Dysuria: Secondary | ICD-10-CM | POA: Insufficient documentation

## 2015-08-24 LAB — URINALYSIS, ROUTINE W REFLEX MICROSCOPIC
BILIRUBIN URINE: NEGATIVE
Glucose, UA: NEGATIVE mg/dL
Ketones, ur: NEGATIVE mg/dL
Nitrite: NEGATIVE
PH: 6.5 (ref 5.0–8.0)
Protein, ur: NEGATIVE mg/dL
SPECIFIC GRAVITY, URINE: 1.018 (ref 1.005–1.030)

## 2015-08-24 LAB — URINE MICROSCOPIC-ADD ON

## 2015-08-24 LAB — WET PREP, GENITAL
CLUE CELLS WET PREP: NONE SEEN
Sperm: NONE SEEN
TRICH WET PREP: NONE SEEN

## 2015-08-24 LAB — PREGNANCY, URINE: Preg Test, Ur: NEGATIVE

## 2015-08-24 MED ORDER — CIPROFLOXACIN HCL 500 MG PO TABS: 500.0000 mg | ORAL_TABLET | Freq: Two times a day (BID) | ORAL | Status: DC

## 2015-08-24 NOTE — ED Provider Notes (Signed)
CSN: [id number]     Arrival date & time 08/24/15  1925 History  By signing my name below, I, [name], attest that this documentation has been prepared under the direction and in the presence of Veryl Speak, MD. Electronically Signed: [name], ED Scribe. 08/24/2015. 9:04 PM   Chief Complaint  Patient presents with  . Hematuria   The history is provided by the patient. No language interpreter was used.   Christina Fox is a 35 y.o. female with history of kidney stones and PID who presents to the Emergency Department complaining of new hematuria with onset 2 days ago.  Associated symptoms include constant lower mid abdominal pressure, lower back pain, increased frequency, and new vaginal discharge. Pt states that she was diagnosed with 2 kidney stones at least 3 months ago and she does not believe that they have passed. She was last on abx 1 month ago for a kidney infection.  Pt denies dysuria. She was last sexually active in December of 2016.  Pt states that she has not followed up with urology because her PCP will not refer her.   Past Medical History  Diagnosis Date  . Headache(784.0)     sinus  . GERD (gastroesophageal reflux disease)   . Kidney stones     no current problem  . Anxiety   . Deviated septum 09/2011  . 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  . Complication of anesthesia     states small mouth  . Pelvic inflammatory disease   . Urinary tract infection    Past Surgical History  Procedure Laterality Date  . Polyps removed      colon  . Wisdom tooth extraction    . Nasal septoplasty w/ turbinoplasty  10/04/2011    Procedure: NASAL SEPTOPLASTY WITH TURBINATE REDUCTION;  Surgeon: Ascencion Dike, MD;  Location: [place];  Service: ENT;  Laterality: Bilateral;   Family History  Problem Relation Age of Onset  . Cancer Mother   . Hypertension Mother   . Hypertension Father   . Liver disease Father   . Cancer Brother   . Hypertension  Other    Social History  Substance Use Topics  . Smoking status: Former Smoker -- 0.25 packs/day    Quit date: 08/23/2009  . Smokeless tobacco: Never Used  . Alcohol Use: No   OB History    Gravida Para Term Preterm AB TAB SAB Ectopic Multiple Living   4 4 4       4      Review of Systems  10 Systems reviewed and all are negative for acute change except as noted in the HPI.  Allergies  Macrobid and Sulfa antibiotics  Home Medications   Prior to Admission medications   Medication Sig Start Date End Date Taking? Authorizing Provider  acetaminophen-codeine (TYLENOL #3) 300-30 MG per tablet Take 1 tablet by mouth every 6 (six) hours as needed for moderate pain. 01/15/15   Lance Bosch, NP  amitriptyline (ELAVIL) 25 MG tablet Take 25 mg by mouth at bedtime.     Historical Provider, MD  cephALEXin (KEFLEX) 500 MG capsule Take 1 capsule (500 mg total) by mouth 4 (four) times daily. 07/26/15   Melony Overly, MD  cyclobenzaprine (FLEXERIL) 5 MG tablet Take 1 tablet (5 mg total) by mouth every 8 (eight) hours as needed for muscle spasms. 05/14/15 05/13/16  Schuyler Amor, MD  fluconazole (DIFLUCAN) 150 MG tablet Take 1 tablet (150  mg total) by mouth once. May repeat in one week if needed 07/27/15   Lance Bosch, NP  ibuprofen (ADVIL,MOTRIN) 800 MG tablet Take 1 tablet (800 mg total) by mouth every 8 (eight) hours as needed. 06/23/15   Melynda Ripple, MD  levonorgestrel-ethinyl estradiol Thompson Caul) 0.1-20 MG-MCG tablet Take 1 tablet by mouth daily.    Historical Provider, MD  montelukast (SINGULAIR) 10 MG tablet TAKE 1 TABLET BY MOUTH DAILY 05/06/15   Lance Bosch, NP   BP 145/91 mmHg  Pulse 76  Temp(Src) 97.6 F (36.4 C) (Oral)  Resp 18  Wt 199 lb (90.266 kg)  SpO2 100% Physical Exam  Constitutional: She is oriented to person, place, and time. She appears well-developed and well-nourished. No distress.  HENT:  Head: Normocephalic and atraumatic.  Eyes: EOM are normal.  Neck:  Normal range of motion.  Cardiovascular: Normal rate, regular rhythm and normal heart sounds.   Pulmonary/Chest: Effort normal and breath sounds normal.  Abdominal: Soft. She exhibits no distension. There is tenderness.  Mild suprapubic tenderness  Genitourinary:  The external genitalia appears normal. Cervix is unremarkable in appearance. There is a slight yellowish discharge present. There are no adnexal masses and no cervical motion tenderness.  Musculoskeletal: Normal range of motion.  Neurological: She is alert and oriented to person, place, and time.  Skin: Skin is warm and dry.  Psychiatric: She has a normal mood and affect. Judgment normal.  Nursing note and vitals reviewed.   ED Course  Procedures (including critical care time) DIAGNOSTIC STUDIES: Oxygen Saturation is 100% on RA,  normal by my interpretation.    COORDINATION OF CARE: 8:58 PM Discussed treatment plan which includes lab work, pelvic exam, and CT renal stone study with pt at bedside and pt agreed to plan.  Labs Review Labs Reviewed  URINALYSIS, ROUTINE W REFLEX MICROSCOPIC (NOT AT [facility]) - Abnormal; Notable for the following:    Hgb urine dipstick TRACE (*)    Leukocytes, UA TRACE (*)    All other components within normal limits  URINE MICROSCOPIC-ADD ON - Abnormal; Notable for the following:    Squamous Epithelial / LPF 0-5 (*)    Bacteria, UA RARE (*)    All other components within normal limits    Imaging Review No results found. I personally reviewed and evaluated these images and lab results as a part of my medical decision-making.   MDM   Final diagnoses:  None    Patient presents with complaints of bilateral flank pain, lower abdominal pressure and urinary urgency. Her urinalysis reveals trace blood and trace leukocytes and is unimpressive for a UTI. Her pregnancy test is negative and renal CT is negative. Pelvic examination reveals no obvious abnormalities. She does have many white cells on  her wet prep. GC and Chlamydia testing is pending. She will be discharged with antibiotics for a presumed UTI which she is convinced that she has. She is to return as needed if symptoms worsen.  I personally performed the services described in this documentation, which was scribed in my presence. The recorded information has been reviewed and is accurate.       Veryl Speak, MD 08/24/15 [phone]

## 2015-08-24 NOTE — ED Notes (Signed)
MD at bedside to discuss results of testing. 

## 2015-08-24 NOTE — Discharge Instructions (Signed)
Cipro as prescribed.  We will call you if your cultures indicate you require further treatment.  Please follow-up with your primary Dr. if not improving in the next week.   Dysuria Dysuria is pain or discomfort while urinating. The pain or discomfort may be felt in the tube that carries urine out of the bladder (urethra) or in the surrounding tissue of the genitals. The pain may also be felt in the groin area, lower abdomen, and lower back. You may have to urinate frequently or have the sudden feeling that you have to urinate (urgency). Dysuria can affect both men and women, but is more common in women. Dysuria can be caused by many different things, including:  Urinary tract infection in women.  Infection of the kidney or bladder.  Kidney stones or bladder stones.  Certain sexually transmitted infections (STIs), such as chlamydia.  Dehydration.  Inflammation of the vagina.  Use of certain medicines.  Use of certain soaps or scented products that cause irritation. HOME CARE INSTRUCTIONS Watch your dysuria for any changes. The following actions may help to reduce any discomfort you are feeling:  Drink enough fluid to keep your urine clear or pale yellow.  Empty your bladder often. Avoid holding urine for long periods of time.  After a bowel movement or urination, women should cleanse from front to back, using each tissue only once.  Empty your bladder after sexual intercourse.  Take medicines only as directed by your health care provider.  If you were prescribed an antibiotic medicine, finish it all even if you start to feel better.  Avoid caffeine, tea, and alcohol. They can irritate the bladder and make dysuria worse. In men, alcohol may irritate the prostate.  Keep all follow-up visits as directed by your health care provider. This is important.  If you had any tests done to find the cause of dysuria, it is your responsibility to obtain your test results. Ask the lab or  department performing the test when and how you will get your results. Talk with your health care provider if you have any questions about your results. SEEK MEDICAL CARE IF:  You develop pain in your back or sides.  You have a fever.  You have nausea or vomiting.  You have blood in your urine.  You are not urinating as often as you usually do. SEEK IMMEDIATE MEDICAL CARE IF:  You pain is severe and not relieved with medicines.  You are unable to hold down any fluids.  You or someone else notices a change in your mental function.  You have a rapid heartbeat at rest.  You have shaking or chills.  You feel extremely weak.   This information is not intended to replace advice given to you by your health care provider. Make sure you discuss any questions you have with your health care provider.   Document Released: 03/11/2004 Document Revised: 07/04/2014 Document Reviewed: 02/06/2014 Elsevier Interactive Patient Education [employer].

## 2015-08-24 NOTE — ED Notes (Signed)
Patient transported to CT via stretcher per tech. 

## 2015-08-24 NOTE — ED Notes (Signed)
MD at bedside. 

## 2015-08-24 NOTE — ED Notes (Signed)
Pt states diagnosed with UTI a few weeks ago. Pt states took all medications and now feels worse than before.

## 2015-08-26 ENCOUNTER — Encounter (HOSPITAL_COMMUNITY): Payer: Self-pay | Admitting: Emergency Medicine

## 2015-08-26 ENCOUNTER — Emergency Department (INDEPENDENT_AMBULATORY_CARE_PROVIDER_SITE_OTHER)
Admission: EM | Admit: 2015-08-26 | Discharge: 2015-08-26 | Disposition: A | Discharge status: Home / Self Care | Payer: No Typology Code available for payment source | Source: Home / Self Care | Attending: Family Medicine | Admitting: Family Medicine

## 2015-08-26 ENCOUNTER — Other Ambulatory Visit (HOSPITAL_COMMUNITY)
Admission: RE | Admit: 2015-08-26 | Discharge: 2015-08-26 | Disposition: A | Discharge status: Home / Self Care | Payer: No Typology Code available for payment source | Source: Ambulatory Visit | Attending: Family Medicine | Admitting: Family Medicine

## 2015-08-26 DIAGNOSIS — N23 Unspecified renal colic: Secondary | ICD-10-CM | POA: Insufficient documentation

## 2015-08-26 DIAGNOSIS — N390 Urinary tract infection, site not specified: Secondary | ICD-10-CM

## 2015-08-26 DIAGNOSIS — R82998 Other abnormal findings in urine: Secondary | ICD-10-CM

## 2015-08-26 LAB — POCT URINALYSIS DIP (DEVICE)
Bilirubin Urine: NEGATIVE
Glucose, UA: NEGATIVE mg/dL
Ketones, ur: NEGATIVE mg/dL
NITRITE: NEGATIVE
PH: 5.5 (ref 5.0–8.0)
PROTEIN: NEGATIVE mg/dL
Specific Gravity, Urine: >=1.03 (ref 1.005–1.030)
UROBILINOGEN UA: 0.2 mg/dL (ref 0.0–1.0)

## 2015-08-26 LAB — GC/CHLAMYDIA PROBE AMP (~~LOC~~) NOT AT ARMC
CHLAMYDIA, DNA PROBE: NEGATIVE
NEISSERIA GONORRHEA: NEGATIVE

## 2015-08-26 MED ORDER — NAPROXEN SODIUM 550 MG PO TABS: 550.0000 mg | ORAL_TABLET | Freq: Two times a day (BID) | ORAL | Status: DC

## 2015-08-26 NOTE — Discharge Instructions (Signed)
Asymptomatic Bacteriuria, Female Asymptomatic bacteriuria is the presence of a large number of bacteria in your urine without the usual symptoms of burning or frequent urination. The following conditions increase the risk of asymptomatic bacteriuria:  Diabetes mellitus.  Advanced age.  Pregnancy in the first trimester.  Kidney stones.  Kidney transplants.  Leaky kidney tube valve in young children (reflux). Treatment for this condition is not needed in most people and can lead to other problems such as too much yeast and growth of resistant bacteria. However, some people, such as pregnant women, do need treatment to prevent kidney infection. Asymptomatic bacteriuria in pregnancy is also associated with fetal growth restriction, premature labor, and newborn death. HOME CARE INSTRUCTIONS Monitor your condition for any changes. The following actions may help to relieve any discomfort you are feeling:  Drink enough water and fluids to keep your urine clear or pale yellow. Go to the bathroom more often to keep your bladder empty.  Keep the area around your vagina and rectum clean. Wipe yourself from front to back after urinating. SEEK IMMEDIATE MEDICAL CARE IF:  You develop signs of an infection such as:  Burning with urination.  Frequency of voiding.  Back pain.  Fever.  You have blood in the urine.  You develop a fever. MAKE SURE YOU:  Understand these instructions.  Will watch your condition.  Will get help right away if you are not doing well or get worse.   This information is not intended to replace advice given to you by your health care provider. Make sure you discuss any questions you have with your health care provider.   Document Released: 06/13/2005 Document Revised: 07/04/2014 Document Reviewed: 12/03/2012 Elsevier Interactive Patient Education 2016 Elsevier Inc[employer] Hematuria, Adult Hematuria is blood in your urine. It can be caused by a bladder infection,  kidney infection, prostate infection, kidney stone, or cancer of your urinary tract. Infections can usually be treated with medicine, and a kidney stone usually will pass through your urine. If neither of these is the cause of your hematuria, further workup to find out the reason may be needed. It is very important that you tell your health care provider about any blood you see in your urine, even if the blood stops without treatment or happens without causing pain. Blood in your urine that happens and then stops and then happens again can be a symptom of a very serious condition. Also, pain is not a symptom in the initial stages of many urinary cancers. HOME CARE INSTRUCTIONS   Drink lots of fluid, 3-4 quarts a day. If you have been diagnosed with an infection, cranberry juice is especially recommended, in addition to large amounts of water.  Avoid caffeine, tea, and carbonated beverages because they tend to irritate the bladder.  Avoid alcohol because it may irritate the prostate.  Take all medicines as directed by your health care provider.  If you were prescribed an antibiotic medicine, finish it all even if you start to feel better.  If you have been diagnosed with a kidney stone, follow your health care provider's instructions regarding straining your urine to catch the stone.  Empty your bladder often. Avoid holding urine for long periods of time.  After a bowel movement, women should cleanse front to back. Use each tissue only once.  Empty your bladder before and after sexual intercourse if you are a female. SEEK MEDICAL CARE IF:  You develop back pain.  You have a fever.  You have a  feeling of sickness in your stomach (nausea) or vomiting.  Your symptoms are not better in 3 days. Return sooner if you are getting worse. SEEK IMMEDIATE MEDICAL CARE IF:   You develop severe vomiting and are unable to keep the medicine down.  You develop severe back or abdominal pain despite  taking your medicines.  You begin passing a large amount of blood or clots in your urine.  You feel extremely weak or faint, or you pass out. MAKE SURE YOU:   Understand these instructions.  Will watch your condition.  Will get help right away if you are not doing well or get worse.   This information is not intended to replace advice given to you by your health care provider. Make sure you discuss any questions you have with your health care provider.   Document Released: 06/13/2005 Document Revised: 07/04/2014 Document Reviewed: 02/11/2013 Elsevier Interactive Patient Education [employer].

## 2015-08-26 NOTE — ED Provider Notes (Signed)
CSN: [id number]     Arrival date & time 08/26/15  1920 History   First MD Initiated Contact with Patient 08/26/15 2024     Chief Complaint  Patient presents with  . Urinary Tract Infection  . Back Pain   (Consider location/radiation/quality/duration/timing/severity/associated sxs/prior Treatment) HPI Pt was seen in ED just a few days ago, for possible renal stones. States she continues to have pain, and blood in urine and symptoms are not better. Was not referred to urology. Currently taking cipro, ibuprofen for pain. No relief.  Past Medical History  Diagnosis Date  . Headache(784.0)     sinus  . GERD (gastroesophageal reflux disease)   . Kidney stones     no current problem  . Anxiety   . Deviated septum 09/2011  . 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  . Complication of anesthesia     states small mouth  . Pelvic inflammatory disease   . Urinary tract infection    Past Surgical History  Procedure Laterality Date  . Polyps removed      colon  . Wisdom tooth extraction    . Nasal septoplasty w/ turbinoplasty  10/04/2011    Procedure: NASAL SEPTOPLASTY WITH TURBINATE REDUCTION;  Surgeon: Ascencion Dike, MD;  Location: [facility];  Service: ENT;  Laterality: Bilateral;   Family History  Problem Relation Age of Onset  . Cancer Mother   . Hypertension Mother   . Hypertension Father   . Liver disease Father   . Cancer Brother   . Hypertension Other    Social History  Substance Use Topics  . Smoking status: Former Smoker -- 0.25 packs/day    Quit date: 08/23/2009  . Smokeless tobacco: Never Used  . Alcohol Use: No   OB History    Gravida Para Term Preterm AB TAB SAB Ectopic Multiple Living   4 4 4       4      Review of Systems ROS +'ve flank pain  Denies: HEADACHE, NAUSEA,  CHEST PAIN, CONGESTION, DYSURIA, SHORTNESS OF BREATH  Allergies  Macrobid and Sulfa antibiotics  Home Medications   Prior to Admission medications   Medication Sig  Start Date End Date Taking? Authorizing Provider  acetaminophen-codeine (TYLENOL #3) 300-30 MG per tablet Take 1 tablet by mouth every 6 (six) hours as needed for moderate pain. 01/15/15   Lance Bosch, NP  amitriptyline (ELAVIL) 25 MG tablet Take 25 mg by mouth at bedtime.     Historical Provider, MD  cephALEXin (KEFLEX) 500 MG capsule Take 1 capsule (500 mg total) by mouth 4 (four) times daily. Patient not taking: Reported on 08/26/2015 07/26/15   Melony Overly, MD  ciprofloxacin (CIPRO) 500 MG tablet Take 1 tablet (500 mg total) by mouth 2 (two) times daily. 08/24/15   Veryl Speak, MD  cyclobenzaprine (FLEXERIL) 5 MG tablet Take 1 tablet (5 mg total) by mouth every 8 (eight) hours as needed for muscle spasms. 05/14/15 05/13/16  Schuyler Amor, MD  fluconazole (DIFLUCAN) 150 MG tablet Take 1 tablet (150 mg total) by mouth once. May repeat in one week if needed 07/27/15   Lance Bosch, NP  ibuprofen (ADVIL,MOTRIN) 800 MG tablet Take 1 tablet (800 mg total) by mouth every 8 (eight) hours as needed. 06/23/15   Melynda Ripple, MD  levonorgestrel-ethinyl estradiol Thompson Caul) 0.1-20 MG-MCG tablet Take 1 tablet by mouth daily.    Historical Provider, MD  montelukast (SINGULAIR) 10 MG tablet TAKE 1  TABLET BY MOUTH DAILY 05/06/15   Lance Bosch, NP  naproxen sodium (ANAPROX DS) 550 MG tablet Take 1 tablet (550 mg total) by mouth 2 (two) times daily with a meal. 08/26/15   Konrad Felix, PA   Meds Ordered and Administered this Visit  Medications - No data to display  BP 140/89 mmHg  Pulse 100  Temp(Src) 97.5 F (36.4 C) (Oral)  Resp 16  SpO2 100% No data found.   Physical Exam  Constitutional: She is oriented to person, place, and time. She appears well-developed and well-nourished.  HENT:  Head: Normocephalic and atraumatic.  Eyes: Conjunctivae are normal.  Neck: Normal range of motion. Neck supple.  Abdominal: Soft.  Mild right flank pain  Musculoskeletal: Normal range of motion.   Neurological: She is alert and oriented to person, place, and time.  Skin: Skin is warm and dry.  Psychiatric: She has a normal mood and affect. Her behavior is normal.  Nursing note and vitals reviewed.   ED Course  Procedures (including critical care time)  Labs Review Labs Reviewed  POCT URINALYSIS DIP (DEVICE) - Abnormal; Notable for the following:    Hgb urine dipstick MODERATE (*)    Leukocytes, UA TRACE (*)    All other components within normal limits  URINE CULTURE    Imaging Review Ct Renal Stone Study  08/24/2015  CLINICAL DATA:  Chronic suprapubic pain, acutely worsening, with trace hematuria. Initial encounter. EXAM: CT ABDOMEN AND PELVIS WITHOUT CONTRAST TECHNIQUE: Multidetector CT imaging of the abdomen and pelvis was performed following the standard protocol without IV contrast. COMPARISON:  CT of the abdomen and pelvis performed 05/14/2015, and renal ultrasound performed 05/23/2015 FINDINGS: The visualized lung bases are clear. The liver and spleen are unremarkable in appearance. The gallbladder is within normal limits. The pancreas and adrenal glands are unremarkable. A few small nonobstructing right renal stones are seen, measuring up to 3 mm in size. There is no evidence of hydronephrosis. No obstructing ureteral stones are seen. No perinephric stranding is appreciated. No free fluid is identified. The small bowel is unremarkable in appearance. The stomach is within normal limits. No acute vascular abnormalities are seen. The appendix is normal in caliber, without evidence of appendicitis. The colon is unremarkable in appearance. The bladder is mildly distended and grossly unremarkable. The uterus is unremarkable in appearance. The ovaries are relatively symmetric. No suspicious adnexal masses are seen. No inguinal lymphadenopathy is seen. No acute osseous abnormalities are identified. IMPRESSION: 1. No acute abnormality seen within the abdomen or pelvis. 2. Few small  nonobstructing right renal stones, measuring up to 3 mm in size. Electronically Signed   By: Garald Balding M.D.   On: 08/24/2015 22:18     Visual Acuity Review  Right Eye Distance:   Left Eye Distance:   Bilateral Distance:    Right Eye Near:   Left Eye Near:    Bilateral Near:        Discussed findings of CT and UA done tonight. Suggest follow up with urology. Rx for anaprox MDM   1. Renal pain   2. Urine leukocytes         Konrad Felix, [place] 08/26/15 2105

## 2015-08-26 NOTE — ED Notes (Signed)
Patient is concerned with a uti or kidney stone.  Seen in ed 08/24/15 diagnosed with "presumed uti" and started on cipro.  Patient here tonight for continued low back pain and lo, center abdominal pressure.

## 2015-08-27 ENCOUNTER — Other Ambulatory Visit: Payer: Self-pay

## 2015-08-27 DIAGNOSIS — R109 Unspecified abdominal pain: Secondary | ICD-10-CM

## 2015-08-27 DIAGNOSIS — R319 Hematuria, unspecified: Secondary | ICD-10-CM

## 2015-08-28 LAB — URINE CULTURE

## 2015-08-29 ENCOUNTER — Emergency Department
Admission: EM | Admit: 2015-08-29 | Discharge: 2015-08-29 | Disposition: A | Discharge status: Home / Self Care | Payer: No Typology Code available for payment source | Attending: Emergency Medicine | Admitting: Emergency Medicine

## 2015-08-29 DIAGNOSIS — Z792 Long term (current) use of antibiotics: Secondary | ICD-10-CM | POA: Insufficient documentation

## 2015-08-29 DIAGNOSIS — Z87891 Personal history of nicotine dependence: Secondary | ICD-10-CM | POA: Insufficient documentation

## 2015-08-29 DIAGNOSIS — Z793 Long term (current) use of hormonal contraceptives: Secondary | ICD-10-CM | POA: Insufficient documentation

## 2015-08-29 DIAGNOSIS — Z79899 Other long term (current) drug therapy: Secondary | ICD-10-CM | POA: Insufficient documentation

## 2015-08-29 DIAGNOSIS — Y92410 Unspecified street and highway as the place of occurrence of the external cause: Secondary | ICD-10-CM | POA: Insufficient documentation

## 2015-08-29 DIAGNOSIS — Z791 Long term (current) use of non-steroidal anti-inflammatories (NSAID): Secondary | ICD-10-CM | POA: Insufficient documentation

## 2015-08-29 DIAGNOSIS — S0990XA Unspecified injury of head, initial encounter: Secondary | ICD-10-CM | POA: Insufficient documentation

## 2015-08-29 DIAGNOSIS — Y998 Other external cause status: Secondary | ICD-10-CM | POA: Insufficient documentation

## 2015-08-29 DIAGNOSIS — S29012A Strain of muscle and tendon of back wall of thorax, initial encounter: Secondary | ICD-10-CM

## 2015-08-29 DIAGNOSIS — Y9389 Activity, other specified: Secondary | ICD-10-CM | POA: Insufficient documentation

## 2015-08-29 MED ORDER — MELOXICAM 15 MG PO TABS: 15.0000 mg | ORAL_TABLET | Freq: Every day | ORAL | Status: DC

## 2015-08-29 MED ORDER — METHOCARBAMOL 500 MG PO TABS: 500.0000 mg | ORAL_TABLET | Freq: Four times a day (QID) | ORAL | Status: DC
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
## 2015-08-31 ENCOUNTER — Ambulatory Visit: Payer: Self-pay | Admitting: Skilled Nursing Facility1

## 2015-09-07 ENCOUNTER — Other Ambulatory Visit: Payer: Self-pay | Admitting: Internal Medicine

## 2015-09-07 MED FILL — ?MONTELUKAST SOD 10 MG TAB: 10 | 30 days supply | Qty: 30 | Fill #0

## 2015-09-09 HISTORY — PX: COLONOSCOPY: SHX174

## 2015-09-24 ENCOUNTER — Emergency Department (INDEPENDENT_AMBULATORY_CARE_PROVIDER_SITE_OTHER)
Admission: EM | Admit: 2015-09-24 | Discharge: 2015-09-24 | Disposition: A | Discharge status: Home / Self Care | Payer: Self-pay | Source: Home / Self Care | Attending: Family Medicine | Admitting: Family Medicine

## 2015-09-24 ENCOUNTER — Encounter (HOSPITAL_COMMUNITY): Payer: Self-pay

## 2015-09-24 DIAGNOSIS — J069 Acute upper respiratory infection, unspecified: Secondary | ICD-10-CM

## 2015-09-24 MED ORDER — AZITHROMYCIN 250 MG PO TABS: 250.0000 mg | ORAL_TABLET | Freq: Every day | ORAL | Status: DC

## 2015-09-24 MED ORDER — ALBUTEROL SULFATE HFA 108 (90 BASE) MCG/ACT IN AERS: 1.0000 | INHALATION_SPRAY | Freq: Four times a day (QID) | RESPIRATORY_TRACT | Status: DC | PRN

## 2015-09-24 MED ORDER — BENZONATATE 100 MG PO CAPS: 100.0000 mg | ORAL_CAPSULE | Freq: Three times a day (TID) | ORAL | Status: DC

## 2015-09-24 NOTE — ED Notes (Signed)
Patient states she has had a cough, sore throat, chest pain, and nausea x6 days, thinks she may have possible flu. Patient has taken Sudafed, Mucinex, and Tylenol for symptoms, the last times these medications were taken was this afternoon 09/24/2015 around noon. No acute distress

## 2015-09-24 NOTE — Discharge Instructions (Signed)
It is a pleasure to see you today. I do not believe you have the flu.   For your cough, Tessalon perles 100mg , take 1 to 2 perles by mouth every 8 hours as needed. Albuterol inhaler 2 puffs every 6 hrs as needed cough or shortness of breath.   Do not take the azithromycin unless you feel more ill as we discussed in the visit.   Follow up with your primary doctor in the coming week.

## 2015-09-24 NOTE — ED Provider Notes (Signed)
CSN: [id number]     Arrival date & time 09/24/15  1646 History   First MD Initiated Contact with Patient 09/24/15 1831     Chief Complaint  Patient presents with  . Cough  . Chest Pain   (Consider location/radiation/quality/duration/timing/severity/associated sxs/prior Treatment) Patient is a 35 y.o. female presenting with cough and chest pain. The history is provided by the patient. No language interpreter was used.  Cough Associated symptoms: chest pain and rhinorrhea   Associated symptoms: no chills, no fever, no shortness of breath, no sore throat and no wheezing   Chest Pain Associated symptoms: cough   Associated symptoms: no abdominal pain, no dysphagia, no fever, no nausea, no shortness of breath and not vomiting   Patient presents with complaint of cough, nasal congestion and chest pain with coughing, malaise, onset Sat March 25th.  Initial sx was nasal congestion.  Within a couple days began to develop cough. Pain in upper central chest with forceful coughing. Brings up green sputum.   History deviated septum with surgery, still with difficulty breathing out R naris. Has taken Sudafed every 6 hrs, Mucinex DM twice daily, Netty pot, without relief.   Social Hx. Former smoker (quit years ago).     Past Medical History  Diagnosis Date  . Headache(784.0)     sinus  . GERD (gastroesophageal reflux disease)   . Kidney stones     no current problem  . Anxiety   . Deviated septum 09/2011  . 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  . Complication of anesthesia     states small mouth  . Pelvic inflammatory disease   . Urinary tract infection    Past Surgical History  Procedure Laterality Date  . Polyps removed      colon  . Wisdom tooth extraction    . Nasal septoplasty w/ turbinoplasty  10/04/2011    Procedure: NASAL SEPTOPLASTY WITH TURBINATE REDUCTION;  Surgeon: Ascencion Dike, MD;  Location: [facility];  Service: ENT;  Laterality: Bilateral;   Family  History  Problem Relation Age of Onset  . Cancer Mother   . Hypertension Mother   . Hypertension Father   . Liver disease Father   . Cancer Brother   . Hypertension Other    Social History  Substance Use Topics  . Smoking status: Former Smoker -- 0.25 packs/day    Quit date: 08/23/2009  . Smokeless tobacco: Never Used  . Alcohol Use: No   OB History    Gravida Para Term Preterm AB TAB SAB Ectopic Multiple Living   4 4 4       4      Review of Systems  Constitutional: Negative for fever and chills.  HENT: Positive for congestion and rhinorrhea. Negative for postnasal drip, sore throat and trouble swallowing.   Respiratory: Positive for cough. Negative for chest tightness, shortness of breath and wheezing.   Cardiovascular: Positive for chest pain.  Gastrointestinal: Negative for nausea, vomiting, abdominal pain and diarrhea.    Allergies  Macrobid and Sulfa antibiotics  Home Medications   Prior to Admission medications   Medication Sig Start Date End Date Taking? Authorizing Provider  amitriptyline (ELAVIL) 25 MG tablet Take 25 mg by mouth at bedtime.    Yes Historical Provider, MD  levonorgestrel-ethinyl estradiol (AUBRA) 0.1-20 MG-MCG tablet Take 1 tablet by mouth daily.   Yes Historical Provider, MD  montelukast (SINGULAIR) 10 MG tablet TAKE 1 TABLET BY MOUTH DAILY 09/07/15  Yes Jannifer Rodney  Feliciana Rossetti, NP  acetaminophen-codeine (TYLENOL #3) 300-30 MG per tablet Take 1 tablet by mouth every 6 (six) hours as needed for moderate pain. 01/15/15   Lance Bosch, NP  albuterol (PROVENTIL HFA;VENTOLIN HFA) 108 (90 Base) MCG/ACT inhaler Inhale 1-2 puffs into the lungs every 6 (six) hours as needed for wheezing or shortness of breath. 09/24/15   Willeen Niece, MD  azithromycin (ZITHROMAX) 250 MG tablet Take 1 tablet (250 mg total) by mouth daily. Take first 2 tablets together, then 1 every day until finished. 09/24/15   Willeen Niece, MD  benzonatate (TESSALON) 100 MG capsule Take 1 capsule  (100 mg total) by mouth every 8 (eight) hours. 09/24/15   Willeen Niece, MD  cephALEXin (KEFLEX) 500 MG capsule Take 1 capsule (500 mg total) by mouth 4 (four) times daily. Patient not taking: Reported on 08/26/2015 07/26/15   Melony Overly, MD  ciprofloxacin (CIPRO) 500 MG tablet Take 1 tablet (500 mg total) by mouth 2 (two) times daily. 08/24/15   Veryl Speak, MD  cyclobenzaprine (FLEXERIL) 5 MG tablet Take 1 tablet (5 mg total) by mouth every 8 (eight) hours as needed for muscle spasms. 05/14/15 05/13/16  Schuyler Amor, MD  fluconazole (DIFLUCAN) 150 MG tablet Take 1 tablet (150 mg total) by mouth once. May repeat in one week if needed 07/27/15   Lance Bosch, NP  ibuprofen (ADVIL,MOTRIN) 800 MG tablet Take 1 tablet (800 mg total) by mouth every 8 (eight) hours as needed. 06/23/15   Melynda Ripple, MD  meloxicam (MOBIC) 15 MG tablet Take 1 tablet (15 mg total) by mouth daily. 08/29/15   Charline Bills Cuthriell, PA-C  methocarbamol (ROBAXIN) 500 MG tablet Take 1 tablet (500 mg total) by mouth 4 (four) times daily. 08/29/15   Charline Bills Cuthriell, PA-C  naproxen sodium (ANAPROX DS) 550 MG tablet Take 1 tablet (550 mg total) by mouth 2 (two) times daily with a meal. 08/26/15   Konrad Felix, PA   Meds Ordered and Administered this Visit  Medications - No data to display  BP 142/86 mmHg  Pulse 80  Temp(Src) 99.2 F (37.3 C) (Oral)  Resp 16  SpO2 100% No data found.   Physical Exam  Constitutional: She appears well-developed and well-nourished. No distress.  Alert, not in distress.   HENT:  Head: Normocephalic and atraumatic.  Right Ear: External ear normal.  Left Ear: External ear normal.  Mouth/Throat: Oropharynx is clear and moist. No oropharyngeal exudate.  Watery nasal discharge  Eyes: Conjunctivae and EOM are normal. Pupils are equal, round, and reactive to light. Right eye exhibits no discharge. Left eye exhibits no discharge.  Neck: Normal range of motion. Neck supple. No tracheal  deviation present. No thyromegaly present.  Shotty anterior cervical adenopathy  Cardiovascular: Normal rate, regular rhythm and normal heart sounds.   No murmur heard. Pulmonary/Chest: Effort normal and breath sounds normal. No stridor. No respiratory distress. She has no wheezes. She has no rales. She exhibits no tenderness.  Abdominal: Soft.  Lymphadenopathy:    She has cervical adenopathy.  Skin: She is not diaphoretic.    ED Course  Procedures (including critical care time)  Labs Review Labs Reviewed - No data to display  Imaging Review No results found.   Visual Acuity Review  Right Eye Distance:   Left Eye Distance:   Bilateral Distance:    Right Eye Near:   Left Eye Near:    Bilateral Near:  MDM   1. Viral upper respiratory infection    Patient with worsening URI sxs, coughing paroxysms. Already taking OTC mucolytics and decongestants. For Tessalon, Albuterol prn.  Discussed reason why I do not believe bacterial and will not benefit from abx tx. Rx with strict parameters for if/when to initiate.  Follow up with Chari Manning. Note for work.     Willeen Niece, MD 09/24/15 [id number]

## 2015-10-02 ENCOUNTER — Emergency Department (HOSPITAL_COMMUNITY)
Admission: EM | Admit: 2015-10-02 | Discharge: 2015-10-02 | Disposition: A | Discharge status: Home / Self Care | Payer: Self-pay | Attending: Emergency Medicine | Admitting: Emergency Medicine

## 2015-10-02 ENCOUNTER — Encounter (HOSPITAL_COMMUNITY): Payer: Self-pay | Admitting: Emergency Medicine

## 2015-10-02 DIAGNOSIS — G43909 Migraine, unspecified, not intractable, without status migrainosus: Secondary | ICD-10-CM | POA: Insufficient documentation

## 2015-10-02 DIAGNOSIS — Z8719 Personal history of other diseases of the digestive system: Secondary | ICD-10-CM | POA: Insufficient documentation

## 2015-10-02 DIAGNOSIS — Z8742 Personal history of other diseases of the female genital tract: Secondary | ICD-10-CM | POA: Insufficient documentation

## 2015-10-02 DIAGNOSIS — G43009 Migraine without aura, not intractable, without status migrainosus: Secondary | ICD-10-CM

## 2015-10-02 DIAGNOSIS — Z79899 Other long term (current) drug therapy: Secondary | ICD-10-CM | POA: Insufficient documentation

## 2015-10-02 DIAGNOSIS — F419 Anxiety disorder, unspecified: Secondary | ICD-10-CM | POA: Insufficient documentation

## 2015-10-02 DIAGNOSIS — Z792 Long term (current) use of antibiotics: Secondary | ICD-10-CM | POA: Insufficient documentation

## 2015-10-02 DIAGNOSIS — Z87440 Personal history of urinary (tract) infections: Secondary | ICD-10-CM | POA: Insufficient documentation

## 2015-10-02 DIAGNOSIS — Z87891 Personal history of nicotine dependence: Secondary | ICD-10-CM | POA: Insufficient documentation

## 2015-10-02 DIAGNOSIS — Z87442 Personal history of urinary calculi: Secondary | ICD-10-CM | POA: Insufficient documentation

## 2015-10-02 DIAGNOSIS — G500 Trigeminal neuralgia: Secondary | ICD-10-CM | POA: Insufficient documentation

## 2015-10-02 MED ORDER — SODIUM CHLORIDE 0.9 % IV BOLUS (SEPSIS)
1000.0000 mL | Freq: Once | INTRAVENOUS | Status: AC
  Administered 2015-10-02: 1000 mL via INTRAVENOUS

## 2015-10-02 MED ORDER — KETOROLAC TROMETHAMINE 30 MG/ML IJ SOLN
30.0000 mg | Freq: Once | INTRAMUSCULAR | Status: AC
  Administered 2015-10-02: 30 mg via INTRAVENOUS
  Filled 2015-10-02: qty 1

## 2015-10-02 MED ORDER — METOCLOPRAMIDE HCL 5 MG/ML IJ SOLN
10.0000 mg | Freq: Once | INTRAMUSCULAR | Status: AC
  Administered 2015-10-02: 10 mg via INTRAVENOUS
  Filled 2015-10-02: qty 2

## 2015-10-02 MED ORDER — DEXAMETHASONE SODIUM PHOSPHATE 10 MG/ML IJ SOLN
10.0000 mg | Freq: Once | INTRAMUSCULAR | Status: AC
  Administered 2015-10-02: 10 mg via INTRAVENOUS
  Filled 2015-10-02: qty 1

## 2015-10-02 MED ORDER — DIPHENHYDRAMINE HCL 50 MG/ML IJ SOLN
25.0000 mg | Freq: Once | INTRAMUSCULAR | Status: AC
  Administered 2015-10-02: 25 mg via INTRAVENOUS
  Filled 2015-10-02: qty 1

## 2015-10-02 NOTE — Discharge Instructions (Signed)
Neuropathic Pain Neuropathic pain is pain caused by damage to the nerves that are responsible for certain sensations in your body (sensory nerves). The pain can be caused by damage to:   The sensory nerves that send signals to your spinal cord and brain (peripheral nervous system).  The sensory nerves in your brain or spinal cord (central nervous system). Neuropathic pain can make you more sensitive to pain. What would be a minor sensation for most people may feel very painful if you have neuropathic pain. This is usually a long-term condition that can be difficult to treat. The type of pain can differ from person to person. It may start suddenly (acute), or it may develop slowly and last for a long time (chronic). Neuropathic pain may come and go as damaged nerves heal or may stay at the same level for years. It often causes emotional distress, loss of sleep, and a lower quality of life. CAUSES  The most common cause of damage to a sensory nerve is diabetes. Many other diseases and conditions can also cause neuropathic pain. Causes of neuropathic pain can be classified as:  Toxic. Many drugs and chemicals can cause toxic damage. The most common cause of toxic neuropathic pain is damage from drug treatment for cancer (chemotherapy).  Metabolic. This type of pain can happen when a disease causes imbalances that damage nerves. Diabetes is the most common of these diseases. Vitamin B deficiency caused by long-term alcohol abuse is another common cause.  Traumatic. Any injury that cuts, crushes, or stretches a nerve can cause damage and pain. A common example is feeling pain after losing an arm or leg (phantom limb pain).  Compression-related. If a sensory nerve gets trapped or compressed for a long period of time, the blood supply to the nerve can be cut off.  Vascular. Many blood vessel diseases can cause neuropathic pain by decreasing blood supply and oxygen to nerves.  Autoimmune. This type of  pain results from diseases in which the body's defense system mistakenly attacks sensory nerves. Examples of autoimmune diseases that can cause neuropathic pain include lupus and multiple sclerosis.  Infectious. Many types of viral infections can damage sensory nerves and cause pain. Shingles infection is a common cause of this type of pain.  Inherited. Neuropathic pain can be a symptom of many diseases that are passed down through families (genetic). SIGNS AND SYMPTOMS  The main symptom is pain. Neuropathic pain is often described as:  Burning.  Shock-like.  Stinging.  Hot or cold.  Itching. DIAGNOSIS  No single test can diagnose neuropathic pain. Your health care provider will do a physical exam and ask you about your pain. You may use a pain scale to describe how bad your pain is. You may also have tests to see if you have a high sensitivity to pain and to help find the cause and location of any sensory nerve damage. These tests may include:  Imaging studies, such as:  X-rays.  CT scan.  MRI.  Nerve conduction studies to test how well nerve signals travel through your sensory nerves (electrodiagnostic testing).  Stimulating your sensory nerves through electrodes on your skin and measuring the response in your spinal cord and brain (somatosensory evoked potentials). TREATMENT  Treatment for neuropathic pain may change over time. You may need to try different treatment options or a combination of treatments. Some options include:  Over-the-counter pain relievers.  Prescription medicines. Some medicines used to treat other conditions may also help neuropathic pain. These  include medicines to:  Control seizures (anticonvulsants).  Relieve depression (antidepressants).  Prescription-strength pain relievers (narcotics). These are usually used when other pain relievers do not help.  Transcutaneous nerve stimulation (TENS). This uses electrical currents to block painful nerve  signals. The treatment is painless.  Topical and local anesthetics. These are medicines that numb the nerves. They can be injected as a nerve block or applied to the skin.  Alternative treatments, such as:  Acupuncture.  Meditation.  Massage.  Physical therapy.  Pain management programs.  Counseling. HOME CARE INSTRUCTIONS  Learn as much as you can about your condition.  Take medicines only as directed by your health care provider.  Work closely with all your health care providers to find what works best for you.  Have a good support system at home.  Consider joining a chronic pain support group. SEEK MEDICAL CARE IF:  Your pain treatments are not helping.  You are having side effects from your medicines.  You are struggling with fatigue, mood changes, depression, or anxiety.   This information is not intended to replace advice given to you by your health care provider. Make sure you discuss any questions you have with your health care provider.   Document Released: 03/10/2004 Document Revised: 07/04/2014 Document Reviewed: 11/21/2013 Elsevier Interactive Patient Education 2016 [employer].  Migraine Headache A migraine headache is an intense, throbbing pain on one or both sides of your head. A migraine can last for 30 minutes to several hours. CAUSES  The exact cause of a migraine headache is not always known. However, a migraine may be caused when nerves in the brain become irritated and release chemicals that cause inflammation. This causes pain. Certain things may also trigger migraines, such as:  Alcohol.  Smoking.  Stress.  Menstruation.  Aged cheeses.  Foods or drinks that contain nitrates, glutamate, aspartame, or tyramine.  Lack of sleep.  Chocolate.  Caffeine.  Hunger.  Physical exertion.  Fatigue.  Medicines used to treat chest pain (nitroglycerine), birth control pills, estrogen, and some blood pressure medicines. SIGNS AND  SYMPTOMS  Pain on one or both sides of your head.  Pulsating or throbbing pain.  Severe pain that prevents daily activities.  Pain that is aggravated by any physical activity.  Nausea, vomiting, or both.  Dizziness.  Pain with exposure to bright lights, loud noises, or activity.  General sensitivity to bright lights, loud noises, or smells. Before you get a migraine, you may get warning signs that a migraine is coming (aura). An aura may include:  Seeing flashing lights.  Seeing bright spots, halos, or zigzag lines.  Having tunnel vision or blurred vision.  Having feelings of numbness or tingling.  Having trouble talking.  Having muscle weakness. DIAGNOSIS  A migraine headache is often diagnosed based on:  Symptoms.  Physical exam.  A CT scan or MRI of your head. These imaging tests cannot diagnose migraines, but they can help rule out other causes of headaches. TREATMENT Medicines may be given for pain and nausea. Medicines can also be given to help prevent recurrent migraines.  HOME CARE INSTRUCTIONS  Only take over-the-counter or prescription medicines for pain or discomfort as directed by your health care provider. The use of long-term narcotics is not recommended.  Lie down in a dark, quiet room when you have a migraine.  Keep a journal to find out what may trigger your migraine headaches. For example, write down:  What you eat and drink.  How much sleep you  get.  Any change to your diet or medicines.  Limit alcohol consumption.  Quit smoking if you smoke.  Get 7-9 hours of sleep, or as recommended by your health care provider.  Limit stress.  Keep lights dim if bright lights bother you and make your migraines worse. SEEK IMMEDIATE MEDICAL CARE IF:   Your migraine becomes severe.  You have a fever.  You have a stiff neck.  You have vision loss.  You have muscular weakness or loss of muscle control.  You start losing your balance or have  trouble walking.  You feel faint or pass out.  You have severe symptoms that are different from your first symptoms. MAKE SURE YOU:   Understand these instructions.  Will watch your condition.  Will get help right away if you are not doing well or get worse.   This information is not intended to replace advice given to you by your health care provider. Make sure you discuss any questions you have with your health care provider.   Document Released: 06/13/2005 Document Revised: 07/04/2014 Document Reviewed: 02/18/2013 Elsevier Interactive Patient Education [employer].

## 2015-10-02 NOTE — ED Notes (Addendum)
Pt c/o shooting "lightening" intermittent left anterior head pain superior to eye onset yesterday, pain with palpation to temples bilaterally. Patient woke up this morning with blurred vision. No history of similar headache. CN II-XII intact. Negative Romberg.

## 2015-10-02 NOTE — ED Provider Notes (Signed)
CSN: [id number]     Arrival date & time 10/02/15  1011 History   First MD Initiated Contact with Patient 10/02/15 1040     Chief Complaint  Patient presents with  . Headache  . Blurred Vision     (Consider location/radiation/quality/duration/timing/severity/associated sxs/prior Treatment) Patient is a 35 y.o. female presenting with headaches. The history is provided by the patient.  Headache Pain location:  Frontal Quality:  Sharp Radiates to:  Does not radiate Onset quality:  Gradual Duration:  1 day Timing:  Sporadic Progression:  Waxing and waning Chronicity:  New Similar to prior headaches: no   Context: activity   Relieved by:  Nothing Worsened by:  Nothing Ineffective treatments:  NSAIDs and acetaminophen Associated symptoms: blurred vision   Associated symptoms: no fever, no loss of balance, no neck stiffness, no photophobia and no vomiting     Past Medical History  Diagnosis Date  . Headache(784.0)     sinus  . GERD (gastroesophageal reflux disease)   . Kidney stones     no current problem  . Anxiety   . Deviated septum 09/2011  . 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  . Complication of anesthesia     states small mouth  . Pelvic inflammatory disease   . Urinary tract infection    Past Surgical History  Procedure Laterality Date  . Polyps removed      colon  . Wisdom tooth extraction    . Nasal septoplasty w/ turbinoplasty  10/04/2011    Procedure: NASAL SEPTOPLASTY WITH TURBINATE REDUCTION;  Surgeon: Ascencion Dike, MD;  Location: [facility];  Service: ENT;  Laterality: Bilateral;   Family History  Problem Relation Age of Onset  . Cancer Mother   . Hypertension Mother   . Hypertension Father   . Liver disease Father   . Cancer Brother   . Hypertension Other    Social History  Substance Use Topics  . Smoking status: Former Smoker -- 0.25 packs/day    Quit date: 08/23/2009  . Smokeless tobacco: Never Used  . Alcohol Use: No    OB History    Gravida Para Term Preterm AB TAB SAB Ectopic Multiple Living   4 4 4       4      Review of Systems  Constitutional: Negative for fever.  Eyes: Positive for blurred vision. Negative for photophobia.  Gastrointestinal: Negative for vomiting.  Musculoskeletal: Negative for neck stiffness.  Neurological: Positive for headaches. Negative for loss of balance.  All other systems reviewed and are negative.     Allergies  Macrobid and Sulfa antibiotics  Home Medications   Prior to Admission medications   Medication Sig Start Date End Date Taking? Authorizing Provider  acetaminophen-codeine (TYLENOL #3) 300-30 MG per tablet Take 1 tablet by mouth every 6 (six) hours as needed for moderate pain. 01/15/15   Lance Bosch, NP  albuterol (PROVENTIL HFA;VENTOLIN HFA) 108 (90 Base) MCG/ACT inhaler Inhale 1-2 puffs into the lungs every 6 (six) hours as needed for wheezing or shortness of breath. 09/24/15   Willeen Niece, MD  amitriptyline (ELAVIL) 25 MG tablet Take 25 mg by mouth at bedtime.     Historical Provider, MD  azithromycin (ZITHROMAX) 250 MG tablet Take 1 tablet (250 mg total) by mouth daily. Take first 2 tablets together, then 1 every day until finished. 09/24/15   Willeen Niece, MD  benzonatate (TESSALON) 100 MG capsule Take 1 capsule (100 mg total)  by mouth every 8 (eight) hours. 09/24/15   Willeen Niece, MD  cephALEXin (KEFLEX) 500 MG capsule Take 1 capsule (500 mg total) by mouth 4 (four) times daily. Patient not taking: Reported on 08/26/2015 07/26/15   Melony Overly, MD  ciprofloxacin (CIPRO) 500 MG tablet Take 1 tablet (500 mg total) by mouth 2 (two) times daily. 08/24/15   Veryl Speak, MD  cyclobenzaprine (FLEXERIL) 5 MG tablet Take 1 tablet (5 mg total) by mouth every 8 (eight) hours as needed for muscle spasms. 05/14/15 05/13/16  Schuyler Amor, MD  fluconazole (DIFLUCAN) 150 MG tablet Take 1 tablet (150 mg total) by mouth once. May repeat in one week if needed 07/27/15    Lance Bosch, NP  ibuprofen (ADVIL,MOTRIN) 800 MG tablet Take 1 tablet (800 mg total) by mouth every 8 (eight) hours as needed. 06/23/15   Melynda Ripple, MD  levonorgestrel-ethinyl estradiol Thompson Caul) 0.1-20 MG-MCG tablet Take 1 tablet by mouth daily.    Historical Provider, MD  meloxicam (MOBIC) 15 MG tablet Take 1 tablet (15 mg total) by mouth daily. 08/29/15   Charline Bills Cuthriell, PA-C  methocarbamol (ROBAXIN) 500 MG tablet Take 1 tablet (500 mg total) by mouth 4 (four) times daily. 08/29/15   Charline Bills Cuthriell, PA-C  montelukast (SINGULAIR) 10 MG tablet TAKE 1 TABLET BY MOUTH DAILY 09/07/15   Lance Bosch, NP  naproxen sodium (ANAPROX DS) 550 MG tablet Take 1 tablet (550 mg total) by mouth 2 (two) times daily with a meal. 08/26/15   Konrad Felix, PA   BP 146/103 mmHg  Pulse 85  Temp(Src) 97.6 F (36.4 C) (Oral)  Resp 16  SpO2 97% Physical Exam  Constitutional: She is oriented to person, place, and time. She appears well-developed and well-nourished. No distress.  HENT:  Head: Normocephalic.  Mouth/Throat: Oropharynx is clear and moist and mucous membranes are normal.  Eyes: Conjunctivae and EOM are normal. Pupils are equal, round, and reactive to light.  Neck: Neck supple. No tracheal deviation present.  Cardiovascular: Normal rate and regular rhythm.   Pulmonary/Chest: Effort normal. No respiratory distress.  Abdominal: Soft. She exhibits no distension.  Neurological: She is alert and oriented to person, place, and time. She has normal strength. No cranial nerve deficit or sensory deficit. Coordination normal. GCS eye subscore is 4. GCS verbal subscore is 5. GCS motor subscore is 6.  Normal finger to nose testing and rapid alternating movement. Normal heel-to-shin testing.  Skin: Skin is warm and dry.  Psychiatric: She has a normal mood and affect.    ED Course  Procedures (including critical care time) Labs Review Labs Reviewed - No data to display  Imaging Review No  results found. I have personally reviewed and evaluated these images and lab results as part of my medical decision-making.   EKG Interpretation None      MDM   Final diagnoses:  Trigeminal neuralgia of left side of face  Atypical migraine    35 y.o. female presents with headache that is different than prior starting yeasterday. Not responding to supportive care measures at home. No neurologic deficits here and otherwise well appearing despite discomfort. No red flag symptoms for headache, no signs or symptoms of spontaneous ICH. Pain described as shooting in V1 distribution on left, consideration given to atypical migraine vs trigeminal neuralgia. Provided modified migraine cocktail with good relief of symptoms. Plan to follow up with PCP as needed and return precautions discussed for worsening or new concerning  symptoms.     Leo Grosser, MD 10/02/15 1800

## 2015-10-02 NOTE — ED Notes (Signed)
20 g LAC IV removed upon discharge.

## 2015-10-04 ENCOUNTER — Emergency Department (HOSPITAL_COMMUNITY)
Admission: EM | Admit: 2015-10-04 | Discharge: 2015-10-05 | Disposition: A | Discharge status: Home / Self Care | Payer: Self-pay | Attending: Emergency Medicine | Admitting: Emergency Medicine

## 2015-10-04 ENCOUNTER — Encounter (HOSPITAL_COMMUNITY): Payer: Self-pay | Admitting: Emergency Medicine

## 2015-10-04 ENCOUNTER — Encounter (HOSPITAL_COMMUNITY): Payer: Self-pay | Admitting: Nurse Practitioner

## 2015-10-04 ENCOUNTER — Ambulatory Visit (HOSPITAL_COMMUNITY)
Admission: EM | Admit: 2015-10-04 | Discharge: 2015-10-04 | Disposition: A | Discharge status: Home / Self Care | Payer: Self-pay | Attending: Family Medicine | Admitting: Family Medicine

## 2015-10-04 ENCOUNTER — Emergency Department (HOSPITAL_COMMUNITY)
Admission: EM | Admit: 2015-10-04 | Discharge: 2015-10-04 | Disposition: A | Discharge status: Home / Self Care | Payer: Self-pay | Attending: Emergency Medicine | Admitting: Emergency Medicine

## 2015-10-04 ENCOUNTER — Emergency Department (HOSPITAL_COMMUNITY): Payer: Self-pay

## 2015-10-04 DIAGNOSIS — Z793 Long term (current) use of hormonal contraceptives: Secondary | ICD-10-CM | POA: Insufficient documentation

## 2015-10-04 DIAGNOSIS — M542 Cervicalgia: Secondary | ICD-10-CM

## 2015-10-04 DIAGNOSIS — K219 Gastro-esophageal reflux disease without esophagitis: Secondary | ICD-10-CM | POA: Insufficient documentation

## 2015-10-04 DIAGNOSIS — R519 Headache, unspecified: Secondary | ICD-10-CM

## 2015-10-04 DIAGNOSIS — Z7951 Long term (current) use of inhaled steroids: Secondary | ICD-10-CM | POA: Insufficient documentation

## 2015-10-04 DIAGNOSIS — IMO0001 Reserved for inherently not codable concepts without codable children: Secondary | ICD-10-CM

## 2015-10-04 DIAGNOSIS — F419 Anxiety disorder, unspecified: Secondary | ICD-10-CM | POA: Insufficient documentation

## 2015-10-04 DIAGNOSIS — J019 Acute sinusitis, unspecified: Secondary | ICD-10-CM | POA: Insufficient documentation

## 2015-10-04 DIAGNOSIS — Z8742 Personal history of other diseases of the female genital tract: Secondary | ICD-10-CM | POA: Insufficient documentation

## 2015-10-04 DIAGNOSIS — Z79899 Other long term (current) drug therapy: Secondary | ICD-10-CM | POA: Insufficient documentation

## 2015-10-04 DIAGNOSIS — G500 Trigeminal neuralgia: Secondary | ICD-10-CM

## 2015-10-04 DIAGNOSIS — Z87442 Personal history of urinary calculi: Secondary | ICD-10-CM | POA: Insufficient documentation

## 2015-10-04 DIAGNOSIS — Z87891 Personal history of nicotine dependence: Secondary | ICD-10-CM | POA: Insufficient documentation

## 2015-10-04 DIAGNOSIS — R030 Elevated blood-pressure reading, without diagnosis of hypertension: Secondary | ICD-10-CM

## 2015-10-04 DIAGNOSIS — R51 Headache: Secondary | ICD-10-CM | POA: Insufficient documentation

## 2015-10-04 DIAGNOSIS — Z3202 Encounter for pregnancy test, result negative: Secondary | ICD-10-CM | POA: Insufficient documentation

## 2015-10-04 DIAGNOSIS — Z87440 Personal history of urinary (tract) infections: Secondary | ICD-10-CM | POA: Insufficient documentation

## 2015-10-04 DIAGNOSIS — Z8719 Personal history of other diseases of the digestive system: Secondary | ICD-10-CM | POA: Insufficient documentation

## 2015-10-04 MED ORDER — GABAPENTIN 300 MG PO CAPS: 300.0000 mg | ORAL_CAPSULE | Freq: Every day | ORAL | Status: DC

## 2015-10-04 MED ORDER — MOMETASONE FUROATE 50 MCG/ACT NA SUSP: 2.0000 | Freq: Every day | NASAL | Status: DC

## 2015-10-04 MED ORDER — CYCLOBENZAPRINE HCL 5 MG PO TABS: 5.0000 mg | ORAL_TABLET | Freq: Three times a day (TID) | ORAL | Status: DC | PRN

## 2015-10-04 NOTE — Discharge Instructions (Signed)
It was nice seeing you today. I am sorry about your symptoms, you might be having migraine headache or trigeminal neuralgia which is a nerve pain. I have given you nerve pain medication. Also use Flexeril as needed for neck pain only.Your BP is elevated today. It could be due to pain or anxiety. Please rest at home, see PCP tomorrow for BP check. If symptoms worsens go to the ED  Trigeminal Neuralgia Trigeminal neuralgia is a nerve disorder that causes attacks of severe facial pain. The attacks last from a few seconds to several minutes. They can happen for days, weeks, or months and then go away for months or years. Trigeminal neuralgia is also called tic douloureux. CAUSES This condition is caused by damage to a nerve in the face that is called the trigeminal nerve. An attack can be triggered by:  Talking.  Chewing.  Putting on makeup.  Washing your face.  Shaving your face.  Brushing your teeth.  Touching your face. RISK FACTORS This condition is more likely to develop in:  Women.  People who are 14 years of age or older. SYMPTOMS The main symptom of this condition is pain in the jaw, lips, eyes, nose, scalp, forehead, and face. The pain may be intense, stabbing, electric, or shock-like. DIAGNOSIS This condition is diagnosed with a physical exam. A CT scan or MRI may be done to rule out other conditions that can cause facial pain. TREATMENT This condition may be treated with:  Avoiding the things that trigger your attacks.  Pain medicine.  Surgery. This may be done in severe cases if other medical treatment does not provide relief. HOME CARE INSTRUCTIONS  Take over-the-counter and prescription medicines only as told by your health care provider.  If you wish to get pregnant, talk with your health care provider before you start trying to get pregnant.  Avoid the things that trigger your attacks. It may help to:  Chew on the unaffected side of your mouth.  Avoid  touching your face.  Avoid blasts of hot or cold air. SEEK MEDICAL CARE IF:  Your pain medicine is not helping.  You develop new, unexplained symptoms, such as:  Double vision.  Facial weakness.  Changes in hearing or balance.  You become pregnant. SEEK IMMEDIATE MEDICAL CARE IF:  Your pain is unbearable, and your pain medicine does not help.   This information is not intended to replace advice given to you by your health care provider. Make sure you discuss any questions you have with your health care provider.   Document Released: 06/10/2000 Document Revised: 03/04/2015 Document Reviewed: 10/06/2014 Elsevier Interactive Patient Education [employer].

## 2015-10-04 NOTE — ED Provider Notes (Signed)
CSN: [id number]     Arrival date & time 10/04/15  2235 History  By signing my name below, I, Hansel Feinstein, attest that this documentation has been prepared under the direction and in the presence of Delos Haring, PA-C. Electronically Signed: Hansel Feinstein, ED Scribe. 10/04/2015. 11:35 PM.    Chief Complaint  Patient presents with  . Headache   The history is provided by the patient. No language interpreter was used.   HPI Comments: Christina Fox is a 35 y.o. female with h/o HA who presents to the Emergency Department complaining of generalized and bilateral hand and feet paresthesias onset today. Pt also complains of left ear pain. She states she initially had frontal HA onset 3 days ago, which has now resolved, but she "does not feel like herself". Pt was evaluated in the ED 2 days ago and dx with trigeminal neuralgia and given rx for Neurontin, which she did not pick up. Pt was also evaluated in the ED and UC earlier today for the same complaint, head CT was negative. At UC she was also given a possible dx of sinusitis and given rx for nasonex, which she states did not alleviate her ear pain. She states her current symptoms are not similar to her h/o anxiety. Denies current HA. She states her main concern is that she "does not feel like herself".   Past Medical History  Diagnosis Date  . Headache(784.0)     sinus  . GERD (gastroesophageal reflux disease)   . Kidney stones     no current problem  . Anxiety   . Deviated septum 09/2011  . 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  . Complication of anesthesia     states small mouth  . Pelvic inflammatory disease   . Urinary tract infection    Past Surgical History  Procedure Laterality Date  . Polyps removed      colon  . Wisdom tooth extraction    . Nasal septoplasty w/ turbinoplasty  10/04/2011    Procedure: NASAL SEPTOPLASTY WITH TURBINATE REDUCTION;  Surgeon: Ascencion Dike, MD;  Location: [place];  Service: ENT;   Laterality: Bilateral;   Family History  Problem Relation Age of Onset  . Cancer Mother   . Hypertension Mother   . Hypertension Father   . Liver disease Father   . Cancer Brother   . Hypertension Other    Social History  Substance Use Topics  . Smoking status: Former Smoker -- 0.25 packs/day    Quit date: 08/23/2009  . Smokeless tobacco: Never Used  . Alcohol Use: No   OB History    Gravida Para Term Preterm AB TAB SAB Ectopic Multiple Living   4 4 4       4      Review of Systems  HENT: Positive for ear pain.   Neurological: Negative for headaches.       +generalized paresthesias, bilateral hand and feet paresthesias  All other systems reviewed and are negative.  Allergies  Macrobid and Sulfa antibiotics  Home Medications   Prior to Admission medications   Medication Sig Start Date End Date Taking? Authorizing Provider  acetaminophen (TYLENOL) 500 MG tablet Take 1,000 mg by mouth every 6 (six) hours as needed for moderate pain or headache.   Yes Historical Provider, MD  amitriptyline (ELAVIL) 25 MG tablet Take 50 mg by mouth at bedtime.    Yes Historical Provider, MD  levonorgestrel-ethinyl estradiol (AUBRA) 0.1-20 MG-MCG tablet Take  1 tablet by mouth daily.   Yes Historical Provider, MD  montelukast (SINGULAIR) 10 MG tablet TAKE 1 TABLET BY MOUTH DAILY Patient taking differently: TAKE 1 TABLET BY MOUTH DAILY IN MORNING 09/07/15  Yes Lance Bosch, NP  naproxen sodium (ANAPROX DS) 550 MG tablet Take 1 tablet (550 mg total) by mouth 2 (two) times daily with a meal. Patient taking differently: Take 550 mg by mouth 2 (two) times daily as needed for moderate pain.  08/26/15  Yes Konrad Felix, PA  albuterol (PROVENTIL HFA;VENTOLIN HFA) 108 (90 Base) MCG/ACT inhaler Inhale 1-2 puffs into the lungs every 6 (six) hours as needed for wheezing or shortness of breath. Patient not taking: Reported on 10/04/2015 09/24/15   Willeen Niece, MD  azithromycin (ZITHROMAX) 250 MG tablet Take  1 tablet (250 mg total) by mouth daily. Take first 2 tablets together, then 1 every day until finished. 10/05/15   Lamonte Hartt Carlota Raspberry, PA-C  benzonatate (TESSALON) 100 MG capsule Take 1 capsule (100 mg total) by mouth every 8 (eight) hours. Patient not taking: Reported on 10/04/2015 09/24/15   Willeen Niece, MD  cephALEXin (KEFLEX) 500 MG capsule Take 1 capsule (500 mg total) by mouth 4 (four) times daily. Patient not taking: Reported on 08/26/2015 07/26/15   Melony Overly, MD  ciprofloxacin (CIPRO) 500 MG tablet Take 1 tablet (500 mg total) by mouth 2 (two) times daily. Patient not taking: Reported on 10/04/2015 08/24/15   Veryl Speak, MD  cyclobenzaprine (FLEXERIL) 5 MG tablet Take 1 tablet (5 mg total) by mouth 3 (three) times daily as needed for muscle spasms. 10/04/15   Kinnie Feil, MD  fluconazole (DIFLUCAN) 150 MG tablet Take 1 tablet (150 mg total) by mouth once. May repeat in one week if needed Patient not taking: Reported on 10/04/2015 07/27/15   Lance Bosch, NP  gabapentin (NEURONTIN) 300 MG capsule Take 1 capsule (300 mg total) by mouth daily. 10/04/15   Kinnie Feil, MD  ibuprofen (ADVIL,MOTRIN) 800 MG tablet Take 1 tablet (800 mg total) by mouth every 8 (eight) hours as needed. Patient not taking: Reported on 10/04/2015 06/23/15   Melynda Ripple, MD  meloxicam (MOBIC) 15 MG tablet Take 1 tablet (15 mg total) by mouth daily. Patient not taking: Reported on 10/04/2015 08/29/15   Charline Bills Cuthriell, PA-C  methocarbamol (ROBAXIN) 500 MG tablet Take 1 tablet (500 mg total) by mouth 4 (four) times daily. Patient not taking: Reported on 10/04/2015 08/29/15   Charline Bills Cuthriell, PA-C  mometasone (NASONEX) 50 MCG/ACT nasal spray Place 2 sprays into the nose daily. 10/04/15   Kinnie Feil, MD   BP 165/104 mmHg  Pulse 98  Temp(Src) 97.8 F (36.6 C) (Oral)  Resp 18  SpO2 100% Physical Exam  Constitutional: She is oriented to person, place, and time. She appears well-developed and well-nourished.   HENT:  Head: Normocephalic and atraumatic.  Eyes: Conjunctivae are normal.  Cardiovascular: Normal rate.   Pulmonary/Chest: Effort normal. No respiratory distress.  Abdominal: She exhibits no distension.  Musculoskeletal: Normal range of motion.  Neurological: She is alert and oriented to person, place, and time.  Cranial nerves grossly intact on exam. Pt alert and oriented x 3 Upper and lower extremity strength is symmetrical and physiologic Normal muscular tone No facial droop Coordination intact, no limb ataxia,No pronator drift  Skin: Skin is warm and dry.  Psychiatric: Her behavior is normal. Her mood appears anxious. She does not exhibit a depressed mood.  Nursing note and vitals reviewed.   ED Course  Procedures (including critical care time) DIAGNOSTIC STUDIES: Oxygen Saturation is 100% on RA, normal by my interpretation.    COORDINATION OF CARE: 11:33 PM Discussed treatment plan with pt at bedside which includes lab work and pt agreed to plan.   Labs Review Labs Reviewed  COMPREHENSIVE METABOLIC PANEL - Abnormal; Notable for the following:    Potassium 3.2 (*)    Glucose, Bld 101 (*)    All other components within normal limits  CBC WITH DIFFERENTIAL/PLATELET  I-STAT BETA HCG BLOOD, ED ([facility], [facility], [facility] ONLY)    Imaging Review Ct Head Wo Contrast  10/04/2015  CLINICAL DATA:  Acute onset of left-sided headache, radiating down the left side of the face. Posterior neck pain. Initial encounter. EXAM: CT HEAD WITHOUT CONTRAST TECHNIQUE: Contiguous axial images were obtained from the base of the skull through the vertex without intravenous contrast. COMPARISON:  CT of the head performed 07/13/2014 FINDINGS: There is no evidence of acute infarction, mass lesion, or intra- or extra-axial hemorrhage on CT. The posterior fossa, including the cerebellum, brainstem and fourth ventricle, is within normal limits. The third and lateral ventricles, and basal ganglia are unremarkable in  appearance. The cerebral hemispheres are symmetric in appearance, with normal gray-white differentiation. No mass effect or midline shift is seen. There is no evidence of fracture; visualized osseous structures are unremarkable in appearance. The visualized portions of the orbits are within normal limits. The paranasal sinuses and mastoid air cells are well-aerated. No significant soft tissue abnormalities are seen. IMPRESSION: Unremarkable noncontrast CT of the head. Electronically Signed   By: Garald Balding M.D.   On: 10/04/2015 19:07   I have personally reviewed and evaluated these images and lab results as part of my medical decision-making.   EKG Interpretation None      MDM   Final diagnoses:  Anxiety disorder, unspecified anxiety disorder type  Acute sinusitis, recurrence not specified, unspecified location    Patient presents for her third visit today. She has had a thorough work-up with head CT. I added on blood work here in the ED which was unremarkable aside from mildly low potassium. She is very anxious but insistent that this isn't what is causing her to "feel weird". States that her mouth feels dry. I reassured here and advised her to follow-up with her PCP as soon as possible and to return to the ED at any point if her symptoms change, worsen or if she develops new symptoms.  I personally performed the services described in this documentation, which was scribed in my presence. The recorded information has been reviewed and is accurate.   Delos Haring, PA-C 10/05/15 [id number]  Merryl Hacker, MD 10/05/15 [phone]

## 2015-10-04 NOTE — ED Provider Notes (Signed)
CSN: [id number]     Arrival date & time 10/04/15  1327 History   First MD Initiated Contact with Patient 10/04/15 1519     Chief Complaint  Patient presents with  . Facial Pain  . Neck Pain   (Consider location/radiation/quality/duration/timing/severity/associated sxs/prior Treatment) The history is provided by the patient. No language interpreter was used.  Headache: Patient c/o left sided headache which started 3 days ago while she was driving to work. She denies any trigger. It was initially intermittent then it became persistent. She went to the ED on 10/02/15 where she was given Migraine cocktail. Her symptoms improved some and then she was d/c home. Yesterday her symptoms came back and persisted. Today at the urgent care she feels her symptoms improved some. Here headache radiates down the left side of her face, she also has some weird symptoms on the right side of her face.  She is also having pain in the back of her neck. Pain on her neck is described as aching but she can not qualify the pain on the left side of her face. Patient stated she has had migraine before but this is different, she also endorsed hx of anxiety disorder. Currently she denies chest pain, no change in vision. 3 days ago at the onset of her symptoms she had blurry vision which is now improved. No N/V. She had two loose stool since she got to the ED, but she stated this happens when she is anxious. No abdominal pain. Sinus Issue: She endorsed sinus congestion for 1 week, she has been using Sudafed with no improvement. HTN: Denies hx of HTN.  Past Medical History  Diagnosis Date  . Headache(784.0)     sinus  . GERD (gastroesophageal reflux disease)   . Kidney stones     no current problem  . Anxiety   . Deviated septum 09/2011  . 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  . Complication of anesthesia     states small mouth  . Pelvic inflammatory disease   . Urinary tract infection    Past Surgical History   Procedure Laterality Date  . Polyps removed      colon  . Wisdom tooth extraction    . Nasal septoplasty w/ turbinoplasty  10/04/2011    Procedure: NASAL SEPTOPLASTY WITH TURBINATE REDUCTION;  Surgeon: Ascencion Dike, MD;  Location: [place];  Service: ENT;  Laterality: Bilateral;   Family History  Problem Relation Age of Onset  . Cancer Mother   . Hypertension Mother   . Hypertension Father   . Liver disease Father   . Cancer Brother   . Hypertension Other    Social History  Substance Use Topics  . Smoking status: Former Smoker -- 0.25 packs/day    Quit date: 08/23/2009  . Smokeless tobacco: Never Used  . Alcohol Use: No   OB History    Gravida Para Term Preterm AB TAB SAB Ectopic Multiple Living   4 4 4       4      Review of Systems  Constitutional: Negative for fever.  Eyes: Negative for visual disturbance.  Respiratory: Negative.   Cardiovascular: Negative.   Gastrointestinal: Negative for nausea and vomiting.  Genitourinary: Negative.   Musculoskeletal:       Neck pain  Neurological: Positive for headaches. Negative for tremors, seizures, speech difficulty and weakness.  Psychiatric/Behavioral: Negative for decreased concentration. The patient is nervous/anxious.   All other systems reviewed and are negative.  Allergies  Macrobid and Sulfa antibiotics  Home Medications   Prior to Admission medications   Medication Sig Start Date End Date Taking? Authorizing Provider  acetaminophen (TYLENOL) 500 MG tablet Take 1,000 mg by mouth every 6 (six) hours as needed for moderate pain or headache.    Historical Provider, MD  acetaminophen-codeine (TYLENOL #3) 300-30 MG per tablet Take 1 tablet by mouth every 6 (six) hours as needed for moderate pain. 01/15/15   Lance Bosch, NP  albuterol (PROVENTIL HFA;VENTOLIN HFA) 108 (90 Base) MCG/ACT inhaler Inhale 1-2 puffs into the lungs every 6 (six) hours as needed for wheezing or shortness of breath. 09/24/15    Willeen Niece, MD  amitriptyline (ELAVIL) 25 MG tablet Take 25 mg by mouth at bedtime.     Historical Provider, MD  azithromycin (ZITHROMAX) 250 MG tablet Take 1 tablet (250 mg total) by mouth daily. Take first 2 tablets together, then 1 every day until finished. 09/24/15   Willeen Niece, MD  benzonatate (TESSALON) 100 MG capsule Take 1 capsule (100 mg total) by mouth every 8 (eight) hours. 09/24/15   Willeen Niece, MD  cephALEXin (KEFLEX) 500 MG capsule Take 1 capsule (500 mg total) by mouth 4 (four) times daily. Patient not taking: Reported on 08/26/2015 07/26/15   Melony Overly, MD  ciprofloxacin (CIPRO) 500 MG tablet Take 1 tablet (500 mg total) by mouth 2 (two) times daily. 08/24/15   Veryl Speak, MD  cyclobenzaprine (FLEXERIL) 5 MG tablet Take 1 tablet (5 mg total) by mouth every 8 (eight) hours as needed for muscle spasms. 05/14/15 05/13/16  Schuyler Amor, MD  fluconazole (DIFLUCAN) 150 MG tablet Take 1 tablet (150 mg total) by mouth once. May repeat in one week if needed 07/27/15   Lance Bosch, NP  ibuprofen (ADVIL,MOTRIN) 800 MG tablet Take 1 tablet (800 mg total) by mouth every 8 (eight) hours as needed. 06/23/15   Melynda Ripple, MD  levonorgestrel-ethinyl estradiol [place] Caul) 0.1-20 MG-MCG tablet Take 1 tablet by mouth daily.    Historical Provider, MD  meloxicam (MOBIC) 15 MG tablet Take 1 tablet (15 mg total) by mouth daily. 08/29/15   Charline Bills Cuthriell, PA-C  methocarbamol (ROBAXIN) 500 MG tablet Take 1 tablet (500 mg total) by mouth 4 (four) times daily. 08/29/15   Charline Bills Cuthriell, PA-C  montelukast (SINGULAIR) 10 MG tablet TAKE 1 TABLET BY MOUTH DAILY 09/07/15   Lance Bosch, NP  naproxen sodium (ANAPROX DS) 550 MG tablet Take 1 tablet (550 mg total) by mouth 2 (two) times daily with a meal. 08/26/15   Konrad Felix, PA   Meds Ordered and Administered this Visit  Medications - No data to display  BP 159/111 mmHg  Pulse 87  Temp(Src) 98.3 F (36.8 C) (Oral)  Resp 16  SpO2  100% No data found. BP 158/90  Physical Exam  Constitutional: She is oriented to person, place, and time. She appears well-developed. No distress.  HENT:  Head: Normocephalic and atraumatic.  Right Ear: Tympanic membrane, external ear and ear canal normal.  Left Ear: Tympanic membrane, external ear and ear canal normal.  Nose: Nose normal. Right sinus exhibits no maxillary sinus tenderness. Left sinus exhibits no maxillary sinus tenderness.  Mouth/Throat: Uvula is midline, oropharynx is clear and moist and mucous membranes are normal.  Cardiovascular: Normal rate, regular rhythm and normal heart sounds.   No murmur heard. Pulmonary/Chest: Effort normal and breath sounds normal. No respiratory distress. She has  no wheezes.  Abdominal: Soft. Bowel sounds are normal. She exhibits no distension and no mass. There is no tenderness.  Musculoskeletal:       Cervical back: She exhibits normal range of motion, no swelling and no deformity.  Neurological: She is alert and oriented to person, place, and time. She has normal strength and normal reflexes. She is not disoriented. She displays no tremor. No cranial nerve deficit or sensory deficit. She displays a negative Romberg sign. Coordination and gait normal. GCS eye subscore is 4. GCS verbal subscore is 5. GCS motor subscore is 6.  No facial numbness or asymmetry,no speech deficit  Nursing note and vitals reviewed.   ED Course  Procedures (including critical care time)  Labs Review Labs Reviewed - No data to display  Imaging Review No results found.   Visual Acuity Review  Right Eye Distance:   Left Eye Distance:   Bilateral Distance:    Right Eye Near:   Left Eye Near:    Bilateral Near:         MDM  No diagnosis found. Nonintractable headache, unspecified chronicity pattern, unspecified headache type  Trigeminal neuralgia  Acute sinusitis, recurrence not specified, unspecified location  Elevated blood  pressure  Anxiety  Neck pain    Symptoms atypical. No neurologic deficit. This could be Migraine headache vs s Trigeminal neuralgia. May use tylenol as needed for pain. Gabapentin prescribed for TGN. Flexeril prn muscle spasm or neck pain. Strick return precaution given. Return to the ED if symptoms worsens. She verbalized understanding.  Nasonex prescribed for sinusitis. I recommended avoiding sudaphed which can cause headache, HTN, dizziness e.t.c. She agreed with plan.  I recommended rest at home for anxiety. BP likely elevated due to headache. F/U in 1 day with PCP for BP check and anxiety.  F/U with [place] as needed.   Kinnie Feil, MD 10/04/15 1556

## 2015-10-04 NOTE — ED Notes (Signed)
The patient presented to the [facility] with a complaint of left sided face pain and neck pain. The patient stated that she was evaluated in the ED on 4/7 for a migraine and given a "migraine cocktail." She stated that her head does not hurt now but the left side of her face, neck and ears are "feeling weird and not herself." The patient had no obvious facial droop or strength deficit upon evaluation.

## 2015-10-04 NOTE — ED Notes (Signed)
Pt states that she has had L sided head and ear pain x 3 days. Seen and evaluated at [facility] tonight. Neuro intact. Alert and oriented.

## 2015-10-04 NOTE — ED Notes (Signed)
She c/o 3 day history of L sided headache, facial pain, dizziness. She was at [facility] today and was told her BP was elevated by denies hx htn. She denies fevers, cough, congestion, n/v. She is Alert and breathing easily

## 2015-10-04 NOTE — Discharge Instructions (Signed)
Please follow up with your primary care provider for further management of your condition.  Return to the ER if you have any concerns.  Sinus Headache A sinus headache occurs when the paranasal sinuses become clogged or swollen. Paranasal sinuses are air pockets within the bones of the face. Sinus headaches can range from mild to severe. CAUSES A sinus headache can result from various conditions that affect the sinuses, such as:  Colds.  Sinus infections.  Allergies. SYMPTOMS The main symptom of this condition is a headache that may feel like pain or pressure in the face, forehead, ears, or upper teeth. People who have a sinus headache often have other symptoms, such as:  Congested or runny nose.  Fever.  Inability to smell. Weather changes can make symptoms worse. DIAGNOSIS This condition may be diagnosed based on:  A physical exam and medical history.  Imaging tests, such as a CT scan and MRI, to check for problems with the sinuses.  A specialist may look into the sinuses with a tool that has a camera (endoscopy). TREATMENT Treatment for this condition depends on the cause.  Sinus pain that is caused by a sinus infection may be treated with antibiotic medicine.  Sinus pain that is caused by allergies may be helped by allergy medicines (antihistamines) and medicated nasal sprays.  Sinus pain that is caused by congestion may be helped by flushing the nose and sinuses with saline solution. HOME CARE INSTRUCTIONS  Take medicines only as directed by your health care provider.  If you were prescribed an antibiotic medicine, finish all of it even if you start to feel better.  If you have congestion, use a nasal spray to help reduce pressure.  If directed, apply a warm, moist washcloth to your face to help relieve pain. SEEK MEDICAL CARE IF:  You have headaches more than one time each week.  You have sensitivity to light or sound.  You have a fever.  You feel sick to  your stomach (nauseous) or you throw up (vomit).  Your headaches do not get better with treatment. Many people think that they have a sinus headache when they actually have migraines or tension headaches. SEEK IMMEDIATE MEDICAL CARE IF:  You have vision problems.  You have sudden, severe pain in your face or head.  You have a seizure.  You are confused.  You have a stiff neck.   This information is not intended to replace advice given to you by your health care provider. Make sure you discuss any questions you have with your health care provider.   Document Released: 07/21/2004 Document Revised: 10/28/2014 Document Reviewed: 06/09/2014 Elsevier Interactive Patient Education [employer].

## 2015-10-04 NOTE — ED Provider Notes (Signed)
CSN: [id number]     Arrival date & time 10/04/15  1606 History   First MD Initiated Contact with Patient 10/04/15 1638     Chief Complaint  Patient presents with  . Headache     (Consider location/radiation/quality/duration/timing/severity/associated sxs/prior Treatment) HPI   35 year old female with history of recurrent headache resenting to the ED with complaints of  Patient has been having left-sided headache for the past 3 days without any specific triggers. She described headaches as occasional sharp shooting pain across the left side of her head to her forehead lasting for seconds to minutes. She also endorsed facial pain, feeling dizzy, having left ear pain and "not feeling herself. She was initially seen in the ED for this complaint 3 days ago received a migraine cocktail which provide some improvement. This morning she reported headache returns, not quite as intense but she just "doesn't feel right" she went to the urgent care for her complaint and was told that she may has a sinus headache. She was prescribed nasal spray, muscle relaxant, and pain medication and was discharged however patient feels she needs further evaluation for her symptoms. She does admits to having history of migraine that this headache is different. Currently her headache is minimal. She denies having fever, double vision, hearing changes, URI symptoms, stiffness, focal numbness or weakness, or rash. Patient was told that she has high blood pressure during her visit and she was also expressing concern since she does not have any history of high blood pressure. He does not have any history of cancer, denies any abnormal weight changes, night sweats, or fever. Patient also does not exhibits photophobia or phonophobia.  Past Medical History  Diagnosis Date  . Headache(784.0)     sinus  . GERD (gastroesophageal reflux disease)   . Kidney stones     no current problem  . Anxiety   . Deviated septum 09/2011  . Nasal  turbinate hypertrophy 09/2011    bilat.  . Complication of anesthesia     states small mouth  . Pelvic inflammatory disease   . Urinary tract infection    Past Surgical History  Procedure Laterality Date  . Polyps removed      colon  . Wisdom tooth extraction    . Nasal septoplasty w/ turbinoplasty  10/04/2011    Procedure: NASAL SEPTOPLASTY WITH TURBINATE REDUCTION;  Surgeon: Ascencion Dike, MD;  Location: [facility];  Service: ENT;  Laterality: Bilateral;   Family History  Problem Relation Age of Onset  . Cancer Mother   . Hypertension Mother   . Hypertension Father   . Liver disease Father   . Cancer Brother   . Hypertension Other    Social History  Substance Use Topics  . Smoking status: Former Smoker -- 0.25 packs/day    Quit date: 08/23/2009  . Smokeless tobacco: Never Used  . Alcohol Use: No   OB History    Gravida Para Term Preterm AB TAB SAB Ectopic Multiple Living   4 4 4       4      Review of Systems  All other systems reviewed and are negative.     Allergies  Macrobid and Sulfa antibiotics  Home Medications   Prior to Admission medications   Medication Sig Start Date End Date Taking? Authorizing Provider  acetaminophen (TYLENOL) 500 MG tablet Take 1,000 mg by mouth every 6 (six) hours as needed for moderate pain or headache.    Historical Provider, MD  acetaminophen-codeine (TYLENOL #3) 300-30 MG per tablet Take 1 tablet by mouth every 6 (six) hours as needed for moderate pain. 01/15/15   Lance Bosch, NP  albuterol (PROVENTIL HFA;VENTOLIN HFA) 108 (90 Base) MCG/ACT inhaler Inhale 1-2 puffs into the lungs every 6 (six) hours as needed for wheezing or shortness of breath. 09/24/15   Willeen Niece, MD  amitriptyline (ELAVIL) 25 MG tablet Take 25 mg by mouth at bedtime.     Historical Provider, MD  azithromycin (ZITHROMAX) 250 MG tablet Take 1 tablet (250 mg total) by mouth daily. Take first 2 tablets together, then 1 every day until finished.  09/24/15   Willeen Niece, MD  benzonatate (TESSALON) 100 MG capsule Take 1 capsule (100 mg total) by mouth every 8 (eight) hours. 09/24/15   Willeen Niece, MD  cephALEXin (KEFLEX) 500 MG capsule Take 1 capsule (500 mg total) by mouth 4 (four) times daily. Patient not taking: Reported on 08/26/2015 07/26/15   Melony Overly, MD  ciprofloxacin (CIPRO) 500 MG tablet Take 1 tablet (500 mg total) by mouth 2 (two) times daily. 08/24/15   Veryl Speak, MD  cyclobenzaprine (FLEXERIL) 5 MG tablet Take 1 tablet (5 mg total) by mouth 3 (three) times daily as needed for muscle spasms. 10/04/15   Kinnie Feil, MD  fluconazole (DIFLUCAN) 150 MG tablet Take 1 tablet (150 mg total) by mouth once. May repeat in one week if needed 07/27/15   Lance Bosch, NP  gabapentin (NEURONTIN) 300 MG capsule Take 1 capsule (300 mg total) by mouth daily. 10/04/15   Kinnie Feil, MD  ibuprofen (ADVIL,MOTRIN) 800 MG tablet Take 1 tablet (800 mg total) by mouth every 8 (eight) hours as needed. 06/23/15   Melynda Ripple, MD  levonorgestrel-ethinyl estradiol Thompson Caul) 0.1-20 MG-MCG tablet Take 1 tablet by mouth daily.    Historical Provider, MD  meloxicam (MOBIC) 15 MG tablet Take 1 tablet (15 mg total) by mouth daily. 08/29/15   Charline Bills Cuthriell, PA-C  methocarbamol (ROBAXIN) 500 MG tablet Take 1 tablet (500 mg total) by mouth 4 (four) times daily. 08/29/15   Charline Bills Cuthriell, PA-C  mometasone (NASONEX) 50 MCG/ACT nasal spray Place 2 sprays into the nose daily. 10/04/15   Kinnie Feil, MD  montelukast (SINGULAIR) 10 MG tablet TAKE 1 TABLET BY MOUTH DAILY 09/07/15   Lance Bosch, NP  naproxen sodium (ANAPROX DS) 550 MG tablet Take 1 tablet (550 mg total) by mouth 2 (two) times daily with a meal. 08/26/15   Konrad Felix, PA   BP 139/88 mmHg  Pulse 93  Temp(Src) 97.9 F (36.6 C) (Oral)  Resp 17  SpO2 100% Physical Exam  Constitutional: She is oriented to person, place, and time. She appears well-developed and well-nourished.  No distress.  Well-appearing Caucasian female sitting in bed in no acute discomfort.  HENT:  Head: Atraumatic.  Right Ear: External ear normal.  Left Ear: External ear normal.  Nose: Nose normal.  Mouth/Throat: Oropharynx is clear and moist.  Eyes: Conjunctivae and EOM are normal. Pupils are equal, round, and reactive to light.  Neck: Normal range of motion. Neck supple.  No nuchal rigidity  Cardiovascular: Normal rate and regular rhythm.   Pulmonary/Chest: Effort normal and breath sounds normal.  Neurological: She is alert and oriented to person, place, and time. She has normal strength. No cranial nerve deficit or sensory deficit. She displays a negative Romberg sign. Coordination and gait normal. GCS eye subscore is  4. GCS verbal subscore is 5. GCS motor subscore is 6.  Skin: No rash noted.  Psychiatric: She has a normal mood and affect.  Nursing note and vitals reviewed.   ED Course  Procedures (including critical care time) Labs Review Labs Reviewed - No data to display  Imaging Review Ct Head Wo Contrast  10/04/2015  CLINICAL DATA:  Acute onset of left-sided headache, radiating down the left side of the face. Posterior neck pain. Initial encounter. EXAM: CT HEAD WITHOUT CONTRAST TECHNIQUE: Contiguous axial images were obtained from the base of the skull through the vertex without intravenous contrast. COMPARISON:  CT of the head performed 07/13/2014 FINDINGS: There is no evidence of acute infarction, mass lesion, or intra- or extra-axial hemorrhage on CT. The posterior fossa, including the cerebellum, brainstem and fourth ventricle, is within normal limits. The third and lateral ventricles, and basal ganglia are unremarkable in appearance. The cerebral hemispheres are symmetric in appearance, with normal gray-white differentiation. No mass effect or midline shift is seen. There is no evidence of fracture; visualized osseous structures are unremarkable in appearance. The visualized  portions of the orbits are within normal limits. The paranasal sinuses and mastoid air cells are well-aerated. No significant soft tissue abnormalities are seen. IMPRESSION: Unremarkable noncontrast CT of the head. Electronically Signed   By: Garald Balding M.D.   On: 10/04/2015 19:07   I have personally reviewed and evaluated these images and lab results as part of my medical decision-making.   EKG Interpretation None      MDM   Final diagnoses:  Bad headache    BP 139/88 mmHg  Pulse 93  Temp(Src) 97.9 F (36.6 C) (Oral)  Resp 17  SpO2 100%   5:04 PM Patient presents with left-sided headache. She has no red flags. No focal neuro deficit concerning for stroke, no fever or nuchal rigidity concerning for meningitis, no sudden onset thunderclap headache concerning for subarachnoid hemorrhage. She does voice concerns about headache therefore I will obtain a head CT scan to rule out malignancy. Otherwise patient is well appearing and her current headache is mild. On repeat vital signs, her blood pressure is 139/88, reassurance given.  7:21 PM Head CT scan is unremarkable. Patient felt reassured. She did receive medication from urgent care earlier today which I encourage patient to use. She will follow-up with her PCP for further care. Return precautions discussed.  Domenic Moras, PA-C 10/04/15 1921  Merrily Pew, MD 10/04/15 [phone]

## 2015-10-05 ENCOUNTER — Encounter (HOSPITAL_BASED_OUTPATIENT_CLINIC_OR_DEPARTMENT_OTHER): Payer: Self-pay | Admitting: *Deleted

## 2015-10-05 ENCOUNTER — Emergency Department (HOSPITAL_BASED_OUTPATIENT_CLINIC_OR_DEPARTMENT_OTHER)
Admission: EM | Admit: 2015-10-05 | Discharge: 2015-10-05 | Disposition: A | Discharge status: Home / Self Care | Payer: Self-pay | Attending: Emergency Medicine | Admitting: Emergency Medicine

## 2015-10-05 DIAGNOSIS — R631 Polydipsia: Secondary | ICD-10-CM | POA: Insufficient documentation

## 2015-10-05 DIAGNOSIS — R51 Headache: Secondary | ICD-10-CM | POA: Insufficient documentation

## 2015-10-05 DIAGNOSIS — R197 Diarrhea, unspecified: Secondary | ICD-10-CM | POA: Insufficient documentation

## 2015-10-05 DIAGNOSIS — R42 Dizziness and giddiness: Secondary | ICD-10-CM | POA: Insufficient documentation

## 2015-10-05 DIAGNOSIS — R410 Disorientation, unspecified: Secondary | ICD-10-CM | POA: Insufficient documentation

## 2015-10-05 DIAGNOSIS — Z87891 Personal history of nicotine dependence: Secondary | ICD-10-CM | POA: Insufficient documentation

## 2015-10-05 LAB — COMPREHENSIVE METABOLIC PANEL
ALBUMIN: 4.1 g/dL (ref 3.5–5.0)
ALK PHOS: 47 U/L (ref 38–126)
ALT: 32 U/L (ref 14–54)
ANION GAP: 10 (ref 5–15)
AST: 22 U/L (ref 15–41)
BUN: 8 mg/dL (ref 6–20)
CALCIUM: 8.9 mg/dL (ref 8.9–10.3)
CO2: 26 mmol/L (ref 22–32)
Chloride: 104 mmol/L (ref 101–111)
Creatinine, Ser: 0.69 mg/dL (ref 0.44–1.00)
GFR calc non Af Amer: >60 mL/min (ref >60)
GLUCOSE: 101 mg/dL — AB (ref 65–99)
POTASSIUM: 3.2 mmol/L — AB (ref 3.5–5.1)
SODIUM: 140 mmol/L (ref 135–145)
Total Bilirubin: 0.5 mg/dL (ref 0.3–1.2)
Total Protein: 7.3 g/dL (ref 6.5–8.1)

## 2015-10-05 LAB — CBC WITH DIFFERENTIAL/PLATELET
BASOS PCT: 0 %
Basophils Absolute: 0 10*3/uL (ref 0.0–0.1)
EOS ABS: 0.2 10*3/uL (ref 0.0–0.7)
EOS PCT: 2 %
HCT: 41.8 % (ref 36.0–46.0)
HEMOGLOBIN: 14 g/dL (ref 12.0–15.0)
Lymphocytes Relative: 16 %
Lymphs Abs: 1.5 10*3/uL (ref 0.7–4.0)
MCH: 28.8 pg (ref 26.0–34.0)
MCHC: 33.5 g/dL (ref 30.0–36.0)
MCV: 86 fL (ref 78.0–100.0)
MONO ABS: 1 10*3/uL (ref 0.1–1.0)
MONOS PCT: 10 %
NEUTROS PCT: 72 %
Neutro Abs: 6.7 10*3/uL (ref 1.7–7.7)
PLATELETS: 360 10*3/uL (ref 150–400)
RBC: 4.86 MIL/uL (ref 3.87–5.11)
RDW: 12.3 % (ref 11.5–15.5)
WBC: 9.4 10*3/uL (ref 4.0–10.5)

## 2015-10-05 LAB — I-STAT BETA HCG BLOOD, ED (MC, WL, AP ONLY): I-stat hCG, quantitative: <5 m[IU]/mL (ref <5)

## 2015-10-05 MED ORDER — AZITHROMYCIN 250 MG PO TABS: 250.0000 mg | ORAL_TABLET | Freq: Every day | ORAL | Status: DC

## 2015-10-05 NOTE — ED Notes (Addendum)
Dizziness, thirsty, confusion, headache x 4 days. She has been seen at [facility] and [facility] for same and had negative exams. Diarrhea x 2 days. 6 pound weight loss in 2 days. Weight is the same today as yesterday per scales.

## 2015-10-05 NOTE — ED Provider Notes (Signed)
CSN: [id number]     Arrival date & time 10/05/15  2057 History   First MD Initiated Contact with Patient 10/05/15 2204     Chief Complaint  Patient presents with  . Dizziness   HPI Pt states she has not felt well for the last few days.  She had a headache the other day that was sharp and Located the top of her head.  The episodes would come and go. Patient had been seen at an urgent care and then went to the emergency room on April 9. Patient had a CT scan that was normal. Patient denies any trouble with fevers or chills. No vomiting or diarrhea. No neck pain or neck stiffness. Headache resolved then the patient started having trouble with increased thirst, lightheadedness, diarrhea, and a sensation of just not feeling right. Patient believes her something else going on. She went to the emergency room and was seen early in the morning today. She had laboratory tests that included a CBC and a bmet. They were unremarkable exception of mildly decreased potassium. Patient was told to follow up with her primary care doctor. She cannot get in until next week. Patient was still concerned so she came back to the emergency room today Past Medical History  Diagnosis Date  . Headache(784.0)     sinus  . GERD (gastroesophageal reflux disease)   . Kidney stones     no current problem  . Anxiety   . Deviated septum 09/2011  . 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  . Complication of anesthesia     states small mouth  . Pelvic inflammatory disease   . Urinary tract infection    Past Surgical History  Procedure Laterality Date  . Polyps removed      colon  . Wisdom tooth extraction    . Nasal septoplasty w/ turbinoplasty  10/04/2011    Procedure: NASAL SEPTOPLASTY WITH TURBINATE REDUCTION;  Surgeon: Ascencion Dike, MD;  Location: [facility];  Service: ENT;  Laterality: Bilateral;   Family History  Problem Relation Age of Onset  . Cancer Mother   . Hypertension Mother   .  Hypertension Father   . Liver disease Father   . Cancer Brother   . Hypertension Other    Social History  Substance Use Topics  . Smoking status: Former Smoker -- 0.25 packs/day    Quit date: 08/23/2009  . Smokeless tobacco: Never Used  . Alcohol Use: No   OB History    Gravida Para Term Preterm AB TAB SAB Ectopic Multiple Living   4 4 4       4      Review of Systems  All other systems reviewed and are negative.     Allergies  Macrobid and Sulfa antibiotics  Home Medications   Prior to Admission medications   Medication Sig Start Date End Date Taking? Authorizing Provider  acetaminophen (TYLENOL) 500 MG tablet Take 1,000 mg by mouth every 6 (six) hours as needed for moderate pain or headache.    Historical Provider, MD  albuterol (PROVENTIL HFA;VENTOLIN HFA) 108 (90 Base) MCG/ACT inhaler Inhale 1-2 puffs into the lungs every 6 (six) hours as needed for wheezing or shortness of breath. Patient not taking: Reported on 10/04/2015 09/24/15   Willeen Niece, MD  amitriptyline (ELAVIL) 25 MG tablet Take 50 mg by mouth at bedtime.     Historical Provider, MD  azithromycin (ZITHROMAX) 250 MG tablet Take 1 tablet (250 mg total)  by mouth daily. Take first 2 tablets together, then 1 every day until finished. 10/05/15   Tiffany Carlota Raspberry, PA-C  benzonatate (TESSALON) 100 MG capsule Take 1 capsule (100 mg total) by mouth every 8 (eight) hours. Patient not taking: Reported on 10/04/2015 09/24/15   Willeen Niece, MD  cephALEXin (KEFLEX) 500 MG capsule Take 1 capsule (500 mg total) by mouth 4 (four) times daily. Patient not taking: Reported on 08/26/2015 07/26/15   Melony Overly, MD  ciprofloxacin (CIPRO) 500 MG tablet Take 1 tablet (500 mg total) by mouth 2 (two) times daily. Patient not taking: Reported on 10/04/2015 08/24/15   Veryl Speak, MD  cyclobenzaprine (FLEXERIL) 5 MG tablet Take 1 tablet (5 mg total) by mouth 3 (three) times daily as needed for muscle spasms. 10/04/15   Kinnie Feil, MD   fluconazole (DIFLUCAN) 150 MG tablet Take 1 tablet (150 mg total) by mouth once. May repeat in one week if needed Patient not taking: Reported on 10/04/2015 07/27/15   Lance Bosch, NP  gabapentin (NEURONTIN) 300 MG capsule Take 1 capsule (300 mg total) by mouth daily. 10/04/15   Kinnie Feil, MD  ibuprofen (ADVIL,MOTRIN) 800 MG tablet Take 1 tablet (800 mg total) by mouth every 8 (eight) hours as needed. Patient not taking: Reported on 10/04/2015 06/23/15   Melynda Ripple, MD  levonorgestrel-ethinyl estradiol (AUBRA) 0.1-20 MG-MCG tablet Take 1 tablet by mouth daily.    Historical Provider, MD  meloxicam (MOBIC) 15 MG tablet Take 1 tablet (15 mg total) by mouth daily. Patient not taking: Reported on 10/04/2015 08/29/15   Charline Bills Cuthriell, PA-C  methocarbamol (ROBAXIN) 500 MG tablet Take 1 tablet (500 mg total) by mouth 4 (four) times daily. Patient not taking: Reported on 10/04/2015 08/29/15   Charline Bills Cuthriell, PA-C  mometasone (NASONEX) 50 MCG/ACT nasal spray Place 2 sprays into the nose daily. 10/04/15   Kinnie Feil, MD  montelukast (SINGULAIR) 10 MG tablet TAKE 1 TABLET BY MOUTH DAILY Patient taking differently: TAKE 1 TABLET BY MOUTH DAILY IN MORNING 09/07/15   Lance Bosch, NP  naproxen sodium (ANAPROX DS) 550 MG tablet Take 1 tablet (550 mg total) by mouth 2 (two) times daily with a meal. Patient taking differently: Take 550 mg by mouth 2 (two) times daily as needed for moderate pain.  08/26/15   Konrad Felix, PA   BP 153/98 mmHg  Pulse 95  Temp(Src) 98.3 F (36.8 C) (Oral)  Resp 18  Ht 5\' 2"  (1.575 m)  Wt 88.565 kg  BMI 35.70 kg/m2  SpO2 98% Physical Exam  Constitutional: She is oriented to person, place, and time. She appears well-developed and well-nourished. No distress.  HENT:  Head: Normocephalic and atraumatic.  Right Ear: External ear normal.  Left Ear: External ear normal.  Mouth/Throat: Oropharynx is clear and moist.  Eyes: Conjunctivae are normal. Right eye  exhibits no discharge. Left eye exhibits no discharge. No scleral icterus.  Neck: Neck supple. No tracheal deviation present.  Cardiovascular: Normal rate, regular rhythm and intact distal pulses.   Pulmonary/Chest: Effort normal and breath sounds normal. No stridor. No respiratory distress. She has no wheezes. She has no rales.  Abdominal: Soft. Bowel sounds are normal. She exhibits no distension. There is no tenderness. There is no rebound and no guarding.  Musculoskeletal: She exhibits no edema or tenderness.  Neurological: She is alert and oriented to person, place, and time. She has normal strength. No cranial nerve deficit (No facial  droop, extraocular movements intact, tongue midline ) or sensory deficit. She exhibits normal muscle tone. She displays no seizure activity. Coordination normal.  No pronator drift bilateral upper extrem, able to hold both legs off bed for 5 seconds, sensation intact in all extremities, no visual field cuts, no left or right sided neglect, normal finger-nose exam bilaterally, no nystagmus noted   Skin: Skin is warm and dry. No rash noted.  Psychiatric: She has a normal mood and affect.  Nursing note and vitals reviewed.   ED Course  Procedures (including critical care time) Labs Review Labs Reviewed - No data to display  Imaging Review Ct Head Wo Contrast  10/04/2015  CLINICAL DATA:  Acute onset of left-sided headache, radiating down the left side of the face. Posterior neck pain. Initial encounter. EXAM: CT HEAD WITHOUT CONTRAST TECHNIQUE: Contiguous axial images were obtained from the base of the skull through the vertex without intravenous contrast. COMPARISON:  CT of the head performed 07/13/2014 FINDINGS: There is no evidence of acute infarction, mass lesion, or intra- or extra-axial hemorrhage on CT. The posterior fossa, including the cerebellum, brainstem and fourth ventricle, is within normal limits. The third and lateral ventricles, and basal ganglia  are unremarkable in appearance. The cerebral hemispheres are symmetric in appearance, with normal gray-white differentiation. No mass effect or midline shift is seen. There is no evidence of fracture; visualized osseous structures are unremarkable in appearance. The visualized portions of the orbits are within normal limits. The paranasal sinuses and mastoid air cells are well-aerated. No significant soft tissue abnormalities are seen. IMPRESSION: Unremarkable noncontrast CT of the head. Electronically Signed   By: Garald Balding M.D.   On: 10/04/2015 19:07   I have personally reviewed and evaluated these images and lab results as part of my medical decision-making.    MDM   Final diagnoses:  Dizziness    Patient's exam is unremarkable. Her vital signs are normal. She had laboratory tests and a CT scan within the last couple of days. Do not feel that any additional testing is indicated. Possible that her anxiety may be a cause of her symptoms. Patient is stable for discharge to follow up with her primary doctor.   Dorie Rank, MD 10/05/15 2220

## 2015-10-05 NOTE — Discharge Instructions (Signed)

## 2015-10-05 NOTE — Discharge Instructions (Signed)

## 2015-10-05 NOTE — ED Notes (Signed)
Patient was alert, oriented and stable upon discharge. RN went over AVS and patient had no further questions.  

## 2015-10-11 ENCOUNTER — Encounter (HOSPITAL_COMMUNITY): Payer: Self-pay | Admitting: Emergency Medicine

## 2015-10-11 ENCOUNTER — Emergency Department (HOSPITAL_COMMUNITY)
Admission: EM | Admit: 2015-10-11 | Discharge: 2015-10-11 | Disposition: A | Discharge status: Home / Self Care | Payer: Self-pay | Attending: Emergency Medicine | Admitting: Emergency Medicine

## 2015-10-11 DIAGNOSIS — Z87440 Personal history of urinary (tract) infections: Secondary | ICD-10-CM | POA: Insufficient documentation

## 2015-10-11 DIAGNOSIS — R42 Dizziness and giddiness: Secondary | ICD-10-CM | POA: Insufficient documentation

## 2015-10-11 DIAGNOSIS — Z3202 Encounter for pregnancy test, result negative: Secondary | ICD-10-CM | POA: Insufficient documentation

## 2015-10-11 DIAGNOSIS — Z79899 Other long term (current) drug therapy: Secondary | ICD-10-CM | POA: Insufficient documentation

## 2015-10-11 DIAGNOSIS — Z87442 Personal history of urinary calculi: Secondary | ICD-10-CM | POA: Insufficient documentation

## 2015-10-11 DIAGNOSIS — Z87891 Personal history of nicotine dependence: Secondary | ICD-10-CM | POA: Insufficient documentation

## 2015-10-11 DIAGNOSIS — F419 Anxiety disorder, unspecified: Secondary | ICD-10-CM | POA: Insufficient documentation

## 2015-10-11 DIAGNOSIS — Z8742 Personal history of other diseases of the female genital tract: Secondary | ICD-10-CM | POA: Insufficient documentation

## 2015-10-11 DIAGNOSIS — Z8719 Personal history of other diseases of the digestive system: Secondary | ICD-10-CM | POA: Insufficient documentation

## 2015-10-11 LAB — URINALYSIS, ROUTINE W REFLEX MICROSCOPIC
Bilirubin Urine: NEGATIVE
Glucose, UA: NEGATIVE mg/dL
HGB URINE DIPSTICK: NEGATIVE
KETONES UR: NEGATIVE mg/dL
LEUKOCYTES UA: NEGATIVE
Nitrite: NEGATIVE
PROTEIN: NEGATIVE mg/dL
Specific Gravity, Urine: 1.011 (ref 1.005–1.030)
pH: 7 (ref 5.0–8.0)

## 2015-10-11 LAB — I-STAT CHEM 8, ED
BUN: 6 mg/dL (ref 6–20)
CHLORIDE: 102 mmol/L (ref 101–111)
Calcium, Ion: 1.14 mmol/L (ref 1.12–1.23)
Creatinine, Ser: 0.7 mg/dL (ref 0.44–1.00)
Glucose, Bld: 97 mg/dL (ref 65–99)
HEMATOCRIT: 45 % (ref 36.0–46.0)
Hemoglobin: 15.3 g/dL — ABNORMAL HIGH (ref 12.0–15.0)
Potassium: 3.1 mmol/L — ABNORMAL LOW (ref 3.5–5.1)
SODIUM: 141 mmol/L (ref 135–145)
TCO2: 26 mmol/L (ref 0–100)

## 2015-10-11 LAB — I-STAT BETA HCG BLOOD, ED (MC, WL, AP ONLY): I-stat hCG, quantitative: <5 m[IU]/mL (ref <5)

## 2015-10-11 MED ORDER — MECLIZINE HCL 25 MG PO TABS: 25.0000 mg | ORAL_TABLET | Freq: Three times a day (TID) | ORAL | Status: DC | PRN

## 2015-10-11 MED ORDER — CETIRIZINE-PSEUDOEPHEDRINE ER 5-120 MG PO TB12: 1.0000 | ORAL_TABLET | Freq: Two times a day (BID) | ORAL | Status: DC

## 2015-10-11 NOTE — ED Provider Notes (Signed)
CSN: [id number]     Arrival date & time 10/11/15  0050 History   First MD Initiated Contact with Patient 10/11/15 0402     Chief Complaint  Patient presents with  . Weakness     (Consider location/radiation/quality/duration/timing/severity/associated sxs/prior Treatment) HPI Comments: 35 year old female with a history of esophageal reflux, anxiety, and pelvic inflammatory disease, who is recently well-known to the emergency department, presents to the emergency department today for persistent symptoms of dizziness. Patient reports feeling "cold water" in her head. She states that she sometimes notices this more with change in position. She reports that this sensation has made it difficult for her to care for her children. Symptoms have been fairly constant, but waxing and waning in severity. She was seen for similar symptoms x5 since 10/02/15. Patient has tried a Z-Pak which provided her no improvement. She has had no fever, syncope, vision loss, hearing loss, vomiting, or extremity numbness/weakness. No history of head injury or trauma.  The history is provided by the patient. No language interpreter was used.    Past Medical History  Diagnosis Date  . Headache(784.0)     sinus  . GERD (gastroesophageal reflux disease)   . Kidney stones     no current problem  . Anxiety   . Deviated septum 09/2011  . 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  . Complication of anesthesia     states small mouth  . Pelvic inflammatory disease   . Urinary tract infection    Past Surgical History  Procedure Laterality Date  . Polyps removed      colon  . Wisdom tooth extraction    . Nasal septoplasty w/ turbinoplasty  10/04/2011    Procedure: NASAL SEPTOPLASTY WITH TURBINATE REDUCTION;  Surgeon: Ascencion Dike, MD;  Location: [facility];  Service: ENT;  Laterality: Bilateral;   Family History  Problem Relation Age of Onset  . Cancer Mother   . Hypertension Mother   . Hypertension  Father   . Liver disease Father   . Cancer Brother   . Hypertension Other    Social History  Substance Use Topics  . Smoking status: Former Smoker -- 0.25 packs/day    Quit date: 08/23/2009  . Smokeless tobacco: Never Used  . Alcohol Use: No   OB History    Gravida Para Term Preterm AB TAB SAB Ectopic Multiple Living   4 4 4       4       Review of Systems  Constitutional: Negative for fever.  HENT: Negative for hearing loss.   Eyes: Negative for visual disturbance.  Gastrointestinal: Negative for vomiting.  Neurological: Positive for dizziness. Negative for syncope and weakness.  All other systems reviewed and are negative.   Allergies  Macrobid and Sulfa antibiotics  Home Medications   Prior to Admission medications   Medication Sig Start Date End Date Taking? Authorizing Provider  acetaminophen (TYLENOL) 500 MG tablet Take 1,000 mg by mouth every 6 (six) hours as needed for moderate pain or headache.   Yes Historical Provider, MD  amitriptyline (ELAVIL) 25 MG tablet Take 50 mg by mouth at bedtime.    Yes Historical Provider, MD  levonorgestrel-ethinyl estradiol (AUBRA) 0.1-20 MG-MCG tablet Take 1 tablet by mouth daily.   Yes Historical Provider, MD  montelukast (SINGULAIR) 10 MG tablet TAKE 1 TABLET BY MOUTH DAILY Patient taking differently: TAKE 1 TABLET BY MOUTH DAILY IN MORNING 09/07/15  Yes Lance Bosch, NP  naproxen sodium (  ANAPROX DS) 550 MG tablet Take 1 tablet (550 mg total) by mouth 2 (two) times daily with a meal. Patient taking differently: Take 550 mg by mouth 2 (two) times daily as needed for moderate pain.  08/26/15  Yes Konrad Felix, PA  albuterol (PROVENTIL HFA;VENTOLIN HFA) 108 (90 Base) MCG/ACT inhaler Inhale 1-2 puffs into the lungs every 6 (six) hours as needed for wheezing or shortness of breath. Patient not taking: Reported on 10/04/2015 09/24/15   Willeen Niece, MD  azithromycin (ZITHROMAX) 250 MG tablet Take 1 tablet (250 mg total) by mouth daily.  Take first 2 tablets together, then 1 every day until finished. 10/05/15   Tiffany Carlota Raspberry, PA-C  benzonatate (TESSALON) 100 MG capsule Take 1 capsule (100 mg total) by mouth every 8 (eight) hours. Patient not taking: Reported on 10/04/2015 09/24/15   Willeen Niece, MD  cephALEXin (KEFLEX) 500 MG capsule Take 1 capsule (500 mg total) by mouth 4 (four) times daily. Patient not taking: Reported on 08/26/2015 07/26/15   Melony Overly, MD  cetirizine-pseudoephedrine (ZYRTEC-D) 5-120 MG tablet Take 1 tablet by mouth 2 (two) times daily. 10/11/15   Antonietta Breach, PA-C  ciprofloxacin (CIPRO) 500 MG tablet Take 1 tablet (500 mg total) by mouth 2 (two) times daily. Patient not taking: Reported on 10/04/2015 08/24/15   Veryl Speak, MD  cyclobenzaprine (FLEXERIL) 5 MG tablet Take 1 tablet (5 mg total) by mouth 3 (three) times daily as needed for muscle spasms. Patient not taking: Reported on 10/11/2015 10/04/15   Kinnie Feil, MD  fluconazole (DIFLUCAN) 150 MG tablet Take 1 tablet (150 mg total) by mouth once. May repeat in one week if needed Patient not taking: Reported on 10/04/2015 07/27/15   Lance Bosch, NP  gabapentin (NEURONTIN) 300 MG capsule Take 1 capsule (300 mg total) by mouth daily. 10/04/15   Kinnie Feil, MD  ibuprofen (ADVIL,MOTRIN) 800 MG tablet Take 1 tablet (800 mg total) by mouth every 8 (eight) hours as needed. Patient not taking: Reported on 10/04/2015 06/23/15   Melynda Ripple, MD  meclizine (ANTIVERT) 25 MG tablet Take 1 tablet (25 mg total) by mouth 3 (three) times daily as needed for dizziness. 10/11/15   Antonietta Breach, PA-C  meloxicam (MOBIC) 15 MG tablet Take 1 tablet (15 mg total) by mouth daily. Patient not taking: Reported on 10/04/2015 08/29/15   Charline Bills Cuthriell, PA-C  methocarbamol (ROBAXIN) 500 MG tablet Take 1 tablet (500 mg total) by mouth 4 (four) times daily. Patient not taking: Reported on 10/04/2015 08/29/15   Charline Bills Cuthriell, PA-C  mometasone (NASONEX) 50 MCG/ACT nasal spray  Place 2 sprays into the nose daily. Patient not taking: Reported on 10/11/2015 10/04/15   Kinnie Feil, MD   BP 128/81 mmHg  Pulse 85  Temp(Src) 98.2 F (36.8 C) (Oral)  Resp 19  Ht 5\' 2"  (1.575 m)  Wt 86.183 kg  BMI 34.74 kg/m2  SpO2 98%   Physical Exam  Constitutional: She is oriented to person, place, and time. She appears well-developed and well-nourished. No distress.  Nontoxic/nonseptic appearing  HENT:  Head: Normocephalic and atraumatic.  Eyes: Conjunctivae and EOM are normal. No scleral icterus.  Neck: Normal range of motion.  Cardiovascular: Normal rate, regular rhythm and intact distal pulses.   Pulmonary/Chest: Effort normal. No respiratory distress. She has no wheezes.  Respirations even and unlabored  Musculoskeletal: Normal range of motion.  Neurological: She is alert and oriented to person, place, and time. No  cranial nerve deficit. She exhibits normal muscle tone. Coordination normal.  GCS 15. Speech is goal oriented. No cranial nerve deficits appreciated; symmetric eyebrow raise, no facial drooping, tongue midline. Patient has equal grip strength bilaterally with 5/5 strength against resistance in all major muscle groups bilaterally. Sensation to light touch intact. Patient ambulatory with steady gait. Finger-nose-finger intact.  Skin: Skin is warm and dry. No rash noted. She is not diaphoretic. No erythema. No pallor.  Psychiatric: She has a normal mood and affect. Her behavior is normal.  Nursing note and vitals reviewed.   ED Course  Procedures (including critical care time) Labs Review Labs Reviewed  I-STAT CHEM 8, ED - Abnormal; Notable for the following:    Potassium 3.1 (*)    Hemoglobin 15.3 (*)    All other components within normal limits  URINALYSIS, ROUTINE W REFLEX MICROSCOPIC (NOT AT [facility])  I-STAT BETA HCG BLOOD, ED ([facility], [facility], [facility] ONLY)    Imaging Review Ct Head Wo Contrast  10/04/2015 CLINICAL DATA: Acute onset of left-sided headache,  radiating down the left side of the face. Posterior neck pain. Initial encounter. EXAM: CT HEAD WITHOUT CONTRAST TECHNIQUE: Contiguous axial images were obtained from the base of the skull through the vertex without intravenous contrast. COMPARISON: CT of the head performed 07/13/2014 FINDINGS: There is no evidence of acute infarction, mass lesion, or intra- or extra-axial hemorrhage on CT. The posterior fossa, including the cerebellum, brainstem and fourth ventricle, is within normal limits. The third and lateral ventricles, and basal ganglia are unremarkable in appearance. The cerebral hemispheres are symmetric in appearance, with normal gray-white differentiation. No mass effect or midline shift is seen. There is no evidence of fracture; visualized osseous structures are unremarkable in appearance. The visualized portions of the orbits are within normal limits. The paranasal sinuses and mastoid air cells are well-aerated. No significant soft tissue abnormalities are seen. IMPRESSION: Unremarkable noncontrast CT of the head. Electronically Signed By: Garald Balding M.D. On: 10/04/2015 19:07   I have personally reviewed and evaluated these images and lab results as part of my medical decision-making.   EKG Interpretation None      MDM   Final diagnoses:  Dizziness    35 year old female presents to the emergency department for evaluation of vague symptoms of dizziness. This is her 6th visit in the past week for similar symptoms. Patient with a reassuring neurologic exam today. She has a history of a negative head CT on 10/04/2015. Patient is afebrile without nuchal rigidity or meningismus. Her laboratory workup is consistent with her baseline. Patient has been recommended to follow-up with her primary care doctor and/or a neurologist. Will attempt outpatient management with Antivert and Zyrtec-D. Doubt emergent or infectious etiology. Patient stable for discharge at this time. Return precautions  given. Patient agreeable to plan; discharged in satisfactory condition.   Filed Vitals:   10/11/15 0114 10/11/15 0503 10/11/15 0618  BP: 156/102 144/93 128/81  Pulse: 90 84 85  Temp: 98 F (36.7 C)  98.2 F (36.8 C)  TempSrc: Oral    Resp: 15 19   Height: 5\' 2"  (1.575 m)    Weight: 86.183 kg    SpO2: 100% 97% 98%       Antonietta Breach, PA-C 10/13/15 1950  Merryl Hacker, MD 10/14/15 2304

## 2015-10-11 NOTE — ED Notes (Addendum)
Pt very anxious in triage, tearful. Pt reports feeling a "watery feeling" in head, no pain but constant annoyance. Pt reports constant diarrhea with 10lb weight loss in 1 week. Pt has been seen times x 2 with normal workup. Pt states she feels like she is unable to take care of her 2 young children d/t constant weakness. Pt states she has missed work all week

## 2015-10-11 NOTE — Discharge Instructions (Signed)

## 2015-10-13 ENCOUNTER — Encounter: Payer: Self-pay | Admitting: Neurology

## 2015-10-13 ENCOUNTER — Ambulatory Visit (INDEPENDENT_AMBULATORY_CARE_PROVIDER_SITE_OTHER): Payer: Self-pay | Admitting: Neurology

## 2015-10-13 VITALS — BP 138/93 | HR 95 | Ht 62.0 in | Wt 194.5 lb

## 2015-10-13 DIAGNOSIS — R51 Headache: Secondary | ICD-10-CM

## 2015-10-13 DIAGNOSIS — R519 Headache, unspecified: Secondary | ICD-10-CM | POA: Insufficient documentation

## 2015-10-13 DIAGNOSIS — G441 Vascular headache, not elsewhere classified: Secondary | ICD-10-CM

## 2015-10-13 MED ORDER — PROPRANOLOL HCL 10 MG PO TABS: ORAL_TABLET | ORAL | Status: DC

## 2015-10-13 NOTE — Progress Notes (Signed)
Reason for visit: Headache  Referring physician: [place]  Christina Fox is a 35 y.o. female  History of present illness:  Christina Fox is a 35 year old right-handed white female with a history of onset of a left supraorbital headache that lasted about 2 days that began about 2 or 3 weeks ago. The patient has had some residual sensations of cold water running across the forehead and in the back of the head. The patient has similar sensations in the ears, and a sensation of fullness in the ear, with a slight decrease in hearing. The patient was felt initially to have a sinus infection, she was treated with azithromycin without benefit. The patient has eventually undergone a CT scan of the head that did not show any sinus disease, CT of the head was otherwise unremarkable. She indicates that she is not sleeping well, she is unable to focus at work, and she has not been working. She has been noted to have a low potassium level. CBC was unremarkable. The patient does not have any further headaches, she may have some lightheaded sensations at times. The patient has had episodes where the whole-body feels tingly at times. She denies any neck stiffness or back pain, she denies any weakness of the extremities or balance changes or difficulty controlling the bowels or the bladder. She has had some diarrhea and some weight loss of about 6 pounds over the last several weeks. She is sent to this office for further evaluation.  Past Medical History  Diagnosis Date  . Headache(784.0)     sinus  . GERD (gastroesophageal reflux disease)   . Kidney stones     no current problem  . Anxiety   . Deviated septum 09/2011  . 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  . Complication of anesthesia     states small mouth  . Pelvic inflammatory disease   . Urinary tract infection     Past Surgical History  Procedure Laterality Date  . Polyps removed      colon  . Wisdom tooth extraction    . Nasal  septoplasty w/ turbinoplasty  10/04/2011    Procedure: NASAL SEPTOPLASTY WITH TURBINATE REDUCTION;  Surgeon: Ascencion Dike, MD;  Location: [place];  Service: ENT;  Laterality: Bilateral;    Family History  Problem Relation Age of Onset  . Cancer Mother   . Hypertension Mother   . Migraines Mother   . Multiple sclerosis Mother   . Hypertension Father   . Liver disease Father   . Cancer Brother   . Hypertension Other   . Migraines Maternal Grandmother     Social history:  reports that she quit smoking about 6 years ago. She has never used smokeless tobacco. She reports that she does not drink alcohol or use illicit drugs.  Medications:  Prior to Admission medications   Medication Sig Start Date End Date Taking? Authorizing Provider  acetaminophen (TYLENOL) 500 MG tablet Take 1,000 mg by mouth every 6 (six) hours as needed for moderate pain or headache.   Yes Historical Provider, MD  amitriptyline (ELAVIL) 25 MG tablet Take 50 mg by mouth at bedtime.    Yes Historical Provider, MD  cetirizine-pseudoephedrine (ZYRTEC-D) 5-120 MG tablet Take 1 tablet by mouth 2 (two) times daily. 10/11/15  Yes Antonietta Breach, PA-C  fluticasone (FLONASE) 50 MCG/ACT nasal spray Place into both nostrils daily.   Yes Historical Provider, MD  meclizine (ANTIVERT) 25 MG tablet  Take 1 tablet (25 mg total) by mouth 3 (three) times daily as needed for dizziness. 10/11/15  Yes Antonietta Breach, PA-C  meloxicam (MOBIC) 15 MG tablet Take 1 tablet (15 mg total) by mouth daily. 08/29/15  Yes Roderic Palau D Cuthriell, PA-C  methocarbamol (ROBAXIN) 500 MG tablet Take 1 tablet (500 mg total) by mouth 4 (four) times daily. 08/29/15  Yes Roderic Palau D Cuthriell, PA-C  mometasone (NASONEX) 50 MCG/ACT nasal spray Place 2 sprays into the nose daily. 10/04/15  Yes Kinnie Feil, MD  montelukast (SINGULAIR) 10 MG tablet TAKE 1 TABLET BY MOUTH DAILY Patient taking differently: TAKE 1 TABLET BY MOUTH DAILY IN MORNING 09/07/15  Yes Lance Bosch, NP  naproxen sodium (ANAPROX DS) 550 MG tablet Take 1 tablet (550 mg total) by mouth 2 (two) times daily with a meal. Patient taking differently: Take 550 mg by mouth 2 (two) times daily as needed for moderate pain.  08/26/15  Yes Konrad Felix, PA      Allergies  Allergen Reactions  . Macrobid [Nitrofurantoin Monohyd Macro] Rash  . Sulfa Antibiotics Hives and Rash    ROS:  Out of a complete 14 system review of symptoms, the patient complains only of the following symptoms, and all other reviewed systems are negative.  Weight loss Diarrhea Increased thirst Allergies, frequent infections Confusion, headache, dizziness Anxiety, not enough sleep, disinterest in activities  Blood pressure 138/93, pulse 95, height 5\' 2"  (1.575 m), weight 194 lb 8 oz (88.225 kg).  Physical Exam  General: The patient is alert and cooperative at the time of the examination. The patient is moderately obese.  Eyes: Pupils are equal, round, and reactive to light. Discs are flat bilaterally. Good venous pulsations are seen.  Ears: Tympanic membranes are clear.  Neck: The neck is supple, no carotid bruits are noted.  Respiratory: The respiratory examination is clear.  Cardiovascular: The cardiovascular examination reveals a regular rate and rhythm, no obvious murmurs or rubs are noted.  Neuromuscular: Crepitus is noted in the right greater than left temporomandibular joint with jaw opening. Range of motion movement the cervical spine is full.  Skin: Extremities are without significant edema.  Neurologic Exam  Mental status: The patient is alert and oriented x 3 at the time of the examination. The patient has apparent normal recent and remote memory, with an apparently normal attention span and concentration ability.  Cranial nerves: Facial symmetry is present. There is good sensation of the face to pinprick and soft touch bilaterally. The strength of the facial muscles and the muscles to head  turning and shoulder shrug are normal bilaterally. Speech is well enunciated, no aphasia or dysarthria is noted. Extraocular movements are full. Visual fields are full. The tongue is midline, and the patient has symmetric elevation of the soft palate. No obvious hearing deficits are noted.  Motor: The motor testing reveals 5 over 5 strength of all 4 extremities. Good symmetric motor tone is noted throughout.  Sensory: Sensory testing is intact to pinprick, soft touch, vibration sensation, and position sense on all 4 extremities. No evidence of extinction is noted.  Coordination: Cerebellar testing reveals good finger-nose-finger and heel-to-shin bilaterally.  Gait and station: Gait is normal. Tandem gait is normal. Romberg is negative. No drift is seen.  Reflexes: Deep tendon reflexes are symmetric and normal bilaterally. Toes are downgoing bilaterally.   Assessment/Plan:  1. Headache, dizziness  The patient has a history of occasional headaches prior to the onset of this most recent  event. The patient has had some headache, dizziness sensations, but she is mainly bothered by sensation of cold water across the forehead, in the ears, and the back of the head. The etiology of this is not clear, but a CT of the brain has been completely unremarkable. We will treat for migraine, the patient is also on amitriptyline at night. She does have some issues with anxiety. I see no indication to keep this patient out of work. Some of the issues sounds as if she may have had a low-grade viral syndrome with stuffy ears, and some diarrhea and weight loss. She will follow-up in 6 weeks. The patient will be given a low dose prescription for propranolol, this may be tapered off soon if the symptoms abate.  Jill Alexanders MD 10/13/2015 7:27 PM  [facility] [address] [place], [place] [address]  Phone [phone] Fax [phone]

## 2015-10-14 ENCOUNTER — Telehealth: Payer: Self-pay | Admitting: Neurology

## 2015-10-14 MED FILL — ?MONTELUKAST SOD 10 MG TAB: 10 | 30 days supply | Qty: 30 | Fill #1

## 2015-10-14 NOTE — Telephone Encounter (Signed)
Patient is calling. She was seen in our office yesterday and was prescribed medication pranolol (INDERAL) 10 MG tablet. She states she feels like water is in her head and is very dizzy. Please call to discuss.

## 2015-10-14 NOTE — Telephone Encounter (Signed)
Returned pt's TC. Had c/o dizziness and feeling of cold water at her appt yesterday. Propanolol just initiated yesterday and can be increased after 1 week. Left pt mssg to give new med a little longer to work. She may call back w/ further questions/concerns.

## 2015-10-15 ENCOUNTER — Encounter (HOSPITAL_COMMUNITY): Payer: Self-pay

## 2015-10-15 ENCOUNTER — Emergency Department (HOSPITAL_COMMUNITY): Payer: Self-pay

## 2015-10-15 ENCOUNTER — Emergency Department (HOSPITAL_COMMUNITY)
Admission: EM | Admit: 2015-10-15 | Discharge: 2015-10-15 | Disposition: A | Discharge status: Home / Self Care | Payer: Self-pay | Attending: Emergency Medicine | Admitting: Emergency Medicine

## 2015-10-15 DIAGNOSIS — Z87891 Personal history of nicotine dependence: Secondary | ICD-10-CM | POA: Insufficient documentation

## 2015-10-15 DIAGNOSIS — Z7952 Long term (current) use of systemic steroids: Secondary | ICD-10-CM | POA: Insufficient documentation

## 2015-10-15 DIAGNOSIS — R42 Dizziness and giddiness: Secondary | ICD-10-CM | POA: Insufficient documentation

## 2015-10-15 DIAGNOSIS — F419 Anxiety disorder, unspecified: Secondary | ICD-10-CM | POA: Insufficient documentation

## 2015-10-15 DIAGNOSIS — Z791 Long term (current) use of non-steroidal anti-inflammatories (NSAID): Secondary | ICD-10-CM | POA: Insufficient documentation

## 2015-10-15 DIAGNOSIS — H9202 Otalgia, left ear: Secondary | ICD-10-CM | POA: Insufficient documentation

## 2015-10-15 DIAGNOSIS — R200 Anesthesia of skin: Secondary | ICD-10-CM | POA: Insufficient documentation

## 2015-10-15 HISTORY — DX: Migraine, unspecified, not intractable, without status migrainosus: G43.909

## 2015-10-15 LAB — CBC WITH DIFFERENTIAL/PLATELET
Basophils Absolute: 0 10*3/uL (ref 0.0–0.1)
Basophils Relative: 0 %
EOS PCT: 1 %
Eosinophils Absolute: 0.1 10*3/uL (ref 0.0–0.7)
HCT: 39.7 % (ref 36.0–46.0)
Hemoglobin: 13.8 g/dL (ref 12.0–15.0)
LYMPHS ABS: 1 10*3/uL (ref 0.7–4.0)
LYMPHS PCT: 14 %
MCH: 29.6 pg (ref 26.0–34.0)
MCHC: 34.8 g/dL (ref 30.0–36.0)
MCV: 85.2 fL (ref 78.0–100.0)
MONO ABS: 0.5 10*3/uL (ref 0.1–1.0)
Monocytes Relative: 7 %
Neutro Abs: 5.7 10*3/uL (ref 1.7–7.7)
Neutrophils Relative %: 78 %
PLATELETS: 288 10*3/uL (ref 150–400)
RBC: 4.66 MIL/uL (ref 3.87–5.11)
RDW: 12.1 % (ref 11.5–15.5)
WBC: 7.4 10*3/uL (ref 4.0–10.5)

## 2015-10-15 LAB — BASIC METABOLIC PANEL
Anion gap: 12 (ref 5–15)
BUN: 6 mg/dL (ref 6–20)
CALCIUM: 9.5 mg/dL (ref 8.9–10.3)
CO2: 24 mmol/L (ref 22–32)
CREATININE: 0.65 mg/dL (ref 0.44–1.00)
Chloride: 104 mmol/L (ref 101–111)
GFR calc Af Amer: >60 mL/min (ref >60)
GLUCOSE: 107 mg/dL — AB (ref 65–99)
POTASSIUM: 3.3 mmol/L — AB (ref 3.5–5.1)
SODIUM: 140 mmol/L (ref 135–145)

## 2015-10-15 LAB — URINALYSIS, ROUTINE W REFLEX MICROSCOPIC
BILIRUBIN URINE: NEGATIVE
GLUCOSE, UA: NEGATIVE mg/dL
HGB URINE DIPSTICK: NEGATIVE
KETONES UR: NEGATIVE mg/dL
Leukocytes, UA: NEGATIVE
Nitrite: NEGATIVE
PROTEIN: NEGATIVE mg/dL
Specific Gravity, Urine: 1.003 — ABNORMAL LOW (ref 1.005–1.030)
pH: 7 (ref 5.0–8.0)

## 2015-10-15 LAB — I-STAT TROPONIN, ED: Troponin i, poc: 0 ng/mL (ref 0.00–0.08)

## 2015-10-15 LAB — HCG, SERUM, QUALITATIVE: Preg, Serum: NEGATIVE

## 2015-10-15 NOTE — Progress Notes (Addendum)
[name] CM provided pt appt for Oct 26 2015 10 am [name]    Entered in d/c intructions [place], [place] T Go on 10/26/2015 You have a Oct 26 2015 10 am appt with Dr Janne Napoleon at [facility] [place] [address] [phone]

## 2015-10-15 NOTE — ED Provider Notes (Signed)
CSN: [id number]     Arrival date & time 10/15/15  [id number] History   First MD Initiated Contact with Patient 10/15/15 1027     Chief Complaint  Patient presents with  . Chest Pain  . Numbness     (Consider location/radiation/quality/duration/timing/severity/associated sxs/prior Treatment)  HPI 35 year old female who presents with numbness. This is her seventh emergency department visit this month. She has been seen multiple times this month for headache and dizziness, and had received a CT scan of her head that was unremarkable. She has been seen by a neurologist this month and felt that her symptoms may be consistent with that of atypical migraines and started her on migraine medications, which she has not started she was afraid of the side effects. Fits that she currently not having headaches, but feels a water sloshing sensation in her head. States that this is associated with dizziness, gait instability. This has been ongoing for several weeks. Has finished a course of antibiotics for possible sinusitis without improvement. Taking nasal decongestions, Zyrtec, and other over-the-counter medications which has not been helping. States that what concerned her today was that at around 2 AM, she woke up, and felt chest pressure over the left side of her chest. This self resolved after 20 minutes. She went back to sleep. It was not associated with syncope, near-syncope, shortness of breath, nausea or vomiting, or any other associating symptoms. Has never had pain like this before. States that then she woke up at 7 or 8 AM, and noticed that she had diminished sensation down the left arm and left leg. She has never had these symptoms before. No new vision or speech changes or weakness.  Past Medical History  Diagnosis Date  . Headache(784.0)     sinus  . GERD (gastroesophageal reflux disease)   . Kidney stones     no current problem  . Anxiety   . Deviated septum 09/2011  . Nasal turbinate hypertrophy  09/2011    bilat.  . Complication of anesthesia     states small mouth  . Pelvic inflammatory disease   . Urinary tract infection   . Migraine    Past Surgical History  Procedure Laterality Date  . Polyps removed      colon  . Wisdom tooth extraction    . Nasal septoplasty w/ turbinoplasty  10/04/2011    Procedure: NASAL SEPTOPLASTY WITH TURBINATE REDUCTION;  Surgeon: Ascencion Dike, MD;  Location: [facility];  Service: ENT;  Laterality: Bilateral;   Family History  Problem Relation Age of Onset  . Cancer Mother   . Hypertension Mother   . Migraines Mother   . Multiple sclerosis Mother   . Hypertension Father   . Liver disease Father   . Cancer Brother   . Hypertension Other   . Migraines Maternal Grandmother    Social History  Substance Use Topics  . Smoking status: Former Smoker -- 0.25 packs/day    Quit date: 08/23/2009  . Smokeless tobacco: Never Used  . Alcohol Use: No   OB History    Gravida Para Term Preterm AB TAB SAB Ectopic Multiple Living   4 4 4       4      Review of Systems 10/14 systems reviewed and are negative other than those stated in the HPI    Allergies  Macrobid and Sulfa antibiotics  Home Medications   Prior to Admission medications   Medication Sig Start Date End Date Taking? Authorizing Provider  acetaminophen (TYLENOL) 500 MG tablet Take 1,000 mg by mouth every 6 (six) hours as needed for mild pain, moderate pain, fever or headache. Reported on 10/15/2015   Yes Historical Provider, MD  amitriptyline (ELAVIL) 25 MG tablet Take 25-50 mg by mouth at bedtime.    Yes Historical Provider, MD  cetirizine-pseudoephedrine (ZYRTEC-D) 5-120 MG tablet Take 1 tablet by mouth 2 (two) times daily. 10/11/15  Yes Antonietta Breach, PA-C  fluticasone (FLONASE) 50 MCG/ACT nasal spray Place 2 sprays into both nostrils daily.    Yes Historical Provider, MD  levonorgestrel-ethinyl estradiol (AUBRA) 0.1-20 MG-MCG tablet Take 1 tablet by mouth daily.   Yes  Historical Provider, MD  meclizine (ANTIVERT) 25 MG tablet Take 1 tablet (25 mg total) by mouth 3 (three) times daily as needed for dizziness. 10/11/15  Yes Kelly Humes, PA-C  montelukast (SINGULAIR) 10 MG tablet TAKE 1 TABLET BY MOUTH DAILY Patient taking differently: TAKE 1 TABLET BY MOUTH DAILY IN MORNING 09/07/15  Yes Lance Bosch, NP  Multiple Vitamins-Minerals (ADULT GUMMY) CHEW Chew 2 each by mouth daily.   Yes Historical Provider, MD  naproxen sodium (ANAPROX DS) 550 MG tablet Take 1 tablet (550 mg total) by mouth 2 (two) times daily with a meal. Patient taking differently: Take 550 mg by mouth 2 (two) times daily as needed for moderate pain.  08/26/15  Yes Konrad Felix, PA  meloxicam (MOBIC) 15 MG tablet Take 1 tablet (15 mg total) by mouth daily. Patient not taking: Reported on 10/15/2015 08/29/15   Charline Bills Cuthriell, PA-C  methocarbamol (ROBAXIN) 500 MG tablet Take 1 tablet (500 mg total) by mouth 4 (four) times daily. Patient not taking: Reported on 10/15/2015 08/29/15   Charline Bills Cuthriell, PA-C  mometasone (NASONEX) 50 MCG/ACT nasal spray Place 2 sprays into the nose daily. Patient not taking: Reported on 10/15/2015 10/04/15   Kinnie Feil, MD  propranolol (INDERAL) 10 MG tablet One tablet twice a day for 1 week, then take 2 tablets twice a day Patient not taking: Reported on 10/15/2015 10/13/15   Kathrynn Ducking, MD   BP 134/82 mmHg  Pulse 84  Temp(Src) 98 F (36.7 C) (Oral)  Resp 16  SpO2 100% Physical Exam Physical Exam  Nursing note and vitals reviewed. Constitutional: Well developed, well nourished, non-toxic, and in no acute distress Head: Normocephalic and atraumatic.  Ears: Bilateral normal TM Mouth/Throat: Oropharynx is clear and moist.  Neck: Normal range of motion. Neck supple.  Cardiovascular: Normal rate and regular rhythm.   Pulmonary/Chest: Effort normal and breath sounds normal.  Abdominal: Soft. There is no tenderness. There is no rebound and no guarding.   Musculoskeletal: Normal range of motion.  Skin: Skin is warm and dry.  Psychiatric: Cooperative Neurological:  Alert, oriented to person, place, time, and situation. Memory grossly in tact. Fluent speech. No dysarthria or aphasia.  Cranial nerves: VF are full.Pupils are symmetric, and reactive to light. EOMI without nystagmus. No gaze deviation. Facial muscles symmetric with activation. Sensation to light touch over face in tact bilaterally. Hearing grossly in tact. Palate elevates symmetrically. Head turn and shoulder shrug are intact. Tongue midline.  Reflexes defered.  Muscle bulk and tone normal. No pronator drift. Moves all extremities symmetrically. Reports diminished sensation to light touch involving the left upper or lower extremities Coordination reveals no dysmetria with finger to nose. Gait is narrow-based and steady. Non-ataxic.   ED Course  Procedures (including critical care time) Labs Review Labs Reviewed  BASIC METABOLIC PANEL -  Abnormal; Notable for the following:    Potassium 3.3 (*)    Glucose, Bld 107 (*)    All other components within normal limits  URINALYSIS, ROUTINE W REFLEX MICROSCOPIC (NOT AT [facility]) - Abnormal; Notable for the following:    Specific Gravity, Urine 1.003 (*)    All other components within normal limits  HCG, SERUM, QUALITATIVE  CBC WITH DIFFERENTIAL/PLATELET  Randolm Idol, ED    Imaging Review Dg Chest 2 View  10/15/2015  CLINICAL DATA:  New onset mid chest pain, cough this morning. EXAM: CHEST  2 VIEW COMPARISON:  12/05/2014 FINDINGS: Normal mediastinum and cardiac silhouette. Normal pulmonary vasculature. No evidence of effusion, infiltrate, or pneumothorax. No acute bony abnormality. IMPRESSION: Normal chest radiograph Electronically Signed   By: Suzy Bouchard M.D.   On: 10/15/2015 12:10   Mr Angiogram Head Wo Contrast  10/15/2015  CLINICAL DATA:  Patient awoke with LEFT chest pain, which progressed to numbness in the LEFT leg  and foot. History of migraines. EXAM: MRI HEAD WITHOUT CONTRAST MRA HEAD WITHOUT CONTRAST TECHNIQUE: Multiplanar, multiecho pulse sequences of the brain and surrounding structures were obtained without intravenous contrast. Angiographic images of the head were obtained using MRA technique without contrast. COMPARISON:  CT head 10/04/2015. FINDINGS: MRI HEAD FINDINGS The patient was unable to remain motionless for the exam. Small or subtle lesions could be overlooked. No evidence for acute infarction, hemorrhage, mass lesion, hydrocephalus, or extra-axial fluid. Normal cerebral volume. No white matter disease. Flow voids are maintained throughout the carotid, basilar, and vertebral arteries. There are no areas of chronic hemorrhage. Pituitary, pineal, and cerebellar tonsils unremarkable. No upper cervical lesions. Visualized calvarium, skull base, and upper cervical osseous structures unremarkable. Scalp and extracranial soft tissues, orbits, sinuses, and mastoids show no acute process. MRA HEAD FINDINGS The internal carotid arteries are widely patent. The basilar artery is widely patent with vertebrals codominant. Fetal LEFT PCA. No intracranial stenosis or aneurysm. IMPRESSION: Negative MRI brain and negative MRA intracranial circulation. Electronically Signed   By: Staci Righter M.D.   On: 10/15/2015 15:11   Mr Brain Wo Contrast  10/15/2015  CLINICAL DATA:  Patient awoke with LEFT chest pain, which progressed to numbness in the LEFT leg and foot. History of migraines. EXAM: MRI HEAD WITHOUT CONTRAST MRA HEAD WITHOUT CONTRAST TECHNIQUE: Multiplanar, multiecho pulse sequences of the brain and surrounding structures were obtained without intravenous contrast. Angiographic images of the head were obtained using MRA technique without contrast. COMPARISON:  CT head 10/04/2015. FINDINGS: MRI HEAD FINDINGS The patient was unable to remain motionless for the exam. Small or subtle lesions could be overlooked. No evidence  for acute infarction, hemorrhage, mass lesion, hydrocephalus, or extra-axial fluid. Normal cerebral volume. No white matter disease. Flow voids are maintained throughout the carotid, basilar, and vertebral arteries. There are no areas of chronic hemorrhage. Pituitary, pineal, and cerebellar tonsils unremarkable. No upper cervical lesions. Visualized calvarium, skull base, and upper cervical osseous structures unremarkable. Scalp and extracranial soft tissues, orbits, sinuses, and mastoids show no acute process. MRA HEAD FINDINGS The internal carotid arteries are widely patent. The basilar artery is widely patent with vertebrals codominant. Fetal LEFT PCA. No intracranial stenosis or aneurysm. IMPRESSION: Negative MRI brain and negative MRA intracranial circulation. Electronically Signed   By: Staci Righter M.D.   On: 10/15/2015 15:11   I have personally reviewed and evaluated these images and lab results as part of my medical decision-making.   EKG Interpretation   Date/Time:  Thursday October 15 2015 09:54:44 EDT Ventricular Rate:  92 PR Interval:  130 QRS Duration: 86 QT Interval:  348 QTC Calculation: 430 R Axis:   73 Text Interpretation:  Sinus rhythm Probable left atrial enlargement  Borderline repolarization abnormality No significant change since last  tracing Confirmed by Licia Harl MD, Denica Web [id number]) on 10/15/2015 11:21:08 AM      MDM   Final diagnoses:  Left sided numbness  Otalgia of left ear  Dizziness   35 year old female who presents with new onset of left arm and left leg numbness since this morning in association with her chronic symptoms of dizziness and atypical headache. On presentation, she is nontoxic and in no acute distress. Her vital signs are within normal limits. She reports diminished sensation involving the entirety of her left arm and left leg. The remainder of her neurological exam is intact. Not suspecting an organic etiology of her symptoms, But given her new onset  numbness MRI/MRA was performed. This is negative and shows no acute processes to explain her numbness or dizziness.   In regards to her chest pain, this seems atypical. No risk factors for ACS. EKg unremarkable and troponin negative. Only 20 minutes of symptoms before self resolving, and not concerning for that PE. Even despite neuro symptoms, this is not a good history for dissection and I am not suspicious for that. CXR also shwoing no acute processes.   At this time, I do not suspect serious or toxic etiology of her symptoms. She will continue to follow-up as an outpatient with her PCP and neurologist as needed. Strict return and follow-up instructions are reviewed. She expressed understanding of all discharge instructions, and felt comfortable with the plan of care.   Forde Dandy, MD 10/15/15 [phone]

## 2015-10-15 NOTE — Progress Notes (Signed)
Referral to [facility] CM for 17 ED visits in last 6 months Last seen by Dr Feliciana Rossetti

## 2015-10-15 NOTE — ED Notes (Signed)
Patient transported to MRI 

## 2015-10-15 NOTE — ED Notes (Signed)
Patient states she woke this am at 0230 with left chest pain. Patient states she went to work and continued to have left chest pressure, but also c/o numbness to the left leg and left foot. Patient reports a history of migraines with facial numbness 2 weeks ago.

## 2015-10-15 NOTE — Progress Notes (Signed)
EPIC indicates pt last saw Dr Feliciana Rossetti at [facility] on 07/27/15     Entered in d/c instructions  [facility] on 11/25/2015 you have a 6 month follow up appt with Nurse Pratitioner [place] on 11/25/15 at 3:30 pm [place]e Woodruff [phone]

## 2015-10-15 NOTE — Hospital Discharge Follow-Up (Signed)
[facility] [place]:  This Case Manager received communication from Joellyn Quails, RN CM at [facility] ED that patient needing ED follow-up appointment. Appointment scheduled for 10/26/15 at 1000 with Dr. Janne Napoleon. Joellyn Quails, RN CM updated and appreciative.

## 2015-10-15 NOTE — Discharge Instructions (Signed)
Please follow-up closely with your primary care doctor. Continue take medications as prescribed.  Your MRI was negative for any serious causes today.    Dizziness Dizziness is a common problem. It is a feeling of unsteadiness or light-headedness. You may feel like you are about to faint. Dizziness can lead to injury if you stumble or fall. Anyone can become dizzy, but dizziness is more common in older adults. This condition can be caused by a number of things, including medicines, dehydration, or illness. HOME CARE INSTRUCTIONS Taking these steps may help with your condition: Eating and Drinking  Drink enough fluid to keep your urine clear or pale yellow. This helps to keep you from becoming dehydrated. Try to drink more clear fluids, such as water.  Do not drink alcohol.  Limit your caffeine intake if directed by your health care provider.  Limit your salt intake if directed by your health care provider. Activity  Avoid making quick movements.  Rise slowly from chairs and steady yourself until you feel okay.  In the morning, first sit up on the side of the bed. When you feel okay, stand slowly while you hold onto something until you know that your balance is fine.  Move your legs often if you need to stand in one place for a long time. Tighten and relax your muscles in your legs while you are standing.  Do not drive or operate heavy machinery if you feel dizzy.  Avoid bending down if you feel dizzy. Place items in your home so that they are easy for you to reach without leaning over. Lifestyle  Do not use any tobacco products, including cigarettes, chewing tobacco, or electronic cigarettes. If you need help quitting, ask your health care provider.  Try to reduce your stress level, such as with yoga or meditation. Talk with your health care provider if you need help. General Instructions  Watch your dizziness for any changes.  Take medicines only as directed by your health care  provider. Talk with your health care provider if you think that your dizziness is caused by a medicine that you are taking.  Tell a friend or a family member that you are feeling dizzy. If he or she notices any changes in your behavior, have this person call your health care provider.  Keep all follow-up visits as directed by your health care provider. This is important. SEEK MEDICAL CARE IF:  Your dizziness does not go away.  Your dizziness or light-headedness gets worse.  You feel nauseous.  You have reduced hearing.  You have new symptoms.  You are unsteady on your feet or you feel like the room is spinning. SEEK IMMEDIATE MEDICAL CARE IF:  You vomit or have diarrhea and are unable to eat or drink anything.  You have problems talking, walking, swallowing, or using your arms, hands, or legs.  You feel generally weak.  You are not thinking clearly or you have trouble forming sentences. It may take a friend or family member to notice this.  You have chest pain, abdominal pain, shortness of breath, or sweating.  Your vision changes.  You notice any bleeding.  You have a headache.  You have neck pain or a stiff neck.  You have a fever.   This information is not intended to replace advice given to you by your health care provider. Make sure you discuss any questions you have with your health care provider.   Document Released: 12/07/2000 Document Revised: 10/28/2014 Document Reviewed: 06/09/2014  Elsevier Interactive Patient Education ©2016 [employer] ° °

## 2015-10-16 ENCOUNTER — Emergency Department
Admission: EM | Admit: 2015-10-16 | Discharge: 2015-10-16 | Disposition: A | Discharge status: Home / Self Care | Payer: Self-pay | Attending: Emergency Medicine | Admitting: Emergency Medicine

## 2015-10-16 DIAGNOSIS — Z79899 Other long term (current) drug therapy: Secondary | ICD-10-CM | POA: Insufficient documentation

## 2015-10-16 DIAGNOSIS — E010 Iodine-deficiency related diffuse (endemic) goiter: Secondary | ICD-10-CM | POA: Insufficient documentation

## 2015-10-16 DIAGNOSIS — Z87891 Personal history of nicotine dependence: Secondary | ICD-10-CM | POA: Insufficient documentation

## 2015-10-16 DIAGNOSIS — R51 Headache: Secondary | ICD-10-CM | POA: Insufficient documentation

## 2015-10-16 DIAGNOSIS — R42 Dizziness and giddiness: Secondary | ICD-10-CM

## 2015-10-16 DIAGNOSIS — R519 Headache, unspecified: Secondary | ICD-10-CM

## 2015-10-16 LAB — POCT PREGNANCY, URINE: Preg Test, Ur: NEGATIVE

## 2015-10-16 MED ORDER — ONDANSETRON 4 MG PO TBDP: 4.0000 mg | ORAL_TABLET | Freq: Three times a day (TID) | ORAL | Status: DC | PRN

## 2015-10-16 NOTE — ED Provider Notes (Signed)
[facility] Emergency Department Provider Note  ____________________________________________  Time seen: 7:30 PM  I have reviewed the triage vital signs and the nursing notes.   HISTORY  Chief Complaint Blurred Vision; Otalgia; and Numbness    HPI Ziyonna A Schendel is a 35 y.o. female who complains of left ear stuffiness and occasional blurred vision and dizzinessover the past year, worse in the last 2 weeks. She seen neurology in the past and had an MRI/MRA of her head and neck yesterday at [facility] from the emergency department without any significant findings. She was offered migraine medications by neurology but she is not taking them. She was also recommended to take Antivert for vertigo but she doesn't take it because she feels like it makes her anxiety worse. She is scheduled to follow up with ENT next week. There've been no acute changes but she still having symptoms occasionally so she comes to the emergency department today. No vomiting. No weakness. No falls. No fever or neck stiffness.     Past Medical History  Diagnosis Date  . Headache(784.0)     sinus  . GERD (gastroesophageal reflux disease)   . Kidney stones     no current problem  . Anxiety   . Deviated septum 09/2011  . 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  . Complication of anesthesia     states small mouth  . Pelvic inflammatory disease   . Urinary tract infection   . Migraine      Patient Active Problem List   Diagnosis Date Noted  . Headache 10/13/2015  . 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 Anxiety disorder 11/03/2014     Past Surgical History  Procedure Laterality Date  . Polyps removed      colon  . Wisdom tooth extraction    . Nasal septoplasty w/ turbinoplasty  10/04/2011    Procedure: NASAL SEPTOPLASTY WITH TURBINATE REDUCTION;  Surgeon: Ascencion Dike, MD;  Location: [facility];  Service: ENT;  Laterality: Bilateral;     Current Outpatient Rx  Name   Route  Sig  Dispense  Refill  . acetaminophen (TYLENOL) 500 MG tablet   Oral   Take 1,000 mg by mouth every 6 (six) hours as needed for mild pain, moderate pain, fever or headache. Reported on 10/15/2015         . amitriptyline (ELAVIL) 25 MG tablet   Oral   Take 25-50 mg by mouth at bedtime.          . cetirizine-pseudoephedrine (ZYRTEC-D) 5-120 MG tablet   Oral   Take 1 tablet by mouth 2 (two) times daily.   20 tablet   0   . fluticasone (FLONASE) 50 MCG/ACT nasal spray   Each Nare   Place 2 sprays into both nostrils daily.          Marland Kitchen levonorgestrel-ethinyl estradiol (AUBRA) 0.1-20 MG-MCG tablet   Oral   Take 1 tablet by mouth daily.         . meclizine (ANTIVERT) 25 MG tablet   Oral   Take 1 tablet (25 mg total) by mouth 3 (three) times daily as needed for dizziness.   21 tablet   0   . meloxicam (MOBIC) 15 MG tablet   Oral   Take 1 tablet (15 mg total) by mouth daily. Patient not taking: Reported on 10/15/2015   30 tablet   0   . methocarbamol (ROBAXIN) 500 MG tablet   Oral   Take  1 tablet (500 mg total) by mouth 4 (four) times daily. Patient not taking: Reported on 10/15/2015   16 tablet   0   . mometasone (NASONEX) 50 MCG/ACT nasal spray   Nasal   Place 2 sprays into the nose daily. Patient not taking: Reported on 10/15/2015   17 g   0   . montelukast (SINGULAIR) 10 MG tablet      TAKE 1 TABLET BY MOUTH DAILY Patient taking differently: TAKE 1 TABLET BY MOUTH DAILY IN MORNING   30 tablet   3   . Multiple Vitamins-Minerals (ADULT GUMMY) CHEW   Oral   Chew 2 each by mouth daily.         . naproxen sodium (ANAPROX DS) 550 MG tablet   Oral   Take 1 tablet (550 mg total) by mouth 2 (two) times daily with a meal. Patient taking differently: Take 550 mg by mouth 2 (two) times daily as needed for moderate pain.    30 tablet   0   . ondansetron (ZOFRAN ODT) 4 MG disintegrating tablet   Oral   Take 1 tablet (4 mg total) by mouth every 8 (eight)  hours as needed for nausea or vomiting.   30 tablet   0   . propranolol (INDERAL) 10 MG tablet      One tablet twice a day for 1 week, then take 2 tablets twice a day Patient not taking: Reported on 10/15/2015   120 tablet   1      Allergies Macrobid and Sulfa antibiotics   Family History  Problem Relation Age of Onset  . Cancer Mother   . Hypertension Mother   . Migraines Mother   . Multiple sclerosis Mother   . Hypertension Father   . Liver disease Father   . Cancer Brother   . Hypertension Other   . Migraines Maternal Grandmother     Social History Social History  Substance Use Topics  . Smoking status: Former Smoker -- 0.25 packs/day    Quit date: 08/23/2009  . Smokeless tobacco: Never Used  . Alcohol Use: No    Review of Systems  Constitutional:   No fever or chills.  Eyes:   No vision changes.  ENT:   Positive rhinorrhea. Reports otorrhea of the left ear. Cardiovascular:   No chest pain. Respiratory:   No dyspnea or cough. Gastrointestinal:   Negative for abdominal pain, vomiting and diarrhea.  No bloody stool. Genitourinary:   Negative for dysuria or difficulty urinating. Musculoskeletal:   Negative for focal pain or swelling Neurological:   Negative for headaches. Occasional dizziness 10-point ROS otherwise negative.  ____________________________________________   PHYSICAL EXAM:  VITAL SIGNS: ED Triage Vitals  Enc Vitals Group     BP 10/16/15 1752 153/91 mmHg     Pulse Rate 10/16/15 1752 93     Resp 10/16/15 1752 18     Temp 10/16/15 1752 98.5 F (36.9 C)     Temp Source 10/16/15 1752 Oral     SpO2 10/16/15 1753 98 %     Weight 10/16/15 1753 192 lb (87.091 kg)     Height 10/16/15 1753 5\' 2"  (1.575 m)     Head Cir --      Peak Flow --      Pain Score --      Pain Loc --      Pain Edu? --      Excl. in [place]? --     Vital signs reviewed,  nursing assessments reviewed.   Constitutional:   Alert and oriented. Well appearing and in no  distress. Eyes:   No scleral icterus. No conjunctival pallor. PERRL. EOMI ENT   Head:   Normocephalic and atraumatic. No tenderness to percussion of the sinuses. No mastoid tenderness. TMs normal bilaterally. External canals unremarkable. No perforation or drainage. Positive clicking at the TMJ.   Nose:   Boggy turbinates, with congestion   Mouth/Throat:   MMM, no pharyngeal erythema. No peritonsillar mass.    Neck:   No stridor. No SubQ emphysema. No meningismus. Hematological/Lymphatic/Immunilogical:   No cervical lymphadenopathy. Cardiovascular:   RRR. Symmetric bilateral radial and DP pulses.  No murmurs.  Respiratory:   Normal respiratory effort without tachypnea nor retractions. Breath sounds are clear and equal bilaterally. No wheezes/rales/rhonchi. Gastrointestinal:   Soft and nontender. Non distended. There is no CVA tenderness.  No rebound, rigidity, or guarding. Genitourinary:   deferred Musculoskeletal:   Nontender with normal range of motion in all extremities. No joint effusions.  No lower extremity tenderness.  No edema. Neurologic:   Normal speech and language.  CN 2-10 normal. Motor grossly intact. No pronator drift, normal gait. No gross focal neurologic deficits are appreciated.   ____________________________________________    LABS (pertinent positives/negatives) (all labs ordered are listed, but only abnormal results are displayed) Labs Reviewed  POCT PREGNANCY, URINE   ____________________________________________   EKG    ____________________________________________    RADIOLOGY    ____________________________________________   PROCEDURES   ____________________________________________   INITIAL IMPRESSION / [place] / ED COURSE  Pertinent labs & imaging results that were available during my care of the patient were reviewed by me and considered in my medical decision making (see chart for details).  Patient well  appearing no acute distress. Presents with chronic complaints and has seen multiple providers in the past including neurology. Had an MRI yesterday which was unremarkable. Low suspicion for stroke intracranial hemorrhage or intracranial hypertension venous sinus thrombosis glaucoma temporal arteritis.  Patient appears to be at her chronic baseline. We'll discharge her home to follow up with ENT at her appointment next week. She did say that Zofran has helped for these issues in the past and so I will provide her new prescription for Zofran as this is pretty low risk. I also counseled her on possibly using ginger supplements or B vitamins.     ____________________________________________   FINAL CLINICAL IMPRESSION(S) / ED DIAGNOSES  Final diagnoses:  Headache, unspecified headache type  Dizziness       Portions of this note were generated with dragon dictation software. Dictation errors may occur despite best attempts at proofreading.   Carrie Mew, MD 10/16/15 1949

## 2015-10-16 NOTE — ED Notes (Signed)
Pt walks easily and steadily. No weakness noted.  Pt is alert and oriented.

## 2015-10-16 NOTE — ED Notes (Addendum)
Pt reports blurred vision. Left arm numbness and left ear pain.  Pt was seen yesterday at [facility] long for similar symptoms. Pt reports driving today and blurred vision. Pain has been ongoing for 1 week.  Reports that at onset of symptoms 1 week ago she has left sided head pain.

## 2015-10-16 NOTE — Discharge Instructions (Signed)
Dizziness Dizziness is a common problem. It is a feeling of unsteadiness or light-headedness. You may feel like you are about to faint. Dizziness can lead to injury if you stumble or fall. Anyone can become dizzy, but dizziness is more common in older adults. This condition can be caused by a number of things, including medicines, dehydration, or illness. HOME CARE INSTRUCTIONS Taking these steps may help with your condition: Eating and Drinking  Drink enough fluid to keep your urine clear or pale yellow. This helps to keep you from becoming dehydrated. Try to drink more clear fluids, such as water.  Do not drink alcohol.  Limit your caffeine intake if directed by your health care provider.  Limit your salt intake if directed by your health care provider. Activity  Avoid making quick movements.  Rise slowly from chairs and steady yourself until you feel okay.  In the morning, first sit up on the side of the bed. When you feel okay, stand slowly while you hold onto something until you know that your balance is fine.  Move your legs often if you need to stand in one place for a long time. Tighten and relax your muscles in your legs while you are standing.  Do not drive or operate heavy machinery if you feel dizzy.  Avoid bending down if you feel dizzy. Place items in your home so that they are easy for you to reach without leaning over. Lifestyle  Do not use any tobacco products, including cigarettes, chewing tobacco, or electronic cigarettes. If you need help quitting, ask your health care provider.  Try to reduce your stress level, such as with yoga or meditation. Talk with your health care provider if you need help. General Instructions  Watch your dizziness for any changes.  Take medicines only as directed by your health care provider. Talk with your health care provider if you think that your dizziness is caused by a medicine that you are taking.  Tell a friend or a family  member that you are feeling dizzy. If he or she notices any changes in your behavior, have this person call your health care provider.  Keep all follow-up visits as directed by your health care provider. This is important. SEEK MEDICAL CARE IF:  Your dizziness does not go away.  Your dizziness or light-headedness gets worse.  You feel nauseous.  You have reduced hearing.  You have new symptoms.  You are unsteady on your feet or you feel like the room is spinning. SEEK IMMEDIATE MEDICAL CARE IF:  You vomit or have diarrhea and are unable to eat or drink anything.  You have problems talking, walking, swallowing, or using your arms, hands, or legs.  You feel generally weak.  You are not thinking clearly or you have trouble forming sentences. It may take a friend or family member to notice this.  You have chest pain, abdominal pain, shortness of breath, or sweating.  Your vision changes.  You notice any bleeding.  You have a headache.  You have neck pain or a stiff neck.  You have a fever.   This information is not intended to replace advice given to you by your health care provider. Make sure you discuss any questions you have with your health care provider.   Document Released: 12/07/2000 Document Revised: 10/28/2014 Document Reviewed: 06/09/2014 Elsevier Interactive Patient Education 2016 [place] Headache Without Cause A headache is pain or discomfort felt around the head or neck area. The  specific cause of a headache may not be found. There are many causes and types of headaches. A few common ones are:  Tension headaches.  Migraine headaches.  Cluster headaches.  Chronic daily headaches. HOME CARE INSTRUCTIONS  Watch your condition for any changes. Take these steps to help with your condition: Managing Pain  Take over-the-counter and prescription medicines only as told by your health care provider.  Lie down in a dark, quiet room when you  have a headache.  If directed, apply ice to the head and neck area:  Put ice in a plastic bag.  Place a towel between your skin and the bag.  Leave the ice on for 20 minutes, 2-3 times per day.  Use a heating pad or hot shower to apply heat to the head and neck area as told by your health care provider.  Keep lights dim if bright lights bother you or make your headaches worse. Eating and Drinking  Eat meals on a regular schedule.  Limit alcohol use.  Decrease the amount of caffeine you drink, or stop drinking caffeine. General Instructions  Keep all follow-up visits as told by your health care provider. This is important.  Keep a headache journal to help find out what may trigger your headaches. For example, write down:  What you eat and drink.  How much sleep you get.  Any change to your diet or medicines.  Try massage or other relaxation techniques.  Limit stress.  Sit up straight, and do not tense your muscles.  Do not use tobacco products, including cigarettes, chewing tobacco, or e-cigarettes. If you need help quitting, ask your health care provider.  Exercise regularly as told by your health care provider.  Sleep on a regular schedule. Get 7-9 hours of sleep, or the amount recommended by your health care provider. SEEK MEDICAL CARE IF:   Your symptoms are not helped by medicine.  You have a headache that is different from the usual headache.  You have nausea or you vomit.  You have a fever. SEEK IMMEDIATE MEDICAL CARE IF:   Your headache becomes severe.  You have repeated vomiting.  You have a stiff neck.  You have a loss of vision.  You have problems with speech.  You have pain in the eye or ear.  You have muscular weakness or loss of muscle control.  You lose your balance or have trouble walking.  You feel faint or pass out.  You have confusion.   This information is not intended to replace advice given to you by your health care  provider. Make sure you discuss any questions you have with your health care provider.   Document Released: 06/13/2005 Document Revised: 03/04/2015 Document Reviewed: 10/06/2014 Elsevier Interactive Patient Education [employer].

## 2015-10-16 NOTE — ED Notes (Signed)
Pt reports left ear "stuffiness". Pt reports being seen at [facility] yesterday and seeing a neurologist with no answers to her concern. Pt states that she is having dizziness and blurred vision and that she was given Antivert but received no relief. Pt also reports Antivert giving her chest pain. Pt is a/o with no LOC and NAD noted at this time.

## 2015-10-20 ENCOUNTER — Ambulatory Visit (INDEPENDENT_AMBULATORY_CARE_PROVIDER_SITE_OTHER): Payer: Self-pay | Admitting: Family Medicine

## 2015-10-20 VITALS — BP 142/90 | HR 80 | Temp 98.2°F | Resp 18 | Ht 63.5 in | Wt 191.0 lb

## 2015-10-20 DIAGNOSIS — F410 Panic disorder [episodic paroxysmal anxiety] without agoraphobia: Secondary | ICD-10-CM

## 2015-10-20 DIAGNOSIS — K219 Gastro-esophageal reflux disease without esophagitis: Secondary | ICD-10-CM

## 2015-10-20 DIAGNOSIS — J0101 Acute recurrent maxillary sinusitis: Secondary | ICD-10-CM

## 2015-10-20 MED ORDER — AMOXICILLIN 875 MG PO TABS: 875.0000 mg | ORAL_TABLET | Freq: Two times a day (BID) | ORAL | Status: DC

## 2015-10-20 MED ORDER — RANITIDINE HCL 150 MG PO TABS: 150.0000 mg | ORAL_TABLET | Freq: Two times a day (BID) | ORAL | Status: DC

## 2015-10-20 MED ORDER — CLONAZEPAM 0.5 MG PO TABS: 0.5000 mg | ORAL_TABLET | Freq: Every day | ORAL | Status: DC

## 2015-10-20 NOTE — Progress Notes (Signed)
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  She now has a productive cough and symptoms of GERD.  Has had to miss work.  She is taking nasal sprays chronically which really haven't helped.  She has been going to [facility] for panic disorder.  They switched her from clonazepam which was working to [employer] which is not affording sleep.  She awakens several times a day.  Daughter had strep.  Mother is helping with child  Objective:  BP 142/90 mmHg  Pulse 80  Temp(Src) 98.2 F (36.8 C) (Oral)  Resp 18  Ht 5' 3.5" (1.613 m)  Wt 191 lb (86.637 kg)  BMI 33.30 kg/m2  SpO2 98% NAD HEENT: some cloudiness left TM, clear oroph with minimal erythema Neck: supple, no adenop Chest:  Clear Heart: reg, no murmur Skin:  Forearm tattoos Ext:  No limitation on ROM, no edema.  Assessment:  Serous otitis, panic disorder.  Plan: Acute recurrent maxillary sinusitis - Plan: amoxicillin (AMOXIL) 875 MG tablet  Panic disorder - Plan: clonazePAM (KLONOPIN) 0.5 MG tablet  Gastroesophageal reflux disease without esophagitis - Plan: ranitidine (ZANTAC) 150 MG tablet  Robyn Haber, MD

## 2015-10-26 ENCOUNTER — Inpatient Hospital Stay: Payer: Self-pay | Admitting: Internal Medicine

## 2015-10-27 ENCOUNTER — Encounter: Payer: Self-pay | Admitting: Internal Medicine

## 2015-10-27 ENCOUNTER — Ambulatory Visit: Payer: Self-pay | Attending: Internal Medicine | Admitting: Internal Medicine

## 2015-10-27 VITALS — BP 152/80 | HR 77 | Temp 98.1°F | Wt 195.8 lb

## 2015-10-27 DIAGNOSIS — I10 Essential (primary) hypertension: Secondary | ICD-10-CM | POA: Insufficient documentation

## 2015-10-27 DIAGNOSIS — E010 Iodine-deficiency related diffuse (endemic) goiter: Secondary | ICD-10-CM

## 2015-10-27 DIAGNOSIS — Z79899 Other long term (current) drug therapy: Secondary | ICD-10-CM | POA: Insufficient documentation

## 2015-10-27 DIAGNOSIS — Z87891 Personal history of nicotine dependence: Secondary | ICD-10-CM | POA: Insufficient documentation

## 2015-10-27 DIAGNOSIS — R739 Hyperglycemia, unspecified: Secondary | ICD-10-CM | POA: Insufficient documentation

## 2015-10-27 DIAGNOSIS — E049 Nontoxic goiter, unspecified: Secondary | ICD-10-CM

## 2015-10-27 DIAGNOSIS — F329 Major depressive disorder, single episode, unspecified: Secondary | ICD-10-CM | POA: Insufficient documentation

## 2015-10-27 DIAGNOSIS — K219 Gastro-esophageal reflux disease without esophagitis: Secondary | ICD-10-CM | POA: Insufficient documentation

## 2015-10-27 DIAGNOSIS — F419 Anxiety disorder, unspecified: Secondary | ICD-10-CM | POA: Insufficient documentation

## 2015-10-27 MED ORDER — PANTOPRAZOLE SODIUM 40 MG PO TBEC: 40.0000 mg | DELAYED_RELEASE_TABLET | Freq: Every day | ORAL | Status: DC

## 2015-10-27 MED ORDER — HYDROCHLOROTHIAZIDE 25 MG PO TABS: 25.0000 mg | ORAL_TABLET | Freq: Every day | ORAL | Status: DC

## 2015-10-27 MED ORDER — METFORMIN HCL 500 MG PO TABS: 500.0000 mg | ORAL_TABLET | Freq: Two times a day (BID) | ORAL | Status: DC

## 2015-10-27 NOTE — Patient Instructions (Signed)
Type 2 Diabetes Mellitus, Adult  Type 2 diabetes mellitus is a long-term (chronic) disease. In type 2 diabetes:  · The pancreas does not make enough of a hormone called insulin.  · The cells in the body do not respond as well to the insulin that is made.  · Both of the above can happen.  Normally, insulin moves sugars from food into tissue cells. This gives you energy. If you have type 2 diabetes, sugars cannot be moved into tissue cells. This causes high blood sugar (hyperglycemia).   Your doctors will set personal treatment goals for you based on your age, your medicines, how long you have had diabetes, and any other medical conditions you have. Generally, the goal of treatment is to maintain the following blood glucose levels:  · Before meals (preprandial): 80-130 mg/dL.  · After meals (postprandial): below 180 mg/dL.  · A1c: less than 6.5-7%.  HOME CARE  · Have your hemoglobin A1c level checked twice a year. The level shows if your diabetes is under control or out of control.  · Test your blood sugar level every day as told by your doctor.  · Check your ketone levels by testing your pee (urine) when you are sick and as told.  · Take your diabetes or insulin medicine as told by your doctor.    Never run out of insulin.    Adjust how much insulin you give yourself based on how many carbs (carbohydrates) you eat. Carbs are in many foods, such as fruits, vegetables, whole grains, and dairy products.  · Have a healthy snack between every healthy meal. Have 3 meals and 3 snacks a day.  · Lose weight if you are overweight.  · Carry a medical alert card or wear your medical alert jewelry.  · Carry a 15-gram carb snack with you at all times. Examples include:    Glucose pills, 3 or 4.    Glucose gel, 15-gram tube.    Raisins, 2 tablespoons (24 grams).    Jelly beans, 6.    Animal crackers, 8.    Regular (not diet) pop, 4 ounces (120 milliliters).    Gummy treats, 9.  · Notice low blood sugar (hypoglycemia) symptoms, such  as:    Shaking (tremors).    Trouble thinking clearly.    Sweating.    Faster heart rate.    Headache.    Dry mouth.    Hunger.    Crabbiness (irritability).    Being worried or tense (anxious).    Restless sleep.    A change in speech or coordination.    Confusion.  · Treat low blood sugar right away. If you are alert and can swallow, follow the 15:15 rule:    Take 15-20 grams of a rapid-acting glucose or carb. This includes glucose gel, glucose pills, or 4 ounces (120 milliliters) of fruit juice, regular pop, or low-fat milk.    Check your blood sugar level 15 minutes after taking the glucose.    Take 15-20 grams more of glucose if the repeat blood sugar level is still 70 mg/dL (milligrams/deciliter) or below.    Eat a meal or snack within 1 hour of the blood sugar levels going back to normal.  · Notice early symptoms of high blood sugar, such as:    Being really thirsty or drinking a lot (polydipsia).    Peeing a lot (polyuria).  · Do at least 150 minutes of physical activity a week or as told.      drink as usual.  Do not smoke, chew tobacco, or use electronic cigarettes.  Women who are not pregnant should drink no more than 1 drink a day. Men should drink no more than 2 drinks a day.  Only drink alcohol with food.  Ask your doctor if alcohol is safe for you.  Tell your doctor if you drink alcohol several times during the week.  See your doctor regularly.  Schedule an eye exam soon after you are told you have diabetes. Schedule exams once every year.  Check your skin and feet every day. Check for cuts, bruises, redness,  nail problems, bleeding, blisters, or sores. A doctor should do a foot exam once a year.  Brush your teeth and gums twice a day. Floss once a day. Visit your dentist regularly.  Share your diabetes plan with your workplace or school.  Keep your shots that fight diseases (vaccines) up to date.  Get a flu (influenza) shot every year.  Get a pneumonia shot. If you are 49 years of age or older and you have never gotten a pneumonia shot, you might need to get two shots.  Ask your doctor which other shots you should get.  Learn how to deal with stress.  Get diabetes education and support as needed.  Ask your doctor for special help if:  You need help to maintain or improve how you do things on your own.  You need help to maintain or improve the quality of your life.  You have foot or hand problems.  You have trouble cleaning yourself, dressing, eating, or doing physical activity. GET HELP IF:  You are unable to eat or drink for more than 6 hours.  You feel sick to your stomach (nauseous) or throw up (vomit) for more than 6 hours.  Your blood sugar level is over 240 mg/dL.  There is a change in mental status.  You get another serious illness.  You have watery poop (diarrhea) for more than 6 hours.  You have been sick or have had a fever for 2 or more days and are not getting better.  You have pain when you are active. GET HELP RIGHT AWAY IF:  You have trouble breathing.  Your ketone levels are higher than your doctor says they should be. MAKE SURE YOU:  Understand these instructions.  Will watch your condition.  Will get help right away if you are not doing well or get worse.   This information is not intended to replace advice given to you by your health care provider. Make sure you discuss any questions you have with your health care provider.   Document Released: 03/22/2008 Document Revised: 10/28/2014 Document Reviewed: 01/13/2012 Elsevier Interactive Patient  Education 2016 [employer].  - Diabetes Mellitus and Food It is important for you to manage your blood sugar (glucose) level. Your blood glucose level can be greatly affected by what you eat. Eating healthier foods in the appropriate amounts throughout the day at about the same time each day will help you control your blood glucose level. It can also help slow or prevent worsening of your diabetes mellitus. Healthy eating may even help you improve the level of your blood pressure and reach or maintain a healthy weight.  General recommendations for healthful eating and cooking habits include:  Eating meals and snacks regularly. Avoid going long periods of time without eating to lose weight.  Eating a diet that consists mainly of plant-based foods, such as fruits, vegetables, nuts, legumes,  and whole grains.  Using low-heat cooking methods, such as baking, instead of high-heat cooking methods, such as deep frying. Work with your dietitian to make sure you understand how to use the Nutrition Facts information on food labels. HOW CAN FOOD AFFECT ME? Carbohydrates Carbohydrates affect your blood glucose level more than any other type of food. Your dietitian will help you determine how many carbohydrates to eat at each meal and teach you how to count carbohydrates. Counting carbohydrates is important to keep your blood glucose at a healthy level, especially if you are using insulin or taking certain medicines for diabetes mellitus. Alcohol Alcohol can cause sudden decreases in blood glucose (hypoglycemia), especially if you use insulin or take certain medicines for diabetes mellitus. Hypoglycemia can be a life-threatening condition. Symptoms of hypoglycemia (sleepiness, dizziness, and disorientation) are similar to symptoms of having too much alcohol.  If your health care provider has given you approval to drink alcohol, do so in moderation and use the following guidelines:  Women should not have  more than one drink per day, and men should not have more than two drinks per day. One drink is equal to:  12 oz of beer.  5 oz of wine.  1 oz of hard liquor.  Do not drink on an empty stomach.  Keep yourself hydrated. Have water, diet soda, or unsweetened iced tea.  Regular soda, juice, and other mixers might contain a lot of carbohydrates and should be counted. WHAT FOODS ARE NOT RECOMMENDED? As you make food choices, it is important to remember that all foods are not the same. Some foods have fewer nutrients per serving than other foods, even though they might have the same number of calories or carbohydrates. It is difficult to get your body what it needs when you eat foods with fewer nutrients. Examples of foods that you should avoid that are high in calories and carbohydrates but low in nutrients include:  Trans fats (most processed foods list trans fats on the Nutrition Facts label).  Regular soda.  Juice.  Candy.  Sweets, such as cake, pie, doughnuts, and cookies.  Fried foods. WHAT FOODS CAN I EAT? Eat nutrient-rich foods, which will nourish your body and keep you healthy. The food you should eat also will depend on several factors, including:  The calories you need.  The medicines you take.  Your weight.  Your blood glucose level.  Your blood pressure level.  Your cholesterol level. You should eat a variety of foods, including:  Protein.  Lean cuts of meat.  Proteins low in saturated fats, such as fish, egg whites, and beans. Avoid processed meats.  Fruits and vegetables.  Fruits and vegetables that may help control blood glucose levels, such as apples, mangoes, and yams.  Dairy products.  Choose fat-free or low-fat dairy products, such as milk, yogurt, and cheese.  Grains, bread, pasta, and rice.  Choose whole grain products, such as multigrain bread, whole oats, and brown rice. These foods may help control blood pressure.  Fats.  Foods  containing healthful fats, such as nuts, avocado, olive oil, canola oil, and fish. DOES EVERYONE WITH DIABETES MELLITUS HAVE THE SAME MEAL PLAN? Because every person with diabetes mellitus is different, there is not one meal plan that works for everyone. It is very important that you meet with a dietitian who will help you create a meal plan that is just right for you.   This information is not intended to replace advice given to you by  your health care provider. Make sure you discuss any questions you have with your health care provider.   Document Released: 03/10/2005 Document Revised: 07/04/2014 Document Reviewed: 05/10/2013 Elsevier Interactive Patient Education 2016 [employer]  - Hypertension Hypertension is another name for high blood pressure. High blood pressure forces your heart to work harder to pump blood. A blood pressure reading has two numbers, which includes a higher number over a lower number (example: 110/72). HOME CARE   Have your blood pressure rechecked by your doctor.  Only take medicine as told by your doctor. Follow the directions carefully. The medicine does not work as well if you skip doses. Skipping doses also puts you at risk for problems.  Do not smoke.  Monitor your blood pressure at home as told by your doctor. GET HELP IF:  You think you are having a reaction to the medicine you are taking.  You have repeat headaches or feel dizzy.  You have puffiness (swelling) in your ankles.  You have trouble with your vision. GET HELP RIGHT AWAY IF:   You get a very bad headache and are confused.  You feel weak, numb, or faint.  You get chest or belly (abdominal) pain.  You throw up (vomit).  You cannot breathe very well. MAKE SURE YOU:   Understand these instructions.  Will watch your condition.  Will get help right away if you are not doing well or get worse.   This information is not intended to replace advice given to you by your health care  provider. Make sure you discuss any questions you have with your health care provider.   Document Released: 11/30/2007 Document Revised: 06/18/2013 Document Reviewed: 04/05/2013 Elsevier Interactive Patient Education 2016 Elsevier Inc.  -' Low-Sodium Eating Plan Sodium raises blood pressure and causes water to be held in the body. Getting less sodium from food will help lower your blood pressure, reduce any swelling, and protect your heart, liver, and kidneys. We get sodium by adding salt (sodium chloride) to food. Most of our sodium comes from canned, boxed, and frozen foods. Restaurant foods, fast foods, and pizza are also very high in sodium. Even if you take medicine to lower your blood pressure or to reduce fluid in your body, getting less sodium from your food is important. WHAT IS MY PLAN? Most people should limit their sodium intake to 2,300 mg a day. Your health care provider recommends that you limit your sodium intake to __________ a day.  WHAT DO I NEED TO KNOW ABOUT THIS EATING PLAN? For the low-sodium eating plan, you will follow these general guidelines:  Choose foods with a % Daily Value for sodium of less than 5% (as listed on the food label).   Use salt-free seasonings or herbs instead of table salt or sea salt.   Check with your health care provider or pharmacist before using salt substitutes.   Eat fresh foods.  Eat more vegetables and fruits.  Limit canned vegetables. If you do use them, rinse them well to decrease the sodium.   Limit cheese to 1 oz (28 g) per day.   Eat lower-sodium products, often labeled as "lower sodium" or "no salt added."  Avoid foods that contain monosodium glutamate (MSG). MSG is sometimes added to [place] food and some canned foods.  Check food labels (Nutrition Facts labels) on foods to learn how much sodium is in one serving.  Eat more home-cooked food and less restaurant, buffet, and fast food.  When eating at  a  restaurant, ask that your food be prepared with less salt, or no salt if possible.  HOW DO I READ FOOD LABELS FOR SODIUM INFORMATION? The Nutrition Facts label lists the amount of sodium in one serving of the food. If you eat more than one serving, you must multiply the listed amount of sodium by the number of servings. Food labels may also identify foods as:  Sodium free--Less than 5 mg in a serving.  Very low sodium--35 mg or less in a serving.  Low sodium--140 mg or less in a serving.  Light in sodium--50% less sodium in a serving. For example, if a food that usually has 300 mg of sodium is changed to become light in sodium, it will have 150 mg of sodium.  Reduced sodium--25% less sodium in a serving. For example, if a food that usually has 400 mg of sodium is changed to reduced sodium, it will have 300 mg of sodium. WHAT FOODS CAN I EAT? Grains Low-sodium cereals, including oats, puffed wheat and rice, and shredded wheat cereals. Low-sodium crackers. Unsalted rice and pasta. Lower-sodium bread.  Vegetables Frozen or fresh vegetables. Low-sodium or reduced-sodium canned vegetables. Low-sodium or reduced-sodium tomato sauce and paste. Low-sodium or reduced-sodium tomato and vegetable juices.  Fruits Fresh, frozen, and canned fruit. Fruit juice.  Meat and Other Protein Products Low-sodium canned tuna and salmon. Fresh or frozen meat, poultry, seafood, and fish. Lamb. Unsalted nuts. Dried beans, peas, and lentils without added salt. Unsalted canned beans. Homemade soups without salt. Eggs.  Dairy Milk. Soy milk. Ricotta cheese. Low-sodium or reduced-sodium cheeses. Yogurt.  Condiments Fresh and dried herbs and spices. Salt-free seasonings. Onion and garlic powders. Low-sodium varieties of mustard and ketchup. Fresh or refrigerated horseradish. Lemon juice.  Fats and Oils Reduced-sodium salad dressings. Unsalted butter.  Other Unsalted popcorn and pretzels.  The items listed  above may not be a complete list of recommended foods or beverages. Contact your dietitian for more options. WHAT FOODS ARE NOT RECOMMENDED? Grains Instant hot cereals. Bread stuffing, pancake, and biscuit mixes. Croutons. Seasoned rice or pasta mixes. Noodle soup cups. Boxed or frozen macaroni and cheese. Self-rising flour. Regular salted crackers. Vegetables Regular canned vegetables. Regular canned tomato sauce and paste. Regular tomato and vegetable juices. Frozen vegetables in sauces. Salted [place] fries. Olives. Angie Fava. Relishes. Sauerkraut. Salsa. Meat and Other Protein Products Salted, canned, smoked, spiced, or pickled meats, seafood, or fish. Bacon, ham, sausage, hot dogs, corned beef, chipped beef, and packaged luncheon meats. Salt pork. Jerky. Pickled herring. Anchovies, regular canned tuna, and sardines. Salted nuts. Dairy Processed cheese and cheese spreads. Cheese curds. Blue cheese and cottage cheese. Buttermilk.  Condiments Onion and garlic salt, seasoned salt, table salt, and sea salt. Canned and packaged gravies. Worcestershire sauce. Tartar sauce. Barbecue sauce. Teriyaki sauce. Soy sauce, including reduced sodium. Steak sauce. Fish sauce. Oyster sauce. Cocktail sauce. Horseradish that you find on the shelf. Regular ketchup and mustard. Meat flavorings and tenderizers. Bouillon cubes. Hot sauce. Tabasco sauce. Marinades. Taco seasonings. Relishes. Fats and Oils Regular salad dressings. Salted butter. Margarine. Ghee. Bacon fat.  Other Potato and tortilla chips. Corn chips and puffs. Salted popcorn and pretzels. Canned or dried soups. Pizza. Frozen entrees and pot pies.  The items listed above may not be a complete list of foods and beverages to avoid. Contact your dietitian for more information.   This information is not intended to replace advice given to you by your health care provider. Make sure you discuss any  questions you have with your health care provider.     Document Released: 12/03/2001 Document Revised: 07/04/2014 Document Reviewed: 04/17/2013 Elsevier Interactive Patient Education 2016 [employer].   - Diabetes and Exercise Exercising regularly is important. It is not just about losing weight. It has many health benefits, such as:  Improving your overall fitness, flexibility, and endurance.  Increasing your bone density.  Helping with weight control.  Decreasing your body fat.  Increasing your muscle strength.  Reducing stress and tension.  Improving your overall health. People with diabetes who exercise gain additional benefits because exercise:  Reduces appetite.  Improves the body's use of blood sugar (glucose).  Helps lower or control blood glucose.  Decreases blood pressure.  Helps control blood lipids (such as cholesterol and triglycerides).  Improves the body's use of the hormone insulin by:  Increasing the body's insulin sensitivity.  Reducing the body's insulin needs.  Decreases the risk for heart disease because exercising:  Lowers cholesterol and triglycerides levels.  Increases the levels of good cholesterol (such as high-density lipoproteins [HDL]) in the body.  Lowers blood glucose levels. YOUR ACTIVITY PLAN  Choose an activity that you enjoy, and set realistic goals. To exercise safely, you should begin practicing any new physical activity slowly, and gradually increase the intensity of the exercise over time. Your health care provider or diabetes educator can help create an activity plan that works for you. General recommendations include:  Encouraging children to engage in at least 60 minutes of physical activity each day.  Stretching and performing strength training exercises, such as yoga or weight lifting, at least 2 times per week.  Performing a total of at least 150 minutes of moderate-intensity exercise each week, such as brisk walking or water aerobics.  Exercising at least 3 days per  week, making sure you allow no more than 2 consecutive days to pass without exercising.  Avoiding long periods of inactivity (90 minutes or more). When you have to spend an extended period of time sitting down, take frequent breaks to walk or stretch. RECOMMENDATIONS FOR EXERCISING WITH TYPE 1 OR TYPE 2 DIABETES   Check your blood glucose before exercising. If blood glucose levels are greater than 240 mg/dL, check for urine ketones. Do not exercise if ketones are present.  Avoid injecting insulin into areas of the body that are going to be exercised. For example, avoid injecting insulin into:  The arms when playing tennis.  The legs when jogging.  Keep a record of:  Food intake before and after you exercise.  Expected peak times of insulin action.  Blood glucose levels before and after you exercise.  The type and amount of exercise you have done.  Review your records with your health care provider. Your health care provider will help you to develop guidelines for adjusting food intake and insulin amounts before and after exercising.  If you take insulin or oral hypoglycemic agents, watch for signs and symptoms of hypoglycemia. They include:  Dizziness.  Shaking.  Sweating.  Chills.  Confusion.  Drink plenty of water while you exercise to prevent dehydration or heat stroke. Body water is lost during exercise and must be replaced.  Talk to your health care provider before starting an exercise program to make sure it is safe for you. Remember, almost any type of activity is better than none.   This information is not intended to replace advice given to you by your health care provider. Make sure you discuss any questions you have  with your health care provider.   Document Released: 09/03/2003 Document Revised: 10/28/2014 Document Reviewed: 11/20/2012 Elsevier Interactive Patient Education [employer].

## 2015-10-27 NOTE — Progress Notes (Signed)
Christina Fox, is a 35 y.o. female  [id number]  [id number]  DOB - 05-11-1981  CC:  Chief Complaint  Patient presents with  . Hospitalization Follow-up    chest pain; numbness       HPI: Christina Fox is a 35 y.o. female here today to establish medical care, last seen in clinic 07/27/15 by NP. Since than Pt has had numerous ER visits for Left sided headache, diffuse chest pain, atypical in nature, and anxiety.  She recently saw Neurology 10/13/15 for headache, felt likely due to migraines.  MRI brain 10/15/15 negative.  She also saw ENT recently (not in [facility]), but per pt was told nothing wrong w/ her.  She subsequently went to Urgent care on 10/20/15 and found to have acute recurrent maxillary sinus and was started on amoxicillin.  She is completing that course now.  She is currently on recent trx w/ Zantac for possible GERD (as source of the CP), but she has noticed only minor improvement.  Still feels "chest tightness"  Constantly, not related to food as far as she can tell.  Also c/o of intermittent left hand /arm numbness, not certain if related to sleeping position.  Lots of anxiety, recently went to [facility] for panic d/o.  The switched her from Elavil (which wasn't helping her w/ her sleep), to Klonopin which seems to be helping her anxiety more.  Patient has No headache, No chest pain, No abdominal pain - No Nausea, No new weakness tingling or numbness, No Cough - SOB.  Allergies  Allergen Reactions  . Macrobid [Nitrofurantoin Monohyd Macro] Rash  . Sulfa Antibiotics Hives and Rash   Past Medical History  Diagnosis Date  . Headache(784.0)     sinus  . GERD (gastroesophageal reflux disease)   . Kidney stones     no current problem  . Anxiety   . Deviated septum 09/2011  . 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  . Complication of anesthesia     states small mouth  . Pelvic inflammatory disease   . Urinary tract infection   . Migraine    Current  Outpatient Prescriptions on File Prior to Visit  Medication Sig Dispense Refill  . acetaminophen (TYLENOL) 500 MG tablet Take 1,000 mg by mouth every 6 (six) hours as needed for mild pain, moderate pain, fever or headache. Reported on 10/20/2015    . amoxicillin (AMOXIL) 875 MG tablet Take 1 tablet (875 mg total) by mouth 2 (two) times daily. 20 tablet 0  . clonazePAM (KLONOPIN) 0.5 MG tablet Take 1 tablet (0.5 mg total) by mouth daily. 30 tablet 5  . fluticasone (FLONASE) 50 MCG/ACT nasal spray Place 2 sprays into both nostrils daily. Reported on 10/20/2015    . levonorgestrel-ethinyl estradiol (AUBRA) 0.1-20 MG-MCG tablet Take 1 tablet by mouth daily.    . montelukast (SINGULAIR) 10 MG tablet TAKE 1 TABLET BY MOUTH DAILY (Patient taking differently: TAKE 1 TABLET BY MOUTH DAILY IN MORNING) 30 tablet 3  . Multiple Vitamins-Minerals (ADULT GUMMY) CHEW Chew 2 each by mouth daily.    . [DISCONTINUED] escitalopram (LEXAPRO) 10 MG tablet Take 10 mg by mouth daily.      . [DISCONTINUED] ipratropium (ATROVENT) 0.06 % nasal spray Place 2 sprays into both nostrils 4 (four) times daily. (Patient not taking: Reported on 05/14/2015) 15 mL 1   No current facility-administered medications on file prior to visit.   Family History  Problem Relation Age of Onset  . Cancer Mother   .  Hypertension Mother   . Migraines Mother   . Multiple sclerosis Mother   . Hypertension Father   . Liver disease Father   . Cancer Brother   . Hypertension Other   . Migraines Maternal Grandmother    Social History   Social History  . Marital Status: Single    Spouse Name: N/A  . Number of Children: 4  . Years of Education: 11   Occupational History  . 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 Smoking status: Former Smoker -- 0.25 packs/day    Quit date: 08/23/2009  . Smokeless tobacco: Never Used  . Alcohol Use: No  . Drug Use: No  . Sexual Activity: Not Currently    Birth Control/  Protection: Pill   Other Topics Concern  . Not on file   Social History Narrative   Lives at home w/ her children   Right-handed   Drinks occasional sweet tea    Review of Systems: Constitutional: Negative for fever, chills, diaphoresis, activity change, appetite change and fatigue. Per pt, actually lost 10lbs last few months w/o trying. HENT: Negative for ear pain, nosebleeds, congestion, facial swelling, rhinorrhea, neck pain, neck stiffness and ear discharge.  Eyes: Negative for pain, discharge, redness, itching and visual disturbance. Respiratory: Negative for cough, choking, chest tightness, shortness of breath, wheezing and stridor.  Cardiovascular: Negative for chest pain, palpitations and leg swelling.  +constant chest tightness w/ possible radiation to left arm (numbness) and left lower back pain. Gastrointestinal: Negative for abdominal distention. Genitourinary: Negative for dysuria, urgency, frequency, hematuria, flank pain, decreased urine volume, difficulty urinating and dyspareunia.  Musculoskeletal: Negative for back pain, joint swelling, arthralgia and gait problem. Neurological: Negative for dizziness, tremors, seizures, syncope, facial asymmetry, speech difficulty, weakness, light-headedness, numbness and headaches.  Hematological: Negative for adenopathy. Does not bruise/bleed easily. Psychiatric/Behavioral: Negative for hallucinations, behavioral problems, confusion, dysphoric mood, decreased concentration and agitation.  No si/hi/ah/vh   Objective:   Filed Vitals:   10/27/15 1639  BP: 152/80  Pulse: 77  Temp: 98.1 F (36.7 C)    Physical Exam: Constitutional: Patient appears well-developed and well-nourished. No distress. AAOx3 HENT: Normocephalic, atraumatic, External right and left ear normal. Oropharynx is clear and moist.  Eyes: Conjunctivae and EOM are normal. PERRL, no scleral icterus. Neck: Normal ROM. Neck supple. No JVD.  No appreciable  goiter/bruit. Pulmonary: Effort and breath sounds normal, no stridor, rhonchi, wheezes, rales.  Chest pain not reproducible. Abdominal: Soft. BS +, obese, no distension, tenderness, rebound or guarding.  Musculoskeletal: Normal range of motion. No edema and no tenderness.  LE: bilat/ no c/c/e, pulses 2+ bilateral. Lymphadenopathy: No lymphadenopathy noted, cervical Neuro: Alert. muscle tone coordination. No cranial nerve deficit grossly. Skin: Skin is warm and dry. No rash noted. Not diaphoretic. No erythema. No pallor. Psychiatric: Normal mood and affect. Behavior, judgment, thought content normal.  Lab Results  Component Value Date   WBC 7.4 10/15/2015   HGB 13.8 10/15/2015   HCT 39.7 10/15/2015   MCV 85.2 10/15/2015   PLT 288 10/15/2015   Lab Results  Component Value Date   CREATININE 0.65 10/15/2015   BUN 6 10/15/2015   NA 140 10/15/2015   K 3.3* 10/15/2015   CL 104 10/15/2015   CO2 24 10/15/2015    No results found for: HGBA1C Lipid Panel  No results found for: CHOL, TRIG, HDL, CHOLHDL, VLDL, LDLCALC     Depression screen [facility] 2/9 10/27/2015 10/20/2015 01/15/2015 10/29/2014  Decreased Interest 0 0 0 0  Down, Depressed, Hopeless 0 0 0 0  PHQ - 2 Score 0 0 0 0  Altered sleeping 1 - - -  Tired, decreased energy 2 - - -  Change in appetite 0 - - -  Feeling bad or failure about yourself  0 - - -  Trouble concentrating 0 - - -  Moving slowly or fidgety/restless 0 - - -  Suicidal thoughts 0 - - -  PHQ-9 Score 3 - - -  Difficult doing work/chores Somewhat difficult - - -    Assessment and plan:   1. HTN (hypertension), benign -may be worsened w/ stressers. - start hctz 25 qd,  - low salt diet, increase exercise recd, info given.  2. Hyperglycemia - suspect either predm or metabolic syndrome given her obesity - Hemoglobin A1C - diet/exercise encouraged, low carbs recd, info given - start metformin 500bid for now.  3. Gastroesophageal reflux disease without  esophagitis ? As cause of atypical chest discomfort - zantac does not appear to be working, switch to ppi - chk hpylori stool ag - if + , trx w/ triple abx - Helicobacter pylori special antigen  4. Morbid obesity, unspecified obesity type ([place]) See above, recd increase exercise  5. Thyromegaly, ?weight loss, and recent issues w/ stress/panic attacks - eval for thyrotoxicosis., no goiter noted on exam, but per hx pt has hx of thyromegaly. - TSH - T4, free - T3, free   6. Anxiety/depression/panic do Defer to [facility].  Return in about 4 weeks (around 11/24/2015) for dm/htn/papsmear.  The patient was given clear instructions to go to ER or return to medical center if symptoms don't improve, worsen or new problems develop. The patient verbalized understanding. The patient was told to call to get lab results if they haven't heard anything in the next week.      Maren Reamer, MD, [place], [place]   10/27/2015, 5:52 PM

## 2015-10-28 LAB — TSH: TSH: 1.38 m[IU]/L

## 2015-10-28 LAB — T4, FREE: FREE T4: 1.4 ng/dL (ref 0.8–1.8)

## 2015-10-28 LAB — T3, FREE: T3 FREE: 3 pg/mL (ref 2.3–4.2)

## 2015-10-28 LAB — HEMOGLOBIN A1C
Hgb A1c MFr Bld: 5.2 % (ref <5.7)
Mean Plasma Glucose: 103 mg/dL

## 2015-10-29 LAB — HELICOBACTER PYLORI  SPECIAL ANTIGEN: H. PYLORI Antigen: NOT DETECTED

## 2015-10-31 ENCOUNTER — Emergency Department (HOSPITAL_BASED_OUTPATIENT_CLINIC_OR_DEPARTMENT_OTHER)
Admission: EM | Admit: 2015-10-31 | Discharge: 2015-10-31 | Disposition: A | Discharge status: Home / Self Care | Payer: Medicaid Other | Attending: Emergency Medicine | Admitting: Emergency Medicine

## 2015-10-31 ENCOUNTER — Encounter (HOSPITAL_BASED_OUTPATIENT_CLINIC_OR_DEPARTMENT_OTHER): Payer: Self-pay | Admitting: Emergency Medicine

## 2015-10-31 DIAGNOSIS — Z87891 Personal history of nicotine dependence: Secondary | ICD-10-CM | POA: Insufficient documentation

## 2015-10-31 DIAGNOSIS — R0789 Other chest pain: Secondary | ICD-10-CM

## 2015-10-31 LAB — TROPONIN I: Troponin I: <0.03 ng/mL (ref <0.031)

## 2015-10-31 MED ORDER — METHOCARBAMOL 500 MG PO TABS: 500.0000 mg | ORAL_TABLET | Freq: Three times a day (TID) | ORAL | Status: DC | PRN

## 2015-10-31 NOTE — ED Notes (Signed)
Patient reports that she has had chest pain over the last 2 -3 weeks, and the patient has had it checked multiple times, however today she reports that it is more into her neck and around her throat

## 2015-10-31 NOTE — Discharge Instructions (Signed)
° °  Robaxin, a muscle relaxant for your muscular chest wall pain. Continue your Protonix for reflux. Continue Klonopin for your anxiety. Follow-up with your primary care physician.     Chest Wall Pain Chest wall pain is pain in or around the bones and muscles of your chest. Sometimes, an injury causes this pain. Sometimes, the cause may not be known. This pain may take several weeks or longer to get better. HOME CARE INSTRUCTIONS  Pay attention to any changes in your symptoms. Take these actions to help with your pain:   Rest as told by your health care provider.   Avoid activities that cause pain. These include any activities that use your chest muscles or your abdominal and side muscles to lift heavy items.   If directed, apply ice to the painful area:  Put ice in a plastic bag.  Place a towel between your skin and the bag.  Leave the ice on for 20 minutes, 2-3 times per day.  Take over-the-counter and prescription medicines only as told by your health care provider.  Do not use tobacco products, including cigarettes, chewing tobacco, and e-cigarettes. If you need help quitting, ask your health care provider.  Keep all follow-up visits as told by your health care provider. This is important. SEEK MEDICAL CARE IF:  You have a fever.  Your chest pain becomes worse.  You have new symptoms. SEEK IMMEDIATE MEDICAL CARE IF:  You have nausea or vomiting.  You feel sweaty or light-headed.  You have a cough with phlegm (sputum) or you cough up blood.  You develop shortness of breath.   This information is not intended to replace advice given to you by your health care provider. Make sure you discuss any questions you have with your health care provider.   Document Released: 06/13/2005 Document Revised: 03/04/2015 Document Reviewed: 09/08/2014 Elsevier Interactive Patient Education [employer].

## 2015-10-31 NOTE — ED Provider Notes (Signed)
CSN: [id number]     Arrival date & time 10/31/15  1711 History  By signing my name below, I, [facility], attest that this documentation has been prepared under the direction and in the presence of Tanna Furry, MD. Electronically Signed: Virgel Bouquet, ED Scribe. 10/31/2015. 6:44 PM.   Chief Complaint  Patient presents with  . Chest Pain   The history is provided by the patient. No language interpreter was used.  HPI Comments: Christina Fox is a 35 y.o. female with an hx of GERD and anxiety who presents to the Emergency Department complaining of mild left-sided chest pain that radiates to the left-side of her upper onset 2 weeks ago. Pt states that the pain worsened from intermittent to constant 2 days ago and radiated into her neck yesterday. She reports a sharp HA that lasted for a few minutes yesterday. Per pt, she has been seen multiple times in the past 2 months for various complaints including sinuitis, HAs, numbness, dizziness, and anxiety, most recently in the past 2 weeks at both Urgent Care and at [facility], the latter of whom prescribed her an HTN medication and a new GERD medication. Denies hx of DM but states that she was told at her last PCP visit that she was borderline diabetic and prescribed Metformin which she has not taken. Denies neck stiffness, dyspnea.  Past Medical History  Diagnosis Date  . Headache(784.0)     sinus  . GERD (gastroesophageal reflux disease)   . Kidney stones     no current problem  . Anxiety   . Deviated septum 09/2011  . 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  . Complication of anesthesia     states small mouth  . Pelvic inflammatory disease   . Urinary tract infection   . Migraine    Past Surgical History  Procedure Laterality Date  . Polyps removed      colon  . Wisdom tooth extraction    . Nasal septoplasty w/ turbinoplasty  10/04/2011    Procedure: NASAL SEPTOPLASTY WITH TURBINATE REDUCTION;   Surgeon: Ascencion Dike, MD;  Location: [place];  Service: ENT;  Laterality: Bilateral;   Family History  Problem Relation Age of Onset  . Cancer Mother   . Hypertension Mother   . Migraines Mother   . Multiple sclerosis Mother   . Hypertension Father   . Liver disease Father   . Cancer Brother   . Hypertension Other   . Migraines Maternal Grandmother    Social History  Substance Use Topics  . Smoking status: Former Smoker -- 0.25 packs/day    Quit date: 08/23/2009  . Smokeless tobacco: Never Used  . Alcohol Use: No   OB History    Gravida Para Term Preterm AB TAB SAB Ectopic Multiple Living   4 4 4       4      Review of Systems  Constitutional: Negative for fever, chills, diaphoresis, appetite change and fatigue.  HENT: Negative for mouth sores, sore throat and trouble swallowing.   Eyes: Negative for visual disturbance.  Respiratory: Negative for cough, chest tightness, shortness of breath and wheezing.   Cardiovascular: Positive for chest pain.  Gastrointestinal: Negative for nausea, vomiting, abdominal pain, diarrhea and abdominal distention.  Endocrine: Negative for polydipsia, polyphagia and polyuria.  Genitourinary: Negative for dysuria, frequency and hematuria.  Musculoskeletal: Negative for gait problem and neck stiffness.  Skin: Negative for color change, pallor and rash.  Neurological: Positive for headaches (resolved). Negative for dizziness, syncope and light-headedness.  Hematological: Does not bruise/bleed easily.  Psychiatric/Behavioral: Negative for behavioral problems and confusion.    Allergies  Macrobid and Sulfa antibiotics  Home Medications   Prior to Admission medications   Medication Sig Start Date End Date Taking? Authorizing Provider  acetaminophen (TYLENOL) 500 MG tablet Take 1,000 mg by mouth every 6 (six) hours as needed for mild pain, moderate pain, fever or headache. Reported on 10/20/2015    Historical Provider, MD   amoxicillin (AMOXIL) 875 MG tablet Take 1 tablet (875 mg total) by mouth 2 (two) times daily. 10/20/15   Robyn Haber, MD  clonazePAM (KLONOPIN) 0.5 MG tablet Take 1 tablet (0.5 mg total) by mouth daily. 10/20/15   Robyn Haber, MD  fluticasone (FLONASE) 50 MCG/ACT nasal spray Place 2 sprays into both nostrils daily. Reported on 10/20/2015    Historical Provider, MD  Ginger, Zingiber officinalis, (GINGER ROOT) 550 MG CAPS Take 1 capsule by mouth as needed.    Historical Provider, MD  hydrochlorothiazide (HYDRODIURIL) 25 MG tablet Take 1 tablet (25 mg total) by mouth daily. 10/27/15   Maren Reamer, MD  levonorgestrel-ethinyl estradiol (AUBRA) 0.1-20 MG-MCG tablet Take 1 tablet by mouth daily.    Historical Provider, MD  metFORMIN (GLUCOPHAGE) 500 MG tablet Take 1 tablet (500 mg total) by mouth 2 (two) times daily with a meal. 10/27/15   Maren Reamer, MD  methocarbamol (ROBAXIN) 500 MG tablet Take 1 tablet (500 mg total) by mouth 3 (three) times daily between meals as needed. 10/31/15   Tanna Furry, MD  montelukast (SINGULAIR) 10 MG tablet TAKE 1 TABLET BY MOUTH DAILY Patient taking differently: TAKE 1 TABLET BY MOUTH DAILY IN MORNING 09/07/15   Lance Bosch, NP  Multiple Vitamins-Minerals (ADULT GUMMY) CHEW Chew 2 each by mouth daily.    Historical Provider, MD  pantoprazole (PROTONIX) 40 MG tablet Take 1 tablet (40 mg total) by mouth daily. 10/27/15   Maren Reamer, MD  pyridOXINE (VITAMIN B-6) 100 MG tablet Take 100 mg by mouth daily.    Historical Provider, MD   BP 149/104 mmHg  Pulse 82  Temp(Src) 97.9 F (36.6 C) (Oral)  Resp 18  Ht 5\' 2"  (1.575 m)  Wt 190 lb (86.183 kg)  BMI 34.74 kg/m2  SpO2 100% Physical Exam  Constitutional: She is oriented to person, place, and time. She appears well-developed and well-nourished. No distress.  HENT:  Head: Normocephalic.  Eyes: Conjunctivae are normal. Pupils are equal, round, and reactive to light. No scleral icterus.  Neck: Normal range  of motion. Neck supple. No thyromegaly present.  Cardiovascular: Normal rate and regular rhythm.  Exam reveals no gallop and no friction rub.   No murmur heard. Pulmonary/Chest: Effort normal and breath sounds normal. No respiratory distress. She has no wheezes. She has no rales.  Abdominal: Soft. Bowel sounds are normal. She exhibits no distension. There is no tenderness. There is no rebound.  Musculoskeletal: Normal range of motion. She exhibits tenderness.  Midline upper thoracic spine tenderness. Tenderness over the superior aspect of the sternum.  Neurological: She is alert and oriented to person, place, and time.  Skin: Skin is warm and dry. No rash noted.  Psychiatric: She has a normal mood and affect. Her behavior is normal.    ED Course  Procedures   DIAGNOSTIC STUDIES: Oxygen Saturation is 100% on RA, normal by my interpretation.    COORDINATION OF CARE: 6:17 PM Discussed  results of EKG. Will order labs. Will prescribe a muscle relaxant. Discussed treatment plan with pt at bedside and pt agreed to plan.   Labs Review Labs Reviewed  TROPONIN I    I have personally reviewed and evaluated these images as part of my medical decision-making.   EKG Interpretation   Date/Time:  Saturday Oct 31 2015 17:16:47 EDT Ventricular Rate:  87 PR Interval:  138 QRS Duration: 86 QT Interval:  372 QTC Calculation: 447 R Axis:   73 Text Interpretation:  Normal sinus rhythm ST \\T \ T wave abnormality,  consider inferior ischemia Abnormal ECG No significant change since  10-15-2015 Confirmed by Jeneen Rinks  MD, [place] ([id number]) on 10/31/2015 5:52:44 PM      MDM   Final diagnoses:  Chest wall pain    A great deal of anxiety with her presentation. Is a previously healthy 35 year old female with 25 ER visits in the last 6 months. Asked her to continue her Klonopin for anxiety. Continue her prescribed medicine for her reflux from her primary care physician. This is clearly chest wall pain. She  has abnormal/unchanged from 09/2015 EKG, but low heart score and negative troponin.  I personally performed the services described in this documentation, which was scribed in my presence. The recorded information has been reviewed and is accurate.       Tanna Furry, MD 10/31/15 [phone]

## 2015-11-04 ENCOUNTER — Emergency Department (HOSPITAL_COMMUNITY): Payer: Self-pay

## 2015-11-04 ENCOUNTER — Emergency Department (HOSPITAL_COMMUNITY)
Admission: EM | Admit: 2015-11-04 | Discharge: 2015-11-05 | Disposition: A | Discharge status: Home / Self Care | Payer: Self-pay | Attending: Emergency Medicine | Admitting: Emergency Medicine

## 2015-11-04 ENCOUNTER — Encounter (HOSPITAL_COMMUNITY): Payer: Self-pay | Admitting: Nurse Practitioner

## 2015-11-04 DIAGNOSIS — Z8709 Personal history of other diseases of the respiratory system: Secondary | ICD-10-CM | POA: Insufficient documentation

## 2015-11-04 DIAGNOSIS — Z87440 Personal history of urinary (tract) infections: Secondary | ICD-10-CM | POA: Insufficient documentation

## 2015-11-04 DIAGNOSIS — I10 Essential (primary) hypertension: Secondary | ICD-10-CM | POA: Insufficient documentation

## 2015-11-04 DIAGNOSIS — K219 Gastro-esophageal reflux disease without esophagitis: Secondary | ICD-10-CM | POA: Insufficient documentation

## 2015-11-04 DIAGNOSIS — Z792 Long term (current) use of antibiotics: Secondary | ICD-10-CM | POA: Insufficient documentation

## 2015-11-04 DIAGNOSIS — F419 Anxiety disorder, unspecified: Secondary | ICD-10-CM | POA: Insufficient documentation

## 2015-11-04 DIAGNOSIS — Z8742 Personal history of other diseases of the female genital tract: Secondary | ICD-10-CM | POA: Insufficient documentation

## 2015-11-04 DIAGNOSIS — Z87442 Personal history of urinary calculi: Secondary | ICD-10-CM | POA: Insufficient documentation

## 2015-11-04 DIAGNOSIS — Z87891 Personal history of nicotine dependence: Secondary | ICD-10-CM | POA: Insufficient documentation

## 2015-11-04 DIAGNOSIS — R079 Chest pain, unspecified: Secondary | ICD-10-CM | POA: Insufficient documentation

## 2015-11-04 DIAGNOSIS — Z79899 Other long term (current) drug therapy: Secondary | ICD-10-CM | POA: Insufficient documentation

## 2015-11-04 HISTORY — DX: Essential (primary) hypertension: I10

## 2015-11-04 LAB — BASIC METABOLIC PANEL
Anion gap: 11 (ref 5–15)
BUN: 7 mg/dL (ref 6–20)
CHLORIDE: 99 mmol/L — AB (ref 101–111)
CO2: 28 mmol/L (ref 22–32)
Calcium: 9.4 mg/dL (ref 8.9–10.3)
Creatinine, Ser: 0.77 mg/dL (ref 0.44–1.00)
GFR calc Af Amer: >60 mL/min (ref >60)
GFR calc non Af Amer: >60 mL/min (ref >60)
GLUCOSE: 118 mg/dL — AB (ref 65–99)
POTASSIUM: 3 mmol/L — AB (ref 3.5–5.1)
Sodium: 138 mmol/L (ref 135–145)

## 2015-11-04 LAB — I-STAT TROPONIN, ED: Troponin i, poc: 0 ng/mL (ref 0.00–0.08)

## 2015-11-04 LAB — CBC
HEMATOCRIT: 39.6 % (ref 36.0–46.0)
Hemoglobin: 13.1 g/dL (ref 12.0–15.0)
MCH: 29.2 pg (ref 26.0–34.0)
MCHC: 33.1 g/dL (ref 30.0–36.0)
MCV: 88.4 fL (ref 78.0–100.0)
Platelets: 296 10*3/uL (ref 150–400)
RBC: 4.48 MIL/uL (ref 3.87–5.11)
RDW: 12.5 % (ref 11.5–15.5)
WBC: 6.7 10*3/uL (ref 4.0–10.5)

## 2015-11-04 NOTE — ED Notes (Signed)
Nikki PA at bedside

## 2015-11-04 NOTE — ED Notes (Addendum)
She c/o 2 week history of CP. She has been to her PCP for evaluation with several tests but no diagnosis. They referred her here today for further workup. Pain radiates from chest to L shoulder and neck. Pain is intermittent. Nothing seems to  relieve the pain. she notices the pain is worse after she has a bowel movement. She is alert and breathing easily.

## 2015-11-05 LAB — I-STAT TROPONIN, ED: TROPONIN I, POC: 0 ng/mL (ref 0.00–0.08)

## 2015-11-05 LAB — D-DIMER, QUANTITATIVE (NOT AT ARMC)

## 2015-11-05 MED ORDER — KETOROLAC TROMETHAMINE 30 MG/ML IJ SOLN
30.0000 mg | Freq: Once | INTRAMUSCULAR | Status: AC
  Administered 2015-11-05: 30 mg via INTRAVENOUS
  Filled 2015-11-05: qty 1

## 2015-11-05 MED ORDER — LORAZEPAM 2 MG/ML IJ SOLN
1.0000 mg | Freq: Once | INTRAMUSCULAR | Status: AC
  Administered 2015-11-05: 1 mg via INTRAVENOUS
  Filled 2015-11-05: qty 1

## 2015-11-05 MED ORDER — POTASSIUM CHLORIDE CRYS ER 20 MEQ PO TBCR: 20.0000 meq | EXTENDED_RELEASE_TABLET | Freq: Two times a day (BID) | ORAL | Status: DC

## 2015-11-05 MED ORDER — POTASSIUM CHLORIDE CRYS ER 20 MEQ PO TBCR
40.0000 meq | EXTENDED_RELEASE_TABLET | Freq: Once | ORAL | Status: AC
  Administered 2015-11-05: 40 meq via ORAL
  Filled 2015-11-05: qty 2

## 2015-11-05 MED FILL — POTASSIUM CL ER 20 MEQ TAB: 20 | 1 days supply | Qty: 7 | Fill #0

## 2015-11-05 NOTE — Discharge Instructions (Signed)
Ms. DEYONNA VIEHMAN,  Nice meeting you! Please follow-up with your primary care provider. Return to the emergency department if you develop increased chest pain, shortness of breath, nausea/vomiting, new/worsening symptoms. Feel better soon!  S. Wendie Simmer, PA-C

## 2015-11-05 NOTE — ED Provider Notes (Signed)
CSN: [id number]     Arrival date & time 11/04/15  1746 History   First MD Initiated Contact with Patient 11/04/15 2330     Chief Complaint  Patient presents with  . Chest Pain   HPI   Christina Fox is a 35 y.o. female PMH significant for GERD, anxiety, migraines, HTN presenting with a 2 week history of chest pain. She describes her chest pain as left-sided, radiating to the left side of her upper neck, constant, nonexertional, discomfort, different from previous reflux pain, 7/10 pain scale. She has been taking Protonix without relief. She also endorses a headache which she describes as "watery." She states she has been seen by a neurologist as well as otolaryngology for this. She denies fevers, chills, shortness of breath, cough, abdominal pain, nausea, vomiting, change in bowel or bladder habits, recent surgeries, leg swelling, periods of immobility, history of blood clot or personal history of cancer.  Of note, she was seen in the ED on 5/6 for similar complaints.  Past Medical History  Diagnosis Date  . Headache(784.0)     sinus  . GERD (gastroesophageal reflux disease)   . Kidney stones     no current problem  . Anxiety   . Deviated septum 09/2011  . 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  . Complication of anesthesia     states small mouth  . Pelvic inflammatory disease   . Urinary tract infection   . Migraine   . Hypertension    Past Surgical History  Procedure Laterality Date  . Polyps removed      colon  . Wisdom tooth extraction    . Nasal septoplasty w/ turbinoplasty  10/04/2011    Procedure: NASAL SEPTOPLASTY WITH TURBINATE REDUCTION;  Surgeon: Ascencion Dike, MD;  Location: [place];  Service: ENT;  Laterality: Bilateral;   Family History  Problem Relation Age of Onset  . Cancer Mother   . Hypertension Mother   . Migraines Mother   . Multiple sclerosis Mother   . Hypertension Father   . Liver disease Father   . Cancer Brother   .  Hypertension Other   . Migraines Maternal Grandmother    Social History  Substance Use Topics  . Smoking status: Former Smoker -- 0.25 packs/day    Quit date: 08/23/2009  . Smokeless tobacco: Never Used  . Alcohol Use: No   OB History    Gravida Para Term Preterm AB TAB SAB Ectopic Multiple Living   4 4 4       4      Review of Systems  Ten systems are reviewed and are negative for acute change except as noted in the HPI   Allergies  Macrobid and Sulfa antibiotics  Home Medications   Prior to Admission medications   Medication Sig Start Date End Date Taking? Authorizing Provider  acetaminophen (TYLENOL) 500 MG tablet Take 1,000 mg by mouth every 6 (six) hours as needed for mild pain, moderate pain, fever or headache. Reported on 10/20/2015    Historical Provider, MD  amoxicillin (AMOXIL) 875 MG tablet Take 1 tablet (875 mg total) by mouth 2 (two) times daily. 10/20/15   Robyn Haber, MD  clonazePAM (KLONOPIN) 0.5 MG tablet Take 1 tablet (0.5 mg total) by mouth daily. 10/20/15   Robyn Haber, MD  fluticasone (FLONASE) 50 MCG/ACT nasal spray Place 2 sprays into both nostrils daily. Reported on 10/20/2015    Historical Provider, MD  Ginger, Zingiber officinalis, (GINGER  ROOT) 550 MG CAPS Take 1 capsule by mouth as needed.    Historical Provider, MD  hydrochlorothiazide (HYDRODIURIL) 25 MG tablet Take 1 tablet (25 mg total) by mouth daily. 10/27/15   Maren Reamer, MD  levonorgestrel-ethinyl estradiol (AUBRA) 0.1-20 MG-MCG tablet Take 1 tablet by mouth daily.    Historical Provider, MD  metFORMIN (GLUCOPHAGE) 500 MG tablet Take 1 tablet (500 mg total) by mouth 2 (two) times daily with a meal. 10/27/15   Maren Reamer, MD  methocarbamol (ROBAXIN) 500 MG tablet Take 1 tablet (500 mg total) by mouth 3 (three) times daily between meals as needed. 10/31/15   Tanna Furry, MD  montelukast (SINGULAIR) 10 MG tablet TAKE 1 TABLET BY MOUTH DAILY Patient taking differently: TAKE 1 TABLET BY  MOUTH DAILY IN MORNING 09/07/15   Lance Bosch, NP  Multiple Vitamins-Minerals (ADULT GUMMY) CHEW Chew 2 each by mouth daily.    Historical Provider, MD  pantoprazole (PROTONIX) 40 MG tablet Take 1 tablet (40 mg total) by mouth daily. 10/27/15   Maren Reamer, MD  pyridOXINE (VITAMIN B-6) 100 MG tablet Take 100 mg by mouth daily.    Historical Provider, MD   BP 137/83 mmHg  Pulse 81  Temp(Src) 98.9 F (37.2 C) (Oral)  Resp 16  SpO2 100% Physical Exam  Constitutional: She appears well-developed and well-nourished. No distress.  HENT:  Head: Normocephalic and atraumatic.  Right Ear: External ear normal.  Left Ear: External ear normal.  Nose: Nose normal.  Mouth/Throat: Oropharynx is clear and moist. No oropharyngeal exudate.  Bilateral TMs without erythema, drainage, bulging  Eyes: Conjunctivae are normal. Pupils are equal, round, and reactive to light. Right eye exhibits no discharge. Left eye exhibits no discharge. No scleral icterus.  Neck: No tracheal deviation present.  Cardiovascular: Normal rate, regular rhythm, normal heart sounds and intact distal pulses.  Exam reveals no gallop and no friction rub.   No murmur heard. Pulmonary/Chest: Effort normal and breath sounds normal. No respiratory distress. She has no wheezes. She has no rales. She exhibits no tenderness.  Abdominal: Soft. Bowel sounds are normal. She exhibits no distension and no mass. There is no tenderness. There is no rebound and no guarding.  Musculoskeletal: She exhibits no edema.  Lymphadenopathy:    She has no cervical adenopathy.  Neurological: She is alert. Coordination normal.  Skin: Skin is warm and dry. No rash noted. She is not diaphoretic. No erythema.  Psychiatric: Her behavior is normal.  Anxious appearing, tearful  Nursing note and vitals reviewed.   ED Course  Procedures  Labs Review Labs Reviewed  BASIC METABOLIC PANEL - Abnormal; Notable for the following:    Potassium 3.0 (*)     Chloride 99 (*)    Glucose, Bld 118 (*)    All other components within normal limits  CBC  D-DIMER, QUANTITATIVE (NOT AT [facility])  I-STAT TROPOININ, ED  Randolm Idol, ED    Imaging Review Dg Chest 2 View  11/04/2015  CLINICAL DATA:  Chest pain, hypertension EXAM: CHEST  2 VIEW COMPARISON:  10/15/2015 FINDINGS: The heart size and mediastinal contours are within normal limits. Both lungs are clear. The visualized skeletal structures are unremarkable. IMPRESSION: No active cardiopulmonary disease. Electronically Signed   By: Kathreen Devoid   On: 11/04/2015 16:58   I have personally reviewed and evaluated these images and lab results as part of my medical decision-making.   EKG Interpretation   Date/Time:  Wednesday Nov 04 2015 16:28:07  EDT Ventricular Rate:  94 PR Interval:  140 QRS Duration: 90 QT Interval:  362 QTC Calculation: 452 R Axis:   80 Text Interpretation:  Sinus rhythm with sinus arrhythmia with occasional  Premature ventricular complexes ST \\T \ T wave abnormality, consider  inferior ischemia ST \\T \ T wave abnormality, consider anterolateral  ischemia Abnormal ECG When compared with ECG of 10/31/2015, Premature  ventricular complexes are now Present Confirmed by [facility]  MD, DAVID  ([id number]) on 11/04/2015 4:34:13 PM      MDM   Final diagnoses:  Chest pain, unspecified chest pain type   Patient was seen on 5/6 for similar complaints, had unremarkable EKG and negative troponin. Patient takes Klonopin for her anxiety. Patient's pain was improved with Ativan and Toradol here. First EKG demonstrated sinus arrhythmia with occasional PVCs. Second EKG improved per Dr. Claudine Mouton. This most likely due to hypokalemia of 3. Supplemented potassium here and will discharge with 1 week of potassium. Patient is to be discharged with recommendation to follow up with PCP in regards to today's hospital visit. Chest pain is not likely of cardiac or pulmonary etiology d/t presentation, negative  d-dimer, VSS, no tracheal deviation, no JVD or new murmur, RRR, breath sounds equal bilaterally, negative troponin x2, and negative CXR. Pt has been advised to return to the ED if CP becomes exertional, associated with diaphoresis or nausea, radiates to left jaw/arm, worsens or becomes concerning in any way. Pt appears reliable for follow up and is agreeable to discharge.     [place], PA-C 11/05/15 [id number]  Everlene Balls, MD 11/05/15 1052

## 2015-11-05 NOTE — ED Notes (Signed)
Nikki PA at bedside

## 2015-11-06 ENCOUNTER — Encounter: Payer: Self-pay | Admitting: Emergency Medicine

## 2015-11-06 ENCOUNTER — Emergency Department
Admission: EM | Admit: 2015-11-06 | Discharge: 2015-11-06 | Disposition: A | Discharge status: Home / Self Care | Payer: Self-pay | Attending: Emergency Medicine | Admitting: Emergency Medicine

## 2015-11-06 DIAGNOSIS — I10 Essential (primary) hypertension: Secondary | ICD-10-CM | POA: Insufficient documentation

## 2015-11-06 DIAGNOSIS — R079 Chest pain, unspecified: Secondary | ICD-10-CM | POA: Insufficient documentation

## 2015-11-06 DIAGNOSIS — Z87891 Personal history of nicotine dependence: Secondary | ICD-10-CM | POA: Insufficient documentation

## 2015-11-06 DIAGNOSIS — Z79899 Other long term (current) drug therapy: Secondary | ICD-10-CM | POA: Insufficient documentation

## 2015-11-06 DIAGNOSIS — Z7984 Long term (current) use of oral hypoglycemic drugs: Secondary | ICD-10-CM | POA: Insufficient documentation

## 2015-11-06 LAB — BASIC METABOLIC PANEL
Anion gap: 10 (ref 5–15)
BUN: 10 mg/dL (ref 6–20)
CALCIUM: 9.5 mg/dL (ref 8.9–10.3)
CHLORIDE: 100 mmol/L — AB (ref 101–111)
CO2: 28 mmol/L (ref 22–32)
CREATININE: 0.73 mg/dL (ref 0.44–1.00)
GFR calc non Af Amer: >60 mL/min (ref >60)
Glucose, Bld: 82 mg/dL (ref 65–99)
Potassium: 3.3 mmol/L — ABNORMAL LOW (ref 3.5–5.1)
SODIUM: 138 mmol/L (ref 135–145)

## 2015-11-06 LAB — URINALYSIS COMPLETE WITH MICROSCOPIC (ARMC ONLY)
BACTERIA UA: NONE SEEN
Bilirubin Urine: NEGATIVE
Glucose, UA: NEGATIVE mg/dL
Ketones, ur: NEGATIVE mg/dL
NITRITE: NEGATIVE
PH: 6 (ref 5.0–8.0)
PROTEIN: NEGATIVE mg/dL
Specific Gravity, Urine: 1.003 — ABNORMAL LOW (ref 1.005–1.030)

## 2015-11-06 LAB — CBC
HEMATOCRIT: 39.8 % (ref 35.0–47.0)
HEMOGLOBIN: 13.6 g/dL (ref 12.0–16.0)
MCH: 29.5 pg (ref 26.0–34.0)
MCHC: 34.1 g/dL (ref 32.0–36.0)
MCV: 86.6 fL (ref 80.0–100.0)
Platelets: 289 10*3/uL (ref 150–440)
RBC: 4.59 MIL/uL (ref 3.80–5.20)
RDW: 12.9 % (ref 11.5–14.5)
WBC: 7.7 10*3/uL (ref 3.6–11.0)

## 2015-11-06 LAB — TROPONIN I: Troponin I: <0.03 ng/mL (ref <0.031)

## 2015-11-06 LAB — LIPASE, BLOOD: Lipase: 36 U/L (ref 11–51)

## 2015-11-06 LAB — POCT PREGNANCY, URINE: Preg Test, Ur: NEGATIVE

## 2015-11-06 NOTE — ED Provider Notes (Signed)
[facility] Emergency Department Provider Note    ____________________________________________  Time seen: ~1845  I have reviewed the triage vital signs and the nursing notes.   HISTORY  Chief Complaint Chest Pain   History limited by: Not Limited   HPI Christina Fox is a 35 y.o. female who presents to the emergency department today because of concerns for continued chest pain and odd feelings. The chest pain is in the center of the chest. She is also having some pain under her left breast. She states this is worse after she defecates. The patient has had these symptoms going on for quite some time. She has had multiple ED visits and is followed up with her primary care. At this point she states none has been able to tell her what is causing these symptoms. She states today it occurred when she was over at her mom's house which is close to [place]. She states she feels like there is something wrong but none is been able to tell her what it is. She denies any recent fevers.    Past Medical History  Diagnosis Date  . Headache(784.0)     sinus  . GERD (gastroesophageal reflux disease)   . Kidney stones     no current problem  . Anxiety   . Deviated septum 09/2011  . 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  . Complication of anesthesia     states small mouth  . Pelvic inflammatory disease   . Urinary tract infection   . Migraine   . Hypertension     Patient Active Problem List   Diagnosis Date Noted  . Headache 10/13/2015  . 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 Anxiety disorder 11/03/2014    Past Surgical History  Procedure Laterality Date  . Polyps removed      colon  . Wisdom tooth extraction    . Nasal septoplasty w/ turbinoplasty  10/04/2011    Procedure: NASAL SEPTOPLASTY WITH TURBINATE REDUCTION;  Surgeon: Ascencion Dike, MD;  Location: [place];  Service: ENT;  Laterality: Bilateral;    Current Outpatient Rx  Name  Route   Sig  Dispense  Refill  . acetaminophen (TYLENOL) 500 MG tablet   Oral   Take 1,000 mg by mouth every 6 (six) hours as needed for mild pain, moderate pain, fever or headache. Reported on 10/20/2015         . amoxicillin (AMOXIL) 875 MG tablet   Oral   Take 1 tablet (875 mg total) by mouth 2 (two) times daily.   20 tablet   0   . clonazePAM (KLONOPIN) 0.5 MG tablet   Oral   Take 1 tablet (0.5 mg total) by mouth daily.   30 tablet   5   . fluticasone (FLONASE) 50 MCG/ACT nasal spray   Each Nare   Place 2 sprays into both nostrils daily. Reported on 10/20/2015         . Ginger, Zingiber officinalis, (GINGER ROOT) 550 MG CAPS   Oral   Take 1 capsule by mouth as needed.         . hydrochlorothiazide (HYDRODIURIL) 25 MG tablet   Oral   Take 1 tablet (25 mg total) by mouth daily.   90 tablet   3   . levonorgestrel-ethinyl estradiol (AUBRA) 0.1-20 MG-MCG tablet   Oral   Take 1 tablet by mouth daily.         . metFORMIN (GLUCOPHAGE) 500 MG tablet  Oral   Take 1 tablet (500 mg total) by mouth 2 (two) times daily with a meal.   180 tablet   3   . methocarbamol (ROBAXIN) 500 MG tablet   Oral   Take 1 tablet (500 mg total) by mouth 3 (three) times daily between meals as needed.   20 tablet   0   . montelukast (SINGULAIR) 10 MG tablet      TAKE 1 TABLET BY MOUTH DAILY Patient taking differently: TAKE 1 TABLET BY MOUTH DAILY IN MORNING   30 tablet   3   . Multiple Vitamins-Minerals (ADULT GUMMY) CHEW   Oral   Chew 2 each by mouth daily.         . pantoprazole (PROTONIX) 40 MG tablet   Oral   Take 1 tablet (40 mg total) by mouth daily.   30 tablet   3   . potassium chloride SA (K-DUR,KLOR-CON) 20 MEQ tablet   Oral   Take 1 tablet (20 mEq total) by mouth 2 (two) times daily.   7 tablet   0   . pyridOXINE (VITAMIN B-6) 100 MG tablet   Oral   Take 100 mg by mouth daily.           Allergies Macrobid and Sulfa antibiotics  Family History  Problem  Relation Age of Onset  . Cancer Mother   . Hypertension Mother   . Migraines Mother   . Multiple sclerosis Mother   . Hypertension Father   . Liver disease Father   . Cancer Brother   . Hypertension Other   . Migraines Maternal Grandmother     Social History Social History  Substance Use Topics  . Smoking status: Former Smoker -- 0.25 packs/day    Quit date: 08/23/2009  . Smokeless tobacco: Never Used  . Alcohol Use: No    Review of Systems  Constitutional: Negative for fever. Cardiovascular: Positive for chest pain. Respiratory: Negative for shortness of breath. Gastrointestinal: Positive for left upper quadrant pain Neurological: Negative for headaches, focal weakness or numbness. 10-point ROS otherwise negative.  ____________________________________________   PHYSICAL EXAM:  VITAL SIGNS: ED Triage Vitals  Enc Vitals Group     BP 11/06/15 1641 143/83 mmHg     Pulse Rate 11/06/15 1641 85     Resp 11/06/15 1641 18     Temp 11/06/15 1641 98.3 F (36.8 C)     Temp Source 11/06/15 1641 Oral     SpO2 11/06/15 1641 99 %     Weight 11/06/15 1641 185 lb (83.915 kg)     Height 11/06/15 1641 5\' 2"  (1.575 m)     Head Cir --      Peak Flow --      Pain Score 11/06/15 1642 7   Constitutional: Alert and oriented. Well appearing and in no distress. Eyes: Conjunctivae are normal. PERRL. Normal extraocular movements. ENT   Head: Normocephalic and atraumatic.   Nose: No congestion/rhinnorhea.   Mouth/Throat: Mucous membranes are moist.   Neck: No stridor. Hematological/Lymphatic/Immunilogical: No cervical lymphadenopathy. Cardiovascular: Normal rate, regular rhythm.  No murmurs, rubs, or gallops. Respiratory: Normal respiratory effort without tachypnea nor retractions. Breath sounds are clear and equal bilaterally. No wheezes/rales/rhonchi. Gastrointestinal: Soft and nontender. No distention. Genitourinary: Deferred Musculoskeletal: Normal range of motion in  all extremities. No joint effusions.   Neurologic:  Normal speech and language. No gross focal neurologic deficits are appreciated.  Skin:  Skin is warm, dry and intact. No rash noted. Psychiatric: Mood  and affect are normal. Speech and behavior are normal. Patient exhibits appropriate insight and judgment.  ____________________________________________    LABS (pertinent positives/negatives)  Labs Reviewed  BASIC METABOLIC PANEL - Abnormal; Notable for the following:    Potassium 3.3 (*)    Chloride 100 (*)    All other components within normal limits  URINALYSIS COMPLETEWITH MICROSCOPIC ([facility] ONLY) - Abnormal; Notable for the following:    Color, Urine COLORLESS (*)    APPearance CLEAR (*)    Specific Gravity, Urine 1.003 (*)    Hgb urine dipstick 1+ (*)    Leukocytes, UA TRACE (*)    Squamous Epithelial / LPF 0-5 (*)    All other components within normal limits  CBC  TROPONIN I  LIPASE, BLOOD  POC URINE PREG, ED  POCT PREGNANCY, URINE     ____________________________________________   EKG  I, Nance Pear, attending physician, personally viewed and interpreted this EKG  EKG Time: 1634 Rate: 87 Rhythm: normal sinus rhythm Axis: normal Intervals: qtc 452 QRS: narrow ST changes: t wave invesion in III, aVF, V3, V4, V5 Impression: abnormal ekg   ____________________________________________    RADIOLOGY  None  ____________________________________________   PROCEDURES  Procedure(s) performed: None  Critical Care performed: No  ____________________________________________   INITIAL IMPRESSION / ASSESSMENT AND PLAN / ED COURSE  Pertinent labs & imaging results that were available during my care of the patient were reviewed by me and considered in my medical decision making (see chart for details).  Patient presented to the emergency department today because of continued chest pain and on feelings in her chest. Blood work and EKG today without any  acute findings. Patient mildly okay lately. Patient has had multiple workups multiple ED's recently. None have found any obvious etiology of the patient's pain. This point I had a long discussion with the patient. I did encourage primary care follow-up. Splint to the patient to primary care can arrange test and diagnostics that we cannot do to the emergency department for example endoscopy. Feel patient is safe to discharge from the emergency department.  ____________________________________________   FINAL CLINICAL IMPRESSION(S) / ED DIAGNOSES  Final diagnoses:  Chest pain, unspecified chest pain type     Nance Pear, MD 11/06/15 1911

## 2015-11-06 NOTE — Discharge Instructions (Signed)
Please seek medical attention for any high fevers, chest pain, shortness of breath, change in behavior, persistent vomiting, bloody stool or any other new or concerning symptoms. ° ° °Nonspecific Chest Pain °It is often hard to find the cause of chest pain. There is always a chance that your pain could be related to something serious, such as a heart attack or a blood clot in your lungs. Chest pain can also be caused by conditions that are not life-threatening. If you have chest pain, it is very important to follow up with your doctor. ° °HOME CARE °· If you were prescribed an antibiotic medicine, finish it all even if you start to feel better. °· Avoid any activities that cause chest pain. °· Do not use any tobacco products, including cigarettes, chewing tobacco, or electronic cigarettes. If you need help quitting, ask your doctor. °· Do not drink alcohol. °· Take medicines only as told by your doctor. °· Keep all follow-up visits as told by your doctor. This is important. This includes any further testing if your chest pain does not go away. °· Your doctor may tell you to keep your head raised (elevated) while you sleep. °· Make lifestyle changes as told by your doctor. These may include: °¨ Getting regular exercise. Ask your doctor to suggest some activities that are safe for you. °¨ Eating a heart-healthy diet. Your doctor or a diet specialist (dietitian) can help you to learn healthy eating options. °¨ Maintaining a healthy weight. °¨ Managing diabetes, if necessary. °¨ Reducing stress. °GET HELP IF: °· Your chest pain does not go away, even after treatment. °· You have a rash with blisters on your chest. °· You have a fever. °GET HELP RIGHT AWAY IF: °· Your chest pain is worse. °· You have an increasing cough, or you cough up blood. °· You have severe belly (abdominal) pain. °· You feel extremely weak. °· You pass out (faint). °· You have chills. °· You have sudden, unexplained chest discomfort. °· You have  sudden, unexplained discomfort in your arms, back, neck, or jaw. °· You have shortness of breath at any time. °· You suddenly start to sweat, or your skin gets clammy. °· You feel nauseous. °· You vomit. °· You suddenly feel light-headed or dizzy. °· Your heart begins to beat quickly, or it feels like it is skipping beats. °These symptoms may be an emergency. Do not wait to see if the symptoms will go away. Get medical help right away. Call your local emergency services (911 in the [place] Do not drive yourself to the hospital. °  °This information is not intended to replace advice given to you by your health care provider. Make sure you discuss any questions you have with your health care provider. °  °Document Released: 11/30/2007 Document Revised: 07/04/2014 Document Reviewed: 01/17/2014 °Elsevier Interactive Patient Education ©2016 [employer] ° °

## 2015-11-06 NOTE — ED Notes (Signed)
Patient to ER for c/o chest pain with pain under left breast that radiates to left shoulder blade and left flank. Has h/o kidney stones. States when sitting still, pain is worse. Worsens after BM as well.

## 2015-11-09 ENCOUNTER — Ambulatory Visit: Payer: Self-pay | Attending: Internal Medicine | Admitting: Internal Medicine

## 2015-11-09 ENCOUNTER — Encounter: Payer: Self-pay | Admitting: Internal Medicine

## 2015-11-09 VITALS — BP 133/83 | HR 105 | Temp 97.9°F | Resp 18 | Ht 63.5 in | Wt 192.8 lb

## 2015-11-09 DIAGNOSIS — R079 Chest pain, unspecified: Secondary | ICD-10-CM

## 2015-11-09 DIAGNOSIS — Z87442 Personal history of urinary calculi: Secondary | ICD-10-CM | POA: Insufficient documentation

## 2015-11-09 DIAGNOSIS — Z7984 Long term (current) use of oral hypoglycemic drugs: Secondary | ICD-10-CM | POA: Insufficient documentation

## 2015-11-09 DIAGNOSIS — Z79899 Other long term (current) drug therapy: Secondary | ICD-10-CM | POA: Insufficient documentation

## 2015-11-09 DIAGNOSIS — F419 Anxiety disorder, unspecified: Secondary | ICD-10-CM | POA: Insufficient documentation

## 2015-11-09 DIAGNOSIS — E876 Hypokalemia: Secondary | ICD-10-CM | POA: Insufficient documentation

## 2015-11-09 DIAGNOSIS — I10 Essential (primary) hypertension: Secondary | ICD-10-CM | POA: Insufficient documentation

## 2015-11-09 DIAGNOSIS — K219 Gastro-esophageal reflux disease without esophagitis: Secondary | ICD-10-CM | POA: Insufficient documentation

## 2015-11-09 NOTE — Patient Instructions (Signed)
Low-Sodium Eating Plan °Sodium raises blood pressure and causes water to be held in the body. Getting less sodium from food will help lower your blood pressure, reduce any swelling, and protect your heart, liver, and kidneys. We get sodium by adding salt (sodium chloride) to food. Most of our sodium comes from canned, boxed, and frozen foods. Restaurant foods, fast foods, and pizza are also very high in sodium. Even if you take medicine to lower your blood pressure or to reduce fluid in your body, getting less sodium from your food is important. °WHAT IS MY PLAN? °Most people should limit their sodium intake to 2,300 mg a day. Your health care provider recommends that you limit your sodium intake to __________ a day.  °WHAT DO I NEED TO KNOW ABOUT THIS EATING PLAN? °For the low-sodium eating plan, you will follow these general guidelines: °· Choose foods with a % Daily Value for sodium of less than 5% (as listed on the food label).   °· Use salt-free seasonings or herbs instead of table salt or sea salt.   °· Check with your health care provider or pharmacist before using salt substitutes.   °· Eat fresh foods. °· Eat more vegetables and fruits. °· Limit canned vegetables. If you do use them, rinse them well to decrease the sodium.   °· Limit cheese to 1 oz (28 g) per day.    °· Eat lower-sodium products, often labeled as "lower sodium" or "no salt added." °· Avoid foods that contain monosodium glutamate (MSG). MSG is sometimes added to Chinese food and some canned foods.   °· Check food labels (Nutrition Facts labels) on foods to learn how much sodium is in one serving. °· Eat more home-cooked food and less restaurant, buffet, and fast food.  °· When eating at a restaurant, ask that your food be prepared with less salt, or no salt if possible.   °HOW DO I READ FOOD LABELS FOR SODIUM INFORMATION? °The Nutrition Facts label lists the amount of sodium in one serving of the food. If you eat more than one serving, you  must multiply the listed amount of sodium by the number of servings. °Food labels may also identify foods as: °· Sodium free--Less than 5 mg in a serving. °· Very low sodium--35 mg or less in a serving. °· Low sodium--140 mg or less in a serving. °· Light in sodium--50% less sodium in a serving. For example, if a food that usually has 300 mg of sodium is changed to become light in sodium, it will have 150 mg of sodium. °· Reduced sodium--25% less sodium in a serving. For example, if a food that usually has 400 mg of sodium is changed to reduced sodium, it will have 300 mg of sodium. °WHAT FOODS CAN I EAT? °Grains  °Low-sodium cereals, including oats, puffed wheat and rice, and shredded wheat cereals. Low-sodium crackers. Unsalted rice and pasta. Lower-sodium bread.  °Vegetables  °Frozen or fresh vegetables. Low-sodium or reduced-sodium canned vegetables. Low-sodium or reduced-sodium tomato sauce and paste. Low-sodium or reduced-sodium tomato and vegetable juices.  °Fruits  °Fresh, frozen, and canned fruit. Fruit juice.  °Meat and Other Protein Products  °Low-sodium canned tuna and salmon. Fresh or frozen meat, poultry, seafood, and fish. Lamb. Unsalted nuts. Dried beans, peas, and lentils without added salt. Unsalted canned beans. Homemade soups without salt. Eggs.  °Dairy  °Milk. Soy milk. Ricotta cheese. Low-sodium or reduced-sodium cheeses. Yogurt.  °Condiments  °Fresh and dried herbs and spices. Salt-free seasonings. Onion and garlic powders. Low-sodium varieties of mustard and ketchup. Fresh or refrigerated horseradish. Lemon   juice.  °Fats and Oils   °Reduced-sodium salad dressings. Unsalted butter.   °Other  °Unsalted popcorn and pretzels.  °The items listed above may not be a complete list of recommended foods or beverages. Contact your dietitian for more options. °WHAT FOODS ARE NOT RECOMMENDED? °Grains  °Instant hot cereals. Bread stuffing, pancake, and biscuit mixes. Croutons. Seasoned rice or pasta mixes.  Noodle soup cups. Boxed or frozen macaroni and cheese. Self-rising flour. Regular salted crackers. °Vegetables  °Regular canned vegetables. Regular canned tomato sauce and paste. Regular tomato and vegetable juices. Frozen vegetables in sauces. Salted French fries. Olives. Pickles. Relishes. Sauerkraut. Salsa. °Meat and Other Protein Products  °Salted, canned, smoked, spiced, or pickled meats, seafood, or fish. Bacon, ham, sausage, hot dogs, corned beef, chipped beef, and packaged luncheon meats. Salt pork. Jerky. Pickled herring. Anchovies, regular canned tuna, and sardines. Salted nuts. °Dairy  °Processed cheese and cheese spreads. Cheese curds. Blue cheese and cottage cheese. Buttermilk.  °Condiments  °Onion and garlic salt, seasoned salt, table salt, and sea salt. Canned and packaged gravies. Worcestershire sauce. Tartar sauce. Barbecue sauce. Teriyaki sauce. Soy sauce, including reduced sodium. Steak sauce. Fish sauce. Oyster sauce. Cocktail sauce. Horseradish that you find on the shelf. Regular ketchup and mustard. Meat flavorings and tenderizers. Bouillon cubes. Hot sauce. Tabasco sauce. Marinades. Taco seasonings. Relishes. °Fats and Oils   °Regular salad dressings. Salted butter. Margarine. Ghee. Bacon fat.  °Other  °Potato and tortilla chips. Corn chips and puffs. Salted popcorn and pretzels. Canned or dried soups. Pizza. Frozen entrees and pot pies.   °The items listed above may not be a complete list of foods and beverages to avoid. Contact your dietitian for more information. °  °This information is not intended to replace advice given to you by your health care provider. Make sure you discuss any questions you have with your health care provider. °  °Document Released: 12/03/2001 Document Revised: 07/04/2014 Document Reviewed: 04/17/2013 °Elsevier Interactive Patient Education ©2016 [employer] ° °

## 2015-11-09 NOTE — Progress Notes (Signed)
Patient is here for FU ED  Patient complains of chest discomfort.  Patient has taken medication and patient has eaten.

## 2015-11-09 NOTE — Progress Notes (Signed)
Christina Fox, is a 35 y.o. female  [id number]  [id number]  DOB - 05-18-1981  Chief Complaint  Patient presents with  . Follow-up    chest discomfort        Subjective:   Christina Fox is a 35 y.o. female here today for a follow up visit, last seen 10/27/15 for chest pain, felt likely GERD w/ Anxiety/panic attacks, and started on ppi.  Also started on HCTZ for htn.  Since than, she has been in ED 3 more times for "chest pain".  Workup essentially negative except hypokalemia recently.     Per pt, "chest pains" difficult to describe, usually comes/go, meg area, but will radiate to her back at times, and sometimes cause left hand numbness.  No diaphoresis, palpitations, n/v., orthopnea, syncope.  She is worried that something is wrong w/ her.  She has appts set up to see her ENT and urologist to r/o kidney stones. Per pt, was told she had kidney stones in past, denies dysuria.  Mention a "cyst or knot" next to her vaginal area, mild ttp x 2 wks or so, no vaginal discharge.   Pt's young dgt about 7-8yo here w/ her today.  Patient has No headache, No chest pain currently, No abdominal pain - No Nausea, No new weakness tingling or numbness, No Cough - SOB.  No problems updated.  ALLERGIES: Allergies  Allergen Reactions  . Macrobid [Nitrofurantoin Monohyd Macro] Rash  . Sulfa Antibiotics Hives and Rash    PAST MEDICAL HISTORY: Past Medical History  Diagnosis Date  . Headache(784.0)     sinus  . GERD (gastroesophageal reflux disease)   . Kidney stones     no current problem  . Anxiety   . Deviated septum 09/2011  . 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  . Complication of anesthesia     states small mouth  . Pelvic inflammatory disease   . Urinary tract infection   . Migraine   . Hypertension     MEDICATIONS AT HOME: Prior to Admission medications   Medication Sig Start Date End Date Taking? Authorizing Provider  acetaminophen (TYLENOL) 500 MG  tablet Take 1,000 mg by mouth every 6 (six) hours as needed for mild pain, moderate pain, fever or headache. Reported on 10/20/2015   Yes Historical Provider, MD  clonazePAM (KLONOPIN) 0.5 MG tablet Take 1 tablet (0.5 mg total) by mouth daily. 10/20/15  Yes Robyn Haber, MD  hydrochlorothiazide (HYDRODIURIL) 25 MG tablet Take 1 tablet (25 mg total) by mouth daily. 10/27/15  Yes Maren Reamer, MD  pantoprazole (PROTONIX) 40 MG tablet Take 1 tablet (40 mg total) by mouth daily. 10/27/15  Yes Maren Reamer, MD  amoxicillin (AMOXIL) 875 MG tablet Take 1 tablet (875 mg total) by mouth 2 (two) times daily. Patient not taking: Reported on 11/09/2015 10/20/15   Robyn Haber, MD  fluticasone [facility]) 50 MCG/ACT nasal spray Place 2 sprays into both nostrils daily. Reported on 11/09/2015    Historical Provider, MD  Ginger, Zingiber officinalis, (GINGER ROOT) 550 MG CAPS Take 1 capsule by mouth as needed. Reported on 11/09/2015    Historical Provider, MD  levonorgestrel-ethinyl estradiol (AUBRA) 0.1-20 MG-MCG tablet Take 1 tablet by mouth daily. Reported on 11/09/2015    Historical Provider, MD  metFORMIN (GLUCOPHAGE) 500 MG tablet Take 1 tablet (500 mg total) by mouth 2 (two) times daily with a meal. Patient not taking: Reported on 11/09/2015 10/27/15   Maren Reamer, MD  methocarbamol (  ROBAXIN) 500 MG tablet Take 1 tablet (500 mg total) by mouth 3 (three) times daily between meals as needed. Patient not taking: Reported on 11/09/2015 10/31/15   Tanna Furry, MD  montelukast (SINGULAIR) 10 MG tablet TAKE 1 TABLET BY MOUTH DAILY Patient not taking: Reported on 11/09/2015 09/07/15   Lance Bosch, NP  Multiple Vitamins-Minerals (ADULT GUMMY) CHEW Chew 2 each by mouth daily. Reported on 11/09/2015    Historical Provider, MD  potassium chloride SA (K-DUR,KLOR-CON) 20 MEQ tablet Take 1 tablet (20 mEq total) by mouth 2 (two) times daily. Patient not taking: Reported on 11/09/2015 11/05/15   Arena Lions, PA-C    pyridOXINE (VITAMIN B-6) 100 MG tablet Take 100 mg by mouth daily. Reported on 11/09/2015    Historical Provider, MD     Objective:   Filed Vitals:   11/09/15 1718  BP: 133/83  Pulse: 105  Temp: 97.9 F (36.6 C)  TempSrc: Oral  Resp: 18  Height: 5' 3.5" (1.613 m)  Weight: 192 lb 12.8 oz (87.454 kg)  SpO2: 98%    Exam General appearance : Awake, alert, not in any distress. Speech Clear. Not toxic looking, pleasant, less anxious appearing today.  Morbid obese. HEENT: Atraumatic and Normocephalic, pupils equally reactive to light. Neck: supple, no JVD. No cervical lymphadenopathy.  Chest:Good air entry bilaterally, no added sounds. No pleuritic pains, pain not reproducible. CVS: S1 S2 regular, no murmurs/gallups or rubs. Abdomen: Bowel sounds active, Non tender and not distended with no gaurding, rigidity or rebound. Extremities: B/L Lower Ext shows no edema, both legs are warm to touch Neurology: Awake alert, and oriented X 3, CN II-XII grossly intact, Non focal Skin:No Rash  Data Review Lab Results  Component Value Date   HGBA1C 5.2 10/27/2015    Depression screen [facility] 2/9 11/09/2015 10/27/2015 10/20/2015 01/15/2015 10/29/2014  Decreased Interest 1 0 0 0 0  Down, Depressed, Hopeless 1 0 0 0 0  PHQ - 2 Score 2 0 0 0 0  Altered sleeping 0 1 - - -  Tired, decreased energy 0 2 - - -  Change in appetite 0 0 - - -  Feeling bad or failure about yourself  0 0 - - -  Trouble concentrating 0 0 - - -  Moving slowly or fidgety/restless 0 0 - - -  Suicidal thoughts 0 0 - - -  PHQ-9 Score 2 3 - - -  Difficult doing work/chores - Somewhat difficult - - -      Assessment & Plan   1. Hypokalemia Will rck k today. - BASIC METABOLIC PANEL WITH GFR   2. Anxiety disorder, unspecified anxiety disorder type Currently on Klonopin .5 qday, for which she obtained from prior PCP. Recd f/u w/ [facility] again, has not seen them since last year. - - Ambulatory referral to Psychiatry as  well, for further eval  3. Chest pain, unspecified chest pain type, with at least 19 ER visits in last 58months ago. - gerd vs MSC pain vs arrhythmia not captured on the numerous EKGs? Done so far vs anxiety/panic d/o. - Ambulatory referral to Cardiology - stresstest Claudette Laws monitor? Defer to cards for further eval - Ambulatory referral to Gastroenterology - ?egd - defer to gi for further eval, neg H pylori, continue ppi   4. Morbid obesity, unspecified obesity type ([place]) - encourage diet /exercise, pt has lost some weight since last exam, she says her clothes are looser.  5. Essential hypertension contnolled on hctz, recd  low salt diet  6. Watery sensation in ears - has ent f/u per pt.  7. Hx of kidney stones  - has urology f/u per pt.  8. Pap smear - has appt w/ me end of month  9. Cyst near vaginal area, suspect ingroin hair/boil Recd warm Sitz baths for now .    Patient have been counseled extensively about nutrition and exercise  Return in about 2 months (around 01/09/2016).  The patient was given clear instructions to go to ER or return to medical center if symptoms don't improve, worsen or new problems develop. The patient verbalized understanding. The patient was told to call to get lab results if they haven't heard anything in the next week.    Maren Reamer, MD, [place] [facility] [place], [place]   11/09/2015, 5:30 PM

## 2015-11-10 ENCOUNTER — Other Ambulatory Visit: Payer: Self-pay | Admitting: Internal Medicine

## 2015-11-10 ENCOUNTER — Encounter: Payer: Self-pay | Admitting: Gastroenterology

## 2015-11-10 LAB — BASIC METABOLIC PANEL WITH GFR
BUN: 8 mg/dL (ref 7–25)
CHLORIDE: 100 mmol/L (ref 98–110)
CO2: 28 mmol/L (ref 20–31)
CREATININE: 0.75 mg/dL (ref 0.50–1.10)
Calcium: 8.9 mg/dL (ref 8.6–10.2)
GFR, Est African American: >89 mL/min (ref >=60)
GFR, Est Non African American: >89 mL/min (ref >=60)
Glucose, Bld: 97 mg/dL (ref 65–99)
Potassium: 3.1 mmol/L — ABNORMAL LOW (ref 3.5–5.3)
Sodium: 137 mmol/L (ref 135–146)

## 2015-11-10 MED ORDER — POTASSIUM CHLORIDE CRYS ER 10 MEQ PO TBCR: 10.0000 meq | EXTENDED_RELEASE_TABLET | Freq: Every day | ORAL | Status: DC

## 2015-11-11 ENCOUNTER — Ambulatory Visit (INDEPENDENT_AMBULATORY_CARE_PROVIDER_SITE_OTHER): Payer: Self-pay

## 2015-11-11 ENCOUNTER — Ambulatory Visit (INDEPENDENT_AMBULATORY_CARE_PROVIDER_SITE_OTHER): Payer: Self-pay | Admitting: Family Medicine

## 2015-11-11 VITALS — BP 148/94 | HR 92 | Temp 98.6°F | Resp 18 | Ht 63.0 in | Wt 191.0 lb

## 2015-11-11 DIAGNOSIS — R1084 Generalized abdominal pain: Secondary | ICD-10-CM

## 2015-11-11 DIAGNOSIS — R0789 Other chest pain: Secondary | ICD-10-CM

## 2015-11-11 DIAGNOSIS — K5901 Slow transit constipation: Secondary | ICD-10-CM

## 2015-11-11 DIAGNOSIS — M940 Chondrocostal junction syndrome [Tietze]: Secondary | ICD-10-CM

## 2015-11-11 DIAGNOSIS — K219 Gastro-esophageal reflux disease without esophagitis: Secondary | ICD-10-CM

## 2015-11-11 DIAGNOSIS — F419 Anxiety disorder, unspecified: Secondary | ICD-10-CM

## 2015-11-11 LAB — POCT URINALYSIS DIP (MANUAL ENTRY)
Bilirubin, UA: NEGATIVE
Blood, UA: NEGATIVE
Glucose, UA: NEGATIVE
Ketones, POC UA: NEGATIVE
Nitrite, UA: NEGATIVE
Protein Ur, POC: NEGATIVE
SPEC GRAV UA: 1.01
Urobilinogen, UA: 0.2
pH, UA: 7

## 2015-11-11 LAB — POCT CBC
GRANULOCYTE PERCENT: 78.2 % (ref 37–80)
HEMATOCRIT: 40.4 % (ref 37.7–47.9)
Hemoglobin: 14.4 g/dL (ref 12.2–16.2)
Lymph, poc: 1.7 (ref 0.6–3.4)
MCH, POC: 30.8 pg (ref 27–31.2)
MCHC: 35.8 g/dL — AB (ref 31.8–35.4)
MCV: 86.1 fL (ref 80–97)
MID (CBC): 0.5 (ref 0–0.9)
MPV: 7.8 fL (ref 0–99.8)
POC GRANULOCYTE: 7.9 — AB (ref 2–6.9)
POC LYMPH %: 17.2 % (ref 10–50)
POC MID %: 4.6 % (ref 0–12)
Platelet Count, POC: 344 10*3/uL (ref 142–424)
RBC: 4.69 M/uL (ref 4.04–5.48)
RDW, POC: 12.6 %
WBC: 10.1 10*3/uL (ref 4.6–10.2)

## 2015-11-11 LAB — POC MICROSCOPIC URINALYSIS (UMFC): MUCUS RE: ABSENT

## 2015-11-11 LAB — POCT URINE PREGNANCY: Preg Test, Ur: NEGATIVE

## 2015-11-11 MED ORDER — POTASSIUM CHLORIDE CRYS ER 20 MEQ PO TBCR: 20.0000 meq | EXTENDED_RELEASE_TABLET | Freq: Every day | ORAL | Status: DC
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
## 2015-11-11 NOTE — Patient Instructions (Addendum)
1. Recommend taking a stool softener (Miralax or colace) twice daily.    IF you received an x-ray today, you will receive an invoice from [facility]. Please contact [facility] at [phone] with questions or concerns regarding your invoice.   IF you received labwork today, you will receive an invoice from [employer]. Please contact [facility] at [phone] with questions or concerns regarding your invoice.   Our billing staff will not be able to assist you with questions regarding bills from these companies.  You will be contacted with the lab results as soon as they are available. The fastest way to get your results is to activate your My Chart account. Instructions are located on the last page of this paperwork. If you have not heard from [place] regarding the results in 2 weeks, please contact this office.     Costochondritis Costochondritis, sometimes called Tietze syndrome, is a swelling and irritation (inflammation) of the tissue (cartilage) that connects your ribs with your breastbone (sternum). It causes pain in the chest and rib area. Costochondritis usually goes away on its own over time. It can take up to 6 weeks or longer to get better, especially if you are unable to limit your activities. CAUSES  Some cases of costochondritis have no known cause. Possible causes include:  Injury (trauma).  Exercise or activity such as lifting.  Severe coughing. SIGNS AND SYMPTOMS  Pain and tenderness in the chest and rib area.  Pain that gets worse when coughing or taking deep breaths.  Pain that gets worse with specific movements. DIAGNOSIS  Your health care provider will do a physical exam and ask about your symptoms. Chest X-rays or other tests may be done to rule out other problems. TREATMENT  Costochondritis usually goes away on its own over time. Your health care provider may prescribe medicine to help relieve pain. HOME CARE  INSTRUCTIONS   Avoid exhausting physical activity. Try not to strain your ribs during normal activity. This would include any activities using chest, abdominal, and side muscles, especially if heavy weights are used.  Apply ice to the affected area for the first 2 days after the pain begins.  Put ice in a plastic bag.  Place a towel between your skin and the bag.  Leave the ice on for 20 minutes, 2-3 times a day.  Only take over-the-counter or prescription medicines as directed by your health care provider. SEEK MEDICAL CARE IF:  You have redness or swelling at the rib joints. These are signs of infection.  Your pain does not go away despite rest or medicine. SEEK IMMEDIATE MEDICAL CARE IF:   Your pain increases or you are very uncomfortable.  You have shortness of breath or difficulty breathing.  You cough up blood.  You have worse chest pains, sweating, or vomiting.  You have a fever or persistent symptoms for more than 2-3 days.  You have a fever and your symptoms suddenly get worse. MAKE SURE YOU:   Understand these instructions.  Will watch your condition.  Will get help right away if you are not doing well or get worse.   This information is not intended to replace advice given to you by your health care provider. Make sure you discuss any questions you have with your health care provider.   Document Released: 03/23/2005 Document Revised: 04/03/2013 Document Reviewed: 01/15/2013 Elsevier Interactive Patient Education [employer].

## 2015-11-12 LAB — COMPREHENSIVE METABOLIC PANEL
ALBUMIN: 4.5 g/dL (ref 3.6–5.1)
ALT: 27 U/L (ref 6–29)
AST: 18 U/L (ref 10–30)
Alkaline Phosphatase: 53 U/L (ref 33–115)
BILIRUBIN TOTAL: 0.4 mg/dL (ref 0.2–1.2)
BUN: 7 mg/dL (ref 7–25)
CALCIUM: 10.2 mg/dL (ref 8.6–10.2)
CO2: 25 mmol/L (ref 20–31)
Chloride: 97 mmol/L — ABNORMAL LOW (ref 98–110)
Creat: 0.79 mg/dL (ref 0.50–1.10)
Glucose, Bld: 85 mg/dL (ref 65–99)
POTASSIUM: 3.3 mmol/L — AB (ref 3.5–5.3)
Sodium: 137 mmol/L (ref 135–146)
Total Protein: 7.9 g/dL (ref 6.1–8.1)

## 2015-11-13 ENCOUNTER — Emergency Department (HOSPITAL_COMMUNITY): Payer: Medicaid Other

## 2015-11-13 ENCOUNTER — Encounter (HOSPITAL_COMMUNITY): Payer: Self-pay | Admitting: Emergency Medicine

## 2015-11-13 ENCOUNTER — Emergency Department (HOSPITAL_COMMUNITY)
Admission: EM | Admit: 2015-11-13 | Discharge: 2015-11-14 | Disposition: A | Discharge status: Home / Self Care | Payer: Medicaid Other | Attending: Emergency Medicine | Admitting: Emergency Medicine

## 2015-11-13 DIAGNOSIS — Z7982 Long term (current) use of aspirin: Secondary | ICD-10-CM | POA: Insufficient documentation

## 2015-11-13 DIAGNOSIS — Z792 Long term (current) use of antibiotics: Secondary | ICD-10-CM | POA: Insufficient documentation

## 2015-11-13 DIAGNOSIS — K8020 Calculus of gallbladder without cholecystitis without obstruction: Secondary | ICD-10-CM | POA: Insufficient documentation

## 2015-11-13 DIAGNOSIS — I10 Essential (primary) hypertension: Secondary | ICD-10-CM | POA: Insufficient documentation

## 2015-11-13 DIAGNOSIS — R1012 Left upper quadrant pain: Secondary | ICD-10-CM | POA: Insufficient documentation

## 2015-11-13 DIAGNOSIS — R1011 Right upper quadrant pain: Secondary | ICD-10-CM

## 2015-11-13 DIAGNOSIS — E876 Hypokalemia: Secondary | ICD-10-CM | POA: Insufficient documentation

## 2015-11-13 DIAGNOSIS — Z87891 Personal history of nicotine dependence: Secondary | ICD-10-CM | POA: Insufficient documentation

## 2015-11-13 LAB — URINE MICROSCOPIC-ADD ON

## 2015-11-13 LAB — COMPREHENSIVE METABOLIC PANEL
ALK PHOS: 53 U/L (ref 38–126)
ALT: 42 U/L (ref 14–54)
ANION GAP: 11 (ref 5–15)
AST: 28 U/L (ref 15–41)
Albumin: 4.3 g/dL (ref 3.5–5.0)
BILIRUBIN TOTAL: 0.8 mg/dL (ref 0.3–1.2)
BUN: 7 mg/dL (ref 6–20)
CALCIUM: 9.4 mg/dL (ref 8.9–10.3)
CO2: 28 mmol/L (ref 22–32)
Chloride: 98 mmol/L — ABNORMAL LOW (ref 101–111)
Creatinine, Ser: 0.72 mg/dL (ref 0.44–1.00)
Glucose, Bld: 119 mg/dL — ABNORMAL HIGH (ref 65–99)
POTASSIUM: 2.6 mmol/L — AB (ref 3.5–5.1)
Sodium: 137 mmol/L (ref 135–145)
TOTAL PROTEIN: 7.9 g/dL (ref 6.5–8.1)

## 2015-11-13 LAB — URINALYSIS, ROUTINE W REFLEX MICROSCOPIC
BILIRUBIN URINE: NEGATIVE
Glucose, UA: NEGATIVE mg/dL
KETONES UR: NEGATIVE mg/dL
Nitrite: NEGATIVE
PH: 6.5 (ref 5.0–8.0)
Protein, ur: NEGATIVE mg/dL
SPECIFIC GRAVITY, URINE: 1.007 (ref 1.005–1.030)

## 2015-11-13 LAB — CBC
HEMATOCRIT: 42.1 % (ref 36.0–46.0)
HEMOGLOBIN: 14.3 g/dL (ref 12.0–15.0)
MCH: 30.1 pg (ref 26.0–34.0)
MCHC: 34 g/dL (ref 30.0–36.0)
MCV: 88.6 fL (ref 78.0–100.0)
Platelets: 364 10*3/uL (ref 150–400)
RBC: 4.75 MIL/uL (ref 3.87–5.11)
RDW: 12.5 % (ref 11.5–15.5)
WBC: 8.5 10*3/uL (ref 4.0–10.5)

## 2015-11-13 LAB — I-STAT BETA HCG BLOOD, ED (MC, WL, AP ONLY): I-stat hCG, quantitative: <5 m[IU]/mL (ref <5)

## 2015-11-13 LAB — LIPASE, BLOOD: Lipase: 33 U/L (ref 11–51)

## 2015-11-13 LAB — I-STAT TROPONIN, ED: Troponin i, poc: 0 ng/mL (ref 0.00–0.08)

## 2015-11-13 NOTE — ED Notes (Signed)
Pt c/o RUQ pain and pain between shoulder blades worse after eating. Also c/o lower abdominal pain, pain across lower back, and pain with defecating.

## 2015-11-13 NOTE — ED Provider Notes (Signed)
CSN: [id number]     Arrival date & time 11/13/15  1707 History  By signing my name below, I, Christina Fox, attest that this documentation has been prepared under the direction and in the presence of [name], PA-C Electronically Signed: Altamease Fox, ED Scribe. 11/13/2015 10:51 PM  Chief Complaint  Patient presents with  . Abdominal Pain  . Back Pain    The history is provided by the patient. No language interpreter was used.   Christina Fox is a 35 y.o. female with PMHx of GERD, kidney stones, and HTN who presents to the Emergency Department complaining of intermittent, 8/10 in severity, bilateral upper abdominal pain (R>L) with onset earlier this week. Protonix and Excedrin provided insufficient pain relief at home.  Associated symptoms include 2 weeks of daily chest pain after eating, belching, decreased appetite, nausea.She was seen by her PCP 2 days ago where she had an EKG, CXR, and abdominal XR. Her XR showed constipation and she was scheduled for more imaging 2 weeks from now. She has upper back pain but also notes lower back pain with urination and having a bowel movement.  Pt denies fever, vomiting, and black or tarry stool. She denies frequent NSAID use. Pt is a former smoker. No history of abdominal surgery.   Past Medical History  Diagnosis Date  . Headache(784.0)     sinus  . GERD (gastroesophageal reflux disease)   . Kidney stones     no current problem  . Anxiety   . Deviated septum 09/2011  . 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  . Complication of anesthesia     states small mouth  . Pelvic inflammatory disease   . Urinary tract infection   . Migraine   . Hypertension    Past Surgical History  Procedure Laterality Date  . Polyps removed      colon  . Wisdom tooth extraction    . Nasal septoplasty w/ turbinoplasty  10/04/2011    Procedure: NASAL SEPTOPLASTY WITH TURBINATE REDUCTION;  Surgeon: Ascencion Dike, MD;  Location: [facility];  Service: ENT;  Laterality: Bilateral;   Family History  Problem Relation Age of Onset  . Cancer Mother     renal cancer  . Hypertension Mother   . Migraines Mother   . Multiple sclerosis Mother   . Hypertension Father   . Liver disease Father   . Spina bifida Brother   . Hypertension Other   . Migraines Maternal Grandmother    Social History  Substance Use Topics  . Smoking status: Former Smoker -- 0.25 packs/day    Quit date: 08/23/2009  . Smokeless tobacco: Never Used  . Alcohol Use: No   OB History    Gravida Para Term Preterm AB TAB SAB Ectopic Multiple Living   4 4 4       4      Review of Systems  Constitutional: Positive for appetite change. Negative for fever.  Cardiovascular: Positive for chest pain.  Gastrointestinal: Positive for nausea and abdominal pain. Negative for vomiting and diarrhea.       Belching  Musculoskeletal: Positive for back pain.      Allergies  Macrobid and Sulfa antibiotics  Home Medications   Prior to Admission medications   Medication Sig Start Date End Date Taking? Authorizing Provider  acetaminophen (TYLENOL) 500 MG tablet Take 1,000 mg by mouth every 6 (six) hours as needed for mild pain, moderate pain, fever or headache. Reported on  10/20/2015   Yes Historical Provider, MD  aspirin-acetaminophen-caffeine (EXCEDRIN MIGRAINE) [id number] MG tablet Take 1 tablet by mouth every 6 (six) hours as needed for headache.   Yes Historical Provider, MD  clonazePAM (KLONOPIN) 0.5 MG tablet Take 1 tablet (0.5 mg total) by mouth daily. 10/20/15  Yes Robyn Haber, MD  hydrochlorothiazide (HYDRODIURIL) 25 MG tablet Take 1 tablet (25 mg total) by mouth daily. 10/27/15  Yes Maren Reamer, MD  levonorgestrel-ethinyl estradiol (AUBRA) 0.1-20 MG-MCG tablet Take 1 tablet by mouth daily. Reported on 11/09/2015   Yes Historical Provider, MD  methocarbamol (ROBAXIN) 500 MG tablet TAKE 1 TABLET BY MOUTH 3 TIMES A DAY BETWEEN MEALS AS NEEDED 10/31/15   Yes Historical Provider, MD  montelukast (SINGULAIR) 10 MG tablet TAKE 1 TABLET BY MOUTH DAILY 09/07/15  Yes Lance Bosch, NP  pantoprazole (PROTONIX) 40 MG tablet Take 1 tablet (40 mg total) by mouth daily. 10/27/15  Yes Maren Reamer, MD  potassium chloride (K-DUR,KLOR-CON) 20 MEQ tablet Take 1 tablet (20 mEq total) by mouth daily. Start after finish ER kdur course. 11/11/15  Yes Wardell Honour, MD   BP 148/95 mmHg  Pulse 87  Temp(Src) 97.8 F (36.6 C) (Oral)  Resp 18  SpO2 99% Physical Exam  Constitutional: She is oriented to person, place, and time. She appears well-developed and well-nourished. No distress.  HENT:  Head: Normocephalic and atraumatic.  Eyes: Conjunctivae and EOM are normal.  Neck: Neck supple. No tracheal deviation present.  Cardiovascular: Normal rate.   Pulmonary/Chest: Effort normal and breath sounds normal. No respiratory distress.  Abdominal: Soft. There is tenderness. There is no guarding.  Obese abdomen  Diffusely tender with greater tenderness in the RUQ Bowel sounds active   Musculoskeletal: Normal range of motion.  Neurological: She is alert and oriented to person, place, and time.  Skin: Skin is warm and dry.  Psychiatric: She has a normal mood and affect. Her behavior is normal.  Nursing note and vitals reviewed.   ED Course  Procedures (including critical care time) DIAGNOSTIC STUDIES: Oxygen Saturation is 99% on RA,  normal by my interpretation.    COORDINATION OF CARE: 10:46 PM Discussed treatment plan which includes lab work, RUQ [place], EKG with pt at bedside and pt agreed to plan.  Labs Review Labs Reviewed  COMPREHENSIVE METABOLIC PANEL - Abnormal; Notable for the following:    Potassium 2.6 (*)    Chloride 98 (*)    Glucose, Bld 119 (*)    All other components within normal limits  URINALYSIS, ROUTINE W REFLEX MICROSCOPIC (NOT AT [facility]) - Abnormal; Notable for the following:    APPearance CLOUDY (*)    Hgb urine dipstick SMALL (*)     Leukocytes, UA SMALL (*)    All other components within normal limits  URINE MICROSCOPIC-ADD ON - Abnormal; Notable for the following:    Squamous Epithelial / LPF 0-5 (*)    Bacteria, UA RARE (*)    All other components within normal limits  LIPASE, BLOOD  CBC  I-STAT BETA HCG BLOOD, ED ([facility], [facility], [facility] ONLY)  I-STAT TROPOININ, ED   Results for orders placed or performed during the hospital encounter of 11/13/15  Lipase, blood  Result Value Ref Range   Lipase 33 11 - 51 U/L  Comprehensive metabolic panel  Result Value Ref Range   Sodium 137 135 - 145 mmol/L   Potassium 2.6 (LL) 3.5 - 5.1 mmol/L   Chloride 98 (L) 101 - 111 mmol/L  CO2 28 22 - 32 mmol/L   Glucose, Bld 119 (H) 65 - 99 mg/dL   BUN 7 6 - 20 mg/dL   Creatinine, Ser 0.72 0.44 - 1.00 mg/dL   Calcium 9.4 8.9 - 10.3 mg/dL   Total Protein 7.9 6.5 - 8.1 g/dL   Albumin 4.3 3.5 - 5.0 g/dL   AST 28 15 - 41 U/L   ALT 42 14 - 54 U/L   Alkaline Phosphatase 53 38 - 126 U/L   Total Bilirubin 0.8 0.3 - 1.2 mg/dL   GFR calc non Af Amer >60 >60 mL/min   GFR calc Af Amer >60 >60 mL/min   Anion gap 11 5 - 15  CBC  Result Value Ref Range   WBC 8.5 4.0 - 10.5 K/uL   RBC 4.75 3.87 - 5.11 MIL/uL   Hemoglobin 14.3 12.0 - 15.0 g/dL   HCT 42.1 36.0 - 46.0 %   MCV 88.6 78.0 - 100.0 fL   MCH 30.1 26.0 - 34.0 pg   MCHC 34.0 30.0 - 36.0 g/dL   RDW 12.5 11.5 - 15.5 %   Platelets 364 150 - 400 K/uL  Urinalysis, Routine w reflex microscopic  Result Value Ref Range   Color, Urine YELLOW YELLOW   APPearance CLOUDY (A) CLEAR   Specific Gravity, Urine 1.007 1.005 - 1.030   pH 6.5 5.0 - 8.0   Glucose, UA NEGATIVE NEGATIVE mg/dL   Hgb urine dipstick SMALL (A) NEGATIVE   Bilirubin Urine NEGATIVE NEGATIVE   Ketones, ur NEGATIVE NEGATIVE mg/dL   Protein, ur NEGATIVE NEGATIVE mg/dL   Nitrite NEGATIVE NEGATIVE   Leukocytes, UA SMALL (A) NEGATIVE  Urine microscopic-add on  Result Value Ref Range   Squamous Epithelial / LPF 0-5 (A) NONE SEEN    WBC, UA 6-30 0 - 5 WBC/hpf   RBC / HPF 0-5 0 - 5 RBC/hpf   Bacteria, UA RARE (A) NONE SEEN  I-Stat beta hCG blood, ED  Result Value Ref Range   I-stat hCG, quantitative <5.0 <5 mIU/mL   Comment 3          I-Stat Troponin, ED (not at [facility])  Result Value Ref Range   Troponin i, poc 0.00 0.00 - 0.08 ng/mL   Comment 3           Dg Chest 2 View  11/04/2015  CLINICAL DATA:  Chest pain, hypertension EXAM: CHEST  2 VIEW COMPARISON:  10/15/2015 FINDINGS: The heart size and mediastinal contours are within normal limits. Both lungs are clear. The visualized skeletal structures are unremarkable. IMPRESSION: No active cardiopulmonary disease. Electronically Signed   By: Kathreen Devoid   On: 11/04/2015 16:58   Ct Abdomen Pelvis W Contrast  11/14/2015  CLINICAL DATA:  Pt seen here last night left at approximately 0100. Pt had a baked potato and chicken nuggets after her visit, states she woke up and had diarrhea x 3 w/ streaks of blood present. Pt also c/o feeling, shaky, tingling all over body and all together, "not right." (negative occult blood)Discharged earlier with complaint of intermittent, 8/10 in severity, bilateral upper abdominal pain (R>L) with onset earlier this week. Protonix and Excedrin provided insufficient pain relief at home. EXAM: CT ABDOMEN AND PELVIS WITH CONTRAST TECHNIQUE: Multidetector CT imaging of the abdomen and pelvis was performed using the standard protocol following bolus administration of intravenous contrast. CONTRAST:  180mL ISOVUE-300 IOPAMIDOL (ISOVUE-300) INJECTION 61% COMPARISON:  08/24/2015 and 05/14/2015 FINDINGS: Lung bases:  Clear.  Heart normal in size.  Liver, spleen, gallbladder, pancreas, adrenal glands:  Normal. Kidneys, ureters, bladder: Small stable stone in the midpole the right kidney, nonobstructing. No other intrarenal stones. No renal masses. No hydronephrosis. Normal ureters. Normal bladder. Uterus and adnexa:  Unremarkable. Lymph nodes:  No adenopathy.  Ascites:  None. Gastrointestinal:  Normal.  Normal appendix visualized. Musculoskeletal: Mild disc degenerative change at L4-L5. No other abnormality. IMPRESSION: 1. No acute findings. No findings to account for this patient's symptoms. 2. Small stable nonobstructing stone in the midpole the right kidney. Mild disc degenerative change at L4-L5. No other abnormalities. Electronically Signed   By: Lajean Manes M.D.   On: 11/14/2015 09:03   Dg Abd Acute W/chest  11/11/2015  CLINICAL DATA:  Abdominal pain.  Chest pain . EXAM: DG ABDOMEN ACUTE W/ 1V CHEST COMPARISON:  11/04/2015.  CT 08/24/2015. FINDINGS: No acute cardiopulmonary disease. Soft tissue structures are unremarkable. Several nondilated air-filled loops of small bowel are noted. Colonic gas pattern is nonspecific. No free air. Follow-up abdominal series can be obtained to exclude developing adynamic ileus or bowel obstruction. Thoracolumbar spine scoliosis. IMPRESSION: 1. Several nondilated air-filled loops of small bowel noted. Colonic gas pattern is normal. These findings are nonspecific at follow-up abdominal series can be obtained to exclude developing adynamic ileus or bowel obstruction . 2.  No acute cardiopulmonary disease. Electronically Signed   By: Melvin   On: 11/11/2015 17:19   US Abdomen Limited Ruq  11/13/2015  CLINICAL DATA:  Right upper quadrant pain radiating to the shoulders. EXAM: US ABDOMEN LIMITED - RIGHT UPPER QUADRANT COMPARISON:  CT abdomen and pelvis 08/24/2015 FINDINGS: Gallbladder: Gallbladder is contracted, likely physiologic. Small stones are demonstrated in the gallbladder. No gallbladder wall thickening or edema. Murphy's sign is negative. Common bile duct: Diameter: 3.8 mm, normal Liver: Mild increased liver parenchymal echotexture suggesting possible fatty infiltration. No focal liver lesions identified. IMPRESSION: Cholelithiasis.  No additional changes suggesting cholecystitis. Electronically Signed   By:  Lucienne Capers M.D.   On: 11/13/2015 23:34     Imaging Review No results found. I have personally reviewed and evaluated these images and lab results as part of my medical decision-making.   EKG Interpretation None      MDM   Final diagnoses:  RUQ pain    1. Cholelithiasis without cholecystitis  Patient with chronic complaints presents with RUQ pain associated with eating. Normal lab studies with the exception of hypokalemia, which the patient is aware of and on supplements for. Additional supplementation provided.  [place] abd shows gall stones wtihout evidence cholecystitis. She is stable for discharge home and strongly encouraged to see her primary care provider for further management.   I personally performed the services described in this documentation, which was scribed in my presence. The recorded information has been reviewed and is accurate.     [name], PA-C 11/15/15 2026  Wandra Arthurs, MD 11/16/15 [phone]

## 2015-11-13 NOTE — ED Notes (Signed)
Potassium - 2.6  Notified MD

## 2015-11-14 ENCOUNTER — Emergency Department (HOSPITAL_COMMUNITY)
Admission: EM | Admit: 2015-11-14 | Discharge: 2015-11-14 | Disposition: A | Discharge status: Home / Self Care | Payer: Self-pay | Attending: Emergency Medicine | Admitting: Emergency Medicine

## 2015-11-14 ENCOUNTER — Encounter (HOSPITAL_COMMUNITY): Payer: Self-pay | Admitting: Oncology

## 2015-11-14 ENCOUNTER — Emergency Department (HOSPITAL_COMMUNITY): Payer: Self-pay

## 2015-11-14 DIAGNOSIS — Z7982 Long term (current) use of aspirin: Secondary | ICD-10-CM | POA: Insufficient documentation

## 2015-11-14 DIAGNOSIS — Z87891 Personal history of nicotine dependence: Secondary | ICD-10-CM | POA: Insufficient documentation

## 2015-11-14 DIAGNOSIS — Z79899 Other long term (current) drug therapy: Secondary | ICD-10-CM | POA: Insufficient documentation

## 2015-11-14 DIAGNOSIS — I10 Essential (primary) hypertension: Secondary | ICD-10-CM | POA: Insufficient documentation

## 2015-11-14 DIAGNOSIS — R1013 Epigastric pain: Secondary | ICD-10-CM | POA: Insufficient documentation

## 2015-11-14 DIAGNOSIS — E876 Hypokalemia: Secondary | ICD-10-CM | POA: Insufficient documentation

## 2015-11-14 LAB — CBC WITH DIFFERENTIAL/PLATELET
BASOS ABS: 0.1 10*3/uL (ref 0.0–0.1)
Basophils Relative: 1 %
EOS PCT: 4 %
Eosinophils Absolute: 0.3 10*3/uL (ref 0.0–0.7)
HCT: 40.7 % (ref 36.0–46.0)
Hemoglobin: 14.4 g/dL (ref 12.0–15.0)
LYMPHS PCT: 23 %
Lymphs Abs: 1.8 10*3/uL (ref 0.7–4.0)
MCH: 30.3 pg (ref 26.0–34.0)
MCHC: 35.4 g/dL (ref 30.0–36.0)
MCV: 85.7 fL (ref 78.0–100.0)
MONO ABS: 0.7 10*3/uL (ref 0.1–1.0)
Monocytes Relative: 9 %
Neutro Abs: 5.2 10*3/uL (ref 1.7–7.7)
Neutrophils Relative %: 63 %
PLATELETS: 320 10*3/uL (ref 150–400)
RBC: 4.75 MIL/uL (ref 3.87–5.11)
RDW: 12.3 % (ref 11.5–15.5)
WBC: 8.1 10*3/uL (ref 4.0–10.5)

## 2015-11-14 LAB — POC OCCULT BLOOD, ED: Fecal Occult Bld: NEGATIVE

## 2015-11-14 LAB — BASIC METABOLIC PANEL
ANION GAP: 10 (ref 5–15)
BUN: 8 mg/dL (ref 6–20)
CALCIUM: 9.3 mg/dL (ref 8.9–10.3)
CO2: 28 mmol/L (ref 22–32)
CREATININE: 0.74 mg/dL (ref 0.44–1.00)
Chloride: 99 mmol/L — ABNORMAL LOW (ref 101–111)
GFR calc Af Amer: >60 mL/min (ref >60)
GLUCOSE: 101 mg/dL — AB (ref 65–99)
Potassium: 3 mmol/L — ABNORMAL LOW (ref 3.5–5.1)
Sodium: 137 mmol/L (ref 135–145)

## 2015-11-14 MED ORDER — SUCRALFATE 1 G PO TABS: 1.0000 g | ORAL_TABLET | Freq: Four times a day (QID) | ORAL | Status: DC

## 2015-11-14 MED ORDER — POTASSIUM CHLORIDE CRYS ER 20 MEQ PO TBCR
40.0000 meq | EXTENDED_RELEASE_TABLET | Freq: Once | ORAL | Status: AC
  Administered 2015-11-14: 40 meq via ORAL
  Filled 2015-11-14: qty 2

## 2015-11-14 MED ORDER — DIATRIZOATE MEGLUMINE & SODIUM 66-10 % PO SOLN
15.0000 mL | Freq: Once | ORAL | Status: AC
  Administered 2015-11-14: 15 mL via ORAL

## 2015-11-14 MED ORDER — IOPAMIDOL (ISOVUE-300) INJECTION 61%
100.0000 mL | Freq: Once | INTRAVENOUS | Status: AC | PRN
  Administered 2015-11-14: 100 mL via INTRAVENOUS

## 2015-11-14 MED ORDER — SODIUM CHLORIDE 0.9 % IV SOLN
INTRAVENOUS | Status: DC
  Administered 2015-11-14: 08:00:00 via INTRAVENOUS

## 2015-11-14 MED ORDER — POTASSIUM CHLORIDE 10 MEQ/100ML IV SOLN: 10.0000 meq | Freq: Once | INTRAVENOUS | Status: DC

## 2015-11-14 MED ORDER — ONDANSETRON HCL 4 MG/2ML IJ SOLN
4.0000 mg | Freq: Once | INTRAMUSCULAR | Status: AC
  Administered 2015-11-14: 4 mg via INTRAVENOUS
  Filled 2015-11-14: qty 2

## 2015-11-14 MED ORDER — TRAMADOL HCL 50 MG PO TABS: 50.0000 mg | ORAL_TABLET | Freq: Four times a day (QID) | ORAL | Status: DC | PRN

## 2015-11-14 NOTE — ED Notes (Signed)
Delay in IV start and blood draw, patient needed to use the restroom.

## 2015-11-14 NOTE — Discharge Instructions (Signed)
Hypokalemia Hypokalemia means that the amount of potassium in the blood is lower than normal.Potassium is a chemical, called an electrolyte, that helps regulate the amount of fluid in the body. It also stimulates muscle contraction and helps nerves function properly.Most of the body's potassium is inside of cells, and only a very small amount is in the blood. Because the amount in the blood is so small, minor changes can be life-threatening. CAUSES  Antibiotics.  Diarrhea or vomiting.  Using laxatives too much, which can cause diarrhea.  Chronic kidney disease.  Water pills (diuretics).  Eating disorders (bulimia).  Low magnesium level.  Sweating a lot. SIGNS AND SYMPTOMS  Weakness.  Constipation.  Fatigue.  Muscle cramps.  Mental confusion.  Skipped heartbeats or irregular heartbeat (palpitations).  Tingling or numbness. DIAGNOSIS  Your health care provider can diagnose hypokalemia with blood tests. In addition to checking your potassium level, your health care provider may also check other lab tests. TREATMENT Hypokalemia can be treated with potassium supplements taken by mouth or adjustments in your current medicines. If your potassium level is very low, you may need to get potassium through a vein (IV) and be monitored in the hospital. A diet high in potassium is also helpful. Foods high in potassium are:  Nuts, such as peanuts and pistachios.  Seeds, such as sunflower seeds and pumpkin seeds.  Peas, lentils, and lima beans.  Whole grain and bran cereals and breads.  Fresh fruit and vegetables, such as apricots, avocado, bananas, cantaloupe, kiwi, oranges, tomatoes, asparagus, and potatoes.  Orange and tomato juices.  Red meats.  Fruit yogurt. HOME CARE INSTRUCTIONS  Take all medicines as prescribed by your health care provider.  Maintain a healthy diet by including nutritious food, such as fruits, vegetables, nuts, whole grains, and lean meats.  If  you are taking a laxative, be sure to follow the directions on the label. SEEK MEDICAL CARE IF:  Your weakness gets worse.  You feel your heart pounding or racing.  You are vomiting or having diarrhea.  You are diabetic and having trouble keeping your blood glucose in the normal range. SEEK IMMEDIATE MEDICAL CARE IF:  You have chest pain, shortness of breath, or dizziness.  You are vomiting or having diarrhea for more than 2 days.  You faint. MAKE SURE YOU:   Understand these instructions.  Will watch your condition.  Will get help right away if you are not doing well or get worse.   This information is not intended to replace advice given to you by your health care provider. Make sure you discuss any questions you have with your health care provider.   Document Released: 06/13/2005 Document Revised: 07/04/2014 Document Reviewed: 12/14/2012 Elsevier Interactive Patient Education 2016 [employer]. Cholelithiasis Cholelithiasis (also called gallstones) is a form of gallbladder disease in which gallstones form in your gallbladder. The gallbladder is an organ that stores bile made in the liver, which helps digest fats. Gallstones begin as small crystals and slowly grow into stones. Gallstone pain occurs when the gallbladder spasms and a gallstone is blocking the duct. Pain can also occur when a stone passes out of the duct.  RISK FACTORS  Being female.   Having multiple pregnancies. Health care providers sometimes advise removing diseased gallbladders before future pregnancies.   Being obese.  Eating a diet heavy in fried foods and fat.   Being older than 56 years and increasing age.   Prolonged use of medicines containing female hormones.   Having diabetes  mellitus.   Rapidly losing weight.   Having a family history of gallstones (heredity).  SYMPTOMS  Nausea.   Vomiting.  Abdominal pain.   Yellowing of the skin (jaundice).   Sudden pain. It may  persist from several minutes to several hours.  Fever.   Tenderness to the touch. In some cases, when gallstones do not move into the bile duct, people have no pain or symptoms. These are called "silent" gallstones.  TREATMENT Silent gallstones do not need treatment. In severe cases, emergency surgery may be required. Options for treatment include:  Surgery to remove the gallbladder. This is the most common treatment.  Medicines. These do not always work and may take 6-12 months or more to work.  Shock wave treatment (extracorporeal biliary lithotripsy). In this treatment an ultrasound machine sends shock waves to the gallbladder to break gallstones into smaller pieces that can pass into the intestines or be dissolved by medicine. HOME CARE INSTRUCTIONS   Only take over-the-counter or prescription medicines for pain, discomfort, or fever as directed by your health care provider.   Follow a low-fat diet until seen again by your health care provider. Fat causes the gallbladder to contract, which can result in pain.   Follow up with your health care provider as directed. Attacks are almost always recurrent and surgery is usually required for permanent treatment.  SEEK IMMEDIATE MEDICAL CARE IF:   Your pain increases and is not controlled by medicines.   You have a fever or persistent symptoms for more than 2-3 days.   You have a fever and your symptoms suddenly get worse.   You have persistent nausea and vomiting.  MAKE SURE YOU:   Understand these instructions.  Will watch your condition.  Will get help right away if you are not doing well or get worse.   This information is not intended to replace advice given to you by your health care provider. Make sure you discuss any questions you have with your health care provider.   Document Released: 06/09/2005 Document Revised: 02/13/2013 Document Reviewed: 12/05/2012 Elsevier Interactive Patient Education [employer].

## 2015-11-14 NOTE — Discharge Instructions (Signed)

## 2015-11-14 NOTE — ED Notes (Signed)
Patient assisted with ambulation to the restroom. Placed back on cardiac monitor.

## 2015-11-14 NOTE — ED Notes (Signed)
Pt seen here last night left at approximately 0100.  Pt had a baked potato and chicken nuggets after her visit, states she woke up and had diarrhea x 3 w/ streaks of blood present.  Pt also c/o feeling, shaky, tingling all over body and all together, "not right."

## 2015-11-14 NOTE — ED Provider Notes (Signed)
CSN: [id number]     Arrival date & time 11/14/15  [id number] History   First MD Initiated Contact with Patient 11/14/15 0710     Chief Complaint  Patient presents with  . Diarrhea     (Consider location/radiation/quality/duration/timing/severity/associated sxs/prior Treatment) HPI Comments: Sx started after she ate food Denies blood clots Seen here for abd pain earlier in the evening and that w/u reviewed  Patient is a 35 y.o. female presenting with diarrhea. The history is provided by the patient.  Diarrhea Quality:  Blood-tinged Severity:  Mild Onset quality:  Sudden Duration:  2 hours Timing:  Constant Progression:  Partially resolved Relieved by:  Nothing Worsened by:  Nothing tried Ineffective treatments:  None tried Associated symptoms: abdominal pain   Associated symptoms: no fever and no vomiting     Past Medical History  Diagnosis Date  . Headache(784.0)     sinus  . GERD (gastroesophageal reflux disease)   . Kidney stones     no current problem  . Anxiety   . Deviated septum 09/2011  . 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  . Complication of anesthesia     states small mouth  . Pelvic inflammatory disease   . Urinary tract infection   . Migraine   . Hypertension    Past Surgical History  Procedure Laterality Date  . Polyps removed      colon  . Wisdom tooth extraction    . Nasal septoplasty w/ turbinoplasty  10/04/2011    Procedure: NASAL SEPTOPLASTY WITH TURBINATE REDUCTION;  Surgeon: Ascencion Dike, MD;  Location: [facility];  Service: ENT;  Laterality: Bilateral;   Family History  Problem Relation Age of Onset  . Cancer Mother     renal cancer  . Hypertension Mother   . Migraines Mother   . Multiple sclerosis Mother   . Hypertension Father   . Liver disease Father   . Spina bifida Brother   . Hypertension Other   . Migraines Maternal Grandmother    Social History  Substance Use Topics  . Smoking status: Former Smoker -- 0.25  packs/day    Quit date: 08/23/2009  . Smokeless tobacco: Never Used  . Alcohol Use: No   OB History    Gravida Para Term Preterm AB TAB SAB Ectopic Multiple Living   4 4 4       4      Review of Systems  Constitutional: Negative for fever.  Gastrointestinal: Positive for abdominal pain and diarrhea. Negative for vomiting.  All other systems reviewed and are negative.     Allergies  Macrobid and Sulfa antibiotics  Home Medications   Prior to Admission medications   Medication Sig Start Date End Date Taking? Authorizing Provider  acetaminophen (TYLENOL) 500 MG tablet Take 1,000 mg by mouth every 6 (six) hours as needed for mild pain, moderate pain, fever or headache. Reported on 10/20/2015    Historical Provider, MD  aspirin-acetaminophen-caffeine (EXCEDRIN MIGRAINE) [phone] MG tablet Take 1 tablet by mouth every 6 (six) hours as needed for headache.    Historical Provider, MD  clonazePAM (KLONOPIN) 0.5 MG tablet Take 1 tablet (0.5 mg total) by mouth daily. 10/20/15   Robyn Haber, MD  hydrochlorothiazide (HYDRODIURIL) 25 MG tablet Take 1 tablet (25 mg total) by mouth daily. 10/27/15   Maren Reamer, MD  levonorgestrel-ethinyl estradiol (AUBRA) 0.1-20 MG-MCG tablet Take 1 tablet by mouth daily. Reported on 11/09/2015    Historical Provider, MD  methocarbamol (  ROBAXIN) 500 MG tablet TAKE 1 TABLET BY MOUTH 3 TIMES A DAY BETWEEN MEALS AS NEEDED 10/31/15   Historical Provider, MD  montelukast (SINGULAIR) 10 MG tablet TAKE 1 TABLET BY MOUTH DAILY 09/07/15   Lance Bosch, NP  pantoprazole (PROTONIX) 40 MG tablet Take 1 tablet (40 mg total) by mouth daily. 10/27/15   Maren Reamer, MD  potassium chloride (K-DUR,KLOR-CON) 20 MEQ tablet Take 1 tablet (20 mEq total) by mouth daily. Start after finish ER kdur course. 11/11/15   Wardell Honour, MD  traMADol (ULTRAM) 50 MG tablet Take 1 tablet (50 mg total) by mouth every 6 (six) hours as needed. 11/14/15   Shari Upstill, PA-C   BP 144/86 mmHg   Pulse 104  Temp(Src) 97.9 F (36.6 C) (Oral)  Resp 22  SpO2 100% Physical Exam  Constitutional: She is oriented to person, place, and time. She appears well-developed and well-nourished.  Non-toxic appearance. No distress.  HENT:  Head: Normocephalic and atraumatic.  Eyes: Conjunctivae, EOM and lids are normal. Pupils are equal, round, and reactive to light.  Neck: Normal range of motion. Neck supple. No tracheal deviation present. No thyroid mass present.  Cardiovascular: Normal rate, regular rhythm and normal heart sounds.  Exam reveals no gallop.   No murmur heard. Pulmonary/Chest: Effort normal and breath sounds normal. No stridor. No respiratory distress. She has no decreased breath sounds. She has no wheezes. She has no rhonchi. She has no rales.  Abdominal: Soft. Normal appearance and bowel sounds are normal. She exhibits no distension. There is no tenderness. There is no rigidity, no rebound, no guarding and no CVA tenderness.  Genitourinary: Rectal exam shows anal tone normal.  Musculoskeletal: Normal range of motion. She exhibits no edema or tenderness.  Neurological: She is alert and oriented to person, place, and time. She has normal strength. No cranial nerve deficit or sensory deficit. GCS eye subscore is 4. GCS verbal subscore is 5. GCS motor subscore is 6.  Skin: Skin is warm and dry. No abrasion and no rash noted.  Psychiatric: She has a normal mood and affect. Her speech is normal and behavior is normal.  Nursing note and vitals reviewed.   ED Course  Procedures (including critical care time) Labs Review Labs Reviewed  CBC WITH DIFFERENTIAL/PLATELET  BASIC METABOLIC PANEL  POC OCCULT BLOOD, ED    Imaging Review US Abdomen Limited Ruq  11/13/2015  CLINICAL DATA:  Right upper quadrant pain radiating to the shoulders. EXAM: US ABDOMEN LIMITED - RIGHT UPPER QUADRANT COMPARISON:  CT abdomen and pelvis 08/24/2015 FINDINGS: Gallbladder: Gallbladder is contracted, likely  physiologic. Small stones are demonstrated in the gallbladder. No gallbladder wall thickening or edema. Murphy's sign is negative. Common bile duct: Diameter: 3.8 mm, normal Liver: Mild increased liver parenchymal echotexture suggesting possible fatty infiltration. No focal liver lesions identified. IMPRESSION: Cholelithiasis.  No additional changes suggesting cholecystitis. Electronically Signed   By: Lucienne Capers M.D.   On: 11/13/2015 23:34   I have personally reviewed and evaluated these images and lab results as part of my medical decision-making.   EKG Interpretation None      MDM   Final diagnoses:  None    Patient given potassium here and is already on oral potassium. When compared to her prior value has improved. Abdominal CT without acute findings. Instructed to follow-up with a GI physician. Will place on Carafate    Lacretia Leigh, MD 11/14/15 [phone]

## 2015-11-16 ENCOUNTER — Ambulatory Visit (INDEPENDENT_AMBULATORY_CARE_PROVIDER_SITE_OTHER): Payer: Self-pay | Admitting: Gastroenterology

## 2015-11-16 ENCOUNTER — Encounter: Payer: Self-pay | Admitting: Gastroenterology

## 2015-11-16 ENCOUNTER — Telehealth: Payer: Self-pay | Admitting: Internal Medicine

## 2015-11-16 ENCOUNTER — Other Ambulatory Visit: Payer: Self-pay

## 2015-11-16 VITALS — BP 132/85 | HR 91 | Temp 98.3°F | Ht 62.0 in | Wt 187.0 lb

## 2015-11-16 DIAGNOSIS — R109 Unspecified abdominal pain: Secondary | ICD-10-CM

## 2015-11-16 NOTE — Patient Instructions (Signed)
We have ordered a special scan of your gallbladder.  I will try to get the results from the colonoscopy. You will likely need this updated, along with an upper endoscopy.   Further recommendations to follow!

## 2015-11-16 NOTE — Progress Notes (Signed)
Primary Care Physician:  Maren Reamer, MD Primary Gastroenterologist:  Dr. Oneida Alar   Chief Complaint  Patient presents with  . Abdominal Pain  . Constipation  . hypokalemia    HPI:   Christina Fox is a 35 y.o. female presenting today at the request of her PCP secondary to evaluation for abdominal pain, GERD. She has been to the ED 20 times in the past 6 months.    Has chest pain that radiates into left shoulder blade. Has no appetite. Sometimes after eating, chest will hurt for several hours. Doesn't feel like typical heartburn, indigestion. Dealing with hypokalemia. No vomiting but does feel nauseated. Protonix once daily. Has had 2 episodes where she feels like food is not going all the way down her esophagus. Excedrin for migraines.   Used to weigh 208 per patient's report and today weighs 187. Upon review of epic, she weighed 197 in Oct 2016. Has missed a lot of work due to illness. Mother present. Has a 58-year-old daughter. Remote colonoscopy, about 13 years ago at [facility]. States she had polyps at that time. States her baseline bowel habits would be about 3 times a day. Lately, she has had a sensation of lower back pain after having a BM, lower abdominal discomfort, both RLQ and LLQ. Heme negative. Small, [place] stool scale # 1 this morning, over the weekend had several watery stools, ranges all over the [facility] chart. No overt GI bleeding. Has gas, gurgling in stomach.   CT Nov 14, 2015 without acute findings. Started HCTZ on May 2nd. Not taking anymore, as she feels this worsened chronic hypokalemia. Has taken Protonix since May 2nd.     Past Medical History  Diagnosis Date  . Headache(784.0)     sinus  . GERD (gastroesophageal reflux disease)   . Kidney stones     no current problem  . Anxiety   . Deviated septum 09/2011  . 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  . Complication of anesthesia     states small mouth  . Pelvic inflammatory disease   .  Urinary tract infection   . Migraine   . Hypertension     Past Surgical History  Procedure Laterality Date  . Colonoscopy      remote past  . Wisdom tooth extraction    . Nasal septoplasty w/ turbinoplasty  10/04/2011    Procedure: NASAL SEPTOPLASTY WITH TURBINATE REDUCTION;  Surgeon: Ascencion Dike, MD;  Location: [place];  Service: ENT;  Laterality: Bilateral;    Current Outpatient Prescriptions  Medication Sig Dispense Refill  . acetaminophen (TYLENOL) 500 MG tablet Take 1,000 mg by mouth every 6 (six) hours as needed for mild pain, moderate pain, fever or headache. Reported on 10/20/2015    . clonazePAM (KLONOPIN) 0.5 MG tablet Take 1 tablet (0.5 mg total) by mouth daily. 30 tablet 5  . hydrochlorothiazide (HYDRODIURIL) 25 MG tablet Take 1 tablet (25 mg total) by mouth daily. 90 tablet 3  . levonorgestrel-ethinyl estradiol (AUBRA) 0.1-20 MG-MCG tablet Take 1 tablet by mouth daily. Reported on 11/09/2015    . montelukast (SINGULAIR) 10 MG tablet TAKE 1 TABLET BY MOUTH DAILY 30 tablet 3  . pantoprazole (PROTONIX) 40 MG tablet Take 1 tablet (40 mg total) by mouth daily. 30 tablet 3  . potassium chloride (K-DUR,KLOR-CON) 20 MEQ tablet Take 1 tablet (20 mEq total) by mouth daily. Start after finish ER kdur course. 30 tablet 5  . sucralfate (  CARAFATE) 1 g tablet Take 1 tablet (1 g total) by mouth 4 (four) times daily. 30 tablet 0  . traMADol (ULTRAM) 50 MG tablet Take 1 tablet (50 mg total) by mouth every 6 (six) hours as needed. 15 tablet 0  . methocarbamol (ROBAXIN) 500 MG tablet Reported on 11/16/2015  0  . [DISCONTINUED] escitalopram (LEXAPRO) 10 MG tablet Take 10 mg by mouth daily.      . [DISCONTINUED] ipratropium (ATROVENT) 0.06 % nasal spray Place 2 sprays into both nostrils 4 (four) times daily. (Patient not taking: Reported on 05/14/2015) 15 mL 1   No current facility-administered medications for this visit.    Allergies as of 11/16/2015 - Review Complete 11/16/2015    Allergen Reaction Noted  . Macrobid [nitrofurantoin monohyd macro] Rash 01/15/2015  . Sulfa antibiotics Hives and Rash 03/02/2011    Family History  Problem Relation Age of Onset  . Cancer Mother     renal cancer  . Hypertension Mother   . Migraines Mother   . Multiple sclerosis Mother   . Hypertension Father   . Liver disease Father     liver transplant; had fatty liver disease  . Spina bifida Brother   . Hypertension Other   . Migraines Maternal Grandmother   . Colon cancer Paternal Grandfather   . Fibromyalgia Mother   . Liver disease Mother     NASH     Social History   Social History  . Marital Status: Single    Spouse Name: N/A  . Number of Children: 4  . Years of Education: 11   Occupational History  . 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 Smoking status: Former Smoker -- 0.25 packs/day    Quit date: 08/23/2009  . Smokeless tobacco: Never Used  . Alcohol Use: No  . Drug Use: No  . Sexual Activity: Not Currently    Birth Control/ Protection: Pill   Other Topics Concern  . Not on file   Social History Narrative   Lives at home w/ her children   Right-handed   Drinks occasional sweet tea    Review of Systems: As mentioned in HPI.   Physical Exam: BP 132/85 mmHg  Pulse 91  Temp(Src) 98.3 F (36.8 C)  Ht 5\' 2"  (1.575 m)  Wt 187 lb (84.823 kg)  BMI 34.19 kg/m2 General:   Alert and oriented. Appears anxious.  Head:  Normocephalic and atraumatic. Eyes:  Without icterus, sclera clear and conjunctiva pink.  Ears:  Normal auditory acuity. Nose:  No deformity, discharge,  or lesions. Mouth:  No deformity or lesions, oral mucosa pink.  Lungs:  Clear to auscultation bilaterally. No wheezes, rales, or rhonchi. No distress.  Heart:  S1, S2 present without murmurs appreciated.  Abdomen:  +BS, soft, non-tender and non-distended. No HSM noted. No guarding or rebound. No masses appreciated.  Rectal:  Deferred  Msk:  Symmetrical  without gross deformities. Normal posture. Extremities:  Without edema. Neurologic:  Alert and  oriented x4;  grossly normal neurologically. Psych:  Alert and cooperative. Normal mood and affect.   11/13/15: US abdomen with small stones, no cholecystitis.   Lab Results  Component Value Date   ALT 36 11/17/2015   AST 23 11/17/2015   ALKPHOS 48 11/17/2015   BILITOT 0.8 11/17/2015   Lab Results  Component Value Date   CREATININE 0.64 11/17/2015   BUN 6 11/17/2015   NA 138 11/17/2015   K 3.5 11/17/2015  CL 101 11/17/2015   CO2 29 11/17/2015   Lab Results  Component Value Date   WBC 7.2 11/17/2015   HGB 13.4 11/17/2015   HCT 39.6 11/17/2015   MCV 88.4 11/17/2015   PLT 360 11/17/2015

## 2015-11-16 NOTE — Telephone Encounter (Signed)
Pt. Called requesting to speak to the nurse b/c she was prescribed hydrochlorothiazide (HYDRODIURIL) 25 MG tablet And since she started taking the medication she potasium has been dropping low. Pt. Potasium is 2.6 even though She has been taking her potasium medication. She had to go the the ER twice last Friday. Please f/u with pt.

## 2015-11-17 ENCOUNTER — Encounter (HOSPITAL_COMMUNITY): Payer: Self-pay | Admitting: *Deleted

## 2015-11-17 ENCOUNTER — Emergency Department (HOSPITAL_COMMUNITY)
Admission: EM | Admit: 2015-11-17 | Discharge: 2015-11-17 | Disposition: A | Discharge status: Home / Self Care | Payer: Medicaid Other | Attending: Emergency Medicine | Admitting: Emergency Medicine

## 2015-11-17 DIAGNOSIS — I10 Essential (primary) hypertension: Secondary | ICD-10-CM | POA: Insufficient documentation

## 2015-11-17 DIAGNOSIS — Z87891 Personal history of nicotine dependence: Secondary | ICD-10-CM | POA: Insufficient documentation

## 2015-11-17 DIAGNOSIS — K8020 Calculus of gallbladder without cholecystitis without obstruction: Secondary | ICD-10-CM

## 2015-11-17 DIAGNOSIS — Z79899 Other long term (current) drug therapy: Secondary | ICD-10-CM | POA: Insufficient documentation

## 2015-11-17 DIAGNOSIS — K8050 Calculus of bile duct without cholangitis or cholecystitis without obstruction: Secondary | ICD-10-CM

## 2015-11-17 DIAGNOSIS — M549 Dorsalgia, unspecified: Secondary | ICD-10-CM | POA: Insufficient documentation

## 2015-11-17 DIAGNOSIS — Z7982 Long term (current) use of aspirin: Secondary | ICD-10-CM | POA: Insufficient documentation

## 2015-11-17 LAB — COMPREHENSIVE METABOLIC PANEL
ALBUMIN: 4.3 g/dL (ref 3.5–5.0)
ALK PHOS: 48 U/L (ref 38–126)
ALT: 36 U/L (ref 14–54)
ANION GAP: 8 (ref 5–15)
AST: 23 U/L (ref 15–41)
BUN: 6 mg/dL (ref 6–20)
CHLORIDE: 101 mmol/L (ref 101–111)
CO2: 29 mmol/L (ref 22–32)
Calcium: 9.4 mg/dL (ref 8.9–10.3)
Creatinine, Ser: 0.64 mg/dL (ref 0.44–1.00)
GFR calc non Af Amer: >60 mL/min (ref >60)
GLUCOSE: 98 mg/dL (ref 65–99)
POTASSIUM: 3.5 mmol/L (ref 3.5–5.1)
SODIUM: 138 mmol/L (ref 135–145)
Total Bilirubin: 0.8 mg/dL (ref 0.3–1.2)
Total Protein: 7.5 g/dL (ref 6.5–8.1)

## 2015-11-17 LAB — LIPASE, BLOOD: LIPASE: 26 U/L (ref 11–51)

## 2015-11-17 LAB — CBC
HEMATOCRIT: 39.6 % (ref 36.0–46.0)
HEMOGLOBIN: 13.4 g/dL (ref 12.0–15.0)
MCH: 29.9 pg (ref 26.0–34.0)
MCHC: 33.8 g/dL (ref 30.0–36.0)
MCV: 88.4 fL (ref 78.0–100.0)
Platelets: 360 10*3/uL (ref 150–400)
RBC: 4.48 MIL/uL (ref 3.87–5.11)
RDW: 12.6 % (ref 11.5–15.5)
WBC: 7.2 10*3/uL (ref 4.0–10.5)

## 2015-11-17 LAB — BASIC METABOLIC PANEL
BUN: 5 mg/dL — AB (ref 7–25)
CHLORIDE: 96 mmol/L — AB (ref 98–110)
CO2: 27 mmol/L (ref 20–31)
CREATININE: 0.71 mg/dL (ref 0.50–1.10)
Calcium: 9.5 mg/dL (ref 8.6–10.2)
Glucose, Bld: 84 mg/dL (ref 65–99)
POTASSIUM: 3.7 mmol/L (ref 3.5–5.3)
SODIUM: 136 mmol/L (ref 135–146)

## 2015-11-17 LAB — URINE MICROSCOPIC-ADD ON

## 2015-11-17 LAB — URINALYSIS, ROUTINE W REFLEX MICROSCOPIC
BILIRUBIN URINE: NEGATIVE
Glucose, UA: NEGATIVE mg/dL
HGB URINE DIPSTICK: NEGATIVE
Ketones, ur: NEGATIVE mg/dL
Nitrite: NEGATIVE
PH: 7.5 (ref 5.0–8.0)
Protein, ur: NEGATIVE mg/dL
SPECIFIC GRAVITY, URINE: 1.004 — AB (ref 1.005–1.030)

## 2015-11-17 LAB — PREGNANCY, URINE: PREG TEST UR: NEGATIVE

## 2015-11-17 MED ORDER — HYDROCODONE-ACETAMINOPHEN 5-325 MG PO TABS: 1.0000 | ORAL_TABLET | Freq: Four times a day (QID) | ORAL | Status: DC | PRN

## 2015-11-17 MED ORDER — ONDANSETRON 8 MG PO TBDP: 8.0000 mg | ORAL_TABLET | Freq: Three times a day (TID) | ORAL | Status: DC | PRN

## 2015-11-17 MED ORDER — HYDROMORPHONE HCL 1 MG/ML IJ SOLN: 1.0000 mg | Freq: Once | INTRAMUSCULAR | Status: DC

## 2015-11-17 MED ORDER — HYDROMORPHONE HCL 1 MG/ML IJ SOLN
0.5000 mg | Freq: Once | INTRAMUSCULAR | Status: AC
  Administered 2015-11-17: 0.5 mg via INTRAVENOUS
  Filled 2015-11-17: qty 1

## 2015-11-17 MED ORDER — PANTOPRAZOLE SODIUM 40 MG IV SOLR
40.0000 mg | Freq: Once | INTRAVENOUS | Status: AC
  Administered 2015-11-17: 40 mg via INTRAVENOUS
  Filled 2015-11-17: qty 40

## 2015-11-17 MED ORDER — ONDANSETRON HCL 4 MG/2ML IJ SOLN
4.0000 mg | Freq: Once | INTRAMUSCULAR | Status: AC
  Administered 2015-11-17: 4 mg via INTRAVENOUS
  Filled 2015-11-17: qty 2

## 2015-11-17 MED FILL — ONDANSETRON ODT 8 MG TABLET: 8 | 3 days supply | Qty: 10 | Fill #0

## 2015-11-17 MED FILL — MONTELUKAST SOD 10 MG TAB: 10 | 30 days supply | Qty: 30 | Fill #2

## 2015-11-17 NOTE — Assessment & Plan Note (Signed)
35 year old female with multiple GI complaints, presenting to the ED 20 times in the past 6 months, with chest discomfort, diffuse abdominal pain, nausea. Gallbladder remains in situ with stones noted on [place]. Labs unrevealing. CT unrevealing. Differentials including biliary etiology, gastritis, PUD in the setting of excedrin, IBS. Will start with HIDA with fatty meal challenge, obtain outside colonoscopy reports from [facility] due to history of polyps, and anticipate EGD in near future to rule out gastritis/PUD. Discussed avoidance of excedrin. As of note, yesterday evening at chicken nuggets, mac n cheese. Dietary behaviors likely playing a role. I suspected an element of significant anxiety as well compounding symptoms. Further recommendations to follow. Continue PPI.

## 2015-11-17 NOTE — ED Notes (Signed)
Per EMS, pt picked up at her work place, reports back pain and abd pain.  States he was dx with gallstones Friday, was found to have bowel obs as well.  Pt reports nausea and diarrhea x 1 week but denies vomiting.

## 2015-11-17 NOTE — ED Provider Notes (Signed)
CSN: [id number]     Arrival date & time 11/17/15  1229 History   First MD Initiated Contact with Patient 11/17/15 1344     Chief Complaint  Patient presents with  . Back Pain  . Abdominal Pain     (Consider location/radiation/quality/duration/timing/severity/associated sxs/prior Treatment) Patient is a 35 y.o. female presenting with back pain and abdominal pain. The history is provided by the patient.  Back Pain Associated symptoms: abdominal pain   Associated symptoms: no chest pain, no dysuria, no fever and no headaches   Abdominal Pain Associated symptoms: nausea   Associated symptoms: no chest pain, no chills, no cough, no diarrhea, no dysuria, no fever, no shortness of breath, no sore throat and no vomiting   Patient c/o upper abd pain, that occasionally radiates to mid scapular area in past couple weeks.  Had episode pain today, that persists. Pain moderate, dull, without specific exacerbating or alleviating factors. Patient denies taking any pain med today/pta. Nausea. Denies fever or chills. No dysuria, hematuria or gu c/o. No lower abd or pelvic pain. No vaginal discharge or bleeding.  Denies chest pain. No sob.   States recently dx w gallstones, feels same.        Past Medical History  Diagnosis Date  . Headache(784.0)     sinus  . GERD (gastroesophageal reflux disease)   . Kidney stones     no current problem  . Anxiety   . Deviated septum 09/2011  . 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  . Complication of anesthesia     states small mouth  . Pelvic inflammatory disease   . Urinary tract infection   . Migraine   . Hypertension    Past Surgical History  Procedure Laterality Date  . Colonoscopy      remote past  . Wisdom tooth extraction    . Nasal septoplasty w/ turbinoplasty  10/04/2011    Procedure: NASAL SEPTOPLASTY WITH TURBINATE REDUCTION;  Surgeon: Ascencion Dike, MD;  Location: [facility];  Service: ENT;  Laterality: Bilateral;    Family History  Problem Relation Age of Onset  . Cancer Mother     renal cancer  . Hypertension Mother   . Migraines Mother   . Multiple sclerosis Mother   . Hypertension Father   . Liver disease Father     liver transplant; had fatty liver disease  . Spina bifida Brother   . Hypertension Other   . Migraines Maternal Grandmother   . Colon cancer Paternal Grandfather   . Fibromyalgia Mother   . Liver disease Mother     NASH    Social History  Substance Use Topics  . Smoking status: Former Smoker -- 0.25 packs/day    Quit date: 08/23/2009  . Smokeless tobacco: Never Used  . Alcohol Use: No   OB History    Gravida Para Term Preterm AB TAB SAB Ectopic Multiple Living   4 4 4       4      Review of Systems  Constitutional: Negative for fever and chills.  HENT: Negative for sore throat.   Eyes: Negative for redness.  Respiratory: Negative for cough and shortness of breath.   Cardiovascular: Negative for chest pain.  Gastrointestinal: Positive for nausea and abdominal pain. Negative for vomiting and diarrhea.  Genitourinary: Negative for dysuria and flank pain.  Musculoskeletal: Positive for back pain. Negative for neck pain.  Skin: Negative for rash.  Neurological: Negative for headaches.  Hematological: Does  not bruise/bleed easily.  Psychiatric/Behavioral: Negative for confusion.      Allergies  Macrobid and Sulfa antibiotics  Home Medications   Prior to Admission medications   Medication Sig Start Date End Date Taking? Authorizing Provider  acetaminophen (TYLENOL) 500 MG tablet Take 500-1,000 mg by mouth every 6 (six) hours as needed for mild pain, moderate pain, fever or headache. Reported on 10/20/2015   Yes Historical Provider, MD  Aspirin-Acetaminophen-Caffeine (EXCEDRIN PO) Take by mouth.   Yes Historical Provider, MD  clonazePAM (KLONOPIN) 0.5 MG tablet Take 1 tablet (0.5 mg total) by mouth daily. 10/20/15  Yes Robyn Haber, MD  levonorgestrel-ethinyl  estradiol (AUBRA) 0.1-20 MG-MCG tablet Take 1 tablet by mouth daily. Reported on 11/09/2015   Yes Historical Provider, MD  montelukast (SINGULAIR) 10 MG tablet TAKE 1 TABLET BY MOUTH DAILY 09/07/15  Yes Lance Bosch, NP  pantoprazole (PROTONIX) 40 MG tablet Take 1 tablet (40 mg total) by mouth daily. 10/27/15  Yes Maren Reamer, MD  potassium chloride (K-DUR,KLOR-CON) 20 MEQ tablet Take 1 tablet (20 mEq total) by mouth daily. Start after finish ER kdur course. 11/11/15  Yes Wardell Honour, MD  traMADol (ULTRAM) 50 MG tablet Take 1 tablet (50 mg total) by mouth every 6 (six) hours as needed. Patient taking differently: Take 50 mg by mouth every 6 (six) hours as needed for moderate pain.  11/14/15  Yes Shari Upstill, PA-C  hydrochlorothiazide (HYDRODIURIL) 25 MG tablet Take 1 tablet (25 mg total) by mouth daily. Patient not taking: Reported on 11/17/2015 10/27/15   Maren Reamer, MD  sucralfate (CARAFATE) 1 g tablet Take 1 tablet (1 g total) by mouth 4 (four) times daily. Patient not taking: Reported on 11/17/2015 11/14/15   Lacretia Leigh, MD   BP 120/76 mmHg  Pulse 70  Temp(Src) 97.8 F (36.6 C) (Oral)  Resp 14  SpO2 99% Physical Exam  Constitutional: She appears well-developed and well-nourished. No distress.  HENT:  Mouth/Throat: Oropharynx is clear and moist.  Eyes: Conjunctivae are normal. No scleral icterus.  Neck: Neck supple. No tracheal deviation present.  Cardiovascular: Normal rate, regular rhythm, normal heart sounds and intact distal pulses.   No murmur heard. Pulmonary/Chest: Effort normal and breath sounds normal. No respiratory distress.  Abdominal: Soft. Normal appearance and bowel sounds are normal. She exhibits no distension and no mass. There is no tenderness. There is no rebound and no guarding.  Genitourinary:  No cva tenderness  Musculoskeletal: She exhibits no edema.  Neurological: She is alert.  Skin: Skin is warm and dry. No rash noted. She is not diaphoretic.   Psychiatric: She has a normal mood and affect.  Nursing note and vitals reviewed.   ED Course  Procedures (including critical care time) Labs Review  Results for orders placed or performed during the hospital encounter of 11/17/15  Lipase, blood  Result Value Ref Range   Lipase 26 11 - 51 U/L  Comprehensive metabolic panel  Result Value Ref Range   Sodium 138 135 - 145 mmol/L   Potassium 3.5 3.5 - 5.1 mmol/L   Chloride 101 101 - 111 mmol/L   CO2 29 22 - 32 mmol/L   Glucose, Bld 98 65 - 99 mg/dL   BUN 6 6 - 20 mg/dL   Creatinine, Ser 0.64 0.44 - 1.00 mg/dL   Calcium 9.4 8.9 - 10.3 mg/dL   Total Protein 7.5 6.5 - 8.1 g/dL   Albumin 4.3 3.5 - 5.0 g/dL   AST 23 15 -  41 U/L   ALT 36 14 - 54 U/L   Alkaline Phosphatase 48 38 - 126 U/L   Total Bilirubin 0.8 0.3 - 1.2 mg/dL   GFR calc non Af Amer >60 >60 mL/min   GFR calc Af Amer >60 >60 mL/min   Anion gap 8 5 - 15  CBC  Result Value Ref Range   WBC 7.2 4.0 - 10.5 K/uL   RBC 4.48 3.87 - 5.11 MIL/uL   Hemoglobin 13.4 12.0 - 15.0 g/dL   HCT 39.6 36.0 - 46.0 %   MCV 88.4 78.0 - 100.0 fL   MCH 29.9 26.0 - 34.0 pg   MCHC 33.8 30.0 - 36.0 g/dL   RDW 12.6 11.5 - 15.5 %   Platelets 360 150 - 400 K/uL  Urinalysis, Routine w reflex microscopic  Result Value Ref Range   Color, Urine YELLOW YELLOW   APPearance CLEAR CLEAR   Specific Gravity, Urine 1.004 (L) 1.005 - 1.030   pH 7.5 5.0 - 8.0   Glucose, UA NEGATIVE NEGATIVE mg/dL   Hgb urine dipstick NEGATIVE NEGATIVE   Bilirubin Urine NEGATIVE NEGATIVE   Ketones, ur NEGATIVE NEGATIVE mg/dL   Protein, ur NEGATIVE NEGATIVE mg/dL   Nitrite NEGATIVE NEGATIVE   Leukocytes, UA TRACE (A) NEGATIVE  Urine microscopic-add on  Result Value Ref Range   Squamous Epithelial / LPF 0-5 (A) NONE SEEN   WBC, UA 0-5 0 - 5 WBC/hpf   RBC / HPF 0-5 0 - 5 RBC/hpf   Bacteria, UA RARE (A) NONE SEEN    Ct Abdomen Pelvis W Contrast  11/14/2015  CLINICAL DATA:  Pt seen here last night left at  approximately 0100. Pt had a baked potato and chicken nuggets after her visit, states she woke up and had diarrhea x 3 w/ streaks of blood present. Pt also c/o feeling, shaky, tingling all over body and all together, "not right." (negative occult blood)Discharged earlier with complaint of intermittent, 8/10 in severity, bilateral upper abdominal pain (R>L) with onset earlier this week. Protonix and Excedrin provided insufficient pain relief at home. EXAM: CT ABDOMEN AND PELVIS WITH CONTRAST TECHNIQUE: Multidetector CT imaging of the abdomen and pelvis was performed using the standard protocol following bolus administration of intravenous contrast. CONTRAST:  164mL ISOVUE-300 IOPAMIDOL (ISOVUE-300) INJECTION 61% COMPARISON:  08/24/2015 and 05/14/2015 FINDINGS: Lung bases:  Clear.  Heart normal in size. Liver, spleen, gallbladder, pancreas, adrenal glands:  Normal. Kidneys, ureters, bladder: Small stable stone in the midpole the right kidney, nonobstructing. No other intrarenal stones. No renal masses. No hydronephrosis. Normal ureters. Normal bladder. Uterus and adnexa:  Unremarkable. Lymph nodes:  No adenopathy. Ascites:  None. Gastrointestinal:  Normal.  Normal appendix visualized. Musculoskeletal: Mild disc degenerative change at L4-L5. No other abnormality. IMPRESSION: 1. No acute findings. No findings to account for this patient's symptoms. 2. Small stable nonobstructing stone in the midpole the right kidney. Mild disc degenerative change at L4-L5. No other abnormalities. Electronically Signed   By: Lajean Manes M.D.   On: 11/14/2015 09:03    US Abdomen Limited Ruq  11/13/2015  CLINICAL DATA:  Right upper quadrant pain radiating to the shoulders. EXAM: US ABDOMEN LIMITED - RIGHT UPPER QUADRANT COMPARISON:  CT abdomen and pelvis 08/24/2015 FINDINGS: Gallbladder: Gallbladder is contracted, likely physiologic. Small stones are demonstrated in the gallbladder. No gallbladder wall thickening or edema. Murphy's  sign is negative. Common bile duct: Diameter: 3.8 mm, normal Liver: Mild increased liver parenchymal echotexture suggesting possible fatty infiltration. No focal  liver lesions identified. IMPRESSION: Cholelithiasis.  No additional changes suggesting cholecystitis. Electronically Signed   By: Lucienne Capers M.D.   On: 11/13/2015 23:34      I have personally reviewed and evaluated these images and lab results as part of my medical decision-making.   MDM   Iv ns. Labs.  Arrives via [facility], states does not have to drive.  Dilaudid .5 mg iv, zofran iv, protonix iv.   Recheck, pain resolved.  abd soft nt.   Recent labs reviewed, recent u/s and ct reviewed.  Discussed plan of gen surg follow up with patient re gallstones.  Pt currently appears stable for d/c.        Lajean Saver, MD 11/17/15 1534

## 2015-11-17 NOTE — ED Notes (Signed)
PT info me she starting to feel warm temp 97.8

## 2015-11-17 NOTE — Discharge Instructions (Signed)
It was our pleasure to provide your ER care today - we hope that you feel better.  Take motrin or aleve as need for pain. You may also take hydrocodone as need for pain. No driving for the next 6 hours or when taking hydrocodone. Also, do not take tylenol or acetaminophen containing medication when taking hydrocodone.  You may take zofran as need for nausea.  For gallstones, follow up with general surgeon in the next 1-2 weeks - see referral - call office to arrange appointment.   Return to ER if worse, new symptoms, fevers, severe pain, persistent vomiting, other concern.  You were given pain medication in the ER - no driving for the next 4 hours.     Biliary Colic Biliary colic is a pain in the upper abdomen. The pain:  Is usually felt on the right side of the abdomen, but it may also be felt in the center of the abdomen, just below the breastbone (sternum).  May spread back toward the right shoulder blade.  May be steady or irregular.  May be accompanied by nausea and vomiting. Most of the time, the pain goes away in 1-5 hours. After the most intense pain passes, the abdomen may continue to ache mildly for about 24 hours. Biliary colic is caused by a blockage in the bile duct. The bile duct is a pathway that carries bile--a liquid that helps to digest fats--from the gallbladder to the small intestine. Biliary colic usually occurs after eating, when the digestive system demands bile. The pain develops when muscle cells contract forcefully to try to move the blockage so that bile can get by. HOME CARE INSTRUCTIONS  Take medicines only as directed by your health care provider.  Drink enough fluid to keep your urine clear or pale yellow.  Avoid fatty, greasy, and fried foods. These kinds of foods increase your body's demand for bile.  Avoid any foods that make your pain worse.  Avoid overeating.  Avoid having a large meal after fasting. SEEK MEDICAL CARE IF:  You develop a  fever.  Your pain gets worse.  You vomit.  You develop nausea that prevents you from eating and drinking. SEEK IMMEDIATE MEDICAL CARE IF:  You suddenly develop a fever and shaking chills.  You develop a yellowish discoloration (jaundice) of:  Skin.  Whites of the eyes.  Mucous membranes.  You have continuous or severe pain that is not relieved with medicines.  You have nausea and vomiting that is not relieved with medicines.  You develop dizziness or you faint.   This information is not intended to replace advice given to you by your health care provider. Make sure you discuss any questions you have with your health care provider.   Document Released: 11/14/2005 Document Revised: 10/28/2014 Document Reviewed: 03/25/2014 Elsevier Interactive Patient Education 2016 [employer].     Cholelithiasis Cholelithiasis (also called gallstones) is a form of gallbladder disease in which gallstones form in your gallbladder. The gallbladder is an organ that stores bile made in the liver, which helps digest fats. Gallstones begin as small crystals and slowly grow into stones. Gallstone pain occurs when the gallbladder spasms and a gallstone is blocking the duct. Pain can also occur when a stone passes out of the duct.  RISK FACTORS  Being female.   Having multiple pregnancies. Health care providers sometimes advise removing diseased gallbladders before future pregnancies.   Being obese.  Eating a diet heavy in fried foods and fat.   Being  older than 69 years and increasing age.   Prolonged use of medicines containing female hormones.   Having diabetes mellitus.   Rapidly losing weight.   Having a family history of gallstones (heredity).  SYMPTOMS  Nausea.   Vomiting.  Abdominal pain.   Yellowing of the skin (jaundice).   Sudden pain. It may persist from several minutes to several hours.  Fever.   Tenderness to the touch. In some cases, when  gallstones do not move into the bile duct, people have no pain or symptoms. These are called "silent" gallstones.  TREATMENT Silent gallstones do not need treatment. In severe cases, emergency surgery may be required. Options for treatment include:  Surgery to remove the gallbladder. This is the most common treatment.  Medicines. These do not always work and may take 6-12 months or more to work.  Shock wave treatment (extracorporeal biliary lithotripsy). In this treatment an ultrasound machine sends shock waves to the gallbladder to break gallstones into smaller pieces that can pass into the intestines or be dissolved by medicine. HOME CARE INSTRUCTIONS   Only take over-the-counter or prescription medicines for pain, discomfort, or fever as directed by your health care provider.   Follow a low-fat diet until seen again by your health care provider. Fat causes the gallbladder to contract, which can result in pain.   Follow up with your health care provider as directed. Attacks are almost always recurrent and surgery is usually required for permanent treatment.  SEEK IMMEDIATE MEDICAL CARE IF:   Your pain increases and is not controlled by medicines.   You have a fever or persistent symptoms for more than 2-3 days.   You have a fever and your symptoms suddenly get worse.   You have persistent nausea and vomiting.  MAKE SURE YOU:   Understand these instructions.  Will watch your condition.  Will get help right away if you are not doing well or get worse.   This information is not intended to replace advice given to you by your health care provider. Make sure you discuss any questions you have with your health care provider.   Document Released: 06/09/2005 Document Revised: 02/13/2013 Document Reviewed: 12/05/2012 Elsevier Interactive Patient Education [employer].

## 2015-11-17 NOTE — ED Notes (Signed)
Pt reports while she was at work today, she became dizzy and started to have back pain.  Was seen here for same Saturday which was found to be have low K+.  Pt also reports nausea and has been having a lot of very small BMs.  Pt reports pending GI appt next Tuesday for a full GI work up.  Took Tramadol around 0200 without relief.  Pt reports pain in her abd and low back is worse when urinating.  Pt is also taking protonix for her stomach without relief.

## 2015-11-19 NOTE — Progress Notes (Signed)
CC'ED TO PCP 

## 2015-11-20 ENCOUNTER — Emergency Department
Admission: EM | Admit: 2015-11-20 | Discharge: 2015-11-21 | Disposition: A | Discharge status: Home / Self Care | Payer: Self-pay | Attending: Emergency Medicine | Admitting: Emergency Medicine

## 2015-11-20 ENCOUNTER — Encounter: Payer: Self-pay | Admitting: Emergency Medicine

## 2015-11-20 ENCOUNTER — Ambulatory Visit: Payer: Medicaid Other | Attending: Internal Medicine

## 2015-11-20 DIAGNOSIS — Z79899 Other long term (current) drug therapy: Secondary | ICD-10-CM | POA: Insufficient documentation

## 2015-11-20 DIAGNOSIS — R109 Unspecified abdominal pain: Secondary | ICD-10-CM

## 2015-11-20 DIAGNOSIS — Z7982 Long term (current) use of aspirin: Secondary | ICD-10-CM | POA: Insufficient documentation

## 2015-11-20 DIAGNOSIS — Z87891 Personal history of nicotine dependence: Secondary | ICD-10-CM | POA: Insufficient documentation

## 2015-11-20 DIAGNOSIS — I10 Essential (primary) hypertension: Secondary | ICD-10-CM | POA: Insufficient documentation

## 2015-11-20 DIAGNOSIS — R1010 Upper abdominal pain, unspecified: Secondary | ICD-10-CM | POA: Insufficient documentation

## 2015-11-20 LAB — URINALYSIS COMPLETE WITH MICROSCOPIC (ARMC ONLY)
BILIRUBIN URINE: NEGATIVE
GLUCOSE, UA: NEGATIVE mg/dL
KETONES UR: NEGATIVE mg/dL
NITRITE: NEGATIVE
Protein, ur: NEGATIVE mg/dL
SPECIFIC GRAVITY, URINE: 1.006 (ref 1.005–1.030)
pH: 8 (ref 5.0–8.0)

## 2015-11-20 LAB — COMPREHENSIVE METABOLIC PANEL
ALK PHOS: 48 U/L (ref 38–126)
ALT: 32 U/L (ref 14–54)
ANION GAP: 8 (ref 5–15)
AST: 20 U/L (ref 15–41)
Albumin: 4 g/dL (ref 3.5–5.0)
BILIRUBIN TOTAL: 0.6 mg/dL (ref 0.3–1.2)
BUN: 7 mg/dL (ref 6–20)
CALCIUM: 9.2 mg/dL (ref 8.9–10.3)
CO2: 26 mmol/L (ref 22–32)
Chloride: 104 mmol/L (ref 101–111)
Creatinine, Ser: 0.68 mg/dL (ref 0.44–1.00)
GFR calc non Af Amer: >60 mL/min (ref >60)
Glucose, Bld: 85 mg/dL (ref 65–99)
POTASSIUM: 3.4 mmol/L — AB (ref 3.5–5.1)
Sodium: 138 mmol/L (ref 135–145)
TOTAL PROTEIN: 7.4 g/dL (ref 6.5–8.1)

## 2015-11-20 LAB — CBC WITH DIFFERENTIAL/PLATELET
BASOS ABS: 0.1 10*3/uL (ref 0–0.1)
Basophils Relative: 1 %
Eosinophils Absolute: 0.3 10*3/uL (ref 0–0.7)
Eosinophils Relative: 3 %
HEMATOCRIT: 40.5 % (ref 35.0–47.0)
Hemoglobin: 13.6 g/dL (ref 12.0–16.0)
Lymphs Abs: 1.9 10*3/uL (ref 1.0–3.6)
MCH: 29.7 pg (ref 26.0–34.0)
MCHC: 33.7 g/dL (ref 32.0–36.0)
MCV: 88.2 fL (ref 80.0–100.0)
MONO ABS: 0.6 10*3/uL (ref 0.2–0.9)
Monocytes Relative: 8 %
NEUTROS ABS: 5.2 10*3/uL (ref 1.4–6.5)
Neutrophils Relative %: 64 %
PLATELETS: 322 10*3/uL (ref 150–440)
RBC: 4.59 MIL/uL (ref 3.80–5.20)
RDW: 12.9 % (ref 11.5–14.5)
WBC: 8 10*3/uL (ref 3.6–11.0)

## 2015-11-20 LAB — LIPASE, BLOOD: LIPASE: 35 U/L (ref 11–51)

## 2015-11-20 MED ORDER — METOCLOPRAMIDE HCL 5 MG/ML IJ SOLN
10.0000 mg | Freq: Once | INTRAMUSCULAR | Status: AC
  Administered 2015-11-20: 10 mg via INTRAVENOUS
  Filled 2015-11-20: qty 2

## 2015-11-20 MED ORDER — GI COCKTAIL ~~LOC~~
30.0000 mL | Freq: Once | ORAL | Status: AC
  Administered 2015-11-20: 30 mL via ORAL
  Filled 2015-11-20: qty 30

## 2015-11-20 MED ORDER — MORPHINE SULFATE (PF) 4 MG/ML IV SOLN
4.0000 mg | Freq: Once | INTRAVENOUS | Status: AC
  Administered 2015-11-20: 4 mg via INTRAVENOUS
  Filled 2015-11-20: qty 1

## 2015-11-20 MED ORDER — SODIUM CHLORIDE 0.9 % IV BOLUS (SEPSIS)
1000.0000 mL | Freq: Once | INTRAVENOUS | Status: AC
  Administered 2015-11-20: 1000 mL via INTRAVENOUS

## 2015-11-20 NOTE — ED Notes (Signed)
Reports recently dx with gallstones.  States now having same pain but meds not helping.  pain In epigastric area and upper back and mid abd.  Also c/o nausea.

## 2015-11-21 ENCOUNTER — Emergency Department: Payer: Self-pay

## 2015-11-21 ENCOUNTER — Encounter (HOSPITAL_COMMUNITY): Payer: Self-pay | Admitting: Emergency Medicine

## 2015-11-21 ENCOUNTER — Emergency Department (HOSPITAL_COMMUNITY): Payer: Medicaid Other

## 2015-11-21 ENCOUNTER — Emergency Department (HOSPITAL_COMMUNITY)
Admission: EM | Admit: 2015-11-21 | Discharge: 2015-11-22 | Disposition: A | Discharge status: Home / Self Care | Payer: Medicaid Other | Attending: Emergency Medicine | Admitting: Emergency Medicine

## 2015-11-21 DIAGNOSIS — R109 Unspecified abdominal pain: Secondary | ICD-10-CM

## 2015-11-21 DIAGNOSIS — Z7982 Long term (current) use of aspirin: Secondary | ICD-10-CM | POA: Insufficient documentation

## 2015-11-21 DIAGNOSIS — R1084 Generalized abdominal pain: Secondary | ICD-10-CM | POA: Insufficient documentation

## 2015-11-21 DIAGNOSIS — I10 Essential (primary) hypertension: Secondary | ICD-10-CM | POA: Insufficient documentation

## 2015-11-21 DIAGNOSIS — Z87891 Personal history of nicotine dependence: Secondary | ICD-10-CM | POA: Insufficient documentation

## 2015-11-21 LAB — PREGNANCY, URINE: Preg Test, Ur: NEGATIVE

## 2015-11-21 MED ORDER — FAMOTIDINE 20 MG PO TABS
40.0000 mg | ORAL_TABLET | Freq: Once | ORAL | Status: AC
  Administered 2015-11-21: 40 mg via ORAL
  Filled 2015-11-21: qty 2

## 2015-11-21 MED ORDER — SUCRALFATE 1 G PO TABS
1.0000 g | ORAL_TABLET | Freq: Once | ORAL | Status: AC
  Administered 2015-11-21: 1 g via ORAL
  Filled 2015-11-21: qty 1

## 2015-11-21 NOTE — ED Provider Notes (Signed)
[facility] Emergency Department Provider Note   ____________________________________________  Time seen: Approximately 2345 AM  I have reviewed the triage vital signs and the nursing notes.   HISTORY  Chief Complaint Abdominal Pain    HPI Christina Fox is a 35 y.o. female who comes into the hospital today with abdominal pain. The patient reports that she has a history of gallstones diagnosed last Friday. She reports that since then she's been sicker. She's been taking her Vicodin and Zofran but reports that the pain seems to be getting worse as well as the nausea. She reports that she developed some pain around her belly button and she is unable to eat. She feels as though there is something twisting in her abdomen. The pain started when she walked in but she came in because she was having back pain that wouldn't go away as well as nausea. The patient reports that she normally would have a bowel movement in the morning this normal but feels constipated in the evenings. The patient rates her pain 8 out of 10 in intensity. She's been feeling so bad that she feels as though she was going to pass out. She reports that before coming into the hospital tonight she took Vicodin but reports that it did not help. She reports that a Psychologist, sport and exercise won't see her because of her insurance. The patient reports that she's not taken her blood pressure medicine since Monday as well. The patient is concerned so she decided to come into the hospital.   Past Medical History  Diagnosis Date  . Headache(784.0)     sinus  . GERD (gastroesophageal reflux disease)   . Kidney stones     no current problem  . Anxiety   . Deviated septum 09/2011  . 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  . Complication of anesthesia     states small mouth  . Pelvic inflammatory disease   . Urinary tract infection   . Migraine   . Hypertension     Patient Active Problem List   Diagnosis Date  Noted  . Abdominal pain 11/16/2015  . Headache 10/13/2015  . 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 Anxiety disorder 11/03/2014    Past Surgical History  Procedure Laterality Date  . Colonoscopy      remote past  . Wisdom tooth extraction    . Nasal septoplasty w/ turbinoplasty  10/04/2011    Procedure: NASAL SEPTOPLASTY WITH TURBINATE REDUCTION;  Surgeon: Ascencion Dike, MD;  Location: [place];  Service: ENT;  Laterality: Bilateral;    Current Outpatient Rx  Name  Route  Sig  Dispense  Refill  . acetaminophen (TYLENOL) 500 MG tablet   Oral   Take 500-1,000 mg by mouth every 6 (six) hours as needed for mild pain, moderate pain, fever or headache. Reported on 10/20/2015         . Aspirin-Acetaminophen-Caffeine (EXCEDRIN PO)   Oral   Take by mouth.         . clonazePAM (KLONOPIN) 0.5 MG tablet   Oral   Take 1 tablet (0.5 mg total) by mouth daily.   30 tablet   5   . hydrochlorothiazide (HYDRODIURIL) 25 MG tablet   Oral   Take 1 tablet (25 mg total) by mouth daily. Patient not taking: Reported on 11/17/2015   90 tablet   3   . HYDROcodone-acetaminophen (NORCO/VICODIN) 5-325 MG tablet   Oral   Take 1-2 tablets by mouth every 6 (  six) hours as needed for moderate pain.   15 tablet   0   . levonorgestrel-ethinyl estradiol (AUBRA) 0.1-20 MG-MCG tablet   Oral   Take 1 tablet by mouth daily. Reported on 11/09/2015         . montelukast (SINGULAIR) 10 MG tablet      TAKE 1 TABLET BY MOUTH DAILY   30 tablet   3   . ondansetron (ZOFRAN ODT) 8 MG disintegrating tablet   Oral   Take 1 tablet (8 mg total) by mouth every 8 (eight) hours as needed for nausea or vomiting.   10 tablet   0   . pantoprazole (PROTONIX) 40 MG tablet   Oral   Take 1 tablet (40 mg total) by mouth daily.   30 tablet   3   . potassium chloride (K-DUR,KLOR-CON) 20 MEQ tablet   Oral   Take 1 tablet (20 mEq total) by mouth daily. Start after finish ER kdur course.   30 tablet   5   .  sucralfate (CARAFATE) 1 g tablet   Oral   Take 1 tablet (1 g total) by mouth 4 (four) times daily. Patient not taking: Reported on 11/17/2015   30 tablet   0   . traMADol (ULTRAM) 50 MG tablet   Oral   Take 1 tablet (50 mg total) by mouth every 6 (six) hours as needed. Patient taking differently: Take 50 mg by mouth every 6 (six) hours as needed for moderate pain.    15 tablet   0     Allergies Macrobid and Sulfa antibiotics  Family History  Problem Relation Age of Onset  . Cancer Mother     renal cancer  . Hypertension Mother   . Migraines Mother   . Multiple sclerosis Mother   . Hypertension Father   . Liver disease Father     liver transplant; had fatty liver disease  . Spina bifida Brother   . Hypertension Other   . Migraines Maternal Grandmother   . Colon cancer Paternal Grandfather   . Fibromyalgia Mother   . Liver disease Mother     NASH     Social History Social History  Substance Use Topics  . Smoking status: Former Smoker -- 0.25 packs/day    Quit date: 08/23/2009  . Smokeless tobacco: Never Used  . Alcohol Use: No    Review of Systems Constitutional: No fever/chills Eyes: No visual changes. ENT: No sore throat. Cardiovascular: Denies chest pain. Respiratory: Denies shortness of breath. Gastrointestinal:  abdominal pain.   nausea, no vomiting.  No diarrhea.  No constipation. Genitourinary: Negative for dysuria. Musculoskeletal: back pain. Skin: Negative for rash. Neurological: Negative for headaches, focal weakness or numbness.  10-point ROS otherwise negative.  ____________________________________________   PHYSICAL EXAM:  VITAL SIGNS: ED Triage Vitals  Enc Vitals Group     BP 11/20/15 1851 139/68 mmHg     Pulse Rate 11/20/15 1851 58     Resp 11/20/15 1851 18     Temp 11/20/15 1851 97.7 F (36.5 C)     Temp Source 11/20/15 1851 Oral     SpO2 11/20/15 1851 100 %     Weight 11/20/15 1851 186 lb (84.369 kg)     Height 11/20/15 1851  5\' 2"  (1.575 m)     Head Cir --      Peak Flow --      Pain Score 11/20/15 1731 8     Pain Loc --  Pain Edu? --      Excl. in [place]? --     Constitutional: Alert and oriented. Well appearing and in Moderate distress. Eyes: Conjunctivae are normal. PERRL. EOMI. Head: Atraumatic. Nose: No congestion/rhinnorhea. Mouth/Throat: Mucous membranes are moist.  Oropharynx non-erythematous. Cardiovascular: Normal rate, regular rhythm. Grossly normal heart sounds.  Good peripheral circulation. Respiratory: Normal respiratory effort.  No retractions. Lungs CTAB. Gastrointestinal: Soft with upper abdominal tenderness to palpation. No distention.  Musculoskeletal: No lower extremity tenderness nor edema.   Neurologic:  Normal speech and language.  Skin:  Skin is warm, dry and intact.  Psychiatric: Mood and affect are normal. .  ____________________________________________   LABS (all labs ordered are listed, but only abnormal results are displayed)  Labs Reviewed  COMPREHENSIVE METABOLIC PANEL - Abnormal; Notable for the following:    Potassium 3.4 (*)    All other components within normal limits  URINALYSIS COMPLETEWITH MICROSCOPIC ([facility] ONLY) - Abnormal; Notable for the following:    Color, Urine STRAW (*)    APPearance CLEAR (*)    Hgb urine dipstick 1+ (*)    Leukocytes, UA TRACE (*)    Bacteria, UA RARE (*)    Squamous Epithelial / LPF 0-5 (*)    All other components within normal limits  CBC WITH DIFFERENTIAL/PLATELET  LIPASE, BLOOD  PREGNANCY, URINE   ____________________________________________  EKG  ED ECG REPORT I, Loney Hering, the attending physician, personally viewed and interpreted this ECG.   Date: 11/20/2015  EKG Time: 1848  Rate: 70  Rhythm: normal sinus rhythm  Axis: normal  Intervals:none  ST&T Change: Flipped T waves in lead 3  ____________________________________________  RADIOLOGY  Ultrasound right upper quadrant abdomen: Echogenic  structure in the gallbladder may be an adherent gallstone versus gallbladder polyp measures 4 mm, no gallbladder inflammation or acute cholecystitis, no biliary dilatation, hepatic steatosis. ____________________________________________   PROCEDURES  Procedure(s) performed: None  Critical Care performed: No  ____________________________________________   INITIAL IMPRESSION / ASSESSMENT AND PLAN / ED COURSE  Pertinent labs & imaging results that were available during my care of the patient were reviewed by me and considered in my medical decision making (see chart for details).  This is a 35 year old female who comes into the hospital today with some abdominal pain. The patient has been having this pain on and off for a few weeks. The patient has been seen and had an ultrasound with a concern for gallstones. I will give the patient dose of morphine as well as a dose of Zofran. I initially ordered a CT scan but found out that the patient had a CT scan done approximately 7 days ago. I will repeat the patient's ultrasound as well as give her a GI cocktail. The patient will be reassessed once I received the results of the imaging.  The patient also received a dose of Carafate for her pain. At this time the patient's pain is under control. I did inform the patient of the results of her blood work and imaging and recommended follow-up with GI. The patient reports that she has seen GI and has a HIDA scan scheduled for Tuesday. She reports that she does have a prescription for Carafate at home but she has not been able to take it as scheduled so she just has not taken it. I encouraged her to try to take some of it because it will help with her pain and to follow back up. The patient will be discharged home to follow-up with  her GI physician. ____________________________________________   FINAL CLINICAL IMPRESSION(S) / ED DIAGNOSES  Final diagnoses:  Abdominal pain      NEW MEDICATIONS STARTED  DURING THIS VISIT:  New Prescriptions   No medications on file     Note:  This document was prepared using Dragon voice recognition software and may include unintentional dictation errors.    Loney Hering, MD 11/21/15 [phone]

## 2015-11-21 NOTE — ED Provider Notes (Signed)
CSN: [id number]     Arrival date & time 11/21/15  2155 History  By signing my name below, I, Soijett Blue, attest that this documentation has been prepared under the direction and in the presence of Antonietta Breach, PA-C Electronically Signed: Soijett Blue, ED Scribe. 11/21/2015. 11:54 PM.   Chief Complaint  Patient presents with  . Abdominal Pain    The history is provided by the patient. No language interpreter was used.    HPI Comments: Christina Fox is a 35 y.o. female who presents to the Emergency Department complaining of LUQ abdominal pain onset last night. Pt notes that after she eats she has abdominal pain. Pt states that she has tried a bland diet. Pt reports that she is aware that she has a gallstone. Pt states that she has been referred to a GI specialist at [place] and hasn't been seen due to them not accepting her [employer] financial paperwork that states that her bills are 100% covered. Pt reports that she went to [facility] yesterday due to her abdominal pain and it being on her left side instead of her typical right side. She states that she is having associated symptoms of nausea. She states that she has tried zofran, carafate, with no relief for her symptoms. She denies fever, vomiting, and any other symptoms.  Per pt chart review: Pt was seen at [facility] on today, 11/21/15 for abdominal pain, and had [place] completed with IMPRESSION: 1. Echogenic structure in the gallbladder may be an adherent gallstone versus gallbladder polyp, measures 4 mm. No gallbladder inflammation or acute cholecystitis. 2. No biliary dilatation. 3. Hepatic steatosis. Pt was not Rx any medications for the relief of her symptoms.   Past Medical History  Diagnosis Date  . Headache(784.0)     sinus  . GERD (gastroesophageal reflux disease)   . Kidney stones     no current problem  . Anxiety   . Deviated septum 09/2011  . 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  . Complication of anesthesia      states small mouth  . Pelvic inflammatory disease   . Urinary tract infection   . Migraine   . Hypertension    Past Surgical History  Procedure Laterality Date  . Colonoscopy      remote past  . Wisdom tooth extraction    . Nasal septoplasty w/ turbinoplasty  10/04/2011    Procedure: NASAL SEPTOPLASTY WITH TURBINATE REDUCTION;  Surgeon: Ascencion Dike, MD;  Location: [place];  Service: ENT;  Laterality: Bilateral;   Family History  Problem Relation Age of Onset  . Cancer Mother     renal cancer  . Hypertension Mother   . Migraines Mother   . Multiple sclerosis Mother   . Hypertension Father   . Liver disease Father     liver transplant; had fatty liver disease  . Spina bifida Brother   . Hypertension Other   . Migraines Maternal Grandmother   . Colon cancer Paternal Grandfather   . Fibromyalgia Mother   . Liver disease Mother     NASH    Social History  Substance Use Topics  . Smoking status: Former Smoker -- 0.25 packs/day    Quit date: 08/23/2009  . Smokeless tobacco: Never Used  . Alcohol Use: No   OB History    Gravida Para Term Preterm AB TAB SAB Ectopic Multiple Living   4 4 4        4  Review of Systems  Constitutional: Negative for fever.  Gastrointestinal: Positive for nausea and abdominal pain. Negative for vomiting.  All other systems reviewed and are negative.   Allergies  Macrobid and Sulfa antibiotics  Home Medications   Prior to Admission medications   Medication Sig Start Date End Date Taking? Authorizing Provider  acetaminophen (TYLENOL) 500 MG tablet Take 500-1,000 mg by mouth every 6 (six) hours as needed for mild pain, moderate pain, fever or headache. Reported on 10/20/2015   Yes Historical Provider, MD  Aspirin-Acetaminophen-Caffeine (EXCEDRIN PO) Take by mouth.   Yes Historical Provider, MD  clonazePAM (KLONOPIN) 0.5 MG tablet Take 1 tablet (0.5 mg total) by mouth daily. 10/20/15  Yes Robyn Haber, MD   HYDROcodone-acetaminophen (NORCO/VICODIN) 5-325 MG tablet Take 1-2 tablets by mouth every 6 (six) hours as needed for moderate pain. 11/17/15  Yes Lajean Saver, MD  levonorgestrel-ethinyl estradiol (AUBRA) 0.1-20 MG-MCG tablet Take 1 tablet by mouth daily. Reported on 11/09/2015   Yes Historical Provider, MD  montelukast (SINGULAIR) 10 MG tablet TAKE 1 TABLET BY MOUTH DAILY 09/07/15  Yes Lance Bosch, NP  ondansetron (ZOFRAN ODT) 8 MG disintegrating tablet Take 1 tablet (8 mg total) by mouth every 8 (eight) hours as needed for nausea or vomiting. 11/17/15  Yes Lajean Saver, MD  pantoprazole (PROTONIX) 40 MG tablet Take 1 tablet (40 mg total) by mouth daily. 10/27/15  Yes Maren Reamer, MD  sucralfate (CARAFATE) 1 g tablet Take 1 tablet (1 g total) by mouth 4 (four) times daily. 11/14/15  Yes Lacretia Leigh, MD  dicyclomine (BENTYL) 20 MG tablet Take 1 tablet (20 mg total) by mouth 2 (two) times daily. 11/22/15   Antonietta Breach, PA-C  hydrochlorothiazide (HYDRODIURIL) 25 MG tablet Take 1 tablet (25 mg total) by mouth daily. Patient not taking: Reported on 11/17/2015 10/27/15   Maren Reamer, MD  potassium chloride (K-DUR,KLOR-CON) 20 MEQ tablet Take 1 tablet (20 mEq total) by mouth daily. Start after finish ER kdur course. 11/11/15   Wardell Honour, MD  traMADol (ULTRAM) 50 MG tablet Take 1 tablet (50 mg total) by mouth every 6 (six) hours as needed. Patient taking differently: Take 50 mg by mouth every 6 (six) hours as needed for moderate pain.  11/14/15   Shari Upstill, PA-C   BP 142/76 mmHg  Pulse 77  Temp(Src) 97.9 F (36.6 C) (Oral)  Resp 16  SpO2 100%   Physical Exam  Constitutional: She is oriented to person, place, and time. She appears well-developed and well-nourished. No distress.  Nontoxic appearing  HENT:  Head: Normocephalic and atraumatic.  Eyes: Conjunctivae and EOM are normal. No scleral icterus.  Neck: Normal range of motion.  Cardiovascular: Normal rate, regular rhythm and  intact distal pulses.   Pulmonary/Chest: Effort normal and breath sounds normal. No respiratory distress. She has no wheezes. She has no rales.  Respirations even and unlabored. Lungs clear.  Abdominal: Soft. She exhibits no distension. There is tenderness (generalized). There is no rebound and no guarding.  Negative Murphy's sign. Generalized tenderness noted. Abdomen soft and obese. No peritoneal signs or masses.  Musculoskeletal: Normal range of motion.  Neurological: She is alert and oriented to person, place, and time. She exhibits normal muscle tone. Coordination normal.  Skin: Skin is warm and dry. No rash noted. She is not diaphoretic. No erythema. No pallor.  Psychiatric: She has a normal mood and affect. Her behavior is normal.  Nursing note and vitals reviewed.   ED Course  Procedures (including critical care time) DIAGNOSTIC STUDIES: Oxygen Saturation is 99% on RA, nl by my interpretation.    COORDINATION OF CARE: 11:53 PM Discussed treatment plan with pt at bedside which includes labs and pt agreed to plan.  Labs Review Labs Reviewed  COMPREHENSIVE METABOLIC PANEL - Abnormal; Notable for the following:    Potassium 3.1 (*)    BUN 5 (*)    Calcium 8.8 (*)    All other components within normal limits  CBC  LIPASE, BLOOD  I-STAT BETA HCG BLOOD, ED ([facility], [facility], [facility] ONLY)    Imaging Review US Abdomen Limited Ruq  11/21/2015  CLINICAL DATA:  Epigastric and periumbilical pain. Right upper quadrant pain. Symptoms for 1 day. EXAM: US ABDOMEN LIMITED - RIGHT UPPER QUADRANT COMPARISON:  Ultrasound 11/13/2015.  CT 11/14/2015 FINDINGS: Gallbladder: 4 mm echogenic structure in the gallbladder neck is unchanged, may reflect an adherent stone or small polyp. This is better visualized on the current exam due to degree of gallbladder distention. No new gallstones. No thickening visualized. No sonographic Murphy sign noted by sonographer. Common bile duct: Diameter: 4.2 mm, normal. Liver: No  focal lesion identified. Diffusely increased in parenchymal echogenicity. Normal directional flow in the main portal vein. IMPRESSION: 1. Echogenic structure in the gallbladder may be an adherent gallstone versus gallbladder polyp, measures 4 mm. No gallbladder inflammation or acute cholecystitis. 2. No biliary dilatation. 3. Hepatic steatosis. Electronically Signed   By: Jeb Levering M.D.   On: 11/21/2015 01:59   I have personally reviewed and evaluated these images and lab results as part of my medical decision-making.   EKG Interpretation None      MDM   Final diagnoses:  Abdominal pain, unspecified abdominal location    35 year old female presents to the emergency department for complaints of abdominal pain. She was seen earlier this morning at [facility] for similar symptoms. Patient had a reassuring laboratory workup and an ultrasound which showed echogenic structure in the gallbladder possibly reflecting gallstones versus polyps.  Patient complaining of worsening pain, nausea, and vomiting. She is noted to have diffuse tenderness on exam. These findings are stable on reexamination. Her laboratory workup today is unremarkable. She is afebrile and has no leukocytosis. LFTs are reassuring. Patient also was noted to have a negative CT scan on 11/14/2015.  This is the patient's 23rd visit to the emergency department in the past 6 months. I have a high suspicion for psychosomatic cause of her symptoms today. I have encouraged her to follow-up with [facility]. She is currently being seen by a gastroenterologist which may be more beneficial to her in the long run. She has had improvement in her symptoms with Bentyl and Toradol. Will prescribe Bentyl for outpatient use as needed. Return precautions discussed and provided. Patient discharged in satisfactory condition with no unaddressed concerns.  I personally performed the services described in this  documentation, which was scribed in my presence. The recorded information has been reviewed and is accurate.    Filed Vitals:   11/21/15 2241 11/22/15 0048 11/22/15 0154  BP: 148/98 139/73 142/76  Pulse: 67 80 77  Temp: 97.9 F (36.6 C)    TempSrc: Oral    Resp: 18 16 16   SpO2: 99% 99% 100%      Antonietta Breach, PA-C 11/22/15 0603  Veatrice Kells, MD 11/22/15 [phone]

## 2015-11-21 NOTE — ED Notes (Signed)
Patient presents for LUQ abdominal pain, recently diagnosed with gall stones. Reports no relief with prescription medications and bland diet. C/o also nausea.

## 2015-11-21 NOTE — Discharge Instructions (Signed)

## 2015-11-22 LAB — I-STAT BETA HCG BLOOD, ED (MC, WL, AP ONLY)

## 2015-11-22 LAB — CBC
HCT: 40.4 % (ref 36.0–46.0)
Hemoglobin: 13.6 g/dL (ref 12.0–15.0)
MCH: 29.8 pg (ref 26.0–34.0)
MCHC: 33.7 g/dL (ref 30.0–36.0)
MCV: 88.4 fL (ref 78.0–100.0)
PLATELETS: 332 10*3/uL (ref 150–400)
RBC: 4.57 MIL/uL (ref 3.87–5.11)
RDW: 12.7 % (ref 11.5–15.5)
WBC: 7.6 10*3/uL (ref 4.0–10.5)

## 2015-11-22 LAB — COMPREHENSIVE METABOLIC PANEL
ALBUMIN: 4.2 g/dL (ref 3.5–5.0)
ALT: 42 U/L (ref 14–54)
ANION GAP: 8 (ref 5–15)
AST: 26 U/L (ref 15–41)
Alkaline Phosphatase: 43 U/L (ref 38–126)
BUN: 5 mg/dL — ABNORMAL LOW (ref 6–20)
CO2: 26 mmol/L (ref 22–32)
Calcium: 8.8 mg/dL — ABNORMAL LOW (ref 8.9–10.3)
Chloride: 104 mmol/L (ref 101–111)
Creatinine, Ser: 0.72 mg/dL (ref 0.44–1.00)
GFR calc Af Amer: >60 mL/min (ref >60)
GFR calc non Af Amer: >60 mL/min (ref >60)
GLUCOSE: 95 mg/dL (ref 65–99)
POTASSIUM: 3.1 mmol/L — AB (ref 3.5–5.1)
SODIUM: 138 mmol/L (ref 135–145)
TOTAL PROTEIN: 7.4 g/dL (ref 6.5–8.1)
Total Bilirubin: 0.7 mg/dL (ref 0.3–1.2)

## 2015-11-22 LAB — LIPASE, BLOOD: Lipase: 32 U/L (ref 11–51)

## 2015-11-22 MED ORDER — DICYCLOMINE HCL 10 MG/ML IM SOLN
20.0000 mg | Freq: Once | INTRAMUSCULAR | Status: AC
  Administered 2015-11-22: 20 mg via INTRAMUSCULAR
  Filled 2015-11-22: qty 2

## 2015-11-22 MED ORDER — DICYCLOMINE HCL 20 MG PO TABS: 20.0000 mg | ORAL_TABLET | Freq: Two times a day (BID) | ORAL | Status: DC

## 2015-11-22 MED ORDER — HALOPERIDOL LACTATE 5 MG/ML IJ SOLN
2.0000 mg | Freq: Once | INTRAMUSCULAR | Status: AC
  Administered 2015-11-22: 2 mg via INTRAVENOUS
  Filled 2015-11-22: qty 1

## 2015-11-22 MED ORDER — KETOROLAC TROMETHAMINE 30 MG/ML IJ SOLN
30.0000 mg | Freq: Once | INTRAMUSCULAR | Status: AC
  Administered 2015-11-22: 30 mg via INTRAVENOUS
  Filled 2015-11-22: qty 1

## 2015-11-22 NOTE — Discharge Instructions (Signed)

## 2015-11-24 ENCOUNTER — Encounter (HOSPITAL_COMMUNITY)
Admission: RE | Admit: 2015-11-24 | Discharge: 2015-11-24 | Disposition: A | Discharge status: Home / Self Care | Payer: Medicaid Other | Source: Ambulatory Visit | Attending: Gastroenterology | Admitting: Gastroenterology

## 2015-11-24 ENCOUNTER — Other Ambulatory Visit: Payer: Medicaid Other

## 2015-11-24 ENCOUNTER — Ambulatory Visit: Payer: Self-pay | Attending: Internal Medicine | Admitting: Internal Medicine

## 2015-11-24 ENCOUNTER — Encounter: Payer: Self-pay | Admitting: Internal Medicine

## 2015-11-24 ENCOUNTER — Telehealth: Payer: Self-pay | Admitting: Gastroenterology

## 2015-11-24 ENCOUNTER — Telehealth: Payer: Self-pay | Admitting: Internal Medicine

## 2015-11-24 VITALS — BP 141/69 | HR 97 | Temp 98.7°F | Wt 185.4 lb

## 2015-11-24 DIAGNOSIS — F419 Anxiety disorder, unspecified: Secondary | ICD-10-CM | POA: Insufficient documentation

## 2015-11-24 DIAGNOSIS — Z7982 Long term (current) use of aspirin: Secondary | ICD-10-CM | POA: Insufficient documentation

## 2015-11-24 DIAGNOSIS — Z124 Encounter for screening for malignant neoplasm of cervix: Secondary | ICD-10-CM

## 2015-11-24 DIAGNOSIS — K219 Gastro-esophageal reflux disease without esophagitis: Secondary | ICD-10-CM | POA: Insufficient documentation

## 2015-11-24 DIAGNOSIS — I10 Essential (primary) hypertension: Secondary | ICD-10-CM | POA: Insufficient documentation

## 2015-11-24 DIAGNOSIS — R109 Unspecified abdominal pain: Secondary | ICD-10-CM | POA: Insufficient documentation

## 2015-11-24 DIAGNOSIS — F410 Panic disorder [episodic paroxysmal anxiety] without agoraphobia: Secondary | ICD-10-CM | POA: Insufficient documentation

## 2015-11-24 DIAGNOSIS — N86 Erosion and ectropion of cervix uteri: Secondary | ICD-10-CM

## 2015-11-24 DIAGNOSIS — Z87442 Personal history of urinary calculi: Secondary | ICD-10-CM | POA: Insufficient documentation

## 2015-11-24 DIAGNOSIS — Z01411 Encounter for gynecological examination (general) (routine) with abnormal findings: Secondary | ICD-10-CM | POA: Insufficient documentation

## 2015-11-24 DIAGNOSIS — E876 Hypokalemia: Secondary | ICD-10-CM | POA: Insufficient documentation

## 2015-11-24 DIAGNOSIS — Z79899 Other long term (current) drug therapy: Secondary | ICD-10-CM | POA: Insufficient documentation

## 2015-11-24 MED ORDER — TECHNETIUM TC 99M MEBROFENIN IV KIT
5.0000 | PACK | Freq: Once | INTRAVENOUS | Status: AC | PRN
  Administered 2015-11-24: 5 via INTRAVENOUS

## 2015-11-24 MED ORDER — METOPROLOL TARTRATE 25 MG PO TABS: 12.5000 mg | ORAL_TABLET | Freq: Two times a day (BID) | ORAL | Status: DC

## 2015-11-24 NOTE — Telephone Encounter (Signed)
I spoke to pt. She said she did not call for results, she just wants to know if she is gong to have another test and if so when.  Said she still gets sick on her stomach a lot, almost every time that she puts anything on her stomach.  Please advise!

## 2015-11-24 NOTE — Telephone Encounter (Signed)
-----   Message from Maren Reamer, MD sent at 11/10/2015  8:32 AM EDT ----- Please call pt w/ las.  k persistently normal. After she completes her course of K prescribed by ER, please continue Kdur 38meq daily (rx in chart.) will rechk levels when she comes back for pap smear. thanks

## 2015-11-24 NOTE — Patient Instructions (Signed)
DASH Eating Plan  DASH stands for "Dietary Approaches to Stop Hypertension." The DASH eating plan is a healthy eating plan that has been shown to reduce high blood pressure (hypertension). Additional health benefits may include reducing the risk of type 2 diabetes mellitus, heart disease, and stroke. The DASH eating plan may also help with weight loss.  WHAT DO I NEED TO KNOW ABOUT THE DASH EATING PLAN?  For the DASH eating plan, you will follow these general guidelines:  · Choose foods with a percent daily value for sodium of less than 5% (as listed on the food label).  · Use salt-free seasonings or herbs instead of table salt or sea salt.  · Check with your health care provider or pharmacist before using salt substitutes.  · Eat lower-sodium products, often labeled as "lower sodium" or "no salt added."  · Eat fresh foods.  · Eat more vegetables, fruits, and low-fat dairy products.  · Choose whole grains. Look for the word "whole" as the first word in the ingredient list.  · Choose fish and skinless chicken or turkey more often than red meat. Limit fish, poultry, and meat to 6 oz (170 g) each day.  · Limit sweets, desserts, sugars, and sugary drinks.  · Choose heart-healthy fats.  · Limit cheese to 1 oz (28 g) per day.  · Eat more home-cooked food and less restaurant, buffet, and fast food.  · Limit fried foods.  · Cook foods using methods other than frying.  · Limit canned vegetables. If you do use them, rinse them well to decrease the sodium.  · When eating at a restaurant, ask that your food be prepared with less salt, or no salt if possible.  WHAT FOODS CAN I EAT?  Seek help from a dietitian for individual calorie needs.  Grains  Whole grain or whole wheat bread. Brown rice. Whole grain or whole wheat pasta. Quinoa, bulgur, and whole grain cereals. Low-sodium cereals. Corn or whole wheat flour tortillas. Whole grain cornbread. Whole grain crackers. Low-sodium crackers.  Vegetables  Fresh or frozen vegetables  (raw, steamed, roasted, or grilled). Low-sodium or reduced-sodium tomato and vegetable juices. Low-sodium or reduced-sodium tomato sauce and paste. Low-sodium or reduced-sodium canned vegetables.   Fruits  All fresh, canned (in natural juice), or frozen fruits.  Meat and Other Protein Products  Ground beef (85% or leaner), grass-fed beef, or beef trimmed of fat. Skinless chicken or turkey. Ground chicken or turkey. Pork trimmed of fat. All fish and seafood. Eggs. Dried beans, peas, or lentils. Unsalted nuts and seeds. Unsalted canned beans.  Dairy  Low-fat dairy products, such as skim or 1% milk, 2% or reduced-fat cheeses, low-fat ricotta or cottage cheese, or plain low-fat yogurt. Low-sodium or reduced-sodium cheeses.  Fats and Oils  Tub margarines without trans fats. Light or reduced-fat mayonnaise and salad dressings (reduced sodium). Avocado. Safflower, olive, or canola oils. Natural peanut or almond butter.  Other  Unsalted popcorn and pretzels.  The items listed above may not be a complete list of recommended foods or beverages. Contact your dietitian for more options.  WHAT FOODS ARE NOT RECOMMENDED?  Grains  White bread. White pasta. White rice. Refined cornbread. Bagels and croissants. Crackers that contain trans fat.  Vegetables  Creamed or fried vegetables. Vegetables in a cheese sauce. Regular canned vegetables. Regular canned tomato sauce and paste. Regular tomato and vegetable juices.  Fruits  Dried fruits. Canned fruit in light or heavy syrup. Fruit juice.  Meat and Other Protein   Products  Fatty cuts of meat. Ribs, chicken wings, bacon, sausage, bologna, salami, chitterlings, fatback, hot dogs, bratwurst, and packaged luncheon meats. Salted nuts and seeds. Canned beans with salt.  Dairy  Whole or 2% milk, cream, half-and-half, and cream cheese. Whole-fat or sweetened yogurt. Full-fat cheeses or blue cheese. Nondairy creamers and whipped toppings. Processed cheese, cheese spreads, or cheese  curds.  Condiments  Onion and garlic salt, seasoned salt, table salt, and sea salt. Canned and packaged gravies. Worcestershire sauce. Tartar sauce. Barbecue sauce. Teriyaki sauce. Soy sauce, including reduced sodium. Steak sauce. Fish sauce. Oyster sauce. Cocktail sauce. Horseradish. Ketchup and mustard. Meat flavorings and tenderizers. Bouillon cubes. Hot sauce. Tabasco sauce. Marinades. Taco seasonings. Relishes.  Fats and Oils  Butter, stick margarine, lard, shortening, ghee, and bacon fat. Coconut, palm kernel, or palm oils. Regular salad dressings.  Other  Pickles and olives. Salted popcorn and pretzels.  The items listed above may not be a complete list of foods and beverages to avoid. Contact your dietitian for more information.  WHERE CAN I FIND MORE INFORMATION?  National Heart, Lung, and [employer] Institute: www.nhlbi.nih.gov/health/health-topics/topics/dash/     This information is not intended to replace advice given to you by your health care provider. Make sure you discuss any questions you have with your health care provider.     Document Released: 06/02/2011 Document Revised: 07/04/2014 Document Reviewed: 04/17/2013  Elsevier Interactive Patient Education ©2016 Elsevier Inc.

## 2015-11-24 NOTE — Telephone Encounter (Signed)
Medical Assistant left message on patient's home and cell voicemail. Voicemail states to give a call back to Nubia with [facility] at [phone].  

## 2015-11-24 NOTE — Telephone Encounter (Signed)
Pt called this afternoon to say that she had a procedure done this morning and was told to call us this afternoon for her results. I told her we would not have results that soon and it would be 7-10 business days and the nurse would be contacting her when they were available. Patient got very anxious about needing to know her plan of care regarding her gallstones and being sick all the time and losing weight. She also said that she's been seen at the ED several times since seeing [place]. Please advise and call [phone]

## 2015-11-24 NOTE — Progress Notes (Signed)
Christina Fox, is a 35 y.o. female  [id number]  [id number]  DOB - 03-21-1981  Chief Complaint  Patient presents with  . Gynecologic Exam        Subjective:   Christina Fox is a 35 y.o. female here today for a follow up visit.  Pt still w/ abd pain, ruq, especially when she eats.  She notes weight loss recently due to decrease po as well as intentional weight loss.  Denies sob/cp currently.  She has been to the Urgent care 5/17, and 5 ED visits since I last saw her on 11/09/15.  She also has been to see GI, and they have subsequently ordered HIDA scan, which was done today.  Pt states she has not had menses since starting OCP. She denies vaginal; discharge.  She has appt next week w/ cardiology, but has not seen psychiatry.  She says overall her "panica attacks" are better.  She stopped taking HCTZ when noted low K.    Patient has No headache, No chest pain, No Nausea, No new weakness tingling or numbness, No Cough - SOB.  She is here w/ her older daughter.  Problem  Htn (Hypertension), Benign    ALLERGIES: Allergies  Allergen Reactions  . Macrobid [Nitrofurantoin Monohyd Macro] Rash  . Sulfa Antibiotics Hives and Rash    PAST MEDICAL HISTORY: Past Medical History  Diagnosis Date  . Headache(784.0)     sinus  . GERD (gastroesophageal reflux disease)   . Kidney stones     no current problem  . Anxiety   . Deviated septum 09/2011  . 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  . Complication of anesthesia     states small mouth  . Pelvic inflammatory disease   . Urinary tract infection   . Migraine   . Hypertension     MEDICATIONS AT HOME: Prior to Admission medications   Medication Sig Start Date End Date Taking? Authorizing Provider  acetaminophen (TYLENOL) 500 MG tablet Take 500-1,000 mg by mouth every 6 (six) hours as needed for mild pain, moderate pain, fever or headache. Reported on 10/20/2015   Yes Historical Provider, MD    Aspirin-Acetaminophen-Caffeine (EXCEDRIN PO) Take by mouth.   Yes Historical Provider, MD  clonazePAM (KLONOPIN) 0.5 MG tablet Take 1 tablet (0.5 mg total) by mouth daily. 10/20/15  Yes Robyn Haber, MD  dicyclomine (BENTYL) 20 MG tablet Take 1 tablet (20 mg total) by mouth 2 (two) times daily. 11/22/15  Yes Antonietta Breach, PA-C  levonorgestrel-ethinyl estradiol (AUBRA) 0.1-20 MG-MCG tablet Take 1 tablet by mouth daily. Reported on 11/09/2015   Yes Historical Provider, MD  montelukast (SINGULAIR) 10 MG tablet TAKE 1 TABLET BY MOUTH DAILY 09/07/15  Yes Lance Bosch, NP  ondansetron (ZOFRAN ODT) 8 MG disintegrating tablet Take 1 tablet (8 mg total) by mouth every 8 (eight) hours as needed for nausea or vomiting. 11/17/15  Yes Lajean Saver, MD  pantoprazole (PROTONIX) 40 MG tablet Take 1 tablet (40 mg total) by mouth daily. 10/27/15  Yes Maren Reamer, MD  sucralfate (CARAFATE) 1 g tablet Take 1 tablet (1 g total) by mouth 4 (four) times daily. 11/14/15  Yes Lacretia Leigh, MD  HYDROcodone-acetaminophen (NORCO/VICODIN) 5-325 MG tablet Take 1-2 tablets by mouth every 6 (six) hours as needed for moderate pain. Patient not taking: Reported on 11/24/2015 11/17/15   Lajean Saver, MD  metoprolol tartrate (LOPRESSOR) 25 MG tablet Take 0.5 tablets (12.5 mg total) by mouth 2 (two) times daily. 11/24/15  Maren Reamer, MD  traMADol (ULTRAM) 50 MG tablet Take 1 tablet (50 mg total) by mouth every 6 (six) hours as needed. Patient not taking: Reported on 11/24/2015 11/14/15   [name], PA-C     Objective:   Filed Vitals:   11/24/15 1607  BP: 141/69  Pulse: 97  Temp: 98.7 F (37.1 C)  TempSrc: Oral  Weight: 185 lb 6.4 oz (84.097 kg)   Pelvic exam done w/ CMA assistance.  Exam General appearance : Awake, alert, not in any distress. Speech Clear. Not toxic looking, obese. HEENT: Atraumatic and Normocephalic, pupils equally reactive to light. Neck: supple, no JVD.  Chest:Good air entry bilaterally, no  added sounds. Bilateral breast/adnexa exam: unremarkable, no palpable masses, no nipple dischage noted, +stretch marks present. CVS: S1 S2 regular, no murmurs/gallups or rubs. Abdomen: Bowel sounds active, mild ttp ruq w/ deep palpation, negative Murphy sign, no g/r  Pelvic Exam: Cervix abnormal in appearance, w/ erythema/ulcerations in 4- 7 O'Clock quandrant, very friable cervix, external genitalia normal, no adnexal masses or tenderness, no cervical motion tenderness, rectovaginal septum normal, uterus normal size, shape, and consistency and vagina with dark/brown/black mucopurlent discharge.  Extremities: B/L Lower Ext shows no edema, both legs are warm to touch Neurology: Awake alert, and oriented X 3, CN II-XII grossly intact, Non focal Skin:No Rash  Data Review Lab Results  Component Value Date   HGBA1C 5.2 10/27/2015    Depression screen [facility] 2/9 11/24/2015 11/11/2015 11/09/2015 10/27/2015 10/20/2015  Decreased Interest 0 2 1 0 0  Down, Depressed, Hopeless 0 1 1 0 0  PHQ - 2 Score 0 3 2 0 0  Altered sleeping 1 3 0 1 -  Tired, decreased energy 0 3 0 2 -  Change in appetite 0 2 0 0 -  Feeling bad or failure about yourself  0 0 0 0 -  Trouble concentrating 0 1 0 0 -  Moving slowly or fidgety/restless 0 1 0 0 -  Suicidal thoughts 0 0 0 0 -  PHQ-9 Score 1 13 2 3  -  Difficult doing work/chores Not difficult at all Somewhat difficult - Somewhat difficult -   11/24/15 HIDA IMPRESSION: Normal hepatobiliary scan with normal gallbladder ejection fraction.  abd [place] limited 11/21/15 IMPRESSION: 1. Echogenic structure in the gallbladder may be an adherent gallstone versus gallbladder polyp, measures 4 mm. No gallbladder inflammation or acute cholecystitis. 2. No biliary dilatation. 3. Hepatic steatosis.  Ct abd T[facility] 11/14/15 IMPRESSION: 1. No acute findings. No findings to account for this patient's symptoms. 2. Small stable nonobstructing stone in the midpole the right kidney. Mild  disc degenerative change at L4-L5. No other Abnormalities.     Assessment & Plan   1. HTN (hypertension), benign Not on hctz anymore due to hypokalemia, but review of BPs in past, sbp has high as 150s. - will start metoprolol tartrate [id number] - BASIC METABOLIC PANEL WITH GFR  2. Abdominal pain, unspecified abdominal location - hida unremarkable today, but [place] on 11/21/15 showed possible gs vs gb polyp. - pt w/ persistent c/o of ruq pain now after eating, denies CP today to me, ?biliary dyskinesia or other gb cause. - f/u w/ GI  - Ambulatory referral to General Surgery for further evaluation/recds.  3. Pap smear for cervical cancer screening, w/ abnormal cervix on exam today; +noted cervix ulcerations - Cytology - PAP - amb ref obgyn, may need bx.  4. Hypokalemia - now off hctz - BASIC METABOLIC PANEL WITH GFR  5.  Morbid obesity, unspecified obesity type ([place]) Has lost some weight recently due to decrease po (some of intentional), some due to gi c/os  6. Anxiety/panic attacks - scoring less on Phq 9, but needs to f/u up w/ Psychiatry, given high suspicion of psychosomatization w/ numerous ED visits.   Patient have been counseled extensively about nutrition and exercise  Return in about 4 weeks (around 12/22/2015).  The patient was given clear instructions to go to ER or return to medical center if symptoms don't improve, worsen or new problems develop. The patient verbalized understanding. The patient was told to call to get lab results if they haven't heard anything in the next week.    Maren Reamer, MD, [place] [facility] [place], [place]   11/24/2015, 6:56 PM

## 2015-11-24 NOTE — Telephone Encounter (Signed)
Pt. Returned call. She stated that her potassium is good now.

## 2015-11-25 ENCOUNTER — Encounter (HOSPITAL_COMMUNITY): Payer: Self-pay

## 2015-11-25 ENCOUNTER — Telehealth: Payer: Self-pay

## 2015-11-25 ENCOUNTER — Ambulatory Visit: Payer: Self-pay | Admitting: Adult Health

## 2015-11-25 LAB — BASIC METABOLIC PANEL WITH GFR
BUN: 5 mg/dL — ABNORMAL LOW (ref 7–25)
CALCIUM: 9.3 mg/dL (ref 8.6–10.2)
CO2: 26 mmol/L (ref 20–31)
CREATININE: 0.63 mg/dL (ref 0.50–1.10)
Chloride: 103 mmol/L (ref 98–110)
GFR, Est Non African American: >89 mL/min (ref >=60)
Glucose, Bld: 94 mg/dL (ref 65–99)
Potassium: 3.7 mmol/L (ref 3.5–5.3)
SODIUM: 140 mmol/L (ref 135–146)

## 2015-11-25 NOTE — Telephone Encounter (Signed)
Called and spoke with pt and informed her that the first available TCS/EGD appt that Dr. Oneida Alar had was June 27. Pt states she can't wait that long and that she seen her PCP and they referred her to [facility] in [place] for her gallstones and that Pat Patrick could possibly do the TCS/EGD sooner.   Please advise if it is ok to send referral to [facility] for procedures.

## 2015-11-25 NOTE — Telephone Encounter (Signed)
Patient did not show to appt today  

## 2015-11-25 NOTE — Telephone Encounter (Signed)
We can refer her to General Surgery here in [place], but they will likely say that she needs an EGD first. If she desires to see [facility], it appears her PCP has already referred her there. I am not aware that they do endoscopic procedures as well. If they do, then they could decide if they want to do that, and she would become their patient. If she desires to transfer care to them, that is completely her decision.   What we can do is refer her here to Dr. Rhoderick Moody in [place] for evaluation of abdominal pain. I am not convinced it is her gallbladder, and I feel she need endoscopic evaluation before pursuing an elective cholecystectomy. However, we can leave this up to the surgeons.   In the meantime, it would be helpful to avoid ALL fatty foods, fried foods, etc. Eat a bland diet. Make sure she is taking Protonix as I prescribed and AVOID Excedrin and other NSAIDs.

## 2015-11-25 NOTE — Telephone Encounter (Signed)
To clarify: I have no problem referring her for a surgical evaluation where she chooses. However, we are not referring to them for endoscopic procedures, as we can do that here.

## 2015-11-25 NOTE — Telephone Encounter (Signed)
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   She had a large adenomatous polyp in 2007 and is overdue for surveillance of this. Needs an EGD as well due to abdominal pain. US abdomen with gallstones but without cholecystitis, and the HIDA scan was normal with an EF of 97%.   I have sent a note to [facility] clinical pool to arrange a TCS/EGD with Dr. Oneida Alar with Propofol.

## 2015-11-25 NOTE — Addendum Note (Signed)
Addended byLottie Mussel T on: 11/25/2015 10:59 AM   Modules accepted: Orders, SmartSet

## 2015-11-25 NOTE — Telephone Encounter (Signed)
PT is aware that she will be scheduled for the TCS/EGD.

## 2015-11-25 NOTE — Progress Notes (Signed)
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   She had a large adenomatous polyp in 2007 and is overdue for surveillance of this. Needs an EGD as well due to abdominal pain. US abdomen with gallstones but without cholecystitis, and the HIDA scan was normal with an EF of 97%.   Please arrange for TCS/EGD with Dr. Oneida Alar WITH PROPOFOL.

## 2015-11-26 ENCOUNTER — Ambulatory Visit (INDEPENDENT_AMBULATORY_CARE_PROVIDER_SITE_OTHER): Payer: Self-pay | Admitting: General Surgery

## 2015-11-26 ENCOUNTER — Telehealth: Payer: Self-pay | Admitting: Internal Medicine

## 2015-11-26 ENCOUNTER — Encounter: Payer: Self-pay | Admitting: General Surgery

## 2015-11-26 ENCOUNTER — Encounter: Payer: Self-pay | Admitting: Adult Health

## 2015-11-26 VITALS — BP 122/80 | HR 80 | Resp 12 | Ht 62.0 in | Wt 183.0 lb

## 2015-11-26 DIAGNOSIS — R1031 Right lower quadrant pain: Secondary | ICD-10-CM

## 2015-11-26 DIAGNOSIS — Z86010 Personal history of colonic polyps: Secondary | ICD-10-CM

## 2015-11-26 LAB — CERVICOVAGINAL ANCILLARY ONLY: Wet Prep (BD Affirm): POSITIVE — AB

## 2015-11-26 MED ORDER — POLYETHYLENE GLYCOL 3350 17 GM/SCOOP PO POWD: ORAL | Status: DC

## 2015-11-26 NOTE — Telephone Encounter (Signed)
Pt is aware and is going to see [facility] today. States she is going to tell them about her visits with [place] and proceed with them for her TCS/EGD

## 2015-11-26 NOTE — Progress Notes (Signed)
See other addendum note

## 2015-11-26 NOTE — Progress Notes (Signed)
Patient ID: Christina Fox, female   DOB: [birth year], 35 y.o.   MRN: [id number]  Chief Complaint  Patient presents with  . Abdominal Pain    HPI Christina Fox is a 35 y.o. female here today for a evaluation of abdominal pain. She states it started out as central chest pain. She states she has been having abdominal pain about one month in her right lower quadrant. Pain comes after she eats. Lost 20 pounds in the last two months. Patient had a ct scan and ultrasound on 11/21/15, HIDA scan done on 11/24/15.    She was seen in the ER on 11/20/15. She does admit to episodes of constipation and diarrhea that started 2 weeks ago. After a BM her back would hurt.   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  History of polypectomy 10 years ago with no follow-up secondary to insurance. She states it does not seem to be associated with an particular foods. Last colonoscopy and upper was on 09/08/2005.  The patient has had 16 emergency department/outpatient visits since the first of this year. Over the past year multiple imaging studies including 2 CT scans of the abdomen, 2 ultrasounds, one HIDA scan with fatty meal stimulation. I personally reviewed the patient's history.   HPI  Past Medical History  Diagnosis Date  . Headache(784.0)     sinus  . GERD (gastroesophageal reflux disease)   . Kidney stones     no current problem  . Anxiety   . Deviated septum 09/2011  . 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  . Complication of anesthesia     states small mouth  . Pelvic inflammatory disease   . Urinary tract infection   . Migraine   . Hypertension     Past Surgical History  Procedure Laterality Date  . Colonoscopy  2007    [facility]: pedunculated 110mm polyp in distal sigmoid, sessile 46mm polyp at splenic flexure, larger polyp tubulovillous adenoma and smaller polyp tubular adenoma   . Wisdom tooth extraction  2009  .  Nasal septoplasty w/ turbinoplasty  10/04/2011    Procedure: NASAL SEPTOPLASTY WITH TURBINATE REDUCTION;  Surgeon: Ascencion Dike, MD;  Location: [place];  Service: ENT;  Laterality: Bilateral;  . Colonoscopy  09-09-15    Family History  Problem Relation Age of Onset  . Kidney cancer Mother   . Hypertension Mother   . Migraines Mother   . Multiple sclerosis Mother   . Hypertension Father   . Liver disease Father     liver transplant; had fatty liver disease  . Spina bifida Brother   . Hypertension Other   . Migraines Maternal Grandmother   . Colon cancer Paternal Grandfather   . Fibromyalgia Mother   . Liver disease Mother     NASH     Social History Social History  Substance Use Topics  . Smoking status: Former Smoker -- 0.25 packs/day    Quit date: 08/23/2009  . Smokeless tobacco: Never Used  . Alcohol Use: No    Allergies  Allergen Reactions  . Macrobid [Nitrofurantoin Monohyd Macro] Rash  . Sulfa Antibiotics Hives and Rash    Current Outpatient Prescriptions  Medication Sig Dispense Refill  . acetaminophen (TYLENOL) 500 MG tablet Take 500-1,000 mg by mouth every 6 (six) hours as needed for mild pain, moderate pain, fever or headache. Reported on 10/20/2015    .  Aspirin-Acetaminophen-Caffeine (EXCEDRIN PO) Take by mouth.    . clonazePAM (KLONOPIN) 0.5 MG tablet Take 1 tablet (0.5 mg total) by mouth daily. 30 tablet 5  . HYDROcodone-acetaminophen (NORCO/VICODIN) 5-325 MG tablet Take 1-2 tablets by mouth every 6 (six) hours as needed for moderate pain. 15 tablet 0  . levonorgestrel-ethinyl estradiol (AUBRA) 0.1-20 MG-MCG tablet Take 1 tablet by mouth daily. Reported on 11/09/2015    . montelukast (SINGULAIR) 10 MG tablet TAKE 1 TABLET BY MOUTH DAILY 30 tablet 3  . ondansetron (ZOFRAN ODT) 8 MG disintegrating tablet Take 1 tablet (8 mg total) by mouth every 8 (eight) hours as needed for nausea or vomiting. 10 tablet 0  . pantoprazole (PROTONIX) 40 MG tablet  Take 1 tablet (40 mg total) by mouth daily. 30 tablet 3  . sucralfate (CARAFATE) 1 g tablet Take 1 tablet (1 g total) by mouth 4 (four) times daily. 30 tablet 0  . fluconazole (DIFLUCAN) 150 MG tablet Take 1 tablet (150 mg total) by mouth daily. 3 tablet 0  . polyethylene glycol powder (GLYCOLAX/MIRALAX) powder 255 grams one bottle for colonoscopy prep 255 g 0  . [DISCONTINUED] escitalopram (LEXAPRO) 10 MG tablet Take 10 mg by mouth daily.      . [DISCONTINUED] ipratropium (ATROVENT) 0.06 % nasal spray Place 2 sprays into both nostrils 4 (four) times daily. (Patient not taking: Reported on 05/14/2015) 15 mL 1   No current facility-administered medications for this visit.    Review of Systems Review of Systems  Constitutional: Negative.  Negative for unexpected weight change.  HENT: Negative.   Eyes: Negative.   Respiratory: Negative.   Cardiovascular: Negative.   Gastrointestinal: Positive for nausea, abdominal pain and diarrhea.  Musculoskeletal: Negative.   Allergic/Immunologic: Negative.   Neurological: Negative.   Hematological: Negative.   Psychiatric/Behavioral: Negative.     Blood pressure 122/80, pulse 80, resp. rate 12, height 5\' 2"  (1.575 m), weight 183 lb (83.008 kg), last menstrual period 11/24/2015.  Physical Exam Physical Exam  Constitutional: She is oriented to person, place, and time. She appears well-developed and well-nourished.  HENT:  Mouth/Throat: Oropharynx is clear and moist.  Eyes: Conjunctivae are normal. No scleral icterus.  Neck: Neck supple. No tracheal deviation present.  Cardiovascular: Normal rate, regular rhythm and normal heart sounds.   Pulmonary/Chest: Effort normal and breath sounds normal.  Abdominal: Soft. Normal appearance and bowel sounds are normal. There is no hepatomegaly. There is no tenderness. No hernia.  Musculoskeletal: Normal range of motion.  Neurological: She is alert and oriented to person, place, and time.  Skin: Skin is warm  and dry.  Psychiatric: Her behavior is normal.    Data Reviewed 2017 CT, multiple ultrasounds and HIDA scan independently reviewed. Asymptomatic nonobstructing renal calculi on CT. One ultrasound suggested a small stone, the second ultrasound said this was a polyp. HIDA scan completed with 97% ejection fraction without symptomatic pain by patient report after unsure meal.  Assessment    Strongly suspicious for irritable bowel disease based on review of multiple previous EGD/PCP/acute care notes on mentioning anxiety.    Plan  I do not believe the biliary tract as a source of her pain. Her symptoms seems much more in line with irritable bowel syndrome. I explained to her there was little likelihood that a cholecystectomy would be of benefit. The need for upper and lower endoscopy to rule out other pathology is, especially in light of her past is true colonic polyps was discussed. Initial plan was to refer  her back to the GI group she originally saw.    Patient reports the GI facility that she had previously seen earlier this year was not able to complete a endoscopic study until end of the month. We'll go ahead and completed an upper lower endoscopy. Lower most important based on her past history of colonic polyps confirmed by review of pathology report identifying a tubulovillous adenoma.     Colonoscopy and upper  with possible biopsy/polypectomy prn: Information regarding the procedure, including its potential risks and complications (including but not limited to perforation of the bowel, which may require emergency surgery to repair, and bleeding) was verbally given to the patient. Educational information regarding lower intestinal endoscopy was given to the patient. Written instructions for how to complete the bowel prep using Miralax were provided. The importance of drinking ample fluids to avoid dehydration as a result of the prep emphasized.  Patient has been scheduled for an upper and  lower endoscopy on 12-22-15 at [facility].   PCP:  Lottie Mussel This information has been scribed by Gaspar Cola CMA.   Robert Bellow 11/27/2015, 10:51 AM

## 2015-11-26 NOTE — Telephone Encounter (Signed)
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 Thank you

## 2015-11-26 NOTE — Patient Instructions (Addendum)
The patient is aware to call back for any questions or concerns. Colonoscopy A colonoscopy is an exam to look at the entire large intestine (colon). This exam can help find problems such as tumors, polyps, inflammation, and areas of bleeding. The exam takes about 1 hour.  LET [facility] CARE PROVIDER KNOW ABOUT:   Any allergies you have.  All medicines you are taking, including vitamins, herbs, eye drops, creams, and over-the-counter medicines.  Previous problems you or members of your family have had with the use of anesthetics.  Any blood disorders you have.  Previous surgeries you have had.  Medical conditions you have. RISKS AND COMPLICATIONS  Generally, this is a safe procedure. However, as with any procedure, complications can occur. Possible complications include:  Bleeding.  Tearing or rupture of the colon wall.  Reaction to medicines given during the exam.  Infection (rare). BEFORE THE PROCEDURE   Ask your health care provider about changing or stopping your regular medicines.  You may be prescribed an oral bowel prep. This involves drinking a large amount of medicated liquid, starting the day before your procedure. The liquid will cause you to have multiple loose stools until your stool is almost clear or light green. This cleans out your colon in preparation for the procedure.  Do not eat or drink anything else once you have started the bowel prep, unless your health care provider tells you it is safe to do so.  Arrange for someone to drive you home after the procedure. PROCEDURE   You will be given medicine to help you relax (sedative).  You will lie on your side with your knees bent.  A long, flexible tube with a light and camera on the end (colonoscope) will be inserted through the rectum and into the colon. The camera sends video back to a computer screen as it moves through the colon. The colonoscope also releases carbon dioxide gas to inflate the colon. This  helps your health care provider see the area better.  During the exam, your health care provider may take a small tissue sample (biopsy) to be examined under a microscope if any abnormalities are found.  The exam is finished when the entire colon has been viewed. AFTER THE PROCEDURE   Do not drive for 24 hours after the exam.  You may have a small amount of blood in your stool.  You may pass moderate amounts of gas and have mild abdominal cramping or bloating. This is caused by the gas used to inflate your colon during the exam.  Ask when your test results will be ready and how you will get your results. Make sure you get your test results.   This information is not intended to replace advice given to you by your health care provider. Make sure you discuss any questions you have with your health care provider.   Document Released: 06/10/2000 Document Revised: 04/03/2013 Document Reviewed: 02/18/2013 Elsevier Interactive Patient Education [employer].   Patient has been scheduled for an upper and lower endoscopy on 12-22-15 at [facility].

## 2015-11-27 ENCOUNTER — Telehealth: Payer: Self-pay | Admitting: Emergency Medicine

## 2015-11-27 ENCOUNTER — Telehealth: Payer: Self-pay

## 2015-11-27 ENCOUNTER — Telehealth: Payer: Self-pay | Admitting: Internal Medicine

## 2015-11-27 ENCOUNTER — Other Ambulatory Visit: Payer: Self-pay | Admitting: Internal Medicine

## 2015-11-27 DIAGNOSIS — G8929 Other chronic pain: Secondary | ICD-10-CM

## 2015-11-27 DIAGNOSIS — R1031 Right lower quadrant pain: Secondary | ICD-10-CM | POA: Insufficient documentation

## 2015-11-27 DIAGNOSIS — R1011 Right upper quadrant pain: Principal | ICD-10-CM

## 2015-11-27 DIAGNOSIS — Z86010 Personal history of colonic polyps: Secondary | ICD-10-CM | POA: Insufficient documentation

## 2015-11-27 LAB — CYTOLOGY - PAP

## 2015-11-27 MED ORDER — FLUCONAZOLE 150 MG PO TABS: 150.0000 mg | ORAL_TABLET | Freq: Every day | ORAL | Status: DC

## 2015-11-27 NOTE — Telephone Encounter (Signed)
Pt explained positive Candida results Informed she needs to take prescribed Diflucan for 3 days and we will notify her with pending pathology results

## 2015-11-27 NOTE — H&P (Signed)
HPI  Christina Fox is a 35 y.o. female here today for a evaluation of abdominal pain. She states it started out as central chest pain. She states she has been having abdominal pain about one month in her right lower quadrant. Pain comes after she eats. Lost 20 pounds in the last two months. Patient had a ct scan and ultrasound on 11/21/15, HIDA scan done on 11/24/15. She was seen in the ER on 11/20/15. She does admit to episodes of constipation and diarrhea that started 2 weeks ago. After a BM her back would hurt.  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  History of polypectomy 10 years ago with no follow-up secondary to insurance.  She states it does not seem to be associated with an particular foods.  Last colonoscopy and upper was on 09/08/2005.  The patient has had 16 emergency department/outpatient visits since the first of this year. Over the past year multiple imaging studies including 2 CT scans of the abdomen, 2 ultrasounds, one HIDA scan with fatty meal stimulation.  I personally reviewed the patient's history.  HPI  Past Medical History   Diagnosis  Date   .  Headache(784.0)      sinus   .  GERD (gastroesophageal reflux disease)    .  Kidney stones      no current problem   .  Anxiety    .  Deviated septum  09/2011   .  Nasal turbinate hypertrophy  09/2011     bilat.   .  Complication of anesthesia      states small mouth   .  Pelvic inflammatory disease    .  Urinary tract infection    .  Migraine    .  Hypertension     Past Surgical History   Procedure  Laterality  Date   .  Colonoscopy   2007     [facility]: pedunculated 24mm polyp in distal sigmoid, sessile 36mm polyp at splenic flexure, larger polyp tubulovillous adenoma and smaller polyp tubular adenoma   .  Wisdom tooth extraction   2009   .  Nasal septoplasty w/ turbinoplasty   10/04/2011     Procedure: NASAL SEPTOPLASTY WITH TURBINATE REDUCTION; Surgeon: Ascencion Dike, MD; Location: [place]; Service: ENT; Laterality: Bilateral;   .  Colonoscopy   09-09-15    Family History   Problem  Relation  Age of Onset   .  Kidney cancer  Mother    .  Hypertension  Mother    .  Migraines  Mother    .  Multiple sclerosis  Mother    .  Hypertension  Father    .  Liver disease  Father      liver transplant; had fatty liver disease   .  Spina bifida  Brother    .  Hypertension  Other    .  Migraines  Maternal Grandmother    .  Colon cancer  Paternal Grandfather    .  Fibromyalgia  Mother    .  Liver disease  Mother      NASH    Social History  Social History   Substance Use Topics   .  Smoking status:  Former Smoker -- 0.25 packs/day     Quit date:  08/23/2009   .  Smokeless tobacco:  Never Used   .  Alcohol Use:  No    Allergies   Allergen  Reactions   .  Macrobid [Nitrofurantoin Monohyd Macro]  Rash   .  Sulfa Antibiotics  Hives and Rash    Current Outpatient Prescriptions   Medication  Sig  Dispense  Refill   .  acetaminophen (TYLENOL) 500 MG tablet  Take 500-1,000 mg by mouth every 6 (six) hours as needed for mild pain, moderate pain, fever or headache. Reported on 10/20/2015     .  Aspirin-Acetaminophen-Caffeine (EXCEDRIN PO)  Take by mouth.     .  clonazePAM (KLONOPIN) 0.5 MG tablet  Take 1 tablet (0.5 mg total) by mouth daily.  30 tablet  5   .  HYDROcodone-acetaminophen (NORCO/VICODIN) 5-325 MG tablet  Take 1-2 tablets by mouth every 6 (six) hours as needed for moderate pain.  15 tablet  0   .  levonorgestrel-ethinyl estradiol (AUBRA) 0.1-20 MG-MCG tablet  Take 1 tablet by mouth daily. Reported on 11/09/2015     .  montelukast (SINGULAIR) 10 MG tablet  TAKE 1 TABLET BY MOUTH DAILY  30 tablet  3   .  ondansetron (ZOFRAN ODT) 8 MG disintegrating tablet  Take 1 tablet (8 mg total) by mouth every 8 (eight) hours as needed for nausea or vomiting.  10 tablet  0   .  pantoprazole (PROTONIX) 40 MG tablet  Take 1 tablet (40 mg total)  by mouth daily.  30 tablet  3   .  sucralfate (CARAFATE) 1 g tablet  Take 1 tablet (1 g total) by mouth 4 (four) times daily.  30 tablet  0   .  fluconazole (DIFLUCAN) 150 MG tablet  Take 1 tablet (150 mg total) by mouth daily.  3 tablet  0   .  polyethylene glycol powder (GLYCOLAX/MIRALAX) powder  255 grams one bottle for colonoscopy prep  255 g  0   .  [DISCONTINUED] escitalopram (LEXAPRO) 10 MG tablet  Take 10 mg by mouth daily.     .  [DISCONTINUED] ipratropium (ATROVENT) 0.06 % nasal spray  Place 2 sprays into both nostrils 4 (four) times daily. (Patient not taking: Reported on 05/14/2015)  15 mL  1    No current facility-administered medications for this visit.    Review of Systems  Review of Systems  Constitutional: Negative. Negative for unexpected weight change.  HENT: Negative.  Eyes: Negative.  Respiratory: Negative.  Cardiovascular: Negative.  Gastrointestinal: Positive for nausea, abdominal pain and diarrhea.  Musculoskeletal: Negative.  Allergic/Immunologic: Negative.  Neurological: Negative.  Hematological: Negative.  Psychiatric/Behavioral: Negative.   Blood pressure 122/80, pulse 80, resp. rate 12, height 5\' 2"  (1.575 m), weight 183 lb (83.008 kg), last menstrual period 11/24/2015.  Physical Exam  Physical Exam  Constitutional: She is oriented to person, place, and time. She appears well-developed and well-nourished.  HENT:  Mouth/Throat: Oropharynx is clear and moist.  Eyes: Conjunctivae are normal. No scleral icterus.  Neck: Neck supple. No tracheal deviation present.  Cardiovascular: Normal rate, regular rhythm and normal heart sounds.  Pulmonary/Chest: Effort normal and breath sounds normal.  Abdominal: Soft. Normal appearance and bowel sounds are normal. There is no hepatomegaly. There is no tenderness. No hernia.  Musculoskeletal: Normal range of motion.  Neurological: She is alert and oriented to person, place, and time.  Skin: Skin is warm and dry.   Psychiatric: Her behavior is normal.   Data Reviewed  2017 CT, multiple ultrasounds and HIDA scan independently reviewed. Asymptomatic nonobstructing renal calculi on CT. One ultrasound suggested  a small stone, the second ultrasound said this was a polyp. HIDA scan completed with 97% ejection fraction without symptomatic pain by patient report after unsure meal.  Assessment   Strongly suspicious for irritable bowel disease based on review of multiple previous EGD/PCP/acute care notes on mentioning anxiety.   Plan   Patient reports the GI facility that she had previously seen earlier this year was not able to complete a endoscopic study until end of the month. We'll go ahead and completed an upper lower endoscopy. Lower most important based on her past history of colonic polyps confirmed by review of pathology report identifying a tubulovillous adenoma.   Colonoscopy and upper with possible biopsy/polypectomy prn: Information regarding the procedure, including its potential risks and complications (including but not limited to perforation of the bowel, which may require emergency surgery to repair, and bleeding) was verbally given to the patient. Educational information regarding lower intestinal endoscopy was given to the patient. Written instructions for how to complete the bowel prep using Miralax were provided. The importance of drinking ample fluids to avoid dehydration as a result of the prep emphasized.  Patient has been scheduled for an upper and lower endoscopy on 12-22-15 at [facility].  PCP: Lottie Mussel  This information has been scribed by Gaspar Cola CMA.  Robert Bellow  11/27/2015, 10:51 AM

## 2015-11-27 NOTE — Telephone Encounter (Signed)
Patient would like lab results.

## 2015-11-27 NOTE — Telephone Encounter (Signed)
Pt has questions about her lab results and does she need to start on medication

## 2015-11-29 ENCOUNTER — Encounter (HOSPITAL_BASED_OUTPATIENT_CLINIC_OR_DEPARTMENT_OTHER): Payer: Self-pay

## 2015-11-29 ENCOUNTER — Emergency Department (HOSPITAL_BASED_OUTPATIENT_CLINIC_OR_DEPARTMENT_OTHER)
Admission: EM | Admit: 2015-11-29 | Discharge: 2015-11-29 | Disposition: A | Discharge status: Home / Self Care | Payer: Medicaid Other | Attending: Emergency Medicine | Admitting: Emergency Medicine

## 2015-11-29 DIAGNOSIS — Z87891 Personal history of nicotine dependence: Secondary | ICD-10-CM | POA: Insufficient documentation

## 2015-11-29 DIAGNOSIS — Z79899 Other long term (current) drug therapy: Secondary | ICD-10-CM | POA: Insufficient documentation

## 2015-11-29 DIAGNOSIS — R0981 Nasal congestion: Secondary | ICD-10-CM | POA: Insufficient documentation

## 2015-11-29 DIAGNOSIS — I10 Essential (primary) hypertension: Secondary | ICD-10-CM | POA: Insufficient documentation

## 2015-11-29 DIAGNOSIS — Z7982 Long term (current) use of aspirin: Secondary | ICD-10-CM | POA: Insufficient documentation

## 2015-11-29 DIAGNOSIS — R42 Dizziness and giddiness: Secondary | ICD-10-CM | POA: Insufficient documentation

## 2015-11-29 LAB — PREGNANCY, URINE: PREG TEST UR: NEGATIVE

## 2015-11-29 LAB — CBG MONITORING, ED: Glucose-Capillary: 99 mg/dL (ref 65–99)

## 2015-11-29 MED ORDER — PROMETHAZINE HCL 25 MG PO TABS: 25.0000 mg | ORAL_TABLET | Freq: Four times a day (QID) | ORAL | Status: DC | PRN

## 2015-11-29 MED ORDER — PROMETHAZINE HCL 25 MG PO TABS
25.0000 mg | ORAL_TABLET | Freq: Once | ORAL | Status: DC
  Filled 2015-11-29: qty 1

## 2015-11-29 NOTE — Discharge Instructions (Signed)
Your sinus infection is likely viral and causing inflammation of your inner ear, causing dizziness. Take medications as prescribed. Return for worsening symptoms, including fever, confusion, vomiting unable to keep down food or fluids, inability to walk, or any other symptoms concerning to.  Vertigo Vertigo means that you feel like you are moving when you are not. Vertigo can also make you feel like things around you are moving when they are not. This feeling can come and go at any time. Vertigo often goes away on its own. HOME CARE  Avoid making fast movements.  Avoid driving.  Avoid using heavy machinery.  Avoid doing any task or activity that might cause danger to you or other people if you would have a vertigo attack while you are doing it.  Sit down right away if you feel dizzy or have trouble with your balance.  Take over-the-counter and prescription medicines only as told by your doctor.  Follow instructions from your doctor about which positions or movements you should avoid.  Drink enough fluid to keep your pee (urine) clear or pale yellow.  Keep all follow-up visits as told by your doctor. This is important. GET HELP IF:  Medicine does not help your vertigo.  You have a fever.  Your problems get worse or you have new symptoms.  Your family or friends see changes in your behavior.  You feel sick to your stomach (nauseous) or you throw up (vomit).  You have a "pins and needles" feeling or you are numb in part of your body. GET HELP RIGHT AWAY IF:  You have trouble moving or talking.  You are always dizzy.  You pass out (faint).  You get very bad headaches.  You feel weak or have trouble using your hands, arms, or legs.  You have changes in your hearing.  You have changes in your seeing (vision).  You get a stiff neck.  Bright light starts to bother you.   This information is not intended to replace advice given to you by your health care provider. Make  sure you discuss any questions you have with your health care provider.   Document Released: 03/22/2008 Document Revised: 03/04/2015 Document Reviewed: 10/06/2014 Elsevier Interactive Patient Education [employer].

## 2015-11-29 NOTE — ED Provider Notes (Signed)
CSN: [id number]     Arrival date & time 11/29/15  1251 History   First MD Initiated Contact with Patient 11/29/15 1309     Chief Complaint  Patient presents with  . Dizziness     (Consider location/radiation/quality/duration/timing/severity/associated sxs/prior Treatment) HPI 35 year old female who presents with dizziness. She has a history of migraine headaches and hypertension. States that 3-4 days ago she developed sinus pressure, nasal congestion, and thick sinus drainage. Over the past 2 days has had sensation of dizziness, which she describes as it feels as if her vision is wobbly and almost as if the room is spinning. Does not feel lightheaded and denies any syncope or near syncope. Has not had any fevers or chills. Complains of sinus headache. No nausea or vomiting, vision or speech changes, focal numbness or weakness. Does occasionally have some gait instability but denies any falls. Past Medical History  Diagnosis Date  . Headache(784.0)     sinus  . GERD (gastroesophageal reflux disease)   . Kidney stones     no current problem  . Anxiety   . Deviated septum 09/2011  . 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  . Complication of anesthesia     states small mouth  . Pelvic inflammatory disease   . Urinary tract infection   . Migraine   . Hypertension    Past Surgical History  Procedure Laterality Date  . Colonoscopy  2007    [facility]: pedunculated 86mm polyp in distal sigmoid, sessile 69mm polyp at splenic flexure, larger polyp tubulovillous adenoma and smaller polyp tubular adenoma   . Wisdom tooth extraction  2009  . Nasal septoplasty w/ turbinoplasty  10/04/2011    Procedure: NASAL SEPTOPLASTY WITH TURBINATE REDUCTION;  Surgeon: Ascencion Dike, MD;  Location: [place];  Service: ENT;  Laterality: Bilateral;  . Colonoscopy  09-09-15   Family History  Problem Relation Age of Onset  . Kidney cancer Mother   . Hypertension Mother   . Migraines Mother   .  Multiple sclerosis Mother   . Hypertension Father   . Liver disease Father     liver transplant; had fatty liver disease  . Spina bifida Brother   . Hypertension Other   . Migraines Maternal Grandmother   . Colon cancer Paternal Grandfather   . Fibromyalgia Mother   . Liver disease Mother     NASH    Social History  Substance Use Topics  . Smoking status: Former Smoker -- 0.25 packs/day    Quit date: 08/23/2009  . Smokeless tobacco: Never Used  . Alcohol Use: No   OB History    Gravida Para Term Preterm AB TAB SAB Ectopic Multiple Living   4 4 4       4       Obstetric Comments   1st Menstrual Cycle:  10  1st Pregnancy:  16      Review of Systems 10/14 systems reviewed and are negative other than those stated in the HPI    Allergies  Macrobid and Sulfa antibiotics  Home Medications   Prior to Admission medications   Medication Sig Start Date End Date Taking? Authorizing Provider  acetaminophen (TYLENOL) 500 MG tablet Take 500-1,000 mg by mouth every 6 (six) hours as needed for mild pain, moderate pain, fever or headache. Reported on 10/20/2015   Yes Historical Provider, MD  Aspirin-Acetaminophen-Caffeine (EXCEDRIN PO) Take by mouth.   Yes Historical Provider, MD  clonazePAM (KLONOPIN) 0.5 MG tablet Take  1 tablet (0.5 mg total) by mouth daily. 10/20/15  Yes Robyn Haber, MD  Dicyclomine HCl (BENTYL PO) Take by mouth.   Yes Historical Provider, MD  levonorgestrel-ethinyl estradiol (AUBRA) 0.1-20 MG-MCG tablet Take 1 tablet by mouth daily. Reported on 11/09/2015   Yes Historical Provider, MD  montelukast (SINGULAIR) 10 MG tablet TAKE 1 TABLET BY MOUTH DAILY 09/07/15  Yes Lance Bosch, NP  ondansetron (ZOFRAN ODT) 8 MG disintegrating tablet Take 1 tablet (8 mg total) by mouth every 8 (eight) hours as needed for nausea or vomiting. 11/17/15  Yes Lajean Saver, MD  pantoprazole (PROTONIX) 40 MG tablet Take 1 tablet (40 mg total) by mouth daily. 10/27/15  Yes Maren Reamer, MD   fluconazole (DIFLUCAN) 150 MG tablet Take 1 tablet (150 mg total) by mouth daily. 11/27/15   Maren Reamer, MD  HYDROcodone-acetaminophen (NORCO/VICODIN) 5-325 MG tablet Take 1-2 tablets by mouth every 6 (six) hours as needed for moderate pain. 11/17/15   Lajean Saver, MD  polyethylene glycol powder [facility]) powder 255 grams one bottle for colonoscopy prep 11/26/15   Robert Bellow, MD  promethazine (PHENERGAN) 25 MG tablet Take 1 tablet (25 mg total) by mouth every 6 (six) hours as needed for nausea or vomiting (dizzness). 11/29/15   Forde Dandy, MD  sucralfate (CARAFATE) 1 g tablet Take 1 tablet (1 g total) by mouth 4 (four) times daily. 11/14/15   Lacretia Leigh, MD   BP 155/105 mmHg  Pulse 88  Temp(Src) 98.5 F (36.9 C) (Oral)  Resp 16  Ht 5\' 2"  (1.575 m)  Wt 185 lb (83.915 kg)  BMI 33.83 kg/m2  SpO2 97%  LMP 11/24/2015 Physical Exam Physical Exam  Nursing note and vitals reviewed. Constitutional: Well developed, well nourished, non-toxic, and in no acute distress Head: Normocephalic and atraumatic.  Mouth/Throat: Oropharynx is clear and moist.  Neck: Normal range of motion. Neck supple.  Cardiovascular: Normal rate and regular rhythm.   Pulmonary/Chest: Effort normal and breath sounds normal.  Abdominal: Soft. There is no tenderness. There is no rebound and no guarding.  Musculoskeletal: Normal range of motion.  Skin: Skin is warm and dry.  Psychiatric: Cooperative Neurological:  Alert, oriented to person, place, time, and situation. Memory grossly in tact. Fluent speech. No dysarthria or aphasia.  Cranial nerves: Pupils are symmetric, and reactive to light. EOMI without nystagmus. No gaze deviation. Facial muscles symmetric with activation. Sensation to light touch over face in tact bilaterally. Hearing grossly in tact. Palate elevates symmetrically. Head turn and shoulder shrug are intact. Tongue midline.  Reflexes defered.  Muscle bulk and tone normal. No pronator  drift. Moves all extremities symmetrically. Sensation to light touch is in tact throughout in bilateral upper and lower extremities. Coordination reveals no dysmetria with finger to nose. Gait is narrow-based and steady. Non-ataxic.   ED Course  Procedures (including critical care time) Labs Review Labs Reviewed  PREGNANCY, URINE  CBG MONITORING, ED    Imaging Review No results found. I have personally reviewed and evaluated these images and lab results as part of my medical decision-making.   EKG Interpretation None      MDM   Final diagnoses:  Dizziness  Vertigo    36 year old female with history of HTN and migraine headaches who presents with dizziness. Seems vertiginous in nature by description. In the setting of her recent sinusitis symptoms, this is likely vestibular neuritis. No concerning features of her neurological exam that would suggest central etiology of  her symptoms. Discussed supportive care. Given a course of Phenergan. Strict return and follow-up instructions are reviewed. She expressed understanding of all discharge instructions, and felt comfortable to plan of care.   Forde Dandy, MD 11/29/15 [phone]

## 2015-11-29 NOTE — ED Notes (Signed)
Pt reports onset of dizziness last night, intermittent but still present, has associated headache, "bad taste in mouth." - pt attributes these symptoms to a possible sinus infection. Pt has been taking zyrtec d for the past three days, and ginger with no relief. Gait steady, Speech clear, alert and oriented.

## 2015-11-30 ENCOUNTER — Telehealth: Payer: Self-pay | Admitting: Gastroenterology

## 2015-11-30 ENCOUNTER — Telehealth: Payer: Self-pay

## 2015-11-30 ENCOUNTER — Ambulatory Visit: Payer: Medicaid Other | Admitting: Nurse Practitioner

## 2015-11-30 ENCOUNTER — Encounter: Payer: Self-pay | Admitting: General Practice

## 2015-11-30 LAB — CERVICOVAGINAL ANCILLARY ONLY: HERPES (WINDOWPATH): NEGATIVE

## 2015-11-30 NOTE — Telephone Encounter (Signed)
[place], SEEN 10/2015 HERE AND STATED SHE IS STILL HAVING PAIN IN STOMACH. WENT TO ER

## 2015-11-30 NOTE — Telephone Encounter (Addendum)
She is scheduled for a colonoscopy/EGD on June 27th with Dr. Bary Castilla. Interestingly, this was when we had offered her these procedures, but she decided to seek care elsewhere.   I reviewed Dr. Dwyane Luo note from 6/1. Biliary etiology less likely. Agree with his assessment and IBS likely playing a large role in setting of psychosocial factors.   At this point, as of phone note dated 6/1 at 1320, patient wanted to proceed with [facility] for colonoscopy/EGD. She needs to contact them for any further recommendations.   I stand by my original recommendations to avoid any fatty foods, fried foods, take Protonix, avoid NSAIDs.   She has chosen to pursue care elsewhere. I will copy Dr. Oneida Alar on this for a final recommendation regarding further follow-up in our office; however, we have done all we can since her initial visit on 5/22, including imaging, obtaining and reviewing outside records, and attempting to arrange procedures in a timely manner. She chose to go to another practice for this, which is completely up to her. I made it clear in my last phone note that if she chose to pursue further work-up elsewhere, she would need to continue with that practice.

## 2015-11-30 NOTE — Telephone Encounter (Signed)
Christina Fox, please advise what I need to do since this pt was last seen by you. Looks like she is going else where for procedures.

## 2015-11-30 NOTE — Telephone Encounter (Signed)
Discussed with Dr. Oneida Alar.   Discharge from practice.

## 2015-11-30 NOTE — Telephone Encounter (Signed)
Patient called and states that she would like to cancel her upper and lower endoscopy scheduled for 12/22/15. She states that if she has this done with [place] that she would not be able to be followed by her Gastroenterologist. She will return to them for her follow up care. Trish in Endoscopy notified of cancellation.

## 2015-11-30 NOTE — Telephone Encounter (Signed)
I called and informed pt. She said she did not want to see the doctor that she is scheduled for the colonoscopy. She said her PCP referred her here for GI. That she went to him for surgery consult for GB.  She said he said her GB is OK. When I told her she was scheduled for the colonoscopy elsewhere, she said she didn't tell them to do that. Said she does not want to see him for GI, he is not a gastroenterologist.  I told her that Myrtie Cruise she is not gong to make recommendations when she is scheduled with someone else. She said she was going to call and cancel that appt and call back, she wants Vicente Males to be her GI.

## 2015-11-30 NOTE — Telephone Encounter (Signed)
Is someone calling her with this update regarding discharge?

## 2015-11-30 NOTE — Telephone Encounter (Signed)
Doris, I don't know if I routed this to you or not. Please see recommendations.

## 2015-11-30 NOTE — Telephone Encounter (Signed)
discharge letter mailed  

## 2015-11-30 NOTE — Telephone Encounter (Signed)
Do not take Bentyl any longer.   Continue Protonix. We are discharging from practice as per Dr. Oneida Alar. Per our protocol, we can provide emergent/urgent care for 30 days until she is established elsewhere. Rosendo Gros will be calling her to advise of the discharge. She was prescribed Phenergan on 6/4 per the ED. May take this for nausea along with Zofran.

## 2015-11-30 NOTE — Telephone Encounter (Signed)
I gave pt the medication instructions. I did not mention the discharge. Vicente Males said that she was going to let [place] talk about that. Pt asked when would she be set up for procedure and I told her Vicente Males did not mention that to me yet. She asked what she should do if she gets worse, and I told her to call tomorrow morning or to go to the ED if she got worse.

## 2015-11-30 NOTE — Telephone Encounter (Signed)
Pt left me a vm that she had cancelled the procedures with Dr. Fleet Contras. I called her to find out her symptoms. She said she has some chest pain and pain below right breast. Also, the pain radiates to her upper shoulder blade some.  She has been eating chicken noodle soup, grilled chicken nuggets, no fried foods. Drinking a lot of water, ginger ale and gatorade.  Her first stool in the Am is formed but soft, then she has several episodes of diarrhea. Said she has a lot of nausea and Zofran does not help. The Bentyl is causing some dizziness.  Please advise!

## 2015-11-30 NOTE — Progress Notes (Signed)
REVIEWED-NO ADDITIONAL RECOMMENDATIONS. 

## 2015-11-30 NOTE — Telephone Encounter (Signed)
I read Dr. Dwyane Luo office note. He was initially going to refer her back to [place] for these procedures, but she noted that we could not do it until the end of the month (which was true). So, they scheduled her for the TCS/EGD there, which is the same time we could do it.   She went for a surgery consultation, but she also noted she was going to see about having her procedures there (11/26/15 phone note).   What exactly are her symptoms? Christina Fox, is the 27th still available for a procedure (TCS/EGD with Propofol). IF she desires to keep her GI care here, then she will need to have her procedures here.

## 2015-12-01 ENCOUNTER — Emergency Department (HOSPITAL_COMMUNITY)
Admission: EM | Admit: 2015-12-01 | Discharge: 2015-12-01 | Disposition: A | Discharge status: Home / Self Care | Payer: Self-pay | Attending: Emergency Medicine | Admitting: Emergency Medicine

## 2015-12-01 ENCOUNTER — Ambulatory Visit (INDEPENDENT_AMBULATORY_CARE_PROVIDER_SITE_OTHER): Payer: Self-pay | Admitting: Physician Assistant

## 2015-12-01 ENCOUNTER — Emergency Department (HOSPITAL_COMMUNITY): Payer: Self-pay

## 2015-12-01 ENCOUNTER — Encounter (HOSPITAL_COMMUNITY): Payer: Self-pay

## 2015-12-01 VITALS — BP 122/80 | HR 87 | Temp 97.3°F | Resp 17 | Ht 62.0 in | Wt 183.0 lb

## 2015-12-01 DIAGNOSIS — Z7982 Long term (current) use of aspirin: Secondary | ICD-10-CM | POA: Insufficient documentation

## 2015-12-01 DIAGNOSIS — R9431 Abnormal electrocardiogram [ECG] [EKG]: Secondary | ICD-10-CM

## 2015-12-01 DIAGNOSIS — R1013 Epigastric pain: Secondary | ICD-10-CM | POA: Insufficient documentation

## 2015-12-01 DIAGNOSIS — I10 Essential (primary) hypertension: Secondary | ICD-10-CM | POA: Insufficient documentation

## 2015-12-01 DIAGNOSIS — R42 Dizziness and giddiness: Secondary | ICD-10-CM

## 2015-12-01 DIAGNOSIS — Z79899 Other long term (current) drug therapy: Secondary | ICD-10-CM | POA: Insufficient documentation

## 2015-12-01 DIAGNOSIS — R079 Chest pain, unspecified: Secondary | ICD-10-CM

## 2015-12-01 DIAGNOSIS — R0789 Other chest pain: Secondary | ICD-10-CM | POA: Insufficient documentation

## 2015-12-01 DIAGNOSIS — E876 Hypokalemia: Secondary | ICD-10-CM | POA: Insufficient documentation

## 2015-12-01 DIAGNOSIS — Z87891 Personal history of nicotine dependence: Secondary | ICD-10-CM | POA: Insufficient documentation

## 2015-12-01 LAB — BASIC METABOLIC PANEL
ANION GAP: 11 (ref 5–15)
BUN: <5 mg/dL — ABNORMAL LOW (ref 6–20)
CO2: 21 mmol/L — ABNORMAL LOW (ref 22–32)
Calcium: 9.4 mg/dL (ref 8.9–10.3)
Chloride: 109 mmol/L (ref 101–111)
Creatinine, Ser: 0.92 mg/dL (ref 0.44–1.00)
GFR calc Af Amer: >60 mL/min (ref >60)
Glucose, Bld: 95 mg/dL (ref 65–99)
POTASSIUM: 3.2 mmol/L — AB (ref 3.5–5.1)
SODIUM: 141 mmol/L (ref 135–145)

## 2015-12-01 LAB — CBC
HEMATOCRIT: 40.5 % (ref 36.0–46.0)
HEMOGLOBIN: 13.2 g/dL (ref 12.0–15.0)
MCH: 28.4 pg (ref 26.0–34.0)
MCHC: 32.6 g/dL (ref 30.0–36.0)
MCV: 87.3 fL (ref 78.0–100.0)
Platelets: 262 10*3/uL (ref 150–400)
RBC: 4.64 MIL/uL (ref 3.87–5.11)
RDW: 12.5 % (ref 11.5–15.5)
WBC: 7.3 10*3/uL (ref 4.0–10.5)

## 2015-12-01 LAB — TROPONIN I: Troponin I: <0.03 ng/mL (ref <0.031)

## 2015-12-01 MED ORDER — CLARITHROMYCIN 250 MG PO TABS: 250.0000 mg | ORAL_TABLET | Freq: Two times a day (BID) | ORAL | Status: DC

## 2015-12-01 MED ORDER — METRONIDAZOLE 500 MG PO TABS: 500.0000 mg | ORAL_TABLET | Freq: Two times a day (BID) | ORAL | Status: DC

## 2015-12-01 MED ORDER — GI COCKTAIL ~~LOC~~
30.0000 mL | Freq: Once | ORAL | Status: AC
  Administered 2015-12-01: 30 mL via ORAL
  Filled 2015-12-01: qty 30

## 2015-12-01 MED ORDER — MECLIZINE HCL 25 MG PO TABS: 25.0000 mg | ORAL_TABLET | Freq: Three times a day (TID) | ORAL | Status: DC | PRN

## 2015-12-01 MED ORDER — SUCRALFATE 1 G PO TABS: 1.0000 g | ORAL_TABLET | Freq: Four times a day (QID) | ORAL | Status: DC

## 2015-12-01 MED ORDER — MECLIZINE HCL 25 MG PO TABS
25.0000 mg | ORAL_TABLET | Freq: Once | ORAL | Status: AC
  Administered 2015-12-01: 25 mg via ORAL
  Filled 2015-12-01: qty 1

## 2015-12-01 NOTE — Telephone Encounter (Signed)
Clld pt - advsd of lab results. Pt stated she understood. Pt advsd she received discharge letter from [facility]  - Dr. Oneida Alar, due to her being seen by a surgeon. Pt now requesting referral to her old GI  - [facility] - Dr. Earle Gell.

## 2015-12-01 NOTE — ED Notes (Signed)
Patient undressed, already in gown, on monitor, continuous pulse oximetry and blood pressure cuff

## 2015-12-01 NOTE — Progress Notes (Signed)
Cardiology Office Note   Date:  12/02/2015   ID:  Christina Fox, DOB 1980-11-02, MRN [id number]  PCP:  Maren Reamer, MD  Cardiologist:  Constance Haw, MD    Chief Complaint  Patient presents with  . New Patient (Initial Visit)  . Chest Pain     History of Present Illness: Christina Fox is a 35 y.o. female who presents today for cardiology evaluation.   She has a history of HTN and migraines.  She presented to the ER on 6/4 with dizziness and gait instability but no falls.  She was diagnosed with vertigo as she recently had sinus symptoms and was thought due to vestibular neuritis.  She also says that she has been having episodes of chest pain. The pain is located mainly in the center of her chest but also towards the left side. The pain occurs, at times, when she exerts herself but also can occur when she is at rest. She says it lasts for minutes at a time. She went to the urgent care yesterday with chest discomfort and was noted to have ST depressions in the lateral leads on her EKG and was sent to the emergency room. In the emergency room she had a negative troponin 2.   Today, she denies symptoms of palpitations, shortness of breath, orthopnea, PND, lower extremity edema, claudication, dizziness, presyncope, syncope, bleeding, or neurologic sequela. The patient is tolerating medications without difficulties and is otherwise without complaint today.    Past Medical History  Diagnosis Date  . Headache(784.0)     sinus  . GERD (gastroesophageal reflux disease)   . Kidney stones     no current problem  . Anxiety   . Deviated septum 09/2011  . 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  . Complication of anesthesia     states small mouth  . Pelvic inflammatory disease   . Urinary tract infection   . Migraine   . Hypertension    Past Surgical History  Procedure Laterality Date  . Colonoscopy  2007    [facility]: pedunculated 62mm polyp in distal sigmoid,  sessile 89mm polyp at splenic flexure, larger polyp tubulovillous adenoma and smaller polyp tubular adenoma   . Wisdom tooth extraction  2009  . Nasal septoplasty w/ turbinoplasty  10/04/2011    Procedure: NASAL SEPTOPLASTY WITH TURBINATE REDUCTION;  Surgeon: Ascencion Dike, MD;  Location: [place];  Service: ENT;  Laterality: Bilateral;  . Colonoscopy  09-09-15     Current Outpatient Prescriptions  Medication Sig Dispense Refill  . Aspirin-Acetaminophen-Caffeine (EXCEDRIN PO) Take 1-2 tablets by mouth daily as needed (for headaches).     . clarithromycin (BIAXIN) 250 MG tablet Take 1 tablet (250 mg total) by mouth 2 (two) times daily. 14 tablet 0  . clonazePAM (KLONOPIN) 0.5 MG tablet Take 1 tablet (0.5 mg total) by mouth daily. 30 tablet 5  . levonorgestrel-ethinyl estradiol (AUBRA) 0.1-20 MG-MCG tablet Take 1 tablet by mouth daily. Reported on 11/09/2015    . meclizine (ANTIVERT) 25 MG tablet Take 1 tablet (25 mg total) by mouth 3 (three) times daily as needed for dizziness or nausea. 30 tablet 0  . metroNIDAZOLE (FLAGYL) 500 MG tablet Take 1 tablet (500 mg total) by mouth 2 (two) times daily. One po bid x 7 days 14 tablet 0  . montelukast (SINGULAIR) 10 MG tablet TAKE 1 TABLET BY MOUTH DAILY 30 tablet 3  . Multiple Vitamins-Calcium (ONE-A-DAY WOMENS PO) Take  1 tablet by mouth every morning.    . ondansetron (ZOFRAN ODT) 8 MG disintegrating tablet Take 1 tablet (8 mg total) by mouth every 8 (eight) hours as needed for nausea or vomiting. 10 tablet 0  . pantoprazole (PROTONIX) 40 MG tablet Take 1 tablet (40 mg total) by mouth daily. 30 tablet 3  . sucralfate (CARAFATE) 1 g tablet Take 1 tablet (1 g total) by mouth 4 (four) times daily. 90 tablet 0  . [DISCONTINUED] escitalopram (LEXAPRO) 10 MG tablet Take 10 mg by mouth daily.      . [DISCONTINUED] ipratropium (ATROVENT) 0.06 % nasal spray Place 2 sprays into both nostrils 4 (four) times daily. (Patient not taking: Reported on  05/14/2015) 15 mL 1   No current facility-administered medications for this visit.    Allergies:   Bentyl; Haldol; Meclizine; Morphine and related; Toradol; Macrobid; and Sulfa antibiotics   Social History:  The patient  reports that she quit smoking about 6 years ago. She has never used smokeless tobacco. She reports that she does not drink alcohol or use illicit drugs.   Family History:  The patient's family history includes Colon cancer in her paternal grandfather; Fibromyalgia in her mother; Hypertension in her father, mother, and other; Kidney cancer in her mother; Liver disease in her father and mother; Migraines in her maternal grandmother and mother; Multiple sclerosis in her mother; Spina bifida in her brother.    ROS:  Please see the history of present illness.   Otherwise, review of systems is positive for weight change, appetite change, chills, chest pain, abdominal pain, nausea, vomiting, diarrhea, dizziness, easy bruising.   All other systems are reviewed and negative.    PHYSICAL EXAM: VS:  BP 122/90 mmHg  Pulse 86  Ht 5\' 2"  (1.575 m)  Wt 181 lb 12.8 oz (82.464 kg)  BMI 33.24 kg/m2  LMP 11/24/2015 , BMI Body mass index is 33.24 kg/(m^2). GEN: Well nourished, well developed, in no acute distress HEENT: normal Neck: no JVD, carotid bruits, or masses Cardiac: RRR; no murmurs, rubs, or gallops,no edema  Respiratory:  clear to auscultation bilaterally, normal work of breathing GI: soft, nontender, nondistended, + BS MS: no deformity or atrophy Skin: warm and dry Neuro:  Strength and sensation are intact Psych: euthymic mood, full affect  EKG:  EKG is not ordered today.  Recent Labs: 10/27/2015: TSH 1.38 11/22/2015: ALT 42 12/01/2015: BUN <5*; Creatinine, Ser 0.92; Hemoglobin 13.2; Platelets 262; Potassium 3.2*; Sodium 141    Lipid Panel  No results found for: CHOL, TRIG, HDL, CHOLHDL, VLDL, LDLCALC, LDLDIRECT   Wt Readings from Last 3 Encounters:  12/02/15 181 lb  12.8 oz (82.464 kg)  12/01/15 182 lb (82.555 kg)  12/01/15 183 lb (83.008 kg)      Other studies Reviewed: Additional studies/ records that were reviewed today include: Epic notes   ASSESSMENT AND PLAN:  1.  Chest pain: At this time her chest pain does have both typical and atypical features. She does not currently have a family history of coronary disease, but her grandmother did have aortic stenosis and has a pacemaker now. Due to the fact that she has some typical features of her chest pain, we'll order a stress Myoview to further determine if she has any evidence of coronary artery disease. We'll have her follow-up as needed pending the results of the study.    Current medicines are reviewed at length with the patient today.   The patient does not have concerns regarding  her medicines.  The following changes were made today:  none  Labs/ tests ordered today include:  Orders Placed This Encounter  Procedures  . Myocardial Perfusion Imaging     Disposition:   FU with Anacaren Kohan PRN  Signed, Autum Benfer Meredith Leeds, MD  12/02/2015 4:35 PM     [address] [place] [address] [phone] (office) [phone] (fax)

## 2015-12-01 NOTE — ED Notes (Signed)
Patient comes via EMS from her doctors office. Has been having Chest pain and GI problems for the past month. Patient states she has been having diarrhea as well. Patient A&Ox4

## 2015-12-01 NOTE — ED Notes (Signed)
Patient states she was given Asprin at the Doctors office. States when she stands up she gets dizzy, her face also gets numb and her hands tingle.

## 2015-12-01 NOTE — Patient Instructions (Signed)
     IF you received an x-ray today, you will receive an invoice from [facility]. Please contact [facility] at [phone] with questions or concerns regarding your invoice.   IF you received labwork today, you will receive an invoice from [facility]. Please contact [facility] at [phone] with questions or concerns regarding your invoice.   Our billing staff will not be able to assist you with questions regarding bills from these companies.  You will be contacted with the lab results as soon as they are available. The fastest way to get your results is to activate your My Chart account. Instructions are located on the last page of this paperwork. If you have not heard from us regarding the results in 2 weeks, please contact this office.      

## 2015-12-01 NOTE — Telephone Encounter (Signed)
Patient was notified and stated she received her letter in My Chart on yesterday and she spoke with her pcp about it.

## 2015-12-01 NOTE — Progress Notes (Signed)
12/01/2015 2:46 PM   DOB: [birth year] / MRN: [id number]  SUBJECTIVE:  Christina Fox is a 35 y.o. female presenting for chest pain that started today. She describes the pain as pressure.  She associates SOB, diaphoresis and dizziness.  She has a long history of anxiety but reports she has never felt this way before. She just stopped taking her amitriptyline.  She feels she is getting worse. She has a history of GERD.    She is allergic to macrobid and sulfa antibiotics.   She  has a past medical history of Headache(784.0); GERD (gastroesophageal reflux disease); Kidney stones; Anxiety; Deviated septum (09/2011); Nasal turbinate hypertrophy (09/2011); Complication of anesthesia; Pelvic inflammatory disease; Urinary tract infection; Migraine; and Hypertension.    She  reports that she quit smoking about 6 years ago. She has never used smokeless tobacco. She reports that she does not drink alcohol or use illicit drugs. She  reports that she does not currently engage in sexual activity. She reports using the following method of birth control/protection: Pill. The patient  has past surgical history that includes Colonoscopy (2007); Wisdom tooth extraction (2009); Nasal septoplasty w/ turbinoplasty (10/04/2011); and Colonoscopy (09-09-15).  Her family history includes Colon cancer in her paternal grandfather; Fibromyalgia in her mother; Hypertension in her father, mother, and other; Kidney cancer in her mother; Liver disease in her father and mother; Migraines in her maternal grandmother and mother; Multiple sclerosis in her mother; Spina bifida in her brother.  Review of Systems  Constitutional: Negative for fever and chills.  Respiratory: Positive for shortness of breath. Negative for cough.   Cardiovascular: Positive for chest pain. Negative for leg swelling.  Gastrointestinal: Negative for nausea.  Skin: Negative for itching and rash.  Neurological: Positive for dizziness. Negative for headaches.     Problem list and medications reviewed and updated by myself where necessary, and exist elsewhere in the encounter.   OBJECTIVE:  BP 122/80 mmHg  Pulse 87  Temp(Src) 97.3 F (36.3 C) (Oral)  Resp 17  Ht 5\' 2"  (1.575 m)  Wt 183 lb (83.008 kg)  BMI 33.46 kg/m2  SpO2 98%  LMP 11/24/2015  Physical Exam  Constitutional: She is oriented to person, place, and time. She appears well-nourished. No distress.  Eyes: EOM are normal. Pupils are equal, round, and reactive to light.  Cardiovascular: Normal rate.   Pulmonary/Chest: Effort normal and breath sounds normal.  Abdominal: Soft. Bowel sounds are normal. She exhibits no distension.  Neurological: She is alert and oriented to person, place, and time. No cranial nerve deficit. Gait normal.  Skin: Skin is warm and dry. She is not diaphoretic.  Psychiatric: She has a normal mood and affect.  Vitals reviewed.   Results for orders placed or performed during the hospital encounter of 11/29/15 (from the past 72 hour(s))  Pregnancy, urine     Status: None   Collection Time: 11/29/15  1:05 PM  Result Value Ref Range   Preg Test, Ur NEGATIVE NEGATIVE  CBG monitoring, ED     Status: None   Collection Time: 11/29/15  1:19 PM  Result Value Ref Range   Glucose-Capillary 99 65 - 99 mg/dL    No results found.  ASSESSMENT AND PLAN  Christina Fox was seen today for chest pain and dizziness.  Diagnoses and all orders for this visit:  Chest pain, unspecified chest pain type: I can not rule out ACS here.  Will get her to the ED via emergent transport.  We have given  her 325 of ASA and started an IV.   -     EKG 12-Lead  EKG abnormalities Comments: EKG show some subtle ST segment flattening in the lateral leads.     The patient was advised to call or return to clinic if she does not see an improvement in symptoms or to seek the care of the closest emergency department if she worsens with the above plan.   Christina Fox, MHS, PA-C [facility] 12/01/2015 2:46 PM

## 2015-12-01 NOTE — Telephone Encounter (Signed)
I tried to call the patient, no answer,lmom 

## 2015-12-01 NOTE — ED Provider Notes (Signed)
CSN: [id number]     Arrival date & time 12/01/15  1512 History   First MD Initiated Contact with Patient 12/01/15 1621     Chief Complaint  Patient presents with  . Chest Pain  . GI Problem     (Consider location/radiation/quality/duration/timing/severity/associated sxs/prior Treatment) HPI  Christina Fox is a(n) 35 y.o. female who presents to the ED with cc of cp and epigastric and RUQ abdominal pain. She has assosicated intermittent diarrhea. She statest that her sxs have been constant for 1 month, although she has multiple previous visits for the same complaint. She denies . Her pain seems to be postprandial. She has burning retrosternal cp and nausea. She has a hx of gallstones and has had a negative HIDA scan. She is having complications in the OP setting following up for an endoscopy. She has not been treated for H.Pylori. She denies any vomiting. She states that she has lost weight because she has such difficulty eating food. She feels weak, tired, and very frustrated.  Past Medical History  Diagnosis Date  . Headache(784.0)     sinus  . GERD (gastroesophageal reflux disease)   . Kidney stones     no current problem  . Anxiety   . Deviated septum 09/2011  . 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  . Complication of anesthesia     states small mouth  . Pelvic inflammatory disease   . Urinary tract infection   . Migraine   . Hypertension    Past Surgical History  Procedure Laterality Date  . Colonoscopy  2007    [facility]: pedunculated 43mm polyp in distal sigmoid, sessile 68mm polyp at splenic flexure, larger polyp tubulovillous adenoma and smaller polyp tubular adenoma   . Wisdom tooth extraction  2009  . Nasal septoplasty w/ turbinoplasty  10/04/2011    Procedure: NASAL SEPTOPLASTY WITH TURBINATE REDUCTION;  Surgeon: Ascencion Dike, MD;  Location: [facility];  Service: ENT;  Laterality: Bilateral;  . Colonoscopy  09-09-15   Family History  Problem  Relation Age of Onset  . Kidney cancer Mother   . Hypertension Mother   . Migraines Mother   . Multiple sclerosis Mother   . Hypertension Father   . Liver disease Father     liver transplant; had fatty liver disease  . Spina bifida Brother   . Hypertension Other   . Migraines Maternal Grandmother   . Colon cancer Paternal Grandfather   . Fibromyalgia Mother   . Liver disease Mother     NASH    Social History  Substance Use Topics  . Smoking status: Former Smoker -- 0.25 packs/day    Quit date: 08/23/2009  . Smokeless tobacco: Never Used  . Alcohol Use: No   OB History    Gravida Para Term Preterm AB TAB SAB Ectopic Multiple Living   4 4 4       4       Obstetric Comments   1st Menstrual Cycle:  10  1st Pregnancy:  16      Review of Systems  Ten systems reviewed and are negative for acute change, except as noted in the HPI.    Allergies  Bentyl; Haldol; Morphine and related; Toradol; Macrobid; and Sulfa antibiotics  Home Medications   Prior to Admission medications   Medication Sig Start Date End Date Taking? Authorizing Provider  Aspirin-Acetaminophen-Caffeine (EXCEDRIN PO) Take 1-2 tablets by mouth daily as needed (for headaches).    Yes Historical  Provider, MD  clonazePAM (KLONOPIN) 0.5 MG tablet Take 1 tablet (0.5 mg total) by mouth daily. 10/20/15  Yes Robyn Haber, MD  levonorgestrel-ethinyl estradiol (AUBRA) 0.1-20 MG-MCG tablet Take 1 tablet by mouth daily. Reported on 11/09/2015   Yes Historical Provider, MD  montelukast (SINGULAIR) 10 MG tablet TAKE 1 TABLET BY MOUTH DAILY 09/07/15  Yes Lance Bosch, NP  Multiple Vitamins-Calcium (ONE-A-DAY WOMENS PO) Take 1 tablet by mouth every morning.   Yes Historical Provider, MD  ondansetron (ZOFRAN ODT) 8 MG disintegrating tablet Take 1 tablet (8 mg total) by mouth every 8 (eight) hours as needed for nausea or vomiting. 11/17/15  Yes Lajean Saver, MD  pantoprazole (PROTONIX) 40 MG tablet Take 1 tablet (40 mg total)  by mouth daily. 10/27/15  Yes Maren Reamer, MD  promethazine (PHENERGAN) 25 MG tablet Take 1 tablet (25 mg total) by mouth every 6 (six) hours as needed for nausea or vomiting (dizzness). 11/29/15  Yes Forde Dandy, MD  fluconazole (DIFLUCAN) 150 MG tablet Take 1 tablet (150 mg total) by mouth daily. Patient not taking: Reported on 12/01/2015 11/27/15   Maren Reamer, MD  HYDROcodone-acetaminophen (NORCO/VICODIN) 5-325 MG tablet Take 1-2 tablets by mouth every 6 (six) hours as needed for moderate pain. Patient not taking: Reported on 12/01/2015 11/17/15   Lajean Saver, MD  polyethylene glycol powder [facility] [place]) powder 255 grams one bottle for colonoscopy prep Patient not taking: Reported on 12/01/2015 11/26/15   Robert Bellow, MD  sucralfate (CARAFATE) 1 g tablet Take 1 tablet (1 g total) by mouth 4 (four) times daily. Patient not taking: Reported on 12/01/2015 11/14/15   Lacretia Leigh, MD   BP 139/79 mmHg  Pulse 83  Temp(Src) 97.7 F (36.5 C) (Oral)  Resp 17  Ht 5\' 2"  (1.575 m)  Wt 82.555 kg  BMI 33.28 kg/m2  SpO2 100%  LMP 11/24/2015 Physical Exam  Physical Exam  Nursing note and vitals reviewed. Constitutional: She is oriented to person, place, and time. She appears well-developed and well-nourished. No distress.  HENT:  Head: Normocephalic and atraumatic.  Eyes: Conjunctivae normal and EOM are normal. Pupils are equal, round, and reactive to light. No scleral icterus.  Neck: Normal range of motion.  Cardiovascular: Normal rate, regular rhythm and normal heart sounds.  Exam reveals no gallop and no friction rub.   No murmur heard. Pulmonary/Chest: Effort normal and breath sounds normal. No respiratory distress.  Abdominal: Soft. Bowel sounds are normal. She exhibits no distension and no mass.There is diffuse upper abdominal tenderness. Neurological: She is alert and oriented to person, place, and time.  Skin: Skin is warm and dry. She is not diaphoretic.     ED Course   Procedures (including critical care time) Labs Review Labs Reviewed  CBC  BASIC METABOLIC PANEL  TROPONIN I  I-STAT TROPOININ, ED    Imaging Review Dg Chest 2 View  12/01/2015  CLINICAL DATA:  Chest pain with associated shortness of breath, diaphoresis and dizziness. EXAM: CHEST  2 VIEW COMPARISON:  11/04/2015 FINDINGS: The heart size and mediastinal contours are within normal limits. There is no evidence of pulmonary edema, consolidation, pneumothorax, nodule or pleural fluid. The visualized skeletal structures are unremarkable. IMPRESSION: No active cardiopulmonary disease. Electronically Signed   By: Aletta Edouard M.D.   On: 12/01/2015 16:26   I have personally reviewed and evaluated these images and lab results as part of my medical decision-making.   EKG Interpretation None      MDM  Final diagnoses:  None   Filed Vitals:   12/01/15 1815 12/01/15 1830 12/01/15 1845 12/01/15 1900  BP: 122/78 137/78 127/82 133/82  Pulse: 72 65 70 78  Temp:      TempSrc:      Resp: 20 15 15 12   Height:      Weight:      SpO2: 99% 98% 99% 99%   Patient with mild hypokalemia.  I suspect the her sxs are related to PUD.  Arissa A Edmond is PERC negative: She has a low clinical probability (<6% pretest prob) for PE, is less than 6 years old, HR is <100, O2 sats are >95%, has no prior h/o PE or DVT, has had no recent trauma or surgery, denies hemoptysis, is not taking exogenous estrogen and has no unilateral leg swelling giving them a <2% risk of PE.  Her HEART score is 1. I will treat the patient for h.pylori with clarithromycin, flagyl, protonix,  carafate for sxs treatment and meclizine for nausea. SHe appears safe for discharge.. She has a a follow up appointment with her pcp. Appears safe for discharge at this tme.   Margarita Mail, PA-C 12/07/15 0957  Jola Schmidt, MD 12/08/15 [phone]

## 2015-12-01 NOTE — Discharge Instructions (Signed)
Please discontinue the phenergan and use the meclizine.  Food Choices for Peptic Ulcer Disease When you have peptic ulcer disease, the foods you eat and your eating habits are very important. Choosing the right foods can help ease the discomfort of peptic ulcer disease. WHAT GENERAL GUIDELINES DO I NEED TO FOLLOW?  Choose fruits, vegetables, whole grains, and low-fat meat, fish, and poultry.   Keep a food diary to identify foods that cause symptoms.  Avoid foods that cause irritation or pain. These may be different for different people.  Eat frequent small meals instead of three large meals each day. The pain may be worse when your stomach is empty.  Avoid eating close to bedtime. WHAT FOODS ARE NOT RECOMMENDED? The following are some foods and drinks that may worsen your symptoms:  Black, white, and red pepper.  Hot sauce.  Chili peppers.  Chili powder.  Chocolate and cocoa.   Alcohol.  Tea, coffee, and cola (regular and decaffeinated). The items listed above may not be a complete list of foods and beverages to avoid. Contact your dietitian for more information.   This information is not intended to replace advice given to you by your health care provider. Make sure you discuss any questions you have with your health care provider.   Document Released: 09/05/2011 Document Revised: 06/18/2013 Document Reviewed: 04/17/2013 Elsevier Interactive Patient Education 2016 Elsevier Inc. Peptic Ulcer A peptic ulcer is a sore in the lining of your esophagus (esophageal ulcer), stomach (gastric ulcer), or in the first part of your small intestine (duodenal ulcer). The ulcer causes erosion into the deeper tissue. CAUSES  Normally, the lining of the stomach and the small intestine protects itself from the acid that digests food. The protective lining can be damaged by:  An infection caused by a bacterium called Helicobacter pylori (H. pylori).  Regular use of nonsteroidal  anti-inflammatory drugs (NSAIDs), such as ibuprofen or aspirin.  Smoking tobacco. Other risk factors include being older than 13, drinking alcohol excessively, and having a family history of ulcer disease.  SYMPTOMS   Burning pain or gnawing in the area between the chest and the belly button.  Heartburn.  Nausea and vomiting.  Bloating. The pain can be worse on an empty stomach and at night. If the ulcer results in bleeding, it can cause:  Black, tarry stools.  Vomiting of bright red blood.  Vomiting of coffee-ground-looking materials. DIAGNOSIS  A diagnosis is usually made based upon your history and an exam. Other tests and procedures may be performed to find the cause of the ulcer. Finding a cause will help determine the best treatment. Tests and procedures may include:  Blood tests, stool tests, or breath tests to check for the bacterium H. pylori.  An upper gastrointestinal (GI) series of the esophagus, stomach, and small intestine.  An endoscopy to examine the esophagus, stomach, and small intestine.  A biopsy. TREATMENT  Treatment may include:  Eliminating the cause of the ulcer, such as smoking, NSAIDs, or alcohol.  Medicines to reduce the amount of acid in your digestive tract.  Antibiotic medicines if the ulcer is caused by the H. pylori bacterium.  An upper endoscopy to treat a bleeding ulcer.  Surgery if the bleeding is severe or if the ulcer created a hole somewhere in the digestive system. HOME CARE INSTRUCTIONS   Avoid tobacco, alcohol, and caffeine. Smoking can increase the acid in the stomach, and continued smoking will impair the healing of ulcers.  Avoid foods and drinks  that seem to cause discomfort or aggravate your ulcer.  Only take medicines as directed by your caregiver. Do not substitute over-the-counter medicines for prescription medicines without talking to your caregiver.  Keep any follow-up appointments and tests as directed. SEEK  MEDICAL CARE IF:   Your do not improve within 7 days of starting treatment.  You have ongoing indigestion or heartburn. SEEK IMMEDIATE MEDICAL CARE IF:   You have sudden, sharp, or persistent abdominal pain.  You have bloody or dark black, tarry stools.  You vomit blood or vomit that looks like coffee grounds.  You become light-headed, weak, or feel faint.  You become sweaty or clammy. MAKE SURE YOU:   Understand these instructions.  Will watch your condition.  Will get help right away if you are not doing well or get worse.   This information is not intended to replace advice given to you by your health care provider. Make sure you discuss any questions you have with your health care provider.   Document Released: 06/10/2000 Document Revised: 07/04/2014 Document Reviewed: 01/11/2012 Elsevier Interactive Patient Education 2016 Elsevier Inc. Dizziness Dizziness is a common problem. It is a feeling of unsteadiness or light-headedness. You may feel like you are about to faint. Dizziness can lead to injury if you stumble or fall. Anyone can become dizzy, but dizziness is more common in older adults. This condition can be caused by a number of things, including medicines, dehydration, or illness. HOME CARE INSTRUCTIONS Taking these steps may help with your condition: Eating and Drinking Drink enough fluid to keep your urine clear or pale yellow. This helps to keep you from becoming dehydrated. Try to drink more clear fluids, such as water. Do not drink alcohol. Limit your caffeine intake if directed by your health care provider. Limit your salt intake if directed by your health care provider. Activity Avoid making quick movements. Rise slowly from chairs and steady yourself until you feel okay. In the morning, first sit up on the side of the bed. When you feel okay, stand slowly while you hold onto something until you know that your balance is fine. Move your legs often if you  need to stand in one place for a long time. Tighten and relax your muscles in your legs while you are standing. Do not drive or operate heavy machinery if you feel dizzy. Avoid bending down if you feel dizzy. Place items in your home so that they are easy for you to reach without leaning over. Lifestyle Do not use any tobacco products, including cigarettes, chewing tobacco, or electronic cigarettes. If you need help quitting, ask your health care provider. Try to reduce your stress level, such as with yoga or meditation. Talk with your health care provider if you need help. General Instructions Watch your dizziness for any changes. Take medicines only as directed by your health care provider. Talk with your health care provider if you think that your dizziness is caused by a medicine that you are taking. Tell a friend or a family member that you are feeling dizzy. If he or she notices any changes in your behavior, have this person call your health care provider. Keep all follow-up visits as directed by your health care provider. This is important. SEEK MEDICAL CARE IF: Your dizziness does not go away. Your dizziness or light-headedness gets worse. You feel nauseous. You have reduced hearing. You have new symptoms. You are unsteady on your feet or you feel like the room is spinning. SEEK  IMMEDIATE MEDICAL CARE IF: You vomit or have diarrhea and are unable to eat or drink anything. You have problems talking, walking, swallowing, or using your arms, hands, or legs. You feel generally weak. You are not thinking clearly or you have trouble forming sentences. It may take a friend or family member to notice this. You have chest pain, abdominal pain, shortness of breath, or sweating. Your vision changes. You notice any bleeding. You have a headache. You have neck pain or a stiff neck. You have a fever.   This information is not intended to replace advice given to you by your health care  provider. Make sure you discuss any questions you have with your health care provider.   Document Released: 12/07/2000 Document Revised: 10/28/2014 Document Reviewed: 06/09/2014 Elsevier Interactive Patient Education [employer].

## 2015-12-01 NOTE — Telephone Encounter (Signed)
Referral is in process with another GI Physician due to pt being discharged from [facility].

## 2015-12-02 ENCOUNTER — Ambulatory Visit (INDEPENDENT_AMBULATORY_CARE_PROVIDER_SITE_OTHER): Payer: No Typology Code available for payment source | Admitting: Cardiology

## 2015-12-02 ENCOUNTER — Encounter: Payer: Self-pay | Admitting: Cardiology

## 2015-12-02 VITALS — BP 122/90 | HR 86 | Ht 62.0 in | Wt 181.8 lb

## 2015-12-02 DIAGNOSIS — R079 Chest pain, unspecified: Secondary | ICD-10-CM

## 2015-12-02 LAB — I-STAT TROPONIN, ED: TROPONIN I, POC: 0 ng/mL (ref 0.00–0.08)

## 2015-12-02 NOTE — Telephone Encounter (Signed)
Please make referral to [facility] - Dr. Earle Gell for pt per pt request. i put in referral order as well. Thanks.

## 2015-12-02 NOTE — Telephone Encounter (Signed)
Done   I spoke to patient yesterday and the referral was sent  Sent Referral to [facility] ph. # [phone] address [place] [address]. They will contact the patient to schedule an appointment with gi Dr Earle Gell

## 2015-12-02 NOTE — Patient Instructions (Signed)
Medication Instructions:  Your physician recommends that you continue on your current medications as directed. Please refer to the Current Medication list given to you today.  Labwork: None ordered  Testing/Procedures: Your physician has requested that you have en exercise stress myoview. For further information please visit [email or web address]. Please follow instruction sheet, as given.  Follow-Up: To be determined based upon results of stress testing.  We will call you with the results once it has been reviewed by the physician.  Thank you for choosing [facility]!!   Trinidad Curet, RN [phone]   Any Other Special Instructions Will Be Listed Below (If Applicable).

## 2015-12-04 ENCOUNTER — Telehealth: Payer: Self-pay

## 2015-12-04 ENCOUNTER — Other Ambulatory Visit: Payer: Self-pay | Admitting: Internal Medicine

## 2015-12-04 ENCOUNTER — Ambulatory Visit (INDEPENDENT_AMBULATORY_CARE_PROVIDER_SITE_OTHER): Payer: Self-pay | Admitting: Internal Medicine

## 2015-12-04 VITALS — BP 124/80 | HR 97 | Temp 98.4°F | Ht 63.35 in | Wt 181.0 lb

## 2015-12-04 DIAGNOSIS — R233 Spontaneous ecchymoses: Secondary | ICD-10-CM

## 2015-12-04 DIAGNOSIS — R5383 Other fatigue: Secondary | ICD-10-CM

## 2015-12-04 DIAGNOSIS — L304 Erythema intertrigo: Secondary | ICD-10-CM

## 2015-12-04 DIAGNOSIS — R21 Rash and other nonspecific skin eruption: Secondary | ICD-10-CM

## 2015-12-04 DIAGNOSIS — N200 Calculus of kidney: Secondary | ICD-10-CM

## 2015-12-04 DIAGNOSIS — R238 Other skin changes: Secondary | ICD-10-CM

## 2015-12-04 DIAGNOSIS — E876 Hypokalemia: Secondary | ICD-10-CM

## 2015-12-04 LAB — POCT CBC
GRANULOCYTE PERCENT: 75.6 % (ref 37–80)
HEMATOCRIT: 41.3 % (ref 37.7–47.9)
HEMOGLOBIN: 14.5 g/dL (ref 12.2–16.2)
Lymph, poc: 1.6 (ref 0.6–3.4)
MCH, POC: 30.4 pg (ref 27–31.2)
MCHC: 35 g/dL (ref 31.8–35.4)
MCV: 86.7 fL (ref 80–97)
MID (CBC): 0.4 (ref 0–0.9)
MPV: 7.9 fL (ref 0–99.8)
POC GRANULOCYTE: 6.1 (ref 2–6.9)
POC LYMPH PERCENT: 19.6 %L (ref 10–50)
POC MID %: 4.8 % (ref 0–12)
Platelet Count, POC: 286 10*3/uL (ref 142–424)
RBC: 4.76 M/uL (ref 4.04–5.48)
RDW, POC: 12.1 %
WBC: 8.1 10*3/uL (ref 4.6–10.2)

## 2015-12-04 MED ORDER — CLOTRIMAZOLE-BETAMETHASONE 1-0.05 % EX CREA: 1.0000 "application " | TOPICAL_CREAM | Freq: Two times a day (BID) | CUTANEOUS | Status: DC

## 2015-12-04 MED ORDER — CITALOPRAM HYDROBROMIDE 20 MG PO TABS: 20.0000 mg | ORAL_TABLET | Freq: Every day | ORAL | Status: DC

## 2015-12-04 NOTE — Telephone Encounter (Signed)
Pt was just seen here today 12/04/15 by Dr. Laney Pastor for Rash and nonspecific skin eruption. She was given clotrimazole-betamethasone (LOTRISONE) cream [id number]. She doesn't have insurance and this script costs to much for her to buy. She would like to be prescribed something similar to this, that costs less.  Meriel Flavors # [id number]  Pharmacy:  [facility] [address] [place]

## 2015-12-04 NOTE — Patient Instructions (Signed)
     IF you received an x-ray today, you will receive an invoice from [facility]. Please contact [facility] at [phone] with questions or concerns regarding your invoice.   IF you received labwork today, you will receive an invoice from [facility]. Please contact [facility] at [phone] with questions or concerns regarding your invoice.   Our billing staff will not be able to assist you with questions regarding bills from these companies.  You will be contacted with the lab results as soon as they are available. The fastest way to get your results is to activate your My Chart account. Instructions are located on the last page of this paperwork. If you have not heard from us regarding the results in 2 weeks, please contact this office.      

## 2015-12-04 NOTE — Progress Notes (Signed)
By signing my name below, I, [name], attest that this documentation has been prepared under the direction and in the presence of Tami Lin, MD.  Electronically Signed: [name], Medical Scribe. 12/04/2015. 1:06 PM.  Subjective:    Patient ID: Christina Fox, female    DOB: [birth year], 35 y.o.   MRN: [id number]  HPI  Chief Complaint  Patient presents with   Rash    x 1 day - arms, under breasts, legs   Allergic Reaction    pt stopped Biaxin, Carafate, Flagyl due to rash - undetermined if these are the cause    HPI Comments: Christina Fox is a 35 y.o. female who presents to the [facility] complaining of rash that started yesterday. Pt's rash started under her breast, and when she woke up today they were also on her legs. Pt woke up scratching in the middle of the night and found bumps "every where". Pt also has bruises on the lateral side of her thighs. Pt's LMP was on the 30th for about 2 days; she denies heavy bleeding because she is on birthcontrol that causes her to miss her period. She only had a period because she skipped her birthcontrol. Pt is experiencing fatigue, and appetite change. Pt hasn't been able to eat a lot- she got full after eating 2 crackers. Pt has been having chest and gastro problems for the past couple of months and she was given carafate- she stopped taking this.  Anxiety with panic attacks has been a long standing problem. Failed treatment with amitriptyline, paxil, and zoloft. Dr Federico Flake recently restarted kolopin QD which provided little relief. This is a daily event and sometimes interferes with her sleep. This is made worse by concerns that her health problems are due to underlying disease that will kill her like a CA.  Review of the chart indicates the most consistent finding has been hypokalemia dating back in 2009, even when she was not on diuretic. Although usually with nl bp, she has been dx with HTN on at  least 2 occasions with treatment started.  Patient Active Problem List   Diagnosis Date Noted   History of colonic polyps 11/27/2015   Right lower quadrant abdominal pain 11/27/2015   HTN (hypertension), benign 11/24/2015   Abdominal pain 11/16/2015   Headache 10/13/2015   Thyromegaly 11/03/2014   Anxiety disorder 11/03/2014   Past Medical History  Diagnosis Date   Headache(784.0)     sinus   GERD (gastroesophageal reflux disease)    Kidney stones     no current problem   Anxiety    Deviated septum 09/2011   Nasal turbinate hypertrophy 09/2011    bilat.   Complication of anesthesia     states small mouth   Pelvic inflammatory disease    Urinary tract infection    Migraine    Hypertension    Past Surgical History  Procedure Laterality Date   Colonoscopy  2007    [facility]: pedunculated 18mm polyp in distal sigmoid, sessile 47mm polyp at splenic flexure, larger polyp tubulovillous adenoma and smaller polyp tubular adenoma    Wisdom tooth extraction  2009   Nasal septoplasty w/ turbinoplasty  10/04/2011    Procedure: NASAL SEPTOPLASTY WITH TURBINATE REDUCTION;  Surgeon: Ascencion Dike, MD;  Location: [place];  Service: ENT;  Laterality: Bilateral;   Colonoscopy  09-09-15   Allergies  Allergen Reactions   Bentyl [Dicyclomine Hcl] Other (See Comments)  DIZZINESS   Haldol [Haloperidol] Other (See Comments)    Made her feel very jittery and agitated   Meclizine     Chest pain   Morphine And Related Other (See Comments)    Made her feel like she was losing her mind   Toradol [Ketorolac Tromethamine] Other (See Comments)    Made her feel very jittery and agitated   Macrobid [Nitrofurantoin Monohyd Macro] Rash   Sulfa Antibiotics Hives and Rash   Prior to Admission medications   Medication Sig Start Date End Date Taking? Authorizing Provider  clonazePAM (KLONOPIN) 0.5 MG tablet Take 1 tablet (0.5 mg total) by mouth daily. 10/20/15   Yes Robyn Haber, MD  meclizine (ANTIVERT) 25 MG tablet Take 1 tablet (25 mg total) by mouth 3 (three) times daily as needed for dizziness or nausea. 12/01/15  Yes Margarita Mail, PA-C  metroNIDAZOLE (FLAGYL) 500 MG tablet Take 1 tablet (500 mg total) by mouth 2 (two) times daily. One po bid x 7 days 12/01/15  Yes Abigail Harris, PA-C  montelukast (SINGULAIR) 10 MG tablet TAKE 1 TABLET BY MOUTH DAILY 09/07/15  Yes Lance Bosch, NP  Multiple Vitamins-Calcium (ONE-A-DAY WOMENS PO) Take 1 tablet by mouth every morning.   Yes Historical Provider, MD  pantoprazole (PROTONIX) 40 MG tablet Take 1 tablet (40 mg total) by mouth daily. 10/27/15  Yes Maren Reamer, MD  clarithromycin (BIAXIN) 250 MG tablet Take 1 tablet (250 mg total) by mouth 2 (two) times daily. Patient not taking: Reported on 12/04/2015 12/01/15   Margarita Mail, PA-C  levonorgestrel-ethinyl estradiol (AUBRA) 0.1-20 MG-MCG tablet Take 1 tablet by mouth daily. Reported on 12/04/2015    Historical Provider, MD  ondansetron (ZOFRAN ODT) 8 MG disintegrating tablet Take 1 tablet (8 mg total) by mouth every 8 (eight) hours as needed for nausea or vomiting. Patient not taking: Reported on 12/04/2015 11/17/15   Lajean Saver, MD  sucralfate (CARAFATE) 1 g tablet Take 1 tablet (1 g total) by mouth 4 (four) times daily. 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       Decreased appetite/chr postpran issues/hx polyps  Genitourinary: Negative for menstrual problem.       Retained stone  Musculoskeletal: Positive for myalgias.  Skin: Positive for rash. Negative for wound.  Neurological: Positive for light-headedness.  Psychiatric/Behavioral: Positive for sleep disturbance and dysphoric mood. The patient is nervous/anxious.      Objective:  BP 124/80 mmHg   Pulse 97   Temp(Src) 98.4 F (36.9 C) (Oral)   Ht 5' 3.35" (1.609 m)   Wt 181 lb (82.101  kg)   BMI 31.71 kg/m2   SpO2 98%   LMP 11/24/2015  Physical Exam  Constitutional: She appears well-developed and well-nourished. No distress.  HENT:  Head: Normocephalic and atraumatic.  Eyes: Conjunctivae are normal.  Neck: Neck supple.  Cardiovascular: Normal rate.   Pulmonary/Chest: Effort normal.  Neurological: She is alert.  Skin: Skin is warm and dry.  She has multiple bruises on her things bilaterally but no petechiae There are areas that are marked from scratching on her thighs She also has erythematous maculopapular lesions on her thighs Under her breast are two confluent erythematous areas  Psychiatric: She has a normal mood and affect. Her behavior is normal.  Nursing note and vitals reviewed.  Results for orders placed or performed in visit on 12/04/15  POCT CBC  Result Value Ref Range   WBC 8.1 4.6 - 10.2 K/uL   Lymph, poc 1.6 0.6 - 3.4   POC LYMPH PERCENT 19.6 10 - 50 %L   MID (cbc) 0.4 0 - 0.9   POC MID % 4.8 0 - 12 %M   POC Granulocyte 6.1 2 - 6.9   Granulocyte percent 75.6 37 - 80 %G   RBC 4.76 4.04 - 5.48 M/uL   Hemoglobin 14.5 12.2 - 16.2 g/dL   HCT, POC 41.3 37.7 - 47.9 %   MCV 86.7 80 - 97 fL   MCH, POC 30.4 27 - 31.2 pg   MCHC 35.0 31.8 - 35.4 g/dL   RDW, POC 12.1 %   Platelet Count, POC 286 142 - 424 K/uL   MPV 7.9 0 - 99.8  fL    Assessment & Plan:    Easy bruisability - without cause  Rash and nonspecific skin eruption--reaction to Biaxin suspected//no need for this so d/c. D/c flagyl as well  Intertrigo-lotrisone 2 weeks  Other fatigue--chronic recurrent ? Related to anxiety and poor sleep  Hypokalemia - Plan: Comprehensive metabolic panel, Magnesium, Parathyroid hormone (with Stone in kidney and hx ofGI and psych issues will get Parathyroid hormone) If no etiology will need consideration for mineralocortisol excess and Liddle's, or even distal RTA She will start KCl 64meq(she has)-reck 1 mo  GAD-londstanding--start celexa 20 daily///cont klon for now///f-u 94mo  Meds ordered this encounter  Medications   clotrimazole-betamethasone (LOTRISONE) cream    Sig: Apply 1 application topically 2 (two) times daily.    Dispense:  30 g    Refill:  0   I have completed the patient encounter in its entirety as documented by the scribe, with editing by me where necessary. Robert P. Laney Pastor, M.D.

## 2015-12-05 LAB — COMPREHENSIVE METABOLIC PANEL
ALBUMIN: 4.2 g/dL (ref 3.6–5.1)
ALK PHOS: 46 U/L (ref 33–115)
ALT: 111 U/L — AB (ref 6–29)
AST: 51 U/L — AB (ref 10–30)
BILIRUBIN TOTAL: 0.4 mg/dL (ref 0.2–1.2)
BUN: 3 mg/dL — ABNORMAL LOW (ref 7–25)
CALCIUM: 9.9 mg/dL (ref 8.6–10.2)
CO2: 20 mmol/L (ref 20–31)
Chloride: 103 mmol/L (ref 98–110)
Creat: 0.77 mg/dL (ref 0.50–1.10)
GLUCOSE: 94 mg/dL (ref 65–99)
POTASSIUM: 3.8 mmol/L (ref 3.5–5.3)
Sodium: 142 mmol/L (ref 135–146)
TOTAL PROTEIN: 7.3 g/dL (ref 6.1–8.1)

## 2015-12-05 LAB — MAGNESIUM: MAGNESIUM: 2.1 mg/dL (ref 1.5–2.5)

## 2015-12-07 LAB — PARATHYROID HORMONE, INTACT (NO CA): PTH: 18 pg/mL (ref 14–64)

## 2015-12-07 MED ORDER — KETOCONAZOLE 2 % EX CREA: 1.0000 "application " | TOPICAL_CREAM | Freq: Every day | CUTANEOUS | Status: DC

## 2015-12-07 NOTE — Telephone Encounter (Signed)
Pt advised.

## 2015-12-07 NOTE — Telephone Encounter (Signed)
Meds ordered this encounter  Medications  . ketoconazole (NIZORAL) 2 % cream    Sig: Apply 1 application topically daily.    Dispense:  15 g    Refill:  0    Order Specific Question:  Supervising Provider    Answer:  DOOLITTLE, ROBERT P [id number]

## 2015-12-07 NOTE — Telephone Encounter (Signed)
Labs sent via my chart

## 2015-12-07 NOTE — Telephone Encounter (Signed)
Dr. Laney Pastor can you review labs?

## 2015-12-08 ENCOUNTER — Telehealth: Payer: Self-pay | Admitting: Emergency Medicine

## 2015-12-08 ENCOUNTER — Telehealth (HOSPITAL_COMMUNITY): Payer: Self-pay | Admitting: *Deleted

## 2015-12-08 LAB — HEPATITIS C ANTIBODY: HCV Ab: NEGATIVE

## 2015-12-08 NOTE — Telephone Encounter (Signed)
Pt given lab results with instructions to decrease potassium supplements to twice a week Pt has scheduled appointment with Gertie Fey Informed we will notify with additional blood work results Hep C antibody added to blood work per [place]

## 2015-12-08 NOTE — Telephone Encounter (Signed)
Patient given detailed instructions per Myocardial Perfusion Study Information Sheet for the test on 12/14/15 at 1000. Patient notified to arrive 15 minutes early and that it is imperative to arrive on time for appointment to keep from having the test rescheduled.  If you need to cancel or reschedule your appointment, please call the office within 24 hours of your appointment. Failure to do so may result in a cancellation of your appointment, and a $50 no show fee. Patient verbalized understanding.Crystel Demarco, [place]

## 2015-12-08 NOTE — Telephone Encounter (Signed)
-----   Message from Leandrew Koyanagi, MD sent at 12/07/2015  6:59 PM EDT ----- Potassium back to normal so you won't need to take the supplement but twice a week. All other labs were normal except a slight elevation of liver enzymes of unclear etiology-we might think gall bladder but your recent tests were so reassuring(ultrasound and CT)---so the next stop will be evaluation by a gastroenterologist Dr Wynetta Emery!  Lab:Add hep c ab due to abn LFTs

## 2015-12-08 NOTE — Telephone Encounter (Signed)
Pt advised.

## 2015-12-12 ENCOUNTER — Ambulatory Visit (INDEPENDENT_AMBULATORY_CARE_PROVIDER_SITE_OTHER): Payer: Self-pay | Admitting: Emergency Medicine

## 2015-12-12 ENCOUNTER — Other Ambulatory Visit: Payer: Self-pay | Admitting: Emergency Medicine

## 2015-12-12 VITALS — BP 160/92 | HR 70 | Temp 98.0°F | Resp 15 | Ht 63.35 in | Wt 180.0 lb

## 2015-12-12 DIAGNOSIS — N9489 Other specified conditions associated with female genital organs and menstrual cycle: Secondary | ICD-10-CM

## 2015-12-12 DIAGNOSIS — R7989 Other specified abnormal findings of blood chemistry: Secondary | ICD-10-CM

## 2015-12-12 DIAGNOSIS — R350 Frequency of micturition: Secondary | ICD-10-CM

## 2015-12-12 DIAGNOSIS — R945 Abnormal results of liver function studies: Secondary | ICD-10-CM

## 2015-12-12 DIAGNOSIS — M545 Low back pain, unspecified: Secondary | ICD-10-CM

## 2015-12-12 DIAGNOSIS — R102 Pelvic and perineal pain: Secondary | ICD-10-CM

## 2015-12-12 LAB — POCT URINALYSIS DIPSTICK
Bilirubin, UA: NEGATIVE
GLUCOSE UA: NEGATIVE
Ketones, UA: NEGATIVE
Nitrite, UA: NEGATIVE
PH UA: 7
Protein, UA: NEGATIVE
SPEC GRAV UA: 1.01
UROBILINOGEN UA: 0.2

## 2015-12-12 LAB — POCT UA - MICROSCOPIC ONLY: MUCUS UA: ABSENT

## 2015-12-12 LAB — POCT URINE PREGNANCY: PREG TEST UR: NEGATIVE

## 2015-12-12 MED ORDER — PHENAZOPYRIDINE HCL 95 MG PO TABS: 95.0000 mg | ORAL_TABLET | Freq: Three times a day (TID) | ORAL | Status: DC | PRN

## 2015-12-12 MED ORDER — SUCRALFATE 1 GM/10ML PO SUSP: 1.0000 g | Freq: Three times a day (TID) | ORAL | Status: DC

## 2015-12-12 NOTE — Progress Notes (Signed)
By signing my name below, I, Christina Fox, attest that this documentation has been prepared under the direction and in the presence of Christina Queen, MD.  Electronically Signed: Verlee Fox, Medical Scribe. 12/12/2015. 1:28 PM.  Chief Complaint:  Chief Complaint  Patient presents with  . Urinary Frequency  . Back Pain    HPI: Christina Fox is a 35 y.o. female who reports to [facility] today complaining of urinary frequency. Pt reports associated symptoms of lower back pain onset 2 days ago, and suprapubic pain. Pt states it feels like a kidney infection- she's had them in the past Pt isn't sleeping because she's been peeing so much. Pt's stool has been in pellet form.  Pt denies diarrhea.  Pt has had gastro issues for the past 2 months. Pt reports this has caused continuous chest, abdominal, and shoulder blade pain- onset 2 months ago. Pt has been out of work for a month. Pt lost about 30 lbs since she's been sick. Pt states when she tries to swallow she chockes. Pt states it feels like food is getting stuck in her throat. Pt is waiting for the gastroneurologist June 29th with Dr. Edwina Barth. Pt had X-Ray, MRI, CT, and [place]. She had an US of the abdomen which showed a 4 mm abnormal around in the gallbladder which was either a pylop or stone. She had a CT of the abdomen which showed a stable non-obstructing sone in the right kidney and she's had a CXR which was nl. Pt was told she had a gall stone. Pt was put on Biaxin and she had a allergic rxn. Pt has a stress test Monday June 29th with Dr. Edwina Barth  Pt takes birthcontrol and doesn't have a menses. When pt got sick she skipped her birth control and had a light menses. Pt is waiting for her GYN trip due to a noncancerous lesion found during her pap smear last month.  Pts grandpa had colon CA    Past Medical History  Diagnosis Date  . Headache(784.0)     sinus  . GERD (gastroesophageal reflux disease)   . Kidney stones     no current  problem  . Anxiety   . Deviated septum 09/2011  . 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  . Complication of anesthesia     states small mouth  . Pelvic inflammatory disease   . Urinary tract infection   . Migraine   . Hypertension    Past Surgical History  Procedure Laterality Date  . Colonoscopy  2007    [facility]: pedunculated 50mm polyp in distal sigmoid, sessile 4mm polyp at splenic flexure, larger polyp tubulovillous adenoma and smaller polyp tubular adenoma   . Wisdom tooth extraction  2009  . Nasal septoplasty w/ turbinoplasty  10/04/2011    Procedure: NASAL SEPTOPLASTY WITH TURBINATE REDUCTION;  Surgeon: Ascencion Dike, MD;  Location: [place];  Service: ENT;  Laterality: Bilateral;  . Colonoscopy  09-09-15   Social History   Social History  . Marital Status: Single    Spouse Name: N/A  . Number of Children: 4  . Years of Education: 11   Occupational History  . 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 Smoking status: Former Smoker -- 0.25 packs/day    Quit date: 08/23/2009  . Smokeless tobacco: Never Used  . Alcohol Use: No  . Drug Use: No  . Sexual Activity: Not Currently  Birth Control/ Protection: Pill   Other Topics Concern  . None   Social History Narrative   Lives at home w/ her children   Right-handed   Drinks occasional sweet tea   Family History  Problem Relation Age of Onset  . Kidney cancer Mother   . Hypertension Mother   . Migraines Mother   . Multiple sclerosis Mother   . Hypertension Father   . Liver disease Father     liver transplant; had fatty liver disease  . Spina bifida Brother   . Hypertension Other   . Migraines Maternal Grandmother   . Colon cancer Paternal Grandfather   . Fibromyalgia Mother   . Liver disease Mother     NASH    Allergies  Allergen Reactions  . Bentyl [Dicyclomine Hcl] Other (See Comments)    DIZZINESS  . Haldol [Haloperidol] Other (See Comments)    Made her  feel very jittery and agitated  . Meclizine     Chest pain  . Morphine And Related Other (See Comments)    Made her feel like she was losing her mind  . Toradol [Ketorolac Tromethamine] Other (See Comments)    Made her feel very jittery and agitated  . Macrobid [Nitrofurantoin Monohyd Macro] Rash  . Sulfa Antibiotics Hives and Rash   Prior to Admission medications   Medication Sig Start Date End Date Taking? Authorizing Provider  citalopram (CELEXA) 20 MG tablet Take 1 tablet (20 mg total) by mouth daily. 12/04/15   Leandrew Koyanagi, MD  clonazePAM (KLONOPIN) 0.5 MG tablet Take 1 tablet (0.5 mg total) by mouth daily. 10/20/15   Robyn Haber, MD  levonorgestrel-ethinyl estradiol [place]) 0.1-20 MG-MCG tablet Take 1 tablet by mouth daily. Reported on 12/04/2015    Historical Provider, MD  meclizine (ANTIVERT) 25 MG tablet Take 1 tablet (25 mg total) by mouth 3 (three) times daily as needed for dizziness or nausea. 12/01/15   Margarita Mail, PA-C  montelukast (SINGULAIR) 10 MG tablet TAKE 1 TABLET BY MOUTH DAILY 09/07/15   Lance Bosch, NP  Multiple Vitamins-Calcium (ONE-A-DAY WOMENS PO) Take 1 tablet by mouth every morning.    Historical Provider, MD  pantoprazole (PROTONIX) 40 MG tablet Take 1 tablet (40 mg total) by mouth daily. 10/27/15   Maren Reamer, MD  sucralfate (CARAFATE) 1 g tablet Take 1 tablet (1 g total) by mouth 4 (four) times daily. Patient not taking: Reported on 12/04/2015 12/01/15   Margarita Mail, PA-C     ROS: The patient denies fevers, chills, night sweats, dysuria,, palpitations, wheezing, dyspnea on exertion, nausea, vomiting,  hematuria, melena, numbness, weakness, or tingling. Pt reports unintentional weight loss, abdominal pain, and chest pain. All other systems have been reviewed and were otherwise negative with the exception of those mentioned in the HPI and as above.    PHYSICAL EXAM: Filed Vitals:   12/12/15 1223  BP: 160/92  Pulse: 70  Temp: 98 F (36.7 C)    Resp: 15   Body mass index is 31.54 kg/(m^2).   General: Alert, no acute distress HEENT:  Normocephalic, atraumatic, oropharynx patent. No CVA tenderness. Eye: [facility] Cardiovascular:  Regular rate and rhythm, no rubs murmurs or gallops.  No Carotid bruits, radial pulse intact. No pedal edema.  Respiratory: Clear to auscultation bilaterally.  No wheezes, rales, or rhonchi.  No cyanosis, no use of accessory musculature Abdominal: No organomegaly, abdomen is soft, positive bowel sounds.  No masses.Suprapubic tenderness. No rebound. Musculoskeletal: Gait intact. No edema,  tenderness Skin: No rashes. Neurologic: Facial musculature symmetric. Psychiatric: Patient acts appropriately throughout our interaction. Lymphatic: No cervical or submandibular lymphadenopathy  LABS: Results for orders placed or performed in visit on 12/12/15  POCT UA - Microscopic Only  Result Value Ref Range   WBC, Ur, HPF, POC None    RBC, urine, microscopic None    Bacteria, U Microscopic Few    Mucus, UA Absent    Epithelial cells, urine per micros Few    Crystals, Ur, HPF, POC     Casts, Ur, LPF, POC     Yeast, UA    POCT urinalysis dipstick  Result Value Ref Range   Color, UA Straw    Clarity, UA Clear    Glucose, UA Negative    Bilirubin, UA Negative    Ketones, UA Negative    Spec Grav, UA 1.010    Blood, UA Trace-Lysed    pH, UA 7.0    Protein, UA Negative    Urobilinogen, UA 0.2    Nitrite, UA Negative    Leukocytes, UA Trace (A) Negative  POCT urine pregnancy  Result Value Ref Range   Preg Test, Ur Negative Negative   EKG/XRAY:   Primary read interpreted by Dr. Everlene Farrier at [facility].   ASSESSMENT/PLAN: Patient has a lot going on right now. She is scheduled for stress test on Monday the cardiologist. She is scheduled to see the urologist the end of next week. She also has an appointment with GI to consider endoscopy. She does not definitely have gallstones. She does have a small  nonobstructing stone of the right kidney but urine is very unremarkable. I did send a urine culture. I did change her Carafate to a suspension. She was placed on Pyridium for urinary symptoms..I personally performed the services described in this documentation, which was scribed in my presence. The recorded information has been reviewed and is accurate.   Gross sideeffects, risk and benefits, and alternatives of medications d/w patient. Patient is aware that all medications have potential sideeffects and we are unable to predict every sideeffect or drug-drug interaction that may occur.  Christina Queen MD 12/12/2015 1:28 PM

## 2015-12-12 NOTE — Patient Instructions (Addendum)
This please strain your urine. I have given you a prescription to help with the urgency and frequency of urination. I have done a culture and will call you with these results.    IF you received an x-ray today, you will receive an invoice from [facility]. Please contact [facility] at [phone] with questions or concerns regarding your invoice.   IF you received labwork today, you will receive an invoice from [employer]. Please contact [facility] at [phone] with questions or concerns regarding your invoice.   Our billing staff will not be able to assist you with questions regarding bills from these companies.  You will be contacted with the lab results as soon as they are available. The fastest way to get your results is to activate your My Chart account. Instructions are located on the last page of this paperwork. If you have not heard from [place] regarding the results in 2 weeks, please contact this office.

## 2015-12-13 LAB — HEPATIC FUNCTION PANEL
ALK PHOS: 47 U/L (ref 33–115)
ALT: 120 U/L — ABNORMAL HIGH (ref 6–29)
AST: 55 U/L — ABNORMAL HIGH (ref 10–30)
Albumin: 4.4 g/dL (ref 3.6–5.1)
BILIRUBIN DIRECT: 0.1 mg/dL (ref <=0.2)
BILIRUBIN INDIRECT: 0.5 mg/dL (ref 0.2–1.2)
TOTAL PROTEIN: 7 g/dL (ref 6.1–8.1)
Total Bilirubin: 0.6 mg/dL (ref 0.2–1.2)

## 2015-12-13 LAB — URINE CULTURE
Colony Count: NO GROWTH
Organism ID, Bacteria: NO GROWTH

## 2015-12-13 LAB — HEPATITIS C ANTIBODY: HCV AB: NEGATIVE

## 2015-12-14 ENCOUNTER — Ambulatory Visit (HOSPITAL_COMMUNITY): Payer: No Typology Code available for payment source | Attending: Internal Medicine

## 2015-12-14 DIAGNOSIS — I10 Essential (primary) hypertension: Secondary | ICD-10-CM | POA: Insufficient documentation

## 2015-12-14 DIAGNOSIS — R079 Chest pain, unspecified: Secondary | ICD-10-CM | POA: Insufficient documentation

## 2015-12-14 LAB — MYOCARDIAL PERFUSION IMAGING
CSEPEDS: 0 s
CSEPEW: 8.5 METS
Exercise duration (min): 7 min
MPHR: 185 {beats}/min
Peak HR: 171 {beats}/min
Percent HR: 92 %
Rest HR: 60 {beats}/min

## 2015-12-14 MED ORDER — TECHNETIUM TC 99M TETROFOSMIN IV KIT
31.9000 | PACK | Freq: Once | INTRAVENOUS | Status: AC | PRN
  Administered 2015-12-14: 31.9 via INTRAVENOUS
  Filled 2015-12-14: qty 32

## 2015-12-14 MED ORDER — TECHNETIUM TC 99M TETROFOSMIN IV KIT
10.1000 | PACK | Freq: Once | INTRAVENOUS | Status: AC | PRN
  Administered 2015-12-14: 10.1 via INTRAVENOUS
  Filled 2015-12-14: qty 10

## 2015-12-14 MED FILL — ?MONTELUKAST SOD 10 MG TAB: 10 | 30 days supply | Qty: 30 | Fill #3

## 2015-12-14 MED FILL — SUCRALFATE 1 GM TABLET: 1 | 22 days supply | Qty: 90 | Fill #0

## 2015-12-16 ENCOUNTER — Telehealth: Payer: Self-pay | Admitting: Emergency Medicine

## 2015-12-16 NOTE — Telephone Encounter (Signed)
Pt given test results Scheduled to see GI Dr Hassell Done 6/29.  EBV titers requires 48 hrs, overdue/ ANA testing added Primary school teacher,  [place] verified

## 2015-12-16 NOTE — Telephone Encounter (Signed)
-----   Message from Darlyne Russian, MD sent at 12/14/2015  7:34 AM EDT ----- Call patient her liver tests continue to be elevated. Please send a copy of these tests to the GI specialist she is seeing this week. See if we can add an ANA to bloodwork at the lab as well as EBV titers.

## 2015-12-17 LAB — ANA: Anti Nuclear Antibody(ANA): NEGATIVE

## 2015-12-17 NOTE — Telephone Encounter (Signed)
Spoke with Threasa Beards, she faxed over all the labs to Dr. Hassell Done for appt.

## 2015-12-17 NOTE — Telephone Encounter (Signed)
Be sure we have sent all of the lab work over to Dr. Hassell Done. Please be sure the patient requests Dr. Hassell Done to send [place] a copy of his evaluation. Thank you this is the Dr. Hassell Done who is a gastroenterologist.

## 2015-12-18 ENCOUNTER — Other Ambulatory Visit (HOSPITAL_COMMUNITY)
Admission: RE | Admit: 2015-12-18 | Discharge: 2015-12-18 | Disposition: A | Discharge status: Home / Self Care | Payer: No Typology Code available for payment source | Source: Other Acute Inpatient Hospital | Attending: Urology | Admitting: Urology

## 2015-12-18 ENCOUNTER — Ambulatory Visit (INDEPENDENT_AMBULATORY_CARE_PROVIDER_SITE_OTHER): Payer: No Typology Code available for payment source | Admitting: Urology

## 2015-12-18 DIAGNOSIS — R319 Hematuria, unspecified: Secondary | ICD-10-CM | POA: Insufficient documentation

## 2015-12-18 DIAGNOSIS — N200 Calculus of kidney: Secondary | ICD-10-CM

## 2015-12-18 DIAGNOSIS — R3121 Asymptomatic microscopic hematuria: Secondary | ICD-10-CM

## 2015-12-18 DIAGNOSIS — R350 Frequency of micturition: Secondary | ICD-10-CM

## 2015-12-18 LAB — URINALYSIS, ROUTINE W REFLEX MICROSCOPIC
Bilirubin Urine: NEGATIVE
GLUCOSE, UA: NEGATIVE mg/dL
Hgb urine dipstick: NEGATIVE
Ketones, ur: NEGATIVE mg/dL
NITRITE: NEGATIVE
PH: 5.5 (ref 5.0–8.0)
Protein, ur: NEGATIVE mg/dL

## 2015-12-18 LAB — URINE MICROSCOPIC-ADD ON: RBC / HPF: NONE SEEN RBC/hpf (ref 0–5)

## 2015-12-22 ENCOUNTER — Ambulatory Visit: Payer: No Typology Code available for payment source | Attending: Internal Medicine | Admitting: Internal Medicine

## 2015-12-22 ENCOUNTER — Ambulatory Visit: Admit: 2015-12-22 | Payer: Medicaid Other | Admitting: General Surgery

## 2015-12-22 ENCOUNTER — Encounter: Payer: Self-pay | Admitting: Internal Medicine

## 2015-12-22 VITALS — BP 148/87 | HR 86 | Temp 98.3°F | Wt 180.6 lb

## 2015-12-22 DIAGNOSIS — R945 Abnormal results of liver function studies: Secondary | ICD-10-CM

## 2015-12-22 DIAGNOSIS — Z79899 Other long term (current) drug therapy: Secondary | ICD-10-CM | POA: Insufficient documentation

## 2015-12-22 DIAGNOSIS — B3731 Acute candidiasis of vulva and vagina: Secondary | ICD-10-CM

## 2015-12-22 DIAGNOSIS — K219 Gastro-esophageal reflux disease without esophagitis: Secondary | ICD-10-CM | POA: Insufficient documentation

## 2015-12-22 DIAGNOSIS — Z87442 Personal history of urinary calculi: Secondary | ICD-10-CM | POA: Insufficient documentation

## 2015-12-22 DIAGNOSIS — R7989 Other specified abnormal findings of blood chemistry: Secondary | ICD-10-CM

## 2015-12-22 DIAGNOSIS — R1011 Right upper quadrant pain: Secondary | ICD-10-CM | POA: Insufficient documentation

## 2015-12-22 DIAGNOSIS — G8929 Other chronic pain: Secondary | ICD-10-CM

## 2015-12-22 DIAGNOSIS — R1010 Upper abdominal pain, unspecified: Secondary | ICD-10-CM

## 2015-12-22 DIAGNOSIS — B373 Candidiasis of vulva and vagina: Secondary | ICD-10-CM

## 2015-12-22 DIAGNOSIS — Z13220 Encounter for screening for lipoid disorders: Secondary | ICD-10-CM

## 2015-12-22 SURGERY — COLONOSCOPY WITH PROPOFOL
Anesthesia: General

## 2015-12-22 MED ORDER — FEXOFENADINE HCL 180 MG PO TABS: 180.0000 mg | ORAL_TABLET | Freq: Every day | ORAL | Status: DC

## 2015-12-22 MED ORDER — FLUTICASONE PROPIONATE 50 MCG/ACT NA SUSP: 2.0000 | Freq: Every day | NASAL | Status: DC

## 2015-12-22 MED ORDER — NYSTATIN 100000 UNIT/GM EX POWD: Freq: Four times a day (QID) | CUTANEOUS | Status: DC

## 2015-12-22 MED ORDER — FLUCONAZOLE 150 MG PO TABS: 150.0000 mg | ORAL_TABLET | Freq: Every day | ORAL | Status: DC

## 2015-12-22 MED FILL — FLUCONAZOLE 150 MG TABLET: 150 | 1 days supply | Qty: 1 | Fill #0

## 2015-12-22 MED FILL — NYSTOP 100,000 UNITS/GM PWD: 100000 | 10 days supply | Qty: 15 | Fill #0

## 2015-12-22 MED FILL — ?FEXOFENADINE HCL 180 MG TA: 180 | 30 days supply | Qty: 30 | Fill #0

## 2015-12-22 NOTE — Progress Notes (Addendum)
Christina Fox, is a 35 y.o. female  [id number]  [id number]  DOB - 06-23-1981  Chief Complaint  Patient presents with  . Follow-up    4 weeks        Subjective:   Christina Fox is a 35 y.o. female here today for a follow up visit of her Ruq abd pain.  Has seen numerous doctors recently, including cardiology, gi, surgery, ent, urology, family practice/urgent care, and numerous ED visits for same.  She has appt w/ Dr Earle Gell Gi this Thursday as well as Dr Theo/ent.  C/o of ruq pain that radiates to her shoulders, constant in nature.  No cp/sob.    -  Recent stress test 6/19 was negative.   Patient has No headache, No chest pain, No abdominal pain - No Nausea, No new weakness tingling or numbness, No Cough - SOB.  No problems updated.  ALLERGIES: Allergies  Allergen Reactions  . Bentyl [Dicyclomine Hcl] Other (See Comments)    DIZZINESS  . Haldol [Haloperidol] Other (See Comments)    Made her feel very jittery and agitated  . Meclizine     Chest pain  . Morphine And Related Other (See Comments)    Made her feel like she was losing her mind  . Toradol [Ketorolac Tromethamine] Other (See Comments)    Made her feel very jittery and agitated  . Macrobid [Nitrofurantoin Monohyd Macro] Rash  . Sulfa Antibiotics Hives and Rash    PAST MEDICAL HISTORY: Past Medical History  Diagnosis Date  . Headache(784.0)     sinus  . GERD (gastroesophageal reflux disease)   . Kidney stones     no current problem  . Anxiety   . Deviated septum 09/2011  . 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  . Complication of anesthesia     states small mouth  . Pelvic inflammatory disease   . Urinary tract infection   . Migraine   . Hypertension     MEDICATIONS AT HOME: Prior to Admission medications   Medication Sig Start Date End Date Taking? Authorizing Provider  clonazePAM (KLONOPIN) 0.5 MG tablet Take 1 tablet (0.5 mg total) by mouth daily. 10/20/15  Yes  Robyn Haber, MD  levonorgestrel-ethinyl estradiol (AUBRA) 0.1-20 MG-MCG tablet Take 1 tablet by mouth daily. Reported on 12/04/2015   Yes Historical Provider, MD  montelukast (SINGULAIR) 10 MG tablet TAKE 1 TABLET BY MOUTH DAILY 09/07/15  Yes Lance Bosch, NP  Multiple Vitamins-Calcium (ONE-A-DAY WOMENS PO) Take 1 tablet by mouth every morning.   Yes Historical Provider, MD  pantoprazole (PROTONIX) 40 MG tablet Take 1 tablet (40 mg total) by mouth daily. 10/27/15  Yes Maren Reamer, MD  fexofenadine (ALLEGRA) 180 MG tablet Take 1 tablet (180 mg total) by mouth daily. 12/22/15   Maren Reamer, MD  fluticasone (FLONASE) 50 MCG/ACT nasal spray Place 2 sprays into both nostrils daily. 12/22/15   Maren Reamer, MD  meclizine (ANTIVERT) 25 MG tablet Take 1 tablet (25 mg total) by mouth 3 (three) times daily as needed for dizziness or nausea. Patient not taking: Reported on 12/22/2015 12/01/15   Margarita Mail, PA-C  phenazopyridine (PYRIDIUM) 95 MG tablet Take 1 tablet (95 mg total) by mouth 3 (three) times daily as needed for pain. Patient not taking: Reported on 12/22/2015 12/12/15   Darlyne Russian, MD  sucralfate (CARAFATE) 1 GM/10ML suspension Take 10 mLs (1 g total) by mouth 4 (four) times daily -  with meals and  at bedtime. Patient not taking: Reported on 12/22/2015 12/12/15   Darlyne Russian, MD     Objective:   Filed Vitals:   12/22/15 1623  BP: 148/87  Pulse: 86  Temp: 98.3 F (36.8 C)  TempSrc: Oral  Weight: 180 lb 9.6 oz (81.92 kg)    Exam General appearance : Awake, alert, not in any distress. Speech Clear. Not toxic looking, good spirits HEENT: Atraumatic and Normocephalic, pupils equally reactive to light. Neck: supple, no JVD.  Chest:Good air entry bilaterally, no added sounds. CVS: S1 S2 regular, no murmurs/gallups or rubs. Abdomen: Bowel sounds active, Non tender and not distended with no gaurding, rigidity or rebound. Extremities: B/L Lower Ext shows no edema, both legs  are warm to touch Neurology: Awake alert, and oriented X 3, CN II-XII grossly intact, Non focal Skin:No Rash  Data Review Lab Results  Component Value Date   HGBA1C 5.2 10/27/2015    Depression screen [facility] 2/9 12/22/2015 12/12/2015 12/04/2015 12/01/2015 11/24/2015  Decreased Interest 1 0 0 0 0  Down, Depressed, Hopeless 1 0 0 0 0  PHQ - 2 Score 2 0 0 0 0  Altered sleeping 2 - - 0 1  Tired, decreased energy 2 - - 0 0  Change in appetite 2 - - 0 0  Feeling bad or failure about yourself  0 - - 0 0  Trouble concentrating 0 - - 0 0  Moving slowly or fidgety/restless 0 - - 0 0  Suicidal thoughts 0 - - 0 0  PHQ-9 Score 8 - - 0 1  Difficult doing work/chores Not difficult at all - - Not difficult at all Not difficult at all    12/14/15 nuclear stress test Study Highlights     Nuclear stress EF: 59%.  T wave inversion was noted during stress in the II, III, aVF, V4, V5 and V6 leads.  Horizontal ST segment depression ST segment depression of 2 mm was noted during stress in the V4, V5 and V6 leads.  The study is normal.  This is a low risk study.  The left ventricular ejection fraction is normal (55-65%).     Assessment & Plan   1. Abdominal pain, chronic, right upper quadrant - Numerous specialist working this up, likely GI, going to see Dr Abbe [place] this Thurseday. - continue ppi  2. Screening cholesterol level Currently fasting. - Lipid Panel  3. LFTs abnormal, recently - Hepatic function panel  4. Anxiety? Panic d/o? [place]? She has not seen Psychiatry yet, has not started celexa at all, taking Klonopin 0.5 bid (not prescribed by our clinic).  5. Numerous MDs following/ - recent stress test negative, not cardiac. - pt states uses Urgent care/Family practice when our clinic is not open.   Patient have been counseled extensively about nutrition and exercise  Return in about 3 months (around 03/23/2016), or if symptoms worsen or fail to improve.  The patient  was given clear instructions to go to ER or return to medical center if symptoms don't improve, worsen or new problems develop. The patient verbalized understanding. The patient was told to call to get lab results if they haven't heard anything in the next week.   This note has been created with Surveyor, quantity. Any transcriptional errors are unintentional.   Maren Reamer, MD, [facility] [place], [place]   12/22/2015, 4:45 PM    511pm: Addendum/ Pt asked to see  me after visit for c/o of vaginal yeast infection, mild discharge. - trial nystatin powder and diflucan 150mg  po x 1

## 2015-12-22 NOTE — Addendum Note (Signed)
Addended byLottie Mussel T on: 12/22/2015 05:11 PM   Modules accepted: Orders

## 2015-12-22 NOTE — Patient Instructions (Addendum)
Food Choices for Gastroesophageal Reflux Disease, Adult When you have gastroesophageal reflux disease (GERD), the foods you eat and your eating habits are very important. Choosing the right foods can help ease the discomfort of GERD. WHAT GENERAL GUIDELINES DO I NEED TO FOLLOW?  Choose fruits, vegetables, whole grains, low-fat dairy products, and low-fat meat, fish, and poultry.  Limit fats such as oils, salad dressings, butter, nuts, and avocado.  Keep a food diary to identify foods that cause symptoms.  Avoid foods that cause reflux. These may be different for different people.  Eat frequent small meals instead of three large meals each day.  Eat your meals slowly, in a relaxed setting.  Limit fried foods.  Cook foods using methods other than frying.  Avoid drinking alcohol.  Avoid drinking large amounts of liquids with your meals.  Avoid bending over or lying down until 2-3 hours after eating. WHAT FOODS ARE NOT RECOMMENDED? The following are some foods and drinks that may worsen your symptoms: Vegetables Tomatoes. Tomato juice. Tomato and spaghetti sauce. Chili peppers. Onion and garlic. Horseradish. Fruits Oranges, grapefruit, and lemon (fruit and juice). Meats High-fat meats, fish, and poultry. This includes hot dogs, ribs, ham, sausage, salami, and bacon. Dairy Whole milk and chocolate milk. Sour cream. Cream. Butter. Ice cream. Cream cheese.  Beverages Coffee and tea, with or without caffeine. Carbonated beverages or energy drinks. Condiments Hot sauce. Barbecue sauce.  Sweets/Desserts Chocolate and cocoa. Donuts. Peppermint and spearmint. Fats and Oils High-fat foods, including [place] fries and potato chips. Other Vinegar. Strong spices, such as black pepper, white pepper, red pepper, cayenne, curry powder, cloves, ginger, and chili powder. The items listed above may not be a complete list of foods and beverages to avoid. Contact your dietitian for more  information.   This information is not intended to replace advice given to you by your health care provider. Make sure you discuss any questions you have with your health care provider.   Document Released: 06/13/2005 Document Revised: 07/04/2014 Document Reviewed: 04/17/2013 Elsevier Interactive Patient Education 2016 Elsevier Inc.   - Allergic Rhinitis Allergic rhinitis is when the mucous membranes in the nose respond to allergens. Allergens are particles in the air that cause your body to have an allergic reaction. This causes you to release allergic antibodies. Through a chain of events, these eventually cause you to release histamine into the blood stream. Although meant to protect the body, it is this release of histamine that causes your discomfort, such as frequent sneezing, congestion, and an itchy, runny nose.  CAUSES Seasonal allergic rhinitis (hay fever) is caused by pollen allergens that may come from grasses, trees, and weeds. Year-round allergic rhinitis (perennial allergic rhinitis) is caused by allergens such as house dust mites, pet dander, and mold spores. SYMPTOMS  Nasal stuffiness (congestion).  Itchy, runny nose with sneezing and tearing of the eyes. DIAGNOSIS Your health care provider can help you determine the allergen or allergens that trigger your symptoms. If you and your health care provider are unable to determine the allergen, skin or blood testing may be used. Your health care provider will diagnose your condition after taking your health history and performing a physical exam. Your health care provider may assess you for other related conditions, such as asthma, pink eye, or an ear infection. TREATMENT Allergic rhinitis does not have a cure, but it can be controlled by:  Medicines that block allergy symptoms. These may include allergy shots, nasal sprays, and oral antihistamines.  Avoiding the  allergen. Hay fever may often be treated with antihistamines in  pill or nasal spray forms. Antihistamines block the effects of histamine. There are over-the-counter medicines that may help with nasal congestion and swelling around the eyes. Check with your health care provider before taking or giving this medicine. If avoiding the allergen or the medicine prescribed do not work, there are many new medicines your health care provider can prescribe. Stronger medicine may be used if initial measures are ineffective. Desensitizing injections can be used if medicine and avoidance does not work. Desensitization is when a patient is given ongoing shots until the body becomes less sensitive to the allergen. Make sure you follow up with your health care provider if problems continue. HOME CARE INSTRUCTIONS It is not possible to completely avoid allergens, but you can reduce your symptoms by taking steps to limit your exposure to them. It helps to know exactly what you are allergic to so that you can avoid your specific triggers. SEEK MEDICAL CARE IF:  You have a fever.  You develop a cough that does not stop easily (persistent).  You have shortness of breath.  You start wheezing.  Symptoms interfere with normal daily activities.   This information is not intended to replace advice given to you by your health care provider. Make sure you discuss any questions you have with your health care provider.   Document Released: 03/08/2001 Document Revised: 07/04/2014 Document Reviewed: 02/18/2013 Elsevier Interactive Patient Education [employer].

## 2015-12-23 LAB — LIPID PANEL
CHOLESTEROL: 153 mg/dL (ref 125–200)
HDL: 59 mg/dL (ref >=46)
LDL Cholesterol: 74 mg/dL (ref <130)
TRIGLYCERIDES: 99 mg/dL (ref <150)
Total CHOL/HDL Ratio: 2.6 Ratio (ref <=5.0)
VLDL: 20 mg/dL (ref <30)

## 2015-12-23 LAB — HEPATIC FUNCTION PANEL
ALT: 277 U/L — AB (ref 6–29)
AST: 103 U/L — ABNORMAL HIGH (ref 10–30)
Albumin: 4.3 g/dL (ref 3.6–5.1)
Alkaline Phosphatase: 46 U/L (ref 33–115)
BILIRUBIN DIRECT: 0.1 mg/dL (ref <=0.2)
Indirect Bilirubin: 0.4 mg/dL (ref 0.2–1.2)
TOTAL PROTEIN: 6.8 g/dL (ref 6.1–8.1)
Total Bilirubin: 0.5 mg/dL (ref 0.2–1.2)

## 2015-12-24 ENCOUNTER — Ambulatory Visit (INDEPENDENT_AMBULATORY_CARE_PROVIDER_SITE_OTHER): Payer: No Typology Code available for payment source | Admitting: Otolaryngology

## 2015-12-24 DIAGNOSIS — J343 Hypertrophy of nasal turbinates: Secondary | ICD-10-CM

## 2015-12-24 DIAGNOSIS — H6982 Other specified disorders of Eustachian tube, left ear: Secondary | ICD-10-CM

## 2015-12-24 DIAGNOSIS — J310 Chronic rhinitis: Secondary | ICD-10-CM

## 2015-12-30 ENCOUNTER — Ambulatory Visit
Admission: RE | Admit: 2015-12-30 | Discharge: 2015-12-30 | Disposition: A | Discharge status: Home / Self Care | Payer: No Typology Code available for payment source | Source: Ambulatory Visit | Attending: Gastroenterology | Admitting: Gastroenterology

## 2015-12-30 ENCOUNTER — Other Ambulatory Visit: Payer: Self-pay | Admitting: Gastroenterology

## 2015-12-30 DIAGNOSIS — R748 Abnormal levels of other serum enzymes: Secondary | ICD-10-CM

## 2015-12-30 DIAGNOSIS — K8020 Calculus of gallbladder without cholecystitis without obstruction: Secondary | ICD-10-CM

## 2015-12-30 MED ORDER — GADOBENATE DIMEGLUMINE 529 MG/ML IV SOLN
17.0000 mL | Freq: Once | INTRAVENOUS | Status: AC | PRN
  Administered 2015-12-30: 17 mL via INTRAVENOUS

## 2016-01-04 ENCOUNTER — Ambulatory Visit: Payer: No Typology Code available for payment source | Attending: Internal Medicine

## 2016-01-05 ENCOUNTER — Telehealth: Payer: Self-pay

## 2016-01-05 NOTE — Telephone Encounter (Signed)
Discharge letter return to the office- unclaimed. I have re-mailed the letter via [place]. Copy of the envelope has been sent to be scanned.

## 2016-01-05 NOTE — Telephone Encounter (Signed)
Noted  

## 2016-01-07 ENCOUNTER — Ambulatory Visit (INDEPENDENT_AMBULATORY_CARE_PROVIDER_SITE_OTHER): Payer: Self-pay | Admitting: General Surgery

## 2016-01-07 ENCOUNTER — Encounter: Payer: Self-pay | Admitting: General Surgery

## 2016-01-07 VITALS — BP 130/80 | HR 74 | Resp 12 | Ht 62.0 in | Wt 179.0 lb

## 2016-01-07 DIAGNOSIS — K8020 Calculus of gallbladder without cholecystitis without obstruction: Secondary | ICD-10-CM

## 2016-01-07 DIAGNOSIS — R1031 Right lower quadrant pain: Secondary | ICD-10-CM

## 2016-01-07 NOTE — Patient Instructions (Signed)
Laparoscopic Cholecystectomy Laparoscopic cholecystectomy is surgery to remove the gallbladder. The gallbladder is located in the upper right part of the abdomen, behind the liver. It is a storage sac for bile, which is produced in the liver. Bile aids in the digestion and absorption of fats. Cholecystectomy is often done for inflammation of the gallbladder (cholecystitis). This condition is usually caused by a buildup of gallstones (cholelithiasis) in the gallbladder. Gallstones can block the flow of bile, and that can result in inflammation and pain. In severe cases, emergency surgery may be required. If emergency surgery is not required, you will have time to prepare for the procedure. Laparoscopic surgery is an alternative to open surgery. Laparoscopic surgery has a shorter recovery time. Your common bile duct may also need to be examined during the procedure. If stones are found in the common bile duct, they may be removed. LET YOUR HEALTH CARE PROVIDER KNOW ABOUT:  Any allergies you have.  All medicines you are taking, including vitamins, herbs, eye drops, creams, and over-the-counter medicines.  Previous problems you or members of your family have had with the use of anesthetics.  Any blood disorders you have.  Previous surgeries you have had.  Any medical conditions you have. RISKS AND COMPLICATIONS Generally, this is a safe procedure. However, problems may occur, including:  Infection.  Bleeding.  Allergic reactions to medicines.  Damage to other structures or organs.  A stone remaining in the common bile duct.  A bile leak from the cyst duct that is clipped when your gallbladder is removed.  The need to convert to open surgery, which requires a larger incision in the abdomen. This may be necessary if your surgeon thinks that it is not safe to continue with a laparoscopic procedure. BEFORE THE PROCEDURE  Ask your health care provider about:  Changing or stopping your  regular medicines. This is especially important if you are taking diabetes medicines or blood thinners.  Taking medicines such as aspirin and ibuprofen. These medicines can thin your blood. Do not take these medicines before your procedure if your health care provider instructs you not to.  Follow instructions from your health care provider about eating or drinking restrictions.  Let your health care provider know if you develop a cold or an infection before surgery.  Plan to have someone take you home after the procedure.  Ask your health care provider how your surgical site will be marked or identified.  You may be given antibiotic medicine to help prevent infection. PROCEDURE  To reduce your risk of infection:  Your health care team will wash or sanitize their hands.  Your skin will be washed with soap.  An IV tube may be inserted into one of your veins.  You will be given a medicine to make you fall asleep (general anesthetic).  A breathing tube will be placed in your mouth.  The surgeon will make several small cuts (incisions) in your abdomen.  A thin, lighted tube (laparoscope) that has a tiny camera on the end will be inserted through one of the small incisions. The camera on the laparoscope will send a picture to a TV screen (monitor) in the operating room. This will give the surgeon a good view inside your abdomen.  A gas will be pumped into your abdomen. This will expand your abdomen to give the surgeon more room to perform the surgery.  Other tools that are needed for the procedure will be inserted through the other incisions. The gallbladder will   be removed through one of the incisions.  After your gallbladder has been removed, the incisions will be closed with stitches (sutures), staples, or skin glue.  Your incisions may be covered with a bandage (dressing). The procedure may vary among health care providers and hospitals. AFTER THE PROCEDURE  Your blood  pressure, heart rate, breathing rate, and blood oxygen level will be monitored often until the medicines you were given have worn off.  You will be given medicines as needed to control your pain.   This information is not intended to replace advice given to you by your health care provider. Make sure you discuss any questions you have with your health care provider.   Document Released: 06/13/2005 Document Revised: 03/04/2015 Document Reviewed: 01/23/2013 Elsevier Interactive Patient Education 2016 Elsevier Inc.  

## 2016-01-07 NOTE — Progress Notes (Signed)
Patient ID: Christina Fox, female   DOB: [birth year], 35 y.o.   MRN: [id number]  Chief Complaint  Patient presents with  . Other    gallbladder    HPI Christina Fox is a 35 y.o. female here today for a evaluation of her gallbladder. She states it started out as central chest pain. She states she has been having abdominal pain about three months in her right upper and lower quadrants. Patient had a MRCP on 12/30/15. Patient is scheduled for a colonoscopy on 02/15/16.   At the time of her exam about 6 weeks ago plans were for an upper and lower endoscopy. She then canceled with plans to have it completed in [place]. When she contacted that office she reports because she had been seen here they would not see her for an one month. The patient since reestablished herself with the physician who did her original colonoscopy 10 years ago, at which time multiple polyps were reportedly identified, and he recently saw her, obtain laboratory studies and an MRCP.  I personally reviewed the patient's history.   HPI  Past Medical History  Diagnosis Date  . Headache(784.0)     sinus  . GERD (gastroesophageal reflux disease)   . Kidney stones     no current problem  . Anxiety   . Deviated septum 09/2011  . 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  . Complication of anesthesia     states small mouth  . Pelvic inflammatory disease   . Urinary tract infection   . Migraine   . Hypertension     Past Surgical History  Procedure Laterality Date  . 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 Wisdom tooth extraction  2009  . Nasal septoplasty w/ turbinoplasty  10/04/2011    Procedure: NASAL SEPTOPLASTY WITH TURBINATE REDUCTION;  Surgeon: Ascencion Dike, MD;  Location: [place];  Service: ENT;  Laterality: Bilateral;  . Colonoscopy  09-09-15     Family History  Problem Relation Age of Onset  . Kidney cancer Mother   . Hypertension Mother   . Migraines Mother   . Multiple sclerosis Mother   . Hypertension Father   . Liver disease Father     liver transplant; had fatty liver disease  . Spina bifida Brother   . Hypertension Other   . Migraines Maternal Grandmother   . Colon cancer Paternal Grandfather   . Fibromyalgia Mother   . Liver disease Mother     NASH     Social History Social History  Substance Use Topics  . Smoking status: Former Smoker -- 0.25 packs/day    Quit date: 08/23/2009  . Smokeless tobacco: Never Used  . Alcohol Use: No    Allergies  Allergen Reactions  . Bentyl [Dicyclomine Hcl] Other (See Comments)    DIZZINESS  . Haldol [Haloperidol] Other (See Comments)    Made her feel very jittery and agitated  . Meclizine     Chest pain  . Morphine And Related Other (See Comments)    Made her feel like she was losing her mind  . Toradol [Ketorolac Tromethamine] Other (See Comments)    Made her feel very jittery and agitated  . Macrobid [Nitrofurantoin Monohyd Macro] Rash  . Sulfa Antibiotics Hives and Rash    Current Outpatient Prescriptions  Medication Sig Dispense  Refill  . clonazePAM (KLONOPIN) 0.5 MG tablet Take 1 tablet (0.5 mg total) by mouth daily. (Patient taking differently: Take 0.25 mg by mouth 2 (two) times daily. ) 30 tablet 5  . fexofenadine (ALLEGRA) 180 MG tablet Take 1 tablet (180 mg total) by mouth daily. 60 tablet 3  . fluticasone (FLONASE) 50 MCG/ACT nasal spray Place 2 sprays into both nostrils daily. 16 g 6  . levonorgestrel-ethinyl estradiol (AUBRA) 0.1-20 MG-MCG tablet Take 1 tablet by mouth daily. Reported on 12/04/2015    . montelukast (SINGULAIR) 10 MG tablet TAKE 1 TABLET BY MOUTH DAILY 30 tablet 3  . Multiple Vitamins-Calcium (ONE-A-DAY WOMENS PO) Take 1 tablet by mouth every morning.    . pantoprazole (PROTONIX) 40 MG tablet Take 1 tablet (40 mg total) by mouth daily.  30 tablet 3  . sucralfate (CARAFATE) 1 g tablet Take 1 tablet by mouth daily as needed (stomach).   0  . [DISCONTINUED] escitalopram (LEXAPRO) 10 MG tablet Take 10 mg by mouth daily.      . [DISCONTINUED] ipratropium (ATROVENT) 0.06 % nasal spray Place 2 sprays into both nostrils 4 (four) times daily. (Patient not taking: Reported on 05/14/2015) 15 mL 1   No current facility-administered medications for this visit.    Review of Systems Review of Systems  Constitutional: Negative.   Respiratory: Negative.   Cardiovascular: Negative.   Gastrointestinal: Positive for nausea and abdominal pain.    Blood pressure 130/80, pulse 74, resp. rate 12, height 5\' 2"  (1.575 m), weight 179 lb (81.194 kg), last menstrual period 11/27/2015.  Physical Exam Physical Exam  Constitutional: She is oriented to person, place, and time. She appears well-developed and well-nourished.  Eyes: Conjunctivae are normal. No scleral icterus.  Neck: Neck supple.  Cardiovascular: Normal rate, regular rhythm and normal heart sounds.   Pulmonary/Chest: Effort normal and breath sounds normal.  Abdominal: Soft. Bowel sounds are normal. There is no tenderness.  Lymphadenopathy:    She has no cervical adenopathy.  Neurological: She is alert and oriented to person, place, and time.  Skin: Skin is warm and dry.    Data Reviewed December 30, 2015 MRCP IMPRESSION: 1. Tiny gallstone. No gallbladder inflammation. 2. Normal biliary tree. 3. Normal liver parenchyma.  Located serum transaminases. Normal bilirubin and alkaline phosphatase. Assessment    Abdominal pain, questionable biliary source.     Plan    I'm not convinced that the patient's symptoms are related to the biliary tree, by 2 gastroenterologists have deferred further evaluation of her change in bowel habits and diarrhea prior to cholecystectomy.  The patient has been carefully cons that there is no guarantee that gallbladder removal will relieve her  symptoms.    Laparoscopic Cholecystectomy with Intraoperative Cholangiogram. The procedure, including it's potential risks and complications (including but not limited to infection, bleeding, injury to intra-abdominal organs or bile ducts, bile leak, poor cosmetic result, sepsis and death) were discussed with the patient in detail. Non-operative options, including their inherent risks (acute calculous cholecystitis with possible choledocholithiasis or gallstone pancreatitis, with the risk of ascending cholangitis, sepsis, and death) were discussed as well. The patient expressed and understanding of what we discussed and wishes to proceed with laparoscopic cholecystectomy. The patient further understands that if it is technically not possible, or it is unsafe to proceed laparoscopically, that I will convert to an open cholecystectomy.  PCP:  Janne Napoleon This information has been scribed by Gaspar Cola CMA.   Robert Bellow 01/07/2016, 10:24 PM

## 2016-01-07 NOTE — H&P (Signed)
HPI  Christina Fox is a 35 y.o. female here today for a evaluation of her gallbladder. She states it started out as central chest pain. She states she has been having abdominal pain about three months in her right upper and lower quadrants. Patient had a MRCP on 12/30/15. Patient is scheduled for a colonoscopy on 02/15/16.  At the time of her exam about 6 weeks ago plans were for an upper and lower endoscopy. She then canceled with plans to have it completed in [place]. When she contacted that office she reports because she had been seen here they would not see her for an one month. The patient since reestablished herself with the physician who did her original colonoscopy 10 years ago, at which time multiple polyps were reportedly identified, and he recently saw her, obtain laboratory studies and an MRCP.  I personally reviewed the patient's history.  HPI  Past Medical History   Diagnosis  Date   .  Headache(784.0)      sinus   .  GERD (gastroesophageal reflux disease)    .  Kidney stones      no current problem   .  Anxiety    .  Deviated septum  09/2011   .  Nasal turbinate hypertrophy  09/2011     bilat.   .  Complication of anesthesia      states small mouth   .  Pelvic inflammatory disease    .  Urinary tract infection    .  Migraine    .  Hypertension     Past Surgical History   Procedure  Laterality  Date   .  Colonoscopy   2007     [facility]: pedunculated 58mm polyp in distal sigmoid, sessile 64mm polyp at splenic flexure, larger polyp tubulovillous adenoma and smaller polyp tubular adenoma   .  Wisdom tooth extraction   2009   .  Nasal septoplasty w/ turbinoplasty   10/04/2011     Procedure: NASAL SEPTOPLASTY WITH TURBINATE REDUCTION; Surgeon: Ascencion Dike, MD; Location: [facility]; Service: ENT; Laterality: Bilateral;   .  Colonoscopy   09-09-15    Family History   Problem  Relation  Age of Onset   .  Kidney cancer  Mother    .  Hypertension  Mother    .   Migraines  Mother    .  Multiple sclerosis  Mother    .  Hypertension  Father    .  Liver disease  Father      liver transplant; had fatty liver disease   .  Spina bifida  Brother    .  Hypertension  Other    .  Migraines  Maternal Grandmother    .  Colon cancer  Paternal Grandfather    .  Fibromyalgia  Mother    .  Liver disease  Mother      NASH    Social History  Social History   Substance Use Topics   .  Smoking status:  Former Smoker -- 0.25 packs/day     Quit date:  08/23/2009   .  Smokeless tobacco:  Never Used   .  Alcohol Use:  No    Allergies   Allergen  Reactions   .  Bentyl [Dicyclomine Hcl]  Other (See Comments)     DIZZINESS   .  Haldol [Haloperidol]  Other (See Comments)     Made her feel very jittery and agitated   .  Meclizine      Chest pain   .  Morphine And Related  Other (See Comments)     Made her feel like she was losing her mind   .  Toradol [Ketorolac Tromethamine]  Other (See Comments)     Made her feel very jittery and agitated   .  Macrobid [Nitrofurantoin Monohyd Macro]  Rash   .  Sulfa Antibiotics  Hives and Rash    Current Outpatient Prescriptions   Medication  Sig  Dispense  Refill   .  clonazePAM (KLONOPIN) 0.5 MG tablet  Take 1 tablet (0.5 mg total) by mouth daily. (Patient taking differently: Take 0.25 mg by mouth 2 (two) times daily. )  30 tablet  5   .  fexofenadine (ALLEGRA) 180 MG tablet  Take 1 tablet (180 mg total) by mouth daily.  60 tablet  3   .  fluticasone (FLONASE) 50 MCG/ACT nasal spray  Place 2 sprays into both nostrils daily.  16 g  6   .  levonorgestrel-ethinyl estradiol (AUBRA) 0.1-20 MG-MCG tablet  Take 1 tablet by mouth daily. Reported on 12/04/2015     .  montelukast (SINGULAIR) 10 MG tablet  TAKE 1 TABLET BY MOUTH DAILY  30 tablet  3   .  Multiple Vitamins-Calcium (ONE-A-DAY WOMENS PO)  Take 1 tablet by mouth every morning.     .  pantoprazole (PROTONIX) 40 MG tablet  Take 1 tablet (40 mg total) by mouth daily.  30  tablet  3   .  sucralfate (CARAFATE) 1 g tablet  Take 1 tablet by mouth daily as needed (stomach).   0   .  [DISCONTINUED] escitalopram (LEXAPRO) 10 MG tablet  Take 10 mg by mouth daily.     .  [DISCONTINUED] ipratropium (ATROVENT) 0.06 % nasal spray  Place 2 sprays into both nostrils 4 (four) times daily. (Patient not taking: Reported on 05/14/2015)  15 mL  1    No current facility-administered medications for this visit.    Review of Systems  Review of Systems  Constitutional: Negative.  Respiratory: Negative.  Cardiovascular: Negative.  Gastrointestinal: Positive for nausea and abdominal pain.   Blood pressure 130/80, pulse 74, resp. rate 12, height 5\' 2"  (1.575 m), weight 179 lb (81.194 kg), last menstrual period 11/27/2015.  Physical Exam  Physical Exam  Constitutional: She is oriented to person, place, and time. She appears well-developed and well-nourished.  Eyes: Conjunctivae are normal. No scleral icterus.  Neck: Neck supple.  Cardiovascular: Normal rate, regular rhythm and normal heart sounds.  Pulmonary/Chest: Effort normal and breath sounds normal.  Abdominal: Soft. Bowel sounds are normal. There is no tenderness.  Lymphadenopathy:  She has no cervical adenopathy.  Neurological: She is alert and oriented to person, place, and time.  Skin: Skin is warm and dry.   Data Reviewed  December 30, 2015 MRCP IMPRESSION:  1. Tiny gallstone. No gallbladder inflammation.  2. Normal biliary tree.  3. Normal liver parenchyma.  Located serum transaminases. Normal bilirubin and alkaline phosphatase.  Assessment   Abdominal pain, questionable biliary source.   Plan   I'm not convinced that the patient's symptoms are related to the biliary tree, by 2 gastroenterologists have deferred further evaluation of her change in bowel habits and diarrhea prior to cholecystectomy.  The patient has been carefully cons that there is no guarantee that gallbladder removal will relieve her symptoms.   Laparoscopic Cholecystectomy with Intraoperative Cholangiogram. The procedure, including it's potential risks and complications (including  but not limited to infection, bleeding, injury to intra-abdominal organs or bile ducts, bile leak, poor cosmetic result, sepsis and death) were discussed with the patient in detail. Non-operative options, including their inherent risks (acute calculous cholecystitis with possible choledocholithiasis or gallstone pancreatitis, with the risk of ascending cholangitis, sepsis, and death) were discussed as well. The patient expressed and understanding of what we discussed and wishes to proceed with laparoscopic cholecystectomy. The patient further understands that if it is technically not possible, or it is unsafe to proceed laparoscopically, that I will convert to an open cholecystectomy.  PCP: Janne Napoleon  This information has been scribed by Gaspar Cola CMA.  Robert Bellow  01/07/2016, 10:24 PM

## 2016-01-11 ENCOUNTER — Encounter: Payer: Self-pay | Admitting: *Deleted

## 2016-01-11 ENCOUNTER — Other Ambulatory Visit: Payer: No Typology Code available for payment source

## 2016-01-11 NOTE — Pre-Procedure Instructions (Signed)
Christina Fox  [facility] Myocardial Perfusion Imaging with Exercise Stress 1 Day Rest/Stress Protocol  Order# [id number]   Reading physician: Skeet Latch, MD  Ordering physician: Will Meredith Leeds, MD  Study date: 12/14/15      Patient Information    Name MRN Description   Christina Fox [id number] 35 year old Female    Result Notes    Notes Recorded by Stanton Kidney, RN on 12/15/2015 at 4:53 PM Reviewed results with patient who verbalized understanding.  ------  Notes Recorded by Will Meredith Leeds, MD on 12/15/2015 at 10:09 AM Low risk study. Likely not the cause of chest pain.         Vitals    Height Weight BMI (Calculated)   5' 3.35" (1.609 m) 180 lb 9.6 oz (81.92 kg) 31.6    Study Highlights     Nuclear stress EF: 59%.  T wave inversion was noted during stress in the II, III, aVF, V4, V5 and V6 leads.  Horizontal ST segment depression ST segment depression of 2 mm was noted during stress in the V4, V5 and V6 leads.  The study is normal.  This is a low risk study.  The left ventricular ejection fraction is normal (55-65%).     Nuclear History and Indications    History and Indications Indication for Stress Test: Diagnosis of coronary disease History: No history of CAD Cardiac Risk Factors: Hypertension  Symptoms: Chest Pain    Stress Findings    ECG Baseline ECG exhibits normal sinus rhythm and with PVCs..   Stress Findings The patient exercised following the Bruce protocol.  The patient reported no symptoms during the stress test. The patient experienced no angina during the stress test.   The test was stopped because the patient complained of fatigue.   Blood pressure and heart rate demonstrated a normal response to exercise. Overall, the patient's exercise capacity was normal.   Recovery time: 5 minutes.  Duke Treadmill Score: low risk The patient's response to exercise was adequate for diagnosis.   Response to  Stress Horizontal ST segment depression ST segment depression of 2 mm was noted during stress in the V4, V5 and V6 leads.  T wave inversion was noted during stress in the II, III, aVF, V4, V5 and V6 leads. Arrhythmias during stress: none.  Arrhythmias during recovery: none.  There were no significant arrhythmias noted during the test.  ECG was interpretable and conclusive.    Stress Measurements    Baseline Vitals  Rest HR 60 bpm    Rest BP 148/81 mmHg    Exercise Time  Exercise duration (min) 7 min    Exercise duration (sec) 0 sec    Peak Stress Vitals  Peak HR 171 bpm    Peak BP 161/67 mmHg    Exercise Data  MPHR 185 bpm    Percent HR 92 %    Estimated workload 8.5 METS         Nuclear Stress Findings    Isotope administration Rest isotope was administered with an IV injection of 10.1 mCi technetium tetrofosmin. Rest SPECT images were obtained approximately 45 minutes post tracer injection. Stress isotope was administered with an IV injection of 31.9 mCi technetium tetrofosmin at peak exercise Stress SPECT images were obtained approximately 30 minutes post tracer injection.   Nuclear Study Quality Overall image quality is excellent.Image quality affected due to significant extracardiac activity.   Rest Perfusion Rest perfusion normal.   Stress Perfusion Stress perfusion  normal.   Overall Study Impression Myocardial perfusion is normal. The study is normal. This is a low risk study. Overall left ventricular systolic function was normal. LV cavity size is normal. Nuclear stress EF: 59%. The left ventricular ejection fraction is normal (55-65%). There is no prior study for comparison.  From: ACCF/SCAI/STS/AATS/AHA/ASNC/HFSA/SCCT 2012 Appropriate Use Criteria for Coronary Revascularization Focused Update    Wall Scoring    Score Index: 1.000 Percent Normal: 100.0%          The left ventricular wall motion is normal.            Signed    Electronically  signed by Skeet Latch, MD on 12/14/15 at 1445 EDT     Report approved and finalized on 12/14/2015 1445    Imaging    Imaging Information    Order-Level Documents - 12/14/2015:      Scan on 12/15/2015 10:43 AM by Provider Default, MDScan on 12/15/2015 10:43 AM by Provider Default, MD     Scan on 12/14/2015 1:02 PM by Ginger Carne Kubak : Protocol and data sheetScan on 12/14/2015 1:02 PM by Gardiner Coins : Protocol and data sheet     Scan on 12/14/2015 1:03 PM by Ginger Carne Kubak : MPI imagesScan on 12/14/2015 1:03 PM by Ginger Carne Kubak : MPI images     Scan on 12/14/2015 1:03 PM by Ginger Carne Kubak : Consent formScan on 12/14/2015 1:03 PM by Gardiner Coins : Consent form    Encounter-Level Documents - 12/14/2015:      Scan on 12/24/2015 12:11 PM by Provider Default, [place] on 12/24/2015 12:11 PM by Provider Default, MD     Electronic signature on 12/14/2015 10:04 AM     Electronic signature on 12/14/2015 10:03 AM    Exam Information    Status Exam Begun   Exam Ended     Final [99] 12/14/2015 10:05 AM 12/14/2015 12:40 PM    External Result Report    External Result Report    Order   Myocardial Perfusion Imaging [[id number]] (Order [id number])       Procedure Abnormality Status    Myocardial Perfusion Imaging      Order Providers     Authorizing Encounter Billing    Will Meredith Leeds [facility] NM2/TREAD [place]      Original Order     Ordered On Ordered By      12/02/2015 4:28 PM Stanton Kidney, RN             Associated Diagnoses       ICD-9-CM ICD-10-CM    Chest pain, unspecified chest pain type    786.50 R07.9      Order Questions     Question Answer Comment    Where should this test be performed [facility]patient [facility])     Type of stress Exercise     Patient weight in lbs 181       Appointments for this Order     12/14/2015 10:00  AM - 15 min [facility] NM2/TREAD (Resource) [facility] [address]      Additional Information     Associated Librarian, academic and Order Details    Collection Information

## 2016-01-11 NOTE — Patient Instructions (Signed)
  Your procedure is scheduled on: 01-15-16 Report to Same Day Surgery 2nd floor medical mall To find out your arrival time please call [phone] between 1PM - 3PM on -01-14-16  Remember: Instructions that are not followed completely may result in serious medical risk, up to and including death, or upon the discretion of your surgeon and anesthesiologist your surgery may need to be rescheduled.    _x___ 1. Do not eat food or drink liquids after midnight. No gum chewing or hard candies.     __x__ 2. No Alcohol for 24 hours before or after surgery.   __x__3. No Smoking for 24 prior to surgery.   ____  4. Bring all medications with you on the day of surgery if instructed.    __x__ 5. Notify your doctor if there is any change in your medical condition     (cold, fever, infections).     Do not wear jewelry, make-up, hairpins, clips or nail polish.  Do not wear lotions, powders, or perfumes. You may wear deodorant.  Do not shave 48 hours prior to surgery. Men may shave face and neck.  Do not bring valuables to the hospital.    [facility] is not responsible for any belongings or valuables.               Contacts, dentures or bridgework may not be worn into surgery.  Leave your suitcase in the car. After surgery it may be brought to your room.  For patients admitted to the hospital, discharge time is determined by your treatment team.   Patients discharged the day of surgery will not be allowed to drive home.    Please read over the following fact sheets that you were given:   [facility] Preparing for Surgery and or MRSA Information   _x___ Take these medicines the morning of surgery with A SIP OF WATER:    1. singulair  2. klonopin  3. protonix  4. Take an extra protonix on Thursday night  5.  6.  ____ Fleet Enema (as directed)   ____ Use CHG Soap or sage wipes as directed on instruction sheet   ____ Use inhalers on the day of surgery and bring to hospital day of  surgery  ____ Stop metformin 2 days prior to surgery    ____ Take 1/2 of usual insulin dose the night before surgery and none on the morning of surgery.   ____ Stop aspirin or coumadin, or plavix  ___ Stop Anti-inflammatories such as Advil, Aleve, Ibuprofen, Motrin, Naproxen,          Naprosyn, Goodies powders or aspirin products. Ok to take Tylenol.   ____ Stop supplements until after surgery.    ____ Bring C-Pap to the hospital.

## 2016-01-12 MED FILL — MONTELUKAST SOD 10 MG TAB: 10 | 30 days supply | Qty: 30 | Fill #4

## 2016-01-13 ENCOUNTER — Encounter: Payer: Self-pay | Admitting: Obstetrics & Gynecology

## 2016-01-13 ENCOUNTER — Ambulatory Visit (INDEPENDENT_AMBULATORY_CARE_PROVIDER_SITE_OTHER): Payer: Self-pay | Admitting: Obstetrics & Gynecology

## 2016-01-13 VITALS — BP 131/90 | HR 74 | Wt 179.0 lb

## 2016-01-13 DIAGNOSIS — N888 Other specified noninflammatory disorders of cervix uteri: Secondary | ICD-10-CM

## 2016-01-13 NOTE — Progress Notes (Signed)
   Subjective:    Patient ID: Christina Fox, female    DOB: 1981/04/02, 35 y.o.   MRN: [id number]  HPI 35 yo W lady referred here as her primary care doctor saw something that she believed to be abnormal on her cervix during her otherwise normal pap smear recently.   Review of Systems She is having her gallbladder removed this Friday.    Objective:   Physical Exam [id number] Breathing, conversing, and ambulating normally Cervix has several Nabothian cysts, appears normal       Assessment & Plan:  Nabothian cysts- reassurance given

## 2016-01-14 MED FILL — ONDANSETRON ODT 4 MG TABLET: 4 | 10 days supply | Qty: 30 | Fill #0

## 2016-01-15 ENCOUNTER — Ambulatory Visit
Admission: RE | Admit: 2016-01-15 | Discharge: 2016-01-15 | Disposition: A | Discharge status: Home / Self Care | Payer: Medicaid Other | Source: Ambulatory Visit | Attending: General Surgery | Admitting: General Surgery

## 2016-01-15 ENCOUNTER — Encounter
Admission: RE | Disposition: A | Discharge status: Home / Self Care | Payer: Self-pay | Source: Ambulatory Visit | Attending: General Surgery

## 2016-01-15 ENCOUNTER — Ambulatory Visit: Payer: Medicaid Other

## 2016-01-15 ENCOUNTER — Ambulatory Visit: Payer: Medicaid Other | Admitting: Anesthesiology

## 2016-01-15 ENCOUNTER — Other Ambulatory Visit: Payer: Self-pay | Admitting: Internal Medicine

## 2016-01-15 DIAGNOSIS — Z79899 Other long term (current) drug therapy: Secondary | ICD-10-CM | POA: Diagnosis not present

## 2016-01-15 DIAGNOSIS — Z886 Allergy status to analgesic agent status: Secondary | ICD-10-CM | POA: Insufficient documentation

## 2016-01-15 DIAGNOSIS — Z86010 Personal history of colonic polyps: Secondary | ICD-10-CM | POA: Insufficient documentation

## 2016-01-15 DIAGNOSIS — K219 Gastro-esophageal reflux disease without esophagitis: Secondary | ICD-10-CM | POA: Insufficient documentation

## 2016-01-15 DIAGNOSIS — I10 Essential (primary) hypertension: Secondary | ICD-10-CM | POA: Diagnosis not present

## 2016-01-15 DIAGNOSIS — Z882 Allergy status to sulfonamides status: Secondary | ICD-10-CM | POA: Insufficient documentation

## 2016-01-15 DIAGNOSIS — F419 Anxiety disorder, unspecified: Secondary | ICD-10-CM | POA: Diagnosis not present

## 2016-01-15 DIAGNOSIS — K8010 Calculus of gallbladder with chronic cholecystitis without obstruction: Secondary | ICD-10-CM

## 2016-01-15 DIAGNOSIS — Z87442 Personal history of urinary calculi: Secondary | ICD-10-CM | POA: Diagnosis not present

## 2016-01-15 DIAGNOSIS — Z888 Allergy status to other drugs, medicaments and biological substances status: Secondary | ICD-10-CM | POA: Insufficient documentation

## 2016-01-15 DIAGNOSIS — Z419 Encounter for procedure for purposes other than remedying health state, unspecified: Secondary | ICD-10-CM

## 2016-01-15 DIAGNOSIS — Z885 Allergy status to narcotic agent status: Secondary | ICD-10-CM | POA: Diagnosis not present

## 2016-01-15 DIAGNOSIS — Z87891 Personal history of nicotine dependence: Secondary | ICD-10-CM | POA: Insufficient documentation

## 2016-01-15 HISTORY — PX: CHOLECYSTECTOMY: SHX55

## 2016-01-15 LAB — POCT PREGNANCY, URINE: PREG TEST UR: NEGATIVE

## 2016-01-15 SURGERY — LAPAROSCOPIC CHOLECYSTECTOMY WITH INTRAOPERATIVE CHOLANGIOGRAM
Anesthesia: General | Wound class: Clean Contaminated

## 2016-01-15 MED ORDER — MIDAZOLAM HCL 2 MG/2ML IJ SOLN
INTRAMUSCULAR | Status: DC | PRN
  Administered 2016-01-15: 2 mg via INTRAVENOUS

## 2016-01-15 MED ORDER — SODIUM CHLORIDE 0.9 % IV SOLN
INTRAVENOUS | Status: DC | PRN
  Administered 2016-01-15: 10 mL

## 2016-01-15 MED ORDER — FENTANYL CITRATE (PF) 100 MCG/2ML IJ SOLN
INTRAMUSCULAR | Status: AC
  Administered 2016-01-15: 25 ug via INTRAVENOUS
  Filled 2016-01-15: qty 2

## 2016-01-15 MED ORDER — FENTANYL CITRATE (PF) 100 MCG/2ML IJ SOLN
25.0000 ug | INTRAMUSCULAR | Status: AC | PRN
  Administered 2016-01-15 (×6): 25 ug via INTRAVENOUS

## 2016-01-15 MED ORDER — OXYCODONE HCL 5 MG/5ML PO SOLN: 5.0000 mg | Freq: Once | ORAL | Status: AC | PRN

## 2016-01-15 MED ORDER — NEOSTIGMINE METHYLSULFATE 10 MG/10ML IV SOLN
INTRAVENOUS | Status: DC | PRN
  Administered 2016-01-15: 4 mg via INTRAVENOUS

## 2016-01-15 MED ORDER — GLYCOPYRROLATE 0.2 MG/ML IJ SOLN
INTRAMUSCULAR | Status: DC | PRN
  Administered 2016-01-15: 0.6 mg via INTRAVENOUS

## 2016-01-15 MED ORDER — ROCURONIUM BROMIDE 100 MG/10ML IV SOLN
INTRAVENOUS | Status: DC | PRN
  Administered 2016-01-15: 10 mg via INTRAVENOUS
  Administered 2016-01-15: 30 mg via INTRAVENOUS

## 2016-01-15 MED ORDER — OXYCODONE HCL 5 MG PO TABS
5.0000 mg | ORAL_TABLET | Freq: Once | ORAL | Status: DC
  Filled 2016-01-15: qty 1

## 2016-01-15 MED ORDER — OXYCODONE HCL 5 MG PO TABS
ORAL_TABLET | ORAL | Status: AC
  Administered 2016-01-15: 5 mg
  Filled 2016-01-15: qty 1

## 2016-01-15 MED ORDER — OXYCODONE-ACETAMINOPHEN 5-325 MG PO TABS: 1.0000 | ORAL_TABLET | ORAL | Status: DC | PRN

## 2016-01-15 MED ORDER — ACETAMINOPHEN 10 MG/ML IV SOLN
INTRAVENOUS | Status: DC | PRN
  Administered 2016-01-15: 1000 mg via INTRAVENOUS

## 2016-01-15 MED ORDER — DIPHENHYDRAMINE HCL 50 MG/ML IJ SOLN
INTRAMUSCULAR | Status: DC | PRN
  Administered 2016-01-15: 6.25 mg via INTRAVENOUS

## 2016-01-15 MED ORDER — CEFAZOLIN SODIUM-DEXTROSE 2-3 GM-% IV SOLR
INTRAVENOUS | Status: DC | PRN
  Administered 2016-01-15: 2 g via INTRAVENOUS

## 2016-01-15 MED ORDER — LIDOCAINE HCL (CARDIAC) 20 MG/ML IV SOLN
INTRAVENOUS | Status: DC | PRN
  Administered 2016-01-15: 100 mg via INTRAVENOUS

## 2016-01-15 MED ORDER — ACETAMINOPHEN 10 MG/ML IV SOLN
INTRAVENOUS | Status: AC
  Filled 2016-01-15: qty 100

## 2016-01-15 MED ORDER — OXYCODONE HCL 5 MG PO TABS
ORAL_TABLET | ORAL | Status: AC
  Filled 2016-01-15: qty 1

## 2016-01-15 MED ORDER — PROPOFOL 10 MG/ML IV BOLUS
INTRAVENOUS | Status: DC | PRN
  Administered 2016-01-15: 160 mg via INTRAVENOUS

## 2016-01-15 MED ORDER — ONDANSETRON HCL 4 MG/2ML IJ SOLN
INTRAMUSCULAR | Status: DC | PRN
  Administered 2016-01-15: 4 mg via INTRAVENOUS

## 2016-01-15 MED ORDER — DEXAMETHASONE SODIUM PHOSPHATE 10 MG/ML IJ SOLN
INTRAMUSCULAR | Status: DC | PRN
  Administered 2016-01-15: 5 mg via INTRAVENOUS

## 2016-01-15 MED ORDER — LACTATED RINGERS IV SOLN
INTRAVENOUS | Status: DC
  Administered 2016-01-15: 10:00:00 via INTRAVENOUS

## 2016-01-15 MED ORDER — OXYCODONE HCL 5 MG PO TABS
5.0000 mg | ORAL_TABLET | Freq: Once | ORAL | Status: AC | PRN
  Administered 2016-01-15: 5 mg via ORAL

## 2016-01-15 MED ORDER — LACTATED RINGERS IV SOLN
INTRAVENOUS | Status: DC | PRN
  Administered 2016-01-15: 11:00:00 via INTRAVENOUS

## 2016-01-15 MED ORDER — FENTANYL CITRATE (PF) 100 MCG/2ML IJ SOLN
INTRAMUSCULAR | Status: DC | PRN
  Administered 2016-01-15: 100 ug via INTRAVENOUS
  Administered 2016-01-15: 50 ug via INTRAVENOUS

## 2016-01-15 MED ORDER — SODIUM CHLORIDE 0.9 % IJ SOLN
INTRAMUSCULAR | Status: AC
  Filled 2016-01-15: qty 50

## 2016-01-15 SURGICAL SUPPLY — 39 items
ANCHOR TIS RET SYS 235ML (MISCELLANEOUS) ×3 IMPLANT
APPLICATOR SURGIFLO (MISCELLANEOUS) IMPLANT
APPLIER CLIP LOGIC TI 5 (MISCELLANEOUS) ×3 IMPLANT
BLADE SURG 11 STRL SS SAFETY (MISCELLANEOUS) ×3 IMPLANT
CANISTER SUCT 1200ML W/VALVE (MISCELLANEOUS) ×3 IMPLANT
CANNULA DILATOR 10 W/SLV (CANNULA) ×2 IMPLANT
CANNULA DILATOR 10MM W/SLV (CANNULA) ×1
CATH CHOLANG 76X19 KUMAR (CATHETERS) ×3 IMPLANT
CHLORAPREP W/TINT 26ML (MISCELLANEOUS) ×3 IMPLANT
CLOSURE WOUND 1/2 X4 (GAUZE/BANDAGES/DRESSINGS) ×1
DEFOGGER SCOPE WARMER CLEARIFY (MISCELLANEOUS) ×3 IMPLANT
DRAPE C-ARM XRAY 36X54 (DRAPES) ×3 IMPLANT
DRAPE INCISE IOBAN 66X45 STRL (DRAPES) ×3 IMPLANT
DRESSING TELFA 4X3 1S ST N-ADH (GAUZE/BANDAGES/DRESSINGS) ×3 IMPLANT
DRSG TEGADERM 2-3/8X2-3/4 SM (GAUZE/BANDAGES/DRESSINGS) ×12 IMPLANT
ELECT REM PT RETURN 9FT ADLT (ELECTROSURGICAL) ×3
ELECTRODE REM PT RTRN 9FT ADLT (ELECTROSURGICAL) ×1 IMPLANT
GLOVE BIO SURGEON STRL SZ7 (GLOVE) ×12 IMPLANT
GOWN STRL REUS W/ TWL LRG LVL3 (GOWN DISPOSABLE) ×3 IMPLANT
GOWN STRL REUS W/TWL LRG LVL3 (GOWN DISPOSABLE) ×6
GRASPER SUT TROCAR 14GX15 (MISCELLANEOUS) ×3 IMPLANT
HEMOSTAT SURGICEL 2X3 (HEMOSTASIS) IMPLANT
IRRIGATION STRYKERFLOW (MISCELLANEOUS) ×1 IMPLANT
IRRIGATOR STRYKERFLOW (MISCELLANEOUS) ×3
IV LACTATED RINGERS 1000ML (IV SOLUTION) ×3 IMPLANT
KIT RM TURNOVER STRD PROC AR (KITS) ×3 IMPLANT
LABEL OR SOLS (LABEL) ×3 IMPLANT
NDL INSUFF ACCESS 14 VERSASTEP (NEEDLE) ×3 IMPLANT
PACK LAP CHOLECYSTECTOMY (MISCELLANEOUS) ×3 IMPLANT
SCISSORS METZENBAUM CVD 33 (INSTRUMENTS) ×3 IMPLANT
SLEEVE ENDOPATH XCEL 5M (ENDOMECHANICALS) ×6 IMPLANT
SPOGE SURGIFLO 8M (HEMOSTASIS)
SPONGE SURGIFLO 8M (HEMOSTASIS) IMPLANT
STRIP CLOSURE SKIN 1/2X4 (GAUZE/BANDAGES/DRESSINGS) ×2 IMPLANT
SUT VIC AB 0 SH 27 (SUTURE) ×3 IMPLANT
SUT VIC AB 4-0 FS2 27 (SUTURE) ×6 IMPLANT
SWABSTK COMLB BENZOIN TINCTURE (MISCELLANEOUS) ×3 IMPLANT
TROCAR XCEL NON-BLD 5MMX100MML (ENDOMECHANICALS) ×3 IMPLANT
TUBING INSUFFLATOR HI FLOW (MISCELLANEOUS) ×3 IMPLANT

## 2016-01-15 NOTE — Anesthesia Preprocedure Evaluation (Signed)
Anesthesia Evaluation  Patient identified by MRN, date of birth, ID band Patient awake    Reviewed: Allergy & Precautions, H&P , NPO status , Patient's Chart, lab work & pertinent test results  History of Anesthesia Complications Negative for: history of anesthetic complications  Airway Mallampati: III  TM Distance: >3 FB Neck ROM: full    Dental  (+) Poor Dentition   Pulmonary neg shortness of breath, former smoker,    Pulmonary exam normal breath sounds clear to auscultation       Cardiovascular Exercise Tolerance: Good hypertension, (-) angina(-) Past MI and (-) DOE Normal cardiovascular exam Rhythm:regular Rate:Normal     Neuro/Psych  Headaches, PSYCHIATRIC DISORDERS Anxiety    GI/Hepatic negative GI ROS, Neg liver ROS, GERD  Controlled,  Endo/Other  negative endocrine ROS  Renal/GU Renal disease  negative genitourinary   Musculoskeletal   Abdominal   Peds  Hematology negative hematology ROS (+)   Anesthesia Other Findings Past Medical History:   Headache(784.0)                                                Comment:sinus   GERD (gastroesophageal reflux disease)                       Kidney stones                                                  Comment:no current problem   Anxiety                                                      Deviated septum                                 09/2011       Nasal turbinate hypertrophy                     09/2011         Comment:bilat.   Complication of anesthesia                                     Comment:states small mouth   Pelvic inflammatory disease                                  Urinary tract infection                                      Migraine  Hypertension                                                   Comment:no meds  Past Surgical History:   COLONOSCOPY                                      2007            Comment:[facility]: pedunculated 5mm polyp in distal               sigmoid, sessile 29mm polyp at splenic flexure,               larger polyp tubulovillous adenoma and smaller               polyp tubular adenoma    WISDOM TOOTH EXTRACTION                          2009         NASAL SEPTOPLASTY W/ TURBINOPLASTY               10/04/2011       Comment:Procedure: NASAL SEPTOPLASTY WITH TURBINATE               REDUCTION;  Surgeon: Ascencion Dike, MD;  Location:              [facility];  Service: ENT;                Laterality: Bilateral;   COLONOSCOPY                                      09-09-15     BMI    Body Mass Index   32.36 kg/m 2      Reproductive/Obstetrics negative OB ROS                             Anesthesia Physical Anesthesia Plan  ASA: III  Anesthesia Plan: General ETT   Post-op Pain Management:    Induction:   Airway Management Planned:   Additional Equipment:   Intra-op Plan:   Post-operative Plan:   Informed Consent: I have reviewed the patients History and Physical, chart, labs and discussed the procedure including the risks, benefits and alternatives for the proposed anesthesia with the patient or authorized representative who has indicated his/her understanding and acceptance.   Dental Advisory Given  Plan Discussed with: Anesthesiologist, CRNA and Surgeon  Anesthesia Plan Comments:         Anesthesia Quick Evaluation

## 2016-01-15 NOTE — Anesthesia Procedure Notes (Addendum)
Procedure Name: Intubation Date/Time: 01/15/2016 11:09 AM Performed by: Timoteo Expose Pre-anesthesia Checklist: Patient identified, Emergency Drugs available, Suction available, Patient being monitored and Timeout performed Patient Re-evaluated:Patient Re-evaluated prior to inductionOxygen Delivery Method: Circle system utilized Preoxygenation: Pre-oxygenation with 100% oxygen Intubation Type: IV induction Ventilation: Mask ventilation without difficulty Laryngoscope Size: Miller and 2 Grade View: Grade II Tube type: Oral Tube size: 7.0 mm Number of attempts: 1 Airway Equipment and Method: Patient positioned with wedge pillow Placement Confirmation: ETT inserted through vocal cords under direct vision,  positive ETCO2,  CO2 detector and breath sounds checked- equal and bilateral Secured at: 21 cm Tube secured with: Tape Dental Injury: Teeth and Oropharynx as per pre-operative assessment

## 2016-01-15 NOTE — OR Nursing (Signed)
Pain is somewhat better.  Wants to try to get dress.

## 2016-01-15 NOTE — Transfer of Care (Signed)
Immediate Anesthesia Transfer of Care Note  Patient: Christina Fox  Procedure(s) Performed: Procedure(s): LAPAROSCOPIC CHOLECYSTECTOMY WITH INTRAOPERATIVE CHOLANGIOGRAM (N/A)  Patient Location: PACU  Anesthesia Type:General  Level of Consciousness: awake  Airway & Oxygen Therapy: Patient Spontanous Breathing  Post-op Assessment: Report given to RN  Post vital signs: stable  Last Vitals:  Filed Vitals:   01/15/16 0953 01/15/16 1215  BP: 148/95 143/78  Pulse: 81 88  Temp: 36.7 C 36.7 C  Resp: 16 13    Last Pain: There were no vitals filed for this visit.       Complications: No apparent anesthesia complications

## 2016-01-15 NOTE — Discharge Instructions (Signed)
AMBULATORY SURGERY  °DISCHARGE INSTRUCTIONS ° ° °1) The drugs that you were given will stay in your system until tomorrow so for the next 24 hours you should not: ° °A) Drive an automobile °B) Make any legal decisions °C) Drink any alcoholic beverage ° ° °2) You may resume regular meals tomorrow.  Today it is better to start with liquids and gradually work up to solid foods. ° °You may eat anything you prefer, but it is better to start with liquids, then soup and crackers, and gradually work up to solid foods. ° ° °3) Please notify your doctor immediately if you have any unusual bleeding, trouble breathing, redness and pain at the surgery site, drainage, fever, or pain not relieved by medication. ° ° ° °4) Additional Instructions: ° ° ° ° ° ° ° °Please contact your physician with any problems or Same Day Surgery at [phone], Monday through Friday 6 am to 4 pm, or [place] [facility] Main number at [phone]. °

## 2016-01-15 NOTE — Op Note (Signed)
Preop diagnosis: Gallbladder polyp versus stone  Post op diagnosis: Cholelithiasis  Operation: Laparoscopy cholecystectomy and intraoperative cholangiogram  Surgeon: Mckinley Jewel  Assistant:     Anesthesia: Gen.  Complications: None  EBL: Less than 20 mL  Drains: None  Description: Patient was put to sleep in supine position the operating table. The abdomen was prepped and draped out sterile field and timeout performed. The initial port side was placed just below the umbilicus incision was made and the Veress needle with the InnerDyne sleeve was position the peritoneal cavity verified of the hanging drop method. Pneumoperitoneum was obtained and 10 mm port was placed. Camera was introduced with good visualization of the peritoneal cavity. No apparent injury to the underlying structures from initial entry was noted. Epigastric and 2 lateral 5 mm ports were placed. Patient had some mom flimsy strands of adhesion on the anterior surface of the liver cautery peritoneum and likely from prior perihepatitis. No other abnormality of the liver was noted the stomach appeared normal. The gallbladder was noted to be free of any adhesions. It is mildly distended. With cephalad traction the Hartmann's pouch area was pulled up and the cystic duct was isolated first. The cystic artery was somewhat the located at a higher level and therefore initial cholangiogram was accomplished. Kumar clamp and catheter were positioned and cholangiogram was performed showing good filling of the bile duct both proximal and distal with the no evidence of filling defects or obstruction to flow. The catheter was used to decompress the gallbladder and then removed. Cystic duct was hemoclipped and cut. Dissection was carried along the gallbladder bed until the cystic artery was identified which was hemoclipped and cut. The rest of dissection was performed with cautery and the gallbladder freed from the liver. The gallbladder bed was  inspected and in no leak or bleeding was encountered and the clips were noted to be intact. With a 5 mm port the epigastric port site the gallbladder was placed in a retrieval bag and brought out through the umbilical port site. Gallbladder was opened and revealed it to of cholesterol stones about 2-3 mm in size 1 of which was adherent and the other being free. The specimen was sent to pathology. The umbilical fascial defect was closed with 0 Vicryl placed with a suture passer. The right upper quadrant area was inspected again and the remaining fluid was suctioned out. Pneumoperitoneum was released in the remaining ports removed. All skin incisions were closed with subcuticular 4-0 Vicryl reinforced with Steri-Strips and tincture benzoin. Telfa and Tegaderm dressings were placed. Patient subsequently extubated and returned recovery room stable condition.

## 2016-01-15 NOTE — Telephone Encounter (Signed)
Refill request

## 2016-01-15 NOTE — H&P (View-Only) (Signed)
HPI  Christina Fox is a 35 y.o. female here today for a evaluation of her gallbladder. She states it started out as central chest pain. She states she has been having abdominal pain about three months in her right upper and lower quadrants. Patient had a MRCP on 12/30/15. Patient is scheduled for a colonoscopy on 02/15/16.  At the time of her exam about 6 weeks ago plans were for an upper and lower endoscopy. She then canceled with plans to have it completed in [place]. When she contacted that office she reports because she had been seen here they would not see her for an one month. The patient since reestablished herself with the physician who did her original colonoscopy 10 years ago, at which time multiple polyps were reportedly identified, and he recently saw her, obtain laboratory studies and an MRCP.  I personally reviewed the patient's history.  HPI  Past Medical History   Diagnosis  Date   .  Headache(784.0)      sinus   .  GERD (gastroesophageal reflux disease)    .  Kidney stones      no current problem   .  Anxiety    .  Deviated septum  09/2011   .  Nasal turbinate hypertrophy  09/2011     bilat.   .  Complication of anesthesia      states small mouth   .  Pelvic inflammatory disease    .  Urinary tract infection    .  Migraine    .  Hypertension     Past Surgical History   Procedure  Laterality  Date   .  Colonoscopy   2007     [facility]: pedunculated 87mm polyp in distal sigmoid, sessile 77mm polyp at splenic flexure, larger polyp tubulovillous adenoma and smaller polyp tubular adenoma   .  Wisdom tooth extraction   2009   .  Nasal septoplasty w/ turbinoplasty   10/04/2011     Procedure: NASAL SEPTOPLASTY WITH TURBINATE REDUCTION; Surgeon: Ascencion Dike, MD; Location: [place]; Service: ENT; Laterality: Bilateral;   .  Colonoscopy   09-09-15    Family History   Problem  Relation  Age of Onset   .  Kidney cancer  Mother    .  Hypertension  Mother    .   Migraines  Mother    .  Multiple sclerosis  Mother    .  Hypertension  Father    .  Liver disease  Father      liver transplant; had fatty liver disease   .  Spina bifida  Brother    .  Hypertension  Other    .  Migraines  Maternal Grandmother    .  Colon cancer  Paternal Grandfather    .  Fibromyalgia  Mother    .  Liver disease  Mother      NASH    Social History  Social History   Substance Use Topics   .  Smoking status:  Former Smoker -- 0.25 packs/day     Quit date:  08/23/2009   .  Smokeless tobacco:  Never Used   .  Alcohol Use:  No    Allergies   Allergen  Reactions   .  Bentyl [Dicyclomine Hcl]  Other (See Comments)     DIZZINESS   .  Haldol [Haloperidol]  Other (See Comments)     Made her feel very jittery and agitated   .  Meclizine      Chest pain   .  Morphine And Related  Other (See Comments)     Made her feel like she was losing her mind   .  Toradol [Ketorolac Tromethamine]  Other (See Comments)     Made her feel very jittery and agitated   .  Macrobid [Nitrofurantoin Monohyd Macro]  Rash   .  Sulfa Antibiotics  Hives and Rash    Current Outpatient Prescriptions   Medication  Sig  Dispense  Refill   .  clonazePAM (KLONOPIN) 0.5 MG tablet  Take 1 tablet (0.5 mg total) by mouth daily. (Patient taking differently: Take 0.25 mg by mouth 2 (two) times daily. )  30 tablet  5   .  fexofenadine (ALLEGRA) 180 MG tablet  Take 1 tablet (180 mg total) by mouth daily.  60 tablet  3   .  fluticasone (FLONASE) 50 MCG/ACT nasal spray  Place 2 sprays into both nostrils daily.  16 g  6   .  levonorgestrel-ethinyl estradiol (AUBRA) 0.1-20 MG-MCG tablet  Take 1 tablet by mouth daily. Reported on 12/04/2015     .  montelukast (SINGULAIR) 10 MG tablet  TAKE 1 TABLET BY MOUTH DAILY  30 tablet  3   .  Multiple Vitamins-Calcium (ONE-A-DAY WOMENS PO)  Take 1 tablet by mouth every morning.     .  pantoprazole (PROTONIX) 40 MG tablet  Take 1 tablet (40 mg total) by mouth daily.  30  tablet  3   .  sucralfate (CARAFATE) 1 g tablet  Take 1 tablet by mouth daily as needed (stomach).   0   .  [DISCONTINUED] escitalopram (LEXAPRO) 10 MG tablet  Take 10 mg by mouth daily.     .  [DISCONTINUED] ipratropium (ATROVENT) 0.06 % nasal spray  Place 2 sprays into both nostrils 4 (four) times daily. (Patient not taking: Reported on 05/14/2015)  15 mL  1    No current facility-administered medications for this visit.    Review of Systems  Review of Systems  Constitutional: Negative.  Respiratory: Negative.  Cardiovascular: Negative.  Gastrointestinal: Positive for nausea and abdominal pain.   Blood pressure 130/80, pulse 74, resp. rate 12, height 5\' 2"  (1.575 m), weight 179 lb (81.194 kg), last menstrual period 11/27/2015.  Physical Exam  Physical Exam  Constitutional: She is oriented to person, place, and time. She appears well-developed and well-nourished.  Eyes: Conjunctivae are normal. No scleral icterus.  Neck: Neck supple.  Cardiovascular: Normal rate, regular rhythm and normal heart sounds.  Pulmonary/Chest: Effort normal and breath sounds normal.  Abdominal: Soft. Bowel sounds are normal. There is no tenderness.  Lymphadenopathy:  She has no cervical adenopathy.  Neurological: She is alert and oriented to person, place, and time.  Skin: Skin is warm and dry.   Data Reviewed  December 30, 2015 MRCP IMPRESSION:  1. Tiny gallstone. No gallbladder inflammation.  2. Normal biliary tree.  3. Normal liver parenchyma.  Located serum transaminases. Normal bilirubin and alkaline phosphatase.  Assessment   Abdominal pain, questionable biliary source.   Plan   I'm not convinced that the patient's symptoms are related to the biliary tree, by 2 gastroenterologists have deferred further evaluation of her change in bowel habits and diarrhea prior to cholecystectomy.  The patient has been carefully cons that there is no guarantee that gallbladder removal will relieve her symptoms.   Laparoscopic Cholecystectomy with Intraoperative Cholangiogram. The procedure, including it's potential risks and complications (including  but not limited to infection, bleeding, injury to intra-abdominal organs or bile ducts, bile leak, poor cosmetic result, sepsis and death) were discussed with the patient in detail. Non-operative options, including their inherent risks (acute calculous cholecystitis with possible choledocholithiasis or gallstone pancreatitis, with the risk of ascending cholangitis, sepsis, and death) were discussed as well. The patient expressed and understanding of what we discussed and wishes to proceed with laparoscopic cholecystectomy. The patient further understands that if it is technically not possible, or it is unsafe to proceed laparoscopically, that I will convert to an open cholecystectomy.  PCP: Janne Napoleon  This information has been scribed by Gaspar Cola CMA.  Robert Bellow  01/07/2016, 10:24 PM

## 2016-01-15 NOTE — OR Nursing (Signed)
Emptied bladder and dressed for home but pain is 8 now.  Dr. Adela Glimpse notified and ordered 5 mg of roxicodone now

## 2016-01-15 NOTE — Anesthesia Postprocedure Evaluation (Signed)
Anesthesia Post Note  Patient: Christina Fox  Procedure(s) Performed: Procedure(s) (LRB): LAPAROSCOPIC CHOLECYSTECTOMY WITH INTRAOPERATIVE CHOLANGIOGRAM (N/A)  Patient location during evaluation: PACU Anesthesia Type: General Level of consciousness: awake and alert Pain management: pain level controlled Vital Signs Assessment: post-procedure vital signs reviewed and stable Respiratory status: spontaneous breathing, nonlabored ventilation, respiratory function stable and patient connected to nasal cannula oxygen Cardiovascular status: blood pressure returned to baseline and stable Postop Assessment: no signs of nausea or vomiting Anesthetic complications: no    Last Vitals:  Filed Vitals:   01/15/16 1325 01/15/16 1339  BP:  133/78  Pulse: 62 61  Temp:  37.2 C  Resp: 13 16    Last Pain:  Filed Vitals:   01/15/16 1340  PainSc: 7                  Joseph K Piscitello

## 2016-01-15 NOTE — Interval H&P Note (Signed)
History and Physical Interval Note: Dr. Bary Castilla has asked  that I perform the surgery - he updated me on this pt;s history and decision for surgery. Initial H and P was reviewed. OK to proceed with surgery 01/15/2016 10:28 AM  Christina Fox  has presented today for surgery, with the diagnosis of GALLSTONES  The various methods of treatment have been discussed with the patient and family. After consideration of risks, benefits and other options for treatment, the patient has consented to  Procedure(s): LAPAROSCOPIC CHOLECYSTECTOMY WITH INTRAOPERATIVE CHOLANGIOGRAM (N/A) as a surgical intervention .  The patient's history has been reviewed, patient examined, no change in status, stable for surgery.  I have reviewed the patient's chart and labs.  Questions were answered to the patient's satisfaction.     Christina Fox

## 2016-01-18 ENCOUNTER — Encounter: Payer: Self-pay | Admitting: General Surgery

## 2016-01-18 LAB — SURGICAL PATHOLOGY

## 2016-01-19 ENCOUNTER — Telehealth: Payer: Self-pay | Admitting: *Deleted

## 2016-01-19 NOTE — Telephone Encounter (Signed)
Patient called back, she is aware of results and is pleased. She is having shoulder and neck pain. She thinks its coming from the gas pain that she starting experiencing yesterday and has not been able to pass any gas.

## 2016-01-19 NOTE — Telephone Encounter (Signed)
I talked with the patient and her bowels are moving. She is just having gas pressure, aware to use OTC gas medications only if needed, pt agrees.

## 2016-01-19 NOTE — Telephone Encounter (Signed)
-----   Message from Christene Lye, MD sent at 01/19/2016 10:18 AM EDT ----- Rosann Auerbach please let pt pt know the pathology showed cholelthiasis and chronic cholecystitis

## 2016-01-20 ENCOUNTER — Encounter: Payer: Self-pay | Admitting: General Surgery

## 2016-01-20 ENCOUNTER — Ambulatory Visit (INDEPENDENT_AMBULATORY_CARE_PROVIDER_SITE_OTHER): Payer: Self-pay | Admitting: General Surgery

## 2016-01-20 VITALS — BP 152/84 | HR 80 | Resp 14 | Ht 64.0 in | Wt 178.0 lb

## 2016-01-20 DIAGNOSIS — K8010 Calculus of gallbladder with chronic cholecystitis without obstruction: Secondary | ICD-10-CM

## 2016-01-20 NOTE — Progress Notes (Signed)
Patient ID: Christina Fox, female   DOB: Nov 06, 1980, 35 y.o.   MRN: [id number]  Chief Complaint  Patient presents with  . Routine Post Op    gallbladder    HPI Christina Fox is a 35 y.o. female.  Here today for postoperative visit, 5 days s/p laparoscopic cholecystectomy. Patient states she is still sore. She does have a slight cough. Bowels are moving regularly. She is here today with her daughter, Christina Fox. I have reviewed the history of present illness with the patient.  HPI  Past Medical History:  Diagnosis Date  . Anxiety   . Complication of anesthesia    states small mouth  . Deviated septum 09/2011  . GERD (gastroesophageal reflux disease)   . Headache(784.0)    sinus  . Hypertension    no meds  . Kidney stones    no current problem  . Migraine   . Nasal turbinate hypertrophy 09/2011   bilat.  . Pelvic inflammatory disease   . Urinary tract infection     Past Surgical History:  Procedure Laterality Date  . CHOLECYSTECTOMY N/A 01/15/2016   Procedure: LAPAROSCOPIC CHOLECYSTECTOMY WITH INTRAOPERATIVE CHOLANGIOGRAM;  Surgeon: Christene Lye, MD;  Location: [facility];  Service: General;  Laterality: N/A;  . COLONOSCOPY  2007   [facility]: pedunculated 75mm polyp in distal sigmoid, sessile 1mm polyp at splenic flexure, larger polyp tubulovillous adenoma and smaller polyp tubular adenoma   . COLONOSCOPY  09-09-15  . NASAL SEPTOPLASTY W/ TURBINOPLASTY  10/04/2011   Procedure: NASAL SEPTOPLASTY WITH TURBINATE REDUCTION;  Surgeon: Ascencion Dike, MD;  Location: [place];  Service: ENT;  Laterality: Bilateral;  . WISDOM TOOTH EXTRACTION  2009    Family History  Problem Relation Age of Onset  . Kidney cancer Mother   . Hypertension Mother   . Migraines Mother   . Multiple sclerosis Mother   . Fibromyalgia Mother   . Liver disease Mother     NASH   . Hypertension Father   . Liver disease Father     liver transplant; had fatty liver disease  . Spina  bifida Brother   . Migraines Maternal Grandmother   . Hypertension Other   . Colon cancer Paternal Grandfather     Social History Social History  Substance Use Topics  . Smoking status: Former Smoker    Packs/day: 0.25    Years: 10.00    Quit date: 08/23/2008  . Smokeless tobacco: Never Used  . Alcohol use No    Allergies  Allergen Reactions  . Bentyl [Dicyclomine Hcl] Other (See Comments)    DIZZINESS  . Haldol [Haloperidol] Other (See Comments)    Made her feel very jittery and agitated  . Meclizine     Chest pain  . Morphine And Related Other (See Comments)    Made her feel like she was losing her mind  . Toradol [Ketorolac Tromethamine] Other (See Comments)    Made her feel very jittery and agitated  . Macrobid [Nitrofurantoin Monohyd Macro] Rash  . Sulfa Antibiotics Hives and Rash    Current Outpatient Prescriptions  Medication Sig Dispense Refill  . clonazePAM (KLONOPIN) 0.5 MG tablet Take 1 tablet (0.5 mg total) by mouth daily. (Patient taking differently: Take 0.25 mg by mouth 2 (two) times daily. ) 30 tablet 5  . fexofenadine (ALLEGRA) 180 MG tablet Take 1 tablet (180 mg total) by mouth daily. (Patient taking differently: Take 180 mg by mouth at bedtime. ) 60 tablet 3  .  fluticasone (FLONASE) 50 MCG/ACT nasal spray Place 2 sprays into both nostrils daily. 16 g 6  . ibuprofen (ADVIL) 200 MG tablet Take 200 mg by mouth every 6 (six) hours as needed.    Marland Kitchen levonorgestrel-ethinyl estradiol (AUBRA) 0.1-20 MG-MCG tablet Take 1 tablet by mouth daily. Reported on 12/04/2015    . montelukast (SINGULAIR) 10 MG tablet TAKE 1 TABLET BY MOUTH DAILY 30 tablet 2  . Multiple Vitamins-Calcium (ONE-A-DAY WOMENS PO) Take 1 tablet by mouth every morning.    Marland Kitchen oxyCODONE-acetaminophen (ROXICET) 5-325 MG tablet Take 1 tablet by mouth every 4 (four) hours as needed. 30 tablet 0  . pantoprazole (PROTONIX) 40 MG tablet Take 1 tablet (40 mg total) by mouth daily. (Patient taking differently:  Take 40 mg by mouth every morning. ) 30 tablet 3  . potassium chloride SA (K-DUR,KLOR-CON) 20 MEQ tablet Take 20 mEq by mouth 2 (two) times a week.     No current facility-administered medications for this visit.     Review of Systems Review of Systems  Constitutional: Negative.   Respiratory: Positive for cough.   Cardiovascular: Negative.   Gastrointestinal: Positive for abdominal pain.    Blood pressure (!) 152/84, pulse 80, resp. rate 14, height 5\' 4"  (1.626 m), weight 178 lb (80.7 kg), last menstrual period 11/27/2015.  Physical Exam Physical Exam  Constitutional: She is oriented to person, place, and time. She appears well-developed and well-nourished.  HENT:  Mouth/Throat: Oropharynx is clear and moist.  Cardiovascular: Normal rate, regular rhythm and normal heart sounds.   Pulmonary/Chest: Effort normal and breath sounds normal.  Abdominal: Soft. Normal appearance and bowel sounds are normal. She exhibits no distension. There is tenderness. There is no guarding.    Port sites healing well. Steri strips intact.  Neurological: She is alert and oriented to person, place, and time.  Skin: Skin is warm and dry.  Psychiatric: Her behavior is normal.    Data Reviewed Pathology and prior progress notes.  Assessment    Stable postoperative exam.    Plan    Follow up with gastroenterologist as scheduled.  Resume activities as tolerated.     PCP Lottie Mussel MD This information has been scribed by Karie Fetch RN, BSN,BC.   Daundre Biel G 01/22/2016, 12:07 PM

## 2016-01-20 NOTE — Patient Instructions (Addendum)
The patient is aware to call back for any questions or concerns. Resume activities as tolerated.  

## 2016-01-21 ENCOUNTER — Other Ambulatory Visit: Payer: Self-pay | Admitting: Gastroenterology

## 2016-01-21 MED FILL — TRILYTE WITH FLAVOR PACKETS: 420 | 1 days supply | Qty: 4000 | Fill #0

## 2016-01-21 NOTE — Telephone Encounter (Signed)
Pathology showed chronic cholecystitis, cholelithiasis.

## 2016-01-22 ENCOUNTER — Encounter: Payer: Self-pay | Admitting: General Surgery

## 2016-01-22 ENCOUNTER — Ambulatory Visit (INDEPENDENT_AMBULATORY_CARE_PROVIDER_SITE_OTHER): Payer: Self-pay | Admitting: Physician Assistant

## 2016-01-22 ENCOUNTER — Ambulatory Visit (INDEPENDENT_AMBULATORY_CARE_PROVIDER_SITE_OTHER): Payer: Self-pay

## 2016-01-22 VITALS — BP 124/86 | HR 106 | Temp 98.2°F | Resp 18 | Ht 64.0 in | Wt 176.2 lb

## 2016-01-22 DIAGNOSIS — R05 Cough: Secondary | ICD-10-CM

## 2016-01-22 DIAGNOSIS — R059 Cough, unspecified: Secondary | ICD-10-CM

## 2016-01-22 MED ORDER — AZITHROMYCIN 250 MG PO TABS: ORAL_TABLET | ORAL | 0 refills | Status: DC

## 2016-01-22 MED ORDER — BENZONATATE 100 MG PO CAPS: 100.0000 mg | ORAL_CAPSULE | Freq: Three times a day (TID) | ORAL | 0 refills | Status: DC | PRN

## 2016-01-22 MED ORDER — DOXYCYCLINE HYCLATE 100 MG PO CAPS: 100.0000 mg | ORAL_CAPSULE | Freq: Two times a day (BID) | ORAL | 0 refills | Status: AC

## 2016-01-22 MED FILL — DOXYCYCLINE HYC 100 MG TAB: 100 | 10 days supply | Qty: 20 | Fill #0

## 2016-01-22 MED FILL — AZITHROMYCIN 250 MG TABLET: 250 | 5 days supply | Qty: 6 | Fill #0

## 2016-01-22 MED FILL — BENZONATATE 100 MG CAPSULE: 100 | 6 days supply | Qty: 40 | Fill #0

## 2016-01-22 NOTE — Patient Instructions (Addendum)
Your chest x-ray showed no evidence of pneumonia.   Please take the following medications:  Doxycycline  Tessalon Pearls   Work on deep breathing exercises.  Return to clinic if your symptoms worsen or if you do not improve in 7-10 days.    IF you received an x-ray today, you will receive an invoice from [facility]. Please contact [facility] at [phone] with questions or concerns regarding your invoice.   IF you received labwork today, you will receive an invoice from [employer]. Please contact [facility] at [phone] with questions or concerns regarding your invoice.   Our billing staff will not be able to assist you with questions regarding bills from these companies.  You will be contacted with the lab results as soon as they are available. The fastest way to get your results is to activate your My Chart account. Instructions are located on the last page of this paperwork. If you have not heard from [place] regarding the results in 2 weeks, please contact this office.

## 2016-01-22 NOTE — Progress Notes (Signed)
Christina Fox  MRN: [id number] DOB: 30-Mar-1981  PCP: Maren Reamer, MD  Subjective:  Pt presents to clinic for chest pain. She reports pain in the center of her chest. Nothing makes it better. She denies fever, but reports chills two days ago.  She had a cholecystectomy one week ago and reports having a productive cough with yellowish-green sputum since that time. Her cough has kept her up at night for the past two nights.   Review of Systems  Constitutional: Positive for chills and fatigue. Negative for fever.  Respiratory: Positive for cough, chest tightness and shortness of breath. Negative for wheezing.   Cardiovascular: Positive for chest pain. Negative for palpitations.    Patient Active Problem List   Diagnosis Date Noted  . Gallstone 01/07/2016  . History of colonic polyps 11/27/2015  . Right lower quadrant abdominal pain 11/27/2015  . HTN (hypertension), benign 11/24/2015  . Abdominal pain 11/16/2015  . Headache 10/13/2015  . 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 Anxiety disorder 11/03/2014    Current Outpatient Prescriptions on File Prior to Visit  Medication Sig Dispense Refill  . clonazePAM (KLONOPIN) 0.5 MG tablet Take 1 tablet (0.5 mg total) by mouth daily. (Patient taking differently: Take 0.25 mg by mouth 2 (two) times daily. ) 30 tablet 5  . fexofenadine (ALLEGRA) 180 MG tablet Take 1 tablet (180 mg total) by mouth daily. (Patient taking differently: Take 180 mg by mouth at bedtime. ) 60 tablet 3  . fluticasone (FLONASE) 50 MCG/ACT nasal spray Place 2 sprays into both nostrils daily. 16 g 6  . ibuprofen (ADVIL) 200 MG tablet Take 200 mg by mouth every 6 (six) hours as needed.    Marland Kitchen levonorgestrel-ethinyl estradiol (AUBRA) 0.1-20 MG-MCG tablet Take 1 tablet by mouth daily. Reported on 12/04/2015    . montelukast (SINGULAIR) 10 MG tablet TAKE 1 TABLET BY MOUTH DAILY 30 tablet 2  . Multiple Vitamins-Calcium (ONE-A-DAY WOMENS PO) Take 1 tablet by mouth every morning.    Marland Kitchen  oxyCODONE-acetaminophen (ROXICET) 5-325 MG tablet Take 1 tablet by mouth every 4 (four) hours as needed. 30 tablet 0  . pantoprazole (PROTONIX) 40 MG tablet Take 1 tablet (40 mg total) by mouth daily. (Patient taking differently: Take 40 mg by mouth every morning. ) 30 tablet 3  . potassium chloride SA (K-DUR,KLOR-CON) 20 MEQ tablet Take 20 mEq by mouth 2 (two) times a week.    . [DISCONTINUED] escitalopram (LEXAPRO) 10 MG tablet Take 10 mg by mouth daily.      . [DISCONTINUED] ipratropium (ATROVENT) 0.06 % nasal spray Place 2 sprays into both nostrils 4 (four) times daily. (Patient not taking: Reported on 05/14/2015) 15 mL 1   No current facility-administered medications on file prior to visit.     Allergies  Allergen Reactions  . Bentyl [Dicyclomine Hcl] Other (See Comments)    DIZZINESS  . Haldol [Haloperidol] Other (See Comments)    Made her feel very jittery and agitated  . Meclizine     Chest pain  . Morphine And Related Other (See Comments)    Made her feel like she was losing her mind  . Toradol [Ketorolac Tromethamine] Other (See Comments)    Made her feel very jittery and agitated  . Macrobid [Nitrofurantoin Monohyd Macro] Rash  . Sulfa Antibiotics Hives and Rash      Objective:  BP 124/86   Pulse (!) 106   Temp 98.2 F (36.8 C) (Oral)   Resp 18   Ht 5\' 4"  (  1.626 m)   Wt 176 lb 3.2 oz (79.9 kg)   SpO2 98%   BMI 30.24 kg/m   Physical Exam  Constitutional: She is oriented to person, place, and time and well-developed, well-nourished, and in no distress.  Cardiovascular: Normal rate, regular rhythm, normal heart sounds and intact distal pulses.   Pulmonary/Chest: Effort normal. She exhibits tenderness (Intercostal muscles, anterior mid-chest. ).  Abdominal: She exhibits no distension and no mass. There is no tenderness. There is no guarding.  Laparoscopic surgical scars are healing well. No signs of erythema, drainage or streaking noted.   Musculoskeletal:  Tenderness: Intercostals, anterior mid-chest.  Neurological: She is alert and oriented to person, place, and time.  Skin: Skin is warm and dry.  Psychiatric: Mood, memory, affect and judgment normal.    Dg Chest 2 View  Result Date: 01/22/2016 CLINICAL DATA:  Cough and shortness of breath. Cholecystectomy 1 week prior EXAM: CHEST  2 VIEW COMPARISON:  December 01, 2015 FINDINGS: There is atelectatic change in the right lower lobe region. Lungs elsewhere clear. Heart size and pulmonary vascularity are normal. No adenopathy. No pneumothorax. No bone lesions. IMPRESSION: Right lower lobe atelectasis.  No edema or consolidation. Electronically Signed   By: Lowella Grip III M.D.   On: 01/22/2016 15:21   Assessment and Plan :  Cough - Plan: DG Chest 2 View, benzonatate (TESSALON) 100 MG capsule, doxycycline (VIBRAMYCIN) 100 MG capsule. Supportive care. X-ray does not show evidence of infection. However, there is concern for pneumonia due to her recent surgery.     Mercer Pod, PA-C  [facility] 01/22/2016 4:04 PM

## 2016-01-25 ENCOUNTER — Ambulatory Visit (HOSPITAL_COMMUNITY)
Admission: RE | Admit: 2016-01-25 | Payer: No Typology Code available for payment source | Source: Ambulatory Visit | Admitting: Gastroenterology

## 2016-01-25 ENCOUNTER — Encounter (HOSPITAL_COMMUNITY): Admission: RE | Payer: Self-pay | Source: Ambulatory Visit

## 2016-01-25 SURGERY — COLONOSCOPY WITH PROPOFOL
Anesthesia: Monitor Anesthesia Care

## 2016-01-25 MED ORDER — PROPOFOL 10 MG/ML IV BOLUS
INTRAVENOUS | Status: AC
  Filled 2016-01-25: qty 60

## 2016-01-28 ENCOUNTER — Ambulatory Visit: Payer: Self-pay | Attending: Internal Medicine | Admitting: Physician Assistant

## 2016-01-28 ENCOUNTER — Encounter (HOSPITAL_COMMUNITY): Payer: Self-pay

## 2016-01-28 VITALS — BP 131/88 | HR 85 | Temp 98.0°F | Resp 16 | Wt 177.2 lb

## 2016-01-28 DIAGNOSIS — E876 Hypokalemia: Secondary | ICD-10-CM

## 2016-01-28 DIAGNOSIS — B373 Candidiasis of vulva and vagina: Secondary | ICD-10-CM

## 2016-01-28 DIAGNOSIS — B3731 Acute candidiasis of vulva and vagina: Secondary | ICD-10-CM

## 2016-01-28 DIAGNOSIS — R7989 Other specified abnormal findings of blood chemistry: Secondary | ICD-10-CM

## 2016-01-28 DIAGNOSIS — R945 Abnormal results of liver function studies: Secondary | ICD-10-CM

## 2016-01-28 LAB — COMPREHENSIVE METABOLIC PANEL
ALBUMIN: 4.1 g/dL (ref 3.6–5.1)
ALT: 48 U/L — AB (ref 6–29)
AST: 27 U/L (ref 10–30)
Alkaline Phosphatase: 60 U/L (ref 33–115)
BILIRUBIN TOTAL: 0.4 mg/dL (ref 0.2–1.2)
BUN: 7 mg/dL (ref 7–25)
CO2: 27 mmol/L (ref 20–31)
CREATININE: 0.74 mg/dL (ref 0.50–1.10)
Calcium: 9.3 mg/dL (ref 8.6–10.2)
Chloride: 101 mmol/L (ref 98–110)
Glucose, Bld: 81 mg/dL (ref 65–99)
Potassium: 3.5 mmol/L (ref 3.5–5.3)
SODIUM: 138 mmol/L (ref 135–146)
TOTAL PROTEIN: 6.7 g/dL (ref 6.1–8.1)

## 2016-01-28 MED ORDER — FLUCONAZOLE 150 MG PO TABS: 150.0000 mg | ORAL_TABLET | Freq: Once | ORAL | 0 refills | Status: AC

## 2016-01-28 MED FILL — ?PANTOPRAZOLE SOD DR 40MG: 40 MG | 30 days supply | Qty: 30 | Fill #0

## 2016-01-28 MED FILL — FLUCONAZOLE 150 MG TABLET: 150 | 5 days supply | Qty: 2 | Fill #0

## 2016-01-28 NOTE — Progress Notes (Signed)
Pt is in the office today for yeast infection Pt states she had gallbladder surgery 2 weeks ago Pt is in no pain today

## 2016-01-28 NOTE — Progress Notes (Signed)
Patient ID: Christina Fox, female   DOB: 05/25/1981, 34 y.o.   MRN: [id number]   Christina Fox, is a 35 y.o. female  [id number]  [id number]  DOB - 06/16/1981  Subjective:  Chief Complaint and HPI: Christina Fox is a 35 y.o. female here today for vaginal itching 1 week into taking Doxycycline to cover for possible pneumonia/atypicals s/p recent surgery.  She is 2 weeks post cholecystectomy.  She is improving.  No f/c.  She also wants to have her LFTs  And potassium rechecked since having had her gallbladder removed.    +thick whitish vaginal d/c.  She says this always happens to her within a few days of starting an antibiotic.  No pelvic pain.  No STD risk factors.    ED/Hospital notes reviewed.    ROS:   Constitutional:  No f/c, No night sweats, No unexplained weight loss. EENT:  No vision changes, No blurry vision, No hearing changes. No mouth, throat, or ear problems.  Respiratory: cough and SOB improving on Doxy Cardiac: No CP, no palpitations GI:  No abd pain, No N/V/D. GU: No Urinary s/sx Musculoskeletal: No joint pain Neuro: No headache, no dizziness, no motor weakness.  Skin: No rash Endocrine:  No polydipsia. No polyuria.  Psych: Denies SI/HI  No problems updated.  ALLERGIES: Allergies  Allergen Reactions  . Bentyl [Dicyclomine Hcl] Other (See Comments)    DIZZINESS  . Haldol [Haloperidol] Other (See Comments)    Made her feel very jittery and agitated  . Meclizine     Chest pain  . Morphine And Related Other (See Comments)    Made her feel like she was losing her mind  . Toradol [Ketorolac Tromethamine] Other (See Comments)    Made her feel very jittery and agitated  . Macrobid [Nitrofurantoin Monohyd Macro] Rash  . Sulfa Antibiotics Hives and Rash    PAST MEDICAL HISTORY: Past Medical History:  Diagnosis Date  . Anxiety   . Complication of anesthesia    states small mouth  . Deviated septum 09/2011  . GERD (gastroesophageal reflux disease)     . Headache(784.0)    sinus  . Hypertension    no meds  . Kidney stones    no current problem  . Migraine   . Nasal turbinate hypertrophy 09/2011   bilat.  . Pelvic inflammatory disease   . Urinary tract infection     MEDICATIONS AT HOME: Prior to Admission medications   Medication Sig Start Date End Date Taking? Authorizing Provider  benzonatate (TESSALON) 100 MG capsule Take 1-2 capsules (100-200 mg total) by mouth 3 (three) times daily as needed for cough. 01/22/16  Yes Ree Shay McVey, PA-C  clonazePAM (KLONOPIN) 0.5 MG tablet Take 1 tablet (0.5 mg total) by mouth daily. Patient taking differently: Take 0.25 mg by mouth 2 (two) times daily.  10/20/15  Yes Robyn Haber, MD  doxycycline (VIBRAMYCIN) 100 MG capsule Take 1 capsule (100 mg total) by mouth 2 (two) times daily. 01/22/16 02/01/16 Yes Elizabeth W McVey, PA-C  fexofenadine (ALLEGRA) 180 MG tablet Take 1 tablet (180 mg total) by mouth daily. Patient taking differently: Take 180 mg by mouth at bedtime.  12/22/15  Yes Maren Reamer, MD  fluticasone (FLONASE) 50 MCG/ACT nasal spray Place 2 sprays into both nostrils daily. 12/22/15  Yes Maren Reamer, MD  ibuprofen (ADVIL) 200 MG tablet Take 200 mg by mouth every 6 (six) hours as needed.   Yes Historical Provider, MD  levonorgestrel-ethinyl estradiol Thompson Caul)  0.1-20 MG-MCG tablet Take 1 tablet by mouth daily. Reported on 12/04/2015   Yes Historical Provider, MD  montelukast (SINGULAIR) 10 MG tablet TAKE 1 TABLET BY MOUTH DAILY 01/18/16  Yes Maren Reamer, MD  Multiple Vitamins-Calcium (ONE-A-DAY WOMENS PO) Take 1 tablet by mouth every morning.   Yes Historical Provider, MD  pantoprazole (PROTONIX) 40 MG tablet Take 1 tablet (40 mg total) by mouth daily. 10/27/15  Yes Maren Reamer, MD  potassium chloride SA (K-DUR,KLOR-CON) 20 MEQ tablet Take 20 mEq by mouth 2 (two) times a week.   Yes Historical Provider, MD  fluconazole (DIFLUCAN) 150 MG tablet Take 1 tablet (150 mg total) by  mouth once. And repeat in 5 days 01/28/16 01/28/16  Argentina Donovan, PA-C  oxyCODONE-acetaminophen (ROXICET) 5-325 MG tablet Take 1 tablet by mouth every 4 (four) hours as needed. Patient not taking: Reported on 01/28/2016 01/15/16   Seeplaputhur Robinette Haines, MD     Objective:  EXAM:   Vitals:   01/28/16 1356  BP: 131/88  Pulse: 85  Resp: 16  Temp: 98 F (36.7 C)  TempSrc: Oral  SpO2: 96%  Weight: 177 lb 3.2 oz (80.4 kg)    General appearance : A&OX3. NAD. Non-toxic-appearing HEENT: Atraumatic and Normocephalic.  PERRLA. EOM intact.  TM clear B. Mouth-MMM, post pharynx WNL w/o erythema, No PND. Neck: supple, no JVD. No cervical lymphadenopathy. No thyromegaly Chest/Lungs:  Breathing-non-labored, Good air entry bilaterally, breath sounds normal without rales, rhonchi, or wheezing  CVS: S1 S2 regular, no murmurs, gallops, rubs  Abdomen: Bowel sounds present, Non tender and not distended with no gaurding, rigidity or rebound. Extremities: Bilateral Lower Ext shows no edema, both legs are warm to touch with = pulse throughout Neurology:  CN II-XII grossly intact, Non focal.   Psych:  TP linear. J/I WNL. Normal speech. Appropriate eye contact and affect.  Skin:  No Rash  Data Review Lab Results  Component Value Date   HGBA1C 5.2 10/27/2015     Assessment & Plan   1. Yeast vaginitis Secondary to antibiotic use - fluconazole (DIFLUCAN) 150 MG tablet; Take 1 tablet (150 mg total) by mouth once. And repeat in 5 days  Dispense: 2 tablet; Refill: 0  2. Elevated LFTs Should be improving s/p cholecystectomy - Comprehensive metabolic panel  3. Hypokalemia - Comprehensive metabolic panel  Answered general questions about surgery and recovery.  Spent ~45 mins face to face with patient.   Drink lots of water.  Continue with weight loss efforts because BP has been improving.  Slowly increase activity as tolerated s/p surgery and chest infection.  Patient have been counseled extensively  about nutrition and exercise  F/up with Dr. Janne Napoleon in 3 weeks. The patient was given clear instructions to go to ER or return to medical center if symptoms don't improve, worsen or new problems develop. The patient verbalized understanding. The patient was told to call to get lab results if they haven't heard anything in the next week.     Freeman Caldron, PA-C [facility] and [place], [place]   01/28/2016, 4:50 PM

## 2016-01-29 ENCOUNTER — Telehealth: Payer: Self-pay

## 2016-01-29 MED FILL — ?FEXOFENADINE HCL 180 MG TA: 180 | 25 days supply | Qty: 25 | Fill #1

## 2016-01-29 NOTE — Telephone Encounter (Signed)
Contacted pt to go over lab results pt is aware of results and doesn't have any questions or concerns 

## 2016-02-09 ENCOUNTER — Emergency Department (HOSPITAL_BASED_OUTPATIENT_CLINIC_OR_DEPARTMENT_OTHER)
Admission: EM | Admit: 2016-02-09 | Discharge: 2016-02-09 | Disposition: A | Discharge status: Home / Self Care | Payer: Medicaid Other | Attending: Emergency Medicine | Admitting: Emergency Medicine

## 2016-02-09 ENCOUNTER — Encounter (HOSPITAL_BASED_OUTPATIENT_CLINIC_OR_DEPARTMENT_OTHER): Payer: Self-pay | Admitting: *Deleted

## 2016-02-09 ENCOUNTER — Ambulatory Visit: Payer: Self-pay | Admitting: Family Medicine

## 2016-02-09 ENCOUNTER — Emergency Department (HOSPITAL_BASED_OUTPATIENT_CLINIC_OR_DEPARTMENT_OTHER): Payer: Medicaid Other

## 2016-02-09 DIAGNOSIS — Z87891 Personal history of nicotine dependence: Secondary | ICD-10-CM | POA: Insufficient documentation

## 2016-02-09 DIAGNOSIS — I10 Essential (primary) hypertension: Secondary | ICD-10-CM | POA: Diagnosis not present

## 2016-02-09 DIAGNOSIS — R109 Unspecified abdominal pain: Secondary | ICD-10-CM | POA: Insufficient documentation

## 2016-02-09 DIAGNOSIS — M549 Dorsalgia, unspecified: Secondary | ICD-10-CM | POA: Diagnosis present

## 2016-02-09 DIAGNOSIS — M545 Low back pain, unspecified: Secondary | ICD-10-CM

## 2016-02-09 DIAGNOSIS — R42 Dizziness and giddiness: Secondary | ICD-10-CM | POA: Diagnosis not present

## 2016-02-09 LAB — URINALYSIS, ROUTINE W REFLEX MICROSCOPIC
BILIRUBIN URINE: NEGATIVE
GLUCOSE, UA: NEGATIVE mg/dL
HGB URINE DIPSTICK: NEGATIVE
KETONES UR: NEGATIVE mg/dL
Nitrite: NEGATIVE
PROTEIN: NEGATIVE mg/dL
Specific Gravity, Urine: 1.011 (ref 1.005–1.030)
pH: 6 (ref 5.0–8.0)

## 2016-02-09 LAB — COMPREHENSIVE METABOLIC PANEL
ALBUMIN: 3.7 g/dL (ref 3.5–5.0)
ALK PHOS: 51 U/L (ref 38–126)
ALT: 30 U/L (ref 14–54)
ANION GAP: 6 (ref 5–15)
AST: 20 U/L (ref 15–41)
BILIRUBIN TOTAL: 0.5 mg/dL (ref 0.3–1.2)
BUN: 7 mg/dL (ref 6–20)
CALCIUM: 9.1 mg/dL (ref 8.9–10.3)
CO2: 27 mmol/L (ref 22–32)
CREATININE: 0.74 mg/dL (ref 0.44–1.00)
Chloride: 104 mmol/L (ref 101–111)
GFR calc non Af Amer: >60 mL/min (ref >60)
GLUCOSE: 93 mg/dL (ref 65–99)
Potassium: 3.6 mmol/L (ref 3.5–5.1)
Sodium: 137 mmol/L (ref 135–145)
TOTAL PROTEIN: 7.1 g/dL (ref 6.5–8.1)

## 2016-02-09 LAB — CBC WITH DIFFERENTIAL/PLATELET
BASOS ABS: 0.1 10*3/uL (ref 0.0–0.1)
BASOS PCT: 1 %
EOS ABS: 0.7 10*3/uL (ref 0.0–0.7)
Eosinophils Relative: 9 %
HEMATOCRIT: 38.9 % (ref 36.0–46.0)
Hemoglobin: 13.3 g/dL (ref 12.0–15.0)
Lymphocytes Relative: 22 %
Lymphs Abs: 1.6 10*3/uL (ref 0.7–4.0)
MCH: 30.5 pg (ref 26.0–34.0)
MCHC: 34.2 g/dL (ref 30.0–36.0)
MCV: 89.2 fL (ref 78.0–100.0)
MONO ABS: 0.7 10*3/uL (ref 0.1–1.0)
MONOS PCT: 10 %
NEUTROS ABS: 4.3 10*3/uL (ref 1.7–7.7)
Neutrophils Relative %: 58 %
PLATELETS: 302 10*3/uL (ref 150–400)
RBC: 4.36 MIL/uL (ref 3.87–5.11)
RDW: 12.8 % (ref 11.5–15.5)
WBC: 7.4 10*3/uL (ref 4.0–10.5)

## 2016-02-09 LAB — URINE MICROSCOPIC-ADD ON

## 2016-02-09 LAB — PREGNANCY, URINE: PREG TEST UR: NEGATIVE

## 2016-02-09 MED ORDER — SODIUM CHLORIDE 0.9 % IV BOLUS (SEPSIS)
500.0000 mL | Freq: Once | INTRAVENOUS | Status: AC
  Administered 2016-02-09: 500 mL via INTRAVENOUS

## 2016-02-09 MED ORDER — IBUPROFEN 400 MG PO TABS
600.0000 mg | ORAL_TABLET | Freq: Once | ORAL | Status: AC
  Administered 2016-02-09: 600 mg via ORAL
  Filled 2016-02-09: qty 1

## 2016-02-09 MED ORDER — IOPAMIDOL (ISOVUE-300) INJECTION 61%
100.0000 mL | Freq: Once | INTRAVENOUS | Status: AC | PRN
  Administered 2016-02-09: 100 mL via INTRAVENOUS

## 2016-02-09 NOTE — Discharge Instructions (Signed)
You have been seen today for back pain. Your lab tests showed no abnormalities. The CT scan showed a small fluid collection near where the gallbladder was. This may be giving you some discomfort. There is no evidence to suggest that there is an infection in this area. Follow-up with your surgeon should symptoms continue. Follow up with PCP as needed. Return to ED should symptoms worsen. Ibuprofen, naproxen, or Tylenol as needed for pain.

## 2016-02-09 NOTE — ED Provider Notes (Signed)
[facility] DEPT [facility] Provider Note   CSN: [id number] Arrival date & time: 02/09/16  1657  By signing my name below, I, [name], attest that this documentation has been prepared under the direction and in the presence of non-physician practitioner, Arlean Hopping, PA-C. Electronically Signed: [name], Scribe. 02/09/2016. 5:20 PM.   History   Chief Complaint Chief Complaint  Patient presents with  . Back Pain    The history is provided by the patient. No language interpreter was used.  HPI Comments: Christina Fox is a 35 y.o. female who presents to the Emergency Department complaining of intermittent moderate back pain that started about 7 days ago and now has stabbing back pain radiating to her right abdomen, which she currently rates as a 6/10 in severity. She reports that she currently has a right kidney stone, and it has been there for the past year.  She states the current pain feels like a kidney stone. She reports she had a cholecystectomy a few weeks ago. She denies any vaginal discharge, fever, nausea, vomiting, diarrhea, bloody stool, dysuria, frequent urination, or any other complaints. Pt requests no narcotic analgesics.    She reports that she is on [place] contraceptive, so she has no menstrual cycles.    PCP: Maren Reamer, MD Past Medical History:  Diagnosis Date  . Anxiety   . Complication of anesthesia    states small mouth  . Deviated septum 09/2011  . GERD (gastroesophageal reflux disease)   . Headache(784.0)    sinus  . Hypertension    no meds  . Kidney stones    no current problem  . Migraine   . Nasal turbinate hypertrophy 09/2011   bilat.  . Pelvic inflammatory disease   . Urinary tract infection     Patient Active Problem List   Diagnosis Date Noted  . Gallstone 01/07/2016  . History of colonic polyps 11/27/2015  . Right lower quadrant abdominal pain 11/27/2015  . HTN (hypertension), benign 11/24/2015  . Abdominal pain 11/16/2015    . Headache 10/13/2015  . 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 Anxiety disorder 11/03/2014    Past Surgical History:  Procedure Laterality Date  . CHOLECYSTECTOMY N/A 01/15/2016   Procedure: LAPAROSCOPIC CHOLECYSTECTOMY WITH INTRAOPERATIVE CHOLANGIOGRAM;  Surgeon: Christene Lye, MD;  Location: [facility] ORS;  Service: General;  Laterality: N/A;  . COLONOSCOPY  2007   [facility]: pedunculated 25mm polyp in distal sigmoid, sessile 35mm polyp at splenic flexure, larger polyp tubulovillous adenoma and smaller polyp tubular adenoma   . COLONOSCOPY  09-09-15  . NASAL SEPTOPLASTY W/ TURBINOPLASTY  10/04/2011   Procedure: NASAL SEPTOPLASTY WITH TURBINATE REDUCTION;  Surgeon: Ascencion Dike, MD;  Location: [facility];  Service: ENT;  Laterality: Bilateral;  . WISDOM TOOTH EXTRACTION  2009    OB History    Gravida Para Term Preterm AB Living   4 4 4     4    SAB TAB Ectopic Multiple Live Births                  Obstetric Comments   1st Menstrual Cycle:  10  1st Pregnancy:  16      Home Medications    Prior to Admission medications   Medication Sig Start Date End Date Taking? Authorizing Provider  benzonatate (TESSALON) 100 MG capsule Take 1-2 capsules (100-200 mg total) by mouth 3 (three) times daily as needed for cough. 01/22/16   Ree Shay McVey, PA-C  clonazePAM (KLONOPIN) 0.5 MG  tablet Take 1 tablet (0.5 mg total) by mouth daily. Patient taking differently: Take 0.25 mg by mouth 2 (two) times daily.  10/20/15   Robyn Haber, MD  fexofenadine (ALLEGRA) 180 MG tablet Take 1 tablet (180 mg total) by mouth daily. Patient taking differently: Take 180 mg by mouth at bedtime.  12/22/15   Maren Reamer, MD  fluticasone (FLONASE) 50 MCG/ACT nasal spray Place 2 sprays into both nostrils daily. 12/22/15   Maren Reamer, MD  ibuprofen (ADVIL) 200 MG tablet Take 200 mg by mouth every 6 (six) hours as needed.    Historical Provider, MD  levonorgestrel-ethinyl estradiol (AUBRA) 0.1-20 MG-MCG  tablet Take 1 tablet by mouth daily. Reported on 12/04/2015    Historical Provider, MD  montelukast (SINGULAIR) 10 MG tablet TAKE 1 TABLET BY MOUTH DAILY 01/18/16   Maren Reamer, MD  Multiple Vitamins-Calcium (ONE-A-DAY WOMENS PO) Take 1 tablet by mouth every morning.    Historical Provider, MD  oxyCODONE-acetaminophen (ROXICET) 5-325 MG tablet Take 1 tablet by mouth every 4 (four) hours as needed. Patient not taking: Reported on 01/28/2016 01/15/16   Seeplaputhur Robinette Haines, MD  pantoprazole (PROTONIX) 40 MG tablet Take 1 tablet (40 mg total) by mouth daily. 10/27/15   Maren Reamer, MD  potassium chloride SA (K-DUR,KLOR-CON) 20 MEQ tablet Take 20 mEq by mouth 2 (two) times a week.    Historical Provider, MD    Family History Family History  Problem Relation Age of Onset  . Kidney cancer Mother   . Hypertension Mother   . Migraines Mother   . Multiple sclerosis Mother   . Fibromyalgia Mother   . Liver disease Mother     NASH   . Hypertension Father   . Liver disease Father     liver transplant; had fatty liver disease  . Spina bifida Brother   . Migraines Maternal Grandmother   . Hypertension Other   . Colon cancer Paternal Grandfather     Social History Social History  Substance Use Topics  . Smoking status: Former Smoker    Packs/day: 0.25    Years: 10.00    Quit date: 08/23/2008  . Smokeless tobacco: Never Used  . Alcohol use No     Allergies   Bentyl [dicyclomine hcl]; Haldol [haloperidol]; Meclizine; Morphine and related; Toradol [ketorolac tromethamine]; Macrobid [nitrofurantoin monohyd macro]; and Sulfa antibiotics   Review of Systems Review of Systems  Constitutional: Negative for fever.  Gastrointestinal: Positive for abdominal pain. Negative for blood in stool, diarrhea, nausea and vomiting.  Genitourinary: Negative for dysuria and vaginal discharge.  Musculoskeletal: Positive for back pain.  Neurological: Positive for dizziness.  All other systems reviewed  and are negative.    Physical Exam Updated Vital Signs BP 151/88 (BP Location: Left Arm)   Pulse 72   Temp 98.5 F (36.9 C) (Oral)   Resp 18   Ht 5\' 2"  (1.575 m) Comment: Simultaneous filing. User may not have seen previous data.  Wt 177 lb (80.3 kg) Comment: Simultaneous filing. User may not have seen previous data.  SpO2 100%   BMI 32.37 kg/m   Physical Exam  Constitutional: She appears well-developed and well-nourished. No distress.  HENT:  Head: Normocephalic and atraumatic.  Eyes: Conjunctivae are normal.  Neck: Normal range of motion. Neck supple.  Cardiovascular: Normal rate, regular rhythm, normal heart sounds and intact distal pulses.   Pulmonary/Chest: Effort normal and breath sounds normal. No respiratory distress.  Abdominal: Soft. Normal appearance and bowel  sounds are normal. There is no tenderness. There is CVA tenderness. There is no guarding.  CVA tenderness on the right side.   Musculoskeletal: She exhibits no edema or tenderness.  Lymphadenopathy:    She has no cervical adenopathy.  Neurological: She is alert.  Skin: Skin is warm and dry. She is not diaphoretic.  Psychiatric: She has a normal mood and affect. Her behavior is normal.  Nursing note and vitals reviewed.    ED Treatments / Results  DIAGNOSTIC STUDIES: Oxygen Saturation is 100% on RA, normal by my interpretation.    COORDINATION OF CARE: 5:24 PM- Pt advised of plan for treatment, which includes labs, and pt agrees.  Labs (all labs ordered are listed, but only abnormal results are displayed) Labs Reviewed  URINALYSIS, ROUTINE W REFLEX MICROSCOPIC (NOT AT [facility]) - Abnormal; Notable for the following:       Result Value   Leukocytes, UA MODERATE (*)    All other components within normal limits  URINE MICROSCOPIC-ADD ON - Abnormal; Notable for the following:    Squamous Epithelial / LPF 6-30 (*)    Bacteria, UA FEW (*)    All other components within normal limits  PREGNANCY, URINE    COMPREHENSIVE METABOLIC PANEL  CBC WITH DIFFERENTIAL/PLATELET    EKG  EKG Interpretation None       Radiology Ct Abdomen Pelvis W Contrast  Result Date: 02/09/2016 CLINICAL DATA:  Cholecystectomy 4 weeks ago with right flank and lower quadrant pain now. EXAM: CT ABDOMEN AND PELVIS WITH CONTRAST TECHNIQUE: Multidetector CT imaging of the abdomen and pelvis was performed using the standard protocol following bolus administration of intravenous contrast. CONTRAST:  168mL ISOVUE-300 IOPAMIDOL (ISOVUE-300) INJECTION 61% COMPARISON:  CT scan 11/14/2015.  MRI abdomen from 12/30/2015. FINDINGS: Lower chest:  Unremarkable. Hepatobiliary: No focal abnormality within the liver parenchyma. 1.5 x 3.5 x 1.6 cm collection is identified in the gallbladder fossa. This shows no rim enhancement. There is no gas within the collection. No intra or extrahepatic biliary duct dilatation. Pancreas: No focal mass lesion. No dilatation of the main duct. No intraparenchymal cyst. No peripancreatic edema. Spleen: No splenomegaly. No focal mass lesion. Adrenals/Urinary Tract: No adrenal nodule or mass. 2-3 mm nonobstructing stone identified interpolar right kidney. Left kidney unremarkable. No evidence for hydroureter. No ureteral stones. The urinary bladder appears normal for the degree of distention. Stomach/Bowel: Stomach is nondistended. No gastric wall thickening. No evidence of outlet obstruction. Duodenum is normally positioned as is the ligament of Treitz. No small bowel wall thickening. No small bowel dilatation. The terminal ileum is normal. The appendix is normal. No gross colonic mass. No colonic wall thickening. No substantial diverticular change. Vascular/Lymphatic: No abdominal aortic aneurysm. No abdominal aortic atherosclerotic calcification. There is no gastrohepatic or hepatoduodenal ligament lymphadenopathy. No intraperitoneal or retroperitoneal lymphadenopathy. No pelvic sidewall lymphadenopathy.  Reproductive: The uterus has normal CT imaging appearance. There is no adnexal mass. Other: No intraperitoneal free fluid. Musculoskeletal: Bone windows reveal no worrisome lytic or sclerotic osseous lesions. Small umbilical hernia contains only fat. IMPRESSION: 1. 15 x 35 x 16 mm fluid collection in the gallbladder fossa, likely reflecting tiny postoperative fluid collection or hematoma. There is no rim enhancement or gas within the collection to suggest abscess. 2. 2-3 mm nonobstructing right renal stone. 3. Small umbilical hernia contains only fat. Electronically Signed   By: Misty Stanley M.D.   On: 02/09/2016 21:26    Procedures Procedures (including critical care time)  Medications Ordered in ED Medications  sodium chloride 0.9 % bolus 500 mL (0 mLs Intravenous Stopped 02/09/16 2132)  ibuprofen (ADVIL,MOTRIN) tablet 600 mg (600 mg Oral Given 02/09/16 1841)  iopamidol (ISOVUE-300) 61 % injection 100 mL (100 mLs Intravenous Contrast Given 02/09/16 2055)     Initial Impression / Assessment and Plan / ED Course  I have reviewed the triage vital signs and the nursing notes.  Pertinent labs & imaging results that were available during my care of the patient were reviewed by me and considered in my medical decision making (see chart for details).  Clinical Course    Romeka A Rementer presents with right lower back and right flank pain for the past week.  Patient's presentation is consistent with kidney stone, but a CT would be helpful because of the radiation of the patient's pain into the abdomen and her recent cholecystectomy. Patient is nontoxic appearing, afebrile, not tachycardic, not tachypneic, not hypotensive, and is in no apparent distress. Patient has no signs of sepsis or other serious or life-threatening condition. No significant abnormalities on CT. Patient to follow up with PCP or GI. The patient was given instructions for home care as well as return precautions. Patient voices  understanding of these instructions, accepts the plan, and is comfortable with discharge.  Vitals:   02/09/16 1704 02/09/16 1948 02/09/16 2206  BP: 151/88 154/78 136/87  Pulse: 72 74 63  Resp: 18 18 18   Temp: 98.5 F (36.9 C)    TempSrc: Oral Oral   SpO2: 100% 100% 100%  Weight: 80.3 kg    Height: 5\' 2"  (1.575 m)        Final Clinical Impressions(s) / ED Diagnoses   Final diagnoses:  Right-sided low back pain without sciatica    New Prescriptions Discharge Medication List as of 02/09/2016  9:54 PM     I personally performed the services described in this documentation, which was scribed in my presence. The recorded information has been reviewed and is accurate.    Lorayne Bender, PA-C 02/10/16 0057    Julianne Rice, MD 02/13/16 [phone]

## 2016-02-09 NOTE — ED Notes (Signed)
Pt states she is urinating more than usual and that her back pain radiaties toward the bladder area, and that it "is starting to feel like a kidney stone.

## 2016-02-09 NOTE — ED Triage Notes (Signed)
Back pain that radiates around to her abdomen. States she has a known kidney stone. She does not know if it could be moving.

## 2016-02-15 MED FILL — ?MONTELUKAST SOD 10 MG TAB: 10 | 30 days supply | Qty: 30 | Fill #0

## 2016-02-17 ENCOUNTER — Encounter: Payer: Self-pay | Admitting: *Deleted

## 2016-02-17 ENCOUNTER — Emergency Department
Admission: EM | Admit: 2016-02-17 | Discharge: 2016-02-17 | Disposition: A | Discharge status: Home / Self Care | Payer: Medicaid Other | Attending: Emergency Medicine | Admitting: Emergency Medicine

## 2016-02-17 ENCOUNTER — Emergency Department: Payer: Medicaid Other

## 2016-02-17 DIAGNOSIS — Z87891 Personal history of nicotine dependence: Secondary | ICD-10-CM | POA: Diagnosis not present

## 2016-02-17 DIAGNOSIS — R1011 Right upper quadrant pain: Secondary | ICD-10-CM | POA: Diagnosis present

## 2016-02-17 DIAGNOSIS — I10 Essential (primary) hypertension: Secondary | ICD-10-CM | POA: Insufficient documentation

## 2016-02-17 DIAGNOSIS — Z791 Long term (current) use of non-steroidal anti-inflammatories (NSAID): Secondary | ICD-10-CM | POA: Insufficient documentation

## 2016-02-17 DIAGNOSIS — R188 Other ascites: Secondary | ICD-10-CM | POA: Diagnosis not present

## 2016-02-17 DIAGNOSIS — N898 Other specified noninflammatory disorders of vagina: Secondary | ICD-10-CM | POA: Diagnosis not present

## 2016-02-17 DIAGNOSIS — Z79899 Other long term (current) drug therapy: Secondary | ICD-10-CM | POA: Diagnosis not present

## 2016-02-17 LAB — CBC
HCT: 39.9 % (ref 35.0–47.0)
Hemoglobin: 13.6 g/dL (ref 12.0–16.0)
MCH: 30.1 pg (ref 26.0–34.0)
MCHC: 34.1 g/dL (ref 32.0–36.0)
MCV: 88.1 fL (ref 80.0–100.0)
PLATELETS: 264 10*3/uL (ref 150–440)
RBC: 4.53 MIL/uL (ref 3.80–5.20)
RDW: 13.2 % (ref 11.5–14.5)
WBC: 6.7 10*3/uL (ref 3.6–11.0)

## 2016-02-17 LAB — URINALYSIS COMPLETE WITH MICROSCOPIC (ARMC ONLY)
BILIRUBIN URINE: NEGATIVE
Glucose, UA: NEGATIVE mg/dL
Hgb urine dipstick: NEGATIVE
KETONES UR: NEGATIVE mg/dL
LEUKOCYTES UA: NEGATIVE
Nitrite: NEGATIVE
PH: 6 (ref 5.0–8.0)
Protein, ur: NEGATIVE mg/dL
SPECIFIC GRAVITY, URINE: 1.001 — AB (ref 1.005–1.030)

## 2016-02-17 LAB — COMPREHENSIVE METABOLIC PANEL
ALK PHOS: 48 U/L (ref 38–126)
ALT: 25 U/L (ref 14–54)
AST: 25 U/L (ref 15–41)
Albumin: 4 g/dL (ref 3.5–5.0)
Anion gap: 9 (ref 5–15)
BILIRUBIN TOTAL: 0.3 mg/dL (ref 0.3–1.2)
BUN: 7 mg/dL (ref 6–20)
CALCIUM: 9.4 mg/dL (ref 8.9–10.3)
CHLORIDE: 105 mmol/L (ref 101–111)
CO2: 24 mmol/L (ref 22–32)
CREATININE: 0.66 mg/dL (ref 0.44–1.00)
Glucose, Bld: 135 mg/dL — ABNORMAL HIGH (ref 65–99)
Potassium: 3.2 mmol/L — ABNORMAL LOW (ref 3.5–5.1)
Sodium: 138 mmol/L (ref 135–145)
TOTAL PROTEIN: 7.3 g/dL (ref 6.5–8.1)

## 2016-02-17 LAB — PREGNANCY, URINE: Preg Test, Ur: NEGATIVE

## 2016-02-17 LAB — LIPASE, BLOOD: Lipase: 31 U/L (ref 11–51)

## 2016-02-17 MED ORDER — DIATRIZOATE MEGLUMINE & SODIUM 66-10 % PO SOLN
15.0000 mL | Freq: Once | ORAL | Status: AC
  Administered 2016-02-17: 15 mL via ORAL

## 2016-02-17 MED ORDER — IOPAMIDOL (ISOVUE-300) INJECTION 61%
100.0000 mL | Freq: Once | INTRAVENOUS | Status: AC | PRN
  Administered 2016-02-17: 100 mL via INTRAVENOUS
  Filled 2016-02-17: qty 100

## 2016-02-17 MED ORDER — NAPROXEN 500 MG PO TABS: 500.0000 mg | ORAL_TABLET | Freq: Two times a day (BID) | ORAL | 0 refills | Status: DC

## 2016-02-17 MED ORDER — ONDANSETRON 4 MG PO TBDP: 4.0000 mg | ORAL_TABLET | Freq: Three times a day (TID) | ORAL | 0 refills | Status: DC | PRN

## 2016-02-17 MED ORDER — SODIUM CHLORIDE 0.9 % IV BOLUS (SEPSIS)
1000.0000 mL | Freq: Once | INTRAVENOUS | Status: AC
  Administered 2016-02-17: 1000 mL via INTRAVENOUS

## 2016-02-17 NOTE — ED Provider Notes (Signed)
[facility] Emergency Department Provider Note  ____________________________________________  Time seen: Approximately 3:51 PM  I have reviewed the triage vital signs and the nursing notes.   HISTORY  Chief Complaint Abdominal Pain    HPI Christina Fox is a 35 y.o. female who complains of worsening abdominal painover the past 4 weeks ever since having a cholecystectomy laparoscopically at this hospital. She was seen at another hospital about a week ago due to worsening abdominal pain after the surgery. She had a CT scan which she reports showed some sort of hematoma in the abdomen at that time. Since then she has been having ongoing worsening abdominal pain. No nausea or vomiting but she is having chills. No dizziness chest pain shortness of breath or syncope. No diarrhea or constipation. She is eating okay. Pain occasionally radiates into the lower back. No aggravating or alleviating factors.     Past Medical History:  Diagnosis Date  . Anxiety   . Complication of anesthesia    states small mouth  . Deviated septum 09/2011  . GERD (gastroesophageal reflux disease)   . Headache(784.0)    sinus  . Hypertension    no meds  . Kidney stones    no current problem  . Migraine   . Nasal turbinate hypertrophy 09/2011   bilat.  . Pelvic inflammatory disease   . Urinary tract infection      Patient Active Problem List   Diagnosis Date Noted  . Gallstone 01/07/2016  . History of colonic polyps 11/27/2015  . Right lower quadrant abdominal pain 11/27/2015  . HTN (hypertension), benign 11/24/2015  . Abdominal pain 11/16/2015  . Headache 10/13/2015  . 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 Anxiety disorder 11/03/2014     Past Surgical History:  Procedure Laterality Date  . CHOLECYSTECTOMY N/A 01/15/2016   Procedure: LAPAROSCOPIC CHOLECYSTECTOMY WITH INTRAOPERATIVE CHOLANGIOGRAM;  Surgeon: Christene Lye, MD;  Location: [facility] ORS;  Service: General;   Laterality: N/A;  . COLONOSCOPY  2007   [facility]: pedunculated 32mm polyp in distal sigmoid, sessile 25mm polyp at splenic flexure, larger polyp tubulovillous adenoma and smaller polyp tubular adenoma   . COLONOSCOPY  09-09-15  . NASAL SEPTOPLASTY W/ TURBINOPLASTY  10/04/2011   Procedure: NASAL SEPTOPLASTY WITH TURBINATE REDUCTION;  Surgeon: Ascencion Dike, MD;  Location: [facility];  Service: ENT;  Laterality: Bilateral;  . Linda EXTRACTION  2009     Prior to Admission medications   Medication Sig Start Date End Date Taking? Authorizing Provider  benzonatate (TESSALON) 100 MG capsule Take 1-2 capsules (100-200 mg total) by mouth 3 (three) times daily as needed for cough. 01/22/16   Ree Shay McVey, PA-C  clonazePAM (KLONOPIN) 0.5 MG tablet Take 1 tablet (0.5 mg total) by mouth daily. Patient taking differently: Take 0.25 mg by mouth 2 (two) times daily.  10/20/15   Robyn Haber, MD  fexofenadine (ALLEGRA) 180 MG tablet Take 1 tablet (180 mg total) by mouth daily. Patient taking differently: Take 180 mg by mouth at bedtime.  12/22/15   Maren Reamer, MD  fluticasone (FLONASE) 50 MCG/ACT nasal spray Place 2 sprays into both nostrils daily. 12/22/15   Maren Reamer, MD  ibuprofen (ADVIL) 200 MG tablet Take 200 mg by mouth every 6 (six) hours as needed.    Historical Provider, MD  levonorgestrel-ethinyl estradiol (AUBRA) 0.1-20 MG-MCG tablet Take 1 tablet by mouth daily. Reported on 12/04/2015    Historical Provider, MD  montelukast (SINGULAIR) 10 MG tablet  TAKE 1 TABLET BY MOUTH DAILY 01/18/16   Maren Reamer, MD  Multiple Vitamins-Calcium (ONE-A-DAY WOMENS PO) Take 1 tablet by mouth every morning.    Historical Provider, MD  naproxen (NAPROSYN) 500 MG tablet Take 1 tablet (500 mg total) by mouth 2 (two) times daily with a meal. 02/17/16   Carrie Mew, MD  ondansetron (ZOFRAN ODT) 4 MG disintegrating tablet Take 1 tablet (4 mg total) by mouth every 8 (eight) hours as  needed for nausea or vomiting. 02/17/16   Carrie Mew, MD  oxyCODONE-acetaminophen (ROXICET) 5-325 MG tablet Take 1 tablet by mouth every 4 (four) hours as needed. Patient not taking: Reported on 01/28/2016 01/15/16   Seeplaputhur Robinette Haines, MD  pantoprazole (PROTONIX) 40 MG tablet Take 1 tablet (40 mg total) by mouth daily. 10/27/15   Maren Reamer, MD  potassium chloride SA (K-DUR,KLOR-CON) 20 MEQ tablet Take 20 mEq by mouth 2 (two) times a week.    Historical Provider, MD     Allergies Bentyl [dicyclomine hcl]; Haldol [haloperidol]; Meclizine; Morphine and related; Toradol [ketorolac tromethamine]; Macrobid [nitrofurantoin monohyd macro]; and Sulfa antibiotics   Family History  Problem Relation Age of Onset  . Kidney cancer Mother   . Hypertension Mother   . Migraines Mother   . Multiple sclerosis Mother   . Fibromyalgia Mother   . Liver disease Mother     NASH   . Hypertension Father   . Liver disease Father     liver transplant; had fatty liver disease  . Spina bifida Brother   . Migraines Maternal Grandmother   . Hypertension Other   . Colon cancer Paternal Grandfather     Social History Social History  Substance Use Topics  . Smoking status: Former Smoker    Packs/day: 0.25    Years: 10.00    Quit date: 08/23/2008  . Smokeless tobacco: Never Used  . Alcohol use No    Review of Systems  Constitutional:   No fever Positive chills.  ENT:   No sore throat. No rhinorrhea. Cardiovascular:   No chest pain. Respiratory:   No dyspnea or cough. Gastrointestinal:   Positive right-sided abdominal pain. No vomiting or diarrhea.  Genitourinary:   Negative for dysuria or difficulty urinating. Minimal scant amount of vaginal discharge without pain itching or other symptoms. Patient is not concerned about this discharge. Musculoskeletal:   Negative for focal pain or swelling 10-point ROS otherwise negative.  ____________________________________________   PHYSICAL  EXAM:  VITAL SIGNS: ED Triage Vitals [02/17/16 1435]  Enc Vitals Group     BP (!) 160/89     Pulse Rate (!) 103     Resp 18     Temp 98.2 F (36.8 C)     Temp Source Oral     SpO2 99 %     Weight 178 lb (80.7 kg)     Height 5\' 2"  (1.575 m)     Head Circumference      Peak Flow      Pain Score 5     Pain Loc      Pain Edu?      Excl. in [place]?     Vital signs reviewed, nursing assessments reviewed.   Constitutional:   Alert and oriented. Well appearing and in no distress. Eyes:   No scleral icterus. No conjunctival pallor. PERRL. EOMI.  No nystagmus. ENT   Head:   Normocephalic and atraumatic.   Nose:   No congestion/rhinnorhea. No septal hematoma   Mouth/Throat:  MMM, no pharyngeal erythema. No peritonsillar mass.    Neck:   No stridor. No SubQ emphysema. No meningismus. Hematological/Lymphatic/Immunilogical:   No cervical lymphadenopathy. Cardiovascular:   RRR. Symmetric bilateral radial and DP pulses.  No murmurs.  Respiratory:   Normal respiratory effort without tachypnea nor retractions. Breath sounds are clear and equal bilaterally. No wheezes/rales/rhonchi. Gastrointestinal:   Soft With diffuse tenderness particularly in the right side of the abdomen diffusely. Non distended. There is no CVA tenderness.  No rebound, rigidity, or guarding. Genitourinary:   deferred Musculoskeletal:   Nontender with normal range of motion in all extremities. No joint effusions.  No lower extremity tenderness.  No edema. Neurologic:   Normal speech and language.  CN 2-10 normal. Motor grossly intact. No gross focal neurologic deficits are appreciated.  Skin:    Skin is warm, dry and intact. No rash noted.  No petechiae, purpura, or bullae.  ____________________________________________    LABS (pertinent positives/negatives) (all labs ordered are listed, but only abnormal results are displayed) Labs Reviewed  COMPREHENSIVE METABOLIC PANEL - Abnormal; Notable for the  following:       Result Value   Potassium 3.2 (*)    Glucose, Bld 135 (*)    All other components within normal limits  URINALYSIS COMPLETEWITH MICROSCOPIC ([facility] ONLY) - Abnormal; Notable for the following:    Color, Urine COLORLESS (*)    APPearance CLEAR (*)    Specific Gravity, Urine 1.001 (*)    Bacteria, UA RARE (*)    Squamous Epithelial / LPF 0-5 (*)    All other components within normal limits  LIPASE, BLOOD  CBC  PREGNANCY, URINE   ____________________________________________   EKG    ____________________________________________    RADIOLOGY  CT reveals abdominal fluid collection, no evidence of infection. Not consistent with abscess. Likely seroma  ____________________________________________   PROCEDURES Procedures  ____________________________________________   INITIAL IMPRESSION / ASSESSMENT AND PLAN / ED COURSE  Pertinent labs & imaging results that were available during my care of the patient were reviewed by me and considered in my medical decision making (see chart for details).  Patient presents with ongoing worsening abdominal pain and chills after recent abdominal surgery. Concern for her surgical complication which could be developing into enlarging hematoma or abscess. We'll get repeat CT scan today. Check labs.     Clinical Course    ----------------------------------------- 6:47 PM on 02/17/2016 -----------------------------------------  Symptoms remain controlled despite not taking any medications in the ED. Patient requests prescription for naproxen as this has worked well for her in the past. Results discussed with her. She has an appointment in 1 week to follow up with surgery. Return precautions given for any signs of infection or worsening symptoms. Work note provided.Considering the patient's symptoms, medical history, and physical examination today, I have low suspicion for cholecystitis or biliary pathology, pancreatitis,  perforation or bowel obstruction, hernia, intra-abdominal abscess, AAA or dissection, volvulus or intussusception, mesenteric ischemia, or appendicitis.   ____________________________________________   FINAL CLINICAL IMPRESSION(S) / ED DIAGNOSES  Final diagnoses:  Right upper quadrant pain  Abdominal fluid collection       Portions of this note were generated with dragon dictation software. Dictation errors may occur despite best attempts at proofreading.    Carrie Mew, MD 02/17/16 [phone]

## 2016-02-17 NOTE — Discharge Instructions (Signed)
EXAM:  CT ABDOMEN AND PELVIS WITH CONTRAST     TECHNIQUE:  Multidetector CT imaging of the abdomen and pelvis was performed  using the standard protocol following bolus administration of  intravenous contrast.     CONTRAST:  140mL ISOVUE-300 IOPAMIDOL (ISOVUE-300) INJECTION 61%     COMPARISON:  CT abdomen and pelvis February 09, 2016     FINDINGS:  LUNG BASES: Included view of the lung bases are clear. Visualized  heart and pericardium are unremarkable.     SOLID ORGANS: 12 x 16 mm residual fluid collection within  gallbladder fossa without rim enhancement or associated fat  stranding. The liver, spleen, pancreas and adrenal glands are  unremarkable.     GASTROINTESTINAL TRACT: The stomach, small and large bowel are  normal in course and caliber without inflammatory changes. Normal  appendix.     KIDNEYS/ URINARY TRACT: Kidneys are orthotopic, demonstrating  symmetric enhancement. 3 mm RIGHT interpolar nephrolithiasis. No  hydronephrosis or solid renal masses. The unopacified ureters are  normal in course and caliber. Delayed imaging through the kidneys  demonstrates symmetric prompt contrast excretion within the proximal  urinary collecting system. Urinary bladder is well distended and  unremarkable.     PERITONEUM/RETROPERITONEUM: Aortoiliac vessels are normal in course  and caliber. No lymphadenopathy by CT size criteria. Internal  reproductive organs are unremarkable. No intraperitoneal free fluid  nor free air.     SOFT TISSUE/OSSEOUS STRUCTURES: Non-suspicious. Small fat containing  umbilical hernia.     IMPRESSION:  No acute intra-abdominal or pelvic process.     12 x 16 mm residual hematoma or seroma within the gallbladder fossa,  status post cholecystectomy.     3 mm nonobstructing RIGHT nephrolithiasis.  Normal appendix.

## 2016-02-17 NOTE — ED Notes (Signed)
Patient transported to CT 

## 2016-02-17 NOTE — ED Triage Notes (Signed)
States she had her gallbladder removed last week, was seen last week and told she had a fluid collection where her gallbladder was, states [facility] and was called today and told she could have a kidney infection, states pain on her right upper abd area, pt awake and alert upon arrival in no distress

## 2016-02-18 LAB — POCT PREGNANCY, URINE: Preg Test, Ur: NEGATIVE

## 2016-02-23 ENCOUNTER — Ambulatory Visit (INDEPENDENT_AMBULATORY_CARE_PROVIDER_SITE_OTHER): Payer: No Typology Code available for payment source | Admitting: Licensed Clinical Social Worker

## 2016-02-23 ENCOUNTER — Encounter (HOSPITAL_COMMUNITY): Payer: Self-pay | Admitting: Licensed Clinical Social Worker

## 2016-02-23 DIAGNOSIS — F411 Generalized anxiety disorder: Secondary | ICD-10-CM

## 2016-02-23 DIAGNOSIS — F321 Major depressive disorder, single episode, moderate: Secondary | ICD-10-CM
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
## 2016-02-26 ENCOUNTER — Other Ambulatory Visit: Payer: Self-pay | Admitting: Gastroenterology

## 2016-02-26 ENCOUNTER — Encounter (HOSPITAL_COMMUNITY): Payer: Self-pay | Admitting: *Deleted

## 2016-02-26 MED FILL — ?PANTOPRAZOLE SOD DR 40MG: 40 MG | 30 days supply | Qty: 30 | Fill #1

## 2016-02-26 NOTE — Progress Notes (Signed)
02-26-16 1525 Spoke with Denelle of Dr. Durenda Age office after speaking with pt. She has many concerns and questions regarding procedure. Denelle states she has talked with pt extensively several times before, but she would return a call to pt to address pt concerns.

## 2016-03-01 ENCOUNTER — Encounter (HOSPITAL_COMMUNITY)
Admission: RE | Disposition: A | Discharge status: Home / Self Care | Payer: Self-pay | Source: Ambulatory Visit | Attending: Gastroenterology

## 2016-03-01 ENCOUNTER — Ambulatory Visit (HOSPITAL_COMMUNITY)
Admission: RE | Admit: 2016-03-01 | Discharge: 2016-03-01 | Disposition: A | Discharge status: Home / Self Care | Payer: Medicaid Other | Source: Ambulatory Visit | Attending: Gastroenterology | Admitting: Gastroenterology

## 2016-03-01 ENCOUNTER — Encounter (HOSPITAL_COMMUNITY): Payer: Self-pay | Admitting: Anesthesiology

## 2016-03-01 ENCOUNTER — Ambulatory Visit (HOSPITAL_COMMUNITY): Payer: Medicaid Other | Admitting: Anesthesiology

## 2016-03-01 DIAGNOSIS — Z86010 Personal history of colonic polyps: Secondary | ICD-10-CM | POA: Insufficient documentation

## 2016-03-01 DIAGNOSIS — Z87891 Personal history of nicotine dependence: Secondary | ICD-10-CM | POA: Diagnosis not present

## 2016-03-01 DIAGNOSIS — Z793 Long term (current) use of hormonal contraceptives: Secondary | ICD-10-CM | POA: Insufficient documentation

## 2016-03-01 DIAGNOSIS — Z1211 Encounter for screening for malignant neoplasm of colon: Secondary | ICD-10-CM | POA: Insufficient documentation

## 2016-03-01 DIAGNOSIS — Z79891 Long term (current) use of opiate analgesic: Secondary | ICD-10-CM | POA: Insufficient documentation

## 2016-03-01 DIAGNOSIS — K219 Gastro-esophageal reflux disease without esophagitis: Secondary | ICD-10-CM | POA: Insufficient documentation

## 2016-03-01 DIAGNOSIS — D125 Benign neoplasm of sigmoid colon: Secondary | ICD-10-CM | POA: Insufficient documentation

## 2016-03-01 DIAGNOSIS — Z791 Long term (current) use of non-steroidal anti-inflammatories (NSAID): Secondary | ICD-10-CM | POA: Insufficient documentation

## 2016-03-01 DIAGNOSIS — Z7951 Long term (current) use of inhaled steroids: Secondary | ICD-10-CM | POA: Diagnosis not present

## 2016-03-01 DIAGNOSIS — Z13810 Encounter for screening for upper gastrointestinal disorder: Secondary | ICD-10-CM | POA: Diagnosis not present

## 2016-03-01 DIAGNOSIS — Z79899 Other long term (current) drug therapy: Secondary | ICD-10-CM | POA: Diagnosis not present

## 2016-03-01 DIAGNOSIS — I10 Essential (primary) hypertension: Secondary | ICD-10-CM | POA: Diagnosis not present

## 2016-03-01 HISTORY — PX: COLONOSCOPY WITH PROPOFOL: SHX5780

## 2016-03-01 HISTORY — DX: Unspecified abdominal pain: R10.9

## 2016-03-01 SURGERY — COLONOSCOPY WITH PROPOFOL
Anesthesia: Monitor Anesthesia Care

## 2016-03-01 MED ORDER — LACTATED RINGERS IV SOLN
INTRAVENOUS | Status: DC
  Administered 2016-03-01: 1000 mL via INTRAVENOUS

## 2016-03-01 MED ORDER — PROPOFOL 10 MG/ML IV BOLUS
INTRAVENOUS | Status: AC
  Filled 2016-03-01: qty 40

## 2016-03-01 MED ORDER — PROPOFOL 500 MG/50ML IV EMUL
INTRAVENOUS | Status: DC | PRN
  Administered 2016-03-01: 140 ug/kg/min via INTRAVENOUS

## 2016-03-01 MED ORDER — MEPERIDINE HCL 100 MG/ML IJ SOLN: 6.2500 mg | INTRAMUSCULAR | Status: DC | PRN

## 2016-03-01 MED ORDER — LIDOCAINE 2% (20 MG/ML) 5 ML SYRINGE
INTRAMUSCULAR | Status: DC | PRN
  Administered 2016-03-01: 100 mg via INTRAVENOUS

## 2016-03-01 MED ORDER — LIDOCAINE 2% (20 MG/ML) 5 ML SYRINGE
INTRAMUSCULAR | Status: AC
  Filled 2016-03-01: qty 5

## 2016-03-01 MED ORDER — MIDAZOLAM HCL 5 MG/5ML IJ SOLN
INTRAMUSCULAR | Status: DC | PRN
  Administered 2016-03-01: 2 mg via INTRAVENOUS

## 2016-03-01 MED ORDER — HYDROMORPHONE HCL 1 MG/ML IJ SOLN: 0.2500 mg | INTRAMUSCULAR | Status: DC | PRN

## 2016-03-01 MED ORDER — ONDANSETRON HCL 4 MG/2ML IJ SOLN: 4.0000 mg | Freq: Once | INTRAMUSCULAR | Status: DC | PRN

## 2016-03-01 MED ORDER — MIDAZOLAM HCL 2 MG/2ML IJ SOLN
INTRAMUSCULAR | Status: AC
  Filled 2016-03-01: qty 2

## 2016-03-01 MED ORDER — PROPOFOL 10 MG/ML IV BOLUS
INTRAVENOUS | Status: AC
  Filled 2016-03-01: qty 20

## 2016-03-01 MED ORDER — PROPOFOL 10 MG/ML IV BOLUS
INTRAVENOUS | Status: DC | PRN
  Administered 2016-03-01 (×5): 20 mg via INTRAVENOUS
  Administered 2016-03-01: 40 mg via INTRAVENOUS

## 2016-03-01 SURGICAL SUPPLY — 24 items

## 2016-03-01 NOTE — Anesthesia Preprocedure Evaluation (Signed)
Anesthesia Evaluation  Patient identified by MRN, date of birth, ID band Patient awake    Reviewed: Allergy & Precautions, NPO status , Patient's Chart, lab work & pertinent test results  Airway Mallampati: I  TM Distance: >3 FB Neck ROM: Full    Dental   Pulmonary former smoker,    Pulmonary exam normal        Cardiovascular hypertension, Pt. on medications Normal cardiovascular exam     Neuro/Psych Anxiety    GI/Hepatic GERD  Medicated and Controlled,  Endo/Other    Renal/GU      Musculoskeletal   Abdominal   Peds  Hematology   Anesthesia Other Findings   Reproductive/Obstetrics                             Anesthesia Physical Anesthesia Plan  ASA: II  Anesthesia Plan: MAC   Post-op Pain Management:    Induction: Intravenous  Airway Management Planned: Natural Airway  Additional Equipment:   Intra-op Plan:   Post-operative Plan:   Informed Consent: I have reviewed the patients History and Physical, chart, labs and discussed the procedure including the risks, benefits and alternatives for the proposed anesthesia with the patient or authorized representative who has indicated his/her understanding and acceptance.     Plan Discussed with: CRNA and Surgeon  Anesthesia Plan Comments:         Anesthesia Quick Evaluation

## 2016-03-01 NOTE — Anesthesia Postprocedure Evaluation (Signed)
Anesthesia Post Note  Patient: Christina Fox  Procedure(s) Performed: Procedure(s) (LRB): COLONOSCOPY WITH PROPOFOL (N/A)  Patient location during evaluation: PACU Anesthesia Type: MAC Level of consciousness: awake and alert Pain management: pain level controlled Vital Signs Assessment: post-procedure vital signs reviewed and stable Respiratory status: spontaneous breathing, nonlabored ventilation, respiratory function stable and patient connected to nasal cannula oxygen Cardiovascular status: stable and blood pressure returned to baseline Anesthetic complications: no    Last Vitals:  Vitals:   03/01/16 1150 03/01/16 1200  BP: (!) 160/75 (!) 161/93  Pulse: 73 (!) 49  Resp: 15 (!) 23  Temp:      Last Pain:  Vitals:   03/01/16 1044  TempSrc: Oral  PainSc: 5                  [name] DAVID

## 2016-03-01 NOTE — Transfer of Care (Signed)
Immediate Anesthesia Transfer of Care Note  Patient: Christina Fox  Procedure(s) Performed: Procedure(s): COLONOSCOPY WITH PROPOFOL (N/A)  Patient Location: Endoscopy Unit  Anesthesia Type:MAC  Level of Consciousness: awake  Airway & Oxygen Therapy: Patient Spontanous Breathing and Patient connected to face mask oxygen  Post-op Assessment: Report given to RN and Post -op Vital signs reviewed and stable  Post vital signs: Reviewed and stable  Last Vitals:  Vitals:   03/01/16 1044  BP: (!) 157/89  Pulse: 75  Resp: 11  Temp: 36.8 C    Last Pain:  Vitals:   03/01/16 1044  TempSrc: Oral  PainSc: 5          Complications: No apparent anesthesia complications

## 2016-03-01 NOTE — Discharge Instructions (Signed)
YOU HAD AN ENDOSCOPIC PROCEDURE TODAY: Refer to the procedure report and other information in the discharge instructions given to you for any specific questions about what was found during the examination. If this information does not answer your questions, please call Dr. Johnson office at [phone] to clarify.  ° °YOU SHOULD EXPECT: Some feelings of bloating in the abdomen. Passage of more gas than usual. Walking can help get rid of the air that was put into your GI tract during the procedure and reduce the bloating. If you had a lower endoscopy (such as a colonoscopy or flexible sigmoidoscopy) you may notice spotting of blood in your stool or on the toilet paper. Some abdominal soreness may be present for a day or two, also. ° °DIET: Your first meal following the procedure should be a light meal and then it is ok to progress to your normal diet. A half-sandwich or bowl of soup is an example of a good first meal. Heavy or fried foods are harder to digest and may make you feel nauseous or bloated. Drink plenty of fluids but you should avoid alcoholic beverages for 24 hours. If you had a esophageal dilation, please see attached instructions for diet.  ° °ACTIVITY: Your care partner should take you home directly after the procedure. You should plan to take it easy, moving slowly for the rest of the day. You can resume normal activity the day after the procedure however YOU SHOULD NOT DRIVE, use power tools, machinery or perform tasks that involve climbing or major physical exertion for 24 hours (because of the sedation medicines used during the test).  ° °SYMPTOMS TO REPORT IMMEDIATELY: °A gastroenterologist can be reached at any hour. Please call [phone]  for any of the following symptoms:  °Following lower endoscopy (colonoscopy, flexible sigmoidoscopy) °Excessive amounts of blood in the stool  °Significant tenderness, worsening of abdominal pains  °Swelling of the abdomen that is new, acute  °Fever of 100°  or higher  °Following upper endoscopy (EGD, EUS, ERCP, esophageal dilation) °Vomiting of blood or coffee ground material  °New, significant abdominal pain  °New, significant chest pain or pain under the shoulder blades  °Painful or persistently difficult swallowing  °New shortness of breath  °Black, tarry-looking or red, bloody stools ° °FOLLOW UP:  °If any biopsies were taken you will be contacted by phone or by letter within the next 1-3 weeks. Call [phone]  if you have not heard about the biopsies in 3 weeks.  °Please also call with any specific questions about appointments or follow up tests. ° ° °

## 2016-03-01 NOTE — H&P (Signed)
  Procedures: Surveillance colonoscopy and diagnostic esophagogastroduodenoscopy. 09/08/2005 colonoscopy was performed with removal of a 15 mm pedunculated tubulovillous adenomatous polyp from the distal sigmoid colon. Chronic gastroesophageal reflux associated with intermittent heartburn and intermittent esophageal dysphagia. Recent laparoscopic cholecystectomy to treat gallstones and suspected choledocholithiasis. Elevated liver transaminases preoperatively completely normalized post laparoscopic cholecystectomy. Father required liver transplantation to treat nonalcoholic steatohepatitis leading to cirrhosis.  History: The patient is a 35 year old female born 12/04/1980. In March 2007 she underwent a colonoscopy with removal of a 15 mm pedunculated distal sigmoid colon tubular adenomatous polyp. She is scheduled to undergo a surveillance colonoscopy today.  She has a history of chronic gastroesophageal reflux associated with heartburn and intermittent esophageal dysphagia. She is scheduled to undergo diagnostic esophagogastroduodenoscopy with screen for eosinophilic esophagitis.  Past medical history: Chronic anxiety. Kidney stone. Laparoscopic cholecystectomy to treat symptomatic gallstone with suspected choledocholithiasis.  Medication allergies: Sulfa and morphine  Exam: The patient is alert and lying comfortably on the endoscopy stretcher. Abdomen is soft and nontender to palpation. Lungs are clear to auscultation. Cardiac exam reveals a regular rhythm.  Plan: Proceed with surveillance colonoscopy and diagnostic esophagogastroduodenoscopy with screen for Barrett's esophagus and eosinophilic esophagitis

## 2016-03-01 NOTE — Progress Notes (Signed)
Pt with sinus arrhythmia, rate 50s.  BP 160s/70s-90s.  Pt without complaint.  Dr. Conrad Rutledge notified, at bedside to assess patient.  Ok to proceed with discharge.  Vista Lawman, RN

## 2016-03-01 NOTE — Op Note (Signed)
[facility] Patient Name: Christina Fox Procedure Date: 03/01/2016 MRN: [id number] Attending MD: Garlan Fair , MD Date of Birth: 1980/09/10 CSN: [id number] Age: 35 Admit Type: Outpatient Procedure:                Colonoscopy Indications:              High risk colon cancer surveillance: Personal                            history of adenoma with villous component Providers:                Garlan Fair, MD, Tory Emerald, RN, Despina Pole Tech, Technician, Danley Danker, CRNA Referring MD:              Medicines:                Propofol per Anesthesia Complications:            No immediate complications. Estimated Blood Loss:     Estimated blood loss: none. Procedure:                Pre-Anesthesia Assessment:                           - Prior to the procedure, a History and Physical                            was performed, and patient medications and                            allergies were reviewed. The patient's tolerance of                            previous anesthesia was also reviewed. The risks                            and benefits of the procedure and the sedation                            options and risks were discussed with the patient.                            All questions were answered, and informed consent                            was obtained. Prior Anticoagulants: The patient has                            taken no previous anticoagulant or antiplatelet                            agents. ASA Grade Assessment: II - A patient with  mild systemic disease. After reviewing the risks                            and benefits, the patient was deemed in                            satisfactory condition to undergo the procedure.                           After obtaining informed consent, the colonoscope                            was passed under direct vision. Throughout the             procedure, the patient's blood pressure, pulse, and                            oxygen saturations were monitored continuously. The                            [id number] scope was introduced through                            the anus and advanced to the the cecum, identified                            by appendiceal orifice and ileocecal valve. The                            colonoscopy was performed without difficulty. The                            patient tolerated the procedure well. The quality                            of the bowel preparation was good. The appendiceal                            orifice and the rectum were photographed. Scope In: 11:03:59 AM Scope Out: 11:29:43 AM Scope Withdrawal Time: 0 hours 13 minutes 38 seconds  Total Procedure Duration: 0 hours 25 minutes 44 seconds  Findings:      The perianal and digital rectal examinations were normal.      A 6 mm polyp was found in the proximal sigmoid colon. The polyp was       pedunculated. The polyp was removed with a hot snare. Resection and       retrieval were complete.      The exam was otherwise without abnormality. Impression:               - One 6 mm polyp in the proximal sigmoid colon,                            removed with a hot snare. Resected and retrieved.  An endoclip was applied to the polypectomy sie to                            prevent bleeding.                           - The examination was otherwise normal. Moderate Sedation:      N/A- Per Anesthesia Care Recommendation:           - Patient has a contact number available for                            emergencies. The signs and symptoms of potential                            delayed complications were discussed with the                            patient. Return to normal activities tomorrow.                            Written discharge instructions were provided to the                             patient.                           - Repeat colonoscopy in 5 years for surveillance.                           - Resume previous diet.                           - Continue present medications. Procedure Code(s):        --- Professional ---                           [phone], Colonoscopy, flexible; with removal of                            tumor(s), polyp(s), or other lesion(s) by snare                            technique Diagnosis Code(s):        --- Professional ---                           Z86.010, Personal history of colonic polyps                           D12.5, Benign neoplasm of sigmoid colon CPT copyright 2016 American Medical Association. All rights reserved. The codes documented in this report are preliminary and upon coder review may  be revised to meet current compliance requirements. Earle Gell, MD Garlan Fair, MD 03/01/2016 11:38:25 AM This report has been signed electronically. Number of Addenda: 0

## 2016-03-02 ENCOUNTER — Encounter (HOSPITAL_COMMUNITY): Payer: Self-pay | Admitting: Gastroenterology

## 2016-03-02 ENCOUNTER — Other Ambulatory Visit: Payer: Self-pay | Admitting: Obstetrics

## 2016-03-05 ENCOUNTER — Inpatient Hospital Stay (HOSPITAL_COMMUNITY)
Admission: AD | Admit: 2016-03-05 | Discharge: 2016-03-05 | Disposition: A | Discharge status: Home / Self Care | Payer: Medicaid Other | Source: Ambulatory Visit | Attending: Obstetrics and Gynecology | Admitting: Obstetrics and Gynecology

## 2016-03-05 DIAGNOSIS — Z881 Allergy status to other antibiotic agents status: Secondary | ICD-10-CM | POA: Diagnosis not present

## 2016-03-05 DIAGNOSIS — Z833 Family history of diabetes mellitus: Secondary | ICD-10-CM | POA: Insufficient documentation

## 2016-03-05 DIAGNOSIS — K219 Gastro-esophageal reflux disease without esophagitis: Secondary | ICD-10-CM | POA: Diagnosis not present

## 2016-03-05 DIAGNOSIS — Z8051 Family history of malignant neoplasm of kidney: Secondary | ICD-10-CM | POA: Diagnosis not present

## 2016-03-05 DIAGNOSIS — N739 Female pelvic inflammatory disease, unspecified: Secondary | ICD-10-CM | POA: Diagnosis not present

## 2016-03-05 DIAGNOSIS — Z800 Family history of malignant neoplasm of digestive organs: Secondary | ICD-10-CM | POA: Diagnosis not present

## 2016-03-05 DIAGNOSIS — Z87442 Personal history of urinary calculi: Secondary | ICD-10-CM | POA: Insufficient documentation

## 2016-03-05 DIAGNOSIS — G43909 Migraine, unspecified, not intractable, without status migrainosus: Secondary | ICD-10-CM | POA: Insufficient documentation

## 2016-03-05 DIAGNOSIS — Z888 Allergy status to other drugs, medicaments and biological substances status: Secondary | ICD-10-CM | POA: Insufficient documentation

## 2016-03-05 DIAGNOSIS — Z8249 Family history of ischemic heart disease and other diseases of the circulatory system: Secondary | ICD-10-CM | POA: Diagnosis not present

## 2016-03-05 DIAGNOSIS — I10 Essential (primary) hypertension: Secondary | ICD-10-CM | POA: Diagnosis not present

## 2016-03-05 DIAGNOSIS — J343 Hypertrophy of nasal turbinates: Secondary | ICD-10-CM | POA: Insufficient documentation

## 2016-03-05 DIAGNOSIS — Z9049 Acquired absence of other specified parts of digestive tract: Secondary | ICD-10-CM | POA: Insufficient documentation

## 2016-03-05 DIAGNOSIS — Z87891 Personal history of nicotine dependence: Secondary | ICD-10-CM | POA: Diagnosis not present

## 2016-03-05 DIAGNOSIS — Z882 Allergy status to sulfonamides status: Secondary | ICD-10-CM | POA: Insufficient documentation

## 2016-03-05 DIAGNOSIS — N390 Urinary tract infection, site not specified: Secondary | ICD-10-CM | POA: Diagnosis not present

## 2016-03-05 DIAGNOSIS — Z885 Allergy status to narcotic agent status: Secondary | ICD-10-CM | POA: Insufficient documentation

## 2016-03-05 DIAGNOSIS — R350 Frequency of micturition: Secondary | ICD-10-CM | POA: Diagnosis present

## 2016-03-05 DIAGNOSIS — Z87440 Personal history of urinary (tract) infections: Secondary | ICD-10-CM | POA: Insufficient documentation

## 2016-03-05 DIAGNOSIS — R319 Hematuria, unspecified: Secondary | ICD-10-CM

## 2016-03-05 LAB — URINE MICROSCOPIC-ADD ON

## 2016-03-05 LAB — URINALYSIS, ROUTINE W REFLEX MICROSCOPIC
Bilirubin Urine: NEGATIVE
GLUCOSE, UA: NEGATIVE mg/dL
Ketones, ur: NEGATIVE mg/dL
Nitrite: NEGATIVE
PH: 5.5 (ref 5.0–8.0)
PROTEIN: NEGATIVE mg/dL
Specific Gravity, Urine: <1.005 — ABNORMAL LOW (ref 1.005–1.030)

## 2016-03-05 LAB — POCT PREGNANCY, URINE: Preg Test, Ur: NEGATIVE

## 2016-03-05 MED ORDER — CIPROFLOXACIN HCL 500 MG PO TABS: 500.0000 mg | ORAL_TABLET | Freq: Two times a day (BID) | ORAL | 0 refills | Status: DC

## 2016-03-05 MED ORDER — IBUPROFEN 800 MG PO TABS
800.0000 mg | ORAL_TABLET | Freq: Once | ORAL | Status: AC
  Administered 2016-03-05: 800 mg via ORAL
  Filled 2016-03-05: qty 1

## 2016-03-05 MED ORDER — PHENAZOPYRIDINE HCL 100 MG PO TABS
200.0000 mg | ORAL_TABLET | Freq: Once | ORAL | Status: AC
  Administered 2016-03-05: 200 mg via ORAL
  Filled 2016-03-05: qty 2

## 2016-03-05 MED ORDER — PHENAZOPYRIDINE HCL 200 MG PO TABS: 200.0000 mg | ORAL_TABLET | Freq: Three times a day (TID) | ORAL | 0 refills | Status: DC

## 2016-03-05 MED ORDER — IBUPROFEN 800 MG PO TABS: 800.0000 mg | ORAL_TABLET | Freq: Three times a day (TID) | ORAL | 0 refills | Status: DC

## 2016-03-05 MED ORDER — CIPROFLOXACIN HCL 500 MG PO TABS
500.0000 mg | ORAL_TABLET | Freq: Two times a day (BID) | ORAL | Status: DC
  Administered 2016-03-05: 500 mg via ORAL
  Filled 2016-03-05 (×2): qty 1

## 2016-03-05 NOTE — Discharge Instructions (Signed)
Asymptomatic Bacteriuria, Female Asymptomatic bacteriuria is the presence of a large number of bacteria in your urine without the usual symptoms of burning or frequent urination. The following conditions increase the risk of asymptomatic bacteriuria:  Diabetes mellitus.  Advanced age.  Pregnancy in the first trimester.  Kidney stones.  Kidney transplants.  Leaky kidney tube valve in young children (reflux). Treatment for this condition is not needed in most people and can lead to other problems such as too much yeast and growth of resistant bacteria. However, some people, such as pregnant women, do need treatment to prevent kidney infection. Asymptomatic bacteriuria in pregnancy is also associated with fetal growth restriction, premature labor, and newborn death. HOME CARE INSTRUCTIONS Monitor your condition for any changes. The following actions may help to relieve any discomfort you are feeling:  Drink enough water and fluids to keep your urine clear or pale yellow. Go to the bathroom more often to keep your bladder empty.  Keep the area around your vagina and rectum clean. Wipe yourself from front to back after urinating. SEEK IMMEDIATE MEDICAL CARE IF:  You develop signs of an infection such as:  Burning with urination.  Frequency of voiding.  Back pain.  Fever.  You have blood in the urine.  You develop a fever. MAKE SURE YOU:  Understand these instructions.  Will watch your condition.  Will get help right away if you are not doing well or get worse.   This information is not intended to replace advice given to you by your health care provider. Make sure you discuss any questions you have with your health care provider.   Document Released: 06/13/2005 Document Revised: 07/04/2014 Document Reviewed: 12/03/2012 Elsevier Interactive Patient Education 2016 Elsevier Inc.  

## 2016-03-05 NOTE — MAU Note (Addendum)
Had gallbladder removed 7/21. Since then having lower back pains and radiates to RLQ. Blood in urine when I pee. Saw doc yesterday and urine sent for culture. Having R flank pain. Have had 2 CTs since surgery and just showed some fld on R side from where gallbladder was removed. Hx uti and feels like that. Had colonoscopy Tues and everything ok. Hx kidney stone R that is nonobstructive

## 2016-03-05 NOTE — MAU Provider Note (Signed)
History   35 yo wf in with c/o urinary frequency, pain and burning upon urination. States urine is blooding when she empties her bladder. Saw primary care yesterday had hematuria but was not treated. Also c/o right flank pain. Pt has sm renal calculi on CT that was done on 02/17/16.  CSN: [id number]  Arrival date & time 03/05/16  1857   None     No chief complaint on file.   HPI  Past Medical History:  Diagnosis Date  . Abdominal pain    "right lower quadrant pain and right lower back pain  . Anxiety   . Complication of anesthesia    states small mouth  . Deviated septum 09/2011  . GERD (gastroesophageal reflux disease)   . Headache(784.0)    sinus  . Hypertension    no meds  . Kidney stones    no current problem  . Migraine   . Nasal turbinate hypertrophy 09/2011   bilat.  . Pelvic inflammatory disease   . Urinary tract infection     Past Surgical History:  Procedure Laterality Date  . CHOLECYSTECTOMY N/A 01/15/2016   Procedure: LAPAROSCOPIC CHOLECYSTECTOMY WITH INTRAOPERATIVE CHOLANGIOGRAM;  Surgeon: Christene Lye, MD;  Location: [facility];  Service: General;  Laterality: N/A;  . COLONOSCOPY  2007   [facility]: pedunculated 58mm polyp in distal sigmoid, sessile 28mm polyp at splenic flexure, larger polyp tubulovillous adenoma and smaller polyp tubular adenoma   . COLONOSCOPY  09-09-15  . COLONOSCOPY WITH PROPOFOL N/A 03/01/2016   Procedure: COLONOSCOPY WITH PROPOFOL;  Surgeon: Garlan Fair, MD;  Location: [facility];  Service: Endoscopy;  Laterality: N/A;  . NASAL SEPTOPLASTY W/ TURBINOPLASTY  10/04/2011   Procedure: NASAL SEPTOPLASTY WITH TURBINATE REDUCTION;  Surgeon: Ascencion Dike, MD;  Location: [place];  Service: ENT;  Laterality: Bilateral;  . WISDOM TOOTH EXTRACTION  2009    Family History  Problem Relation Age of Onset  . Kidney cancer Mother   . Hypertension Mother   . Migraines Mother   . Multiple sclerosis Mother   . Fibromyalgia  Mother   . Liver disease Mother     NASH   . Hypertension Father   . Liver disease Father     liver transplant; had fatty liver disease  . Spina bifida Brother   . Migraines Maternal Grandmother   . Hypertension Other   . Colon cancer Paternal Grandfather     Social History  Substance Use Topics  . Smoking status: Former Smoker    Packs/day: 0.25    Years: 10.00    Quit date: 08/23/2008  . Smokeless tobacco: Never Used  . Alcohol use No    OB History    Gravida Para Term Preterm AB Living   4 4 4     4    SAB TAB Ectopic Multiple Live Births                  Obstetric Comments   1st Menstrual Cycle:  10  1st Pregnancy:  16       Review of Systems  Constitutional: Negative.   HENT: Negative.   Eyes: Negative.   Respiratory: Negative.   Cardiovascular: Negative.   Gastrointestinal: Negative.   Endocrine: Negative.   Genitourinary: Positive for dysuria, hematuria and urgency.  Musculoskeletal: Negative.   Skin: Negative.   Allergic/Immunologic: Negative.   Neurological: Negative.   Hematological: Negative.   Psychiatric/Behavioral: Negative.     Allergies  Bentyl [dicyclomine hcl]; Haldol [  haloperidol]; Meclizine; Morphine and related; Toradol [ketorolac tromethamine]; Macrobid [nitrofurantoin monohyd macro]; and Sulfa antibiotics  Home Medications    BP 142/85 (BP Location: Right Arm)   Pulse 81   Temp 98.5 F (36.9 C)   Resp 18   Ht 5\' 3"  (1.6 m)   Wt 179 lb 12.8 oz (81.6 kg)   LMP  (LMP Unknown)   BMI 31.85 kg/m   Physical Exam  Constitutional: She is oriented to person, place, and time. She appears well-developed and well-nourished.  HENT:  Head: Normocephalic.  Eyes: Pupils are equal, round, and reactive to light.  Neck: Normal range of motion.  Cardiovascular: Normal rate, regular rhythm, normal heart sounds and intact distal pulses.   Pulmonary/Chest: Effort normal and breath sounds normal.  Abdominal: Soft. Bowel sounds are normal.   Musculoskeletal: Normal range of motion.  Neurological: She is alert and oriented to person, place, and time. She has normal reflexes.  Skin: Skin is warm and dry.  Psychiatric: She has a normal mood and affect. Her behavior is normal. Judgment and thought content normal.    MAU Course  Procedures (including critical care time)  Labs Reviewed  URINALYSIS, [place] (NOT AT [facility])   No results found.   No diagnosis found.    MDM  UTI, culture pending by primary provider. Will tx and d/c home

## 2016-03-14 ENCOUNTER — Ambulatory Visit (HOSPITAL_COMMUNITY): Payer: Self-pay | Admitting: Licensed Clinical Social Worker

## 2016-03-15 ENCOUNTER — Encounter: Payer: Self-pay | Admitting: Obstetrics

## 2016-03-15 ENCOUNTER — Ambulatory Visit (INDEPENDENT_AMBULATORY_CARE_PROVIDER_SITE_OTHER): Payer: Medicaid Other | Admitting: Obstetrics

## 2016-03-15 DIAGNOSIS — R319 Hematuria, unspecified: Secondary | ICD-10-CM

## 2016-03-15 DIAGNOSIS — R102 Pelvic and perineal pain unspecified side: Secondary | ICD-10-CM

## 2016-03-15 DIAGNOSIS — R300 Dysuria: Secondary | ICD-10-CM | POA: Diagnosis not present

## 2016-03-15 DIAGNOSIS — N898 Other specified noninflammatory disorders of vagina: Secondary | ICD-10-CM | POA: Diagnosis not present

## 2016-03-15 LAB — POCT URINALYSIS DIPSTICK
Bilirubin, UA: NEGATIVE
Blood, UA: 250
GLUCOSE UA: NEGATIVE
KETONES UA: NEGATIVE
LEUKOCYTES UA: NEGATIVE
Nitrite, UA: NEGATIVE
PROTEIN UA: NEGATIVE
Spec Grav, UA: 1.015
Urobilinogen, UA: NEGATIVE
pH, UA: 5

## 2016-03-15 MED ORDER — CITRANATAL HARMONY 29-1-265 MG PO CAPS: 1.0000 | ORAL_CAPSULE | Freq: Every day | ORAL | 1 refills | Status: DC

## 2016-03-15 NOTE — Progress Notes (Signed)
Patient ID: Christina Fox, female   DOB: [birth year], 35 y.o.   MRN: [id number]  No chief complaint on file.   HPI Christina Fox is a 35 y.o. female.  C/O backache, RLQ abdominal pain and blood and sediment in urine.  Also has pinkish vaginal discharge.  Denies fever / chills, N/V, diarrhea or constipation.Has a history of kidney stones.  Currently taking a 2 week course of Cipro.  Seen by Urologist ~ 1 month ago with a negative w/u. HPI  Past Medical History:  Diagnosis Date  . Abdominal pain    "right lower quadrant pain and right lower back pain  . Anxiety   . Complication of anesthesia    states small mouth  . Deviated septum 09/2011  . GERD (gastroesophageal reflux disease)   . Headache(784.0)    sinus  . Hypertension    no meds  . Kidney stones    no current problem  . Migraine   . Nasal turbinate hypertrophy 09/2011   bilat.  . Pelvic inflammatory disease   . Urinary tract infection     Past Surgical History:  Procedure Laterality Date  . CHOLECYSTECTOMY N/A 01/15/2016   Procedure: LAPAROSCOPIC CHOLECYSTECTOMY WITH INTRAOPERATIVE CHOLANGIOGRAM;  Surgeon: Christene Lye, MD;  Location: [facility];  Service: General;  Laterality: N/A;  . COLONOSCOPY  2007   [facility]: pedunculated 42mm polyp in distal sigmoid, sessile 59mm polyp at splenic flexure, larger polyp tubulovillous adenoma and smaller polyp tubular adenoma   . COLONOSCOPY  09-09-15  . COLONOSCOPY WITH PROPOFOL N/A 03/01/2016   Procedure: COLONOSCOPY WITH PROPOFOL;  Surgeon: Garlan Fair, MD;  Location: [facility];  Service: Endoscopy;  Laterality: N/A;  . NASAL SEPTOPLASTY W/ TURBINOPLASTY  10/04/2011   Procedure: NASAL SEPTOPLASTY WITH TURBINATE REDUCTION;  Surgeon: Ascencion Dike, MD;  Location: [place];  Service: ENT;  Laterality: Bilateral;  . WISDOM TOOTH EXTRACTION  2009    Family History  Problem Relation Age of Onset  . Kidney cancer Mother   . Hypertension Mother   .  Migraines Mother   . Multiple sclerosis Mother   . Fibromyalgia Mother   . Liver disease Mother     NASH   . Hypertension Father   . Liver disease Father     liver transplant; had fatty liver disease  . Spina bifida Brother   . Migraines Maternal Grandmother   . Hypertension Other   . Colon cancer Paternal Grandfather     Social History Social History  Substance Use Topics  . Smoking status: Former Smoker    Packs/day: 0.25    Years: 10.00    Quit date: 08/23/2008  . Smokeless tobacco: Never Used  . Alcohol use No    Allergies  Allergen Reactions  . Bentyl [Dicyclomine Hcl] Other (See Comments)    DIZZINESS  . Haldol [Haloperidol] Other (See Comments)    Made her feel very jittery and agitated  . Meclizine     Chest pain  . Morphine And Related Other (See Comments)    Made her feel like she was losing her mind  . Toradol [Ketorolac Tromethamine] Other (See Comments)    Made her feel very jittery and agitated  . Macrobid [Nitrofurantoin Monohyd Macro] Rash  . Sulfa Antibiotics Hives and Rash    Current Outpatient Prescriptions  Medication Sig Dispense Refill  . ciprofloxacin (CIPRO) 500 MG tablet Take 1 tablet (500 mg total) by mouth 2 (two) times daily. 28 tablet 0  .  clonazePAM (KLONOPIN) 0.5 MG tablet Take 1 tablet (0.5 mg total) by mouth daily. (Patient taking differently: Take 0.25 mg by mouth 2 (two) times daily. ) 30 tablet 5  . fluticasone (FLONASE) 50 MCG/ACT nasal spray Place 2 sprays into both nostrils daily. 16 g 6  . ibuprofen (ADVIL,MOTRIN) 800 MG tablet Take 1 tablet (800 mg total) by mouth 3 (three) times daily. 21 tablet 0  . levonorgestrel-ethinyl estradiol (AUBRA) 0.1-20 MG-MCG tablet Take 1 tablet by mouth daily. Reported on 12/04/2015    . montelukast (SINGULAIR) 10 MG tablet TAKE 1 TABLET BY MOUTH DAILY 30 tablet 2  . Multiple Vitamins-Calcium (ONE-A-DAY WOMENS PO) Take 1 tablet by mouth every morning.    Marland Kitchen oxyCODONE-acetaminophen (ROXICET) 5-325 MG  tablet Take 1 tablet by mouth every 4 (four) hours as needed. 30 tablet 0  . pantoprazole (PROTONIX) 40 MG tablet Take 1 tablet (40 mg total) by mouth daily. 30 tablet 3  . phenazopyridine (PYRIDIUM) 200 MG tablet Take 1 tablet (200 mg total) by mouth 3 (three) times daily. 6 tablet 0  . potassium chloride SA (K-DUR,KLOR-CON) 20 MEQ tablet Take 20 mEq by mouth 2 (two) times a week.    . Prenat w/o A-FeCbn-DSS-FA-DHA (CITRANATAL HARMONY) 29-1-265 MG CAPS Take 1 capsule by mouth daily before breakfast. 30 capsule 1   No current facility-administered medications for this visit.     Review of Systems Review of Systems Constitutional: negative for fatigue and weight loss Respiratory: negative for cough and wheezing Cardiovascular: negative for chest pain, fatigue and palpitations Gastrointestinal: negative for abdominal pain and change in bowel habits Genitourinary:positive for RLQ pain and hematuria / dysuria, and vaginal discharge Integument/breast: negative for nipple discharge Musculoskeletal:positive for myalgias Neurological: negative for gait problems and tremors Behavioral/Psych: negative for abusive relationship, depression Endocrine: negative for temperature intolerance     Blood pressure (!) (P) 148/88, pulse (P) 91.  Physical Exam Physical Exam General:   alert  Skin:   no rash or abnormalities  Lungs:   clear to auscultation bilaterally  Heart:   regular rate and rhythm, S1, S2 normal, no murmur, click, rub or gallop  Breasts:   normal without suspicious masses, skin or nipple changes or axillary nodes  Abdomen:  normal findings: no organomegaly, soft, non-tender and no hernia  Pelvis:  External genitalia: normal general appearance Urinary system: urethral meatus normal and bladder without fullness, nontender Vaginal: normal without tenderness, induration or masses.  Gray/Brownish vaginal discharge Cervix: normal appearance Adnexa: RLQ tenderness, but no masses Uterus:  anteverted and non-tender, normal size    50% of 20 min visit spent on counseling and coordination of care.   Data Reviewed Labs CT scan   Assessment     Pelvic pain  Backache Hematuria Dysuria Vaginal discharge    Plan    Ultrasound ordered Continue Cipro F/U in 2 weeks  Orders Placed This Encounter  Procedures  . Urine culture  . US Pelvis Complete    Standing Status:   Future    Standing Expiration Date:   05/15/2017    Order Specific Question:   Reason for Exam (SYMPTOM  OR DIAGNOSIS REQUIRED)    Answer:   pelvic pain    Order Specific Question:   Preferred imaging location?    Answer:   Internal  . US Transvaginal Non-OB    Standing Status:   Future    Standing Expiration Date:   05/15/2017    Order Specific Question:   Reason for Exam (SYMPTOM  OR DIAGNOSIS REQUIRED)    Answer:   pelvic pain    Order Specific Question:   Preferred imaging location?    Answer:   Internal  . POCT urinalysis dipstick   Meds ordered this encounter  Medications  . Prenat w/o A-FeCbn-DSS-FA-DHA (CITRANATAL HARMONY) 29-1-265 MG CAPS    Sig: Take 1 capsule by mouth daily before breakfast.    Dispense:  30 capsule    Refill:  1

## 2016-03-16 ENCOUNTER — Telehealth: Payer: Self-pay | Admitting: *Deleted

## 2016-03-16 NOTE — Addendum Note (Signed)
Addended by: Baltazar Najjar A on: 03/16/2016 03:23 PM   Modules accepted: Orders

## 2016-03-16 NOTE — Telephone Encounter (Signed)
Patients [place] date changed to 03/24/16 1:30 PM patient is aware.[facility] called to [id number].

## 2016-03-17 LAB — URINE CULTURE

## 2016-03-17 MED FILL — ?MONTELUKAST SOD 10 MG TAB: 10 | 30 days supply | Qty: 30 | Fill #1

## 2016-03-19 ENCOUNTER — Other Ambulatory Visit: Payer: Self-pay | Admitting: Obstetrics

## 2016-03-19 DIAGNOSIS — B373 Candidiasis of vulva and vagina: Secondary | ICD-10-CM

## 2016-03-19 DIAGNOSIS — B3731 Acute candidiasis of vulva and vagina: Secondary | ICD-10-CM

## 2016-03-19 LAB — NUSWAB VG, CANDIDA 6SP
CANDIDA GLABRATA, NAA: NEGATIVE
CANDIDA KRUSEI, NAA: NEGATIVE
CANDIDA TROPICALIS, NAA: NEGATIVE
Candida albicans, NAA: POSITIVE — AB
Candida lusitaniae, NAA: NEGATIVE
Candida parapsilosis, NAA: NEGATIVE
TRICH VAG BY NAA: NEGATIVE

## 2016-03-19 MED ORDER — FLUCONAZOLE 150 MG PO TABS: 150.0000 mg | ORAL_TABLET | Freq: Once | ORAL | 2 refills | Status: AC

## 2016-03-21 ENCOUNTER — Other Ambulatory Visit: Payer: Self-pay | Admitting: Obstetrics

## 2016-03-21 DIAGNOSIS — B373 Candidiasis of vulva and vagina: Secondary | ICD-10-CM

## 2016-03-21 DIAGNOSIS — B3731 Acute candidiasis of vulva and vagina: Secondary | ICD-10-CM

## 2016-03-21 MED ORDER — FLUCONAZOLE 150 MG PO TABS: 150.0000 mg | ORAL_TABLET | Freq: Once | ORAL | 2 refills | Status: AC

## 2016-03-24 ENCOUNTER — Other Ambulatory Visit: Payer: Self-pay

## 2016-03-25 ENCOUNTER — Ambulatory Visit (HOSPITAL_COMMUNITY)
Admission: RE | Admit: 2016-03-25 | Discharge: 2016-03-25 | Disposition: A | Discharge status: Home / Self Care | Payer: Medicaid Other | Source: Ambulatory Visit | Attending: Obstetrics | Admitting: Obstetrics

## 2016-03-25 DIAGNOSIS — R102 Pelvic and perineal pain: Secondary | ICD-10-CM

## 2016-03-28 ENCOUNTER — Other Ambulatory Visit: Payer: Self-pay | Admitting: Internal Medicine

## 2016-03-28 MED FILL — PANTOPRAZOLE SOD DR 40 MG T: 40 | 30 days supply | Qty: 30 | Fill #0

## 2016-03-29 ENCOUNTER — Other Ambulatory Visit: Payer: Self-pay

## 2016-03-29 ENCOUNTER — Ambulatory Visit (INDEPENDENT_AMBULATORY_CARE_PROVIDER_SITE_OTHER): Payer: Medicaid Other | Admitting: Obstetrics

## 2016-03-29 ENCOUNTER — Ambulatory Visit: Payer: Self-pay

## 2016-03-29 VITALS — BP 153/82 | HR 64 | Wt 178.0 lb

## 2016-03-29 DIAGNOSIS — M62830 Muscle spasm of back: Secondary | ICD-10-CM | POA: Diagnosis not present

## 2016-03-29 DIAGNOSIS — R102 Pelvic and perineal pain: Secondary | ICD-10-CM

## 2016-03-29 MED ORDER — IBUPROFEN 800 MG PO TABS: 800.0000 mg | ORAL_TABLET | Freq: Three times a day (TID) | ORAL | 5 refills | Status: DC | PRN

## 2016-03-29 MED ORDER — CITRANATAL HARMONY 29-1-265 MG PO CAPS: 1.0000 | ORAL_CAPSULE | Freq: Every day | ORAL | 11 refills | Status: DC

## 2016-03-29 MED ORDER — CYCLOBENZAPRINE HCL 10 MG PO TABS: 10.0000 mg | ORAL_TABLET | Freq: Three times a day (TID) | ORAL | 1 refills | Status: DC | PRN

## 2016-03-29 MED FILL — CYCLOBENZAPRINE 10 MG TAB: 10 | 10 days supply | Qty: 30 | Fill #0 | Status: TO

## 2016-03-29 MED FILL — IBUPROFEN 800 MG TABLET: 800 | 10 days supply | Qty: 30 | Fill #0

## 2016-03-30 LAB — COMPREHENSIVE METABOLIC PANEL
A/G RATIO: 1.7 (ref 1.2–2.2)
ALK PHOS: 55 IU/L (ref 39–117)
ALT: 21 IU/L (ref 0–32)
AST: 19 IU/L (ref 0–40)
Albumin: 4.2 g/dL (ref 3.5–5.5)
BILIRUBIN TOTAL: 0.4 mg/dL (ref 0.0–1.2)
BUN/Creatinine Ratio: 8 — ABNORMAL LOW (ref 9–23)
BUN: 6 mg/dL (ref 6–20)
CHLORIDE: 100 mmol/L (ref 96–106)
CO2: 26 mmol/L (ref 18–29)
Calcium: 9.3 mg/dL (ref 8.7–10.2)
Creatinine, Ser: 0.72 mg/dL (ref 0.57–1.00)
GFR calc non Af Amer: 108 mL/min/{1.73_m2} (ref >59)
GFR, EST AFRICAN AMERICAN: 125 mL/min/{1.73_m2} (ref >59)
GLUCOSE: 76 mg/dL (ref 65–99)
Globulin, Total: 2.5 g/dL (ref 1.5–4.5)
POTASSIUM: 3.3 mmol/L — AB (ref 3.5–5.2)
Sodium: 141 mmol/L (ref 134–144)
Total Protein: 6.7 g/dL (ref 6.0–8.5)

## 2016-03-30 LAB — CBC
HEMOGLOBIN: 12.6 g/dL (ref 11.1–15.9)
Hematocrit: 38.3 % (ref 34.0–46.6)
MCH: 28.8 pg (ref 26.6–33.0)
MCHC: 32.9 g/dL (ref 31.5–35.7)
MCV: 88 fL (ref 79–97)
Platelets: 347 10*3/uL (ref 150–379)
RBC: 4.37 x10E6/uL (ref 3.77–5.28)
RDW: 13 % (ref 12.3–15.4)
WBC: 6.2 10*3/uL (ref 3.4–10.8)

## 2016-03-31 ENCOUNTER — Telehealth: Payer: Self-pay | Admitting: *Deleted

## 2016-03-31 NOTE — Telephone Encounter (Signed)
Placed call to pt to discuss prescriptions. Pt made aware that Rx's have been sent to correct pharmacy. Pt states that PNV are on backorder, pt made aware she can get samples from office.

## 2016-04-01 ENCOUNTER — Other Ambulatory Visit: Payer: Self-pay | Admitting: Obstetrics

## 2016-04-04 ENCOUNTER — Encounter: Payer: Self-pay | Admitting: Obstetrics

## 2016-04-04 ENCOUNTER — Ambulatory Visit: Payer: Self-pay | Admitting: Obstetrics

## 2016-04-04 NOTE — Progress Notes (Signed)
Patient ID: Christina Fox, female   DOB: 23-Apr-1981, 35 y.o.   MRN: [id number]  Chief Complaint  Patient presents with  . Follow-up    HPI Christina Fox is a 35 y.o. female.  Presents for follow up for ultrasound results.  H/O pelvic pain, resolved.  Now has backache. HPI  Past Medical History:  Diagnosis Date  . Abdominal pain    "right lower quadrant pain and right lower back pain  . Anxiety   . Complication of anesthesia    states small mouth  . Deviated septum 09/2011  . GERD (gastroesophageal reflux disease)   . Headache(784.0)    sinus  . Hypertension    no meds  . Kidney stones    no current problem  . Migraine   . Nasal turbinate hypertrophy 09/2011   bilat.  . Pelvic inflammatory disease   . Urinary tract infection     Past Surgical History:  Procedure Laterality Date  . CHOLECYSTECTOMY N/A 01/15/2016   Procedure: LAPAROSCOPIC CHOLECYSTECTOMY WITH INTRAOPERATIVE CHOLANGIOGRAM;  Surgeon: Christene Lye, MD;  Location: [facility];  Service: General;  Laterality: N/A;  . COLONOSCOPY  2007   [facility]: pedunculated 39mm polyp in distal sigmoid, sessile 62mm polyp at splenic flexure, larger polyp tubulovillous adenoma and smaller polyp tubular adenoma   . COLONOSCOPY  09-09-15  . COLONOSCOPY WITH PROPOFOL N/A 03/01/2016   Procedure: COLONOSCOPY WITH PROPOFOL;  Surgeon: Garlan Fair, MD;  Location: [facility];  Service: Endoscopy;  Laterality: N/A;  . NASAL SEPTOPLASTY W/ TURBINOPLASTY  10/04/2011   Procedure: NASAL SEPTOPLASTY WITH TURBINATE REDUCTION;  Surgeon: Ascencion Dike, MD;  Location: [facility];  Service: ENT;  Laterality: Bilateral;  . WISDOM TOOTH EXTRACTION  2009    Family History  Problem Relation Age of Onset  . Kidney cancer Mother   . Hypertension Mother   . Migraines Mother   . Multiple sclerosis Mother   . Fibromyalgia Mother   . Liver disease Mother     NASH   . Hypertension Father   . Liver disease Father     liver  transplant; had fatty liver disease  . Spina bifida Brother   . Migraines Maternal Grandmother   . Hypertension Other   . Colon cancer Paternal Grandfather     Social History Social History  Substance Use Topics  . Smoking status: Former Smoker    Packs/day: 0.25    Years: 10.00    Quit date: 08/23/2008  . Smokeless tobacco: Never Used  . Alcohol use No    Allergies  Allergen Reactions  . Bentyl [Dicyclomine Hcl] Other (See Comments)    DIZZINESS  . Haldol [Haloperidol] Other (See Comments)    Made her feel very jittery and agitated  . Meclizine     Chest pain  . Morphine And Related Other (See Comments)    Made her feel like she was losing her mind  . Toradol [Ketorolac Tromethamine] Other (See Comments)    Made her feel very jittery and agitated  . Macrobid [Nitrofurantoin Monohyd Macro] Rash  . Sulfa Antibiotics Hives and Rash    Current Outpatient Prescriptions  Medication Sig Dispense Refill  . ciprofloxacin (CIPRO) 500 MG tablet Take 1 tablet (500 mg total) by mouth 2 (two) times daily. 28 tablet 0  . clonazePAM (KLONOPIN) 0.5 MG tablet Take 1 tablet (0.5 mg total) by mouth daily. (Patient taking differently: Take 0.25 mg by mouth 2 (two) times daily. ) 30 tablet  5  . cyclobenzaprine (FLEXERIL) 10 MG tablet Take 1 tablet (10 mg total) by mouth every 8 (eight) hours as needed for muscle spasms. 30 tablet 1  . fluticasone (FLONASE) 50 MCG/ACT nasal spray Place 2 sprays into both nostrils daily. 16 g 6  . ibuprofen (ADVIL,MOTRIN) 800 MG tablet Take 1 tablet (800 mg total) by mouth 3 (three) times daily. 21 tablet 0  . ibuprofen (ADVIL,MOTRIN) 800 MG tablet Take 1 tablet (800 mg total) by mouth every 8 (eight) hours as needed. 30 tablet 5  . levonorgestrel-ethinyl estradiol (AUBRA) 0.1-20 MG-MCG tablet Take 1 tablet by mouth daily. Reported on 12/04/2015    . montelukast (SINGULAIR) 10 MG tablet TAKE 1 TABLET BY MOUTH DAILY 30 tablet 2  . Multiple Vitamins-Calcium  (ONE-A-DAY WOMENS PO) Take 1 tablet by mouth every morning.    Marland Kitchen oxyCODONE-acetaminophen (ROXICET) 5-325 MG tablet Take 1 tablet by mouth every 4 (four) hours as needed. 30 tablet 0  . pantoprazole (PROTONIX) 40 MG tablet TAKE 1 TABLET BY MOUTH ONCE DAILY. 30 tablet 2  . phenazopyridine (PYRIDIUM) 200 MG tablet Take 1 tablet (200 mg total) by mouth 3 (three) times daily. 6 tablet 0  . potassium chloride SA (K-DUR,KLOR-CON) 20 MEQ tablet Take 20 mEq by mouth 2 (two) times a week.    . Prenat w/o A-FeCbn-DSS-FA-DHA (CITRANATAL HARMONY) 29-1-265 MG CAPS Take 1 capsule by mouth daily before breakfast. 30 capsule 11   No current facility-administered medications for this visit.     Review of Systems Review of Systems Constitutional: negative for fatigue and weight loss Respiratory: negative for cough and wheezing Cardiovascular: negative for chest pain, fatigue and palpitations Gastrointestinal: negative for abdominal pain and change in bowel habits Genitourinary:negative Integument/breast: negative for nipple discharge Musculoskeletal:negative for myalgias Neurological: negative for gait problems and tremors Behavioral/Psych: negative for abusive relationship, depression Endocrine: negative for temperature intolerance     Blood pressure (!) 153/82, pulse 64, weight 178 lb (80.7 kg).  Physical Exam Physical Exam:  Deferred  >50% of 10 min visit spent on counseling and coordination of care.   Data Reviewed Ultrasound:  Unremarkable  Assessment     History of pelvic pain, resolved Backache    Plan     Ibuprofen / Flexeril Rx  F/U prn  Orders Placed This Encounter  Procedures  . Comprehensive metabolic panel  . CBC   Meds ordered this encounter  Medications  . Prenat w/o A-FeCbn-DSS-FA-DHA (CITRANATAL HARMONY) 29-1-265 MG CAPS    Sig: Take 1 capsule by mouth daily before breakfast.    Dispense:  30 capsule    Refill:  11  . ibuprofen (ADVIL,MOTRIN) 800 MG tablet     Sig: Take 1 tablet (800 mg total) by mouth every 8 (eight) hours as needed.    Dispense:  30 tablet    Refill:  5  . cyclobenzaprine (FLEXERIL) 10 MG tablet    Sig: Take 1 tablet (10 mg total) by mouth every 8 (eight) hours as needed for muscle spasms.    Dispense:  30 tablet    Refill:  1

## 2016-04-06 ENCOUNTER — Ambulatory Visit: Payer: Medicaid Other | Admitting: Physical Therapy

## 2016-04-12 ENCOUNTER — Emergency Department (HOSPITAL_COMMUNITY): Payer: Medicaid Other

## 2016-04-12 ENCOUNTER — Emergency Department (HOSPITAL_COMMUNITY)
Admission: EM | Admit: 2016-04-12 | Discharge: 2016-04-13 | Disposition: A | Discharge status: Home / Self Care | Payer: Medicaid Other | Attending: Emergency Medicine | Admitting: Emergency Medicine

## 2016-04-12 ENCOUNTER — Encounter (HOSPITAL_COMMUNITY): Payer: Self-pay

## 2016-04-12 ENCOUNTER — Ambulatory Visit: Payer: Medicaid Other | Attending: Urology | Admitting: Physical Therapy

## 2016-04-12 DIAGNOSIS — R109 Unspecified abdominal pain: Secondary | ICD-10-CM | POA: Diagnosis not present

## 2016-04-12 DIAGNOSIS — I10 Essential (primary) hypertension: Secondary | ICD-10-CM | POA: Insufficient documentation

## 2016-04-12 DIAGNOSIS — Z79899 Other long term (current) drug therapy: Secondary | ICD-10-CM | POA: Insufficient documentation

## 2016-04-12 DIAGNOSIS — R319 Hematuria, unspecified: Secondary | ICD-10-CM | POA: Diagnosis not present

## 2016-04-12 DIAGNOSIS — Z87891 Personal history of nicotine dependence: Secondary | ICD-10-CM | POA: Insufficient documentation

## 2016-04-12 DIAGNOSIS — R0789 Other chest pain: Secondary | ICD-10-CM | POA: Diagnosis not present

## 2016-04-12 DIAGNOSIS — Z7951 Long term (current) use of inhaled steroids: Secondary | ICD-10-CM | POA: Diagnosis not present

## 2016-04-12 LAB — URINALYSIS, ROUTINE W REFLEX MICROSCOPIC
Bilirubin Urine: NEGATIVE
Glucose, UA: NEGATIVE mg/dL
KETONES UR: NEGATIVE mg/dL
LEUKOCYTES UA: NEGATIVE
NITRITE: NEGATIVE
PROTEIN: NEGATIVE mg/dL
Specific Gravity, Urine: 1.005 (ref 1.005–1.030)
pH: 6.5 (ref 5.0–8.0)

## 2016-04-12 LAB — BASIC METABOLIC PANEL
ANION GAP: 7 (ref 5–15)
BUN: 7 mg/dL (ref 6–20)
CO2: 28 mmol/L (ref 22–32)
Calcium: 9.5 mg/dL (ref 8.9–10.3)
Chloride: 104 mmol/L (ref 101–111)
Creatinine, Ser: 0.68 mg/dL (ref 0.44–1.00)
GFR calc Af Amer: >60 mL/min (ref >60)
GLUCOSE: 106 mg/dL — AB (ref 65–99)
POTASSIUM: 3.5 mmol/L (ref 3.5–5.1)
Sodium: 139 mmol/L (ref 135–145)

## 2016-04-12 LAB — CBC
HEMATOCRIT: 39.4 % (ref 36.0–46.0)
HEMOGLOBIN: 13.9 g/dL (ref 12.0–15.0)
MCH: 30.3 pg (ref 26.0–34.0)
MCHC: 35.3 g/dL (ref 30.0–36.0)
MCV: 86 fL (ref 78.0–100.0)
Platelets: 316 10*3/uL (ref 150–400)
RBC: 4.58 MIL/uL (ref 3.87–5.11)
RDW: 12.3 % (ref 11.5–15.5)
WBC: 6.5 10*3/uL (ref 4.0–10.5)

## 2016-04-12 LAB — I-STAT TROPONIN, ED
Troponin i, poc: 0 ng/mL (ref 0.00–0.08)
Troponin i, poc: 0.02 ng/mL (ref 0.00–0.08)

## 2016-04-12 LAB — URINE MICROSCOPIC-ADD ON

## 2016-04-12 LAB — D-DIMER, QUANTITATIVE: D-Dimer, Quant: <0.27 ug/mL-FEU (ref 0.00–0.50)

## 2016-04-12 LAB — LIPASE, BLOOD: LIPASE: 31 U/L (ref 11–51)

## 2016-04-12 MED ORDER — GI COCKTAIL ~~LOC~~
30.0000 mL | Freq: Once | ORAL | Status: AC
  Administered 2016-04-13: 30 mL via ORAL
  Filled 2016-04-12: qty 30

## 2016-04-12 NOTE — ED Triage Notes (Signed)
Patient c/o chest pain and SOB that began x1 hours ago.  Patient states that the pain radiates into her left arm and her fingers are tingling.  Patient states that she has been dizzy and has had diarrhea today.  Denies blood in her stool.  Patient states that she has also been having a brownish discharge when she urinates that has been going on since august 2017.

## 2016-04-12 NOTE — ED Provider Notes (Signed)
[facility] DEPT Provider Note   CSN: [id number] Arrival date & time: 04/12/16  1919     History   Chief Complaint Chief Complaint  Patient presents with  . Chest Pain  . Abdominal Pain    HPI Christina Fox is a 35 y.o. female.  The history is provided by the patient.  Chest Pain   This is a new problem. The problem occurs constantly. The pain is present in the lateral region. The quality of the pain is described as pressure-like. The pain radiates to the left shoulder. Associated symptoms include abdominal pain. Pertinent negatives include no back pain, no cough, no diaphoresis, no fever, no leg pain, no lower extremity edema, no nausea, no palpitations, no shortness of breath and no vomiting.  Her past medical history is significant for hypertension.  Pertinent negatives for past medical history include no CAD, no DVT, no hyperlipidemia, no PE and no seizures.  Procedure history is positive for stress thallium (negative on 6/17).  Abdominal Pain   This is a recurrent problem. Episode onset: 1 month. Episode frequency: intermittent. The pain is located in the RUQ and RLQ. The pain is moderate. Associated symptoms include hematuria. Pertinent negatives include fever, nausea, vomiting, dysuria and arthralgias.    Past Medical History:  Diagnosis Date  . Abdominal pain    "right lower quadrant pain and right lower back pain  . Anxiety   . Complication of anesthesia    states small mouth  . Deviated septum 09/2011  . GERD (gastroesophageal reflux disease)   . Headache(784.0)    sinus  . Hypertension    no meds  . Kidney stones    no current problem  . Migraine   . Nasal turbinate hypertrophy 09/2011   bilat.  . Pelvic inflammatory disease   . Urinary tract infection     Patient Active Problem List   Diagnosis Date Noted  . Gallstone 01/07/2016  . History of colonic polyps 11/27/2015  . Right lower quadrant abdominal pain 11/27/2015  . HTN (hypertension),  benign 11/24/2015  . Abdominal pain 11/16/2015  . Headache 10/13/2015  . 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 Anxiety disorder 11/03/2014    Past Surgical History:  Procedure Laterality Date  . CHOLECYSTECTOMY N/A 01/15/2016   Procedure: LAPAROSCOPIC CHOLECYSTECTOMY WITH INTRAOPERATIVE CHOLANGIOGRAM;  Surgeon: Christene Lye, MD;  Location: [facility];  Service: General;  Laterality: N/A;  . COLONOSCOPY  2007   [facility]: pedunculated 95mm polyp in distal sigmoid, sessile 77mm polyp at splenic flexure, larger polyp tubulovillous adenoma and smaller polyp tubular adenoma   . COLONOSCOPY  09-09-15  . COLONOSCOPY WITH PROPOFOL N/A 03/01/2016   Procedure: COLONOSCOPY WITH PROPOFOL;  Surgeon: Garlan Fair, MD;  Location[facility];  Service: Endoscopy;  Laterality: N/A;  . NASAL SEPTOPLASTY W/ TURBINOPLASTY  10/04/2011   Procedure: NASAL SEPTOPLASTY WITH TURBINATE REDUCTION;  Surgeon: Ascencion Dike, MD;  Location: [facility];  Service: ENT;  Laterality: Bilateral;  . WISDOM TOOTH EXTRACTION  2009    OB History    Gravida Para Term Preterm AB Living   4 4 4     4    SAB TAB Ectopic Multiple Live Births                  Obstetric Comments   1st Menstrual Cycle:  10  1st Pregnancy:  16        Home Medications    Prior to Admission medications   Medication Sig Start  Date End Date Taking? Authorizing Provider  amoxicillin (AMOXIL) 500 MG capsule Take 500 mg by mouth 2 (two) times daily.   Yes Historical Provider, MD  clonazePAM (KLONOPIN) 0.5 MG tablet Take 1 tablet (0.5 mg total) by mouth daily. Patient taking differently: Take 0.25 mg by mouth 2 (two) times daily.  10/20/15  Yes Robyn Haber, MD  cyclobenzaprine (FLEXERIL) 10 MG tablet Take 1 tablet (10 mg total) by mouth every 8 (eight) hours as needed for muscle spasms. 03/29/16  Yes Shelly Bombard, MD  fluticasone (FLONASE) 50 MCG/ACT nasal spray Place 2 sprays into both nostrils daily. 12/22/15  Yes Maren Reamer, MD    ibuprofen (ADVIL,MOTRIN) 800 MG tablet Take 1 tablet (800 mg total) by mouth every 8 (eight) hours as needed. 03/29/16  Yes Shelly Bombard, MD  levonorgestrel-ethinyl estradiol (AUBRA) 0.1-20 MG-MCG tablet Take 1 tablet by mouth daily. Reported on 12/04/2015   Yes Historical Provider, MD  montelukast (SINGULAIR) 10 MG tablet TAKE 1 TABLET BY MOUTH DAILY 01/18/16  Yes Maren Reamer, MD  pantoprazole (PROTONIX) 40 MG tablet TAKE 1 TABLET BY MOUTH ONCE DAILY. 03/28/16  Yes Dawn Lazarus Gowda, MD  potassium chloride SA (K-DUR,KLOR-CON) 20 MEQ tablet Take 20 mEq by mouth 2 (two) times a week.   Yes Historical Provider, MD  Prenat w/o A-FeCbn-DSS-FA-DHA (CITRANATAL HARMONY) 29-1-265 MG CAPS Take 1 capsule by mouth daily before breakfast. 03/29/16  Yes Shelly Bombard, MD  ranitidine (ZANTAC) 150 MG tablet Take 150 mg by mouth daily as needed for heartburn.   Yes Historical Provider, MD  ciprofloxacin (CIPRO) 500 MG tablet Take 1 tablet (500 mg total) by mouth 2 (two) times daily. Patient not taking: Reported on 04/12/2016 03/05/16   Keitha Butte, CNM  ibuprofen (ADVIL,MOTRIN) 800 MG tablet Take 1 tablet (800 mg total) by mouth 3 (three) times daily. Patient not taking: Reported on 04/12/2016 03/05/16   Keitha Butte, CNM  oxyCODONE-acetaminophen (ROXICET) 5-325 MG tablet Take 1 tablet by mouth every 4 (four) hours as needed. Patient not taking: Reported on 04/12/2016 01/15/16   Seeplaputhur Robinette Haines, MD  phenazopyridine (PYRIDIUM) 200 MG tablet Take 1 tablet (200 mg total) by mouth 3 (three) times daily. Patient not taking: Reported on 04/12/2016 03/05/16   Keitha Butte, CNM    Family History Family History  Problem Relation Age of Onset  . Kidney cancer Mother   . Hypertension Mother   . Migraines Mother   . Multiple sclerosis Mother   . Fibromyalgia Mother   . Liver disease Mother     NASH   . Hypertension Father   . Liver disease Father     liver transplant; had fatty liver disease  . Spina  bifida Brother   . Migraines Maternal Grandmother   . Hypertension Other   . Colon cancer Paternal Grandfather     Social History Social History  Substance Use Topics  . Smoking status: Former Smoker    Packs/day: 0.25    Years: 10.00    Quit date: 08/23/2008  . Smokeless tobacco: Never Used  . Alcohol use No     Allergies   Bentyl [dicyclomine hcl]; Clarithromycin; Haldol [haloperidol]; Meclizine; Morphine and related; Toradol [ketorolac tromethamine]; Macrobid [nitrofurantoin monohyd macro]; and Sulfa antibiotics   Review of Systems Review of Systems  Constitutional: Negative for chills, diaphoresis and fever.  HENT: Negative for ear pain and sore throat.   Eyes: Negative for pain and visual disturbance.  Respiratory: Negative for  cough and shortness of breath.   Cardiovascular: Positive for chest pain. Negative for palpitations.  Gastrointestinal: Positive for abdominal pain. Negative for nausea and vomiting.  Genitourinary: Positive for hematuria. Negative for dysuria.  Musculoskeletal: Negative for arthralgias and back pain.  Skin: Negative for color change and rash.  Neurological: Negative for seizures and syncope.  All other systems reviewed and are negative.    Physical Exam Updated Vital Signs BP 160/89 (BP Location: Left Arm)   Pulse 77   Temp 98.3 F (36.8 C) (Oral)   Resp 16   Ht 5\' 2"  (1.575 m)   Wt 176 lb 11.2 oz (80.2 kg)   LMP  (LMP Unknown) Comment: has been spotting x 2-4 weeks  SpO2 99%   BMI 32.32 kg/m   Physical Exam  Constitutional: She is oriented to person, place, and time. She appears well-developed and well-nourished. No distress.  HENT:  Head: Normocephalic and atraumatic.  Nose: Nose normal.  Eyes: Conjunctivae and EOM are normal. Pupils are equal, round, and reactive to light. Right eye exhibits no discharge. Left eye exhibits no discharge. No scleral icterus.  Neck: Normal range of motion. Neck supple.  Cardiovascular: Normal  rate and regular rhythm.  Exam reveals no gallop and no friction rub.   No murmur heard. Pulmonary/Chest: Effort normal and breath sounds normal. No stridor. No respiratory distress. She has no rales.  Abdominal: Soft. She exhibits no distension. There is no tenderness. There is CVA tenderness (right).  Musculoskeletal: She exhibits no edema or tenderness.  Neurological: She is alert and oriented to person, place, and time.  Skin: Skin is warm and dry. No rash noted. She is not diaphoretic. No erythema.  Psychiatric: She has a normal mood and affect.  Vitals reviewed.    ED Treatments / Results  Labs (all labs ordered are listed, but only abnormal results are displayed) Labs Reviewed  BASIC METABOLIC PANEL - Abnormal; Notable for the following:       Result Value   Glucose, Bld 106 (*)    All other components within normal limits  URINALYSIS, ROUTINE W REFLEX MICROSCOPIC (NOT AT [facility]) - Abnormal; Notable for the following:    Hgb urine dipstick SMALL (*)    All other components within normal limits  URINE MICROSCOPIC-ADD ON - Abnormal; Notable for the following:    Squamous Epithelial / LPF 0-5 (*)    Bacteria, UA FEW (*)    All other components within normal limits  CBC  LIPASE, BLOOD  D-DIMER, QUANTITATIVE (NOT AT [facility])  I-STAT TROPOININ, ED  I-STAT TROPOININ, ED    EKG  EKG Interpretation  Date/Time:  Tuesday April 12 2016 19:32:31 EDT Ventricular Rate:  77 PR Interval:    QRS Duration: 70 QT Interval:  401 QTC Calculation: 454 R Axis:   72 Text Interpretation:  Sinus rhythm Borderline repolarization abnormality No significant change since last tracing Confirmed by [facility] MD, Jamisyn Langer ([id number]) on 04/12/2016 9:21:25 PM       Radiology Dg Chest 2 View  Result Date: 04/12/2016 CLINICAL DATA:  Central chest pain and chills for 2 days, chronic chest and abdominal pain for 3 months, prior cholecystectomy, history kidney stones, hypertension, former smoker EXAM: CHEST   2 VIEW COMPARISON:  01/22/2016 FINDINGS: Normal heart size, mediastinal contours, and pulmonary vascularity. Lungs clear. No pleural effusion or pneumothorax. Bones unremarkable. IMPRESSION: Normal exam. Electronically Signed   By: Lavonia Dana M.D.   On: 04/12/2016 20:35   US Renal  Result Date:  04/12/2016 CLINICAL DATA:  Right flank pain, 2 months duration.  Hematuria. EXAM: RENAL / URINARY TRACT ULTRASOUND COMPLETE COMPARISON:  CT 02/17/2016 hand 11/14/2015 FINDINGS: Right Kidney: Length: 11.6 cm. Echogenicity within normal limits. No mass or hydronephrosis visualized. Tiny right renal stone seen by CT is too small to resolve by ultrasound. Left Kidney: Length: 12.0 cm. Echogenicity within normal limits. No mass or hydronephrosis visualized. Bladder: Appears normal for degree of bladder distention. IMPRESSION: Normal urinary tract ultrasound. Tiny right renal stone shown on previous CTs is too small to resolve. No evidence of hydronephrosis. Electronically Signed   By: Nelson Chimes M.D.   On: 04/12/2016 22:27    Procedures Procedures (including critical care time)  Medications Ordered in ED Medications  gi cocktail (Maalox,Lidocaine,Donnatal) (30 mLs Oral Given 04/13/16 0026)     Initial Impression / Assessment and Plan / ED Course  I have reviewed the triage vital signs and the nursing notes.  Pertinent labs & imaging results that were available during my care of the patient were reviewed by me and considered in my medical decision making (see chart for details).  Clinical Course    1. Atypical chest pain Patient had a recent negative stress tests in June of this year. EKG without acute ischemic changes or evidence of pericarditis. Chest x-ray without evidence suggestive of pneumonia, pneumothorax, pneumomediastinum.  No abnormal contour of the mediastinum to suggest dissection. No evidence of acute injuries. Appropriate for delta troponins. Troponins negative 2 ruling out ACS. Unable  to PE RC, d-dimer negative; low suspicion for pulmonary embolism. Presentation is classic for aortic dissection or esophageal perforation. Improved symptoms with GI cocktail.  2. Hematuria/right flank pain Patient with known 3 mm nonobstructing right renal stone. UA confirming hematuria without evidence of infection. Ultrasound without evidence of hydronephrosis. Ultrasound also noted that the retained stone. Patient has been seen by urology without any further workup. Patient requested referral to another urologist. Patient is status post cholecystectomy who had serous fluid collection noted on a CT scan in August without further complications. Patient is well-appearing, well-hydrated nontoxic. Labs are reassuring. No indication for additional imaging at this time. Low suspicion for infected serous fluid.   The patient is safe for discharge with strict return precautions.   Final Clinical Impressions(s) / ED Diagnoses   Final diagnoses:  Atypical chest pain  Right flank pain  Hematuria, unspecified type   Disposition: Discharge  Condition: Good  I have discussed the results, Dx and Tx plan with the patient who expressed understanding and agree(s) with the plan. Discharge instructions discussed at great length. The patient was given strict return precautions who verbalized understanding of the instructions. No further questions at time of discharge.    Discharge Medication List as of 04/13/2016 12:05 AM      Follow Up: Maren Reamer, MD [place] [address] [phone]  Schedule an appointment as soon as possible for a visit  As needed      Fatima Blank, MD 04/13/16 0040

## 2016-04-12 NOTE — Progress Notes (Signed)
Patient noted to have been seen in the ED 15 times within the last six months.  Patient has Medicaid insurance [place] [employer].  Pcp listed on patient's insurance card is located at the [facility]. [place] [phone].

## 2016-04-13 NOTE — ED Notes (Signed)
Patient verbalizes understanding of discharge instructions, home care and follow up care. Patient out of department at this time with family. 

## 2016-04-14 ENCOUNTER — Encounter: Payer: Self-pay | Admitting: Cardiology

## 2016-04-18 MED FILL — MONTELUKAST SOD 10 MG TAB: 10 | 30 days supply | Qty: 30 | Fill #2

## 2016-04-19 ENCOUNTER — Other Ambulatory Visit: Payer: Self-pay

## 2016-04-19 ENCOUNTER — Other Ambulatory Visit: Payer: Self-pay | Admitting: Pediatrics

## 2016-04-19 DIAGNOSIS — R05 Cough: Secondary | ICD-10-CM

## 2016-04-19 DIAGNOSIS — R059 Cough, unspecified: Secondary | ICD-10-CM

## 2016-04-19 DIAGNOSIS — K5900 Constipation, unspecified: Secondary | ICD-10-CM

## 2016-04-27 ENCOUNTER — Ambulatory Visit (HOSPITAL_COMMUNITY): Payer: Self-pay | Admitting: Psychiatry

## 2016-04-29 ENCOUNTER — Telehealth: Payer: Self-pay | Admitting: Physician Assistant

## 2016-04-29 ENCOUNTER — Encounter (HOSPITAL_COMMUNITY): Payer: Self-pay

## 2016-04-29 ENCOUNTER — Telehealth: Payer: Self-pay | Admitting: Cardiology

## 2016-04-29 ENCOUNTER — Encounter: Payer: Self-pay | Admitting: Physician Assistant

## 2016-04-29 ENCOUNTER — Emergency Department (HOSPITAL_COMMUNITY): Payer: Medicaid Other

## 2016-04-29 ENCOUNTER — Ambulatory Visit (INDEPENDENT_AMBULATORY_CARE_PROVIDER_SITE_OTHER): Payer: Medicaid Other | Admitting: Physician Assistant

## 2016-04-29 ENCOUNTER — Emergency Department (HOSPITAL_COMMUNITY)
Admission: EM | Admit: 2016-04-29 | Discharge: 2016-04-29 | Disposition: A | Discharge status: Home / Self Care | Payer: Medicaid Other | Attending: Emergency Medicine | Admitting: Emergency Medicine

## 2016-04-29 VITALS — BP 134/76 | HR 86 | Ht 62.0 in | Wt 172.8 lb

## 2016-04-29 DIAGNOSIS — R072 Precordial pain: Secondary | ICD-10-CM | POA: Diagnosis not present

## 2016-04-29 DIAGNOSIS — R0789 Other chest pain: Secondary | ICD-10-CM | POA: Diagnosis not present

## 2016-04-29 DIAGNOSIS — I10 Essential (primary) hypertension: Secondary | ICD-10-CM | POA: Diagnosis not present

## 2016-04-29 DIAGNOSIS — Z87891 Personal history of nicotine dependence: Secondary | ICD-10-CM | POA: Diagnosis not present

## 2016-04-29 DIAGNOSIS — R42 Dizziness and giddiness: Secondary | ICD-10-CM | POA: Insufficient documentation

## 2016-04-29 DIAGNOSIS — R079 Chest pain, unspecified: Secondary | ICD-10-CM

## 2016-04-29 LAB — BASIC METABOLIC PANEL
ANION GAP: 10 (ref 5–15)
BUN: 5 mg/dL — ABNORMAL LOW (ref 6–20)
CHLORIDE: 104 mmol/L (ref 101–111)
CO2: 24 mmol/L (ref 22–32)
Calcium: 9.4 mg/dL (ref 8.9–10.3)
Creatinine, Ser: 0.72 mg/dL (ref 0.44–1.00)
GFR calc Af Amer: >60 mL/min (ref >60)
GLUCOSE: 101 mg/dL — AB (ref 65–99)
POTASSIUM: 2.9 mmol/L — AB (ref 3.5–5.1)
Sodium: 138 mmol/L (ref 135–145)

## 2016-04-29 LAB — CBC
HEMATOCRIT: 39.5 % (ref 36.0–46.0)
HEMOGLOBIN: 13.5 g/dL (ref 12.0–15.0)
MCH: 30 pg (ref 26.0–34.0)
MCHC: 34.2 g/dL (ref 30.0–36.0)
MCV: 87.8 fL (ref 78.0–100.0)
Platelets: 314 10*3/uL (ref 150–400)
RBC: 4.5 MIL/uL (ref 3.87–5.11)
RDW: 12.6 % (ref 11.5–15.5)
WBC: 7.1 10*3/uL (ref 4.0–10.5)

## 2016-04-29 LAB — URINALYSIS, ROUTINE W REFLEX MICROSCOPIC
Bilirubin Urine: NEGATIVE
Glucose, UA: NEGATIVE mg/dL
Hgb urine dipstick: NEGATIVE
Ketones, ur: NEGATIVE mg/dL
LEUKOCYTES UA: NEGATIVE
Nitrite: NEGATIVE
PROTEIN: NEGATIVE mg/dL
SPECIFIC GRAVITY, URINE: 1.005 (ref 1.005–1.030)
pH: 6.5 (ref 5.0–8.0)

## 2016-04-29 LAB — I-STAT TROPONIN, ED: Troponin i, poc: 0 ng/mL (ref 0.00–0.08)

## 2016-04-29 LAB — TROPONIN I

## 2016-04-29 MED ORDER — ESOMEPRAZOLE MAGNESIUM 40 MG PO CPDR: 40.0000 mg | DELAYED_RELEASE_CAPSULE | Freq: Two times a day (BID) | ORAL | 2 refills | Status: DC

## 2016-04-29 NOTE — Addendum Note (Signed)
Addended by: Michae Kava on: 04/29/2016 01:17 PM   Modules accepted: Orders

## 2016-04-29 NOTE — Telephone Encounter (Signed)
Spoke with pt, she went to urgent care yesterday with chest pain. She describes the pain as a tightness in the chest. It is different from her previous GI related pain. It comes and goes and gets worse with movement or palpitation. She also reports just not feeling well for the last several days. She has dizziness when standing from lying or sitting occ. Explained to patient it does not sound related to her heart that she will need to contact her medical doctor. She reports talking to the medical doctor this morning and they told her she needed to see cardiology. Discussed with [facility]. Patient to be here at 11:30 am today to be seen.

## 2016-04-29 NOTE — Progress Notes (Signed)
Cardiology Office Note:    Date:  04/29/2016   ID:  Christina Fox, DOB November 29, 1980, MRN [id number]  PCP:  Darden Amber, PA  Cardiologist:  Dr. Allegra Lai   Electrophysiologist:  n/a  Referring MD: Maren Reamer, MD   Chief Complaint  Patient presents with  . Chest Pain    History of Present Illness:    Christina Fox is a 35 y.o. female with a hx of anxiety. Her blood pressure has been elevated in the past but she has not been put on medications.   Evaluated by Dr. Allegra Lai in 6/17 for chest pain.  Exercise Myoview did demonstrated lateral ST depression with exercise but her nuclear images were normal and the study was felt to be low risk.  She was seen at urgent care yesterday with chest pain and was asked to FU here with Cardiology today.  She is here with her daughter.  She has had a lot of different types of chest pain since the summer.  She did have a cholecystectomy after seeing Dr. Curt Bears and her symptoms did improve.  She did go to [facility] about a month ago with chest discomfort.  Her workup was negative at that time. Her antireflux regimen was adjusted. She developed chest discomfort again yesterday. This is a pressure or tightness sensation. She notes it with positional changes. She denies pleuritic chest pain or chest pain with lying supine. She does note some symptoms with eating. She has mild dysphagia as well as pill odynophagia. She had right-sided symptoms yesterday. It has moved over to the left today. She has had some back discomfort but does not feel that her symptoms are radiating. She has been more short of breath especially with going up steps at home. She has felt nauseated at times. She denies syncope. She denies orthopnea, PND or edema.  Prior CV studies that were reviewed today include:    ETT-Myoview 6/17 Ex 7'; horizontal ST depression in V4-6; EF 59%; normal perfusion; Low Risk  Past Medical History:  Diagnosis Date  .  Abdominal pain    "right lower quadrant pain and right lower back pain  . Anxiety   . Deviated septum 09/2011  . GERD (gastroesophageal reflux disease)   . Headache(784.0)    sinus  . Hypertension    no meds  . Kidney stones    no current problem  . Migraine   . Nasal turbinate hypertrophy 09/2011   bilat.  . Pelvic inflammatory disease     Past Surgical History:  Procedure Laterality Date  . CHOLECYSTECTOMY N/A 01/15/2016   Procedure: LAPAROSCOPIC CHOLECYSTECTOMY WITH INTRAOPERATIVE CHOLANGIOGRAM;  Surgeon: Christene Lye, MD;  Location: [facility];  Service: General;  Laterality: N/A;  . COLONOSCOPY  2007   [facility]: pedunculated 33mm polyp in distal sigmoid, sessile 40mm polyp at splenic flexure, larger polyp tubulovillous adenoma and smaller polyp tubular adenoma   . COLONOSCOPY  09-09-15  . COLONOSCOPY WITH PROPOFOL N/A 03/01/2016   Procedure: COLONOSCOPY WITH PROPOFOL;  Surgeon: Garlan Fair, MD;  Location: [facility];  Service: Endoscopy;  Laterality: N/A;  . NASAL SEPTOPLASTY W/ TURBINOPLASTY  10/04/2011   Procedure: NASAL SEPTOPLASTY WITH TURBINATE REDUCTION;  Surgeon: Ascencion Dike, MD;  Location: [place];  Service: ENT;  Laterality: Bilateral;  . WISDOM TOOTH EXTRACTION  2009    Current Medications: Current Meds  Medication Sig  . clonazePAM (KLONOPIN) 0.5 MG tablet Take 1 tablet (0.5 mg total)  by mouth daily. (Patient taking differently: Take 0.25 mg by mouth 2 (two) times daily. )  . fluticasone (FLONASE) 50 MCG/ACT nasal spray Place 2 sprays into both nostrils daily.  Marland Kitchen ibuprofen (ADVIL,MOTRIN) 800 MG tablet Take 1 tablet (800 mg total) by mouth 3 (three) times daily.  Marland Kitchen levonorgestrel-ethinyl estradiol (AUBRA) 0.1-20 MG-MCG tablet Take 1 tablet by mouth daily. Reported on 12/04/2015  . montelukast (SINGULAIR) 10 MG tablet TAKE 1 TABLET BY MOUTH DAILY  . potassium chloride SA (K-DUR,KLOR-CON) 20 MEQ tablet Take 20 mEq by mouth 2 (two) times a week.    . Prenat w/o A-FeCbn-DSS-FA-DHA (CITRANATAL HARMONY) 29-1-265 MG CAPS Take 1 capsule by mouth daily before breakfast.  . [DISCONTINUED] esomeprazole (NEXIUM) 40 MG capsule Take 40 mg by mouth daily.      Allergies:   Clarithromycin; Bentyl [dicyclomine hcl]; Haldol [haloperidol]; Meclizine; Morphine and related; Toradol [ketorolac tromethamine]; Macrobid [nitrofurantoin monohyd macro]; and Sulfa antibiotics   Social History   Social History  . Marital status: Single    Spouse name: N/A  . Number of children: 4  . Years of education: 91   Occupational History  . 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 Smoking status: Former Smoker    Packs/day: 0.25    Years: 10.00    Quit date: 08/23/2008  . Smokeless tobacco: Never Used  . Alcohol use No  . Drug use: No  . Sexual activity: Not Currently    Birth control/ protection: Pill   Other Topics Concern  . None   Social History Narrative   Lives at home w/ her children   Right-handed   Drinks occasional sweet tea     Family History:  The patient's family history includes Colon cancer in her paternal grandfather; Fibromyalgia in her mother; Hypertension in her father, mother, and other; Kidney cancer in her mother; Liver disease in her father and mother; Migraines in her maternal grandmother and mother; Multiple sclerosis in her mother; Spina bifida in her brother.   ROS:   Please see the history of present illness.    Review of Systems  Cardiovascular: Positive for chest pain.  Gastrointestinal: Positive for diarrhea.  Neurological: Positive for dizziness.   All other systems reviewed and are negative.   EKGs/Labs/Other Test Reviewed:    EKG:  EKG is  ordered today.  The ekg ordered today demonstrates NSR, HR 85, normal axis, inf-lat ST changes (ST flattening/TWI), similar to prior tracings; sl more prominent today  Recent Labs: 10/27/2015: TSH 1.38 12/04/2015: Magnesium 2.1 03/29/2016: ALT  21 04/12/2016: BUN 7; Creatinine, Ser 0.68; Hemoglobin 13.9; Platelets 316; Potassium 3.5; Sodium 139   Recent Lipid Panel    Component Value Date/Time   CHOL 153 12/22/2015 1646   TRIG 99 12/22/2015 1646   HDL 59 12/22/2015 1646   CHOLHDL 2.6 12/22/2015 1646   VLDL 20 12/22/2015 1646   LDLCALC 74 12/22/2015 1646     Physical Exam:    VS:  BP 134/76   Pulse 86   Ht 5\' 2"  (1.575 m)   Wt 172 lb 12.8 oz (78.4 kg)   BMI 31.61 kg/m     Wt Readings from Last 3 Encounters:  04/29/16 172 lb 12.8 oz (78.4 kg)  04/12/16 176 lb 11.2 oz (80.2 kg)  03/29/16 178 lb (80.7 kg)     Physical Exam  Constitutional: She is oriented to person, place, and time. She appears well-developed and well-nourished. No distress.  HENT:  Head: Normocephalic and atraumatic.  Eyes: No scleral icterus.  Neck: No JVD present. Carotid bruit is not present.  Cardiovascular: Normal rate, regular rhythm, S1 normal, S2 normal and normal heart sounds.  Exam reveals no friction rub.   No murmur heard. Pulmonary/Chest: Effort normal. She has no wheezes. She has no rhonchi.  Abdominal: Normal appearance. There is no hepatomegaly. There is no tenderness.  Musculoskeletal: She exhibits no edema.  Neurological: She is alert and oriented to person, place, and time.  Skin: Skin is warm and dry.  Psychiatric: She has a normal mood and affect.    ASSESSMENT:    1. Precordial pain    PLAN:    In order of problems listed above:  1. Chest pain - She has virtually no cardiac risk factors.  She carries a dx of HTN but is not on medication and has a normal BP. Her chest pain features are typical and atypical.  However, I can reproduce symptoms with palpation, making MSK chest pain more likely.  She also has a hx of GERD and has some GI symptoms as well.  She had a normal nuclear perfusion study in 6/17.  Overall, I doubt her symptoms are cardiac.  However, her baseline ECG is abnormal which makes it difficult to  interpret.  She has had chest pain since yesterday.  A negative Troponin will rule out ischemia.    -  Increase Nexium to 40 bid  -  Take Ibuprofen 800 3x/day with food x 1 week  -  Get Troponin today.  If abnormal, she will need to go to the ED.  -  Arrange Echocardiogram.  -  FU prn   Medication Adjustments/Labs and Tests Ordered: Current medicines are reviewed at length with the patient today.  Concerns regarding medicines are outlined above.  Medication changes, Labs and Tests ordered today are outlined in the Patient Instructions noted below. Patient Instructions  Medication Instructions:  1. TAKE IBUPROFEN 800 MG THREE TIMES DAILY WITH FOOD FOR 1 WEEK THEN STOP 2. INCREASE NEXIUM TO 40 MG TWICE DAILY  Labwork: STAT TROPONIN TODAY  Testing/Procedures: Your physician has requested that you have an echocardiogram. Echocardiography is a painless test that uses sound waves to create images of your heart. It provides your doctor with information about the size and shape of your heart and how well your heart's chambers and valves are working. This procedure takes approximately one hour. There are no restrictions for this procedure.  Follow-Up: DR. Curt Bears AS NEEDED AT THIS TIME  Any Other Special Instructions Will Be Listed Below (If Applicable).  If you need a refill on your cardiac medications before your next appointment, please call your pharmacy.  Signed, Richardson Dopp, PA-C  04/29/2016 1:05 PM    [facility] [address], [place], [place]  [address] Phone: [phone]; Fax: [phone]

## 2016-04-29 NOTE — Addendum Note (Signed)
Addended by: Eulis Foster on: 04/29/2016 01:29 PM   Modules accepted: Orders

## 2016-04-29 NOTE — ED Provider Notes (Signed)
[place] DEPT Provider Note   CSN: [id number] Arrival date & time: 04/29/16  1951   By signing my name below, I, Doran Stabler, attest that this documentation has been prepared under the direction and in the presence of [name], PA-C. Electronically Signed: Doran Stabler, ED Scribe. 04/29/16. 8:21 PM   History   Chief Complaint Chief Complaint  Patient presents with  . Chest Pain    The history is provided by the patient. No language interpreter was used.   HPI Comments: Christina Fox is a 35 y.o. female who presents to the Emergency Department with a PMHx of HTN and GERD complaining of constant dull centralized chest pain that began yesterday. Her pain radiates to the posterior aspect of her left shoulder. Pt also reports associated dizziness. Pt was seen at Urgent Care for her symptoms and was referred to a cardiologist who she saw today. There, he performed an EKG and checked her troponin. She then scheduled an [place] of her heart on 05/17/16. Later in the day, her pain gradually worsened to the point and started to affect her breathing so she came to the ED for further evaluation. Pt denies any fevers, chills, N/V/D or any other symptoms at this time.   While at the Urgent Care, pt was dx with a UTI.  Past Medical History:  Diagnosis Date  . Abdominal pain    "right lower quadrant pain and right lower back pain  . Anxiety   . Deviated septum 09/2011  . GERD (gastroesophageal reflux disease)   . Headache(784.0)    sinus  . Hypertension    no meds  . Kidney stones    no current problem  . Migraine   . Nasal turbinate hypertrophy 09/2011   bilat.  . Pelvic inflammatory disease     Patient Active Problem List   Diagnosis Date Noted  . Gallstone 01/07/2016  . History of colonic polyps 11/27/2015  . Right lower quadrant abdominal pain 11/27/2015  . HTN (hypertension), benign 11/24/2015  . Abdominal pain 11/16/2015  . Headache 10/13/2015  . Thyromegaly  11/03/2014  . Anxiety disorder 11/03/2014    Past Surgical History:  Procedure Laterality Date  . CHOLECYSTECTOMY N/A 01/15/2016   Procedure: LAPAROSCOPIC CHOLECYSTECTOMY WITH INTRAOPERATIVE CHOLANGIOGRAM;  Surgeon: Christene Lye, MD;  Location: [facility];  Service: General;  Laterality: N/A;  . COLONOSCOPY  2007   [facility]: pedunculated 23mm polyp in distal sigmoid, sessile 56mm polyp at splenic flexure, larger polyp tubulovillous adenoma and smaller polyp tubular adenoma   . COLONOSCOPY  09-09-15  . COLONOSCOPY WITH PROPOFOL N/A 03/01/2016   Procedure: COLONOSCOPY WITH PROPOFOL;  Surgeon: Garlan Fair, MD;  Location: [facility];  Service: Endoscopy;  Laterality: N/A;  . NASAL SEPTOPLASTY W/ TURBINOPLASTY  10/04/2011   Procedure: NASAL SEPTOPLASTY WITH TURBINATE REDUCTION;  Surgeon: Ascencion Dike, MD;  Location: [place];  Service: ENT;  Laterality: Bilateral;  . WISDOM TOOTH EXTRACTION  2009   OB History    Gravida Para Term Preterm AB Living   4 4 4     4    SAB TAB Ectopic Multiple Live Births                  Obstetric Comments   1st Menstrual Cycle:  10  1st Pregnancy:  16      Home Medications    Prior to Admission medications   Medication Sig Start Date End Date Taking? Authorizing Provider  clonazePAM (KLONOPIN) 0.5  MG tablet Take 1 tablet (0.5 mg total) by mouth daily. Patient taking differently: Take 0.25 mg by mouth 2 (two) times daily.  10/20/15  Yes Robyn Haber, MD  esomeprazole (NEXIUM) 40 MG capsule Take 1 capsule (40 mg total) by mouth 2 (two) times daily before a meal. 04/29/16  Yes Scott T Kathlen Mody, PA-C  fluticasone (FLONASE) 50 MCG/ACT nasal spray Place 2 sprays into both nostrils daily. Patient taking differently: Place 2 sprays into both nostrils daily as needed for allergies.  12/22/15  Yes Maren Reamer, MD  ibuprofen (ADVIL,MOTRIN) 800 MG tablet Take 1 tablet (800 mg total) by mouth 3 (three) times daily. 03/05/16  Yes Keitha [place],  CNM  levonorgestrel-ethinyl estradiol (AUBRA) 0.1-20 MG-MCG tablet Take 1 tablet by mouth daily. Reported on 12/04/2015   Yes Historical Provider, MD  montelukast (SINGULAIR) 10 MG tablet TAKE 1 TABLET BY MOUTH DAILY Patient taking differently: TAKE 10mg  TABLET BY MOUTH DAILY 01/18/16  Yes Maren Reamer, MD  Prenat w/o A-FeCbn-DSS-FA-DHA (CITRANATAL HARMONY) 29-1-265 MG CAPS Take 1 capsule by mouth daily before breakfast. Patient taking differently: Take 1 capsule by mouth 3 (three) times a week.  03/29/16  Yes Shelly Bombard, MD  potassium chloride SA (K-DUR,KLOR-CON) 20 MEQ tablet Take 20 mEq by mouth 2 (two) times a week.    Historical Provider, MD    Family History Family History  Problem Relation Age of Onset  . Kidney cancer Mother   . Hypertension Mother   . Migraines Mother   . Multiple sclerosis Mother   . Fibromyalgia Mother   . Liver disease Mother     NASH   . Hypertension Father   . Liver disease Father     liver transplant; had fatty liver disease  . Spina bifida Brother   . Migraines Maternal Grandmother   . Hypertension Other   . Colon cancer Paternal Grandfather     Social History Social History  Substance Use Topics  . Smoking status: Former Smoker    Packs/day: 0.25    Years: 10.00    Quit date: 08/23/2008  . Smokeless tobacco: Never Used  . Alcohol use No    Allergies   Clarithromycin; Bentyl [dicyclomine hcl]; Haldol [haloperidol]; Meclizine; Morphine and related; Toradol [ketorolac tromethamine]; Macrobid [nitrofurantoin monohyd macro]; and Sulfa antibiotics  Review of Systems Review of Systems  Constitutional: Negative for chills and fever.  HENT: Negative.   Eyes: Negative.  Negative for visual disturbance.  Respiratory: Positive for cough. Negative for shortness of breath.   Cardiovascular: Positive for chest pain. Negative for leg swelling.  Gastrointestinal: Negative for abdominal pain, diarrhea, nausea and vomiting.  Genitourinary:  Negative.  Negative for dysuria.  Musculoskeletal: Negative.  Negative for myalgias and neck pain.  Neurological: Positive for dizziness. Negative for syncope, weakness and headaches.     Physical Exam Updated Vital Signs BP 157/90   Pulse 60   Temp 98.2 F (36.8 C) (Oral)   Resp 16   Ht 5\' 2"  (1.575 m)   Wt 78 kg   SpO2 100%   BMI 31.46 kg/m   Physical Exam  Constitutional: She is oriented to person, place, and time. She appears well-developed and well-nourished.  HENT:  Head: Normocephalic.  Eyes: Conjunctivae are normal.  Cardiovascular: Normal rate, regular rhythm and normal heart sounds.   Pulmonary/Chest: Effort normal and breath sounds normal. She has no wheezes. She has no rales.  Abdominal: There is no tenderness.  Musculoskeletal: She exhibits no edema.  Chest wall tenderness to the sternal and left chest  Neurological: She is alert and oriented to person, place, and time.  Skin: Skin is warm and dry.  Psychiatric: She has a normal mood and affect.  Nursing note and vitals reviewed.  ED Treatments / Results  DIAGNOSTIC STUDIES: Oxygen Saturation is 98% on room air, normal by my interpretation.    COORDINATION OF CARE: 8:30 PM Discussed treatment plan with pt at bedside and pt agreed to plan.  Labs (all labs ordered are listed, but only abnormal results are displayed) Labs Reviewed  BASIC METABOLIC PANEL - Abnormal; Notable for the following:       Result Value   Potassium 2.9 (*)    Glucose, Bld 101 (*)    BUN 5 (*)    All other components within normal limits  CBC  URINALYSIS, ROUTINE W REFLEX MICROSCOPIC (NOT AT [facility])  I-STAT TROPOININ, ED   Results for orders placed or performed during the hospital encounter of [id number]  Basic metabolic panel  Result Value Ref Range   Sodium 138 135 - 145 mmol/L   Potassium 2.9 (L) 3.5 - 5.1 mmol/L   Chloride 104 101 - 111 mmol/L   CO2 24 22 - 32 mmol/L   Glucose, Bld 101 (H) 65 - 99 mg/dL   BUN 5 (L) 6 - 20  mg/dL   Creatinine, Ser 0.72 0.44 - 1.00 mg/dL   Calcium 9.4 8.9 - 10.3 mg/dL   GFR calc non Af Amer >60 >60 mL/min   GFR calc Af Amer >60 >60 mL/min   Anion gap 10 5 - 15  CBC  Result Value Ref Range   WBC 7.1 4.0 - 10.5 K/uL   RBC 4.50 3.87 - 5.11 MIL/uL   Hemoglobin 13.5 12.0 - 15.0 g/dL   HCT 39.5 36.0 - 46.0 %   MCV 87.8 78.0 - 100.0 fL   MCH 30.0 26.0 - 34.0 pg   MCHC 34.2 30.0 - 36.0 g/dL   RDW 12.6 11.5 - 15.5 %   Platelets 314 150 - 400 K/uL  Urinalysis, Routine w reflex microscopic  Result Value Ref Range   Color, Urine YELLOW YELLOW   APPearance CLEAR CLEAR   Specific Gravity, Urine 1.005 1.005 - 1.030   pH 6.5 5.0 - 8.0   Glucose, UA NEGATIVE NEGATIVE mg/dL   Hgb urine dipstick NEGATIVE NEGATIVE   Bilirubin Urine NEGATIVE NEGATIVE   Ketones, ur NEGATIVE NEGATIVE mg/dL   Protein, ur NEGATIVE NEGATIVE mg/dL   Nitrite NEGATIVE NEGATIVE   Leukocytes, UA NEGATIVE NEGATIVE  I-stat troponin, ED  Result Value Ref Range   Troponin i, poc 0.00 0.00 - 0.08 ng/mL   Comment 3            EKG  EKG Interpretation None       Radiology Dg Chest 2 View  Result Date: 04/29/2016 CLINICAL DATA:  Central chest pain radiating down the left arm EXAM: CHEST  2 VIEW COMPARISON:  04/12/2016 FINDINGS: The heart size and mediastinal contours are within normal limits. Both lungs are clear. The visualized skeletal structures are unremarkable. IMPRESSION: No active cardiopulmonary disease. Electronically Signed   By: Donavan Foil M.D.   On: 04/29/2016 21:16    Procedures Procedures (including critical care time)  Medications Ordered in ED Medications - No data to display   Initial Impression / Assessment and Plan / ED Course  I have reviewed the triage vital signs and the nursing notes.  Pertinent labs & imaging  results that were available during my care of the patient were reviewed by me and considered in my medical decision making (see chart for details).  Clinical Course      Patient returns for urgent evaluation of chest pain, cough, with report of onset dizziness today. No syncope. She is under care of cardiology and was last seen today with reported negative EKG, troponin and scheduled for echo.   She appears well. There was some question of UTI that is not found today on UA. Labs and imaging here are unremarkable. She is stable for discharge home.   Final Clinical Impressions(s) / ED Diagnoses   Final diagnoses:  None   1. Chest pain 2. dizziness New Prescriptions New Prescriptions   No medications on file   I personally performed the services described in this documentation, which was scribed in my presence. The recorded information has been reviewed and is accurate.     [name], PA-C [id number]    Delora Fuel, MD [phone]

## 2016-04-29 NOTE — ED Triage Notes (Signed)
Patient states that has has chest pain since yesterday that has been in her central chest and radiating down her left arm.  Patient has been dizzy, diaphoretic and SOB today and feels like she is going to black out.  States she has an appointment scheduled at the cardiologist for next week.  Patient to room 16.

## 2016-04-29 NOTE — Patient Instructions (Addendum)
Medication Instructions:  1. TAKE IBUPROFEN 800 MG THREE TIMES DAILY WITH FOOD FOR 1 WEEK THEN STOP 2. INCREASE NEXIUM TO 40 MG TWICE DAILY  Labwork: STAT TROPONIN TODAY  Testing/Procedures: Your physician has requested that you have an echocardiogram. Echocardiography is a painless test that uses sound waves to create images of your heart. It provides your doctor with information about the size and shape of your heart and how well your heart's chambers and valves are working. This procedure takes approximately one hour. There are no restrictions for this procedure.  Follow-Up: DR. Curt Bears AS NEEDED AT THIS TIME  Any Other Special Instructions Will Be Listed Below (If Applicable).  If you need a refill on your cardiac medications before your next appointment, please call your pharmacy.

## 2016-04-29 NOTE — Addendum Note (Signed)
Addended by: Eulis Foster on: 04/29/2016 01:28 PM   Modules accepted: Orders

## 2016-04-29 NOTE — Telephone Encounter (Signed)
Pt went to [facility] pressure-SOB They did EKG -told to take Ibuprofen but not helping-requesting appt today-pls call  [phone]

## 2016-04-29 NOTE — Telephone Encounter (Signed)
Pt notified of Troponin results normal.

## 2016-04-29 NOTE — Telephone Encounter (Signed)
Pt is calling to get test results from today

## 2016-05-05 ENCOUNTER — Telehealth: Payer: Self-pay

## 2016-05-05 NOTE — Telephone Encounter (Signed)
Prior auth obtained for Esomeprazole 40mg  bid from [employer]. [place] - [id number] is good for 1 year.

## 2016-05-10 ENCOUNTER — Encounter (HOSPITAL_COMMUNITY): Payer: Self-pay | Admitting: Emergency Medicine

## 2016-05-10 ENCOUNTER — Emergency Department (HOSPITAL_COMMUNITY)
Admission: EM | Admit: 2016-05-10 | Discharge: 2016-05-10 | Disposition: A | Discharge status: Home / Self Care | Payer: Medicaid Other | Attending: Emergency Medicine | Admitting: Emergency Medicine

## 2016-05-10 DIAGNOSIS — J069 Acute upper respiratory infection, unspecified: Secondary | ICD-10-CM | POA: Diagnosis not present

## 2016-05-10 DIAGNOSIS — Z87891 Personal history of nicotine dependence: Secondary | ICD-10-CM | POA: Diagnosis not present

## 2016-05-10 DIAGNOSIS — Z79899 Other long term (current) drug therapy: Secondary | ICD-10-CM | POA: Insufficient documentation

## 2016-05-10 DIAGNOSIS — R0981 Nasal congestion: Secondary | ICD-10-CM | POA: Diagnosis present

## 2016-05-10 DIAGNOSIS — I10 Essential (primary) hypertension: Secondary | ICD-10-CM | POA: Diagnosis not present

## 2016-05-10 LAB — CBC WITH DIFFERENTIAL/PLATELET
Basophils Absolute: 0 10*3/uL (ref 0.0–0.1)
Basophils Relative: 1 %
EOS ABS: 0.1 10*3/uL (ref 0.0–0.7)
EOS PCT: 2 %
HCT: 37.2 % (ref 36.0–46.0)
Hemoglobin: 12.6 g/dL (ref 12.0–15.0)
LYMPHS ABS: 1.3 10*3/uL (ref 0.7–4.0)
LYMPHS PCT: 21 %
MCH: 29.3 pg (ref 26.0–34.0)
MCHC: 33.9 g/dL (ref 30.0–36.0)
MCV: 86.5 fL (ref 78.0–100.0)
MONO ABS: 0.4 10*3/uL (ref 0.1–1.0)
MONOS PCT: 7 %
Neutro Abs: 4.3 10*3/uL (ref 1.7–7.7)
Neutrophils Relative %: 69 %
PLATELETS: 303 10*3/uL (ref 150–400)
RBC: 4.3 MIL/uL (ref 3.87–5.11)
RDW: 12.5 % (ref 11.5–15.5)
WBC: 6.2 10*3/uL (ref 4.0–10.5)

## 2016-05-10 NOTE — ED Notes (Signed)
Post triage pt reports urinary frequency but verbalizes seen by urology this morning and told "okay but blood in urine." Provider in minor care made aware and states will see pt in minor care. Pt using nearby restroom at present time and collecting urine sample if order needed.

## 2016-05-10 NOTE — ED Provider Notes (Signed)
[facility] DEPT Provider Note   CSN: [id number] Arrival date & time: 05/10/16  1352   By signing my name below, I, Neta Mends, attest that this documentation has been prepared under the direction and in the presence of Etta Quill, NP. Electronically Signed: Neta Mends, ED Scribe. 05/10/2016. 2:54 PM.   History   Chief Complaint Chief Complaint  Patient presents with  . Chills  . Nasal Congestion    The history is provided by the patient. No language interpreter was used.   HPI Comments:  Christina Fox is a 35 y.o. female who presents to the Emergency Department complaining of multiple constant URI symptoms x 4 days. Pt complains of associated rhinorrhea, chills, sore throat, nasal congestion, cough, dizziness, weakness, diaphoresis, and fatigue. Pt states that the chills, diaphoresis, and weakness worsened while at work today. Pt was seen by UC 2 days ago and was diagnosed with sinusitis. Urinalysis today at urologist's office showed trace blood, nitrites and small leukocyte esterase. Pt has been taking 500mg  amoxicillin twice a day without relief. Pt denies any general pains.      Past Medical History:  Diagnosis Date  . Abdominal pain    "right lower quadrant pain and right lower back pain  . Anxiety   . Deviated septum 09/2011  . GERD (gastroesophageal reflux disease)   . Headache(784.0)    sinus  . Hypertension    no meds  . Kidney stones    no current problem  . Migraine   . Nasal turbinate hypertrophy 09/2011   bilat.  . Pelvic inflammatory disease     Patient Active Problem List   Diagnosis Date Noted  . Gallstone 01/07/2016  . History of colonic polyps 11/27/2015  . Right lower quadrant abdominal pain 11/27/2015  . HTN (hypertension), benign 11/24/2015  . Abdominal pain 11/16/2015  . Headache 10/13/2015  . 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 Anxiety disorder 11/03/2014    Past Surgical History:  Procedure Laterality Date  .  CHOLECYSTECTOMY N/A 01/15/2016   Procedure: LAPAROSCOPIC CHOLECYSTECTOMY WITH INTRAOPERATIVE CHOLANGIOGRAM;  Surgeon: Christene Lye, MD;  Location: [facility] ORS;  Service: General;  Laterality: N/A;  . COLONOSCOPY  2007   [facility]: pedunculated 57mm polyp in distal sigmoid, sessile 20mm polyp at splenic flexure, larger polyp tubulovillous adenoma and smaller polyp tubular adenoma   . COLONOSCOPY  09-09-15  . COLONOSCOPY WITH PROPOFOL N/A 03/01/2016   Procedure: COLONOSCOPY WITH PROPOFOL;  Surgeon: Garlan Fair, MD;  Location: [facility];  Service: Endoscopy;  Laterality: N/A;  . NASAL SEPTOPLASTY W/ TURBINOPLASTY  10/04/2011   Procedure: NASAL SEPTOPLASTY WITH TURBINATE REDUCTION;  Surgeon: Ascencion Dike, MD;  Location: [facility];  Service: ENT;  Laterality: Bilateral;  . WISDOM TOOTH EXTRACTION  2009    OB History    Gravida Para Term Preterm AB Living   4 4 4     4    SAB TAB Ectopic Multiple Live Births                  Obstetric Comments   1st Menstrual Cycle:  10  1st Pregnancy:  16        Home Medications    Prior to Admission medications   Medication Sig Start Date End Date Taking? Authorizing Provider  clonazePAM (KLONOPIN) 0.5 MG tablet Take 1 tablet (0.5 mg total) by mouth daily. Patient taking differently: Take 0.25 mg by mouth 2 (two) times daily.  10/20/15   Robyn Haber, MD  esomeprazole (NEXIUM) 40 MG capsule Take 1 capsule (40 mg total) by mouth 2 (two) times daily before a meal. 04/29/16   Scott T Kathlen Mody, PA-C  fluticasone (FLONASE) 50 MCG/ACT nasal spray Place 2 sprays into both nostrils daily. Patient taking differently: Place 2 sprays into both nostrils daily as needed for allergies.  12/22/15   Maren Reamer, MD  ibuprofen (ADVIL,MOTRIN) 800 MG tablet Take 1 tablet (800 mg total) by mouth 3 (three) times daily. 03/05/16   Keitha Butte, CNM  levonorgestrel-ethinyl estradiol (AUBRA) 0.1-20 MG-MCG tablet Take 1 tablet by mouth daily. Reported on  12/04/2015    Historical Provider, MD  montelukast (SINGULAIR) 10 MG tablet TAKE 1 TABLET BY MOUTH DAILY Patient taking differently: TAKE 10mg  TABLET BY MOUTH DAILY 01/18/16   Maren Reamer, MD  potassium chloride SA (K-DUR,KLOR-CON) 20 MEQ tablet Take 20 mEq by mouth 2 (two) times a week.    Historical Provider, MD  Prenat w/o A-FeCbn-DSS-FA-DHA (CITRANATAL HARMONY) 29-1-265 MG CAPS Take 1 capsule by mouth daily before breakfast. Patient taking differently: Take 1 capsule by mouth 3 (three) times a week.  03/29/16   Shelly Bombard, MD    Family History Family History  Problem Relation Age of Onset  . Kidney cancer Mother   . Hypertension Mother   . Migraines Mother   . Multiple sclerosis Mother   . Fibromyalgia Mother   . Liver disease Mother     NASH   . Hypertension Father   . Liver disease Father     liver transplant; had fatty liver disease  . Spina bifida Brother   . Migraines Maternal Grandmother   . Hypertension Other   . Colon cancer Paternal Grandfather     Social History Social History  Substance Use Topics  . Smoking status: Former Smoker    Packs/day: 0.25    Years: 10.00    Quit date: 08/23/2008  . Smokeless tobacco: Never Used  . Alcohol use No     Allergies   Clarithromycin; Bentyl [dicyclomine hcl]; Haldol [haloperidol]; Meclizine; Morphine and related; Toradol [ketorolac tromethamine]; Macrobid [nitrofurantoin monohyd macro]; and Sulfa antibiotics   Review of Systems Review of Systems  Constitutional: Positive for chills, diaphoresis and fatigue.  HENT: Positive for congestion, rhinorrhea and sore throat.   Respiratory: Positive for cough.   Musculoskeletal: Negative for myalgias.  Neurological: Positive for dizziness and weakness.  All other systems reviewed and are negative.    Physical Exam Updated Vital Signs BP 169/90 (BP Location: Right Arm)   Pulse 89   Temp 97.9 F (36.6 C) (Oral)   Resp 16   Ht 5\' 2"  (1.575 m)   Wt 174 lb (78.9  kg)   SpO2 100%   BMI 31.83 kg/m   Physical Exam  Constitutional: She appears well-developed and well-nourished. No distress.  HENT:  Head: Normocephalic and atraumatic.  Eyes: Conjunctivae are normal.  Cardiovascular: Normal rate, regular rhythm and normal heart sounds.   Pulmonary/Chest: Effort normal and breath sounds normal.  Abdominal: She exhibits no distension.  Neurological: She is alert.  Skin: Skin is warm and dry.  Psychiatric: She has a normal mood and affect.  Nursing note and vitals reviewed.    ED Treatments / Results  DIAGNOSTIC STUDIES:  Oxygen Saturation is 100% on RA, normal by my interpretation.    COORDINATION OF CARE:  2:54 PM Discussed treatment plan with pt at bedside and pt agreed to plan.   Labs (all labs ordered are listed,  but only abnormal results are displayed) Labs Reviewed - No data to display  EKG  EKG Interpretation None       Radiology No results found.  Procedures Procedures (including critical care time)  Medications Ordered in ED Medications - No data to display  Pt symptoms consistent with URI. Patient recently seen at urgent care, is on amoxicillin for sinusitis. Seen at urology today for persistent hematuria--UA results reviewed. Culture requested in ED. Pt is afebrile. No tachycardia, hypotension, or other signs of serious infection.  Pt will be discharged with symptomatic treatment.  Discussed return precautions.  Pt is hemodynamically stable & in NAD. Pt appears safe for discharge.  Initial Impression / Assessment and Plan / ED Course  I have reviewed the triage vital signs and the nursing notes.  Pertinent labs & imaging results that were available during my care of the patient were reviewed by me and considered in my medical decision making (see chart for details).  Clinical Course       Final Clinical Impressions(s) / ED Diagnoses   Final diagnoses:  Viral upper respiratory tract infection    New  Prescriptions Discharge Medication List as of 05/10/2016  3:43 PM      I personally performed the services described in this documentation, which was scribed in my presence. The recorded information has been reviewed and is accurate.    Etta Quill, NP 05/10/16 [place], MD 05/11/16 2017

## 2016-05-10 NOTE — ED Triage Notes (Signed)
Pt complaint of continued runny nose, chills, and fatigue since this weekend. Seen by urgent care Sunday and diagnosed with sinusitis. Pt denies pain.

## 2016-05-11 LAB — URINE CULTURE

## 2016-05-17 ENCOUNTER — Ambulatory Visit (HOSPITAL_COMMUNITY): Payer: Medicaid Other | Attending: Cardiovascular Disease

## 2016-05-17 ENCOUNTER — Encounter: Payer: Self-pay | Admitting: Physician Assistant

## 2016-05-17 ENCOUNTER — Other Ambulatory Visit: Payer: Self-pay

## 2016-05-17 DIAGNOSIS — R079 Chest pain, unspecified: Secondary | ICD-10-CM | POA: Diagnosis present

## 2016-05-17 DIAGNOSIS — I10 Essential (primary) hypertension: Secondary | ICD-10-CM | POA: Diagnosis not present

## 2016-05-17 DIAGNOSIS — R072 Precordial pain: Secondary | ICD-10-CM | POA: Insufficient documentation

## 2016-05-17 DIAGNOSIS — Z87891 Personal history of nicotine dependence: Secondary | ICD-10-CM | POA: Diagnosis not present

## 2016-05-18 ENCOUNTER — Telehealth: Payer: Self-pay | Admitting: *Deleted

## 2016-05-18 NOTE — Telephone Encounter (Signed)
Pt has been notified of echo results by phone with verbal understanding 

## 2016-05-19 ENCOUNTER — Encounter (HOSPITAL_COMMUNITY): Payer: Self-pay

## 2016-05-19 ENCOUNTER — Emergency Department (HOSPITAL_COMMUNITY)
Admission: EM | Admit: 2016-05-19 | Discharge: 2016-05-19 | Disposition: A | Discharge status: Home / Self Care | Payer: Medicaid Other | Attending: Emergency Medicine | Admitting: Emergency Medicine

## 2016-05-19 ENCOUNTER — Emergency Department (HOSPITAL_COMMUNITY): Payer: Medicaid Other

## 2016-05-19 ENCOUNTER — Other Ambulatory Visit: Payer: Self-pay

## 2016-05-19 DIAGNOSIS — R0789 Other chest pain: Secondary | ICD-10-CM

## 2016-05-19 DIAGNOSIS — R51 Headache: Secondary | ICD-10-CM | POA: Diagnosis not present

## 2016-05-19 DIAGNOSIS — Z87891 Personal history of nicotine dependence: Secondary | ICD-10-CM | POA: Diagnosis not present

## 2016-05-19 DIAGNOSIS — J069 Acute upper respiratory infection, unspecified: Secondary | ICD-10-CM | POA: Diagnosis not present

## 2016-05-19 DIAGNOSIS — I10 Essential (primary) hypertension: Secondary | ICD-10-CM | POA: Diagnosis not present

## 2016-05-19 DIAGNOSIS — R519 Headache, unspecified: Secondary | ICD-10-CM

## 2016-05-19 DIAGNOSIS — Z79899 Other long term (current) drug therapy: Secondary | ICD-10-CM | POA: Insufficient documentation

## 2016-05-19 DIAGNOSIS — R079 Chest pain, unspecified: Secondary | ICD-10-CM | POA: Diagnosis present

## 2016-05-19 LAB — URINE MICROSCOPIC-ADD ON

## 2016-05-19 LAB — CBC
HCT: 45 % (ref 36.0–46.0)
Hemoglobin: 15.1 g/dL — ABNORMAL HIGH (ref 12.0–15.0)
MCH: 29.9 pg (ref 26.0–34.0)
MCHC: 33.6 g/dL (ref 30.0–36.0)
MCV: 89.1 fL (ref 78.0–100.0)
PLATELETS: 360 10*3/uL (ref 150–400)
RBC: 5.05 MIL/uL (ref 3.87–5.11)
RDW: 12.9 % (ref 11.5–15.5)
WBC: 9.8 10*3/uL (ref 4.0–10.5)

## 2016-05-19 LAB — COMPREHENSIVE METABOLIC PANEL
ALT: 23 U/L (ref 14–54)
AST: 32 U/L (ref 15–41)
Albumin: 4.5 g/dL (ref 3.5–5.0)
Alkaline Phosphatase: 49 U/L (ref 38–126)
Anion gap: 9 (ref 5–15)
BUN: 7 mg/dL (ref 6–20)
CALCIUM: 9.6 mg/dL (ref 8.9–10.3)
CHLORIDE: 102 mmol/L (ref 101–111)
CO2: 27 mmol/L (ref 22–32)
CREATININE: 0.64 mg/dL (ref 0.44–1.00)
Glucose, Bld: 103 mg/dL — ABNORMAL HIGH (ref 65–99)
Potassium: 4 mmol/L (ref 3.5–5.1)
Sodium: 138 mmol/L (ref 135–145)
TOTAL PROTEIN: 8 g/dL (ref 6.5–8.1)
Total Bilirubin: 1.4 mg/dL — ABNORMAL HIGH (ref 0.3–1.2)

## 2016-05-19 LAB — URINALYSIS, ROUTINE W REFLEX MICROSCOPIC
BILIRUBIN URINE: NEGATIVE
Glucose, UA: NEGATIVE mg/dL
Hgb urine dipstick: NEGATIVE
KETONES UR: NEGATIVE mg/dL
NITRITE: NEGATIVE
PROTEIN: NEGATIVE mg/dL
SPECIFIC GRAVITY, URINE: 1.007 (ref 1.005–1.030)
pH: 7.5 (ref 5.0–8.0)

## 2016-05-19 LAB — D-DIMER, QUANTITATIVE (NOT AT ARMC): D DIMER QUANT: 0.33 ug{FEU}/mL (ref 0.00–0.50)

## 2016-05-19 LAB — I-STAT TROPONIN, ED: Troponin i, poc: 0 ng/mL (ref 0.00–0.08)

## 2016-05-19 LAB — LIPASE, BLOOD: LIPASE: 36 U/L (ref 11–51)

## 2016-05-19 LAB — HCG, QUANTITATIVE, PREGNANCY

## 2016-05-19 MED ORDER — SODIUM CHLORIDE 0.9 % IV BOLUS (SEPSIS)
1000.0000 mL | Freq: Once | INTRAVENOUS | Status: AC
  Administered 2016-05-19: 1000 mL via INTRAVENOUS

## 2016-05-19 MED ORDER — DIPHENHYDRAMINE HCL 50 MG/ML IJ SOLN
25.0000 mg | Freq: Once | INTRAMUSCULAR | Status: AC
  Administered 2016-05-19: 25 mg via INTRAVENOUS
  Filled 2016-05-19: qty 1

## 2016-05-19 MED ORDER — METOCLOPRAMIDE HCL 5 MG/ML IJ SOLN
10.0000 mg | Freq: Once | INTRAMUSCULAR | Status: AC
  Administered 2016-05-19: 10 mg via INTRAVENOUS
  Filled 2016-05-19: qty 2

## 2016-05-19 NOTE — ED Triage Notes (Signed)
Per EMS, pt from home, c/o headache and chest wall pain x1 week. Pt seen at [facility] yesterday, given Nexium. Patient also reports SOB. 100% on RA with EMS.

## 2016-05-19 NOTE — ED Notes (Signed)
Patient d/c'd self care.  F/U reviewed.  Patient verbalized understanding. 

## 2016-05-19 NOTE — Discharge Instructions (Signed)
Please read and follow all provided instructions.  Your diagnoses today include:  1. Chest wall pain   2. Nonintractable headache, unspecified chronicity pattern, unspecified headache type   3. Upper respiratory tract infection, unspecified type    Tests performed today include: An EKG of your heart A chest x-ray Cardiac enzymes - a blood test for heart muscle damage Blood counts and electrolytes Vital signs. See below for your results today.   Medications prescribed:   Take any prescribed medications only as directed.  Follow-up instructions: Please follow-up with your primary care provider as soon as you can for further evaluation of your symptoms.   Return instructions:  SEEK IMMEDIATE MEDICAL ATTENTION IF: You have severe chest pain, especially if the pain is crushing or pressure-like and spreads to the arms, back, neck, or jaw, or if you have sweating, nausea (feeling sick to your stomach), or shortness of breath. THIS IS AN EMERGENCY. Don't wait to see if the pain will go away. Get medical help at once. Call 911 or 0 (operator). DO NOT drive yourself to the hospital.  Your chest pain gets worse and does not go away with rest.  You have an attack of chest pain lasting longer than usual, despite rest and treatment with the medications your caregiver has prescribed.  You wake from sleep with chest pain or shortness of breath. You feel dizzy or faint. You have chest pain not typical of your usual pain for which you originally saw your caregiver.  You have any other emergent concerns regarding your health.  Additional Information: Chest pain comes from many different causes. Your caregiver has diagnosed you as having chest pain that is not specific for one problem, but does not require admission.  You are at low risk for an acute heart condition or other serious illness.   Your vital signs today were: BP 144/82 (BP Location: Left Arm)    Pulse 82    Temp 97.6 F (36.4 C) (Oral)     Resp 13    Ht 5\' 3"  (1.6 m)    Wt 78.9 kg    SpO2 95%    BMI 30.82 kg/m  If your blood pressure (BP) was elevated above 135/85 this visit, please have this repeated by your doctor within one month. --------------

## 2016-05-19 NOTE — ED Provider Notes (Signed)
[facility] DEPT Provider Note   CSN: [id number] Arrival date & time: 05/19/16  1512  By signing my name below, I, Arianna Nassar, attest that this documentation has been prepared under the direction and in the presence of Shary Decamp, PA-C.  Electronically Signed: Julien Nordmann, ED Scribe. 05/19/16. 3:54 PM.    History   Chief Complaint Chief Complaint  Patient presents with  . Chest Pain  . Headache    The history is provided by the patient. No language interpreter was used.   HPI Comments: Christina Fox is a 35 y.o. female who has a PMhx of headache and abdominal pain presents to the Emergency Department by EMS complaining of constant, gradual worsening, chest pain x 1 week. Associated intermittent cough, upper abdominal pain, and diarrhea (x3) today. Pt says she has been feeling sick x 2 weeks and has been on Cefdenir since 11/16 for URI symptoms. She was seen at [facility] yesterday for her symptoms and was diagnosed with chest wall inflammation. Pt was prescribed naproxen without any relief. She has also been taking 800 mg Ibuprofen BID to further alleviate her pain without any modification. She further notes having an associated sudden onset, frontal headache while she laying down this morning. She notes her forehead began to feel "tingly and numb" and just "didn't feel right". Pt was going to get some water and when she stood up she began to feel very dizzy after standing up. She is currently on an estrogen birth control. Denies fever, nausea, vomiting, visual changes, numbness/tingling of extremities, family hx of MI, hx of HTN, hx of HLD, recent prolonged travel, recent surgeries in past 6 weeks, hx of DVT or PE.  Past Medical History:  Diagnosis Date  . Abdominal pain    "right lower quadrant pain and right lower back pain  . Anxiety   . Deviated septum 09/2011  . GERD (gastroesophageal reflux disease)   . Headache(784.0)    sinus  . History of echocardiogram    Echo 11/17:  EF 55-60, no RWMA, normal diastolic function  . Hypertension    no meds  . Kidney stones    no current problem  . Migraine   . Nasal turbinate hypertrophy 09/2011   bilat.  . Pelvic inflammatory disease     Patient Active Problem List   Diagnosis Date Noted  . Gallstone 01/07/2016  . History of colonic polyps 11/27/2015  . Right lower quadrant abdominal pain 11/27/2015  . HTN (hypertension), benign 11/24/2015  . Abdominal pain 11/16/2015  . Headache 10/13/2015  . 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 Anxiety disorder 11/03/2014    Past Surgical History:  Procedure Laterality Date  . CHOLECYSTECTOMY N/A 01/15/2016   Procedure: LAPAROSCOPIC CHOLECYSTECTOMY WITH INTRAOPERATIVE CHOLANGIOGRAM;  Surgeon: Christene Lye, MD;  Location: [facility] ORS;  Service: General;  Laterality: N/A;  . COLONOSCOPY  2007   [facility]: pedunculated 65mm polyp in distal sigmoid, sessile 32mm polyp at splenic flexure, larger polyp tubulovillous adenoma and smaller polyp tubular adenoma   . COLONOSCOPY  09-09-15  . COLONOSCOPY WITH PROPOFOL N/A 03/01/2016   Procedure: COLONOSCOPY WITH PROPOFOL;  Surgeon: Garlan Fair, MD;  Location: [facility];  Service: Endoscopy;  Laterality: N/A;  . NASAL SEPTOPLASTY W/ TURBINOPLASTY  10/04/2011   Procedure: NASAL SEPTOPLASTY WITH TURBINATE REDUCTION;  Surgeon: Ascencion Dike, MD;  Location: [facility];  Service: ENT;  Laterality: Bilateral;  . [place] EXTRACTION  2009    OB History    Gravida Para  Term Preterm AB Living   4 4 4     4    SAB TAB Ectopic Multiple Live Births                  Obstetric Comments   1st Menstrual Cycle:  10  1st Pregnancy:  16        Home Medications    Prior to Admission medications   Medication Sig Start Date End Date Taking? Authorizing Provider  clonazePAM (KLONOPIN) 0.5 MG tablet Take 1 tablet (0.5 mg total) by mouth daily. Patient taking differently: Take 0.25 mg by mouth 2 (two) times daily.  10/20/15   Robyn Haber, MD  esomeprazole (NEXIUM) 40 MG capsule Take 1 capsule (40 mg total) by mouth 2 (two) times daily before a meal. 04/29/16   Liliane Shi, PA-C  fluticasone (FLONASE) 50 MCG/ACT nasal spray Place 2 sprays into both nostrils daily. Patient taking differently: Place 2 sprays into both nostrils daily as needed for allergies.  12/22/15   Maren Reamer, MD  ibuprofen (ADVIL,MOTRIN) 800 MG tablet Take 1 tablet (800 mg total) by mouth 3 (three) times daily. 03/05/16   Keitha Butte, CNM  levonorgestrel-ethinyl estradiol (AUBRA) 0.1-20 MG-MCG tablet Take 1 tablet by mouth daily. Reported on 12/04/2015    Historical Provider, MD  montelukast (SINGULAIR) 10 MG tablet TAKE 1 TABLET BY MOUTH DAILY Patient taking differently: TAKE 10mg  TABLET BY MOUTH DAILY 01/18/16   Maren Reamer, MD  potassium chloride SA (K-DUR,KLOR-CON) 20 MEQ tablet Take 20 mEq by mouth 2 (two) times a week.    Historical Provider, MD  Prenat w/o A-FeCbn-DSS-FA-DHA (CITRANATAL HARMONY) 29-1-265 MG CAPS Take 1 capsule by mouth daily before breakfast. Patient taking differently: Take 1 capsule by mouth 3 (three) times a week.  03/29/16   Shelly Bombard, MD    Family History Family History  Problem Relation Age of Onset  . Kidney cancer Mother   . Hypertension Mother   . Migraines Mother   . Multiple sclerosis Mother   . Fibromyalgia Mother   . Liver disease Mother     NASH   . Hypertension Father   . Liver disease Father     liver transplant; had fatty liver disease  . Spina bifida Brother   . Migraines Maternal Grandmother   . Hypertension Other   . Colon cancer Paternal Grandfather     Social History Social History  Substance Use Topics  . Smoking status: Former Smoker    Packs/day: 0.25    Years: 10.00    Quit date: 08/23/2008  . Smokeless tobacco: Never Used  . Alcohol use No     Allergies   Clarithromycin; Bentyl [dicyclomine hcl]; Haldol [haloperidol]; Meclizine; Morphine and related; Toradol  [ketorolac tromethamine]; Macrobid [nitrofurantoin monohyd macro]; and Sulfa antibiotics   Review of Systems Review of Systems  A complete 10 system review of systems was obtained and all systems are negative except as noted in the HPI and PMH.    Physical Exam Updated Vital Signs BP 144/82 (BP Location: Left Arm)   Pulse 82   Temp 97.6 F (36.4 C) (Oral)   Resp 13   Ht 5\' 3"  (1.6 m)   Wt 174 lb (78.9 kg)   SpO2 95%   BMI 30.82 kg/m   Physical Exam  Constitutional: She is oriented to person, place, and time. Vital signs are normal. She appears well-developed and well-nourished.  HENT:  Head: Normocephalic and atraumatic.  Right Ear:  Hearing and external ear normal.  Left Ear: Hearing and external ear normal.  Mouth/Throat: Oropharynx is clear and moist.  Eyes: Conjunctivae and EOM are normal. Pupils are equal, round, and reactive to light.  Neck: Normal range of motion. Neck supple.  Cardiovascular: Normal rate, regular rhythm, normal heart sounds and intact distal pulses.   Pulmonary/Chest: Effort normal and breath sounds normal.  Chest wall tenderness on left anterior chest wall, no visible or palpable deformities  Abdominal: Soft. She exhibits no distension. There is tenderness (mild epigastric).  Musculoskeletal: Normal range of motion.  Neurological: She is alert and oriented to person, place, and time. She has normal strength. She displays normal reflexes. No cranial nerve deficit or sensory deficit. She exhibits normal muscle tone. Coordination normal.  Cranial Nerves:  II: Pupils equal, round, reactive to light III,IV, VI: ptosis not present, extra-ocular motions intact bilaterally  V,VII: smile symmetric, facial light touch sensation equal VIII: hearing grossly normal bilaterally  IX,X: midline uvula rise  XI: bilateral shoulder shrug equal and strong XII: midline tongue extension Finger to Nose exam unremarkable.   Skin: Skin is warm and dry.  Psychiatric: She  has a normal mood and affect. Her speech is normal and behavior is normal. Thought content normal.  Nursing note and vitals reviewed.  ED Treatments / Results  DIAGNOSTIC STUDIES: Oxygen Saturation is 95% on RA, adquate by my interpretation.  COORDINATION OF CARE:  3:50 PM Discussed treatment plan with pt at bedside and pt agreed to plan.  Labs (all labs ordered are listed, but only abnormal results are displayed) Labs Reviewed  CBC - Abnormal; Notable for the following:       Result Value   Hemoglobin 15.1 (*)    All other components within normal limits  COMPREHENSIVE METABOLIC PANEL - Abnormal; Notable for the following:    Glucose, Bld 103 (*)    Total Bilirubin 1.4 (*)    All other components within normal limits  URINALYSIS, ROUTINE W REFLEX MICROSCOPIC (NOT AT [facility]) - Abnormal; Notable for the following:    Leukocytes, UA SMALL (*)    All other components within normal limits  URINE MICROSCOPIC-ADD ON - Abnormal; Notable for the following:    Squamous Epithelial / LPF 0-5 (*)    Bacteria, UA FEW (*)    All other components within normal limits  LIPASE, BLOOD  HCG, QUANTITATIVE, PREGNANCY  D-DIMER, QUANTITATIVE (NOT AT [facility])  Randolm Idol, ED   EKG  EKG Interpretation None      Radiology Dg Chest 2 View  Result Date: 05/19/2016 CLINICAL DATA:  35 year old presenting with 1 week history of left upper chest tightness and cough. Former smoker. Current history of hypertension. EXAM: CHEST  2 VIEW COMPARISON:  04/29/2016 and earlier. FINDINGS: Cardiomediastinal silhouette unremarkable, unchanged. Lungs clear. Bronchovascular markings normal. Pulmonary vascularity normal. No visible pleural effusions. No pneumothorax. Visualized bony thorax intact. IMPRESSION: No acute cardiopulmonary disease.  Stable examination. Electronically Signed   By: Evangeline Dakin M.D.   On: 05/19/2016 16:27    Procedures Procedures (including critical care time)  Medications Ordered in  ED Medications - No data to display   Initial Impression / Assessment and Plan / ED Course  I have reviewed the triage vital signs and the nursing notes.  Pertinent labs & imaging results that were available during my care of the patient were reviewed by me and considered in my medical decision making (see chart for details).  Clinical Course    Final Clinical  Impressions(s) / ED Diagnoses   I have reviewed and evaluated the relevant laboratory values I have reviewed and evaluated the relevant imaging studies.  I have interpreted the relevant EKG. I have reviewed the relevant previous healthcare records. I have reviewed EMS Documentation. I obtained HPI from historian.  ED Course:  Assessment: Pt is a 35yF presents with CP x 1 week as well as gradual onset headache this AM. Risk Factors for ACS: None. Seen in past for CP with unremarkable work up. Seen by PCP as well. Chest pain is not likely of cardiac or pulmonary etiology d/t presentation, perc negative, VSS, no tracheal deviation, no JVD or new murmur, RRR, breath sounds equal bilaterally, EKG without acute abnormalities, negative troponin, and negative CXR. D Dimer negative. Heart Score 0. Patient is without high-risk features of headache including: Sudden onset/thunderclap HA, Altered mental status, Accompanying seizure, Headache with exertion, Age > 50, History of immunocompromise, Neck or shoulder pain, Fever, Use of anticoagulation, Family history of spontaneous SAH, Concomitant drug use, Toxic exposure. Patient has a normal complete neurological exam, normal vital signs, normal level of consciousness, no signs of meningismus, is well-appearing/non-toxic appearing, no signs of trauma. No papilledema, no pain over the temporal arteries. Imaging with CT/MRI not indicated given history and physical exam findings. No dangerous or life-threatening conditions suspected or identified by history, physical exam, and by work-up. No indications for  hospitalization identified.  Headache improved with migraine cocktail. Pt has been advised start a NSAIDs and return to the ED is CP becomes exertional, associated with diaphoresis or nausea, radiates to left jaw/arm, worsens or becomes concerning in any way. No acute pathology identified on today's visit. Likely URI related vs costochondritis. Pt appears reliable for follow up and is agreeable to discharge. Patient is in no acute distress. Vital Signs are stable. Patient is able to ambulate. Patient able to tolerate PO.   Disposition/Plan:  DC Home Additional Verbal discharge instructions given and discussed with patient.  Pt Instructed to f/u with PCP in the next week for evaluation and treatment of symptoms. Return precautions given Pt acknowledges and agrees with plan  Supervising Physician Charlesetta Shanks, MD   Final diagnoses:  Chest wall pain  Nonintractable headache, unspecified chronicity pattern, unspecified headache type  Upper respiratory tract infection, unspecified type   I personally performed the services described in this documentation, which was scribed in my presence. The recorded information has been reviewed and is accurate.   New Prescriptions New Prescriptions   No medications on file     Shary Decamp, PA-C 05/19/16 1643    Charlesetta Shanks, MD 05/19/16 2325

## 2016-05-22 ENCOUNTER — Encounter (HOSPITAL_COMMUNITY): Payer: Self-pay

## 2016-05-22 ENCOUNTER — Emergency Department (HOSPITAL_COMMUNITY): Payer: Medicaid Other

## 2016-05-22 ENCOUNTER — Emergency Department (HOSPITAL_COMMUNITY)
Admission: EM | Admit: 2016-05-22 | Discharge: 2016-05-22 | Disposition: A | Discharge status: Home / Self Care | Payer: Medicaid Other | Attending: Emergency Medicine | Admitting: Emergency Medicine

## 2016-05-22 ENCOUNTER — Other Ambulatory Visit: Payer: Self-pay

## 2016-05-22 DIAGNOSIS — Z87891 Personal history of nicotine dependence: Secondary | ICD-10-CM | POA: Diagnosis not present

## 2016-05-22 DIAGNOSIS — R0789 Other chest pain: Secondary | ICD-10-CM | POA: Diagnosis present

## 2016-05-22 DIAGNOSIS — J40 Bronchitis, not specified as acute or chronic: Secondary | ICD-10-CM | POA: Diagnosis not present

## 2016-05-22 DIAGNOSIS — I10 Essential (primary) hypertension: Secondary | ICD-10-CM | POA: Insufficient documentation

## 2016-05-22 DIAGNOSIS — Z79899 Other long term (current) drug therapy: Secondary | ICD-10-CM | POA: Diagnosis not present

## 2016-05-22 LAB — CBC
HCT: 41.8 % (ref 36.0–46.0)
Hemoglobin: 14.3 g/dL (ref 12.0–15.0)
MCH: 30.1 pg (ref 26.0–34.0)
MCHC: 34.2 g/dL (ref 30.0–36.0)
MCV: 88 fL (ref 78.0–100.0)
Platelets: 298 K/uL (ref 150–400)
RBC: 4.75 MIL/uL (ref 3.87–5.11)
RDW: 12.5 % (ref 11.5–15.5)
WBC: 9.7 K/uL (ref 4.0–10.5)

## 2016-05-22 LAB — BASIC METABOLIC PANEL WITH GFR
Anion gap: 10 (ref 5–15)
BUN: 6 mg/dL (ref 6–20)
CO2: 24 mmol/L (ref 22–32)
Calcium: 9.7 mg/dL (ref 8.9–10.3)
Chloride: 104 mmol/L (ref 101–111)
Creatinine, Ser: 0.77 mg/dL (ref 0.44–1.00)
GFR calc Af Amer: >60 mL/min
GFR calc non Af Amer: >60 mL/min
Glucose, Bld: 100 mg/dL — ABNORMAL HIGH (ref 65–99)
Potassium: 3.5 mmol/L (ref 3.5–5.1)
Sodium: 138 mmol/L (ref 135–145)

## 2016-05-22 LAB — I-STAT TROPONIN, ED: Troponin i, poc: 0 ng/mL (ref 0.00–0.08)

## 2016-05-22 MED ORDER — ALBUTEROL SULFATE HFA 108 (90 BASE) MCG/ACT IN AERS: 2.0000 | INHALATION_SPRAY | RESPIRATORY_TRACT | 3 refills | Status: DC | PRN

## 2016-05-22 MED ORDER — PREDNISONE 20 MG PO TABS: 40.0000 mg | ORAL_TABLET | Freq: Every day | ORAL | 0 refills | Status: DC

## 2016-05-22 MED ORDER — GUAIFENESIN 100 MG/5ML PO LIQD: 100.0000 mg | ORAL | 0 refills | Status: DC | PRN

## 2016-05-22 NOTE — ED Triage Notes (Signed)
Patient complains of intermittent left anterior chest pain with radiation to left arm. States that she has been sick the past week and finished antibiotics for sinus infection today. Complains of vague headache and ongoing cough. NAD

## 2016-05-22 NOTE — ED Provider Notes (Signed)
[facility] DEPT Provider Note   CSN: [id number] Arrival date & time: 05/22/16  [id number]     History   Chief Complaint Chief Complaint  Patient presents with  . Chest Pain    HPI Christina Fox is a 35 y.o. female.  HPI  The patient is a 35 year old female, she has a frequent ED utilizer, she presents today with recurrent chest pain which has been constant for the last week, she reports that she has had an upper respiratory infection going on for a month and a half with coughing, runny nose, sore throat, nasal congestion and has been on multiple antibiotics including amoxicillin and has just finished Ceftin air after 10 days. She reports that she is still not any better and is complaining of left upper chest wall pain. This was her reevaluated several days ago when she had a normal workup and was found to have likely costochondritis. She reports that this pain is continual, she also has pain in her left shoulder and in her left hand, those pains are worse when she moves her arm or her chest. She states she is 10 coughing but denies fevers, denies swelling of the legs, denies abdominal pain nausea or vomiting.  Past Medical History:  Diagnosis Date  . Abdominal pain    "right lower quadrant pain and right lower back pain  . Anxiety   . Deviated septum 09/2011  . GERD (gastroesophageal reflux disease)   . Headache(784.0)    sinus  . History of echocardiogram    Echo 11/17: EF 55-60, no RWMA, normal diastolic function  . Hypertension    no meds  . Kidney stones    no current problem  . Migraine   . Nasal turbinate hypertrophy 09/2011   bilat.  . Pelvic inflammatory disease     Patient Active Problem List   Diagnosis Date Noted  . Gallstone 01/07/2016  . History of colonic polyps 11/27/2015  . Right lower quadrant abdominal pain 11/27/2015  . HTN (hypertension), benign 11/24/2015  . Abdominal pain 11/16/2015  . Headache 10/13/2015  . 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 Anxiety  disorder 11/03/2014    Past Surgical History:  Procedure Laterality Date  . CHOLECYSTECTOMY N/A 01/15/2016   Procedure: LAPAROSCOPIC CHOLECYSTECTOMY WITH INTRAOPERATIVE CHOLANGIOGRAM;  Surgeon: Christene Lye, MD;  Location: [facility] ORS;  Service: General;  Laterality: N/A;  . COLONOSCOPY  2007   [facility]: pedunculated 65mm polyp in distal sigmoid, sessile 29mm polyp at splenic flexure, larger polyp tubulovillous adenoma and smaller polyp tubular adenoma   . COLONOSCOPY  09-09-15  . COLONOSCOPY WITH PROPOFOL N/A 03/01/2016   Procedure: COLONOSCOPY WITH PROPOFOL;  Surgeon: Garlan Fair, MD;  Location: [facility];  Service: Endoscopy;  Laterality: N/A;  . NASAL SEPTOPLASTY W/ TURBINOPLASTY  10/04/2011   Procedure: NASAL SEPTOPLASTY WITH TURBINATE REDUCTION;  Surgeon: Ascencion Dike, MD;  Location: [place];  Service: ENT;  Laterality: Bilateral;  . WISDOM TOOTH EXTRACTION  2009    OB History    Gravida Para Term Preterm AB Living   4 4 4     4    SAB TAB Ectopic Multiple Live Births                  Obstetric Comments   1st Menstrual Cycle:  10  1st Pregnancy:  16        Home Medications    Prior to Admission medications   Medication Sig Start Date End Date Taking? Authorizing Provider  albuterol (PROVENTIL HFA;VENTOLIN HFA) 108 (90 Base) MCG/ACT inhaler Inhale 2 puffs into the lungs every 4 (four) hours as needed for wheezing or shortness of breath. 05/22/16   Noemi Chapel, MD  clonazePAM (KLONOPIN) 0.5 MG tablet Take 1 tablet (0.5 mg total) by mouth daily. Patient taking differently: Take 0.25 mg by mouth 2 (two) times daily.  10/20/15   Robyn Haber, MD  esomeprazole (NEXIUM) 40 MG capsule Take 1 capsule (40 mg total) by mouth 2 (two) times daily before a meal. 04/29/16   Liliane Shi, PA-C  fluticasone (FLONASE) 50 MCG/ACT nasal spray Place 2 sprays into both nostrils daily. Patient taking differently: Place 2 sprays into both nostrils daily as needed for  allergies.  12/22/15   Maren Reamer, MD  guaiFENesin (ROBITUSSIN) 100 MG/5ML liquid Take 5-10 mLs (100-200 mg total) by mouth every 4 (four) hours as needed for cough. 05/22/16   Noemi Chapel, MD  ibuprofen (ADVIL,MOTRIN) 800 MG tablet Take 1 tablet (800 mg total) by mouth 3 (three) times daily. Patient taking differently: Take 800 mg by mouth 3 (three) times daily as needed for mild pain or moderate pain.  03/05/16   Keitha Butte, CNM  levonorgestrel-ethinyl estradiol (AUBRA) 0.1-20 MG-MCG tablet Take 1 tablet by mouth daily. Reported on 12/04/2015    Historical Provider, MD  montelukast (SINGULAIR) 10 MG tablet TAKE 1 TABLET BY MOUTH DAILY Patient taking differently: TAKE 10mg  TABLET BY MOUTH DAILY 01/18/16   Maren Reamer, MD  potassium chloride SA (K-DUR,KLOR-CON) 20 MEQ tablet Take 20 mEq by mouth 2 (two) times a week.    Historical Provider, MD  predniSONE (DELTASONE) 20 MG tablet Take 2 tablets (40 mg total) by mouth daily. 05/22/16   Noemi Chapel, MD  Prenat w/o A-FeCbn-DSS-FA-DHA (CITRANATAL HARMONY) 29-1-265 MG CAPS Take 1 capsule by mouth daily before breakfast. Patient taking differently: Take 1 capsule by mouth 3 (three) times a week.  03/29/16   Shelly Bombard, MD    Family History Family History  Problem Relation Age of Onset  . Kidney cancer Mother   . Hypertension Mother   . Migraines Mother   . Multiple sclerosis Mother   . Fibromyalgia Mother   . Liver disease Mother     NASH   . Hypertension Father   . Liver disease Father     liver transplant; had fatty liver disease  . Spina bifida Brother   . Migraines Maternal Grandmother   . Hypertension Other   . Colon cancer Paternal Grandfather     Social History Social History  Substance Use Topics  . Smoking status: Former Smoker    Packs/day: 0.25    Years: 10.00    Quit date: 08/23/2008  . Smokeless tobacco: Never Used  . Alcohol use No     Allergies   Clarithromycin; Bentyl [dicyclomine hcl]; Haldol  [haloperidol]; Meclizine; Morphine and related; Toradol [ketorolac tromethamine]; Macrobid [nitrofurantoin monohyd macro]; and Sulfa antibiotics   Review of Systems Review of Systems  All other systems reviewed and are negative.    Physical Exam Updated Vital Signs BP (!) 147/108 (BP Location: Left Arm)   Pulse 95   Temp 98.3 F (36.8 C) (Oral)   Resp 18   Wt 175 lb (79.4 kg)   SpO2 99%   BMI 31.00 kg/m   Physical Exam  Constitutional: She appears well-developed and well-nourished. No distress.  HENT:  Head: Normocephalic and atraumatic.  Mouth/Throat: Oropharynx is clear and moist. No oropharyngeal exudate.  Eyes: Conjunctivae and EOM are normal. Pupils are equal, round, and reactive to light. Right eye exhibits no discharge. Left eye exhibits no discharge. No scleral icterus.  Neck: Normal range of motion. Neck supple. No JVD present. No thyromegaly present.  Cardiovascular: Normal rate, regular rhythm, normal heart sounds and intact distal pulses.  Exam reveals no gallop and no friction rub.   No murmur heard. Pulmonary/Chest: Effort normal and breath sounds normal. No respiratory distress. She has no wheezes. She has no rales. She exhibits tenderness ( Reproducible tenderness over the left chest wall superiorly, no signs of bruising or rash).  Abdominal: Soft. Bowel sounds are normal. She exhibits no distension and no mass. There is no tenderness.  Musculoskeletal: Normal range of motion. She exhibits no edema or tenderness.  Lymphadenopathy:    She has no cervical adenopathy.  Neurological: She is alert. Coordination normal.  Skin: Skin is warm and dry. No rash noted. No erythema.  Psychiatric: She has a normal mood and affect. Her behavior is normal.  Nursing note and vitals reviewed.    ED Treatments / Results  Labs (all labs ordered are listed, but only abnormal results are displayed) Labs Reviewed  BASIC METABOLIC PANEL - Abnormal; Notable for the following:        Result Value   Glucose, Bld 100 (*)    All other components within normal limits  CBC  I-STAT TROPOININ, ED    EKG  EKG Interpretation  Date/Time:  Sunday May 22 2016 19:02:58 EST Ventricular Rate:  94 PR Interval:  130 QRS Duration: 80 QT Interval:  338 QTC Calculation: 422 R Axis:   75 Text Interpretation:  Normal sinus rhythm ST & T wave abnormality, consider inferior ischemia Abnormal ECG since last tracing no significant change Confirmed by Sabra Heck  MD, Lindsey Hommel ([id number]) on 05/22/2016 9:10:20 PM       Radiology Dg Chest 2 View  Result Date: 05/22/2016 CLINICAL DATA:  Chest pain and shortness of breath beginning Thursday. Worsening today. Left arm pain. EXAM: CHEST  2 VIEW COMPARISON:  05/19/2016 FINDINGS: The heart size and mediastinal contours are within normal limits. Both lungs are clear. Minimal thoracic spondylosis. IMPRESSION: No active cardiopulmonary disease. Electronically Signed   By: Van Clines M.D.   On: 05/22/2016 20:36    Procedures Procedures (including critical care time)  Medications Ordered in ED Medications - No data to display   Initial Impression / Assessment and Plan / ED Course  I have reviewed the triage vital signs and the nursing notes.  Pertinent labs & imaging results that were available during my care of the patient were reviewed by me and considered in my medical decision making (see chart for details).  Clinical Course     Once again the patient has been evaluated for similar symptoms with her ongoing upper respiratory illness get at the time that I talked where she has absolutely no respiratory distress and never coughs. Prior to my exam the patient entered he had a chest x-ray and laboratory work ordered, not surprisingly all of it was pristine normal. The patient can be discharged home to follow-up in the outpatient setting  Final Clinical Impressions(s) / ED Diagnoses   Final diagnoses:  Chest wall pain  Bronchitis      New Prescriptions New Prescriptions   ALBUTEROL (PROVENTIL HFA;VENTOLIN HFA) 108 (90 BASE) MCG/ACT INHALER    Inhale 2 puffs into the lungs every 4 (four) hours as needed for wheezing or shortness of breath.  GUAIFENESIN (ROBITUSSIN) 100 MG/5ML LIQUID    Take 5-10 mLs (100-200 mg total) by mouth every 4 (four) hours as needed for cough.   PREDNISONE (DELTASONE) 20 MG TABLET    Take 2 tablets (40 mg total) by mouth daily.     Noemi Chapel, MD 05/22/16 [id number]

## 2016-05-22 NOTE — ED Notes (Signed)
Pt denies concerns with dc 

## 2016-05-22 NOTE — Discharge Instructions (Signed)
Please drink plenty of fluids Continue to take Tessalon for cough Prednisone daily for 5 days Albuterol every 4 hours as needed

## 2016-05-30 ENCOUNTER — Ambulatory Visit (INDEPENDENT_AMBULATORY_CARE_PROVIDER_SITE_OTHER): Payer: Medicaid Other | Admitting: Otolaryngology

## 2016-05-30 ENCOUNTER — Encounter (HOSPITAL_COMMUNITY): Payer: Self-pay | Admitting: Emergency Medicine

## 2016-05-30 ENCOUNTER — Emergency Department (HOSPITAL_COMMUNITY)
Admission: EM | Admit: 2016-05-30 | Discharge: 2016-05-30 | Disposition: A | Discharge status: Home / Self Care | Payer: Medicaid Other | Attending: Emergency Medicine | Admitting: Emergency Medicine

## 2016-05-30 ENCOUNTER — Emergency Department (HOSPITAL_COMMUNITY): Payer: Medicaid Other

## 2016-05-30 DIAGNOSIS — H6983 Other specified disorders of Eustachian tube, bilateral: Secondary | ICD-10-CM | POA: Diagnosis not present

## 2016-05-30 DIAGNOSIS — Z79899 Other long term (current) drug therapy: Secondary | ICD-10-CM | POA: Insufficient documentation

## 2016-05-30 DIAGNOSIS — J310 Chronic rhinitis: Secondary | ICD-10-CM

## 2016-05-30 DIAGNOSIS — I10 Essential (primary) hypertension: Secondary | ICD-10-CM | POA: Insufficient documentation

## 2016-05-30 DIAGNOSIS — R079 Chest pain, unspecified: Secondary | ICD-10-CM | POA: Diagnosis present

## 2016-05-30 DIAGNOSIS — Z87891 Personal history of nicotine dependence: Secondary | ICD-10-CM | POA: Insufficient documentation

## 2016-05-30 LAB — RAPID URINE DRUG SCREEN, HOSP PERFORMED
AMPHETAMINES: NOT DETECTED
BENZODIAZEPINES: NOT DETECTED
Barbiturates: NOT DETECTED
COCAINE: NOT DETECTED
Opiates: NOT DETECTED
Tetrahydrocannabinol: NOT DETECTED

## 2016-05-30 LAB — I-STAT TROPONIN, ED: TROPONIN I, POC: 0 ng/mL (ref 0.00–0.08)

## 2016-05-30 LAB — CBC
HEMATOCRIT: 41.4 % (ref 36.0–46.0)
HEMOGLOBIN: 14.1 g/dL (ref 12.0–15.0)
MCH: 30.3 pg (ref 26.0–34.0)
MCHC: 34.1 g/dL (ref 30.0–36.0)
MCV: 88.8 fL (ref 78.0–100.0)
Platelets: 354 10*3/uL (ref 150–400)
RBC: 4.66 MIL/uL (ref 3.87–5.11)
RDW: 12.7 % (ref 11.5–15.5)
WBC: 7.4 10*3/uL (ref 4.0–10.5)

## 2016-05-30 LAB — BASIC METABOLIC PANEL
ANION GAP: 12 (ref 5–15)
BUN: 6 mg/dL (ref 6–20)
CO2: 25 mmol/L (ref 22–32)
Calcium: 9.4 mg/dL (ref 8.9–10.3)
Chloride: 102 mmol/L (ref 101–111)
Creatinine, Ser: 0.72 mg/dL (ref 0.44–1.00)
GLUCOSE: 106 mg/dL — AB (ref 65–99)
POTASSIUM: 2.8 mmol/L — AB (ref 3.5–5.1)
Sodium: 139 mmol/L (ref 135–145)

## 2016-05-30 MED ORDER — POTASSIUM CHLORIDE CRYS ER 20 MEQ PO TBCR
60.0000 meq | EXTENDED_RELEASE_TABLET | Freq: Once | ORAL | Status: AC
  Administered 2016-05-30: 60 meq via ORAL
  Filled 2016-05-30: qty 3

## 2016-05-30 NOTE — ED Triage Notes (Signed)
Pt had a sudden onset of chest pain when she sat down to dinner tonight.  Pt c/o tingling all over her body and a sore neck and upper back.

## 2016-05-30 NOTE — ED Notes (Signed)
EKG shown to Dr. Alvino Chapel

## 2016-06-01 ENCOUNTER — Other Ambulatory Visit (INDEPENDENT_AMBULATORY_CARE_PROVIDER_SITE_OTHER): Payer: Self-pay | Admitting: Otolaryngology

## 2016-06-01 DIAGNOSIS — J329 Chronic sinusitis, unspecified: Secondary | ICD-10-CM

## 2016-06-02 ENCOUNTER — Encounter (HOSPITAL_BASED_OUTPATIENT_CLINIC_OR_DEPARTMENT_OTHER): Payer: Self-pay

## 2016-06-02 ENCOUNTER — Emergency Department (HOSPITAL_BASED_OUTPATIENT_CLINIC_OR_DEPARTMENT_OTHER): Payer: Medicaid Other

## 2016-06-02 ENCOUNTER — Ambulatory Visit
Admission: RE | Admit: 2016-06-02 | Discharge: 2016-06-02 | Disposition: A | Discharge status: Home / Self Care | Payer: Medicaid Other | Source: Ambulatory Visit | Attending: Otolaryngology | Admitting: Otolaryngology

## 2016-06-02 ENCOUNTER — Emergency Department (HOSPITAL_BASED_OUTPATIENT_CLINIC_OR_DEPARTMENT_OTHER)
Admission: EM | Admit: 2016-06-02 | Discharge: 2016-06-02 | Disposition: A | Discharge status: Home / Self Care | Payer: Medicaid Other | Attending: Emergency Medicine | Admitting: Emergency Medicine

## 2016-06-02 DIAGNOSIS — Z87891 Personal history of nicotine dependence: Secondary | ICD-10-CM | POA: Insufficient documentation

## 2016-06-02 DIAGNOSIS — M79604 Pain in right leg: Secondary | ICD-10-CM | POA: Diagnosis not present

## 2016-06-02 DIAGNOSIS — I10 Essential (primary) hypertension: Secondary | ICD-10-CM | POA: Diagnosis not present

## 2016-06-02 DIAGNOSIS — J069 Acute upper respiratory infection, unspecified: Secondary | ICD-10-CM | POA: Diagnosis not present

## 2016-06-02 DIAGNOSIS — H6692 Otitis media, unspecified, left ear: Secondary | ICD-10-CM | POA: Insufficient documentation

## 2016-06-02 DIAGNOSIS — R079 Chest pain, unspecified: Secondary | ICD-10-CM | POA: Diagnosis present

## 2016-06-02 DIAGNOSIS — Z79899 Other long term (current) drug therapy: Secondary | ICD-10-CM | POA: Insufficient documentation

## 2016-06-02 DIAGNOSIS — J329 Chronic sinusitis, unspecified: Secondary | ICD-10-CM

## 2016-06-02 LAB — CBC WITH DIFFERENTIAL/PLATELET
BASOS ABS: 0 10*3/uL (ref 0.0–0.1)
Basophils Relative: 0 %
Eosinophils Absolute: 0 10*3/uL (ref 0.0–0.7)
Eosinophils Relative: 0 %
HEMATOCRIT: 38.8 % (ref 36.0–46.0)
HEMOGLOBIN: 13 g/dL (ref 12.0–15.0)
LYMPHS PCT: 6 %
Lymphs Abs: 0.6 10*3/uL — ABNORMAL LOW (ref 0.7–4.0)
MCH: 30.4 pg (ref 26.0–34.0)
MCHC: 33.5 g/dL (ref 30.0–36.0)
MCV: 90.7 fL (ref 78.0–100.0)
MONO ABS: 0.3 10*3/uL (ref 0.1–1.0)
Monocytes Relative: 3 %
NEUTROS ABS: 9.4 10*3/uL — AB (ref 1.7–7.7)
NEUTROS PCT: 91 %
Platelets: 288 10*3/uL (ref 150–400)
RBC: 4.28 MIL/uL (ref 3.87–5.11)
RDW: 12.9 % (ref 11.5–15.5)
WBC: 10.4 10*3/uL (ref 4.0–10.5)

## 2016-06-02 LAB — URINALYSIS, ROUTINE W REFLEX MICROSCOPIC
BILIRUBIN URINE: NEGATIVE
Glucose, UA: NEGATIVE mg/dL
Ketones, ur: NEGATIVE mg/dL
Nitrite: NEGATIVE
PH: 7.5 (ref 5.0–8.0)
Protein, ur: NEGATIVE mg/dL
SPECIFIC GRAVITY, URINE: 1.003 — AB (ref 1.005–1.030)

## 2016-06-02 LAB — COMPREHENSIVE METABOLIC PANEL
ALBUMIN: 4.3 g/dL (ref 3.5–5.0)
ALK PHOS: 39 U/L (ref 38–126)
ALT: 27 U/L (ref 14–54)
AST: 25 U/L (ref 15–41)
Anion gap: 8 (ref 5–15)
BILIRUBIN TOTAL: 0.7 mg/dL (ref 0.3–1.2)
BUN: 9 mg/dL (ref 6–20)
CALCIUM: 9.7 mg/dL (ref 8.9–10.3)
CO2: 25 mmol/L (ref 22–32)
CREATININE: 0.57 mg/dL (ref 0.44–1.00)
Chloride: 105 mmol/L (ref 101–111)
GFR calc Af Amer: >60 mL/min (ref >60)
GLUCOSE: 117 mg/dL — AB (ref 65–99)
POTASSIUM: 3.9 mmol/L (ref 3.5–5.1)
Sodium: 138 mmol/L (ref 135–145)
TOTAL PROTEIN: 7.5 g/dL (ref 6.5–8.1)

## 2016-06-02 LAB — URINALYSIS, MICROSCOPIC (REFLEX)

## 2016-06-02 LAB — PREGNANCY, URINE: Preg Test, Ur: NEGATIVE

## 2016-06-02 LAB — TROPONIN I

## 2016-06-02 LAB — D-DIMER, QUANTITATIVE: D-Dimer, Quant: <0.27 ug/mL-FEU (ref 0.00–0.50)

## 2016-06-02 MED ORDER — AMOXICILLIN-POT CLAVULANATE 875-125 MG PO TABS: 1.0000 | ORAL_TABLET | Freq: Two times a day (BID) | ORAL | 0 refills | Status: AC

## 2016-06-02 NOTE — ED Triage Notes (Addendum)
CP x 2 weeks-seen at [facility] ED 2 weeks ago-dx with bronchitis-seen at [facility] ED and PCP this week for c/o-c/o new onset on right calf pain x today-NAD-steady gait

## 2016-06-02 NOTE — ED Provider Notes (Signed)
[facility] Provider Note   CSN: [id number] Arrival date & time: 06/02/16  1532     History   Chief Complaint Chief Complaint  Patient presents with  . Chest Pain    HPI Christina Fox is a 35 y.o. female.  HPI Sick for a few weeks, sinus infection, ear infection, bronchitis. ENT had ordered CT of sinuses because of pain pressure in left ear and dizziness, however today felt worse, pain in right lower extremity in calf which began today, feels like in calf to foot.  Yesterday developed chest pain, back pain, rib pain, right lower ribs, neck.  Feels like a pressure. 7/10. Tried heating pad, resting. Feeling weak and tired. Shortness of breath for a few weeks, 2 weeks.  ENT put her on steroids on Monday, dose pack. Taking inhaler. Monday found to have low potassium, chest pressure.   CP on for a while however feels different now   A few years of OCP, stopped smoking 7 yrs ago, no fam hx of early heart disease, no hx of recent surgery (colon polyps sept, gallbladder July),  No recent immobilization.  Past Medical History:  Diagnosis Date  . Abdominal pain    "right lower quadrant pain and right lower back pain  . Anxiety   . Deviated septum 09/2011  . GERD (gastroesophageal reflux disease)   . Headache(784.0)    sinus  . History of echocardiogram    Echo 11/17: EF 55-60, no RWMA, normal diastolic function  . Hypertension    no meds  . Kidney stones    no current problem  . Migraine   . Nasal turbinate hypertrophy 09/2011   bilat.  . Pelvic inflammatory disease     Patient Active Problem List   Diagnosis Date Noted  . Gallstone 01/07/2016  . History of colonic polyps 11/27/2015  . Right lower quadrant abdominal pain 11/27/2015  . HTN (hypertension), benign 11/24/2015  . Abdominal pain 11/16/2015  . Headache 10/13/2015  . 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 Anxiety disorder 11/03/2014    Past Surgical History:  Procedure Laterality Date  . CHOLECYSTECTOMY  N/A 01/15/2016   Procedure: LAPAROSCOPIC CHOLECYSTECTOMY WITH INTRAOPERATIVE CHOLANGIOGRAM;  Surgeon: Christene Lye, MD;  Location: [facility];  Service: General;  Laterality: N/A;  . COLONOSCOPY  2007   [facility]: pedunculated 96mm polyp in distal sigmoid, sessile 32mm polyp at splenic flexure, larger polyp tubulovillous adenoma and smaller polyp tubular adenoma   . COLONOSCOPY  09-09-15  . COLONOSCOPY WITH PROPOFOL N/A 03/01/2016   Procedure: COLONOSCOPY WITH PROPOFOL;  Surgeon: Garlan Fair, MD;  Location: [facility];  Service: Endoscopy;  Laterality: N/A;  . NASAL SEPTOPLASTY W/ TURBINOPLASTY  10/04/2011   Procedure: NASAL SEPTOPLASTY WITH TURBINATE REDUCTION;  Surgeon: Ascencion Dike, MD;  Location: [facility];  Service: ENT;  Laterality: Bilateral;  . WISDOM TOOTH EXTRACTION  2009    OB History    Gravida Para Term Preterm AB Living   4 4 4     4    SAB TAB Ectopic Multiple Live Births                  Obstetric Comments   1st Menstrual Cycle:  10  1st Pregnancy:  16        Home Medications    Prior to Admission medications   Medication Sig Start Date End Date Taking? Authorizing Provider  albuterol (PROVENTIL HFA;VENTOLIN HFA) 108 (90 Base) MCG/ACT inhaler Inhale 2 puffs into the  lungs every 4 (four) hours as needed for wheezing or shortness of breath. 05/22/16   Noemi Chapel, MD  amoxicillin-clavulanate (AUGMENTIN) 875-125 MG tablet Take 1 tablet by mouth 2 (two) times daily. 06/02/16 06/09/16  Gareth Morgan, MD  clonazePAM (KLONOPIN) 0.5 MG tablet Take 1 tablet (0.5 mg total) by mouth daily. Patient taking differently: Take 0.25 mg by mouth 2 (two) times daily.  10/20/15   Robyn Haber, MD  esomeprazole (NEXIUM) 40 MG capsule Take 1 capsule (40 mg total) by mouth 2 (two) times daily before a meal. 04/29/16   Liliane Shi, PA-C  fluticasone (FLONASE) 50 MCG/ACT nasal spray Place 2 sprays into both nostrils daily. Patient taking differently: Place 2 sprays  into both nostrils daily as needed for allergies.  12/22/15   Maren Reamer, MD  guaiFENesin (ROBITUSSIN) 100 MG/5ML liquid Take 5-10 mLs (100-200 mg total) by mouth every 4 (four) hours as needed for cough. 05/22/16   Noemi Chapel, MD  ibuprofen (ADVIL,MOTRIN) 800 MG tablet Take 1 tablet (800 mg total) by mouth 3 (three) times daily. Patient taking differently: Take 800 mg by mouth 3 (three) times daily as needed for mild pain or moderate pain.  03/05/16   Keitha Butte, CNM  levonorgestrel-ethinyl estradiol (AUBRA) 0.1-20 MG-MCG tablet Take 1 tablet by mouth daily. Reported on 12/04/2015    Historical Provider, MD  loratadine (CLARITIN) 10 MG tablet Take 10 mg by mouth daily. 05/26/16 05/26/17  Historical Provider, MD  montelukast (SINGULAIR) 10 MG tablet TAKE 1 TABLET BY MOUTH DAILY Patient taking differently: TAKE 10mg  TABLET BY MOUTH DAILY 01/18/16   Maren Reamer, MD  potassium chloride SA (K-DUR,KLOR-CON) 20 MEQ tablet Take 20 mEq by mouth 2 (two) times a week.    Historical Provider, MD  predniSONE (DELTASONE) 20 MG tablet Take 2 tablets (40 mg total) by mouth daily. Patient not taking: Reported on 05/30/2016 05/22/16   Noemi Chapel, MD  predniSONE (STERAPRED UNI-PAK 21 TAB) 10 MG (21) TBPK tablet Take 10 mg by mouth daily.    Historical Provider, MD  Prenat w/o A-FeCbn-DSS-FA-DHA (CITRANATAL HARMONY) 29-1-265 MG CAPS Take 1 capsule by mouth daily before breakfast. Patient not taking: Reported on 05/30/2016 03/29/16   Shelly Bombard, MD  scopolamine (TRANSDERM-SCOP) 1 MG/3DAYS Place 1 patch onto the skin every 3 (three) days. 05/27/16   Historical Provider, MD    Family History Family History  Problem Relation Age of Onset  . Kidney cancer Mother   . Hypertension Mother   . Migraines Mother   . Multiple sclerosis Mother   . Fibromyalgia Mother   . Liver disease Mother     NASH   . Hypertension Father   . Liver disease Father     liver transplant; had fatty liver disease  . Spina  bifida Brother   . Migraines Maternal Grandmother   . Hypertension Other   . Colon cancer Paternal Grandfather     Social History Social History  Substance Use Topics  . Smoking status: Former Smoker    Packs/day: 0.25    Years: 10.00    Quit date: 08/23/2008  . Smokeless tobacco: Never Used  . Alcohol use No     Allergies   Clarithromycin; Bentyl [dicyclomine hcl]; Haldol [haloperidol]; Meclizine; Morphine and related; Toradol [ketorolac tromethamine]; Macrobid [nitrofurantoin monohyd macro]; and Sulfa antibiotics   Review of Systems Review of Systems  Constitutional: Negative for fever (100.3yesterday ).  HENT: Positive for congestion and ear pain. Negative for sore throat.  Eyes: Negative for visual disturbance.  Respiratory: Positive for cough and shortness of breath.   Cardiovascular: Positive for chest pain.  Gastrointestinal: Negative for abdominal pain, nausea and vomiting.  Genitourinary: Positive for frequency (for a few days). Negative for difficulty urinating.  Musculoskeletal: Positive for back pain and neck pain.  Skin: Negative for rash.  Neurological: Positive for dizziness. Negative for syncope.     Physical Exam Updated Vital Signs BP 143/86 (BP Location: Left Arm)   Pulse 87   Temp 98 F (36.7 C) (Oral)   Resp 20   Ht 5\' 3"  (1.6 m)   Wt 166 lb (75.3 kg)   SpO2 97%   BMI 29.41 kg/m   Physical Exam  Constitutional: She is oriented to person, place, and time. She appears well-developed and well-nourished. No distress.  HENT:  Head: Normocephalic and atraumatic.  Left TM with purulent effusion  Eyes: Conjunctivae and EOM are normal.  Neck: Normal range of motion.  Cardiovascular: Normal rate, regular rhythm, normal heart sounds and intact distal pulses.  Exam reveals no gallop and no friction rub.   No murmur heard. Pulmonary/Chest: Effort normal and breath sounds normal. No respiratory distress. She has no wheezes. She has no rales.    Abdominal: Soft. She exhibits no distension. There is no tenderness. There is no guarding.  Musculoskeletal: She exhibits no edema or tenderness.  Neurological: She is alert and oriented to person, place, and time.  Skin: Skin is warm and dry. No rash noted. She is not diaphoretic. No erythema.  Nursing note and vitals reviewed.    ED Treatments / Results  Labs (all labs ordered are listed, but only abnormal results are displayed) Labs Reviewed  CBC WITH DIFFERENTIAL/PLATELET - Abnormal; Notable for the following:       Result Value   Neutro Abs 9.4 (*)    Lymphs Abs 0.6 (*)    All other components within normal limits  COMPREHENSIVE METABOLIC PANEL - Abnormal; Notable for the following:    Glucose, Bld 117 (*)    All other components within normal limits  URINALYSIS, ROUTINE W REFLEX MICROSCOPIC - Abnormal; Notable for the following:    APPearance CLOUDY (*)    Specific Gravity, Urine 1.003 (*)    Hgb urine dipstick SMALL (*)    Leukocytes, UA MODERATE (*)    All other components within normal limits  URINALYSIS, MICROSCOPIC (REFLEX) - Abnormal; Notable for the following:    Bacteria, UA RARE (*)    Squamous Epithelial / LPF 6-30 (*)    All other components within normal limits  URINE CULTURE  TROPONIN I  D-DIMER, QUANTITATIVE (NOT AT [facility])  PREGNANCY, URINE    EKG  EKG Interpretation  Date/Time:  Thursday June 02 2016 15:43:34 EST Ventricular Rate:  83 PR Interval:  122 QRS Duration: 88 QT Interval:  346 QTC Calculation: 406 R Axis:   82 Text Interpretation:  Normal sinus rhythm Nonspecific ST abnormality Abnormal ECG No significant change since last tracing Confirmed by [facility] MD, Cinthya Bors ([id number]) on 06/02/2016 4:27:19 PM       Radiology Dg Chest 2 View  Result Date: 06/02/2016 CLINICAL DATA:  Cough, right-sided rib pain for 2 months EXAM: CHEST  2 VIEW COMPARISON:  05/30/2016 FINDINGS: The heart size and mediastinal contours are within normal limits.  Both lungs are clear. The visualized skeletal structures are unremarkable. IMPRESSION: No active cardiopulmonary disease. Electronically Signed   By: Kathreen Devoid   On: 06/02/2016 16:42   US  Venous Img Lower Unilateral Right  Result Date: 06/02/2016 CLINICAL DATA:  35 y/o  F; right lower extremity pain. EXAM: RIGHT LOWER EXTREMITY VENOUS DOPPLER ULTRASOUND TECHNIQUE: Gray-scale sonography with graded compression, as well as color Doppler and duplex ultrasound were performed to evaluate the lower extremity deep venous systems from the level of the common femoral vein and including the common femoral, femoral, profunda femoral, popliteal and calf veins including the posterior tibial, peroneal and gastrocnemius veins when visible. The superficial great saphenous vein was also interrogated. Spectral Doppler was utilized to evaluate flow at rest and with distal augmentation maneuvers in the common femoral, femoral and popliteal veins. COMPARISON:  None. FINDINGS: Contralateral Common Femoral Vein: Respiratory phasicity is normal and symmetric with the symptomatic side. No evidence of thrombus. Normal compressibility. Common Femoral Vein: No evidence of thrombus. Normal compressibility, respiratory phasicity and response to augmentation. Saphenofemoral Junction: No evidence of thrombus. Normal compressibility and flow on color Doppler imaging. Profunda Femoral Vein: No evidence of thrombus. Normal compressibility and flow on color Doppler imaging. Femoral Vein: No evidence of thrombus. Normal compressibility, respiratory phasicity and response to augmentation. Popliteal Vein: No evidence of thrombus. Normal compressibility, respiratory phasicity and response to augmentation. Calf Veins: No evidence of thrombus. Normal compressibility and flow on color Doppler imaging. Superficial Great Saphenous Vein: No evidence of thrombus. Normal compressibility and flow on color Doppler imaging. Venous Reflux:  None. Other  Findings:  None. IMPRESSION: No evidence of deep venous thrombosis. Electronically Signed   By: Kristine Garbe M.D.   On: 06/02/2016 18:10   Ct Maxillofacial Wo Contrast  Result Date: 06/02/2016 CLINICAL DATA:  Chronic sinusitis EXAM: CT MAXILLOFACIAL WITHOUT CONTRAST TECHNIQUE: Routine unenhanced axial images of the face with multiplanar reconstruction. COMPARISON:  CT head 10/04/2015 FINDINGS: Osseous: Negative for fracture. Negative for skeletal lesion. Normal bony structures. Orbits: Normal orbital contents. Globe is normal. Orbital fat is normal. Extra-ocular muscles and optic nerve normal. Sinuses: Mild mucosal edema in the floor of the right maxillary sinus. Ostiomeatal complex patent. Left maxillary sinus widely patent. Left ostiomeatal complex patent. Frontal and ethmoid sinuses clear bilaterally. Sphenoid sinus clear. Mastoid sinus clear bilaterally. Soft tissues: Normal soft tissues of the face. Limited intracranial: Negative IMPRESSION: Mild mucosal edema right maxillary sinus.  Remaining sinuses clear. Electronically Signed   By: Franchot Gallo M.D.   On: 06/02/2016 16:56    Procedures Procedures (including critical care time)  Medications Ordered in ED Medications - No data to display   Initial Impression / Assessment and Plan / ED Course  I have reviewed the triage vital signs and the nursing notes.  Pertinent labs & imaging results that were available during my care of the patient were reviewed by me and considered in my medical decision making (see chart for details).  Clinical Course     35 year old female with history htn, anxiety presents with concern of chest pain. Differential diagnosis for chest pain includes pulmonary embolus, dissection, pneumothorax, pneumonia, ACS, myocarditis, pericarditis.  EKG was done and evaluate by me and showed no acute ST changes and no signs of pericarditis. Chest x-ray was done and evaluated by me and radiology and showed no sign  of pneumonia or pneumothorax. Patient is low risk Wells with ddimer negative and have low suspicion for PE. LE US done given concern for pain and shows no sign of clot. Patient is low risk HEART score and had troponin negative x1 and given duration of symptoms, doubt ACS. Given this evaluation, history and physical  have low suspicion for pulmonary embolus, pneumonia, ACS, myocarditis, pericarditis, dissection.    Patient with significant cough, suspect viral URI as etiology with MSK pain, however recommend continued close follow up.  Patient also with left ear pain, sign of effusion on exam, will treat with augmentin. Patient discharged in stable condition with understanding of reasons to return and rec follow up with PCP.   Final Clinical Impressions(s) / ED Diagnoses   Final diagnoses:  Right leg pain  Chest pain, unspecified type  Upper respiratory tract infection, unspecified type  Left otitis media, unspecified otitis media type    New Prescriptions Discharge Medication List as of 06/02/2016  6:19 PM       Gareth Morgan, MD 06/03/16 1103

## 2016-06-02 NOTE — ED Notes (Signed)
Patient placed on cardiac monitor with vitals set to Q 30 minutes.  

## 2016-06-02 NOTE — ED Notes (Signed)
Patient is alert and oriented x3.  She was given DC instructions and follow up visit instructions.  Patient gave verbal understanding. She was DC ambulatory under her own power to home.  V/S stable.  He was not showing any signs of distress on DC 

## 2016-06-04 LAB — URINE CULTURE

## 2016-06-05 ENCOUNTER — Inpatient Hospital Stay (HOSPITAL_COMMUNITY)
Admission: AD | Admit: 2016-06-05 | Discharge: 2016-06-05 | Disposition: A | Discharge status: Home / Self Care | Payer: Medicaid Other | Source: Ambulatory Visit | Attending: Obstetrics & Gynecology | Admitting: Obstetrics & Gynecology

## 2016-06-05 ENCOUNTER — Encounter (HOSPITAL_COMMUNITY): Payer: Self-pay | Admitting: *Deleted

## 2016-06-05 DIAGNOSIS — R109 Unspecified abdominal pain: Secondary | ICD-10-CM

## 2016-06-05 DIAGNOSIS — Z87891 Personal history of nicotine dependence: Secondary | ICD-10-CM | POA: Diagnosis not present

## 2016-06-05 DIAGNOSIS — B373 Candidiasis of vulva and vagina: Secondary | ICD-10-CM | POA: Insufficient documentation

## 2016-06-05 DIAGNOSIS — M549 Dorsalgia, unspecified: Secondary | ICD-10-CM | POA: Insufficient documentation

## 2016-06-05 DIAGNOSIS — B379 Candidiasis, unspecified: Secondary | ICD-10-CM

## 2016-06-05 DIAGNOSIS — Z3202 Encounter for pregnancy test, result negative: Secondary | ICD-10-CM | POA: Diagnosis not present

## 2016-06-05 DIAGNOSIS — M7918 Myalgia, other site: Secondary | ICD-10-CM

## 2016-06-05 LAB — URINALYSIS, ROUTINE W REFLEX MICROSCOPIC
BILIRUBIN URINE: NEGATIVE
Bacteria, UA: NONE SEEN
Glucose, UA: NEGATIVE mg/dL
HGB URINE DIPSTICK: NEGATIVE
Ketones, ur: NEGATIVE mg/dL
NITRITE: NEGATIVE
PH: 7 (ref 5.0–8.0)
Protein, ur: NEGATIVE mg/dL
SPECIFIC GRAVITY, URINE: 1.003 — AB (ref 1.005–1.030)

## 2016-06-05 LAB — CBC WITH DIFFERENTIAL/PLATELET
Basophils Absolute: 0 10*3/uL (ref 0.0–0.1)
Basophils Relative: 0 %
EOS ABS: 0.1 10*3/uL (ref 0.0–0.7)
EOS PCT: 1 %
HCT: 40.5 % (ref 36.0–46.0)
HEMOGLOBIN: 13.8 g/dL (ref 12.0–15.0)
LYMPHS ABS: 1.3 10*3/uL (ref 0.7–4.0)
Lymphocytes Relative: 13 %
MCH: 30.4 pg (ref 26.0–34.0)
MCHC: 34.1 g/dL (ref 30.0–36.0)
MCV: 89.2 fL (ref 78.0–100.0)
MONOS PCT: 2 %
Monocytes Absolute: 0.3 10*3/uL (ref 0.1–1.0)
Neutro Abs: 8.6 10*3/uL — ABNORMAL HIGH (ref 1.7–7.7)
Neutrophils Relative %: 84 %
Platelets: 312 10*3/uL (ref 150–400)
RBC: 4.54 MIL/uL (ref 3.87–5.11)
RDW: 13 % (ref 11.5–15.5)
WBC: 10.3 10*3/uL (ref 4.0–10.5)

## 2016-06-05 LAB — WET PREP, GENITAL
Clue Cells Wet Prep HPF POC: NONE SEEN
Sperm: NONE SEEN
Trich, Wet Prep: NONE SEEN

## 2016-06-05 LAB — POCT PREGNANCY, URINE: PREG TEST UR: NEGATIVE

## 2016-06-05 MED ORDER — FLUCONAZOLE 150 MG PO TABS: 150.0000 mg | ORAL_TABLET | Freq: Once | ORAL | 0 refills | Status: AC

## 2016-06-05 MED ORDER — TERCONAZOLE 0.4 % VA CREA: 1.0000 | TOPICAL_CREAM | Freq: Every day | VAGINAL | 0 refills | Status: DC

## 2016-06-05 NOTE — Discharge Instructions (Signed)
See the urologist as you have planned. Will eprescribe a yeast cream to use vaginally.  Start on Monday night at bedtime. Plan to see your PCP regularly so they can be the main provider managing all your medical problems.  This is a better plan than seeing multiple providers in the ER setting.

## 2016-06-05 NOTE — MAU Note (Signed)
Has been following up with PCP for URI and finished a 6 day course of steroids and Augmentin  for ear infection. and told them about this right sided/ back and lower belly pain.  But since she is followed by Urology and has Cystoscopy scheduled for 06/06/16 as a result of kidney stone x 1 yr.  she should follow up with them.  Urine is really dark and has lots of "stuff" in it.  No matter how much is drank urine is dark.  The pain is similar to past kidney infection in the past.  Denies burning with urination

## 2016-06-05 NOTE — MAU Provider Note (Signed)
History     CSN: [id number]  Arrival date and time: 06/05/16 1544   First Provider Initiated Contact with Patient 06/05/16 1622      Chief Complaint  Patient presents with  . Back Pain   HPI Christina Fox 35 y.o. Comes to MAU today with pain in her right side at the level of the umbilicus that radiates around to her right back.  She says it feels the same as when she had a kidney infection previously.  States her urine is dark yellow even though she is drinking lots of fluids.  Has had some nausea and is not feeling well.  Has a history of being seen multiple times in the ER.  Several records reviewed.  She sees Dr. Jodi Mourning for GYN services and has a PCP also.  Tomorrow she has an appointment for a procedure at a urologist office.  Her back and side were causing her pain - she said she was teary in the car coming to the hospital.   OB History    Gravida Para Term Preterm AB Living   4 4 4     4    SAB TAB Ectopic Multiple Live Births                  Obstetric Comments   1st Menstrual Cycle:  10  1st Pregnancy:  16       Past Medical History:  Diagnosis Date  . Abdominal pain    "right lower quadrant pain and right lower back pain  . Anxiety   . Deviated septum 09/2011  . GERD (gastroesophageal reflux disease)   . Headache(784.0)    sinus  . History of echocardiogram    Echo 11/17: EF 55-60, no RWMA, normal diastolic function  . Hypertension    no meds  . Kidney stones    no current problem  . Migraine   . Nasal turbinate hypertrophy 09/2011   bilat.  . Pelvic inflammatory disease     Past Surgical History:  Procedure Laterality Date  . CHOLECYSTECTOMY N/A 01/15/2016   Procedure: LAPAROSCOPIC CHOLECYSTECTOMY WITH INTRAOPERATIVE CHOLANGIOGRAM;  Surgeon: Christene Lye, MD;  Location: [facility];  Service: General;  Laterality: N/A;  . COLONOSCOPY  2007   [facility]: pedunculated 42mm polyp in distal sigmoid, sessile 34mm polyp at splenic flexure, larger polyp  tubulovillous adenoma and smaller polyp tubular adenoma   . COLONOSCOPY  09-09-15  . COLONOSCOPY WITH PROPOFOL N/A 03/01/2016   Procedure: COLONOSCOPY WITH PROPOFOL;  Surgeon: Garlan Fair, MD;  Location: [facility];  Service: Endoscopy;  Laterality: N/A;  . NASAL SEPTOPLASTY W/ TURBINOPLASTY  10/04/2011   Procedure: NASAL SEPTOPLASTY WITH TURBINATE REDUCTION;  Surgeon: Ascencion Dike, MD;  Location: [place];  Service: ENT;  Laterality: Bilateral;  . WISDOM TOOTH EXTRACTION  2009    Family History  Problem Relation Age of Onset  . Kidney cancer Mother   . Hypertension Mother   . Migraines Mother   . Multiple sclerosis Mother   . Fibromyalgia Mother   . Liver disease Mother     NASH   . Hypertension Father   . Liver disease Father     liver transplant; had fatty liver disease  . Spina bifida Brother   . Migraines Maternal Grandmother   . Hypertension Other   . Colon cancer Paternal Grandfather     Social History  Substance Use Topics  . Smoking status: Former Smoker    Packs/day: 0.25  Years: 10.00    Quit date: 08/23/2008  . Smokeless tobacco: Never Used  . Alcohol use No    Allergies:  Allergies  Allergen Reactions  . Clarithromycin Hives and Itching  . Bentyl [Dicyclomine Hcl] Other (See Comments)    DIZZINESS  . Haldol [Haloperidol] Other (See Comments)    Made her feel very jittery and agitated  . Meclizine     Chest pain  . Morphine And Related Other (See Comments)    Made her feel like she was losing her mind  . Toradol [Ketorolac Tromethamine] Other (See Comments)    Made her feel very jittery and agitated  . Macrobid [Nitrofurantoin Monohyd Macro] Rash  . Sulfa Antibiotics Hives and Rash    Prescriptions Prior to Admission  Medication Sig Dispense Refill Last Dose  . albuterol (PROVENTIL HFA;VENTOLIN HFA) 108 (90 Base) MCG/ACT inhaler Inhale 2 puffs into the lungs every 4 (four) hours as needed for wheezing or shortness of breath. 1  Inhaler 3 Past Week at Unknown time  . amoxicillin-clavulanate (AUGMENTIN) 875-125 MG tablet Take 1 tablet by mouth 2 (two) times daily. 14 tablet 0 06/05/2016 at Unknown time  . clonazePAM (KLONOPIN) 0.5 MG tablet Take 1 tablet (0.5 mg total) by mouth daily. (Patient taking differently: Take 0.25 mg by mouth 2 (two) times daily. ) 30 tablet 5 06/05/2016 at Unknown time  . esomeprazole (NEXIUM) 40 MG capsule Take 1 capsule (40 mg total) by mouth 2 (two) times daily before a meal. 60 capsule 2 06/04/2016 at Unknown time  . fluticasone (FLONASE) 50 MCG/ACT nasal spray Place 2 sprays into both nostrils daily. (Patient taking differently: Place 2 sprays into both nostrils daily as needed for allergies. ) 16 g 6 06/05/2016 at Unknown time  . ibuprofen (ADVIL,MOTRIN) 800 MG tablet Take 1 tablet (800 mg total) by mouth 3 (three) times daily. (Patient taking differently: Take 800 mg by mouth 3 (three) times daily as needed for mild pain or moderate pain. ) 21 tablet 0 06/05/2016 at Unknown time  . levonorgestrel-ethinyl estradiol (AUBRA) 0.1-20 MG-MCG tablet Take 1 tablet by mouth daily. Reported on 12/04/2015   06/05/2016 at Unknown time  . loratadine (CLARITIN) 10 MG tablet Take 10 mg by mouth daily.   06/04/2016 at Unknown time  . montelukast (SINGULAIR) 10 MG tablet TAKE 1 TABLET BY MOUTH DAILY (Patient taking differently: TAKE 10mg  TABLET BY MOUTH DAILY) 30 tablet 2 06/05/2016 at Unknown time  . potassium chloride SA (K-DUR,KLOR-CON) 20 MEQ tablet Take 20 mEq by mouth 2 (two) times a week.   06/04/2016 at Unknown time  . scopolamine (TRANSDERM-SCOP) 1 MG/3DAYS Place 1 patch onto the skin every 3 (three) days.  0 Past Month at Unknown time  . guaiFENesin (ROBITUSSIN) 100 MG/5ML liquid Take 5-10 mLs (100-200 mg total) by mouth every 4 (four) hours as needed for cough. (Patient not taking: Reported on 06/05/2016) 60 mL 0 Not Taking at Unknown time  . predniSONE (DELTASONE) 20 MG tablet Take 2 tablets (40 mg total)  by mouth daily. (Patient not taking: Reported on 06/05/2016) 10 tablet 0 Not Taking at Unknown time  . predniSONE (STERAPRED UNI-PAK 21 TAB) 10 MG (21) TBPK tablet Take 10 mg by mouth daily.   Completed Course at Unknown time  . Prenat w/o A-[id number]-[id number]-FA-DHA (CITRANATAL HARMONY) 29-1-265 MG CAPS Take 1 capsule by mouth daily before breakfast. (Patient not taking: Reported on 06/05/2016) 30 capsule 11 Not Taking at Unknown time    Review of Systems  Constitutional:  Negative for fever.  Gastrointestinal: Positive for abdominal pain and nausea. Negative for vomiting.  Genitourinary:       No vaginal discharge. No vaginal bleeding. No dysuria.  Musculoskeletal: Positive for back pain.   Physical Exam   Blood pressure 134/81, pulse 104, temperature 98.3 F (36.8 C), temperature source Oral, resp. rate 20, height 5\' 3"  (1.6 m), weight 166 lb (75.3 kg).  Physical Exam  Nursing note and vitals reviewed. Constitutional: She is oriented to person, place, and time. She appears well-developed and well-nourished.  HENT:  Head: Normocephalic.  Eyes: EOM are normal.  Neck: Neck supple.  GI: Soft. There is tenderness. There is no rebound and no guarding.  Mild tenderness with deep palpation in RLQ and RUQ  Musculoskeletal: Normal range of motion.  Has pain in multiple places on her back - mild in left flank area - more prominent in right flank, but also has pain in midline on her spine in area below the waist.  Neurological: She is alert and oriented to person, place, and time.  Skin: Skin is warm and dry.  Psychiatric: She has a normal mood and affect.    MAU Course  Procedures Results for orders placed or performed during the hospital encounter of 06/05/16 (from the past 24 hour(s))  Urinalysis, Routine w reflex microscopic     Status: Abnormal   Collection Time: 06/05/16  3:50 PM  Result Value Ref Range   Color, Urine STRAW (A) YELLOW   APPearance HAZY (A) CLEAR   Specific Gravity,  Urine 1.003 (L) 1.005 - 1.030   pH 7.0 5.0 - 8.0   Glucose, UA NEGATIVE NEGATIVE mg/dL   Hgb urine dipstick NEGATIVE NEGATIVE   Bilirubin Urine NEGATIVE NEGATIVE   Ketones, ur NEGATIVE NEGATIVE mg/dL   Protein, ur NEGATIVE NEGATIVE mg/dL   Nitrite NEGATIVE NEGATIVE   Leukocytes, UA MODERATE (A) NEGATIVE   RBC / HPF 6-30 0 - 5 RBC/hpf   WBC, UA 0-5 0 - 5 WBC/hpf   Bacteria, UA NONE SEEN NONE SEEN   Squamous Epithelial / LPF 6-30 (A) NONE SEEN  Pregnancy, urine POC     Status: None   Collection Time: 06/05/16  4:25 PM  Result Value Ref Range   Preg Test, Ur NEGATIVE NEGATIVE  CBC with Differential/Platelet     Status: Abnormal   Collection Time: 06/05/16  4:47 PM  Result Value Ref Range   WBC 10.3 4.0 - 10.5 K/uL   RBC 4.54 3.87 - 5.11 MIL/uL   Hemoglobin 13.8 12.0 - 15.0 g/dL   HCT 40.5 36.0 - 46.0 %   MCV 89.2 78.0 - 100.0 fL   MCH 30.4 26.0 - 34.0 pg   MCHC 34.1 30.0 - 36.0 g/dL   RDW 13.0 11.5 - 15.5 %   Platelets 312 150 - 400 K/uL   Neutrophils Relative % 84 %   Neutro Abs 8.6 (H) 1.7 - 7.7 K/uL   Lymphocytes Relative 13 %   Lymphs Abs 1.3 0.7 - 4.0 K/uL   Monocytes Relative 2 %   Monocytes Absolute 0.3 0.1 - 1.0 K/uL   Eosinophils Relative 1 %   Eosinophils Absolute 0.1 0.0 - 0.7 K/uL   Basophils Relative 0 %   Basophils Absolute 0.0 0.0 - 0.1 K/uL  Wet prep, genital     Status: Abnormal   Collection Time: 06/05/16  5:54 PM  Result Value Ref Range   Yeast Wet Prep HPF POC PRESENT (A) NONE SEEN   Trich, Wet  Prep NONE SEEN NONE SEEN   Clue Cells Wet Prep HPF POC NONE SEEN NONE SEEN   WBC, Wet Prep HPF POC FEW (A) NONE SEEN   Sperm NONE SEEN     MDM Has been seen multiple times in the ER since October 2017.  Today's labs are essentially the same as the ones she has had in recent visits.  Explained a clean catch to client with explicit details on keeping the labia apart for cleansing and for collection of midstream specimen.  Client reports she did that exactly as  described.  Will collect vaginal swabs for wet prep and GC/Chlam as those labs have not been done in several months.  Possible vaginal contamination of urine specimens.  Client has an appointment tomorrow with a urologist and encouraged her to keep that appointment.  She seems much improved from the respiratory problems and viral illness she has been having - no cough, no rhinitis, no congestion noted today.  Client does not think the yeast cream will work for her and requests Diflucan also.  Eprescribed one Diflucan tablet along with the Terazol cream.  Advised that she can use the cream if the Diflucan tablet does not clear her yeast infection.  Assessment and Plan  Vaginal Yeast infection likely secondary to antibiotics Abdominal pain and back pain - unknown cause - likely musculoskeletal in origin.  Plan See the urologist as you have planned - there are some RBC in her urine - this could be from the yeast infection or another cause which has not yet been identified. Will eprescribe a yeast cream to use vaginally.  Start on Monday night at bedtime. Plan to see your PCP regularly so they can be the main provider managing all your medical problems.  This is a better plan than seeing multiple providers in the ER setting.  Christina Fox L Christina Fox 06/05/2016, 6:16 PM

## 2016-06-06 ENCOUNTER — Other Ambulatory Visit: Payer: Self-pay | Admitting: Osteopathic Medicine

## 2016-06-06 DIAGNOSIS — R109 Unspecified abdominal pain: Secondary | ICD-10-CM

## 2016-06-09 ENCOUNTER — Encounter (HOSPITAL_COMMUNITY): Payer: Self-pay | Admitting: *Deleted

## 2016-06-09 ENCOUNTER — Inpatient Hospital Stay (HOSPITAL_COMMUNITY): Payer: Medicaid Other

## 2016-06-09 ENCOUNTER — Inpatient Hospital Stay (HOSPITAL_COMMUNITY)
Admission: AD | Admit: 2016-06-09 | Discharge: 2016-06-09 | Disposition: A | Discharge status: Home / Self Care | Payer: Medicaid Other | Source: Ambulatory Visit | Attending: Obstetrics and Gynecology | Admitting: Obstetrics and Gynecology

## 2016-06-09 DIAGNOSIS — R109 Unspecified abdominal pain: Secondary | ICD-10-CM

## 2016-06-09 DIAGNOSIS — Z3202 Encounter for pregnancy test, result negative: Secondary | ICD-10-CM | POA: Diagnosis not present

## 2016-06-09 DIAGNOSIS — Z87891 Personal history of nicotine dependence: Secondary | ICD-10-CM | POA: Insufficient documentation

## 2016-06-09 DIAGNOSIS — R1031 Right lower quadrant pain: Secondary | ICD-10-CM | POA: Diagnosis present

## 2016-06-09 LAB — URINALYSIS, ROUTINE W REFLEX MICROSCOPIC
BILIRUBIN URINE: NEGATIVE
GLUCOSE, UA: NEGATIVE mg/dL
HGB URINE DIPSTICK: NEGATIVE
KETONES UR: NEGATIVE mg/dL
NITRITE: NEGATIVE
PROTEIN: NEGATIVE mg/dL
Specific Gravity, Urine: 1.018 (ref 1.005–1.030)
pH: 5 (ref 5.0–8.0)

## 2016-06-09 LAB — POCT PREGNANCY, URINE: PREG TEST UR: NEGATIVE

## 2016-06-09 NOTE — Discharge Instructions (Signed)

## 2016-06-09 NOTE — MAU Provider Note (Signed)
History     CSN: [id number]  Arrival date and time: 06/09/16 2011   First Provider Initiated Contact with Patient 06/09/16 2219      Chief Complaint  Patient presents with  . Abdominal Pain   Abdominal Pain  This is a new problem. The current episode started in the past 7 days. The onset quality is gradual. The problem occurs constantly. The problem has been gradually worsening. The pain is located in the RLQ. The pain is at a severity of 7/10. The quality of the pain is aching and cramping. The abdominal pain radiates to the pelvis, back and periumbilical region. Pertinent negatives include no constipation, diarrhea, dysuria, fever, frequency, nausea or vomiting. Nothing aggravates the pain. The pain is relieved by being still. Treatments tried: ibuprofen, heating pad, warm baths.  The treatment provided no relief.   Past Medical History:  Diagnosis Date  . Abdominal pain    "right lower quadrant pain and right lower back pain  . Anxiety   . Deviated septum 09/2011  . GERD (gastroesophageal reflux disease)   . Headache(784.0)    sinus  . History of echocardiogram    Echo 11/17: EF 55-60, no RWMA, normal diastolic function  . Hypertension    no meds  . Kidney stones    no current problem  . Migraine   . Nasal turbinate hypertrophy 09/2011   bilat.  . Pelvic inflammatory disease     Past Surgical History:  Procedure Laterality Date  . CHOLECYSTECTOMY N/A 01/15/2016   Procedure: LAPAROSCOPIC CHOLECYSTECTOMY WITH INTRAOPERATIVE CHOLANGIOGRAM;  Surgeon: Christene Lye, MD;  Location: [facility];  Service: General;  Laterality: N/A;  . COLONOSCOPY  2007   [facility]: pedunculated 41mm polyp in distal sigmoid, sessile 41mm polyp at splenic flexure, larger polyp tubulovillous adenoma and smaller polyp tubular adenoma   . COLONOSCOPY  09-09-15  . COLONOSCOPY WITH PROPOFOL N/A 03/01/2016   Procedure: COLONOSCOPY WITH PROPOFOL;  Surgeon: Garlan Fair, MD;  Location: [facility];   Service: Endoscopy;  Laterality: N/A;  . NASAL SEPTOPLASTY W/ TURBINOPLASTY  10/04/2011   Procedure: NASAL SEPTOPLASTY WITH TURBINATE REDUCTION;  Surgeon: Ascencion Dike, MD;  Location: [place];  Service: ENT;  Laterality: Bilateral;  . WISDOM TOOTH EXTRACTION  2009    Family History  Problem Relation Age of Onset  . Kidney cancer Mother   . Hypertension Mother   . Migraines Mother   . Multiple sclerosis Mother   . Fibromyalgia Mother   . Liver disease Mother     NASH   . Hypertension Father   . Liver disease Father     liver transplant; had fatty liver disease  . Spina bifida Brother   . Migraines Maternal Grandmother   . Hypertension Other   . Colon cancer Paternal Grandfather     Social History  Substance Use Topics  . Smoking status: Former Smoker    Packs/day: 0.25    Years: 10.00    Quit date: 08/23/2008  . Smokeless tobacco: Never Used  . Alcohol use No    Allergies:  Allergies  Allergen Reactions  . Clarithromycin Hives and Itching  . Bentyl [Dicyclomine Hcl] Other (See Comments)    DIZZINESS  . Haldol [Haloperidol] Other (See Comments)    Made her feel very jittery and agitated  . Meclizine     Chest pain  . Morphine And Related Other (See Comments)    Made her feel like she was losing her mind  .  Toradol [Ketorolac Tromethamine] Other (See Comments)    Made her feel very jittery and agitated  . Macrobid [Nitrofurantoin Monohyd Macro] Rash  . Sulfa Antibiotics Hives and Rash    Prescriptions Prior to Admission  Medication Sig Dispense Refill Last Dose  . albuterol (PROVENTIL HFA;VENTOLIN HFA) 108 (90 Base) MCG/ACT inhaler Inhale 2 puffs into the lungs every 4 (four) hours as needed for wheezing or shortness of breath. 1 Inhaler 3 Past Month at Unknown time  . amoxicillin-clavulanate (AUGMENTIN) 875-125 MG tablet Take 1 tablet by mouth 2 (two) times daily. 14 tablet 0 06/09/2016 at Unknown time  . clonazePAM (KLONOPIN) 0.5 MG tablet Take 1  tablet (0.5 mg total) by mouth daily. (Patient taking differently: Take 0.25 mg by mouth 2 (two) times daily. ) 30 tablet 5 06/09/2016 at Unknown time  . esomeprazole (NEXIUM) 40 MG capsule Take 1 capsule (40 mg total) by mouth 2 (two) times daily before a meal. 60 capsule 2 06/09/2016 at Unknown time  . fluticasone (FLONASE) 50 MCG/ACT nasal spray Place 2 sprays into both nostrils daily. (Patient taking differently: Place 2 sprays into both nostrils daily as needed for allergies. ) 16 g 6 06/09/2016 at Unknown time  . ibuprofen (ADVIL,MOTRIN) 800 MG tablet Take 1 tablet (800 mg total) by mouth 3 (three) times daily. (Patient taking differently: Take 800 mg by mouth 3 (three) times daily as needed for mild pain or moderate pain. ) 21 tablet 0 06/09/2016 at Unknown time  . levonorgestrel-ethinyl estradiol (AUBRA) 0.1-20 MG-MCG tablet Take 1 tablet by mouth daily. Reported on 12/04/2015   06/09/2016 at Unknown time  . loratadine (CLARITIN) 10 MG tablet Take 10 mg by mouth daily.   06/09/2016 at Unknown time  . montelukast (SINGULAIR) 10 MG tablet TAKE 1 TABLET BY MOUTH DAILY (Patient taking differently: TAKE 10mg  TABLET BY MOUTH DAILY) 30 tablet 2 06/09/2016 at Unknown time  . Multiple Vitamins-Minerals (MULTIVITAMIN PO) Take 1 tablet by mouth every morning.   06/09/2016 at Unknown time  . potassium chloride SA (K-DUR,KLOR-CON) 20 MEQ tablet Take 20 mEq by mouth 2 (two) times a week.   06/08/2016 at Unknown time  . terconazole (TERAZOL 7) 0.4 % vaginal cream Place 1 applicator vaginally at bedtime. Use for 7 days. (Patient not taking: Reported on 06/09/2016) 45 g 0 Not Taking at Unknown time    Review of Systems  Constitutional: Negative for chills and fever.  Gastrointestinal: Positive for abdominal pain. Negative for constipation, diarrhea, nausea and vomiting.  Genitourinary: Negative for dysuria, frequency and urgency.  Musculoskeletal: Positive for back pain.   Physical Exam   Blood pressure  139/86, pulse 96, temperature 98.3 F (36.8 C), resp. rate 18.  Physical Exam  Nursing note and vitals reviewed. Constitutional: She appears well-developed and well-nourished. No distress.  HENT:  Head: Normocephalic.  Cardiovascular: Normal rate.   Respiratory: Effort normal.  GI: Soft. She exhibits no mass. There is tenderness (in RUQ, and along side. ). There is no rebound.  Genitourinary:  Genitourinary Comments: No CVA tenderness   Skin: Skin is warm and dry.  Psychiatric: She has a normal mood and affect.   Results for orders placed or performed during the hospital encounter of 06/09/16 (from the past 24 hour(s))  Urinalysis, Routine w reflex microscopic     Status: Abnormal   Collection Time: 06/09/16  8:30 PM  Result Value Ref Range   Color, Urine YELLOW YELLOW   APPearance HAZY (A) CLEAR   Specific Gravity, Urine 1.018  1.005 - 1.030   pH 5.0 5.0 - 8.0   Glucose, UA NEGATIVE NEGATIVE mg/dL   Hgb urine dipstick NEGATIVE NEGATIVE   Bilirubin Urine NEGATIVE NEGATIVE   Ketones, ur NEGATIVE NEGATIVE mg/dL   Protein, ur NEGATIVE NEGATIVE mg/dL   Nitrite NEGATIVE NEGATIVE   Leukocytes, UA SMALL (A) NEGATIVE   RBC / HPF 0-5 0 - 5 RBC/hpf   WBC, UA 6-30 0 - 5 WBC/hpf   Bacteria, UA RARE (A) NONE SEEN   Squamous Epithelial / LPF 6-30 (A) NONE SEEN   Mucous PRESENT   Pregnancy, urine POC     Status: None   Collection Time: 06/09/16  8:35 PM  Result Value Ref Range   Preg Test, Ur NEGATIVE NEGATIVE   US Transvaginal Non-ob  Result Date: 06/09/2016 CLINICAL DATA:  35 year old female with right pelvic pain. EXAM: TRANSABDOMINAL AND TRANSVAGINAL ULTRASOUND OF PELVIS TECHNIQUE: Both transabdominal and transvaginal ultrasound examinations of the pelvis were performed. Transabdominal technique was performed for global imaging of the pelvis including uterus, ovaries, adnexal regions, and pelvic cul-de-sac. It was necessary to proceed with endovaginal exam following the transabdominal  exam to visualize the endometrium and the ovaries. COMPARISON:  Pelvic ultrasound dated 03/25/2016 an abdominal CT dated 02/17/2016 FINDINGS: Uterus Measurements: 7.3 x 3.7 x 4.0 cm. The uterus is anteverted and heterogeneous. No fibroids or other mass visualized. Endometrium Thickness: 80 mm.  No focal abnormality visualized. Right ovary Measurements: 2.5 x 1.5 x 2.7 cm. Normal appearance/no adnexal mass. Left ovary Measurements: 2.7 x 1.3 x 1.3 cm. Normal appearance/no adnexal mass. Other findings No abnormal free fluid. IMPRESSION: Unremarkable pelvic ultrasound. Electronically Signed   By: Anner Crete M.D.   On: 06/09/2016 23:04   US Pelvis Complete  Result Date: 06/09/2016 CLINICAL DATA:  35 year old female with right pelvic pain. EXAM: TRANSABDOMINAL AND TRANSVAGINAL ULTRASOUND OF PELVIS TECHNIQUE: Both transabdominal and transvaginal ultrasound examinations of the pelvis were performed. Transabdominal technique was performed for global imaging of the pelvis including uterus, ovaries, adnexal regions, and pelvic cul-de-sac. It was necessary to proceed with endovaginal exam following the transabdominal exam to visualize the endometrium and the ovaries. COMPARISON:  Pelvic ultrasound dated 03/25/2016 an abdominal CT dated 02/17/2016 FINDINGS: Uterus Measurements: 7.3 x 3.7 x 4.0 cm. The uterus is anteverted and heterogeneous. No fibroids or other mass visualized. Endometrium Thickness: 80 mm.  No focal abnormality visualized. Right ovary Measurements: 2.5 x 1.5 x 2.7 cm. Normal appearance/no adnexal mass. Left ovary Measurements: 2.7 x 1.3 x 1.3 cm. Normal appearance/no adnexal mass. Other findings No abnormal free fluid. IMPRESSION: Unremarkable pelvic ultrasound. Electronically Signed   By: Anner Crete M.D.   On: 06/09/2016 23:04    MAU Course  Procedures  MDM   Assessment and Plan   1. Abdominal pain    DC home Comfort measures reviewed  RX: none  FU with PCP, consider GI  referral   Follow-up Information    [facility] Follow up.   Contact information: [address] [id number] mc           Mathis Bud 06/09/2016, 10:23 PM

## 2016-06-09 NOTE — MAU Note (Signed)
Pt presents to MAU with complaints of pain in the right lower side of her abdomen radiating to the middle of her abdomen. Pt states she was evaluated here on Sunday for the same complaints. Denies any vaginal bleeding or abnormal discharge

## 2016-06-10 NOTE — ED Provider Notes (Signed)
[facility] DEPT Provider Note   CSN: [id number] Arrival date & time: 05/30/16  1839    History   Chief Complaint Chief Complaint  Patient presents with  . Chest Pain    HPI Christina Fox is a 35 y.o. female.  Patient with frequent visits to the ED for c/o chest pain   The history is provided by the patient. No language interpreter was used.  Chest Pain   This is a new problem. The current episode started 3 to 5 hours ago. Episode frequency: intermittently. Progression since onset: no chest pain at present. Associated with: no associated symptoms. The pain is mild. Quality: tingling and soreness. Radiates to: nonradiating, but present "all over body" Pertinent negatives include no fever, no hemoptysis, no near-syncope, no syncope and no vomiting. She has tried nothing for the symptoms.  Her past medical history is significant for hypertension.    Past Medical History:  Diagnosis Date  . Abdominal pain    "right lower quadrant pain and right lower back pain  . Anxiety   . Deviated septum 09/2011  . GERD (gastroesophageal reflux disease)   . Headache(784.0)    sinus  . History of echocardiogram    Echo 11/17: EF 55-60, no RWMA, normal diastolic function  . Hypertension    no meds  . Kidney stones    no current problem  . Migraine   . Nasal turbinate hypertrophy 09/2011   bilat.  . Pelvic inflammatory disease     Patient Active Problem List   Diagnosis Date Noted  . Gallstone 01/07/2016  . History of colonic polyps 11/27/2015  . Right lower quadrant abdominal pain 11/27/2015  . HTN (hypertension), benign 11/24/2015  . Abdominal pain 11/16/2015  . Headache 10/13/2015  . 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 Anxiety disorder 11/03/2014    Past Surgical History:  Procedure Laterality Date  . CHOLECYSTECTOMY N/A 01/15/2016   Procedure: LAPAROSCOPIC CHOLECYSTECTOMY WITH INTRAOPERATIVE CHOLANGIOGRAM;  Surgeon: Christene Lye, MD;  Location: [facility] OR[facility];  Service:  General;  Laterality: N/A;  . COLONOSCOPY  2007   [facility]: pedunculated 50mm polyp in distal sigmoid, sessile 70mm polyp at splenic flexure, larger polyp tubulovillous adenoma and smaller polyp tubular adenoma   . COLONOSCOPY  09-09-15  . COLONOSCOPY WITH PROPOFOL N/A 03/01/2016   Procedure: COLONOSCOPY WITH PROPOFOL;  Surgeon: Garlan Fair, MD;  Location: [facility];  Service: Endoscopy;  Laterality: N/A;  . NASAL SEPTOPLASTY W/ TURBINOPLASTY  10/04/2011   Procedure: NASAL SEPTOPLASTY WITH TURBINATE REDUCTION;  Surgeon: Ascencion Dike, MD;  Location: [facility];  Service: ENT;  Laterality: Bilateral;  . WISDOM TOOTH EXTRACTION  2009    OB History    Gravida Para Term Preterm AB Living   4 4 4     4    SAB TAB Ectopic Multiple Live Births           4      Obstetric Comments   1st Menstrual Cycle:  10  1st Pregnancy:  16        Home Medications    Prior to Admission medications   Medication Sig Start Date End Date Taking? Authorizing Provider  albuterol (PROVENTIL HFA;VENTOLIN HFA) 108 (90 Base) MCG/ACT inhaler Inhale 2 puffs into the lungs every 4 (four) hours as needed for wheezing or shortness of breath. 05/22/16  Yes Noemi Chapel, MD  clonazePAM (KLONOPIN) 0.5 MG tablet Take 1 tablet (0.5 mg total) by mouth daily. Patient taking differently: Take 0.25 mg by  mouth 2 (two) times daily.  10/20/15  Yes Robyn Haber, MD  esomeprazole (NEXIUM) 40 MG capsule Take 1 capsule (40 mg total) by mouth 2 (two) times daily before a meal. 04/29/16  Yes Scott T Kathlen Mody, PA-C  fluticasone (FLONASE) 50 MCG/ACT nasal spray Place 2 sprays into both nostrils daily. Patient taking differently: Place 2 sprays into both nostrils daily as needed for allergies.  12/22/15  Yes Maren Reamer, MD  ibuprofen (ADVIL,MOTRIN) 800 MG tablet Take 1 tablet (800 mg total) by mouth 3 (three) times daily. Patient taking differently: Take 800 mg by mouth 3 (three) times daily as needed for mild pain or  moderate pain.  03/05/16  Yes Keitha Butte, CNM  levonorgestrel-ethinyl estradiol (AUBRA) 0.1-20 MG-MCG tablet Take 1 tablet by mouth daily. Reported on 12/04/2015   Yes Historical Provider, MD  loratadine (CLARITIN) 10 MG tablet Take 10 mg by mouth daily. 05/26/16 05/26/17 Yes Historical Provider, MD  montelukast (SINGULAIR) 10 MG tablet TAKE 1 TABLET BY MOUTH DAILY Patient taking differently: TAKE 10mg  TABLET BY MOUTH DAILY 01/18/16  Yes Maren Reamer, MD  potassium chloride SA (K-DUR,KLOR-CON) 20 MEQ tablet Take 20 mEq by mouth 2 (two) times a week.   Yes Historical Provider, MD  Multiple Vitamins-Minerals (MULTIVITAMIN PO) Take 1 tablet by mouth every morning.    Historical Provider, MD    Family History Family History  Problem Relation Age of Onset  . Kidney cancer Mother   . Hypertension Mother   . Migraines Mother   . Multiple sclerosis Mother   . Fibromyalgia Mother   . Liver disease Mother     NASH   . Hypertension Father   . Liver disease Father     liver transplant; had fatty liver disease  . Spina bifida Brother   . Migraines Maternal Grandmother   . Hypertension Other   . Colon cancer Paternal Grandfather     Social History Social History  Substance Use Topics  . Smoking status: Former Smoker    Packs/day: 0.25    Years: 10.00    Quit date: 08/23/2008  . Smokeless tobacco: Never Used  . Alcohol use No     Allergies   Clarithromycin; Bentyl [dicyclomine hcl]; Haldol [haloperidol]; Meclizine; Morphine and related; Toradol [ketorolac tromethamine]; Macrobid [nitrofurantoin monohyd macro]; and Sulfa antibiotics   Review of Systems Review of Systems  Constitutional: Negative for fever.  Respiratory: Negative for hemoptysis.   Cardiovascular: Positive for chest pain. Negative for syncope and near-syncope.  Gastrointestinal: Negative for vomiting.   Ten systems reviewed and are negative for acute change, except as noted in the HPI.    Physical Exam Updated  Vital Signs BP 144/92 (BP Location: Right Arm)   Pulse 81   Temp 99 F (37.2 C) (Oral)   Resp 21   Ht 5\' 3"  (1.6 m)   Wt 75.3 kg   SpO2 100%   BMI 29.41 kg/m   Physical Exam  Constitutional: She is oriented to person, place, and time. She appears well-developed and well-nourished. No distress.  Nontoxic and in no acute distress  HENT:  Head: Normocephalic and atraumatic.  Eyes: Conjunctivae and EOM are normal. No scleral icterus.  Neck: Normal range of motion.  Cardiovascular: Normal rate, regular rhythm and intact distal pulses.   Pulmonary/Chest: Effort normal. No respiratory distress. She has no wheezes. She has no rales.  Lungs clear to auscultation bilaterally  Musculoskeletal: Normal range of motion. She exhibits no edema.  No peripheral edema.  Neurological: She is alert and oriented to person, place, and time. She exhibits normal muscle tone. Coordination normal.  GCS 15. Patient moving all extremities.  Skin: Skin is warm and dry. No rash noted. She is not diaphoretic. No erythema. No pallor.  Psychiatric: She has a normal mood and affect. Her behavior is normal.  Nursing note and vitals reviewed.    ED Treatments / Results  Labs (all labs ordered are listed, but only abnormal results are displayed) Labs Reviewed  BASIC METABOLIC PANEL - Abnormal; Notable for the following:       Result Value   Potassium 2.8 (*)    Glucose, Bld 106 (*)    All other components within normal limits  CBC  RAPID URINE DRUG SCREEN, HOSP PERFORMED  I-STAT TROPOININ, ED    EKG  EKG Interpretation  Date/Time:  Monday May 30 2016 18:49:09 EST Ventricular Rate:  99 PR Interval:    QRS Duration: 86 QT Interval:  353 QTC Calculation: 453 R Axis:   65 Text Interpretation:  Sinus rhythm Probable left atrial enlargement Probable LVH with secondary repol abnrm ST depr, consider ischemia, inferior leads No significant change since last tracing Confirmed by LITTLE MD, RACHEL  [id number]) on 05/31/2016 4:06:42 PM       Radiology  Chest x-ray reviewed by myself. Negative for acute cardiopulmonary process. Lungs clear. Heart size normal.   Procedures Procedures (including critical care time)  Medications Ordered in ED Medications  potassium chloride SA (K-DUR,KLOR-CON) CR tablet 60 mEq (60 mEq Oral Given 05/30/16 2259)     Initial Impression / Assessment and Plan / ED Course  I have reviewed the triage vital signs and the nursing notes.  Pertinent labs & imaging results that were available during my care of the patient were reviewed by me and considered in my medical decision making (see chart for details).  Clinical Course     35 year old female with a history of hypertension presents to the emergency department for complaints of chest pain. She has been seen in the emergency department a number of times in the past for similar symptoms. Patient with a reassuring workup today. Symptoms atypical for cardiac etiology. No concern for dissection. Also doubt pulmonary embolus. Patient stable for outpatient follow-up. I do not believe further emergent workup is indicated. Patient discharged in stable condition with no unaddressed concerns.   Final Clinical Impressions(s) / ED Diagnoses   Final diagnoses:  Nonspecific chest pain    New Prescriptions Discharge Medication List as of 05/30/2016 11:02 PM       Antonietta Breach, PA-C 06/10/16 [id number]    Quintella Reichert, MD 06/12/16 [phone]

## 2016-06-13 ENCOUNTER — Encounter (HOSPITAL_COMMUNITY): Payer: Self-pay | Admitting: Neurology

## 2016-06-13 ENCOUNTER — Emergency Department (HOSPITAL_COMMUNITY)
Admission: EM | Admit: 2016-06-13 | Discharge: 2016-06-13 | Disposition: A | Discharge status: Home / Self Care | Payer: Medicaid Other | Attending: Emergency Medicine | Admitting: Emergency Medicine

## 2016-06-13 ENCOUNTER — Other Ambulatory Visit: Payer: Self-pay

## 2016-06-13 ENCOUNTER — Emergency Department (HOSPITAL_COMMUNITY): Payer: Medicaid Other

## 2016-06-13 DIAGNOSIS — R0781 Pleurodynia: Secondary | ICD-10-CM | POA: Diagnosis not present

## 2016-06-13 DIAGNOSIS — M549 Dorsalgia, unspecified: Secondary | ICD-10-CM | POA: Insufficient documentation

## 2016-06-13 DIAGNOSIS — R140 Abdominal distension (gaseous): Secondary | ICD-10-CM | POA: Insufficient documentation

## 2016-06-13 DIAGNOSIS — Z87891 Personal history of nicotine dependence: Secondary | ICD-10-CM | POA: Diagnosis not present

## 2016-06-13 DIAGNOSIS — M25512 Pain in left shoulder: Secondary | ICD-10-CM | POA: Insufficient documentation

## 2016-06-13 DIAGNOSIS — R079 Chest pain, unspecified: Secondary | ICD-10-CM | POA: Diagnosis present

## 2016-06-13 DIAGNOSIS — I10 Essential (primary) hypertension: Secondary | ICD-10-CM | POA: Insufficient documentation

## 2016-06-13 LAB — CBC
HCT: 38.8 % (ref 36.0–46.0)
HEMOGLOBIN: 13.2 g/dL (ref 12.0–15.0)
MCH: 30.3 pg (ref 26.0–34.0)
MCHC: 34 g/dL (ref 30.0–36.0)
MCV: 89 fL (ref 78.0–100.0)
PLATELETS: 301 10*3/uL (ref 150–400)
RBC: 4.36 MIL/uL (ref 3.87–5.11)
RDW: 12.8 % (ref 11.5–15.5)
WBC: 7.6 10*3/uL (ref 4.0–10.5)

## 2016-06-13 LAB — COMPREHENSIVE METABOLIC PANEL
ALBUMIN: 4.2 g/dL (ref 3.5–5.0)
ALT: 25 U/L (ref 14–54)
AST: 21 U/L (ref 15–41)
Alkaline Phosphatase: 39 U/L (ref 38–126)
Anion gap: 11 (ref 5–15)
BUN: <5 mg/dL — ABNORMAL LOW (ref 6–20)
CHLORIDE: 105 mmol/L (ref 101–111)
CO2: 23 mmol/L (ref 22–32)
Calcium: 9.5 mg/dL (ref 8.9–10.3)
Creatinine, Ser: 0.69 mg/dL (ref 0.44–1.00)
GFR calc non Af Amer: >60 mL/min (ref >60)
Glucose, Bld: 81 mg/dL (ref 65–99)
Potassium: 3.1 mmol/L — ABNORMAL LOW (ref 3.5–5.1)
SODIUM: 139 mmol/L (ref 135–145)
Total Bilirubin: 0.8 mg/dL (ref 0.3–1.2)
Total Protein: 6.9 g/dL (ref 6.5–8.1)

## 2016-06-13 LAB — URINALYSIS, ROUTINE W REFLEX MICROSCOPIC
Bilirubin Urine: NEGATIVE
GLUCOSE, UA: NEGATIVE mg/dL
HGB URINE DIPSTICK: NEGATIVE
Ketones, ur: 5 mg/dL — AB
Leukocytes, UA: NEGATIVE
Nitrite: NEGATIVE
Protein, ur: NEGATIVE mg/dL
SPECIFIC GRAVITY, URINE: 1.006 (ref 1.005–1.030)
pH: 7 (ref 5.0–8.0)

## 2016-06-13 LAB — LIPASE, BLOOD: LIPASE: 24 U/L (ref 11–51)

## 2016-06-13 LAB — I-STAT TROPONIN, ED: Troponin i, poc: 0 ng/mL (ref 0.00–0.08)

## 2016-06-13 LAB — I-STAT BETA HCG BLOOD, ED (MC, WL, AP ONLY): I-stat hCG, quantitative: <5 m[IU]/mL (ref <5)

## 2016-06-13 NOTE — ED Notes (Signed)
Patient transported to X-ray 

## 2016-06-13 NOTE — ED Notes (Signed)
Pt departed in NAD, escorted by this RN.  

## 2016-06-13 NOTE — ED Provider Notes (Signed)
[facility] DEPT Provider Note   CSN: [id number] Arrival date & time: 06/13/16  1546     History   Chief Complaint Chief Complaint  Patient presents with  . Chest Pain    HPI Christina Fox is a 35 y.o. female.  HPI  The patient is a 35 year old female with a history of acid reflux and cholecystectomy, presents with some abdominal distention and bloating despite having 4 large bowel movements today which were of normal caliber. According to the medical record the patient has been seen multiple times over the last couple of months including November 26, December 4, December 7, December 14 and now today. Each of these were for some combination of either chest discomfort or abdominal bloating. The patient reports that she has had multiple tests done including blood work, x-rays and urinalysis none of which show a definite answer. She was most recently at [facility] 4 days ago during which time she had an ultrasound of the pelvis which was read as unremarkable. She also had DVT scanning of her leg on the right, a negative d-dimer, negative chest x-rays and also reports a negative cystoscopy due to some microscopic hematuria. She now states that her abdomen feels bloated, she had breakfast but has not eaten since, she has had no urinary symptoms today, describes a one month long period of chest pain which is pleuritic in nature and has been both on the right side on the left side of the rib border, has been told that she has costochondritis. She does not have a cardiac history. She had an echocardiogram last month with a normal ejection fraction with no abnormal wall motion abnormalities and normal diastolic function.  The patient does not have chest pain on exertion, she does report that she has been coughing for the last month.  Past Medical History:  Diagnosis Date  . Abdominal pain    "right lower quadrant pain and right lower back pain  . Anxiety   . Deviated septum 09/2011  .  GERD (gastroesophageal reflux disease)   . Headache(784.0)    sinus  . History of echocardiogram    Echo 11/17: EF 55-60, no RWMA, normal diastolic function  . Hypertension    no meds  . Kidney stones    no current problem  . Migraine   . Nasal turbinate hypertrophy 09/2011   bilat.  . Pelvic inflammatory disease     Patient Active Problem List   Diagnosis Date Noted  . Gallstone 01/07/2016  . History of colonic polyps 11/27/2015  . Right lower quadrant abdominal pain 11/27/2015  . HTN (hypertension), benign 11/24/2015  . Abdominal pain 11/16/2015  . Headache 10/13/2015  . 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 Anxiety disorder 11/03/2014    Past Surgical History:  Procedure Laterality Date  . CHOLECYSTECTOMY N/A 01/15/2016   Procedure: LAPAROSCOPIC CHOLECYSTECTOMY WITH INTRAOPERATIVE CHOLANGIOGRAM;  Surgeon: Christene Lye, MD;  Location: [facility];  Service: General;  Laterality: N/A;  . COLONOSCOPY  2007   [facility]: pedunculated 13mm polyp in distal sigmoid, sessile 22mm polyp at splenic flexure, larger polyp tubulovillous adenoma and smaller polyp tubular adenoma   . COLONOSCOPY  09-09-15  . COLONOSCOPY WITH PROPOFOL N/A 03/01/2016   Procedure: COLONOSCOPY WITH PROPOFOL;  Surgeon: Garlan Fair, MD;  Location: [facility];  Service: Endoscopy;  Laterality: N/A;  . NASAL SEPTOPLASTY W/ TURBINOPLASTY  10/04/2011   Procedure: NASAL SEPTOPLASTY WITH TURBINATE REDUCTION;  Surgeon: Ascencion Dike, MD;  Location: [facility];  Service: ENT;  Laterality: Bilateral;  . WISDOM TOOTH EXTRACTION  2009    OB History    Gravida Para Term Preterm AB Living   4 4 4     4    SAB TAB Ectopic Multiple Live Births           4      Obstetric Comments   1st Menstrual Cycle:  10  1st Pregnancy:  16        Home Medications    Prior to Admission medications   Medication Sig Start Date End Date Taking? Authorizing Provider  albuterol (PROVENTIL HFA;VENTOLIN HFA) 108 (90 Base)  MCG/ACT inhaler Inhale 2 puffs into the lungs every 4 (four) hours as needed for wheezing or shortness of breath. 05/22/16   Noemi Chapel, MD  clonazePAM (KLONOPIN) 0.5 MG tablet Take 1 tablet (0.5 mg total) by mouth daily. Patient taking differently: Take 0.25 mg by mouth 2 (two) times daily.  10/20/15   Robyn Haber, MD  esomeprazole (NEXIUM) 40 MG capsule Take 1 capsule (40 mg total) by mouth 2 (two) times daily before a meal. 04/29/16   Liliane Shi, PA-C  fluticasone (FLONASE) 50 MCG/ACT nasal spray Place 2 sprays into both nostrils daily. Patient taking differently: Place 2 sprays into both nostrils daily as needed for allergies.  12/22/15   Maren Reamer, MD  ibuprofen (ADVIL,MOTRIN) 800 MG tablet Take 1 tablet (800 mg total) by mouth 3 (three) times daily. Patient taking differently: Take 800 mg by mouth 3 (three) times daily as needed for mild pain or moderate pain.  03/05/16   Keitha Butte, CNM  levonorgestrel-ethinyl estradiol (AUBRA) 0.1-20 MG-MCG tablet Take 1 tablet by mouth daily. Reported on 12/04/2015    Historical Provider, MD  loratadine (CLARITIN) 10 MG tablet Take 10 mg by mouth daily. 05/26/16 05/26/17  Historical Provider, MD  montelukast (SINGULAIR) 10 MG tablet TAKE 1 TABLET BY MOUTH DAILY Patient taking differently: TAKE 10mg  TABLET BY MOUTH DAILY 01/18/16   Maren Reamer, MD  Multiple Vitamins-Minerals (MULTIVITAMIN PO) Take 1 tablet by mouth every morning.    Historical Provider, MD  potassium chloride SA (K-DUR,KLOR-CON) 20 MEQ tablet Take 20 mEq by mouth 2 (two) times a week.    Historical Provider, MD    Family History Family History  Problem Relation Age of Onset  . Kidney cancer Mother   . Hypertension Mother   . Migraines Mother   . Multiple sclerosis Mother   . Fibromyalgia Mother   . Liver disease Mother     NASH   . Hypertension Father   . Liver disease Father     liver transplant; had fatty liver disease  . Spina bifida Brother   . Migraines  Maternal Grandmother   . Hypertension Other   . Colon cancer Paternal Grandfather     Social History Social History  Substance Use Topics  . Smoking status: Former Smoker    Packs/day: 0.25    Years: 10.00    Quit date: 08/23/2008  . Smokeless tobacco: Never Used  . Alcohol use No     Allergies   Clarithromycin; Bentyl [dicyclomine hcl]; Haldol [haloperidol]; Meclizine; Morphine and related; Toradol [ketorolac tromethamine]; Macrobid [nitrofurantoin monohyd macro]; and Sulfa antibiotics   Review of Systems Review of Systems  All other systems reviewed and are negative.    Physical Exam Updated Vital Signs BP 131/87 (BP Location: Right Arm)   Pulse 70   Temp 98 F (36.7 C) (Oral)  Resp 18   Ht 5\' 3"  (1.6 m)   Wt 166 lb (75.3 kg)   SpO2 99%   BMI 29.41 kg/m   Physical Exam  Constitutional: She appears well-developed and well-nourished. No distress.  HENT:  Head: Normocephalic and atraumatic.  Mouth/Throat: Oropharynx is clear and moist. No oropharyngeal exudate.  Eyes: Conjunctivae and EOM are normal. Pupils are equal, round, and reactive to light. Right eye exhibits no discharge. Left eye exhibits no discharge. No scleral icterus.  Neck: Normal range of motion. Neck supple. No JVD present. No thyromegaly present.  Cardiovascular: Normal rate, regular rhythm, normal heart sounds and intact distal pulses.  Exam reveals no gallop and no friction rub.   No murmur heard. Pulmonary/Chest: Effort normal and breath sounds normal. No respiratory distress. She has no wheezes. She has no rales. She exhibits tenderness ( There is tenderness to palpation over the bilateral ribs as well as over the sternum, there is no signs of rashes, bruising or crepitance).  Abdominal: Soft. Bowel sounds are normal. She exhibits no distension and no mass. There is no tenderness.  The abdomen is not distended, no tympanitic sounds to percussion, no guarding, no masses, very soft in all quadrants   Musculoskeletal: Normal range of motion. She exhibits tenderness. She exhibits no edema.  There is no edema of the lower extremities but the patient does have tenderness to palpation all over her back, I lateral chest wall, left shoulder and arm  Lymphadenopathy:    She has no cervical adenopathy.  Neurological: She is alert. Coordination normal.  The patient has normal speech, normal gait, moves all 4 extremities without difficulty and cranial nerves III through XII are normal  Skin: Skin is warm and dry. No rash noted. No erythema.  Psychiatric: She has a normal mood and affect. Her behavior is normal.  Nursing note and vitals reviewed.    ED Treatments / Results  Labs (all labs ordered are listed, but only abnormal results are displayed) Labs Reviewed  COMPREHENSIVE METABOLIC PANEL - Abnormal; Notable for the following:       Result Value   Potassium 3.1 (*)    BUN <5 (*)    All other components within normal limits  URINALYSIS, ROUTINE W REFLEX MICROSCOPIC - Abnormal; Notable for the following:    Ketones, ur 5 (*)    All other components within normal limits  LIPASE, BLOOD  CBC  I-STAT BETA HCG BLOOD, ED ([facility], [facility], [facility] ONLY)  I-STAT TROPOININ, ED    EKG  EKG Interpretation  Date/Time:  Monday June 13 2016 16:13:06 EST Ventricular Rate:  94 PR Interval:  130 QRS Duration: 88 QT Interval:  354 QTC Calculation: 442 R Axis:   90 Text Interpretation:  Normal sinus rhythm Rightward axis ST & T wave abnormality, consider inferior ischemia ST & T wave abnormality, consider anterolateral ischemia Abnormal ECG Since last tracing T wave abnormality NOW PRESENT Confirmed by Sabra Heck  MD, Shiri Hodapp ([id number]) on 06/13/2016 5:06:58 PM       Radiology Dg Abd Acute W/chest  Result Date: 06/13/2016 CLINICAL DATA:  Frequent bowel movements today. Lower abdominal pain and bloating. Onset several days ago but worsening today. EXAM: DG ABDOMEN ACUTE W/ 1V CHEST COMPARISON:  06/02/2016  FINDINGS: There is no evidence of dilated bowel loops or free intraperitoneal air. There are calcifications overlying the kidneys bilaterally, consistent with nephrolithiasis as observed on the CT of 02/17/2016. The 2 right hemipelvic calcifications are visible on that prior CT, and reside  outside the urinary tract. No ureteral calculi are evident. Heart size and mediastinal contours are within normal limits. Both lungs are clear. IMPRESSION: Normal abdominal gas pattern. No acute cardiopulmonary disease. Nephrolithiasis, without evidence of ureteral calculus. Electronically Signed   By: Andreas Newport M.D.   On: 06/13/2016 18:32    Procedures Procedures (including critical care time)  Medications Ordered in ED Medications - No data to display   Initial Impression / Assessment and Plan / ED Course  I have reviewed the triage vital signs and the nursing notes.  Pertinent labs & imaging results that were available during my care of the patient were reviewed by me and considered in my medical decision making (see chart for details).  Clinical Course     Etiology of the symptoms is unclear however the patient has no acute findings on exam of any concern, her EKG will need to be repeated, labs and ordered, will not pursue PE testing as the patient has had a negative d-dimer and a negative ultrasound of her legs last week, the abdomen is benign despite the patient's complaints and she has had 4 bowel movements today each of which were normal caliber and had no blood.  Overall the labs are unremarkable, urinalysis is clean, mild hypokalemia that is really of no concern in this patient, troponin unremarkable and the acute abdominal series is unremarkable. She continues to appear well, she is stable for discharge.  Repeat EKG on 06/13/2016 at 8:41 PM shows normal sinus rhythm, normal axis, normal intervals, normal ST segments, normal T waves, since last tracing all of the abnormal ST and T waves have  resolved. Impression normal EKG  Final Clinical Impressions(s) / ED Diagnoses   Final diagnoses:  Chest pain, unspecified type  Abdominal bloating    New Prescriptions New Prescriptions   No medications on file     Noemi Chapel, MD 06/13/16 2050

## 2016-06-13 NOTE — ED Triage Notes (Addendum)
Pt is here with for weeks that she was told is due to bronchitis. Today has bloating, but today has a 4 BMs but is not diarrhea. Is having lower abdominal pain, cp and pain radiating into shoulders. Appears anxious, tearful. Says the bloating is her main problem.

## 2016-06-15 LAB — CERVICOVAGINAL ANCILLARY ONLY
CHLAMYDIA, DNA PROBE: NEGATIVE
Neisseria Gonorrhea: NEGATIVE

## 2016-06-18 ENCOUNTER — Emergency Department (HOSPITAL_BASED_OUTPATIENT_CLINIC_OR_DEPARTMENT_OTHER): Payer: Medicaid Other

## 2016-06-18 ENCOUNTER — Encounter (HOSPITAL_BASED_OUTPATIENT_CLINIC_OR_DEPARTMENT_OTHER): Payer: Self-pay | Admitting: Emergency Medicine

## 2016-06-18 ENCOUNTER — Other Ambulatory Visit: Payer: Self-pay

## 2016-06-18 ENCOUNTER — Emergency Department (HOSPITAL_BASED_OUTPATIENT_CLINIC_OR_DEPARTMENT_OTHER)
Admission: EM | Admit: 2016-06-18 | Discharge: 2016-06-18 | Disposition: A | Discharge status: Home / Self Care | Payer: Medicaid Other | Attending: Emergency Medicine | Admitting: Emergency Medicine

## 2016-06-18 DIAGNOSIS — R630 Anorexia: Secondary | ICD-10-CM | POA: Diagnosis not present

## 2016-06-18 DIAGNOSIS — R000 Tachycardia, unspecified: Secondary | ICD-10-CM | POA: Diagnosis not present

## 2016-06-18 DIAGNOSIS — R079 Chest pain, unspecified: Secondary | ICD-10-CM | POA: Diagnosis not present

## 2016-06-18 DIAGNOSIS — G479 Sleep disorder, unspecified: Secondary | ICD-10-CM | POA: Diagnosis not present

## 2016-06-18 DIAGNOSIS — Z79899 Other long term (current) drug therapy: Secondary | ICD-10-CM | POA: Insufficient documentation

## 2016-06-18 DIAGNOSIS — R109 Unspecified abdominal pain: Secondary | ICD-10-CM | POA: Diagnosis not present

## 2016-06-18 DIAGNOSIS — R0602 Shortness of breath: Secondary | ICD-10-CM | POA: Diagnosis not present

## 2016-06-18 DIAGNOSIS — R072 Precordial pain: Secondary | ICD-10-CM | POA: Diagnosis present

## 2016-06-18 DIAGNOSIS — Z87891 Personal history of nicotine dependence: Secondary | ICD-10-CM | POA: Insufficient documentation

## 2016-06-18 LAB — CBC WITH DIFFERENTIAL/PLATELET
BASOS ABS: 0 10*3/uL (ref 0.0–0.1)
BASOS PCT: 1 %
Eosinophils Absolute: 0.2 10*3/uL (ref 0.0–0.7)
Eosinophils Relative: 4 %
HEMATOCRIT: 40.8 % (ref 36.0–46.0)
Hemoglobin: 13.7 g/dL (ref 12.0–15.0)
Lymphocytes Relative: 22 %
Lymphs Abs: 1.2 10*3/uL (ref 0.7–4.0)
MCH: 30.5 pg (ref 26.0–34.0)
MCHC: 33.6 g/dL (ref 30.0–36.0)
MCV: 90.9 fL (ref 78.0–100.0)
MONO ABS: 0.6 10*3/uL (ref 0.1–1.0)
Monocytes Relative: 11 %
Neutro Abs: 3.5 10*3/uL (ref 1.7–7.7)
Neutrophils Relative %: 62 %
PLATELETS: 290 10*3/uL (ref 150–400)
RBC: 4.49 MIL/uL (ref 3.87–5.11)
RDW: 13 % (ref 11.5–15.5)
WBC: 5.6 10*3/uL (ref 4.0–10.5)

## 2016-06-18 LAB — BASIC METABOLIC PANEL
ANION GAP: 9 (ref 5–15)
BUN: <5 mg/dL — ABNORMAL LOW (ref 6–20)
CALCIUM: 9.4 mg/dL (ref 8.9–10.3)
CO2: 26 mmol/L (ref 22–32)
CREATININE: 0.66 mg/dL (ref 0.44–1.00)
Chloride: 104 mmol/L (ref 101–111)
GLUCOSE: 81 mg/dL (ref 65–99)
Potassium: 3.6 mmol/L (ref 3.5–5.1)
Sodium: 139 mmol/L (ref 135–145)

## 2016-06-18 LAB — URINALYSIS, MICROSCOPIC (REFLEX)

## 2016-06-18 LAB — URINALYSIS, ROUTINE W REFLEX MICROSCOPIC
Bilirubin Urine: NEGATIVE
GLUCOSE, UA: NEGATIVE mg/dL
Hgb urine dipstick: NEGATIVE
Ketones, ur: NEGATIVE mg/dL
Nitrite: NEGATIVE
PH: 6 (ref 5.0–8.0)
PROTEIN: NEGATIVE mg/dL
SPECIFIC GRAVITY, URINE: 1.006 (ref 1.005–1.030)

## 2016-06-18 LAB — TROPONIN I: Troponin I: <0.03 ng/mL (ref <0.03)

## 2016-06-18 LAB — PREGNANCY, URINE: Preg Test, Ur: NEGATIVE

## 2016-06-18 MED ORDER — SODIUM CHLORIDE 0.9 % IV BOLUS (SEPSIS)
1000.0000 mL | Freq: Once | INTRAVENOUS | Status: AC
  Administered 2016-06-18: 1000 mL via INTRAVENOUS

## 2016-06-18 MED ORDER — IOPAMIDOL (ISOVUE-370) INJECTION 76%
100.0000 mL | Freq: Once | INTRAVENOUS | Status: AC | PRN
  Administered 2016-06-18: 100 mL via INTRAVENOUS

## 2016-06-18 MED ORDER — CALCIUM POLYCARBOPHIL 625 MG PO TABS: 625.0000 mg | ORAL_TABLET | Freq: Every day | ORAL | 0 refills | Status: DC

## 2016-06-18 MED ORDER — IBUPROFEN 800 MG PO TABS: 800.0000 mg | ORAL_TABLET | Freq: Four times a day (QID) | ORAL | 0 refills | Status: DC | PRN

## 2016-06-18 NOTE — ED Provider Notes (Signed)
[place] DEPT [facility] Provider Note   CSN: [id number] Arrival date & time: 06/18/16  1159  By signing my name below, I, Christina Fox, attest that this documentation has been prepared under the direction and in the presence of Leo Grosser, MD . Electronically Signed: Jeanell Fox, Scribe. 06/18/2016. 4:36 PM.  History   Chief Complaint Chief Complaint  Patient presents with  . Abdominal Pain   The history is provided by the patient. No language interpreter was used.   HPI Comments: Christina Fox is a 35 y.o. female who presents to the Emergency Department complaining of constant moderate left-sided chest pain that started two days ago. She states the pain radiates to her back and intermittently to her substernal area. She reports no modifying factors. She reports associated symptoms of abdominal bloating, mild abdominal pain, SOB, and sleep disturbance (due to pain). She is on estrogen-containing oral contraceptives. She denies any prior episodes of similar complaint, nausea, or other complaints.     PCP: Darden Amber, PA  Past Medical History:  Diagnosis Date  . Abdominal pain    "right lower quadrant pain and right lower back pain  . Anxiety   . Deviated septum 09/2011  . GERD (gastroesophageal reflux disease)   . Headache(784.0)    sinus  . History of echocardiogram    Echo 11/17: EF 55-60, no RWMA, normal diastolic function  . Hypertension    no meds  . Kidney stones    no current problem  . Migraine   . Nasal turbinate hypertrophy 09/2011   bilat.  . Pelvic inflammatory disease     Patient Active Problem List   Diagnosis Date Noted  . Gallstone 01/07/2016  . History of colonic polyps 11/27/2015  . Right lower quadrant abdominal pain 11/27/2015  . HTN (hypertension), benign 11/24/2015  . Abdominal pain 11/16/2015  . Headache 10/13/2015  . 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 Anxiety disorder 11/03/2014    Past Surgical History:  Procedure Laterality Date    . CHOLECYSTECTOMY N/A 01/15/2016   Procedure: LAPAROSCOPIC CHOLECYSTECTOMY WITH INTRAOPERATIVE CHOLANGIOGRAM;  Surgeon: Christene Lye, MD;  Location: [facility];  Service: General;  Laterality: N/A;  . COLONOSCOPY  2007   [facility]: pedunculated 57mm polyp in distal sigmoid, sessile 64mm polyp at splenic flexure, larger polyp tubulovillous adenoma and smaller polyp tubular adenoma   . COLONOSCOPY  09-09-15  . COLONOSCOPY WITH PROPOFOL N/A 03/01/2016   Procedure: COLONOSCOPY WITH PROPOFOL;  Surgeon: Garlan Fair, MD;  Location: [facility];  Service: Endoscopy;  Laterality: N/A;  . NASAL SEPTOPLASTY W/ TURBINOPLASTY  10/04/2011   Procedure: NASAL SEPTOPLASTY WITH TURBINATE REDUCTION;  Surgeon: Ascencion Dike, MD;  Location: [place];  Service: ENT;  Laterality: Bilateral;  . WISDOM TOOTH EXTRACTION  2009    OB History    Gravida Para Term Preterm AB Living   4 4 4     4    SAB TAB Ectopic Multiple Live Births           4      Obstetric Comments   1st Menstrual Cycle:  10  1st Pregnancy:  16        Home Medications    Prior to Admission medications   Medication Sig Start Date End Date Taking? Authorizing Provider  albuterol (PROVENTIL HFA;VENTOLIN HFA) 108 (90 Base) MCG/ACT inhaler Inhale 2 puffs into the lungs every 4 (four) hours as needed for wheezing or shortness of breath. 05/22/16   Noemi Chapel, MD  clonazePAM (KLONOPIN) 0.5 MG tablet Take 1 tablet (0.5 mg total) by mouth daily. Patient taking differently: Take 0.25 mg by mouth 2 (two) times daily.  10/20/15   Robyn Haber, MD  esomeprazole (NEXIUM) 40 MG capsule Take 1 capsule (40 mg total) by mouth 2 (two) times daily before a meal. 04/29/16   Liliane Shi, PA-C  fluticasone (FLONASE) 50 MCG/ACT nasal spray Place 2 sprays into both nostrils daily. Patient taking differently: Place 2 sprays into both nostrils daily as needed for allergies.  12/22/15   Maren Reamer, MD  ibuprofen (ADVIL,MOTRIN) 800 MG  tablet Take 1 tablet (800 mg total) by mouth 3 (three) times daily. Patient taking differently: Take 800 mg by mouth 3 (three) times daily as needed for mild pain or moderate pain.  03/05/16   Keitha Butte, CNM  levonorgestrel-ethinyl estradiol (AUBRA) 0.1-20 MG-MCG tablet Take 1 tablet by mouth daily. Reported on 12/04/2015    Historical Provider, MD  loratadine (CLARITIN) 10 MG tablet Take 10 mg by mouth daily. 05/26/16 05/26/17  Historical Provider, MD  montelukast (SINGULAIR) 10 MG tablet TAKE 1 TABLET BY MOUTH DAILY Patient taking differently: TAKE 10mg  TABLET BY MOUTH DAILY 01/18/16   Maren Reamer, MD  Multiple Vitamins-Minerals (MULTIVITAMIN PO) Take 1 tablet by mouth every morning.    Historical Provider, MD  potassium chloride SA (K-DUR,KLOR-CON) 20 MEQ tablet Take 20 mEq by mouth 2 (two) times a week.    Historical Provider, MD    Family History Family History  Problem Relation Age of Onset  . Kidney cancer Mother   . Hypertension Mother   . Migraines Mother   . Multiple sclerosis Mother   . Fibromyalgia Mother   . Liver disease Mother     NASH   . Hypertension Father   . Liver disease Father     liver transplant; had fatty liver disease  . Spina bifida Brother   . Migraines Maternal Grandmother   . Hypertension Other   . Colon cancer Paternal Grandfather     Social History Social History  Substance Use Topics  . Smoking status: Former Smoker    Packs/day: 0.25    Years: 10.00    Quit date: 08/23/2008  . Smokeless tobacco: Never Used  . Alcohol use No     Allergies   Clarithromycin; Bentyl [dicyclomine hcl]; Haldol [haloperidol]; Meclizine; Morphine and related; Toradol [ketorolac tromethamine]; Macrobid [nitrofurantoin monohyd macro]; and Sulfa antibiotics   Review of Systems Review of Systems  Respiratory: Positive for shortness of breath.   Cardiovascular: Positive for chest pain.  Gastrointestinal: Positive for abdominal pain.  Psychiatric/Behavioral:  Positive for sleep disturbance.  All other systems reviewed and are negative.    Physical Exam Updated Vital Signs BP 146/99 (BP Location: Right Arm)   Pulse 90   Temp 98 F (36.7 C) (Oral)   Resp 18   SpO2 100%   Physical Exam  Constitutional: She is oriented to person, place, and time. She appears well-developed and well-nourished. No distress.  HENT:  Head: Normocephalic.  Right Ear: Tympanic membrane normal.  Left Ear: Tympanic membrane normal.  Nose: Nose normal.  Eyes: Conjunctivae are normal.  Neck: Neck supple. No tracheal deviation present.  Cardiovascular: Regular rhythm, S1 normal, S2 normal and normal heart sounds.  Tachycardia present.   Pulmonary/Chest: Effort normal. No respiratory distress. She has no wheezes. She has no rales.  Abdominal: Soft. She exhibits no distension. There is no tenderness. There is no rebound and no  guarding.  Musculoskeletal:  No edema.   Neurological: She is alert and oriented to person, place, and time.  Skin: Skin is warm and dry.  Psychiatric: She has a normal mood and affect.  Nursing note and vitals reviewed.    ED Treatments / Results  DIAGNOSTIC STUDIES: Oxygen Saturation is 100% on RA, normal by my interpretation.    COORDINATION OF CARE: 4:40 PM- Pt advised of plan for treatment and pt agrees.  Labs (all labs ordered are listed, but only abnormal results are displayed) Labs Reviewed  URINALYSIS, [place] - Abnormal; Notable for the following:       Result Value   Leukocytes, UA TRACE (*)    All other components within normal limits  URINALYSIS, MICROSCOPIC (REFLEX) - Abnormal; Notable for the following:    Bacteria, UA FEW (*)    Squamous Epithelial / LPF 0-5 (*)    All other components within normal limits  BASIC METABOLIC PANEL - Abnormal; Notable for the following:    BUN <5 (*)    All other components within normal limits  PREGNANCY, URINE  CBC WITH DIFFERENTIAL/PLATELET  TROPONIN I    TROPONIN I    EKG  EKG Interpretation  Date/Time:  Saturday June 18 2016 12:09:15 EST Ventricular Rate:  114 PR Interval:  124 QRS Duration: 84 QT Interval:  298 QTC Calculation: 410 R Axis:   73 Text Interpretation:  Sinus tachycardia Possible Left atrial enlargement ST-t wave abnormality Inferolateral leads New since previous tracing Abnormal ECG Confirmed by Jaidan Stachnik MD, Evianna Chandran [phone]) on 06/18/2016 3:49:04 PM       EKG Interpretation  Date/Time:  Saturday June 18 2016 17:41:25 EST Ventricular Rate:  80 PR Interval:  124 QRS Duration: 73 QT Interval:  369 QTC Calculation: 426 R Axis:   45 Text Interpretation:  Sinus rhythm Probable left atrial enlargement Since last tracing rate slower Nonspecific ST and T wave abnormality RESOLVED SINCE PREVIOUS Confirmed by Cathline Dowen MD, Carylon Tamburro [phone]) on 06/18/2016 5:54:50 PM       Radiology Ct Angio Chest Pe W And/or Wo Contrast  Result Date: 06/18/2016 CLINICAL DATA:  Chest pain, shortness of breath, abnormal EKG tachycardia. Shortness of breath with exertion. EXAM: CT ANGIOGRAPHY CHEST WITH CONTRAST TECHNIQUE: Multidetector CT imaging of the chest was performed using the standard protocol during bolus administration of intravenous contrast. Multiplanar CT image reconstructions and MIPs were obtained to evaluate the vascular anatomy. CONTRAST:  100 cc Isovue 370 COMPARISON:  06/13/2016 chest x-ray FINDINGS: Cardiovascular: Satisfactory opacification of the pulmonary arteries to the segmental level. No evidence of pulmonary embolism. Normal heart size. No pericardial effusion. Mediastinum/Nodes: No enlarged mediastinal, hilar, or axillary lymph nodes. Thyroid gland, trachea, and esophagus demonstrate no significant findings. Lungs/Pleura: Lungs are clear. No focal pneumonia, collapse or consolidation. Negative for edema, interstitial process, effusion, pleural abnormality or pneumothorax. Incidental peripheral 4 mm subpleural pulmonary  nodule in the left upper lobe, image 38. Upper Abdomen: No acute abnormality. Musculoskeletal: No chest wall abnormality. No acute or significant osseous findings. Review of the MIP images confirms the above findings. IMPRESSION: Negative for significant acute pulmonary embolus by CTA. No acute intrathoracic process 4 mm subpleural peripheral left upper lobe pulmonary nodule. No follow-up needed if patient is low-risk. Non-contrast chest CT can be considered in 12 months if patient is high-risk. This recommendation follows the consensus statement: Guidelines for Management of Incidental Pulmonary Nodules Detected on CT Images: From the Fleischner Society 2017; Radiology 2017; 284:228-243. Electronically Signed  By: Eugenie Filler M.D.   On: 06/18/2016 17:25    Procedures Procedures (including critical care time)  Medications Ordered in ED Medications - No data to display   Initial Impression / Assessment and Plan / ED Course  I have reviewed the triage vital signs and the nursing notes.  Pertinent labs & imaging results that were available during my care of the patient were reviewed by me and considered in my medical decision making (see chart for details).  Clinical Course     35 y.o. female presents with left lateral chest pain that has been occurring over the last 2 days as well as GI upset that has been continuous with alternating constipation and loose stools. Concern with mild tachycardia and  OCP intake that she has risk of PE and with cough on multiple visits leading up to this visit CT was performed to r/o PE. No PE was found, Pt will be started on fiber supplementation to help with regularity, IVF for loss of appetite and poor po intake. Plan to follow up with PCP as needed and return precautions discussed for worsening or new concerning symptoms.   Final Clinical Impressions(s) / ED Diagnoses   Final diagnoses:  Loss of appetite  Nonspecific chest pain    New  Prescriptions Discharge Medication List as of 06/18/2016  7:05 PM    START taking these medications   Details  polycarbophil (FIBERCON) 625 MG tablet Take 1 tablet (625 mg total) by mouth daily., Starting Sat 06/18/2016, Print       I personally performed the services described in this documentation, which was scribed in my presence. The recorded information has been reviewed and is accurate.      Leo Grosser, MD 06/19/16 [phone]

## 2016-06-18 NOTE — ED Triage Notes (Addendum)
Pt reports ongoing chest pain and abdominal bloating for several weeks. Pt seen multiple times, tx for low potassium and dehydration. Pt reports normal BM, no vomiting. Pt started taking pepcid after being seen by PCP.

## 2016-06-18 NOTE — ED Notes (Signed)
Has seen MD x 2 this week and ED x1 , and this is 4th visit for abd pain . Pain to bil upper quadrants, with diarrhea on Tuesday, no vomiting and no appetite

## 2016-06-21 ENCOUNTER — Ambulatory Visit (INDEPENDENT_AMBULATORY_CARE_PROVIDER_SITE_OTHER): Payer: Medicaid Other | Admitting: Physician Assistant

## 2016-06-21 ENCOUNTER — Encounter: Payer: Self-pay | Admitting: Physician Assistant

## 2016-06-21 VITALS — BP 120/90 | HR 82 | Ht 63.0 in | Wt 165.1 lb

## 2016-06-21 DIAGNOSIS — K219 Gastro-esophageal reflux disease without esophagitis: Secondary | ICD-10-CM

## 2016-06-21 DIAGNOSIS — F419 Anxiety disorder, unspecified: Secondary | ICD-10-CM

## 2016-06-21 DIAGNOSIS — R072 Precordial pain: Secondary | ICD-10-CM

## 2016-06-21 DIAGNOSIS — R911 Solitary pulmonary nodule: Secondary | ICD-10-CM | POA: Diagnosis not present

## 2016-06-21 NOTE — Progress Notes (Signed)
Cardiology Office Note:    Date:  06/21/2016   ID:  Christina Fox, DOB 02-03-1981, MRN [id number]  PCP:  Darden Amber, PA  Cardiologist:  Dr. Allegra Lai   Electrophysiologist:  n/a GI: Dr. Earle Gell  Referring MD: Darden Amber, [place]   Chief Complaint  Patient presents with  . Chest Pain    History of Present Illness:    Christina Fox is a 35 y.o. female with a hx of anxiety. Her blood pressure has been elevated in the past but she has not been put on medications.  She was evaluated by Dr. Allegra Lai in 6/17 for chest pain.  ETT-Myoview was low risk and demonstrated lateral ST depression but normal nuclear images.  PRN follow up was recommended.  She returned in 11/17 with continued chest pain.  Initially her symptoms improved after a cholecystectomy.  Her pain was reproducible with palpation.  I adjusted her PPI, placed her NSAIDs for several days, obtained a Troponin which was normal and an echocardiogram which was normal.  Again, PRN follow up was recommended.    Since last seen, she has visited the emergency room multiple times with chest discomfort. She actually went to the emergency room later in the day after seeing me in 04/29/16. She was again seen in the ED on 05/19/16, 05/22/16, 05/30/16, 06/03/16, 06/13/16 and 06/18/16 with chest pain and negative workup. Chest CTA on 12/23 demonstrated no evidence of pulmonary embolism.  She is here today with her friend.  She continues to have chest pain that is occurring daily.  She denies exertional chest pain.  She mainly notes epigastric discomfort and L sided chest pain that seems to be worse after meals.  She is on high dose PPI and her PCP recently put her on Pepcid. She has been trying to get into GI but cannot get an appointment.  She denies dysphagia.  She denies hematemesis.  She has noted dyspnea on exertion with more extreme activities. She denies orthopnea, PND, edema.  She denies syncope.    Prior CV studies that  were reviewed today include:    Chest CTA 06/18/16 IMPRESSION: Negative for significant acute pulmonary embolus by CTA. No acute intrathoracic process 4 mm subpleural peripheral left upper lobe pulmonary nodule. No follow-up needed if patient is low-risk. Non-contrast chest CT can be considered in 12 months if patient is high-risk. This recommendation follows the consensus statement: Guidelines for Management of Incidental Pulmonary Nodules Detected on CT Images: From the Fleischner Society 2017; Radiology 2017; 284:228-243.  Echo 05/17/16 EF 55-60, no RWMA, normal diastolic function  ETT-Myoview 6/17 Ex 7'; horizontal ST depression in V4-6; EF 59%; normal perfusion; Low Risk  Past Medical History:  Diagnosis Date  . Abdominal pain    "right lower quadrant pain and right lower back pain  . Anxiety   . Deviated septum 09/2011  . GERD (gastroesophageal reflux disease)   . Headache(784.0)    sinus  . History of echocardiogram    Echo 11/17: EF 55-60, no RWMA, normal diastolic function  . Hypertension    no meds  . Kidney stones    no current problem  . Migraine   . Nasal turbinate hypertrophy 09/2011   bilat.  . Pelvic inflammatory disease     Past Surgical History:  Procedure Laterality Date  . CHOLECYSTECTOMY N/A 01/15/2016   Procedure: LAPAROSCOPIC CHOLECYSTECTOMY WITH INTRAOPERATIVE CHOLANGIOGRAM;  Surgeon: Christene Lye, MD;  Location: [facility];  Service: General;  Laterality: N/A;  .  COLONOSCOPY  2007   [facility]: pedunculated 34mm polyp in distal sigmoid, sessile 22mm polyp at splenic flexure, larger polyp tubulovillous adenoma and smaller polyp tubular adenoma   . COLONOSCOPY  09-09-15  . COLONOSCOPY WITH PROPOFOL N/A 03/01/2016   Procedure: COLONOSCOPY WITH PROPOFOL;  Surgeon: Garlan Fair, MD;  Location: [facility];  Service: Endoscopy;  Laterality: N/A;  . NASAL SEPTOPLASTY W/ TURBINOPLASTY  10/04/2011   Procedure: NASAL SEPTOPLASTY WITH TURBINATE  REDUCTION;  Surgeon: Ascencion Dike, MD;  Location: [place];  Service: ENT;  Laterality: Bilateral;  . WISDOM TOOTH EXTRACTION  2009    Current Medications: Current Meds  Medication Sig  . albuterol (PROVENTIL HFA;VENTOLIN HFA) 108 (90 Base) MCG/ACT inhaler Inhale 2 puffs into the lungs every 4 (four) hours as needed for wheezing or shortness of breath.  . clonazePAM (KLONOPIN) 0.5 MG tablet Take 0.5 mg by mouth 2 (two) times daily.  Marland Kitchen doxycycline (VIBRAMYCIN) 100 MG capsule Take 100 mg by mouth 2 (two) times daily.  Marland Kitchen esomeprazole (NEXIUM) 40 MG capsule Take 1 capsule (40 mg total) by mouth 2 (two) times daily before a meal.  . fluticasone (FLONASE) 50 MCG/ACT nasal spray Place 2 sprays into both nostrils daily as needed for allergies or rhinitis.  Marland Kitchen ibuprofen (ADVIL,MOTRIN) 800 MG tablet Take 1 tablet (800 mg total) by mouth every 6 (six) hours as needed for moderate pain or cramping.  Marland Kitchen levonorgestrel-ethinyl estradiol (AUBRA) 0.1-20 MG-MCG tablet Take 1 tablet by mouth daily. Reported on 12/04/2015  . loratadine (CLARITIN) 10 MG tablet Take 10 mg by mouth daily.  . montelukast (SINGULAIR) 10 MG tablet Take 10 mg by mouth at bedtime.  . Multiple Vitamins-Minerals (MULTIVITAMIN PO) Take 1 tablet by mouth every morning.  . potassium chloride SA (K-DUR,KLOR-CON) 20 MEQ tablet Take 20 mEq by mouth 2 (two) times a week.     Allergies:   Clarithromycin; Bentyl [dicyclomine hcl]; Haldol [haloperidol]; Meclizine; Morphine and related; Toradol [ketorolac tromethamine]; Macrobid [nitrofurantoin monohyd macro]; and Sulfa antibiotics   Social History   Social History  . Marital status: Single    Spouse name: N/A  . Number of children: 4  . Years of education: 35   Occupational History  . 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 Smoking status: Former Smoker    Packs/day: 0.25    Years: 10.00    Quit date: 08/23/2008  . Smokeless tobacco: Never Used  .  Alcohol use No  . Drug use: No  . Sexual activity: Not Asked   Other Topics Concern  . None   Social History Narrative   Lives at home w/ her children   Right-handed   Drinks occasional sweet tea     Family History:  The patient's family history includes Colon cancer in her paternal grandfather; Fibromyalgia in her mother; Hypertension in her father, mother, and other; Kidney cancer in her mother; Liver disease in her father and mother; Migraines in her maternal grandmother and mother; Multiple sclerosis in her mother; Spina bifida in her brother.   ROS:   Please see the history of present illness.    Review of Systems  Constitution: Positive for decreased appetite and weight loss.  Cardiovascular: Positive for chest pain.  Musculoskeletal: Positive for back pain.  Gastrointestinal: Positive for abdominal pain and diarrhea.  Genitourinary: Positive for hematuria.   All other systems reviewed and are negative.   EKGs/Labs/Other Test Reviewed:    EKG:  EKG is  ordered today.  The ekg ordered today demonstrates NSR, HR 82, normal axis, NSSTTW changes, QTc 415 ms  Recent Labs: 10/27/2015: TSH 1.38 12/04/2015: Magnesium 2.1 06/13/2016: ALT 25 06/18/2016: BUN <5; Creatinine, Ser 0.66; Hemoglobin 13.7; Platelets 290; Potassium 3.6; Sodium 139   Recent Lipid Panel    Component Value Date/Time   CHOL 153 12/22/2015 1646   TRIG 99 12/22/2015 1646   HDL 59 12/22/2015 1646   CHOLHDL 2.6 12/22/2015 1646   VLDL 20 12/22/2015 1646   LDLCALC 74 12/22/2015 1646     Physical Exam:    VS:  BP 120/90   Pulse 82   Ht 5\' 3"  (1.6 m)   Wt 165 lb 1.9 oz (74.9 kg)   BMI 29.25 kg/m     Wt Readings from Last 3 Encounters:  06/21/16 165 lb 1.9 oz (74.9 kg)  06/13/16 166 lb (75.3 kg)  06/05/16 166 lb (75.3 kg)     Physical Exam  Constitutional: She is oriented to person, place, and time. She appears well-developed and well-nourished. No distress.  HENT:  Head: Normocephalic and  atraumatic.  Eyes: No scleral icterus.  Neck: No JVD present.  Cardiovascular: Normal rate, regular rhythm and normal heart sounds.   No murmur heard. Pulmonary/Chest: Effort normal. She has no wheezes. She has no rales.  Abdominal: There is tenderness in the epigastric area.  Musculoskeletal: She exhibits no edema.  Neurological: She is alert and oriented to person, place, and time.  Skin: Skin is warm and dry.  Psychiatric: She has a normal mood and affect.    ASSESSMENT:    1. Precordial pain   2. Gastroesophageal reflux disease without esophagitis   3. Anxiety disorder, unspecified type   4. Pulmonary nodule    PLAN:    In order of problems listed above:  1. Chest pain - She has a long hx of chest pain. Nuclear stress test in 6/17 was low risk.  Echo in 11/17 was normal.  Her symptoms have been reproducible with palpation. She has visited the ED many times over the past 2 mos with a negative workup.  Her chest CT 12/23 demonstrated no pulmonary embolism.  I believe her symptoms are all related to GERD.  We discussed +/- proceeding with Coronary CTA.  She has follow up with GI pending.  Ultimately, we decided to hold off on arranging this for now.  She will call back if her GI workup is normal or if she changes her mind.  If we proceed with a Coronary CTA, she will need a dose of Metoprolol Tartrate 25 mg prior to the study to make sure her HR is in the 60s so that we get a good study.  2. GERD - Continue PPI, H2RA.  She plans to follow up with GI soon.  Overall, she feels like her symptoms are GI in nature as well.   3. Anxiety - She does admit to stress at times.  Anxiety can certainly contribute to her symptoms as well.  Continue follow up with primary care.  4. Pulmonary nodule - Noted on recent CT in the ED (4 mm).  FU on this should be addressed by primary care.    Medication Adjustments/Labs and Tests Ordered: Current medicines are reviewed at length with the patient  today.  Concerns regarding medicines are outlined above.  Medication changes, Labs and Tests ordered today are outlined in the Patient Instructions noted below. Patient Instructions  Medication Instructions:  Your physician  recommends that you continue on your current medications as directed. Please refer to the Current Medication list given to you today.  Labwork: NONE  Testing/Procedures: NONE  Follow-Up: AS NEEDED AT THIS TIME  Any Other Special Instructions Will Be Listed Below (If Applicable).  If you need a refill on your cardiac medications before your next appointment, please call your pharmacy.  Signed, Richardson Dopp, PA-C  06/21/2016 3:18 PM    [facility] [place], [place], [place]  [address] Phone: [phone]; Fax: [phone]

## 2016-06-21 NOTE — Patient Instructions (Addendum)
Medication Instructions:  Your physician recommends that you continue on your current medications as directed. Please refer to the Current Medication list given to you today.  Labwork: NONE  Testing/Procedures: NONE  Follow-Up: AS NEEDED AT THIS TIME  Any Other Special Instructions Will Be Listed Below (If Applicable).  If you need a refill on your cardiac medications before your next appointment, please call your pharmacy.

## 2016-07-04 ENCOUNTER — Ambulatory Visit (INDEPENDENT_AMBULATORY_CARE_PROVIDER_SITE_OTHER): Payer: Self-pay | Admitting: Otolaryngology

## 2016-07-08 ENCOUNTER — Other Ambulatory Visit: Payer: Self-pay | Admitting: Physician Assistant

## 2016-07-08 DIAGNOSIS — R079 Chest pain, unspecified: Secondary | ICD-10-CM

## 2016-07-08 DIAGNOSIS — K219 Gastro-esophageal reflux disease without esophagitis: Secondary | ICD-10-CM

## 2016-07-08 DIAGNOSIS — R1010 Upper abdominal pain, unspecified: Secondary | ICD-10-CM

## 2016-07-08 DIAGNOSIS — R1011 Right upper quadrant pain: Secondary | ICD-10-CM

## 2016-07-15 ENCOUNTER — Other Ambulatory Visit: Payer: Self-pay

## 2016-07-15 ENCOUNTER — Other Ambulatory Visit: Payer: Self-pay | Admitting: Physician Assistant

## 2016-07-15 ENCOUNTER — Ambulatory Visit
Admission: RE | Admit: 2016-07-15 | Discharge: 2016-07-15 | Disposition: A | Discharge status: Home / Self Care | Payer: Medicaid Other | Source: Ambulatory Visit | Attending: Physician Assistant | Admitting: Physician Assistant

## 2016-07-15 DIAGNOSIS — R079 Chest pain, unspecified: Secondary | ICD-10-CM

## 2016-07-15 DIAGNOSIS — R1011 Right upper quadrant pain: Secondary | ICD-10-CM

## 2016-07-15 DIAGNOSIS — K219 Gastro-esophageal reflux disease without esophagitis: Secondary | ICD-10-CM

## 2016-07-15 DIAGNOSIS — R1010 Upper abdominal pain, unspecified: Secondary | ICD-10-CM

## 2016-08-29 ENCOUNTER — Ambulatory Visit: Payer: Medicaid Other | Admitting: Allergy and Immunology

## 2016-08-31 ENCOUNTER — Encounter: Payer: Self-pay | Admitting: Obstetrics

## 2016-08-31 ENCOUNTER — Emergency Department (HOSPITAL_BASED_OUTPATIENT_CLINIC_OR_DEPARTMENT_OTHER): Payer: Medicaid Other

## 2016-08-31 ENCOUNTER — Other Ambulatory Visit (HOSPITAL_COMMUNITY)
Admission: RE | Admit: 2016-08-31 | Discharge: 2016-08-31 | Disposition: A | Discharge status: Home / Self Care | Payer: Medicaid Other | Source: Ambulatory Visit | Attending: Obstetrics | Admitting: Obstetrics

## 2016-08-31 ENCOUNTER — Emergency Department (HOSPITAL_BASED_OUTPATIENT_CLINIC_OR_DEPARTMENT_OTHER)
Admission: EM | Admit: 2016-08-31 | Discharge: 2016-08-31 | Disposition: A | Discharge status: Home / Self Care | Payer: Medicaid Other | Attending: Physician Assistant | Admitting: Physician Assistant

## 2016-08-31 ENCOUNTER — Ambulatory Visit (INDEPENDENT_AMBULATORY_CARE_PROVIDER_SITE_OTHER): Payer: Medicaid Other | Admitting: Obstetrics

## 2016-08-31 ENCOUNTER — Encounter (HOSPITAL_BASED_OUTPATIENT_CLINIC_OR_DEPARTMENT_OTHER): Payer: Self-pay | Admitting: Emergency Medicine

## 2016-08-31 VITALS — BP 141/82 | HR 93 | Ht 63.0 in | Wt 164.0 lb

## 2016-08-31 DIAGNOSIS — N898 Other specified noninflammatory disorders of vagina: Secondary | ICD-10-CM

## 2016-08-31 DIAGNOSIS — Z0000 Encounter for general adult medical examination without abnormal findings: Secondary | ICD-10-CM

## 2016-08-31 DIAGNOSIS — R0789 Other chest pain: Secondary | ICD-10-CM | POA: Diagnosis not present

## 2016-08-31 DIAGNOSIS — Z87891 Personal history of nicotine dependence: Secondary | ICD-10-CM | POA: Insufficient documentation

## 2016-08-31 DIAGNOSIS — R072 Precordial pain: Secondary | ICD-10-CM | POA: Diagnosis present

## 2016-08-31 DIAGNOSIS — Z3202 Encounter for pregnancy test, result negative: Secondary | ICD-10-CM | POA: Diagnosis not present

## 2016-08-31 DIAGNOSIS — I10 Essential (primary) hypertension: Secondary | ICD-10-CM | POA: Diagnosis not present

## 2016-08-31 DIAGNOSIS — Z3041 Encounter for surveillance of contraceptive pills: Secondary | ICD-10-CM | POA: Diagnosis not present

## 2016-08-31 LAB — COMPREHENSIVE METABOLIC PANEL
ALK PHOS: 49 U/L (ref 38–126)
ALT: 23 U/L (ref 14–54)
ANION GAP: 5 (ref 5–15)
AST: 21 U/L (ref 15–41)
Albumin: 3.9 g/dL (ref 3.5–5.0)
BILIRUBIN TOTAL: 0.5 mg/dL (ref 0.3–1.2)
BUN: 7 mg/dL (ref 6–20)
CALCIUM: 9.1 mg/dL (ref 8.9–10.3)
CO2: 26 mmol/L (ref 22–32)
CREATININE: 0.74 mg/dL (ref 0.44–1.00)
Chloride: 104 mmol/L (ref 101–111)
Glucose, Bld: 99 mg/dL (ref 65–99)
Potassium: 3.2 mmol/L — ABNORMAL LOW (ref 3.5–5.1)
SODIUM: 135 mmol/L (ref 135–145)
TOTAL PROTEIN: 7.6 g/dL (ref 6.5–8.1)

## 2016-08-31 LAB — CBC WITH DIFFERENTIAL/PLATELET
Basophils Absolute: 0 10*3/uL (ref 0.0–0.1)
Basophils Relative: 0 %
Eosinophils Absolute: 0.3 10*3/uL (ref 0.0–0.7)
Eosinophils Relative: 4 %
HEMATOCRIT: 40.6 % (ref 36.0–46.0)
HEMOGLOBIN: 13.6 g/dL (ref 12.0–15.0)
LYMPHS ABS: 1.9 10*3/uL (ref 0.7–4.0)
LYMPHS PCT: 25 %
MCH: 30.2 pg (ref 26.0–34.0)
MCHC: 33.5 g/dL (ref 30.0–36.0)
MCV: 90 fL (ref 78.0–100.0)
MONOS PCT: 10 %
Monocytes Absolute: 0.7 10*3/uL (ref 0.1–1.0)
NEUTROS PCT: 61 %
Neutro Abs: 4.5 10*3/uL (ref 1.7–7.7)
Platelets: 351 10*3/uL (ref 150–400)
RBC: 4.51 MIL/uL (ref 3.87–5.11)
RDW: 12.2 % (ref 11.5–15.5)
WBC: 7.4 10*3/uL (ref 4.0–10.5)

## 2016-08-31 LAB — URINALYSIS, ROUTINE W REFLEX MICROSCOPIC
BILIRUBIN URINE: NEGATIVE
Glucose, UA: NEGATIVE mg/dL
Hgb urine dipstick: NEGATIVE
KETONES UR: NEGATIVE mg/dL
Leukocytes, UA: NEGATIVE
Nitrite: NEGATIVE
PH: 6.5 (ref 5.0–8.0)
Protein, ur: NEGATIVE mg/dL
SPECIFIC GRAVITY, URINE: 1.004 — AB (ref 1.005–1.030)

## 2016-08-31 LAB — TROPONIN I: Troponin I: <0.03 ng/mL (ref <0.03)

## 2016-08-31 LAB — PREGNANCY, URINE: PREG TEST UR: NEGATIVE

## 2016-08-31 LAB — POCT URINE PREGNANCY: Preg Test, Ur: NEGATIVE

## 2016-08-31 MED ORDER — LEVONORGESTREL-ETHINYL ESTRAD 0.1-20 MG-MCG PO TABS: 1.0000 | ORAL_TABLET | Freq: Every day | ORAL | 11 refills | Status: DC

## 2016-08-31 MED ORDER — ACETAMINOPHEN 325 MG PO TABS
650.0000 mg | ORAL_TABLET | Freq: Once | ORAL | Status: AC
Start: 2016-08-31 — End: 2016-08-31
  Administered 2016-08-31: 650 mg via ORAL
  Filled 2016-08-31: qty 2

## 2016-08-31 MED ORDER — ACETAMINOPHEN 500 MG PO TABS: 500.0000 mg | ORAL_TABLET | Freq: Four times a day (QID) | ORAL | 0 refills | Status: DC | PRN

## 2016-08-31 NOTE — ED Provider Notes (Signed)
[facility] DEPT [facility] Provider Note   CSN: [id number] Arrival date & time: 08/31/16  2119  By signing my name below, I, Hansel Feinstein, attest that this documentation has been prepared under the direction and in the presence of Waynetta Pean, PA-C. Electronically Signed: Hansel Feinstein, ED Scribe. 08/31/16. 10:31 PM.    History   Chief Complaint Chief Complaint  Patient presents with  . Chest Pain    HPI Christina Fox is a 36 y.o. female with h/o GERD, cholecystectomy who presents to the Emergency Department complaining of moderate, constant, non-radiating substernal CP that began 8 hours ago. She denies recent trauma or injury to the chest, recent heavy lifting. Pt describes her pain as "pressure" and reports it initially began on the left before moving to her central chest. Pt states she has taken Tums, Pepcid and Prozac with no relief. She states her pain is worse with movement. No h/o MI, heart disease. Pt is well known to the ED for frequent visits with similar complaints. Pt was dx with costochondritis 3 months ago by her PCP. Per medical records Pt had unremarkable CT angiogram PE study in 05/2016. No evidence of PE.  No increased belching. She denies SOB, abdominal pain, dysuria, hematuria, rashes, leg pain or swelling, numbness, weakness, tingling, fever, difficulty swallowing, visual disturbance, neck pain, vomiting, diarrhea, dizziness, lightheadedness, cough, recent illness.   The history is provided by the patient. No language interpreter was used.    Past Medical History:  Diagnosis Date  . Abdominal pain    "right lower quadrant pain and right lower back pain  . Anxiety   . Deviated septum 09/2011  . GERD (gastroesophageal reflux disease)   . Headache(784.0)    sinus  . History of echocardiogram    Echo 11/17: EF 55-60, no RWMA, normal diastolic function  . Hypertension    no meds  . Kidney stones    no current problem  . Migraine   . Nasal turbinate hypertrophy  09/2011   bilat.  . Pelvic inflammatory disease     Patient Active Problem List   Diagnosis Date Noted  . Gallstone 01/07/2016  . History of colonic polyps 11/27/2015  . Right lower quadrant abdominal pain 11/27/2015  . HTN (hypertension), benign 11/24/2015  . Abdominal pain 11/16/2015  . Headache 10/13/2015  . 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 Anxiety disorder 11/03/2014    Past Surgical History:  Procedure Laterality Date  . CHOLECYSTECTOMY N/A 01/15/2016   Procedure: LAPAROSCOPIC CHOLECYSTECTOMY WITH INTRAOPERATIVE CHOLANGIOGRAM;  Surgeon: Christene Lye, MD;  Location: [facility];  Service: General;  Laterality: N/A;  . COLONOSCOPY  2007   [facility]: pedunculated 42mm polyp in distal sigmoid, sessile 83mm polyp at splenic flexure, larger polyp tubulovillous adenoma and smaller polyp tubular adenoma   . COLONOSCOPY  09-09-15  . COLONOSCOPY WITH PROPOFOL N/A 03/01/2016   Procedure: COLONOSCOPY WITH PROPOFOL;  Surgeon: Garlan Fair, MD;  Location: [facility];  Service: Endoscopy;  Laterality: N/A;  . NASAL SEPTOPLASTY W/ TURBINOPLASTY  10/04/2011   Procedure: NASAL SEPTOPLASTY WITH TURBINATE REDUCTION;  Surgeon: Ascencion Dike, MD;  Location: [facility];  Service: ENT;  Laterality: Bilateral;  . WISDOM TOOTH EXTRACTION  2009    OB History    Gravida Para Term Preterm AB Living   4 4 4     4    SAB TAB Ectopic Multiple Live Births           4      Obstetric  Comments   1st Menstrual Cycle:  10  1st Pregnancy:  16        Home Medications    Prior to Admission medications   Medication Sig Start Date End Date Taking? Authorizing Provider  acetaminophen (TYLENOL) 500 MG tablet Take 1 tablet (500 mg total) by mouth every 6 (six) hours as needed. 08/31/16   Waynetta Pean, PA-C  albuterol (PROVENTIL HFA;VENTOLIN HFA) 108 (90 Base) MCG/ACT inhaler Inhale 2 puffs into the lungs every 4 (four) hours as needed for wheezing or shortness of breath. Patient not taking: Reported  on 08/31/2016 05/22/16   Noemi Chapel, MD  clonazePAM (KLONOPIN) 0.5 MG tablet Take 0.5 mg by mouth 2 (two) times daily.    Historical Provider, MD  doxycycline (VIBRAMYCIN) 100 MG capsule Take 100 mg by mouth 2 (two) times daily. 06/20/16   Historical Provider, MD  esomeprazole (NEXIUM) 40 MG capsule Take 1 capsule (40 mg total) by mouth 2 (two) times daily before a meal. 04/29/16   Liliane Shi, PA-C  famotidine (PEPCID) 20 MG tablet TAKE ONE TABLET BY MOUTH ONCE DAILY 08/16/16   Historical Provider, MD  FLUoxetine (PROZAC) 10 MG capsule Take by mouth. 08/25/16   Historical Provider, MD  fluticasone (FLONASE) 50 MCG/ACT nasal spray Place 2 sprays into both nostrils daily as needed for allergies or rhinitis.    Historical Provider, MD  ibuprofen (ADVIL,MOTRIN) 800 MG tablet Take 1 tablet (800 mg total) by mouth every 6 (six) hours as needed for moderate pain or cramping. 06/18/16   Leo Grosser, MD  levonorgestrel-ethinyl estradiol Thompson Caul) 0.1-20 MG-MCG tablet Take 1 tablet by mouth daily. Reported on 12/04/2015    Historical Provider, MD  levonorgestrel-ethinyl estradiol (AUBRA) 0.1-20 MG-MCG tablet Take 1 tablet by mouth daily. 08/31/16   Shelly Bombard, MD  loratadine (CLARITIN) 10 MG tablet Take 10 mg by mouth daily. 05/26/16 05/26/17  Historical Provider, MD  montelukast (SINGULAIR) 10 MG tablet Take 10 mg by mouth at bedtime.    Historical Provider, MD  Multiple Vitamins-Minerals (MULTIVITAMIN PO) Take 1 tablet by mouth every morning.    Historical Provider, MD  potassium chloride SA (K-DUR,KLOR-CON) 20 MEQ tablet Take 20 mEq by mouth 2 (two) times a week.    Historical Provider, MD    Family History Family History  Problem Relation Age of Onset  . Kidney cancer Mother   . Hypertension Mother   . Migraines Mother   . Multiple sclerosis Mother   . Fibromyalgia Mother   . Liver disease Mother     NASH   . Hypertension Father   . Liver disease Father     liver transplant; had fatty liver  disease  . Spina bifida Brother   . Migraines Maternal Grandmother   . Hypertension Other   . Colon cancer Paternal Grandfather     Social History Social History  Substance Use Topics  . Smoking status: Former Smoker    Packs/day: 0.25    Years: 10.00    Quit date: 08/23/2008  . Smokeless tobacco: Never Used  . Alcohol use No     Allergies   Clarithromycin; Bentyl [dicyclomine hcl]; Haldol [haloperidol]; Meclizine; Morphine and related; Toradol [ketorolac tromethamine]; Macrobid [nitrofurantoin monohyd macro]; and Sulfa antibiotics   Review of Systems Review of Systems  Constitutional: Negative for fever.  HENT: Negative for trouble swallowing.   Eyes: Negative for visual disturbance.  Respiratory: Negative for cough and shortness of breath.   Cardiovascular: Positive for chest pain.  Gastrointestinal:  Negative for abdominal pain, nausea and vomiting.  Genitourinary: Negative for dysuria and hematuria.  Musculoskeletal: Negative for neck pain.  Skin: Negative for rash.  Neurological: Negative for dizziness, weakness, light-headedness and numbness.     Physical Exam Updated Vital Signs BP 152/85 (BP Location: Left Arm)   Pulse 80   Temp 98.4 F (36.9 C) (Oral)   Resp 18   Ht 5\' 3"  (1.6 m)   Wt 74.4 kg   SpO2 100%   BMI 29.05 kg/m   Physical Exam  Constitutional: She is oriented to person, place, and time. She appears well-developed and well-nourished. No distress.  Pt is non-toxic in appearance.   HENT:  Head: Normocephalic and atraumatic.  Right Ear: External ear normal.  Left Ear: External ear normal.  Eyes: Conjunctivae are normal. Pupils are equal, round, and reactive to light. Right eye exhibits no discharge. Left eye exhibits no discharge.  Neck: Normal range of motion. Neck supple. No JVD present. No tracheal deviation present.  Cardiovascular: Normal rate, regular rhythm, normal heart sounds and intact distal pulses.  Exam reveals no gallop and no  friction rub.   No murmur heard. Bilateral radial, posterior tibialis and dorsalis pedis pulses are intact.    Pulmonary/Chest: Effort normal and breath sounds normal. No stridor. No respiratory distress. She has no wheezes. She has no rales. She exhibits tenderness.  Anterior chest wall pain reproducible with palpation.   Abdominal: Soft. She exhibits no distension. There is no tenderness.  Musculoskeletal: Normal range of motion. She exhibits no edema or tenderness.  No lower extremity edema or tenderness.  Lymphadenopathy:    She has no cervical adenopathy.  Neurological: She is alert and oriented to person, place, and time. No sensory deficit. Coordination normal.  Skin: Skin is warm and dry. Capillary refill takes less than 2 seconds. No rash noted. She is not diaphoretic. No erythema. No pallor.  Psychiatric: She has a normal mood and affect. Her behavior is normal.  Nursing note and vitals reviewed.    ED Treatments / Results   DIAGNOSTIC STUDIES: Oxygen Saturation is 98% on RA, normal by my interpretation.    COORDINATION OF CARE: 10:21 PM Discussed treatment plan with pt at bedside which includes labs, CXR and pt agreed to plan.    Labs (all labs ordered are listed, but only abnormal results are displayed) Labs Reviewed  COMPREHENSIVE METABOLIC PANEL - Abnormal; Notable for the following:       Result Value   Potassium 3.2 (*)    All other components within normal limits  URINALYSIS, ROUTINE W REFLEX MICROSCOPIC - Abnormal; Notable for the following:    Specific Gravity, Urine 1.004 (*)    All other components within normal limits  CBC WITH DIFFERENTIAL/PLATELET  TROPONIN I  PREGNANCY, URINE    EKG  EKG Interpretation None       Radiology Dg Chest 2 View  Result Date: 08/31/2016 CLINICAL DATA:  36 y/o  F; chest pain. EXAM: CHEST  2 VIEW COMPARISON:  06/13/2016 chest radiograph. FINDINGS: Stable heart size and mediastinal contours are within normal limits.  Both lungs are clear. The visualized skeletal structures are unremarkable. Right upper quadrant cholecystectomy clips. IMPRESSION: No active cardiopulmonary disease. Electronically Signed   By: Kristine Garbe M.D.   On: 08/31/2016 22:07    Procedures Procedures (including critical care time)  Medications Ordered in ED Medications  acetaminophen (TYLENOL) tablet 650 mg (650 mg Oral Given 08/31/16 2239)     Initial Impression /  Assessment and Plan / ED Course  I have reviewed the triage vital signs and the nursing notes.  Pertinent labs & imaging results that were available during my care of the patient were reviewed by me and considered in my medical decision making (see chart for details).     Patient presented with chest pain to the ED. on exam the patient is afebrile nontoxic appearing. Her chest pain is reproducible on palpation which is reassuring in the setting of unremarkable EKG and normal troponin. Patient is to be discharged with recommendation to follow up with PCP in regards to today's hospital visit. Chest pain is not likely of cardiac or pulmonary etiology due to presentation, perc negative, VSS, no tracheal deviation, no JVD or new murmur, RRR, breath sounds equal bilaterally, EKG without acute abnormalities, negative troponin, and negative CXR. Patient has been advised to return to the ED if chest pain becomes exertional, associated with diaphoresis or nausea, radiates to left jaw/arm, worsens or becomes concerning in any way. Patient had recent CT angiogram of her chest to rule out PE. No evidence of PE on this study. I see no need for additional emergent workup at this time. Pt advised to take Tylenol for pain management. Patient appears reliable for follow up and is agreeable to discharge. I advised the patient to follow-up with their primary care provider this week. I advised the patient to return to the emergency department with new or worsening symptoms or new  concerns. The patient verbalized understanding and agreement with plan.      Final Clinical Impressions(s) / ED Diagnoses   Final diagnoses:  Precordial pain  Chest wall pain    New Prescriptions Discharge Medication List as of 08/31/2016 10:31 PM    START taking these medications   Details  acetaminophen (TYLENOL) 500 MG tablet Take 1 tablet (500 mg total) by mouth every 6 (six) hours as needed., Starting Wed 08/31/2016, Print        I personally performed the services described in this documentation, which was scribed in my presence. The recorded information has been reviewed and is accurate.      Waynetta Pean, PA-C 08/31/16 [place], MD 09/09/16 2115

## 2016-08-31 NOTE — Progress Notes (Signed)
Pt c/o vaginal discharge no itching no odor. R side low abdominal pain x 3 wks. Pharmacy told pt no BCP rf on file; needs rf.

## 2016-08-31 NOTE — ED Notes (Signed)
Pt and gentlemen making jokes in triage, NAD noted

## 2016-09-01 ENCOUNTER — Encounter: Payer: Self-pay | Admitting: Obstetrics

## 2016-09-01 LAB — COMPREHENSIVE METABOLIC PANEL
ALT: 18 IU/L (ref 0–32)
AST: 16 IU/L (ref 0–40)
Albumin/Globulin Ratio: 1.6 (ref 1.2–2.2)
Albumin: 4.2 g/dL (ref 3.5–5.5)
Alkaline Phosphatase: 49 IU/L (ref 39–117)
BILIRUBIN TOTAL: 0.2 mg/dL (ref 0.0–1.2)
BUN/Creatinine Ratio: 10 (ref 9–23)
BUN: 7 mg/dL (ref 6–20)
CHLORIDE: 99 mmol/L (ref 96–106)
CO2: 24 mmol/L (ref 18–29)
CREATININE: 0.71 mg/dL (ref 0.57–1.00)
Calcium: 9.3 mg/dL (ref 8.7–10.2)
GFR calc Af Amer: 127 mL/min/{1.73_m2} (ref >59)
GFR calc non Af Amer: 110 mL/min/{1.73_m2} (ref >59)
GLUCOSE: 101 mg/dL — AB (ref 65–99)
Globulin, Total: 2.7 g/dL (ref 1.5–4.5)
Potassium: 3.9 mmol/L (ref 3.5–5.2)
Sodium: 140 mmol/L (ref 134–144)
Total Protein: 6.9 g/dL (ref 6.0–8.5)

## 2016-09-01 LAB — CERVICOVAGINAL ANCILLARY ONLY
BACTERIAL VAGINITIS: NEGATIVE
CANDIDA VAGINITIS: POSITIVE — AB
Chlamydia: NEGATIVE
Neisseria Gonorrhea: NEGATIVE
TRICH (WINDOWPATH): NEGATIVE

## 2016-09-01 LAB — TSH: TSH: 2.11 u[IU]/mL (ref 0.450–4.500)

## 2016-09-01 NOTE — Progress Notes (Signed)
Patient ID: Christina Fox, female   DOB: 12-Nov-1980, 36 y.o.   MRN: [id number]  Chief Complaint  Patient presents with  . Vaginal Discharge    HPI Christina Fox is a 36 y.o. female.  Vaginal discharge without odor or irritation HPI  Past Medical History:  Diagnosis Date  . Abdominal pain    "right lower quadrant pain and right lower back pain  . Anxiety   . Deviated septum 09/2011  . GERD (gastroesophageal reflux disease)   . Headache(784.0)    sinus  . History of echocardiogram    Echo 11/17: EF 55-60, no RWMA, normal diastolic function  . Hypertension    no meds  . Kidney stones    no current problem  . Migraine   . Nasal turbinate hypertrophy 09/2011   bilat.  . Pelvic inflammatory disease     Past Surgical History:  Procedure Laterality Date  . CHOLECYSTECTOMY N/A 01/15/2016   Procedure: LAPAROSCOPIC CHOLECYSTECTOMY WITH INTRAOPERATIVE CHOLANGIOGRAM;  Surgeon: Christene Lye, MD;  Location: [facility];  Service: General;  Laterality: N/A;  . COLONOSCOPY  2007   [facility]: pedunculated 76mm polyp in distal sigmoid, sessile 85mm polyp at splenic flexure, larger polyp tubulovillous adenoma and smaller polyp tubular adenoma   . COLONOSCOPY  09-09-15  . COLONOSCOPY WITH PROPOFOL N/A 03/01/2016   Procedure: COLONOSCOPY WITH PROPOFOL;  Surgeon: Garlan Fair, MD;  Location: [facility];  Service: Endoscopy;  Laterality: N/A;  . NASAL SEPTOPLASTY W/ TURBINOPLASTY  10/04/2011   Procedure: NASAL SEPTOPLASTY WITH TURBINATE REDUCTION;  Surgeon: Ascencion Dike, MD;  Location: [facility];  Service: ENT;  Laterality: Bilateral;  . WISDOM TOOTH EXTRACTION  2009    Family History  Problem Relation Age of Onset  . Kidney cancer Mother   . Hypertension Mother   . Migraines Mother   . Multiple sclerosis Mother   . Fibromyalgia Mother   . Liver disease Mother     NASH   . Hypertension Father   . Liver disease Father     liver transplant; had fatty liver disease   . Spina bifida Brother   . Migraines Maternal Grandmother   . Hypertension Other   . Colon cancer Paternal Grandfather     Social History Social History  Substance Use Topics  . Smoking status: Former Smoker    Packs/day: 0.25    Years: 10.00    Quit date: 08/23/2008  . Smokeless tobacco: Never Used  . Alcohol use No    Allergies  Allergen Reactions  . Clarithromycin Hives and Itching  . Bentyl [Dicyclomine Hcl] Other (See Comments)    DIZZINESS  . Haldol [Haloperidol] Other (See Comments)    Made her feel very jittery and agitated  . Meclizine     Chest pain  . Morphine And Related Other (See Comments)    Made her feel like she was losing her mind  . Toradol [Ketorolac Tromethamine] Other (See Comments)    Made her feel very jittery and agitated  . Macrobid [Nitrofurantoin Monohyd Macro] Rash  . Sulfa Antibiotics Hives and Rash    Current Outpatient Prescriptions  Medication Sig Dispense Refill  . clonazePAM (KLONOPIN) 0.5 MG tablet Take 0.5 mg by mouth 2 (two) times daily.    Marland Kitchen esomeprazole (NEXIUM) 40 MG capsule Take 1 capsule (40 mg total) by mouth 2 (two) times daily before a meal. 60 capsule 2  . famotidine (PEPCID) 20 MG tablet TAKE ONE TABLET BY MOUTH ONCE  DAILY    . FLUoxetine (PROZAC) 10 MG capsule Take by mouth.    . fluticasone (FLONASE) 50 MCG/ACT nasal spray Place 2 sprays into both nostrils daily as needed for allergies or rhinitis.    Marland Kitchen ibuprofen (ADVIL,MOTRIN) 800 MG tablet Take 1 tablet (800 mg total) by mouth every 6 (six) hours as needed for moderate pain or cramping. 21 tablet 0  . levonorgestrel-ethinyl estradiol (AUBRA) 0.1-20 MG-MCG tablet Take 1 tablet by mouth daily. Reported on 12/04/2015    . montelukast (SINGULAIR) 10 MG tablet Take 10 mg by mouth at bedtime.    . Multiple Vitamins-Minerals (MULTIVITAMIN PO) Take 1 tablet by mouth every morning.    . potassium chloride SA (K-DUR,KLOR-CON) 20 MEQ tablet Take 20 mEq by mouth 2 (two) times a week.     Marland Kitchen acetaminophen (TYLENOL) 500 MG tablet Take 1 tablet (500 mg total) by mouth every 6 (six) hours as needed. 30 tablet 0  . albuterol (PROVENTIL HFA;VENTOLIN HFA) 108 (90 Base) MCG/ACT inhaler Inhale 2 puffs into the lungs every 4 (four) hours as needed for wheezing or shortness of breath. (Patient not taking: Reported on 08/31/2016) 1 Inhaler 3  . doxycycline (VIBRAMYCIN) 100 MG capsule Take 100 mg by mouth 2 (two) times daily.    Marland Kitchen levonorgestrel-ethinyl estradiol (AUBRA) 0.1-20 MG-MCG tablet Take 1 tablet by mouth daily. 1 Package 11  . loratadine (CLARITIN) 10 MG tablet Take 10 mg by mouth daily.     No current facility-administered medications for this visit.     Review of Systems Review of Systems Constitutional: negative for fatigue and weight loss Respiratory: negative for cough and wheezing Cardiovascular: negative for chest pain, fatigue and palpitations Gastrointestinal: negative for abdominal pain and change in bowel habits Genitourinary:positive for increased vaginal discharge Integument/breast: negative for nipple discharge Musculoskeletal:negative for myalgias Neurological: negative for gait problems and tremors Behavioral/Psych: negative for abusive relationship, depression Endocrine: negative for temperature intolerance      Blood pressure (!) 141/82, pulse 93, height 5\' 3"  (1.6 m), weight 164 lb (74.4 kg).  Physical Exam Physical Exam           General:  Alert and no distress Abdomen:  normal findings: no organomegaly, soft, non-tender and no hernia  Pelvis:  External genitalia: normal general appearance Urinary system: urethral meatus normal and bladder without fullness, nontender Vaginal: normal without tenderness, induration or masses Cervix: normal appearance Adnexa: normal bimanual exam Uterus: anteverted and non-tender, normal size    50% of 15 min visit spent on counseling and coordination of care.    Data Reviewed Labs  Assessment     Vaginal  Discharge    Plan    Wet prep and cultures done F/U prn  Orders Placed This Encounter  Procedures  . TSH  . Comprehensive metabolic panel  . POCT urine pregnancy   Meds ordered this encounter  Medications  . FLUoxetine (PROZAC) 10 MG capsule    Sig: Take by mouth.  . famotidine (PEPCID) 20 MG tablet    Sig: TAKE ONE TABLET BY MOUTH ONCE DAILY  . levonorgestrel-ethinyl estradiol (AUBRA) 0.1-20 MG-MCG tablet    Sig: Take 1 tablet by mouth daily.    Dispense:  1 Package    Refill:  11

## 2016-09-02 ENCOUNTER — Other Ambulatory Visit: Payer: Self-pay | Admitting: Obstetrics

## 2016-09-02 ENCOUNTER — Telehealth: Payer: Self-pay

## 2016-09-02 NOTE — Telephone Encounter (Signed)
Contacted patient to follow up about email, routed to provider for further review.

## 2016-09-03 ENCOUNTER — Other Ambulatory Visit: Payer: Self-pay | Admitting: Obstetrics

## 2016-09-03 DIAGNOSIS — B3731 Acute candidiasis of vulva and vagina: Secondary | ICD-10-CM

## 2016-09-03 DIAGNOSIS — B373 Candidiasis of vulva and vagina: Secondary | ICD-10-CM

## 2016-09-03 MED ORDER — FLUCONAZOLE 150 MG PO TABS: 150.0000 mg | ORAL_TABLET | Freq: Once | ORAL | 0 refills | Status: DC

## 2016-09-05 ENCOUNTER — Telehealth: Payer: Self-pay

## 2016-09-05 ENCOUNTER — Other Ambulatory Visit: Payer: Self-pay | Admitting: Obstetrics

## 2016-09-05 NOTE — Telephone Encounter (Signed)
Contacted patient and advised of results and rx sent.

## 2016-09-22 ENCOUNTER — Other Ambulatory Visit: Payer: Self-pay | Admitting: Physician Assistant

## 2016-09-22 DIAGNOSIS — R072 Precordial pain: Secondary | ICD-10-CM

## 2016-09-26 ENCOUNTER — Ambulatory Visit: Payer: Medicaid Other | Admitting: Allergy

## 2016-10-06 ENCOUNTER — Ambulatory Visit (INDEPENDENT_AMBULATORY_CARE_PROVIDER_SITE_OTHER): Payer: Medicaid Other | Admitting: Obstetrics

## 2016-10-06 ENCOUNTER — Encounter: Payer: Self-pay | Admitting: Obstetrics

## 2016-10-06 VITALS — BP 121/83 | HR 99 | Temp 98.1°F | Wt 163.2 lb

## 2016-10-06 DIAGNOSIS — Z3041 Encounter for surveillance of contraceptive pills: Secondary | ICD-10-CM

## 2016-10-06 DIAGNOSIS — R102 Pelvic and perineal pain: Secondary | ICD-10-CM | POA: Diagnosis not present

## 2016-10-06 MED ORDER — LEVONORGESTREL-ETHINYL ESTRAD 0.1-20 MG-MCG PO TABS: 1.0000 | ORAL_TABLET | Freq: Every day | ORAL | 4 refills | Status: DC

## 2016-10-06 NOTE — Progress Notes (Signed)
Pt states that she is here to follow up with X-ray results and states that last week she was seeing bright red when she wiped and having bloody clots. It has since turned to brown mucous that she notices in her urine.

## 2016-10-06 NOTE — Progress Notes (Signed)
Patient ID: Christina Fox, female   DOB: [birth year], 36 y.o.   MRN: [id number]  Chief Complaint  Patient presents with  . GYN    Pt is in the office for GYN visit.    HPI Christina Fox is a 36 y.o. female.  History of IBS, followed by GI.  Abdominal X-ray done ane pelvic vascular calcifications noted, and she was told to follow up with her Gyn doctor.  She has no complaints other than her usual GI complaints. HPI  Past Medical History:  Diagnosis Date  . Abdominal pain    "right lower quadrant pain and right lower back pain  . Anxiety   . Deviated septum 09/2011  . GERD (gastroesophageal reflux disease)   . Headache(784.0)    sinus  . History of echocardiogram    Echo 11/17: EF 55-60, no RWMA, normal diastolic function  . Hypertension    no meds  . Kidney stones    no current problem  . Migraine   . Nasal turbinate hypertrophy 09/2011   bilat.  . Pelvic inflammatory disease     Past Surgical History:  Procedure Laterality Date  . CHOLECYSTECTOMY N/A 01/15/2016   Procedure: LAPAROSCOPIC CHOLECYSTECTOMY WITH INTRAOPERATIVE CHOLANGIOGRAM;  Surgeon: Christene Lye, MD;  Location: [facility];  Service: General;  Laterality: N/A;  . COLONOSCOPY  2007   [facility]: pedunculated 82mm polyp in distal sigmoid, sessile 53mm polyp at splenic flexure, larger polyp tubulovillous adenoma and smaller polyp tubular adenoma   . COLONOSCOPY  09-09-15  . COLONOSCOPY WITH PROPOFOL N/A 03/01/2016   Procedure: COLONOSCOPY WITH PROPOFOL;  Surgeon: Garlan Fair, MD;  Location: [facility];  Service: Endoscopy;  Laterality: N/A;  . NASAL SEPTOPLASTY W/ TURBINOPLASTY  10/04/2011   Procedure: NASAL SEPTOPLASTY WITH TURBINATE REDUCTION;  Surgeon: Ascencion Dike, MD;  Location: [place];  Service: ENT;  Laterality: Bilateral;  . WISDOM TOOTH EXTRACTION  2009    Family History  Problem Relation Age of Onset  . Kidney cancer Mother   . Hypertension Mother   . Migraines Mother    . Multiple sclerosis Mother   . Fibromyalgia Mother   . Liver disease Mother     NASH   . Hypertension Father   . Liver disease Father     liver transplant; had fatty liver disease  . Spina bifida Brother   . Migraines Maternal Grandmother   . Hypertension Other   . Colon cancer Paternal Grandfather     Social History Social History  Substance Use Topics  . Smoking status: Former Smoker    Packs/day: 0.25    Years: 10.00    Quit date: 08/23/2008  . Smokeless tobacco: Never Used  . Alcohol use 0.0 oz/week     Comment: ocassionally    Allergies  Allergen Reactions  . Clarithromycin Hives and Itching  . Bentyl [Dicyclomine Hcl] Other (See Comments)    DIZZINESS  . Haldol [Haloperidol] Other (See Comments)    Made her feel very jittery and agitated  . Meclizine     Chest pain  . Morphine And Related Other (See Comments)    Made her feel like she was losing her mind  . Toradol [Ketorolac Tromethamine] Other (See Comments)    Made her feel very jittery and agitated  . Macrobid [Nitrofurantoin Monohyd Macro] Rash  . Sulfa Antibiotics Hives and Rash    Current Outpatient Prescriptions  Medication Sig Dispense Refill  . clonazePAM (KLONOPIN) 0.5 MG tablet Take  0.5 mg by mouth 2 (two) times daily.    . fluticasone (FLONASE) 50 MCG/ACT nasal spray Place 2 sprays into both nostrils daily as needed for allergies or rhinitis.    Marland Kitchen levocetirizine (XYZAL ALLERGY 24HR) 5 MG tablet Take 5 mg by mouth every evening.    Marland Kitchen levonorgestrel-ethinyl estradiol (AUBRA) 0.1-20 MG-MCG tablet Take 1 tablet by mouth daily. 3 Package 4  . montelukast (SINGULAIR) 10 MG tablet Take 10 mg by mouth at bedtime.    . Multiple Vitamins-Minerals (MULTIVITAMIN PO) Take 1 tablet by mouth every morning.    Marland Kitchen omeprazole (PRILOSEC) 40 MG capsule Take 40 mg by mouth daily.    . potassium chloride SA (K-DUR,KLOR-CON) 20 MEQ tablet Take 20 mEq by mouth 2 (two) times a week.    . Probiotic Product (RESTORA PO)  Take by mouth.    . Probiotic Product (RESTORA) CAPS Take by mouth.    Marland Kitchen acetaminophen (TYLENOL) 500 MG tablet Take 1 tablet (500 mg total) by mouth every 6 (six) hours as needed. 30 tablet 0  . albuterol (PROVENTIL HFA;VENTOLIN HFA) 108 (90 Base) MCG/ACT inhaler Inhale 2 puffs into the lungs every 4 (four) hours as needed for wheezing or shortness of breath. (Patient not taking: Reported on 08/31/2016) 1 Inhaler 3  . doxycycline (VIBRAMYCIN) 100 MG capsule Take 100 mg by mouth 2 (two) times daily.    . famotidine (PEPCID) 20 MG tablet TAKE ONE TABLET BY MOUTH ONCE DAILY    . FLUoxetine (PROZAC) 10 MG capsule Take by mouth.    Marland Kitchen ibuprofen (ADVIL,MOTRIN) 800 MG tablet Take 1 tablet (800 mg total) by mouth every 6 (six) hours as needed for moderate pain or cramping. (Patient not taking: Reported on 10/06/2016) 21 tablet 0  . levonorgestrel-ethinyl estradiol (AUBRA) 0.1-20 MG-MCG tablet Take 1 tablet by mouth daily. Reported on 12/04/2015    . loratadine (CLARITIN) 10 MG tablet Take 10 mg by mouth daily.    Marland Kitchen NEXIUM 40 MG capsule TAKE ONE CAPSULE BY MOUTH TWICE DAILY BEFORE A MEAL (Patient not taking: Reported on 10/06/2016) 60 capsule 2   No current facility-administered medications for this visit.     Review of Systems Review of Systems Constitutional: negative for fatigue and weight loss Respiratory: negative for cough and wheezing Cardiovascular: negative for chest pain, fatigue and palpitations Gastrointestinal: positive for abdominal pain and change in bowel habits Genitourinary:negative Integument/breast: negative for nipple discharge Musculoskeletal:negative for myalgias Neurological: negative for gait problems and tremors Behavioral/Psych: negative for abusive relationship, depression Endocrine: negative for temperature intolerance      Blood pressure 121/83, pulse 99, temperature 98.1 F (36.7 C), temperature source Oral, weight 163 lb 3.2 oz (74 kg).  Physical Exam Physical Exam:   Deferred  >50% of 10 min visit spent on counseling and coordination of care.    Data Reviewed Abdominal x-ray  Assessment     Abdominal x-ray:  Findings of pelvic vascular calcifications are physiologic and not significant for pelvic disease.    Plan    Follow up in 2 months for Annual.   No orders of the defined types were placed in this encounter.  Meds ordered this encounter  Medications  . omeprazole (PRILOSEC) 40 MG capsule    Sig: Take 40 mg by mouth daily.  Marland Kitchen levocetirizine (XYZAL ALLERGY 24HR) 5 MG tablet    Sig: Take 5 mg by mouth every evening.  . Probiotic Product (RESTORA PO)    Sig: Take by mouth.  . Probiotic Product (  RESTORA) CAPS    Sig: Take by mouth.  . levonorgestrel-ethinyl estradiol (AUBRA) 0.1-20 MG-MCG tablet    Sig: Take 1 tablet by mouth daily.    Dispense:  3 Package    Refill:  4

## 2016-10-25 ENCOUNTER — Emergency Department (HOSPITAL_BASED_OUTPATIENT_CLINIC_OR_DEPARTMENT_OTHER)
Admission: EM | Admit: 2016-10-25 | Discharge: 2016-10-25 | Disposition: A | Discharge status: Home / Self Care | Payer: Medicaid Other | Attending: Emergency Medicine | Admitting: Emergency Medicine

## 2016-10-25 ENCOUNTER — Encounter (HOSPITAL_BASED_OUTPATIENT_CLINIC_OR_DEPARTMENT_OTHER): Payer: Self-pay | Admitting: *Deleted

## 2016-10-25 DIAGNOSIS — S46812A Strain of other muscles, fascia and tendons at shoulder and upper arm level, left arm, initial encounter: Secondary | ICD-10-CM | POA: Insufficient documentation

## 2016-10-25 DIAGNOSIS — R51 Headache: Secondary | ICD-10-CM | POA: Diagnosis not present

## 2016-10-25 DIAGNOSIS — Y92410 Unspecified street and highway as the place of occurrence of the external cause: Secondary | ICD-10-CM | POA: Insufficient documentation

## 2016-10-25 DIAGNOSIS — Z87891 Personal history of nicotine dependence: Secondary | ICD-10-CM | POA: Insufficient documentation

## 2016-10-25 DIAGNOSIS — Z79899 Other long term (current) drug therapy: Secondary | ICD-10-CM | POA: Insufficient documentation

## 2016-10-25 DIAGNOSIS — S4992XA Unspecified injury of left shoulder and upper arm, initial encounter: Secondary | ICD-10-CM | POA: Diagnosis present

## 2016-10-25 DIAGNOSIS — Y9389 Activity, other specified: Secondary | ICD-10-CM | POA: Diagnosis not present

## 2016-10-25 DIAGNOSIS — I10 Essential (primary) hypertension: Secondary | ICD-10-CM | POA: Diagnosis not present

## 2016-10-25 DIAGNOSIS — R42 Dizziness and giddiness: Secondary | ICD-10-CM

## 2016-10-25 DIAGNOSIS — E876 Hypokalemia: Secondary | ICD-10-CM | POA: Insufficient documentation

## 2016-10-25 DIAGNOSIS — Y999 Unspecified external cause status: Secondary | ICD-10-CM | POA: Insufficient documentation

## 2016-10-25 LAB — BASIC METABOLIC PANEL
Anion gap: 6 (ref 5–15)
BUN: 7 mg/dL (ref 6–20)
CHLORIDE: 102 mmol/L (ref 101–111)
CO2: 28 mmol/L (ref 22–32)
Calcium: 8.9 mg/dL (ref 8.9–10.3)
Creatinine, Ser: 0.64 mg/dL (ref 0.44–1.00)
GFR calc Af Amer: >60 mL/min (ref >60)
GFR calc non Af Amer: >60 mL/min (ref >60)
Glucose, Bld: 88 mg/dL (ref 65–99)
POTASSIUM: 3.2 mmol/L — AB (ref 3.5–5.1)
SODIUM: 136 mmol/L (ref 135–145)

## 2016-10-25 LAB — URINALYSIS, ROUTINE W REFLEX MICROSCOPIC
Bilirubin Urine: NEGATIVE
Glucose, UA: NEGATIVE mg/dL
HGB URINE DIPSTICK: NEGATIVE
Ketones, ur: NEGATIVE mg/dL
LEUKOCYTES UA: NEGATIVE
NITRITE: NEGATIVE
Protein, ur: NEGATIVE mg/dL
SPECIFIC GRAVITY, URINE: 1.004 — AB (ref 1.005–1.030)
pH: 6.5 (ref 5.0–8.0)

## 2016-10-25 LAB — CBC
HCT: 37.7 % (ref 36.0–46.0)
HEMOGLOBIN: 12.8 g/dL (ref 12.0–15.0)
MCH: 30.3 pg (ref 26.0–34.0)
MCHC: 34 g/dL (ref 30.0–36.0)
MCV: 89.1 fL (ref 78.0–100.0)
Platelets: 284 10*3/uL (ref 150–400)
RBC: 4.23 MIL/uL (ref 3.87–5.11)
RDW: 12.4 % (ref 11.5–15.5)
WBC: 7 10*3/uL (ref 4.0–10.5)

## 2016-10-25 LAB — PREGNANCY, URINE: PREG TEST UR: NEGATIVE

## 2016-10-25 MED ORDER — POTASSIUM CHLORIDE CRYS ER 20 MEQ PO TBCR
40.0000 meq | EXTENDED_RELEASE_TABLET | Freq: Once | ORAL | Status: AC
  Administered 2016-10-25: 40 meq via ORAL
  Filled 2016-10-25: qty 2
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
## 2016-10-25 NOTE — Discharge Instructions (Signed)
If you were given medicines take as directed.  If you are on coumadin or contraceptives realize their levels and effectiveness is altered by many different medicines.  If you have any reaction (rash, tongues swelling, other) to the medicines stop taking and see a physician.    If your blood pressure was elevated in the ER make sure you follow up for management with a primary doctor or return for chest pain, shortness of breath or stroke symptoms.  Please follow up as directed and return to the ER or see a physician for new or worsening symptoms.  Thank you. Vitals:   10/25/16 1332  BP: (!) 156/95  Pulse: (!) 103  Resp: 18  Temp: 97.7 F (36.5 C)  SpO2: 100%
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
## 2016-10-30 ENCOUNTER — Other Ambulatory Visit: Payer: Self-pay | Admitting: Family Medicine

## 2016-11-03 ENCOUNTER — Ambulatory Visit (INDEPENDENT_AMBULATORY_CARE_PROVIDER_SITE_OTHER): Payer: Medicaid Other | Admitting: Allergy

## 2016-11-03 ENCOUNTER — Other Ambulatory Visit: Payer: Self-pay | Admitting: *Deleted

## 2016-11-03 ENCOUNTER — Other Ambulatory Visit: Payer: Self-pay

## 2016-11-03 ENCOUNTER — Encounter: Payer: Self-pay | Admitting: Allergy

## 2016-11-03 VITALS — BP 130/82 | HR 91 | Temp 98.3°F | Resp 16 | Ht 63.0 in | Wt 162.6 lb

## 2016-11-03 DIAGNOSIS — J3089 Other allergic rhinitis: Secondary | ICD-10-CM

## 2016-11-03 DIAGNOSIS — Z8709 Personal history of other diseases of the respiratory system: Secondary | ICD-10-CM | POA: Diagnosis not present

## 2016-11-03 MED ORDER — OLOPATADINE HCL 0.6 % NA SOLN: 2.0000 | Freq: Two times a day (BID) | NASAL | 5 refills | Status: DC

## 2016-11-03 MED ORDER — EPINEPHRINE 0.3 MG/0.3ML IJ SOAJ: 0.3000 mg | Freq: Once | INTRAMUSCULAR | 1 refills | Status: AC

## 2016-11-03 NOTE — Patient Instructions (Addendum)
Allergy testing today was positive for dust mite and mold and slightly reactive to birch tree.    Will obtain allergen profile to confirm you have no additional allergens.    Continue Xyzal 5 mg daily.   Continue use of Flonase 2 sprays each nostril daily  Try use of Patanase nasal antihistamine 2 sprays each nostril twice a day  Recommend nasal saline rinse daily. Do this prior to nasal spray use.  At this time would continue Singulair 10 mg daily.  Take this at bedtime.  Allergen immunotherapy (allergy shots) discussed including benefits, risk and protocol.

## 2016-11-03 NOTE — Progress Notes (Signed)
New Patient Note  RE: Christina Fox MRN: [id number] DOB: 29-Jan-1981 Date of Office Visit: 11/03/2016  Referring provider: Darden Amber, PA Primary care provider: Darden Amber, PA  Chief Complaint: allergies/sinus issues  History of present illness: Christina Fox is a 36 y.o. female presenting today for consultation for chronic allergic rhinitis and eustachian tube dysfunction.  She reports she would like to have allergy testing.    She reports symptoms of nasal congestion, headaches, ringing and pressure in the ears which makes her dizzy occasionally, some watery eyes, sneezing, some scratchy throat and generalized itchiness.  She reports the morning times her symptoms are worse.   She has been to ENT for issues with her ears.  She reports having fluid in her ears. She has been provided with Solu-Medrol injection by her PCP to help with her ear and allergy symptoms.  She reports she has been on an antibiotics every month for either ear or sinus infections.  She has been on singulair for years that she takes in the mornings.  Takes Xyzal at night due to drowsiness.  She has tried Claritin, Investment banker, operational all she feels was not effective.  She also takes flonase 2 sprays each nostril daily.  She has tried azelastine as well which she feels did not help.  She reports she does have some decreased sense of smell.    Symptoms have been worse since October 2017.   She is currently on Omnicef day 7/10 for Lt ear infection.   Dr. Melene Plan (ENT) performed her septoplasty in 2013.  She has also seen another ENT at [facility].  She does not have a history of asthma but has a history of bronchitis back in November where she was prescribed an albuterol inhaler. She has not need to use the inhaler since improved from the bronchitis.   She denies any food allergy concern or eczema.   Review of systems: Review of Systems  Constitutional: Negative for chills, fever and malaise/fatigue.  HENT:  Positive for congestion, ear pain, sinus pain and tinnitus. Negative for ear discharge, hearing loss, nosebleeds and sore throat.   Eyes: Negative for pain, discharge and redness.  Respiratory: Negative for cough, shortness of breath and wheezing.   Cardiovascular: Negative for chest pain.  Gastrointestinal: Negative for abdominal pain, heartburn, nausea and vomiting.  Musculoskeletal: Negative for joint pain and myalgias.  Skin: Negative for itching and rash.  Neurological: Negative for headaches.    All other systems negative unless noted above in HPI  Past medical history: Past Medical History:  Diagnosis Date  . Abdominal pain    "right lower quadrant pain and right lower back pain  . Anxiety   . Deviated septum 09/2011  . GERD (gastroesophageal reflux disease)   . Headache(784.0)    sinus  . History of echocardiogram    Echo 11/17: EF 55-60, no RWMA, normal diastolic function  . Hypertension    no meds  . Kidney stones    no current problem  . Migraine   . Nasal turbinate hypertrophy 09/2011   bilat.  . Pelvic inflammatory disease   . Urticaria     Past surgical history: Past Surgical History:  Procedure Laterality Date  . CHOLECYSTECTOMY N/A 01/15/2016   Procedure: LAPAROSCOPIC CHOLECYSTECTOMY WITH INTRAOPERATIVE CHOLANGIOGRAM;  Surgeon: Christene Lye, MD;  Location: [facility] OR[facility];  Service: General;  Laterality: N/A;  . COLONOSCOPY  2007   [facility]: pedunculated 23mm polyp in distal sigmoid,  sessile 21mm polyp at splenic flexure, larger polyp tubulovillous adenoma and smaller polyp tubular adenoma   . COLONOSCOPY  09-09-15  . COLONOSCOPY WITH PROPOFOL N/A 03/01/2016   Procedure: COLONOSCOPY WITH PROPOFOL;  Surgeon: Garlan Fair, MD;  Location: [facility];  Service: Endoscopy;  Laterality: N/A;  . NASAL SEPTOPLASTY W/ TURBINOPLASTY  10/04/2011   Procedure: NASAL SEPTOPLASTY WITH TURBINATE REDUCTION;  Surgeon: Ascencion Dike, MD;  Location: [facility];   Service: ENT;  Laterality: Bilateral;  . WISDOM TOOTH EXTRACTION  2009    Family history:  Family History  Problem Relation Age of Onset  . Kidney cancer Mother   . Hypertension Mother   . Migraines Mother   . Multiple sclerosis Mother   . Fibromyalgia Mother   . Liver disease Mother        NASH   . Hypertension Father   . Liver disease Father        liver transplant; had fatty liver disease  . Spina bifida Brother   . Migraines Maternal Grandmother   . Hypertension Other   . Colon cancer Paternal Grandfather   . Allergic rhinitis Daughter   . Asthma Daughter   . Allergic rhinitis Son   . Asthma Son   . Angioedema Neg Hx   . Eczema Neg Hx   . Immunodeficiency Neg Hx   . Urticaria Neg Hx     Social history: She lives in an apartment with carpeting with gas heating and central cooling. There are no pets in the home. There is no concern for water damage, mild to her roaches in the home. She is currently unemployed. Social History Main Topics  . Smoking status: Former Smoker    Packs/day: 0.25    Years: 10.00    Quit date: 08/23/2008  . Smokeless tobacco: Never Used    Medication List: Allergies as of 11/03/2016      Reactions   Clarithromycin Hives, Itching   Doxycycline Shortness Of Breath   Chest pain   Bentyl [dicyclomine Hcl] Other (See Comments)   DIZZINESS   Haldol [haloperidol] Other (See Comments)   Made her feel very jittery and agitated   Meclizine    Chest pain   Morphine And Related Other (See Comments)   Made her feel like she was losing her mind   Toradol [ketorolac Tromethamine] Other (See Comments)   Made her feel very jittery and agitated   Macrobid [nitrofurantoin Monohyd Macro] Rash   Sulfa Antibiotics Hives, Rash      Medication List       Accurate as of 11/03/16  4:52 PM. Always use your most recent med list.          acetaminophen 500 MG tablet Commonly known as:  TYLENOL Take 1 tablet (500 mg total) by mouth every 6 (six) hours  as needed.   albuterol 108 (90 Base) MCG/ACT inhaler Commonly known as:  PROVENTIL HFA;VENTOLIN HFA Inhale 2 puffs into the lungs every 4 (four) hours as needed for wheezing or shortness of breath.   cefdinir 300 MG capsule Commonly known as:  OMNICEF Take by mouth.   clonazePAM 0.5 MG tablet Commonly known as:  KLONOPIN Take 0.5 mg by mouth 2 (two) times daily.   EPINEPHrine 0.3 mg/0.3 mL Soaj injection Commonly known as:  EPI-PEN Inject 0.3 mLs (0.3 mg total) into the muscle once.   FLUoxetine 10 MG capsule Commonly known as:  PROZAC Take by mouth.   fluticasone 50 MCG/ACT nasal spray  Commonly known as:  FLONASE Place 2 sprays into both nostrils daily as needed for allergies or rhinitis.   ibuprofen 800 MG tablet Commonly known as:  ADVIL,MOTRIN Take 1 tablet (800 mg total) by mouth every 6 (six) hours as needed for moderate pain or cramping.   lamoTRIgine 25 MG tablet Commonly known as:  LAMICTAL Take one tablet for 7 days, then two tablets for 7 days, then 3 tablets for 7 days, then 4 tablets daily.   levonorgestrel-ethinyl estradiol 0.1-20 MG-MCG tablet Commonly known as:  AUBRA Take 1 tablet by mouth daily.   montelukast 10 MG tablet Commonly known as:  SINGULAIR Take 10 mg by mouth at bedtime.   MULTIVITAMIN PO Take 1 tablet by mouth every morning.   Olopatadine HCl 0.6 % Soln Commonly known as:  PATANASE Place 2 drops (2 puffs total) into both nostrils 2 (two) times daily.   PEPPERMINT OIL PO   potassium chloride SA 20 MEQ tablet Commonly known as:  K-DUR,KLOR-CON Take by mouth.   RESTORA Caps Take by mouth.   XYZAL ALLERGY 24HR 5 MG tablet Generic drug:  levocetirizine Take 5 mg by mouth every evening.       Known medication allergies: Allergies  Allergen Reactions  . Clarithromycin Hives and Itching  . Doxycycline Shortness Of Breath    Chest pain  . Bentyl [Dicyclomine Hcl] Other (See Comments)    DIZZINESS  . Haldol [Haloperidol]  Other (See Comments)    Made her feel very jittery and agitated  . Meclizine     Chest pain  . Morphine And Related Other (See Comments)    Made her feel like she was losing her mind  . Toradol [Ketorolac Tromethamine] Other (See Comments)    Made her feel very jittery and agitated  . Macrobid [Nitrofurantoin Monohyd Macro] Rash  . Sulfa Antibiotics Hives and Rash     Physical examination: Blood pressure 130/82, pulse 91, temperature 98.3 F (36.8 C), temperature source Oral, resp. rate 16, height 5\' 3"  (1.6 m), weight 162 lb 9.6 oz (73.8 kg), SpO2 97 %.  General: Alert, interactive, in no acute distress. HEENT: TMs pearly gray, turbinates moderately edematous with clear discharge, post-pharynx non erythematous with copious clear drainage Neck: Supple without lymphadenopathy. Lungs: Clear to auscultation without wheezing, rhonchi or rales. {no increased work of breathing. CV: Normal S1, S2 without murmurs. Abdomen: Nondistended, nontender. Skin: Warm and dry, without lesions or rashes. Extremities:  No clubbing, cyanosis or edema. Neuro:   Grossly intact.  Diagnositics/Labs  Spirometry: FEV1: 2.23L  79%, FVC: 2.81L  85%, ratio consistent with Nonobstructive pattern  Allergy testing: skin prick testing for environmental allergens was slightly positive for birch tree.  Intradermal testing was positive for mold mix 2 and dust mites Allergy testing results were read and interpreted by provider, documented by clinical staff.   Assessment and plan:   Allergic rhinitis  - Allergy testing today was positive for dust mite and mold and slightly reactive to birch tree.  Based on testing today she may be having issues to daily exposure to dust mite. However she does report being more symptomatic currently and believe that she may have more sensitivities besides to birch. Thus will obtain environmental allergen profile to determine if she has any other sensitivities. - Continue Xyzal 5 mg  daily.  - Continue use of Flonase 2 sprays each nostril daily - Try use of Patanase nasal antihistamine 2 sprays each nostril twice a day - Recommend nasal saline rinse  daily. Do this prior to nasal spray use. - At this time would continue Singulair 10 mg daily.  Take this at bedtime. - Allergen immunotherapy (allergy shots) discussed including benefits, risk and protocol. Patient is interested in starting on injections and will proceed following return of her environmental panel  History of bronchitis  - She developed bronchitis in November 2017 which has resolved. She was provided with an albuterol inhaler during this time. She has not needed to use this inhaler after resolution of her bronchitis. She did have a spirometry done today that did not show any evidence of obstruction.  Follow-up in 4-6 months  I appreciate the opportunity to take part in [facility] care. Please do not hesitate to contact me with questions.  Sincerely,   Prudy Feeler, MD Allergy/Immunology [facility]

## 2016-11-06 ENCOUNTER — Emergency Department (HOSPITAL_COMMUNITY): Payer: Medicaid Other

## 2016-11-06 ENCOUNTER — Encounter (HOSPITAL_COMMUNITY): Payer: Self-pay | Admitting: Emergency Medicine

## 2016-11-06 ENCOUNTER — Emergency Department (HOSPITAL_COMMUNITY)
Admission: EM | Admit: 2016-11-06 | Discharge: 2016-11-06 | Disposition: A | Discharge status: Home / Self Care | Payer: Medicaid Other | Attending: Emergency Medicine | Admitting: Emergency Medicine

## 2016-11-06 DIAGNOSIS — I10 Essential (primary) hypertension: Secondary | ICD-10-CM | POA: Diagnosis not present

## 2016-11-06 DIAGNOSIS — R0789 Other chest pain: Secondary | ICD-10-CM

## 2016-11-06 DIAGNOSIS — R079 Chest pain, unspecified: Secondary | ICD-10-CM | POA: Diagnosis present

## 2016-11-06 DIAGNOSIS — Z79899 Other long term (current) drug therapy: Secondary | ICD-10-CM | POA: Diagnosis not present

## 2016-11-06 LAB — BASIC METABOLIC PANEL
Anion gap: 7 (ref 5–15)
BUN: 6 mg/dL (ref 6–20)
CHLORIDE: 106 mmol/L (ref 101–111)
CO2: 25 mmol/L (ref 22–32)
CREATININE: 0.6 mg/dL (ref 0.44–1.00)
Calcium: 9 mg/dL (ref 8.9–10.3)
GFR calc non Af Amer: >60 mL/min (ref >60)
Glucose, Bld: 108 mg/dL — ABNORMAL HIGH (ref 65–99)
Potassium: 3.7 mmol/L (ref 3.5–5.1)
Sodium: 138 mmol/L (ref 135–145)

## 2016-11-06 LAB — I-STAT TROPONIN, ED: Troponin i, poc: 0 ng/mL (ref 0.00–0.08)

## 2016-11-06 LAB — CBC
HEMATOCRIT: 38.1 % (ref 36.0–46.0)
HEMOGLOBIN: 12.8 g/dL (ref 12.0–15.0)
MCH: 30 pg (ref 26.0–34.0)
MCHC: 33.6 g/dL (ref 30.0–36.0)
MCV: 89.2 fL (ref 78.0–100.0)
Platelets: 283 10*3/uL (ref 150–400)
RBC: 4.27 MIL/uL (ref 3.87–5.11)
RDW: 12.7 % (ref 11.5–15.5)
WBC: 5.2 10*3/uL (ref 4.0–10.5)

## 2016-11-06 NOTE — Discharge Instructions (Signed)
Return to the ED with any concerns including shortness of breath, leg swelling, fainting, weakness of arms or legs, or any other alarming symptoms

## 2016-11-06 NOTE — ED Triage Notes (Signed)
Patient c/o intermittent sharp right sided chest pain with left arm tingling since yesterday. Patient also c/o left posterior knee pain. Denies injury. Hx GERD and anxiety. Denies pain radiation, N/V/D, SOB, and dizziness.

## 2016-11-06 NOTE — ED Provider Notes (Signed)
[facility] DEPT Provider Note   CSN: [id number] Arrival date & time: 11/06/16  1051     History   Chief Complaint Chief Complaint  Patient presents with  . Chest Pain    HPI Christina Fox is a 36 y.o. female.  HPI  Pt presenting with c/o left arm tingling which started yesterday as well as right sided sharp chest pain.  Pain started while she was sitting in her car yesterday.  After going inside her house she was sitting on the couch and symptoms continued.  She stood up from a seated position and felt a pain in her right knee.  She states from then on her right knee has been hurting when she walks on it.  Pain is in the tendon behind her right knee.  No shortness of breath.  No pleuritic nature of pain.  No fever/chillls, no cough. Pt has had numerous other visits to the ED for chest pain.  There are no other associated systemic symptoms, there are no other alleviating or modifying factors.   Past Medical History:  Diagnosis Date  . Abdominal pain    "right lower quadrant pain and right lower back pain  . Anxiety   . Deviated septum 09/2011  . GERD (gastroesophageal reflux disease)   . Headache(784.0)    sinus  . History of echocardiogram    Echo 11/17: EF 55-60, no RWMA, normal diastolic function  . Hypertension    no meds  . Kidney stones    no current problem  . Migraine   . Nasal turbinate hypertrophy 09/2011   bilat.  . Pelvic inflammatory disease   . Urticaria     Patient Active Problem List   Diagnosis Date Noted  . Gallstone 01/07/2016  . History of colonic polyps 11/27/2015  . Right lower quadrant abdominal pain 11/27/2015  . HTN (hypertension), benign 11/24/2015  . Abdominal pain 11/16/2015  . Headache 10/13/2015  . 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 Anxiety disorder 11/03/2014    Past Surgical History:  Procedure Laterality Date  . CHOLECYSTECTOMY N/A 01/15/2016   Procedure: LAPAROSCOPIC CHOLECYSTECTOMY WITH INTRAOPERATIVE CHOLANGIOGRAM;  Surgeon:  Christene Lye, MD;  Location: [facility];  Service: General;  Laterality: N/A;  . COLONOSCOPY  2007   [facility]: pedunculated 67mm polyp in distal sigmoid, sessile 82mm polyp at splenic flexure, larger polyp tubulovillous adenoma and smaller polyp tubular adenoma   . COLONOSCOPY  09-09-15  . COLONOSCOPY WITH PROPOFOL N/A 03/01/2016   Procedure: COLONOSCOPY WITH PROPOFOL;  Surgeon: Garlan Fair, MD;  Location: [facility];  Service: Endoscopy;  Laterality: N/A;  . NASAL SEPTOPLASTY W/ TURBINOPLASTY  10/04/2011   Procedure: NASAL SEPTOPLASTY WITH TURBINATE REDUCTION;  Surgeon: Ascencion Dike, MD;  Location: [facility];  Service: ENT;  Laterality: Bilateral;  . WISDOM TOOTH EXTRACTION  2009    OB History    Gravida Para Term Preterm AB Living   4 4 4     4    SAB TAB Ectopic Multiple Live Births           4      Obstetric Comments   1st Menstrual Cycle:  10  1st Pregnancy:  16        Home Medications    Prior to Admission medications   Medication Sig Start Date End Date Taking? Authorizing Provider  acetaminophen (TYLENOL) 500 MG tablet Take 1 tablet (500 mg total) by mouth every 6 (six) hours as needed. 08/31/16   Waynetta Pean,  PA-C  albuterol (PROVENTIL HFA;VENTOLIN HFA) 108 (90 Base) MCG/ACT inhaler Inhale 2 puffs into the lungs every 4 (four) hours as needed for wheezing or shortness of breath. Patient not taking: Reported on 08/31/2016 05/22/16   Noemi Chapel, MD  clonazePAM (KLONOPIN) 0.5 MG tablet Take 0.5 mg by mouth 2 (two) times daily.    [provider]  FLUoxetine (PROZAC) 10 MG capsule Take by mouth. 08/25/16   [provider]  fluticasone (FLONASE) 50 MCG/ACT nasal spray Place 2 sprays into both nostrils daily as needed for allergies or rhinitis.    [provider]  ibuprofen (ADVIL,MOTRIN) 800 MG tablet Take 1 tablet (800 mg total) by mouth every 6 (six) hours as needed for moderate pain or cramping. Patient not taking: Reported  on 10/06/2016 06/18/16   Leo Grosser, MD  lamoTRIgine (LAMICTAL) 25 MG tablet Take one tablet for 7 days, then two tablets for 7 days, then 3 tablets for 7 days, then 4 tablets daily. 10/25/16   [provider]  levocetirizine (XYZAL ALLERGY 24HR) 5 MG tablet Take 5 mg by mouth every evening.    [provider]  levonorgestrel-ethinyl estradiol (AUBRA) 0.1-20 MG-MCG tablet Take 1 tablet by mouth daily. 10/06/16   Shelly Bombard, MD  montelukast (SINGULAIR) 10 MG tablet Take 10 mg by mouth at bedtime.    [provider]  Multiple Vitamins-Minerals (MULTIVITAMIN PO) Take 1 tablet by mouth every morning.    [provider]  Olopatadine HCl (PATANASE) 0.6 % SOLN Place 2 drops (2 puffs total) into both nostrils 2 (two) times daily. 11/03/16   Kennith Gain, MD  PEPPERMINT OIL PO  10/04/16   [provider]  potassium chloride SA (K-DUR,KLOR-CON) 20 MEQ tablet Take by mouth. 10/31/16   [provider]  Probiotic Product (RESTORA) CAPS Take by mouth.    [provider]    Family History Family History  Problem Relation Age of Onset  . Kidney cancer Mother   . Hypertension Mother   . Migraines Mother   . Multiple sclerosis Mother   . Fibromyalgia Mother   . Liver disease Mother        NASH   . Hypertension Father   . Liver disease Father        liver transplant; had fatty liver disease  . Spina bifida Brother   . Migraines Maternal Grandmother   . Hypertension Other   . Colon cancer Paternal Grandfather   . Allergic rhinitis Daughter   . Asthma Daughter   . Allergic rhinitis Son   . Asthma Son   . Angioedema Neg Hx   . Eczema Neg Hx   . Immunodeficiency Neg Hx   . Urticaria Neg Hx     Social History Social History  Substance Use Topics  . Smoking status: Former Smoker    Packs/day: 0.25    Years: 10.00    Quit date: 08/23/2008  . Smokeless tobacco: Never Used  . Alcohol use 0.0 oz/week     Comment:  ocassionally     Allergies   Clarithromycin; Doxycycline; Bentyl [dicyclomine hcl]; Haldol [haloperidol]; Meclizine; Morphine and related; Toradol [ketorolac tromethamine]; Macrobid [nitrofurantoin monohyd macro]; and Sulfa antibiotics   Review of Systems Review of Systems  ROS reviewed and all otherwise negative except for mentioned in HPI   Physical Exam Updated Vital Signs BP 138/76 (BP Location: Right Arm)   Pulse 98   Temp 98.1 F (36.7 C) (Oral)   Resp 18  Ht 5\' 3"  (1.6 m)   Wt 73.5 kg   SpO2 99%   BMI 28.70 kg/m  Vitals reviewed Physical Exam Physical Examination: General appearance - alert, well appearing, and in no distress Mental status - alert, oriented to person, place, and time Eyes - no conjunctival injection, no scleral icterus Mouth - mucous membranes moist, pharynx normal without lesions Neck - supple, no significant adenopathy Chest - clear to auscultation, no wheezes, rales or rhonchi, symmetric air entry Heart - normal rate, regular rhythm, normal S1, S2, no murmurs, rubs, clicks or gallops, 2+ radial pulses bilaterally Abdomen - soft, nontender, nondistended, no masses or organomegaly Neurological - alert, oriented, normal speech, strength 5/5 in extremities x 4, sensation intact Extremities - peripheral pulses normal, no pedal edema, no clubbing or cyanosis, negative homan's sign, no palpable cords, ttp over tendon in medial popliteal fossa Skin - normal coloration and turgor, no rashes  ED Treatments / Results  Labs (all labs ordered are listed, but only abnormal results are displayed) Labs Reviewed  BASIC METABOLIC PANEL - Abnormal; Notable for the following:       Result Value   Glucose, Bld 108 (*)    All other components within normal limits  CBC  I-STAT TROPOININ, ED    EKG  EKG Interpretation  Date/Time:  Sunday Nov 06 2016 11:13:23 EDT Ventricular Rate:  93 PR Interval:    QRS Duration: 87 QT Interval:  335 QTC  Calculation: 417 R Axis:   92 Text Interpretation:  Sinus rhythm Borderline right axis deviation Probable LVH with secondary repol abnrm ekg is similar to prior ekg of  Jan 2016 Confirmed by Canary Brim  MD, MARTHA [phone]) on 11/06/2016 11:29:07 AM Also confirmed by Canary Brim  MD, MARTHA [phone]), editor Hattie Perch ([id number])  on 11/06/2016 12:43:35 PM       Radiology Dg Chest 2 View  Result Date: 11/06/2016 CLINICAL DATA:  Chest pain. EXAM: CHEST  2 VIEW COMPARISON:  August 31, 2016 FINDINGS: The heart size and mediastinal contours are within normal limits. Both lungs are clear. The visualized skeletal structures are unremarkable. IMPRESSION: No active cardiopulmonary disease. Electronically Signed   By: Dorise Bullion III M.D   On: 11/06/2016 11:50    Procedures Procedures (including critical care time)  Medications Ordered in ED Medications - No data to display   Initial Impression / Assessment and Plan / ED Course  I have reviewed the triage vital signs and the nursing notes.  Pertinent labs & imaging results that were available during my care of the patient were reviewed by me and considered in my medical decision making (see chart for details).     Pt presenting with c/o right sided chest pain with some paresthesias and tingling in left hand.  Also c/o pain in left knee with weight bearing.  Knee pain is due to tenderness over tendon- possibly a sprain.  No swelling or deformity or specific injury- do not feel this is related to the chest pain as in highly doubt this is a sign of DVT based on exam.  Pt is PERC O, low heart score, doubt ACS.  Labs, EKG, CXR are reassuring.  All results and decision making discussed with patient and she is in agreement with plan for discharge.  Discharged with strict return precautions.  Pt agreeable with plan.  Final Clinical Impressions(s) / ED Diagnoses   Final diagnoses:  Atypical chest pain    New Prescriptions Discharge Medication List as of  11/06/2016  1:40 PM       Alfonzo Beers, MD 11/08/16 [phone]

## 2016-11-08 ENCOUNTER — Encounter: Payer: Self-pay | Admitting: Cardiology

## 2016-11-08 LAB — ALLERGENS, ZONE 3
Alternaria Alternata IgE: <0.1 kU/L
Aspergillus Fumigatus IgE: <0.1 kU/L
Bahia Grass IgE: <0.1 kU/L
Bermuda Grass IgE: <0.1 kU/L
Cedar, Mountain IgE: <0.1 kU/L
Cockroach, American IgE: <0.1 kU/L
Common Silver Birch IgE: <0.1 kU/L
Kentucky Bluegrass IgE: <0.1 kU/L
Maple/Box Elder IgE: <0.1 kU/L
Mucor Racemosus IgE: <0.1 kU/L
Nettle IgE: <0.1 kU/L
Plantain, English IgE: <0.1 kU/L
Ragweed, Short IgE: <0.1 kU/L
Stemphylium Herbarum IgE: <0.1 kU/L

## 2016-11-16 ENCOUNTER — Other Ambulatory Visit: Payer: Self-pay | Admitting: Allergy

## 2016-11-16 DIAGNOSIS — J3089 Other allergic rhinitis: Secondary | ICD-10-CM

## 2016-11-16 NOTE — Progress Notes (Signed)
Vials to be made 11-16-16 jm

## 2016-11-17 DIAGNOSIS — J301 Allergic rhinitis due to pollen: Secondary | ICD-10-CM | POA: Diagnosis not present

## 2016-11-18 DIAGNOSIS — J3089 Other allergic rhinitis: Secondary | ICD-10-CM | POA: Diagnosis not present

## 2016-12-01 ENCOUNTER — Ambulatory Visit: Payer: Medicaid Other

## 2016-12-07 ENCOUNTER — Emergency Department (HOSPITAL_COMMUNITY)
Admission: EM | Admit: 2016-12-07 | Discharge: 2016-12-08 | Disposition: A | Discharge status: Home / Self Care | Payer: Medicaid Other | Attending: Emergency Medicine | Admitting: Emergency Medicine

## 2016-12-07 ENCOUNTER — Encounter (HOSPITAL_COMMUNITY): Payer: Self-pay | Admitting: Emergency Medicine

## 2016-12-07 ENCOUNTER — Other Ambulatory Visit: Payer: Self-pay

## 2016-12-07 ENCOUNTER — Emergency Department (HOSPITAL_COMMUNITY): Payer: Medicaid Other

## 2016-12-07 DIAGNOSIS — Z79899 Other long term (current) drug therapy: Secondary | ICD-10-CM | POA: Diagnosis not present

## 2016-12-07 DIAGNOSIS — I10 Essential (primary) hypertension: Secondary | ICD-10-CM | POA: Insufficient documentation

## 2016-12-07 DIAGNOSIS — R42 Dizziness and giddiness: Secondary | ICD-10-CM

## 2016-12-07 DIAGNOSIS — R0789 Other chest pain: Secondary | ICD-10-CM | POA: Diagnosis not present

## 2016-12-07 DIAGNOSIS — R1033 Periumbilical pain: Secondary | ICD-10-CM | POA: Diagnosis not present

## 2016-12-07 DIAGNOSIS — Z87891 Personal history of nicotine dependence: Secondary | ICD-10-CM | POA: Diagnosis not present

## 2016-12-07 DIAGNOSIS — R5383 Other fatigue: Secondary | ICD-10-CM | POA: Diagnosis not present

## 2016-12-07 DIAGNOSIS — R109 Unspecified abdominal pain: Secondary | ICD-10-CM

## 2016-12-07 LAB — CBC
HEMATOCRIT: 38.5 % (ref 36.0–46.0)
Hemoglobin: 12.6 g/dL (ref 12.0–15.0)
MCH: 28.9 pg (ref 26.0–34.0)
MCHC: 32.7 g/dL (ref 30.0–36.0)
MCV: 88.3 fL (ref 78.0–100.0)
Platelets: 311 10*3/uL (ref 150–400)
RBC: 4.36 MIL/uL (ref 3.87–5.11)
RDW: 12.5 % (ref 11.5–15.5)
WBC: 7.6 10*3/uL (ref 4.0–10.5)

## 2016-12-07 LAB — BASIC METABOLIC PANEL
Anion gap: 9 (ref 5–15)
BUN: 10 mg/dL (ref 6–20)
CHLORIDE: 103 mmol/L (ref 101–111)
CO2: 28 mmol/L (ref 22–32)
CREATININE: 0.69 mg/dL (ref 0.44–1.00)
Calcium: 9.1 mg/dL (ref 8.9–10.3)
GFR calc non Af Amer: >60 mL/min (ref >60)
Glucose, Bld: 96 mg/dL (ref 65–99)
POTASSIUM: 3.7 mmol/L (ref 3.5–5.1)
SODIUM: 140 mmol/L (ref 135–145)

## 2016-12-07 LAB — D-DIMER, QUANTITATIVE (NOT AT ARMC)

## 2016-12-07 LAB — POCT I-STAT TROPONIN I: Troponin i, poc: 0.01 ng/mL (ref 0.00–0.08)

## 2016-12-07 LAB — TROPONIN I: Troponin I: <0.03 ng/mL (ref <0.03)

## 2016-12-07 NOTE — ED Provider Notes (Signed)
[facility] DEPT Provider Note   CSN: [id number] Arrival date & time: 12/07/16  1744     History   Chief Complaint Chief Complaint  Patient presents with  . Chest Pain  . Abdominal Pain  . Fatigue    HPI Christina Fox is a 36 y.o. female presenting with 4 days of generalized fatigue, sleeping more than 12 hours per day and napping which is uncommon for her. She states that she's been feeling weak and last night felt some left chest tenderness and had some coughing. She explains that she feels like she can barely walk she was so weak and lightheaded as if she was going to pass out. She has also been experiencing some flank pain more on the right than the left and nonbloody soft stools. She denies nausea, vomiting. She reports some urinary frequency but no dysuria, hematuria, back pain, fever, chills. She states that her chest is tender and it hurts on her sternal region when she takes deep breaths. She is on a low estrogen birth control pill, denies history of DVT/PE, malignancy, recent surgery, prolonged immobilization, hemoptysis.  HPI  Past Medical History:  Diagnosis Date  . Abdominal pain    "right lower quadrant pain and right lower back pain  . Anxiety   . Deviated septum 09/2011  . GERD (gastroesophageal reflux disease)   . Headache(784.0)    sinus  . History of echocardiogram    Echo 11/17: EF 55-60, no RWMA, normal diastolic function  . Hypertension    no meds  . Kidney stones    no current problem  . Migraine   . Nasal turbinate hypertrophy 09/2011   bilat.  . Pelvic inflammatory disease   . Urticaria     Patient Active Problem List   Diagnosis Date Noted  . Gallstone 01/07/2016  . History of colonic polyps 11/27/2015  . Right lower quadrant abdominal pain 11/27/2015  . HTN (hypertension), benign 11/24/2015  . Abdominal pain 11/16/2015  . Headache 10/13/2015  . 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 Anxiety disorder 11/03/2014    Past Surgical History:    Procedure Laterality Date  . CHOLECYSTECTOMY N/A 01/15/2016   Procedure: LAPAROSCOPIC CHOLECYSTECTOMY WITH INTRAOPERATIVE CHOLANGIOGRAM;  Surgeon: Christene Lye, MD;  Location: [facility] ORS;  Service: General;  Laterality: N/A;  . COLONOSCOPY  2007   [facility]: pedunculated 82mm polyp in distal sigmoid, sessile 46mm polyp at splenic flexure, larger polyp tubulovillous adenoma and smaller polyp tubular adenoma   . COLONOSCOPY  09-09-15  . COLONOSCOPY WITH PROPOFOL N/A 03/01/2016   Procedure: COLONOSCOPY WITH PROPOFOL;  Surgeon: Garlan Fair, MD;  Location: [facility];  Service: Endoscopy;  Laterality: N/A;  . NASAL SEPTOPLASTY W/ TURBINOPLASTY  10/04/2011   Procedure: NASAL SEPTOPLASTY WITH TURBINATE REDUCTION;  Surgeon: Ascencion Dike, MD;  Location: [facility];  Service: ENT;  Laterality: Bilateral;  . WISDOM TOOTH EXTRACTION  2009    OB History    Gravida Para Term Preterm AB Living   4 4 4     4    SAB TAB Ectopic Multiple Live Births           4      Obstetric Comments   1st Menstrual Cycle:  10  1st Pregnancy:  16        Home Medications    Prior to Admission medications   Medication Sig Start Date End Date Taking? Authorizing Provider  acetaminophen (TYLENOL) 500 MG tablet Take 1 tablet (500 mg total)  by mouth every 6 (six) hours as needed. Patient taking differently: Take 1,000 mg by mouth every 8 (eight) hours as needed.  08/31/16  Yes Waynetta Pean, PA-C  cetirizine (ZYRTEC) 10 MG tablet Take 10 mg by mouth daily.   Yes [provider]  clonazePAM (KLONOPIN) 0.5 MG tablet Take 0.25 mg by mouth daily.    Yes [provider]  fluticasone (FLONASE) 50 MCG/ACT nasal spray Place 2 sprays into both nostrils daily as needed for allergies or rhinitis.   Yes [provider]  levonorgestrel-ethinyl estradiol (AUBRA) 0.1-20 MG-MCG tablet Take 1 tablet by mouth daily. 10/06/16  Yes Shelly Bombard, MD  montelukast (SINGULAIR) 10 MG tablet  Take 10 mg by mouth at bedtime.   Yes [provider]  Multiple Vitamin (MULTIVITAMIN) tablet Take 1 tablet by mouth daily.   Yes [provider]  Multiple Vitamins-Minerals (MULTIVITAMIN PO) Take 1 tablet by mouth every morning.   Yes [provider]  Olopatadine HCl (PATANASE) 0.6 % SOLN Place 2 drops (2 puffs total) into both nostrils 2 (two) times daily. 11/03/16  Yes Padgett, Rae Halsted, MD  omeprazole (PRILOSEC) 40 MG capsule Take 40 mg by mouth daily.   Yes [provider]  potassium chloride SA (K-DUR,KLOR-CON) 20 MEQ tablet Take by mouth. 10/31/16  Yes [provider]    Family History Family History  Problem Relation Age of Onset  . Kidney cancer Mother   . Hypertension Mother   . Migraines Mother   . Multiple sclerosis Mother   . Fibromyalgia Mother   . Liver disease Mother        NASH   . Hypertension Father   . Liver disease Father        liver transplant; had fatty liver disease  . Spina bifida Brother   . Migraines Maternal Grandmother   . Hypertension Other   . Colon cancer Paternal Grandfather   . Allergic rhinitis Daughter   . Asthma Daughter   . Allergic rhinitis Son   . Asthma Son   . Angioedema Neg Hx   . Eczema Neg Hx   . Immunodeficiency Neg Hx   . Urticaria Neg Hx     Social History Social History  Substance Use Topics  . Smoking status: Former Smoker    Packs/day: 0.25    Years: 10.00    Quit date: 08/23/2008  . Smokeless tobacco: Never Used  . Alcohol use 0.0 oz/week     Comment: ocassionally     Allergies   Clarithromycin; Doxycycline; Bentyl [dicyclomine hcl]; Haldol [haloperidol]; Meclizine; Morphine and related; Toradol [ketorolac tromethamine]; Macrobid [nitrofurantoin monohyd macro]; and Sulfa antibiotics   Review of Systems Review of Systems  Constitutional: Negative for chills and fever.  HENT: Negative for sore throat.   Eyes: Negative for pain and visual disturbance.    Respiratory: Negative for cough, shortness of breath, wheezing and stridor.   Cardiovascular: Positive for chest pain. Negative for palpitations and leg swelling.  Gastrointestinal: Positive for abdominal pain, diarrhea, nausea and vomiting. Negative for abdominal distention and blood in stool.       She feels like there is a "knott" in her umbilical region  Genitourinary: Positive for flank pain. Negative for difficulty urinating, dysuria, frequency, hematuria, pelvic pain, urgency, vaginal bleeding, vaginal discharge and vaginal pain.  Musculoskeletal: Negative for arthralgias, back pain, myalgias, neck pain and neck stiffness.  Skin: Negative for color change, pallor, rash and wound.  Neurological: Positive for dizziness and light-headedness. Negative  for seizures, syncope, facial asymmetry, weakness and numbness.     Physical Exam Updated Vital Signs BP (!) 154/74 (BP Location: Left Arm)   Pulse 81   Temp 98.5 F (36.9 C) (Oral)   Resp 18   Ht 5\' 2"  (1.575 m)   Wt 78.9 kg (174 lb)   SpO2 100%   BMI 31.83 kg/m   Physical Exam  Constitutional: She is oriented to person, place, and time. She appears well-developed and well-nourished. No distress.  Febrile, nontoxic appearing, sitting comfortably in bed in no acute distress.  HENT:  Head: Normocephalic and atraumatic.  Eyes: Conjunctivae and EOM are normal. Pupils are equal, round, and reactive to light.  Neck: Normal range of motion. Neck supple.  Cardiovascular: Normal rate, regular rhythm and normal heart sounds.   No murmur heard. Pulmonary/Chest: Effort normal and breath sounds normal. No respiratory distress. She has no wheezes. She has no rales.  Abdominal: Soft. She exhibits no distension and no mass. There is tenderness. There is no rebound and no guarding. No hernia.  Periumbilical, right flank and left flank tenderness to palpation.  Negative Murphy sign, negative McBurney's point tenderness, negative mass or rebound.  No peritoneal signs.  Musculoskeletal: Normal range of motion. She exhibits no edema.  Neurological: She is alert and oriented to person, place, and time. No cranial nerve deficit. She exhibits normal muscle tone. Coordination normal.  Skin: Skin is warm and dry. No rash noted. She is not diaphoretic. No erythema. No pallor.  Psychiatric: She has a normal mood and affect.  Nursing note and vitals reviewed.    ED Treatments / Results  Labs (all labs ordered are listed, but only abnormal results are displayed) Labs Reviewed  URINALYSIS, ROUTINE W REFLEX MICROSCOPIC - Abnormal; Notable for the following:       Result Value   Color, Urine STRAW (*)    Hgb urine dipstick SMALL (*)    Bacteria, UA MANY (*)    Squamous Epithelial / LPF 0-5 (*)    All other components within normal limits  BASIC METABOLIC PANEL  CBC  TROPONIN I  D-DIMER, QUANTITATIVE (NOT AT [facility])  PREGNANCY, URINE  I-STAT TROPOININ, ED  POCT I-STAT TROPONIN I    EKG  EKG Interpretation None       Radiology Dg Chest 2 View  Result Date: 12/07/2016 CLINICAL DATA:  Chest pain onset last night, radiating to the left upper quadrant. Hypertension. EXAM: CHEST  2 VIEW COMPARISON:  11/06/2016 FINDINGS: The heart size and mediastinal contours are within normal limits. Both lungs are clear. The visualized skeletal structures are unremarkable. IMPRESSION: No active cardiopulmonary disease. Electronically Signed   By: Van Clines M.D.   On: 12/07/2016 18:42   US Renal  Result Date: 12/07/2016 CLINICAL DATA:  Acute onset of bilateral flank pain. Initial encounter. EXAM: RENAL / URINARY TRACT ULTRASOUND COMPLETE COMPARISON:  CT of the abdomen and pelvis performed 02/17/2016, and renal ultrasound performed 04/12/2016 FINDINGS: Right Kidney: Length: 10.6 cm. Echogenicity within normal limits. No mass or hydronephrosis visualized. The previously noted tiny right renal stone is not well characterized on ultrasound. Left  Kidney: Length: 10.8 cm. Echogenicity within normal limits. No mass or hydronephrosis visualized. Bladder: Appears normal for degree of bladder distention. Bilateral ureteral jets are visualized. IMPRESSION: Unremarkable renal ultrasound.  No evidence of hydronephrosis. Electronically Signed   By: Garald Balding M.D.   On: 12/07/2016 23:41    Procedures Procedures (including critical care time)  Medications Ordered in  ED Medications - No data to display   Initial Impression / Assessment and Plan / ED Course  I have reviewed the triage vital signs and the nursing notes.  Pertinent labs & imaging results that were available during my care of the patient were reviewed by me and considered in my medical decision making (see chart for details).     Patient presenting with multiple complaints including bilateral flank pain, chest discomfort, nausea, generalized fatigue, lightheadedness, nausea, vomiting, diarrhea. Can't use PERC due to contraceptives use. Low suspicion for PE. Sats 100% RA, normal HR, no SOB. Negative D-dimer  Reassuring exam, patient is being followed by gastroenterology and has seen a cardiologist last year with stress test and echo. Echo on 05/17/16 "Left ventricle: The cavity size was normal. Systolic function was   normal. The estimated ejection fraction was in the range of 55%   to 60%. Wall motion was normal; there were no regional wall   motion abnormalities. Left ventricular diastolic function   parameters were normal."  She just saw her PCP who felt that her current symptoms were likely related to her IBS but she does not believe this is the case.   Ordered an ultrasound renal, which was negative for hydronephrosis or evidence of obstructive stone.  Resulted labs unremarkable. Exam reassuring. Patient is afebrile, nontoxic and well-appearing.  Transferred patient care at end of shift to [facility] pending EKG,  urine analysis and pregnancy urine review  . Anticipate discharge home with close PCP follow up if all negative.  Final Clinical Impressions(s) / ED Diagnoses   Final diagnoses:  Bilateral flank pain  Chest wall pain  Fatigue, unspecified type  Episodic lightheadedness    New Prescriptions New Prescriptions   No medications on file     Dossie Der 12/08/16 0127    Duffy Bruce, MD 12/08/16 [phone]

## 2016-12-07 NOTE — ED Triage Notes (Signed)
Pt from home with complaints of sharp generalized abdominal pain that has been present for weeks. Pt states she also has "twitching without pain" in her right arm. Pt has complaints of central chest pain without radiation that began yesterday Pt has complaints of fatigue x 1 week Pt has complaints of pain in left ear x "a while" but got worse today.

## 2016-12-08 LAB — URINALYSIS, ROUTINE W REFLEX MICROSCOPIC
BILIRUBIN URINE: NEGATIVE
Glucose, UA: NEGATIVE mg/dL
KETONES UR: NEGATIVE mg/dL
LEUKOCYTES UA: NEGATIVE
NITRITE: NEGATIVE
PH: 7 (ref 5.0–8.0)
PROTEIN: NEGATIVE mg/dL
Specific Gravity, Urine: 1.009 (ref 1.005–1.030)

## 2016-12-08 LAB — PREGNANCY, URINE: Preg Test, Ur: NEGATIVE

## 2017-01-15 ENCOUNTER — Emergency Department (HOSPITAL_BASED_OUTPATIENT_CLINIC_OR_DEPARTMENT_OTHER): Payer: Medicaid Other

## 2017-01-15 ENCOUNTER — Encounter (HOSPITAL_BASED_OUTPATIENT_CLINIC_OR_DEPARTMENT_OTHER): Payer: Self-pay | Admitting: Emergency Medicine

## 2017-01-15 ENCOUNTER — Emergency Department (HOSPITAL_BASED_OUTPATIENT_CLINIC_OR_DEPARTMENT_OTHER)
Admission: EM | Admit: 2017-01-15 | Discharge: 2017-01-15 | Disposition: A | Discharge status: Home / Self Care | Payer: Medicaid Other | Attending: Emergency Medicine | Admitting: Emergency Medicine

## 2017-01-15 DIAGNOSIS — R079 Chest pain, unspecified: Secondary | ICD-10-CM | POA: Diagnosis not present

## 2017-01-15 DIAGNOSIS — Z87891 Personal history of nicotine dependence: Secondary | ICD-10-CM | POA: Diagnosis not present

## 2017-01-15 DIAGNOSIS — E876 Hypokalemia: Secondary | ICD-10-CM

## 2017-01-15 DIAGNOSIS — Z79899 Other long term (current) drug therapy: Secondary | ICD-10-CM | POA: Diagnosis not present

## 2017-01-15 DIAGNOSIS — I10 Essential (primary) hypertension: Secondary | ICD-10-CM | POA: Diagnosis not present

## 2017-01-15 LAB — CBC
HEMATOCRIT: 38 % (ref 36.0–46.0)
Hemoglobin: 12.6 g/dL (ref 12.0–15.0)
MCH: 29.2 pg (ref 26.0–34.0)
MCHC: 33.2 g/dL (ref 30.0–36.0)
MCV: 88.2 fL (ref 78.0–100.0)
Platelets: 261 10*3/uL (ref 150–400)
RBC: 4.31 MIL/uL (ref 3.87–5.11)
RDW: 13.1 % (ref 11.5–15.5)
WBC: 6.6 10*3/uL (ref 4.0–10.5)

## 2017-01-15 LAB — BASIC METABOLIC PANEL
Anion gap: 10 (ref 5–15)
BUN: 5 mg/dL — AB (ref 6–20)
CHLORIDE: 104 mmol/L (ref 101–111)
CO2: 25 mmol/L (ref 22–32)
Calcium: 8.7 mg/dL — ABNORMAL LOW (ref 8.9–10.3)
Creatinine, Ser: 0.73 mg/dL (ref 0.44–1.00)
GFR calc Af Amer: >60 mL/min (ref >60)
GFR calc non Af Amer: >60 mL/min (ref >60)
Glucose, Bld: 113 mg/dL — ABNORMAL HIGH (ref 65–99)
POTASSIUM: 2.9 mmol/L — AB (ref 3.5–5.1)
SODIUM: 139 mmol/L (ref 135–145)

## 2017-01-15 LAB — TROPONIN I: Troponin I: <0.03 ng/mL (ref <0.03)

## 2017-01-15 NOTE — ED Notes (Signed)
Pt has CP to upper L chest that is described as "a fist in my chest." Pt states it is worse by deep inspiration. Pain is not reproducible by palpation. Pain is non-radiating.

## 2017-01-15 NOTE — ED Triage Notes (Addendum)
Chest pain, radiating into her back all day today with associated light headedness. Pt also reports 3 episodes of diarrhea. Pt is currently on amoxicillin for an ear infection.

## 2017-01-15 NOTE — ED Provider Notes (Signed)
[facility] DEPT [facility] Provider Note   CSN: [id number] Arrival date & time: 01/15/17  1744     History   Chief Complaint Chief Complaint  Patient presents with  . Chest Pain    HPI Christina Fox is a 36 y.o. female.  HPI Patient presents with chest pain. Is in her left upper chest and goes to the back. Began yesterday. Has had a little bit of lightheadedness. Has had some diarrhea. Seen a primary care doctor. States she was told to come in here for get worse. States just gotten worse. No fevers. No shortness of breath. No abdominal pain. She has no swelling or legs. She is on the Mirena. No recent travel. Has been on amoxicillin for an ear infection. Pain is not changed by position. States it does go to the back little bit.   Past Medical History:  Diagnosis Date  . Abdominal pain    "right lower quadrant pain and right lower back pain  . Anxiety   . Deviated septum 09/2011  . GERD (gastroesophageal reflux disease)   . Headache(784.0)    sinus  . History of echocardiogram    Echo 11/17: EF 55-60, no RWMA, normal diastolic function  . Hypertension    no meds  . Kidney stones    no current problem  . Migraine   . Nasal turbinate hypertrophy 09/2011   bilat.  . Pelvic inflammatory disease   . Urticaria     Patient Active Problem List   Diagnosis Date Noted  . Gallstone 01/07/2016  . History of colonic polyps 11/27/2015  . Right lower quadrant abdominal pain 11/27/2015  . HTN (hypertension), benign 11/24/2015  . Abdominal pain 11/16/2015  . Headache 10/13/2015  . 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 Anxiety disorder 11/03/2014    Past Surgical History:  Procedure Laterality Date  . CHOLECYSTECTOMY N/A 01/15/2016   Procedure: LAPAROSCOPIC CHOLECYSTECTOMY WITH INTRAOPERATIVE CHOLANGIOGRAM;  Surgeon: Christene Lye, MD;  Location: [facility];  Service: General;  Laterality: N/A;  . COLONOSCOPY  2007   [facility]: pedunculated 36mm polyp in distal sigmoid, sessile 23mm  polyp at splenic flexure, larger polyp tubulovillous adenoma and smaller polyp tubular adenoma   . COLONOSCOPY  09-09-15  . COLONOSCOPY WITH PROPOFOL N/A 03/01/2016   Procedure: COLONOSCOPY WITH PROPOFOL;  Surgeon: Garlan Fair, MD;  Location: [facility];  Service: Endoscopy;  Laterality: N/A;  . NASAL SEPTOPLASTY W/ TURBINOPLASTY  10/04/2011   Procedure: NASAL SEPTOPLASTY WITH TURBINATE REDUCTION;  Surgeon: Ascencion Dike, MD;  Location: [facility];  Service: ENT;  Laterality: Bilateral;  . WISDOM TOOTH EXTRACTION  2009    OB History    Gravida Para Term Preterm AB Living   4 4 4     4    SAB TAB Ectopic Multiple Live Births           4      Obstetric Comments   1st Menstrual Cycle:  10  1st Pregnancy:  16        Home Medications    Prior to Admission medications   Medication Sig Start Date End Date Taking? Authorizing Provider  amitriptyline (ELAVIL) 10 MG tablet Take 10 mg by mouth at bedtime.   Yes [provider]  cetirizine (ZYRTEC) 10 MG tablet Take 10 mg by mouth daily.   Yes [provider]  clonazePAM (KLONOPIN) 0.5 MG tablet Take 0.25 mg by mouth daily.    Yes [provider]  levonorgestrel-ethinyl estradiol (AUBRA) 0.1-20  MG-MCG tablet Take 1 tablet by mouth daily. 10/06/16  Yes Shelly Bombard, MD  montelukast (SINGULAIR) 10 MG tablet Take 10 mg by mouth at bedtime.   Yes [provider]  omeprazole (PRILOSEC) 40 MG capsule Take 40 mg by mouth daily.   Yes [provider]  potassium chloride SA (K-DUR,KLOR-CON) 20 MEQ tablet Take by mouth. 10/31/16  Yes [provider]  acetaminophen (TYLENOL) 500 MG tablet Take 1 tablet (500 mg total) by mouth every 6 (six) hours as needed. Patient taking differently: Take 1,000 mg by mouth every 8 (eight) hours as needed.  08/31/16   Waynetta Pean, PA-C  fluticasone (FLONASE) 50 MCG/ACT nasal spray Place 2 sprays into both nostrils daily as needed for allergies or  rhinitis.    [provider]  Multiple Vitamin (MULTIVITAMIN) tablet Take 1 tablet by mouth daily.    [provider]  Multiple Vitamins-Minerals (MULTIVITAMIN PO) Take 1 tablet by mouth every morning.    [provider]  Olopatadine HCl (PATANASE) 0.6 % SOLN Place 2 drops (2 puffs total) into both nostrils 2 (two) times daily. 11/03/16   Kennith Gain, MD    Family History Family History  Problem Relation Age of Onset  . Kidney cancer Mother   . Hypertension Mother   . Migraines Mother   . Multiple sclerosis Mother   . Fibromyalgia Mother   . Liver disease Mother        NASH   . Hypertension Father   . Liver disease Father        liver transplant; had fatty liver disease  . Spina bifida Brother   . Migraines Maternal Grandmother   . Hypertension Other   . Colon cancer Paternal Grandfather   . Allergic rhinitis Daughter   . Asthma Daughter   . Allergic rhinitis Son   . Asthma Son   . Angioedema Neg Hx   . Eczema Neg Hx   . Immunodeficiency Neg Hx   . Urticaria Neg Hx     Social History Social History  Substance Use Topics  . Smoking status: Former Smoker    Packs/day: 0.25    Years: 10.00    Quit date: 08/23/2008  . Smokeless tobacco: Never Used  . Alcohol use 0.0 oz/week     Comment: ocassionally     Allergies   Clarithromycin; Doxycycline; Bentyl [dicyclomine hcl]; Haldol [haloperidol]; Meclizine; Morphine and related; Toradol [ketorolac tromethamine]; Macrobid [nitrofurantoin monohyd macro]; and Sulfa antibiotics   Review of Systems Review of Systems  Constitutional: Negative for activity change.  HENT: Negative for congestion.   Respiratory: Negative for shortness of breath.   Cardiovascular: Positive for chest pain.  Gastrointestinal: Positive for diarrhea.  Genitourinary: Negative for dysuria.  Musculoskeletal: Negative for back pain.  Neurological: Negative for numbness.  Hematological: Negative for adenopathy.    Psychiatric/Behavioral: Negative for confusion.     Physical Exam Updated Vital Signs BP (!) 141/91 (BP Location: Left Arm)   Pulse 76   Temp 98.1 F (36.7 C) (Oral)   Resp 15   SpO2 99%   Physical Exam  Constitutional: She appears well-developed.  HENT:  Head: Atraumatic.  Eyes: Pupils are equal, round, and reactive to light.  Neck: Neck supple.  Cardiovascular: Normal rate.   Pulmonary/Chest: Effort normal. She exhibits tenderness.  Mild tenderness to left anterior upper chest wall.  Abdominal: Soft. There is no tenderness.  Musculoskeletal: She exhibits no edema or tenderness.  Neurological: She is alert.  Skin: Skin  is warm. Capillary refill takes less than 2 seconds.     ED Treatments / Results  Labs (all labs ordered are listed, but only abnormal results are displayed) Labs Reviewed  BASIC METABOLIC PANEL - Abnormal; Notable for the following:       Result Value   Potassium 2.9 (*)    Glucose, Bld 113 (*)    BUN 5 (*)    Calcium 8.7 (*)    All other components within normal limits  CBC  TROPONIN I    EKG  EKG Interpretation  Date/Time:  Sunday January 15 2017 17:51:08 EDT Ventricular Rate:  81 PR Interval:  130 QRS Duration: 86 QT Interval:  364 QTC Calculation: 422 R Axis:   90 Text Interpretation:  Normal sinus rhythm Rightward axis Nonspecific ST and T wave abnormality Abnormal ECG Confirmed by Davonna Belling [phone]) on 01/15/2017 7:18:18 PM       Radiology No results found.  Procedures Procedures (including critical care time)  Medications Ordered in ED Medications - No data to display   Initial Impression / Assessment and Plan / ED Course  I have reviewed the triage vital signs and the nursing notes.  Pertinent labs & imaging results that were available during my care of the patient were reviewed by me and considered in my medical decision making (see chart for details).     Patient with chest pain. EKG and cardiac enzymes  reassuring. Reviewed x-ray from earlier today. No fever. Not tachycardic. Doubt pulmonary embolism. Somewhat reproducible. May be chest wall pain. Doubt pericarditis. Will discharge home.   Final Clinical Impressions(s) / ED Diagnoses   Final diagnoses:  Nonspecific chest pain  Hypokalemia    New Prescriptions New Prescriptions   No medications on file     Davonna Belling, MD 01/15/17 2054

## 2017-01-15 NOTE — Discharge Instructions (Signed)
Double your  potassium for the next 4 days.

## 2017-01-20 ENCOUNTER — Encounter (HOSPITAL_BASED_OUTPATIENT_CLINIC_OR_DEPARTMENT_OTHER): Payer: Self-pay | Admitting: *Deleted

## 2017-01-20 ENCOUNTER — Emergency Department (HOSPITAL_BASED_OUTPATIENT_CLINIC_OR_DEPARTMENT_OTHER): Payer: Medicaid Other

## 2017-01-20 ENCOUNTER — Emergency Department (HOSPITAL_BASED_OUTPATIENT_CLINIC_OR_DEPARTMENT_OTHER)
Admission: EM | Admit: 2017-01-20 | Discharge: 2017-01-20 | Disposition: A | Discharge status: Home / Self Care | Payer: Medicaid Other | Attending: Emergency Medicine | Admitting: Emergency Medicine

## 2017-01-20 DIAGNOSIS — Z79899 Other long term (current) drug therapy: Secondary | ICD-10-CM | POA: Insufficient documentation

## 2017-01-20 DIAGNOSIS — R42 Dizziness and giddiness: Secondary | ICD-10-CM | POA: Diagnosis not present

## 2017-01-20 DIAGNOSIS — H9202 Otalgia, left ear: Secondary | ICD-10-CM | POA: Diagnosis present

## 2017-01-20 DIAGNOSIS — Z87891 Personal history of nicotine dependence: Secondary | ICD-10-CM | POA: Insufficient documentation

## 2017-01-20 DIAGNOSIS — I10 Essential (primary) hypertension: Secondary | ICD-10-CM | POA: Diagnosis not present

## 2017-01-20 LAB — CBC WITH DIFFERENTIAL/PLATELET
BASOS PCT: 1 %
Basophils Absolute: 0 10*3/uL (ref 0.0–0.1)
Eosinophils Absolute: 0.2 10*3/uL (ref 0.0–0.7)
Eosinophils Relative: 3 %
HEMATOCRIT: 39.7 % (ref 36.0–46.0)
Hemoglobin: 13.4 g/dL (ref 12.0–15.0)
LYMPHS PCT: 21 %
Lymphs Abs: 1.6 10*3/uL (ref 0.7–4.0)
MCH: 29.5 pg (ref 26.0–34.0)
MCHC: 33.8 g/dL (ref 30.0–36.0)
MCV: 87.3 fL (ref 78.0–100.0)
Monocytes Absolute: 0.6 10*3/uL (ref 0.1–1.0)
Monocytes Relative: 9 %
NEUTROS ABS: 4.9 10*3/uL (ref 1.7–7.7)
NEUTROS PCT: 66 %
PLATELETS: 319 10*3/uL (ref 150–400)
RBC: 4.55 MIL/uL (ref 3.87–5.11)
RDW: 13.2 % (ref 11.5–15.5)
WBC: 7.4 10*3/uL (ref 4.0–10.5)

## 2017-01-20 LAB — BASIC METABOLIC PANEL
Anion gap: 11 (ref 5–15)
BUN: 8 mg/dL (ref 6–20)
CALCIUM: 9.1 mg/dL (ref 8.9–10.3)
CO2: 26 mmol/L (ref 22–32)
CREATININE: 0.78 mg/dL (ref 0.44–1.00)
Chloride: 101 mmol/L (ref 101–111)
GFR calc non Af Amer: >60 mL/min (ref >60)
Glucose, Bld: 90 mg/dL (ref 65–99)
Potassium: 3.6 mmol/L (ref 3.5–5.1)
Sodium: 138 mmol/L (ref 135–145)

## 2017-01-20 LAB — PREGNANCY, URINE: PREG TEST UR: NEGATIVE

## 2017-01-20 NOTE — ED Provider Notes (Signed)
[facility] Provider Note   CSN: [id number] Arrival date & time: 01/20/17  1700     History   Chief Complaint Chief Complaint  Patient presents with  . Otalgia    HPI Christina Fox is a 36 y.o. female who presents to the ED for intermittent left ear pain that has been ongoing for 2 weeks. Patient states that when symptoms initially started she was seen at urgent care and diagnosed with both otitis media and otitis externa. Patient was started on a 10 day course of Augmentin and Ciprodex, which she states she has complete. Patient reports that in the last 3 days symptoms have symmetrically worsen. Patient reports that she is still having left ear pain that radiates to the back part of the ear. Patient also reports intermittent tinnitus, chills and dizziness. Patient denies any acute hearing loss and left-sided ear but "feels like sometimes it foggy." Patient states that symptoms have prevented her from completing her daily functions. Patient has been able to ambulate. Patient called her primary care doctor who prompted her ED visit for further evaluation. Patient states that she has been having pain to the left side of her face that radiates up into the left side of her head. Patient states that she has been very anxious about symptoms and is worried that she has an infection in her brain. Patient states that she has been taking Tylenol for the symptoms with minimal relief. Patient denies any fever, sore throat, nasal congestion, rhinorrhea, difficulty breathing, abdominal pain, vomiting.  The history is provided by the patient.    Past Medical History:  Diagnosis Date  . Abdominal pain    "right lower quadrant pain and right lower back pain  . Anxiety   . Deviated septum 09/2011  . GERD (gastroesophageal reflux disease)   . Headache(784.0)    sinus  . History of echocardiogram    Echo 11/17: EF 55-60, no RWMA, normal diastolic function  . Hypertension    no meds  .  Kidney stones    no current problem  . Migraine   . Nasal turbinate hypertrophy 09/2011   bilat.  . Pelvic inflammatory disease   . Urticaria     Patient Active Problem List   Diagnosis Date Noted  . Gallstone 01/07/2016  . History of colonic polyps 11/27/2015  . Right lower quadrant abdominal pain 11/27/2015  . HTN (hypertension), benign 11/24/2015  . Abdominal pain 11/16/2015  . Headache 10/13/2015  . 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 Anxiety disorder 11/03/2014    Past Surgical History:  Procedure Laterality Date  . CHOLECYSTECTOMY N/A 01/15/2016   Procedure: LAPAROSCOPIC CHOLECYSTECTOMY WITH INTRAOPERATIVE CHOLANGIOGRAM;  Surgeon: Christene Lye, MD;  Location: [facility] ORS;  Service: General;  Laterality: N/A;  . COLONOSCOPY  2007   [facility]: pedunculated 29mm polyp in distal sigmoid, sessile 65mm polyp at splenic flexure, larger polyp tubulovillous adenoma and smaller polyp tubular adenoma   . COLONOSCOPY  09-09-15  . COLONOSCOPY WITH PROPOFOL N/A 03/01/2016   Procedure: COLONOSCOPY WITH PROPOFOL;  Surgeon: Garlan Fair, MD;  Location: [facility];  Service: Endoscopy;  Laterality: N/A;  . NASAL SEPTOPLASTY W/ TURBINOPLASTY  10/04/2011   Procedure: NASAL SEPTOPLASTY WITH TURBINATE REDUCTION;  Surgeon: Ascencion Dike, MD;  Location: [place];  Service: ENT;  Laterality: Bilateral;  . WISDOM TOOTH EXTRACTION  2009    OB History    Gravida Para Term Preterm AB Living   4 4 4  4   SAB TAB Ectopic Multiple Live Births           4      Obstetric Comments   1st Menstrual Cycle:  10  1st Pregnancy:  16        Home Medications    Prior to Admission medications   Medication Sig Start Date End Date Taking? Authorizing Provider  acetaminophen (TYLENOL) 500 MG tablet Take 1 tablet (500 mg total) by mouth every 6 (six) hours as needed. Patient taking differently: Take 1,000 mg by mouth every 8 (eight) hours as needed.  08/31/16   Waynetta Pean, PA-C  cetirizine  (ZYRTEC) 10 MG tablet Take 10 mg by mouth daily.    [provider]  clonazePAM (KLONOPIN) 0.5 MG tablet Take 0.25 mg by mouth daily.     [provider]  fluticasone (FLONASE) 50 MCG/ACT nasal spray Place 2 sprays into both nostrils daily as needed for allergies or rhinitis.    [provider]  levonorgestrel-ethinyl estradiol (AUBRA) 0.1-20 MG-MCG tablet Take 1 tablet by mouth daily. 10/06/16   Shelly Bombard, MD  montelukast (SINGULAIR) 10 MG tablet Take 10 mg by mouth at bedtime.    [provider]  Multiple Vitamin (MULTIVITAMIN) tablet Take 1 tablet by mouth daily.    [provider]  Multiple Vitamins-Minerals (MULTIVITAMIN PO) Take 1 tablet by mouth every morning.    [provider]  Olopatadine HCl (PATANASE) 0.6 % SOLN Place 2 drops (2 puffs total) into both nostrils 2 (two) times daily. 11/03/16   Kennith Gain, MD  omeprazole (PRILOSEC) 40 MG capsule Take 40 mg by mouth daily.    [provider]  potassium chloride SA (K-DUR,KLOR-CON) 20 MEQ tablet Take by mouth. 10/31/16   [provider]    Family History Family History  Problem Relation Age of Onset  . Kidney cancer Mother   . Hypertension Mother   . Migraines Mother   . Multiple sclerosis Mother   . Fibromyalgia Mother   . Liver disease Mother        NASH   . Hypertension Father   . Liver disease Father        liver transplant; had fatty liver disease  . Spina bifida Brother   . Migraines Maternal Grandmother   . Hypertension Other   . Colon cancer Paternal Grandfather   . Allergic rhinitis Daughter   . Asthma Daughter   . Allergic rhinitis Son   . Asthma Son   . Angioedema Neg Hx   . Eczema Neg Hx   . Immunodeficiency Neg Hx   . Urticaria Neg Hx     Social History Social History  Substance Use Topics  . Smoking status: Former Smoker    Packs/day: 0.25    Years: 10.00    Quit date: 08/23/2008  . Smokeless tobacco: Never Used   . Alcohol use 0.0 oz/week     Comment: ocassionally     Allergies   Clarithromycin; Doxycycline; Bentyl [dicyclomine hcl]; Haldol [haloperidol]; Meclizine; Morphine and related; Toradol [ketorolac tromethamine]; Macrobid [nitrofurantoin monohyd macro]; and Sulfa antibiotics   Review of Systems Review of Systems  Constitutional: Positive for chills. Negative for fever.  HENT: Positive for ear pain and tinnitus. Negative for congestion and sore throat.   Respiratory: Negative for shortness of breath.   Gastrointestinal: Negative for abdominal pain and vomiting.  Neurological: Positive for dizziness and headaches.     Physical Exam Updated Vital Signs BP (!) 148/93 (  BP Location: Left Arm)   Pulse 95   Temp 99.3 F (37.4 C) (Oral)   Resp 18   Ht 5\' 2"  (1.575 m)   Wt 73.8 kg (162 lb 11.2 oz)   LMP 01/09/2017 (Exact Date)   SpO2 100%   BMI 29.76 kg/m   Physical Exam  Constitutional: She is oriented to person, place, and time. She appears well-developed and well-nourished.  Very anxious appearing but no acute distress  HENT:  Head: Normocephalic and atraumatic.  Right Ear: Tympanic membrane normal. No mastoid tenderness. Tympanic membrane is not injected and not bulging. No hemotympanum.  Left Ear: There is mastoid tenderness. Tympanic membrane is injected. Tympanic membrane is not erythematous and not bulging. No hemotympanum.  Mouth/Throat: Uvula is midline, oropharynx is clear and moist and mucous membranes are normal.  No tenderness to palpation of skull. No deformities or crepitus noted. No open wounds, abrasions or lacerations. Tenderness palpation to the left mastoid. No overlying erythema, warmth, edema, ecchymosis. No deformity or crepitus noted. Left ear is in normal position. The left pinna of the ear is not pushed forward. No pain with movement of the tragus bilaterally.  Eyes: Pupils are equal, round, and reactive to light. Conjunctivae, EOM and lids are normal.  Right eye exhibits no nystagmus. Left eye exhibits no nystagmus.  EOMs intact without difficulty. No vertical, rotational, horizontal nystagmus.  Neck: Full passive range of motion without pain. Neck supple. No neck rigidity.  Full flexion/extension and lateral movement of neck fully intact. No bony midline tenderness. No deformities or crepitus. No meningismal signs.   Cardiovascular: Normal rate, regular rhythm, normal heart sounds and normal pulses.  Exam reveals no gallop and no friction rub.   No murmur heard. Pulmonary/Chest: Effort normal and breath sounds normal.  Musculoskeletal: Normal range of motion.  Neurological: She is alert and oriented to person, place, and time. GCS eye subscore is 4. GCS verbal subscore is 5. GCS motor subscore is 6.  Cranial nerves III-XII intact Follows commands, Moves all extremities  5/5 strength to BUE and BLE  Sensation intact throughout  Normal finger to nose. No dysdiadochokinesia. No pronator drift. No gait abnormalities  No slurred speech. No facial droop.   Skin: Skin is warm and dry. Capillary refill takes less than 2 seconds.  Psychiatric: She has a normal mood and affect. Her speech is normal.  Nursing note and vitals reviewed.    ED Treatments / Results  Labs (all labs ordered are listed, but only abnormal results are displayed) Labs Reviewed  PREGNANCY, URINE  CBC WITH DIFFERENTIAL/PLATELET  BASIC METABOLIC PANEL    EKG  EKG Interpretation None       Radiology Ct Head Wo Contrast  Result Date: 01/20/2017 CLINICAL DATA:  Left side earache/pain x 2 weeks, pain travels to occipital lobe of skull, no injury, h/o migraines and deviated septum. EXAM: CT HEAD WITHOUT CONTRAST CT MAXILLOFACIAL WITHOUT CONTRAST TECHNIQUE: Multidetector CT imaging of the head and maxillofacial structures were performed using the standard protocol without intravenous contrast. Multiplanar CT image reconstructions of the maxillofacial structures were  also generated. COMPARISON:  CT head 06/02/2016.  MRI brain 10/15/2015. FINDINGS: CT HEAD FINDINGS Brain: No evidence of acute infarction, hemorrhage, hydrocephalus, extra-axial collection or mass lesion/mass effect. Vascular: No hyperdense vessel or unexpected calcification. Skull: Normal. Negative for fracture or focal lesion. Other: No significant change since previous study. CT MAXILLOFACIAL FINDINGS Osseous: No fracture or mandibular dislocation. No destructive process. Orbits: The globes and extraocular muscles are  intact and symmetrical. Sinuses: Minimal mucosal thickening in the right maxillary antrum. Paranasal sinuses are otherwise clear. No acute air-fluid levels are demonstrated. Mastoid air cells are clear without effusion. Soft tissues: No mass, collection, or infiltrative process identified. Scattered lymph nodes are not pathologically enlarged. IMPRESSION: 1. No acute intracranial abnormalities. 2. Minimal focal mucosal thickening in the right maxillary antrum. Paranasal sinuses are otherwise clear. No acute displaced facial bone fractures. Electronically Signed   By: Lucienne Capers M.D.   On: 01/20/2017 18:30   Ct Maxillofacial Wo Cm  Result Date: 01/20/2017 CLINICAL DATA:  Left side earache/pain x 2 weeks, pain travels to occipital lobe of skull, no injury, h/o migraines and deviated septum. EXAM: CT HEAD WITHOUT CONTRAST CT MAXILLOFACIAL WITHOUT CONTRAST TECHNIQUE: Multidetector CT imaging of the head and maxillofacial structures were performed using the standard protocol without intravenous contrast. Multiplanar CT image reconstructions of the maxillofacial structures were also generated. COMPARISON:  CT head 06/02/2016.  MRI brain 10/15/2015. FINDINGS: CT HEAD FINDINGS Brain: No evidence of acute infarction, hemorrhage, hydrocephalus, extra-axial collection or mass lesion/mass effect. Vascular: No hyperdense vessel or unexpected calcification. Skull: Normal. Negative for fracture or focal  lesion. Other: No significant change since previous study. CT MAXILLOFACIAL FINDINGS Osseous: No fracture or mandibular dislocation. No destructive process. Orbits: The globes and extraocular muscles are intact and symmetrical. Sinuses: Minimal mucosal thickening in the right maxillary antrum. Paranasal sinuses are otherwise clear. No acute air-fluid levels are demonstrated. Mastoid air cells are clear without effusion. Soft tissues: No mass, collection, or infiltrative process identified. Scattered lymph nodes are not pathologically enlarged. IMPRESSION: 1. No acute intracranial abnormalities. 2. Minimal focal mucosal thickening in the right maxillary antrum. Paranasal sinuses are otherwise clear. No acute displaced facial bone fractures. Electronically Signed   By: Lucienne Capers M.D.   On: 01/20/2017 18:30    Procedures Procedures (including critical care time)  Medications Ordered in ED Medications - No data to display   Initial Impression / Assessment and Plan / ED Course  I have reviewed the triage vital signs and the nursing notes.  Pertinent labs & imaging results that were available during my care of the patient were reviewed by me and considered in my medical decision making (see chart for details).     36 year old female resents with 2 weeks of intermittent worsening left ear pain. Worsened in last 3 days. Patient is afebrile, non-toxic appearing, sitting comfortably on examination table. Vital signs reviewed and stable. No neuro deficits noted on exam. Consider Meniere's vs vertigo vs AOM. Also consider mastoiditis, though low suspicion given lack of clinical findings. History/physical exam are not concerning for meningitis. Plan to check basic labs including CBC, BMP, Urine Preg and CT head and Maxillofacial.   Labs and imaging reviewed. CBC without any elevation of white blood cell count. BMP unremarkable. Urine pregnancy is negative. CT head is without any acute abnormality. CT  maxillofacial shows mastoid cells are clear. No evidence of mastoiditis.  Concern that symptoms may be a result of Meniere's given tinnitus, hearing loss and tinnitus. Discussed patient with Dr. Tomi Bamberger.  Discussed results with patient.  Plan to treat symptomatically. Plan for ENT referral for further follow-up. Instructed patient to follow-up with PCP in 2 days. Return precautions discussed. Patient expresses understanding and agreement to plan.     Final Clinical Impressions(s) / ED Diagnoses   Final diagnoses:  Left ear pain    New Prescriptions Discharge Medication List as of 01/20/2017  7:12 PM  Volanda Napoleon, PA-C 01/21/17 1041    Dorie Rank, MD 01/24/17 [phone]

## 2017-01-20 NOTE — Discharge Instructions (Signed)
You can take Tylenol or Ibuprofen as directed for pain. You can try alternating the two for further symptom relief.   Follow-up with your primary care doctor in 24-48 hours for further evaluation.   Follow-up with the referred ENT doctor for further evaluation.  As we discussed, there may be some component of meniere's disease to your symptoms. I have provided some information for you.   Return to the Emergency Department for any worsening pain, fever, dizziness, difficulty walking, vomiting, swelling of your face or back scalp, drainage from the ear or any other worsening or concerning symptoms.

## 2017-01-20 NOTE — ED Triage Notes (Signed)
Pt reports hx of left ear pain x 2 weeks. Reports that the pain is worsening. States that her PCP sent her to the ED for a CT scan.

## 2017-01-31 ENCOUNTER — Telehealth: Payer: Self-pay | Admitting: Cardiology

## 2017-01-31 NOTE — Telephone Encounter (Signed)
I spoke to the patient.  She feels fine currently.  Earlier today she developed chest pain and SOB and sweating which lasted approx 5 min.  She took clonazePam when it started.  She reports chest pain daily, sometimes more than once a day and sometimes it lasts for hours.  She is unable to reproduce the pain by pressing on the areas.  At those times, the anxiety medication doesn't help.  It is not always accompanied by SOB/sweating.  Sometimes her upper arm or elbow hurts as well.  Pt has had normal echo and low risk myoview in the past year.  She has been to the ED twice recently for this and was instructed to follow up with cardiology.    Today she made an appointment with Dr. Curt Bears for 02/28/17.  Pt is aware that I am forwarding to Dr. Curt Bears to inform of our phone call, and we will call her back if there are any new recommendations.  Advised her to call back with further concerns.

## 2017-01-31 NOTE — Telephone Encounter (Signed)
New message    Pt C/O of Chest Pain: STAT if CP now or developed within 24 hours  1. Are you having CP right now? No - about 45 min up stairs weird chest pain on left side  2. Are you experiencing any other symptoms (ex. SOB, nausea, vomiting, sweating)? Sweating about 45 min took shower - has not been taken her anxiety medication every day   3. How long have you been experiencing CP? Couple of week    4. Is your CP continuous or coming and going? coming and going   5. Have you taken Nitroglycerin? No never been prescribe ?

## 2017-02-02 NOTE — Telephone Encounter (Signed)
Patient has had normal stress testing. Likely noncardiac pain. Can follow up in clinic with me or general APP if pain persists.

## 2017-02-02 NOTE — Telephone Encounter (Signed)
Left detailed message (DPR) that Dr. Curt Bears replied to the message I sent him.  Advised that she has had normal stress testing and that he feels this is likely noncardiac pain.  Advised of her appointment on 02/28/17 and to call back if needs sooner appointment or has questions.

## 2017-02-07 ENCOUNTER — Other Ambulatory Visit: Payer: Self-pay | Admitting: Otolaryngology

## 2017-02-07 DIAGNOSIS — E041 Nontoxic single thyroid nodule: Secondary | ICD-10-CM

## 2017-02-09 ENCOUNTER — Ambulatory Visit
Admission: RE | Admit: 2017-02-09 | Discharge: 2017-02-09 | Disposition: A | Discharge status: Home / Self Care | Payer: Medicaid Other | Source: Ambulatory Visit | Attending: Otolaryngology | Admitting: Otolaryngology

## 2017-02-09 DIAGNOSIS — E041 Nontoxic single thyroid nodule: Secondary | ICD-10-CM

## 2017-02-20 ENCOUNTER — Telehealth: Payer: Self-pay

## 2017-02-20 NOTE — Telephone Encounter (Signed)
Returned call, left vm.

## 2017-02-21 ENCOUNTER — Telehealth: Payer: Self-pay

## 2017-02-21 NOTE — Telephone Encounter (Signed)
Returned call and advised pt that per provider order, I spoke to pharmacy to change instructions for [facility] rx so that she could skip last row of pills to avoid her cycle. Pt stated that she also wanted to schedule an appt to possibly change BC because she thinks that it may be causing her hair to fall out/thin. Advised that a scheduler would call her back.

## 2017-02-22 ENCOUNTER — Other Ambulatory Visit: Payer: Self-pay | Admitting: Obstetrics

## 2017-02-28 ENCOUNTER — Ambulatory Visit: Payer: Self-pay | Admitting: Cardiology

## 2017-02-28 NOTE — Progress Notes (Deleted)
Cardiology Office Note   Date:  02/28/2017   ID:  Christina Fox, DOB 1980-12-31, MRN [id number]  PCP:  Darden Amber, PA  Cardiologist:  Constance Haw, MD    No chief complaint on file.    History of Present Illness: Christina Fox is a 36 y.o. female who presents today for cardiology evaluation.   She has a history of HTN and migraines.  She presented to the ER on 6/4 with dizziness and gait instability but no falls.  She was diagnosed with vertigo as she recently had sinus symptoms and was thought due to vestibular neuritis.  She also says that she has been having episodes of chest pain. The pain is located mainly in the center of her chest but also towards the left side. The pain occurs, at times, when she exerts herself but also can occur when she is at rest. She says it lasts for minutes at a time. She went to the urgent care yesterday with chest discomfort and was noted to have ST depressions in the lateral leads on her EKG and was sent to the emergency room. In the emergency room she had a negative troponin 2.  Today, denies symptoms of palpitations, chest pain, shortness of breath, orthopnea, PND, lower extremity edema, claudication, dizziness, presyncope, syncope, bleeding, or neurologic sequela. The patient is tolerating medications without difficulties and is otherwise without complaint today. ***    Past Medical History:  Diagnosis Date  . Abdominal pain    "right lower quadrant pain and right lower back pain  . Anxiety   . Deviated septum 09/2011  . GERD (gastroesophageal reflux disease)   . Headache(784.0)    sinus  . History of echocardiogram    Echo 11/17: EF 55-60, no RWMA, normal diastolic function  . Hypertension    no meds  . Kidney stones    no current problem  . Migraine   . Nasal turbinate hypertrophy 09/2011   bilat.  . Pelvic inflammatory disease   . Urticaria    Past Surgical History:  Procedure Laterality Date  . CHOLECYSTECTOMY N/A  01/15/2016   Procedure: LAPAROSCOPIC CHOLECYSTECTOMY WITH INTRAOPERATIVE CHOLANGIOGRAM;  Surgeon: Christene Lye, MD;  Location: [facility];  Service: General;  Laterality: N/A;  . COLONOSCOPY  2007   [facility]: pedunculated 67mm polyp in distal sigmoid, sessile 45mm polyp at splenic flexure, larger polyp tubulovillous adenoma and smaller polyp tubular adenoma   . COLONOSCOPY  09-09-15  . COLONOSCOPY WITH PROPOFOL N/A 03/01/2016   Procedure: COLONOSCOPY WITH PROPOFOL;  Surgeon: Garlan Fair, MD;  Location: [facility];  Service: Endoscopy;  Laterality: N/A;  . NASAL SEPTOPLASTY W/ TURBINOPLASTY  10/04/2011   Procedure: NASAL SEPTOPLASTY WITH TURBINATE REDUCTION;  Surgeon: Ascencion Dike, MD;  Location: [place];  Service: ENT;  Laterality: Bilateral;  . WISDOM TOOTH EXTRACTION  2009     Current Outpatient Prescriptions  Medication Sig Dispense Refill  . acetaminophen (TYLENOL) 500 MG tablet Take 1 tablet (500 mg total) by mouth every 6 (six) hours as needed. (Patient taking differently: Take 1,000 mg by mouth every 8 (eight) hours as needed. ) 30 tablet 0  . cetirizine (ZYRTEC) 10 MG tablet Take 10 mg by mouth daily.    . clonazePAM (KLONOPIN) 0.5 MG tablet Take 0.25 mg by mouth daily.     . fluticasone (FLONASE) 50 MCG/ACT nasal spray Place 2 sprays into both nostrils daily as needed for allergies or rhinitis.    Marland Kitchen  ibuprofen (ADVIL,MOTRIN) 800 MG tablet TAKE ONE TABLET BY MOUTH EVERY 8 HOURS 30 tablet 5  . levonorgestrel-ethinyl estradiol (AUBRA) 0.1-20 MG-MCG tablet Take 1 tablet by mouth daily. 3 Package 4  . montelukast (SINGULAIR) 10 MG tablet Take 10 mg by mouth at bedtime.    . Multiple Vitamin (MULTIVITAMIN) tablet Take 1 tablet by mouth daily.    . Multiple Vitamins-Minerals (MULTIVITAMIN PO) Take 1 tablet by mouth every morning.    . Olopatadine HCl (PATANASE) 0.6 % SOLN Place 2 drops (2 puffs total) into both nostrils 2 (two) times daily. 30.5 g 5  . omeprazole  (PRILOSEC) 40 MG capsule Take 40 mg by mouth daily.    . potassium chloride SA (K-DUR,KLOR-CON) 20 MEQ tablet Take by mouth.     No current facility-administered medications for this visit.     Allergies:   Clarithromycin; Doxycycline; Bentyl [dicyclomine hcl]; Haldol [haloperidol]; Meclizine; Morphine and related; Toradol [ketorolac tromethamine]; Macrobid [nitrofurantoin monohyd macro]; and Sulfa antibiotics   Social History:  The patient  reports that she quit smoking about 8 years ago. She has a 2.50 pack-year smoking history. She has never used smokeless tobacco. She reports that she drinks alcohol. She reports that she does not use drugs.   Family History:  The patient's family history includes Allergic rhinitis in her daughter and son; Asthma in her daughter and son; Colon cancer in her paternal grandfather; Fibromyalgia in her mother; Hypertension in her father, mother, and other; Kidney cancer in her mother; Liver disease in her father and mother; Migraines in her maternal grandmother and mother; Multiple sclerosis in her mother; Spina bifida in her brother.    ROS:  Please see the history of present illness.   Otherwise, review of systems is positive for ***.   All other systems are reviewed and negative.   PHYSICAL EXAM: VS:  There were no vitals taken for this visit. , BMI There is no height or weight on file to calculate BMI. GEN: Well nourished, well developed, in no acute distress  HEENT: normal  Neck: no JVD, carotid bruits, or masses Cardiac: ***RRR; no murmurs, rubs, or gallops,no edema  Respiratory:  clear to auscultation bilaterally, normal work of breathing GI: soft, nontender, nondistended, + BS MS: no deformity or atrophy  Skin: warm and dry Neuro:  Strength and sensation are intact Psych: euthymic mood, full affect  EKG:  EKG {ACTION; IS/IS [id number]} ordered today. Personal review of the ekg ordered *** shows ***  Recent Labs: 08/31/2016: ALT 23; TSH  2.110 01/20/2017: BUN 8; Creatinine, Ser 0.78; Hemoglobin 13.4; Platelets 319; Potassium 3.6; Sodium 138    Lipid Panel     Component Value Date/Time   CHOL 153 12/22/2015 1646   TRIG 99 12/22/2015 1646   HDL 59 12/22/2015 1646   CHOLHDL 2.6 12/22/2015 1646   VLDL 20 12/22/2015 1646   LDLCALC 74 12/22/2015 1646     Wt Readings from Last 3 Encounters:  01/20/17 162 lb 11.2 oz (73.8 kg)  12/07/16 174 lb (78.9 kg)  11/06/16 162 lb (73.5 kg)      Other studies Reviewed: Additional studies/ records that were reviewed today include: Myoview 12/14/15  Nuclear stress EF: 59%.  T wave inversion was noted during stress in the II, III, aVF, V4, V5 and V6 leads.  Horizontal ST segment depression ST segment depression of 2 mm was noted during stress in the V4, V5 and V6 leads.  The study is normal.  This is a low risk  study.  The left ventricular ejection fraction is normal (55-65%).  TTE 05/17/16 - Left ventricle: The cavity size was normal. Systolic function was   normal. The estimated ejection fraction was in the range of 55%   to 60%. Wall motion was normal; there were no regional wall   motion abnormalities. Left ventricular diastolic function   parameters were normal.  ASSESSMENT AND PLAN:  1.  Chest pain: At this time her chest pain does have both typical and atypical features. She does not currently have a family history of coronary disease, but her grandmother did have aortic stenosis and has a pacemaker now. Due to the fact that she has some typical features of her chest pain, we'll order a stress Myoview to further determine if she has any evidence of coronary artery disease. We'll have her follow-up as needed pending the results of the study.***    Current medicines are reviewed at length with the patient today.   The patient does not have concerns regarding her medicines.  The following changes were made today:  none  Labs/ tests ordered today include:  No orders  of the defined types were placed in this encounter.    Disposition:   FU with Rosealie Reach ***  Signed, Arben Packman Meredith Leeds, MD  02/28/2017 7:29 AM     [facility] [address] [place] [address] [phone] (office) [phone] (fax)

## 2017-03-01 ENCOUNTER — Encounter: Payer: Self-pay | Admitting: Cardiology

## 2017-03-02 ENCOUNTER — Encounter: Payer: Self-pay | Admitting: Obstetrics

## 2017-03-02 ENCOUNTER — Ambulatory Visit (INDEPENDENT_AMBULATORY_CARE_PROVIDER_SITE_OTHER): Payer: Medicaid Other | Admitting: Obstetrics

## 2017-03-02 VITALS — BP 136/92 | HR 86 | Wt 171.0 lb

## 2017-03-02 DIAGNOSIS — L659 Nonscarring hair loss, unspecified: Secondary | ICD-10-CM | POA: Diagnosis not present

## 2017-03-02 DIAGNOSIS — Z3041 Encounter for surveillance of contraceptive pills: Secondary | ICD-10-CM | POA: Diagnosis not present

## 2017-03-02 DIAGNOSIS — R5382 Chronic fatigue, unspecified: Secondary | ICD-10-CM | POA: Diagnosis not present

## 2017-03-02 MED ORDER — NORGESTIMATE-ETH ESTRADIOL 0.25-35 MG-MCG PO TABS: ORAL_TABLET | ORAL | 11 refills | Status: DC

## 2017-03-02 NOTE — Progress Notes (Signed)
Patient is in the office to discuss changing her birth control. She feels it is thinning her hair.

## 2017-03-03 ENCOUNTER — Encounter: Payer: Self-pay | Admitting: Obstetrics

## 2017-03-03 LAB — TSH+T4F+T3FREE
FREE T4: 1.31 ng/dL (ref 0.82–1.77)
T3, Free: 3.1 pg/mL (ref 2.0–4.4)
TSH: 1.94 u[IU]/mL (ref 0.450–4.500)

## 2017-03-03 LAB — HEMOGLOBIN A1C
Est. average glucose Bld gHb Est-mCnc: 94 mg/dL
Hgb A1c MFr Bld: 4.9 % (ref 4.8–5.6)

## 2017-03-03 NOTE — Progress Notes (Signed)
Subjective:    Christina Fox is a 36 y.o. female who presents for contraception counseling. The patient has no complaints today. The patient is sexually active. Pertinent past medical history: none.  The information documented in the HPI was reviewed and verified.  Menstrual History: OB History    Gravida Para Term Preterm AB Living   4 4 4     4    SAB TAB Ectopic Multiple Live Births           4      Obstetric Comments   1st Menstrual Cycle:  10  1st Pregnancy:  16        Patient's last menstrual period was 02/20/2017.   Patient Active Problem List   Diagnosis Date Noted  . Gallstone 01/07/2016  . History of colonic polyps 11/27/2015  . Right lower quadrant abdominal pain 11/27/2015  . HTN (hypertension), benign 11/24/2015  . Abdominal pain 11/16/2015  . Headache 10/13/2015  . 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 Anxiety disorder 11/03/2014   Past Medical History:  Diagnosis Date  . Abdominal pain    "right lower quadrant pain and right lower back pain  . Anxiety   . Deviated septum 09/2011  . GERD (gastroesophageal reflux disease)   . Headache(784.0)    sinus  . History of echocardiogram    Echo 11/17: EF 55-60, no RWMA, normal diastolic function  . Hypertension    no meds  . Kidney stones    no current problem  . Migraine   . Nasal turbinate hypertrophy 09/2011   bilat.  . Pelvic inflammatory disease   . Urticaria     Past Surgical History:  Procedure Laterality Date  . CHOLECYSTECTOMY N/A 01/15/2016   Procedure: LAPAROSCOPIC CHOLECYSTECTOMY WITH INTRAOPERATIVE CHOLANGIOGRAM;  Surgeon: Christene Lye, MD;  Location: [facility];  Service: General;  Laterality: N/A;  . COLONOSCOPY  2007   [facility]: pedunculated 6mm polyp in distal sigmoid, sessile 28mm polyp at splenic flexure, larger polyp tubulovillous adenoma and smaller polyp tubular adenoma   . COLONOSCOPY  09-09-15  . COLONOSCOPY WITH PROPOFOL N/A 03/01/2016   Procedure: COLONOSCOPY WITH PROPOFOL;   Surgeon: Garlan Fair, MD;  Location: [facility];  Service: Endoscopy;  Laterality: N/A;  . NASAL SEPTOPLASTY W/ TURBINOPLASTY  10/04/2011   Procedure: NASAL SEPTOPLASTY WITH TURBINATE REDUCTION;  Surgeon: Ascencion Dike, MD;  Location: [facility];  Service: ENT;  Laterality: Bilateral;  . WISDOM TOOTH EXTRACTION  2009     Current Outpatient Prescriptions:  .  acetaminophen (TYLENOL) 500 MG tablet, Take 1 tablet (500 mg total) by mouth every 6 (six) hours as needed. (Patient taking differently: Take 1,000 mg by mouth every 8 (eight) hours as needed. ), Disp: 30 tablet, Rfl: 0 .  clonazePAM (KLONOPIN) 0.5 MG tablet, Take 0.25 mg by mouth daily. , Disp: , Rfl:  .  fluticasone (FLONASE) 50 MCG/ACT nasal spray, Place 2 sprays into both nostrils daily as needed for allergies or rhinitis., Disp: , Rfl:  .  ibuprofen (ADVIL,MOTRIN) 800 MG tablet, TAKE ONE TABLET BY MOUTH EVERY 8 HOURS, Disp: 30 tablet, Rfl: 5 .  loratadine (CLARITIN) 10 MG tablet, Take 10 mg by mouth daily., Disp: , Rfl:  .  montelukast (SINGULAIR) 10 MG tablet, Take 10 mg by mouth at bedtime., Disp: , Rfl:  .  Multiple Vitamin (MULTIVITAMIN) tablet, Take 1 tablet by mouth daily., Disp: , Rfl:  .  Olopatadine HCl (PATANASE) 0.6 % SOLN, Place 2 drops (2 puffs  total) into both nostrils 2 (two) times daily., Disp: 30.5 g, Rfl: 5 .  omeprazole (PRILOSEC) 40 MG capsule, Take 40 mg by mouth daily., Disp: , Rfl:  .  potassium chloride SA (K-DUR,KLOR-CON) 20 MEQ tablet, Take by mouth., Disp: , Rfl:  .  vitamin B-12 (CYANOCOBALAMIN) 1000 MCG tablet, Take 1,000 mcg by mouth daily., Disp: , Rfl:  .  Multiple Vitamins-Minerals (MULTIVITAMIN PO), Take 1 tablet by mouth every morning., Disp: , Rfl:  .  norgestimate-ethinyl estradiol (ORTHO-CYCLEN,SPRINTEC,PREVIFEM) 0.25-35 MG-MCG tablet, Take 1 active tablet by mouth daily, Disp: 1 Package, Rfl: 11 Allergies  Allergen Reactions  . Clarithromycin Hives and Itching  . Doxycycline  Shortness Of Breath    Chest pain  . Bentyl [Dicyclomine Hcl] Other (See Comments)    DIZZINESS  . Haldol [Haloperidol] Other (See Comments)    Made her feel very jittery and agitated  . Meclizine     Chest pain  . Morphine And Related Other (See Comments)    Made her feel like she was losing her mind  . Toradol [Ketorolac Tromethamine] Other (See Comments)    Made her feel very jittery and agitated  . Macrobid [Nitrofurantoin Monohyd Macro] Rash  . Sulfa Antibiotics Hives and Rash    Social History  Substance Use Topics  . Smoking status: Former Smoker    Packs/day: 0.25    Years: 10.00    Quit date: 08/23/2008  . Smokeless tobacco: Never Used  . Alcohol use 0.0 oz/week     Comment: ocassionally    Family History  Problem Relation Age of Onset  . Kidney cancer Mother   . Hypertension Mother   . Migraines Mother   . Multiple sclerosis Mother   . Fibromyalgia Mother   . Liver disease Mother        NASH   . Hypertension Father   . Liver disease Father        liver transplant; had fatty liver disease  . Spina bifida Brother   . Migraines Maternal Grandmother   . Hypertension Other   . Colon cancer Paternal Grandfather   . Allergic rhinitis Daughter   . Asthma Daughter   . Allergic rhinitis Son   . Asthma Son   . Angioedema Neg Hx   . Eczema Neg Hx   . Immunodeficiency Neg Hx   . Urticaria Neg Hx        Review of Systems Constitutional: negative for weight loss Genitourinary:negative for abnormal menstrual periods and vaginal discharge   Objective:   BP (!) 136/92   Pulse 86   Wt 171 lb (77.6 kg)   LMP 02/20/2017 Comment: continuous pill use  BMI 31.28 kg/m    General:   alert  Skin:   no rash or abnormalities  Lungs:   clear to auscultation bilaterally  Heart:   regular rate and rhythm, S1, S2 normal, no murmur, click, rub or gallop  Breasts:   normal without suspicious masses, skin or nipple changes or axillary nodes  Abdomen:  normal findings: no  organomegaly, soft, non-tender and no hernia  Pelvis:  External genitalia: normal general appearance Urinary system: urethral meatus normal and bladder without fullness, nontender Vaginal: normal without tenderness, induration or masses Cervix: normal appearance Adnexa: normal bimanual exam Uterus: anteverted and non-tender, normal size   Lab Review Urine pregnancy test Labs reviewed yes Radiologic studies reviewed yes  50% of 15 min visit spent on counseling and coordination of care.    Assessment:  36 y.o., discontinuing Aubra and starting Ortho Cyclen, no contraindications.   Plan:   Follow up in 3 months for Annual  All questions answered. Diagnosis explained in detail, including differential. Discussed healthy lifestyle modifications. Neurosurgeon distributed.     Meds ordered this encounter  Medications  . vitamin B-12 (CYANOCOBALAMIN) 1000 MCG tablet    Sig: Take 1,000 mcg by mouth daily.  Marland Kitchen loratadine (CLARITIN) 10 MG tablet    Sig: Take 10 mg by mouth daily.  . norgestimate-ethinyl estradiol (ORTHO-CYCLEN,SPRINTEC,PREVIFEM) 0.25-35 MG-MCG tablet    Sig: Take 1 active tablet by mouth daily    Dispense:  1 Package    Refill:  11   Orders Placed This Encounter  Procedures  . TSH+T4F+T3Free  . HgB A1c

## 2017-04-10 ENCOUNTER — Other Ambulatory Visit: Payer: Self-pay | Admitting: Obstetrics

## 2017-04-10 DIAGNOSIS — B3731 Acute candidiasis of vulva and vagina: Secondary | ICD-10-CM

## 2017-04-10 DIAGNOSIS — B373 Candidiasis of vulva and vagina: Secondary | ICD-10-CM

## 2017-05-24 ENCOUNTER — Ambulatory Visit: Payer: Medicaid Other | Admitting: Obstetrics

## 2017-06-06 ENCOUNTER — Encounter (HOSPITAL_BASED_OUTPATIENT_CLINIC_OR_DEPARTMENT_OTHER): Payer: Self-pay | Admitting: Adult Health

## 2017-06-06 ENCOUNTER — Emergency Department (HOSPITAL_BASED_OUTPATIENT_CLINIC_OR_DEPARTMENT_OTHER): Payer: Medicaid Other

## 2017-06-06 ENCOUNTER — Emergency Department (HOSPITAL_BASED_OUTPATIENT_CLINIC_OR_DEPARTMENT_OTHER)
Admission: EM | Admit: 2017-06-06 | Discharge: 2017-06-06 | Disposition: A | Discharge status: Home / Self Care | Payer: Medicaid Other | Attending: Emergency Medicine | Admitting: Emergency Medicine

## 2017-06-06 ENCOUNTER — Other Ambulatory Visit: Payer: Self-pay

## 2017-06-06 DIAGNOSIS — Z79899 Other long term (current) drug therapy: Secondary | ICD-10-CM | POA: Insufficient documentation

## 2017-06-06 DIAGNOSIS — I10 Essential (primary) hypertension: Secondary | ICD-10-CM | POA: Insufficient documentation

## 2017-06-06 DIAGNOSIS — K529 Noninfective gastroenteritis and colitis, unspecified: Secondary | ICD-10-CM | POA: Diagnosis not present

## 2017-06-06 DIAGNOSIS — Z87891 Personal history of nicotine dependence: Secondary | ICD-10-CM | POA: Insufficient documentation

## 2017-06-06 DIAGNOSIS — R1033 Periumbilical pain: Secondary | ICD-10-CM

## 2017-06-06 DIAGNOSIS — N739 Female pelvic inflammatory disease, unspecified: Secondary | ICD-10-CM | POA: Diagnosis not present

## 2017-06-06 LAB — COMPREHENSIVE METABOLIC PANEL
ALBUMIN: 3.2 g/dL — AB (ref 3.5–5.0)
ALK PHOS: 42 U/L (ref 38–126)
ALT: 15 U/L (ref 14–54)
AST: 18 U/L (ref 15–41)
Anion gap: 5 (ref 5–15)
BUN: 6 mg/dL (ref 6–20)
CALCIUM: 8.1 mg/dL — AB (ref 8.9–10.3)
CHLORIDE: 100 mmol/L — AB (ref 101–111)
CO2: 26 mmol/L (ref 22–32)
CREATININE: 0.54 mg/dL (ref 0.44–1.00)
GFR calc Af Amer: >60 mL/min (ref >60)
GFR calc non Af Amer: >60 mL/min (ref >60)
GLUCOSE: 90 mg/dL (ref 65–99)
Potassium: 3 mmol/L — ABNORMAL LOW (ref 3.5–5.1)
SODIUM: 131 mmol/L — AB (ref 135–145)
Total Bilirubin: 0.5 mg/dL (ref 0.3–1.2)
Total Protein: 6.4 g/dL — ABNORMAL LOW (ref 6.5–8.1)

## 2017-06-06 LAB — CBC
HCT: 36.6 % (ref 36.0–46.0)
HEMOGLOBIN: 12.2 g/dL (ref 12.0–15.0)
MCH: 29.5 pg (ref 26.0–34.0)
MCHC: 33.3 g/dL (ref 30.0–36.0)
MCV: 88.4 fL (ref 78.0–100.0)
Platelets: 237 10*3/uL (ref 150–400)
RBC: 4.14 MIL/uL (ref 3.87–5.11)
RDW: 13 % (ref 11.5–15.5)
WBC: 6.2 10*3/uL (ref 4.0–10.5)

## 2017-06-06 LAB — WET PREP, GENITAL
Clue Cells Wet Prep HPF POC: NONE SEEN
SPERM: NONE SEEN
TRICH WET PREP: NONE SEEN
Yeast Wet Prep HPF POC: NONE SEEN

## 2017-06-06 LAB — URINALYSIS, ROUTINE W REFLEX MICROSCOPIC
Bilirubin Urine: NEGATIVE
GLUCOSE, UA: NEGATIVE mg/dL
KETONES UR: NEGATIVE mg/dL
LEUKOCYTES UA: NEGATIVE
Nitrite: NEGATIVE
PH: 6.5 (ref 5.0–8.0)
Protein, ur: NEGATIVE mg/dL
Specific Gravity, Urine: 1.01 (ref 1.005–1.030)

## 2017-06-06 LAB — LIPASE, BLOOD: Lipase: 27 U/L (ref 11–51)

## 2017-06-06 LAB — URINALYSIS, MICROSCOPIC (REFLEX)

## 2017-06-06 LAB — PREGNANCY, URINE: Preg Test, Ur: NEGATIVE

## 2017-06-06 MED ORDER — AZITHROMYCIN 250 MG PO TABS: 250.0000 mg | ORAL_TABLET | Freq: Every day | ORAL | 0 refills | Status: DC

## 2017-06-06 MED ORDER — IOPAMIDOL (ISOVUE-300) INJECTION 61%
100.0000 mL | Freq: Once | INTRAVENOUS | Status: AC | PRN
  Administered 2017-06-06: 100 mL via INTRAVENOUS

## 2017-06-06 MED ORDER — ONDANSETRON 4 MG PO TBDP
ORAL_TABLET | ORAL | 0 refills | Status: DC
Start: 2017-06-06 — End: 2019-08-18

## 2017-06-06 MED ORDER — SODIUM CHLORIDE 0.9 % IV BOLUS (SEPSIS)
1000.0000 mL | Freq: Once | INTRAVENOUS | Status: AC
  Administered 2017-06-06: 1000 mL via INTRAVENOUS

## 2017-06-06 MED ORDER — AMOXICILLIN-POT CLAVULANATE 500-125 MG PO TABS: 1.0000 | ORAL_TABLET | Freq: Two times a day (BID) | ORAL | 0 refills | Status: AC

## 2017-06-06 MED ORDER — CEFTRIAXONE SODIUM 250 MG IJ SOLR
250.0000 mg | Freq: Once | INTRAMUSCULAR | Status: DC
  Filled 2017-06-06: qty 250

## 2017-06-06 MED ORDER — POTASSIUM CHLORIDE CRYS ER 20 MEQ PO TBCR
40.0000 meq | EXTENDED_RELEASE_TABLET | Freq: Once | ORAL | Status: AC
  Administered 2017-06-06: 40 meq via ORAL
  Filled 2017-06-06: qty 2

## 2017-06-06 MED ORDER — IBUPROFEN 400 MG PO TABS
600.0000 mg | ORAL_TABLET | Freq: Once | ORAL | Status: AC
  Administered 2017-06-06: 600 mg via ORAL
  Filled 2017-06-06: qty 1

## 2017-06-06 MED ORDER — AZITHROMYCIN 250 MG PO TABS
500.0000 mg | ORAL_TABLET | Freq: Once | ORAL | Status: AC
  Administered 2017-06-06: 500 mg via ORAL
  Filled 2017-06-06: qty 2

## 2017-06-06 MED ORDER — ONDANSETRON HCL 4 MG/2ML IJ SOLN
4.0000 mg | Freq: Once | INTRAMUSCULAR | Status: DC
  Filled 2017-06-06 (×2): qty 2

## 2017-06-06 MED ORDER — CEFTRIAXONE SODIUM 1 G IJ SOLR
1.0000 g | Freq: Once | INTRAMUSCULAR | Status: AC
  Administered 2017-06-06: 1 g via INTRAVENOUS
  Filled 2017-06-06: qty 10

## 2017-06-06 MED FILL — AMOX-CLAV 500-125 MG TABLET: 500-125 | 14 days supply | Qty: 28 | Fill #0

## 2017-06-06 MED FILL — AZITHROMYCIN 250 MG TABLET: 250 | 6 days supply | Qty: 6 | Fill #0

## 2017-06-06 NOTE — ED Triage Notes (Signed)
PResents with lower-mid abdominal pain began Sunday night pain is mostly in right side and umbilicus but radiates up to into epigastric area and to lower back. She endorses lack of appetite and abdominal tenderness with touch. Enodrses nausea, denies vomiting.

## 2017-06-06 NOTE — ED Provider Notes (Addendum)
[facility] EMERGENCY DEPARTMENT Provider Note   CSN: [id number] Arrival date & time: 06/06/17  1009     History   Chief Complaint Chief Complaint  Patient presents with  . Abdominal Pain    HPI  Christina Fox is a 36 y.o. Female with a history of headaches, GERD, kidney stones, hypertension and previous cholecystectomy, who presents complaining of abdominal pain that started late Sunday night around the umbilicus and radiates over to the right lower abdomen. Patient reports yesterday during the day pain improved but last night it got a lot sharper and more constant. Denies fevers or chills. Patient reports this morning she's had poor appetite and nausea, has not had any episodes of vomiting. Pt reports multiple episodes of diarrhea over the past two days, no blood or dark stools. Pt was only able to eat a small part of piece of toast this morning. Patient denies any dysuria, does report some occasional white vaginal discharge, no abnormal bleeding, does not have menstrual period with her birth control. Patient reports she did have unprotected sex one month ago, denies pelvic pain or vaginal discomfort, previous hx of PID. Pt reports ever since her cholecystectomy about a year ago she has had a lot of GI problems, sees Deliah Goody, PA-C with [facility], has had colonoscopy and endoscopy within the last year that have been unremarkable.       Past Medical History:  Diagnosis Date  . Abdominal pain    "right lower quadrant pain and right lower back pain  . Anxiety   . Deviated septum 09/2011  . GERD (gastroesophageal reflux disease)   . Headache(784.0)    sinus  . History of echocardiogram    Echo 11/17: EF 55-60, no RWMA, normal diastolic function  . Hypertension    no meds  . Kidney stones    no current problem  . Migraine   . Nasal turbinate hypertrophy 09/2011   bilat.  . Pelvic inflammatory disease   . Urticaria     Patient Active Problem List   Diagnosis Date Noted  . Gallstone 01/07/2016  . History of colonic polyps 11/27/2015  . Right lower quadrant abdominal pain 11/27/2015  . HTN (hypertension), benign 11/24/2015  . Abdominal pain 11/16/2015  . Headache 10/13/2015  . 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 Anxiety disorder 11/03/2014    Past Surgical History:  Procedure Laterality Date  . CHOLECYSTECTOMY N/A 01/15/2016   Procedure: LAPAROSCOPIC CHOLECYSTECTOMY WITH INTRAOPERATIVE CHOLANGIOGRAM;  Surgeon: Christene Lye, MD;  Location: [facility] ORS;  Service: General;  Laterality: N/A;  . COLONOSCOPY  2007   [facility]: pedunculated 76mm polyp in distal sigmoid, sessile 29mm polyp at splenic flexure, larger polyp tubulovillous adenoma and smaller polyp tubular adenoma   . COLONOSCOPY  09-09-15  . COLONOSCOPY WITH PROPOFOL N/A 03/01/2016   Procedure: COLONOSCOPY WITH PROPOFOL;  Surgeon: Garlan Fair, MD;  Location: [facility];  Service: Endoscopy;  Laterality: N/A;  . NASAL SEPTOPLASTY W/ TURBINOPLASTY  10/04/2011   Procedure: NASAL SEPTOPLASTY WITH TURBINATE REDUCTION;  Surgeon: Ascencion Dike, MD;  Location: [place];  Service: ENT;  Laterality: Bilateral;  . WISDOM TOOTH EXTRACTION  2009    OB History    Gravida Para Term Preterm AB Living   4 4 4     4    SAB TAB Ectopic Multiple Live Births           4      Obstetric Comments   1st Menstrual Cycle:  10  1st Pregnancy:  16        Home Medications    Prior to Admission medications   Medication Sig Start Date End Date Taking? Authorizing Provider  clonazePAM (KLONOPIN) 0.5 MG tablet Take 0.25 mg by mouth daily.    Yes [provider]  fluconazole (DIFLUCAN) 150 MG tablet TAKE ONE TABLET BY MOUTH ONCE 04/10/17  Yes Shelly Bombard, MD  fluticasone [facility]) 50 MCG/ACT nasal spray Place 2 sprays into both nostrils daily as needed for allergies or rhinitis.   Yes [provider]  loratadine (CLARITIN) 10 MG tablet Take 10 mg by mouth daily.    Yes [provider]  montelukast (SINGULAIR) 10 MG tablet Take 10 mg by mouth at bedtime.   Yes [provider]  norgestimate-ethinyl estradiol (ORTHO-CYCLEN,SPRINTEC,PREVIFEM) 0.25-35 MG-MCG tablet Take 1 active tablet by mouth daily 03/02/17  Yes Shelly Bombard, MD  Olopatadine HCl (PATANASE) 0.6 % SOLN Place 2 drops (2 puffs total) into both nostrils 2 (two) times daily. 11/03/16  Yes Padgett, Rae Halsted, MD  omeprazole (PRILOSEC) 40 MG capsule Take 40 mg by mouth daily.   Yes [provider]  potassium chloride SA (K-DUR,KLOR-CON) 20 MEQ tablet Take by mouth. 10/31/16  Yes [provider]  acetaminophen (TYLENOL) 500 MG tablet Take 1 tablet (500 mg total) by mouth every 6 (six) hours as needed. Patient taking differently: Take 1,000 mg by mouth every 8 (eight) hours as needed.  08/31/16   Waynetta Pean, PA-C  ibuprofen (ADVIL,MOTRIN) 800 MG tablet TAKE ONE TABLET BY MOUTH EVERY 8 HOURS 02/22/17   Shelly Bombard, MD  Multiple Vitamin (MULTIVITAMIN) tablet Take 1 tablet by mouth daily.    [provider]  Multiple Vitamins-Minerals (MULTIVITAMIN PO) Take 1 tablet by mouth every morning.    [provider]  vitamin B-12 (CYANOCOBALAMIN) 1000 MCG tablet Take 1,000 mcg by mouth daily.    [provider]    Family History Family History  Problem Relation Age of Onset  . Kidney cancer Mother   . Hypertension Mother   . Migraines Mother   . Multiple sclerosis Mother   . Fibromyalgia Mother   . Liver disease Mother        NASH   . Hypertension Father   . Liver disease Father        liver transplant; had fatty liver disease  . Spina bifida Brother   . Migraines Maternal Grandmother   . Hypertension Other   . Colon cancer Paternal Grandfather   . Allergic rhinitis Daughter   . Asthma Daughter   . Allergic rhinitis Son   . Asthma Son   . Angioedema Neg Hx   . Eczema Neg Hx   . Immunodeficiency Neg Hx   . Urticaria Neg  Hx     Social History Social History   Tobacco Use  . Smoking status: Former Smoker    Packs/day: 0.25    Years: 10.00    Pack years: 2.50    Last attempt to quit: 08/23/2008    Years since quitting: 8.7  . Smokeless tobacco: Never Used  Substance Use Topics  . Alcohol use: Yes    Alcohol/week: 0.0 oz    Comment: ocassionally  . Drug use: No     Allergies   Clarithromycin; Doxycycline; Bentyl [dicyclomine hcl]; Haldol [haloperidol]; Meclizine; Morphine and related; Toradol [ketorolac tromethamine]; Macrobid [nitrofurantoin monohyd macro]; and Sulfa antibiotics   Review of Systems Review of Systems  Constitutional: Positive  for appetite change. Negative for chills and fever.  HENT: Negative for congestion, rhinorrhea and sore throat.   Eyes: Negative for discharge, redness and itching.  Respiratory: Negative for cough, chest tightness and shortness of breath.   Cardiovascular: Negative for chest pain.  Gastrointestinal: Positive for abdominal pain, diarrhea and nausea. Negative for blood in stool, constipation and vomiting.  Genitourinary: Positive for vaginal discharge. Negative for dysuria, flank pain, pelvic pain and vaginal bleeding.  Musculoskeletal: Negative for arthralgias and myalgias.  Skin: Negative for rash.  Neurological: Negative for dizziness, weakness, light-headedness, numbness and headaches.     Physical Exam Updated Vital Signs BP (!) 135/109 (BP Location: Left Arm)   Pulse (!) 102   Temp 98.3 F (36.8 C) (Oral)   Resp 16   Ht 5\' 2"  (1.575 m)   Wt 79.4 kg (175 lb)   SpO2 100%   BMI 32.01 kg/m   Physical Exam  Constitutional: She appears well-developed and well-nourished. No distress.  HENT:  Head: Normocephalic and atraumatic.  Mouth/Throat: Oropharynx is clear and moist.  Eyes: Right eye exhibits no discharge. Left eye exhibits no discharge.  Neck: Normal range of motion. Neck supple.  Cardiovascular: Normal rate, regular rhythm, normal  heart sounds and intact distal pulses.  Pulmonary/Chest: Effort normal and breath sounds normal. No stridor. No respiratory distress. She has no wheezes. She has no rales. She exhibits no tenderness.  Abdominal: Soft. Bowel sounds are normal. She exhibits no distension. There is tenderness. There is rebound and guarding.  Tenderness to palpation over umbilicus and RLQ with some guarding, some rebound tenderness in RLQ, there is tenderness at Mcburney's point, neg murphy's sign, no CVA tenderness  Genitourinary:  Genitourinary Comments: There is thick white discharge present at the cervical os with some cervical erythema, no bleeding, moerate CMT, no adnexal tenderness or masses  Musculoskeletal: She exhibits no edema or deformity.  Neurological: She is alert. Coordination normal.  Skin: Skin is warm and dry. Capillary refill takes less than 2 seconds. She is not diaphoretic.  Psychiatric: She has a normal mood and affect. Her behavior is normal.  Nursing note and vitals reviewed.    ED Treatments / Results  Labs (all labs ordered are listed, but only abnormal results are displayed) Labs Reviewed  WET PREP, GENITAL - Abnormal; Notable for the following components:      Result Value   WBC, Wet Prep HPF POC MANY (*)    All other components within normal limits  URINALYSIS, ROUTINE W REFLEX MICROSCOPIC - Abnormal; Notable for the following components:   Hgb urine dipstick TRACE (*)    All other components within normal limits  URINALYSIS, MICROSCOPIC (REFLEX) - Abnormal; Notable for the following components:   Bacteria, UA FEW (*)    Squamous Epithelial / LPF 6-30 (*)    All other components within normal limits  COMPREHENSIVE METABOLIC PANEL - Abnormal; Notable for the following components:   Sodium 131 (*)    Potassium 3.0 (*)    Chloride 100 (*)    Calcium 8.1 (*)    Total Protein 6.4 (*)    Albumin 3.2 (*)    All other components within normal limits  PREGNANCY, URINE  CBC    LIPASE, BLOOD  HIV ANTIBODY (ROUTINE TESTING)  RPR  GC/CHLAMYDIA PROBE AMP ([place]) NOT AT [facility]    EKG  EKG Interpretation None       Radiology Ct Abdomen Pelvis W Contrast  Result Date: 06/06/2017 CLINICAL DATA:  Lower  mid abdomen pain, primarily right-sided and paraumbilical EXAM: CT ABDOMEN AND PELVIS WITH CONTRAST TECHNIQUE: Multidetector CT imaging of the abdomen and pelvis was performed using the standard protocol following bolus administration of intravenous contrast. CONTRAST:  143mL ISOVUE-300 IOPAMIDOL (ISOVUE-300) INJECTION 61% COMPARISON:  CT abdomen pelvis of 02/17/2016 FINDINGS: Lower chest: The lung bases are clear. The heart is within normal limits in size. Hepatobiliary: The liver enhances with no focal abnormality and no ductal dilatation is seen. Surgical clips are present from prior cholecystectomy. Pancreas: The pancreas is normal in size and the pancreatic duct is not dilated. Spleen: The spleen is unremarkable. Adrenals/Urinary Tract: The adrenal glands appear normal. The kidneys enhance and there is little change in a 3 mm nonobstructing calculus in the lower pole of the right kidney. The ureters appear normal in caliber. The urinary bladder is decompressed and cannot be evaluated. Stomach/Bowel: The stomach is largely decompressed. No small bowel distention is seen. The colon is largely decompressed and colonic edema particularly involving the transverse and right colon cannot be excluded. However, there is mucosa edema of the distal and terminal ileum involving several loops within the right lower quadrant and right pelvis. No definite abscess is seen although there is some fluid within the pelvis. These findings are most consistent with inflammatory or infectious ileitis, possibly Crohn's disease. The appendix appears normal. Vascular/Lymphatic: The abdominal aorta is normal in caliber. No adenopathy is seen with only small mesenteric lymph nodes present.  Reproductive: The uterus is normal in size. No adnexal lesion is seen. Other: None. Musculoskeletal: The lumbar vertebrae are in normal alignment with normal intervertebral disc spaces. The SI joints appear corticated. IMPRESSION: 1. Mucosal edema of the distal and terminal ileum with a small amount of fluid in the pelvis, findings most consistent with infectious or inflammatory bowel disease possibly Crohn's disease. No abscess is seen. 2. The appendix is unremarkable. 3. Nonobstructing 3 mm right lower pole renal calculus. Electronically Signed   By: Ivar Drape M.D.   On: 06/06/2017 12:08    Procedures Procedures (including critical care time)  Medications Ordered in ED Medications  sodium chloride 0.9 % bolus 1,000 mL (0 mLs Intravenous Stopped 06/06/17 1335)  iopamidol (ISOVUE-300) 61 % injection 100 mL (100 mLs Intravenous Contrast Given 06/06/17 1143)  potassium chloride SA (K-DUR,KLOR-CON) CR tablet 40 mEq (40 mEq Oral Given 06/06/17 1356)  ibuprofen (ADVIL,MOTRIN) tablet 600 mg (600 mg Oral Given 06/06/17 1413)  azithromycin (ZITHROMAX) tablet 500 mg (500 mg Oral Given 06/06/17 1450)  cefTRIAXone (ROCEPHIN) 1 g in dextrose 5 % 50 mL IVPB (0 g Intravenous Stopped 06/06/17 1606)     Initial Impression / Assessment and Plan / ED Course  I have reviewed the triage vital signs and the nursing notes.  Pertinent labs & imaging results that were available during my care of the patient were reviewed by me and considered in my medical decision making (see chart for details).  Patient presents with abdominal pain that started at the umbilicus, and now radiates into the right lower quadrant, with associated poor appetite, nausea and diarrhea. On exam patient is mildly hypertensive, but vitals are otherwise normal, afebrile and nontoxic appearing, although does appear somewhat uncomfortable. On exam patient has notable tenderness in the umbilicus and right lower quadrant with guarding and rebound  tenderness, exam concerning for appendicitis or other acute intra-abdominal pathology, will proceed with abdominal labs and contrasted CT of the abdomen and pelvis. Patient also reports some vaginal discharge, moderate amount of white  discharge coming from the cervical os on exam, some cervicitis present, moderate cervical motion tenderness on exam, no adnexal tenderness or masses, wet prep and GC/Chlamydia cultures collected, as well as HIV and RPR. Patient offered medication for pain and nausea, but refuses, IV fluids given.  Labs so no leukocytosis, patient does have hypokalemia of 3, replaced with 40 mEq of potassium, patient does take potassium at baseline, this hypokalemia is not surprising in the setting of diarrhea, no other electrolyte abnormalities requiring intervention, kidney and liver function normal, UA without evidence of infection, urine pregnancy negative, wet prep shows many to be PVCs, no clue cells or trichomoniasis. Lipase normal. STI testing pending.  Abdominal CT shows inflammation of the terminal ileum, which could be inflammatory or infectious, patient does not have a history of inflammatory bowel disease, and has been worked up for this within the last year, we will treat for analysis this colitis is infectious, and have patient follow up with GI. Appendix appears normal there is no other acute findings in the abdomen. 3 mm stone in the left renal pole, patient does have history of kidney stones. Pelvic organs are unremarkable, no evidence of adnexal abscesses or masses, but given exam and history would like to cover for PID as well  Consultated inpatient pharmacy, given patient's need to cover for infectious colitis as well as PID, and patient's multiple antibiotic allergies. Pharmacist also consulted with infectious disease pharmacy at [facility] and recommends 2 weeks of Augmentin and 6 days of 250 Azithro, after initial doses of azithromycin and ceftriaxone today. Pain treated in the  ED with ibuprofen per patient's request. Patient is stable for discharge home, discussed antibiotic regimen with patient, and she expresses understanding. Discussed using probiotics, and taking Augmentin with food on her stomach to prevent additional diarrhea. Patient to follow-up with her PCP, as well as GI. Discussed return precautions that would warrant sooner evaluation. Patient's presses understanding and is in agreement with plan. Vitals have remained normal throughout stay, patient is in no acute distress at discharge.  Final Clinical Impressions(s) / ED Diagnoses   Final diagnoses:  Colitis  PID (pelvic inflammatory disease)  Periumbilical abdominal pain    ED Discharge Orders        Ordered    amoxicillin-clavulanate (AUGMENTIN) 500-125 MG tablet  2 times daily     06/06/17 1543    azithromycin (ZITHROMAX) 250 MG tablet  Daily     06/06/17 1543    ondansetron (ZOFRAN ODT) 4 MG disintegrating tablet     06/06/17 1543       Jacqlyn Larsen, PA-C 06/07/17 0902    Ward, Delice Bison, DO 06/12/17 2303    Jacqlyn Larsen, PA-C 06/13/17 1225    Ward, Delice Bison, DO 06/13/17 2325

## 2017-06-06 NOTE — Discharge Instructions (Signed)
Please complete both courses of antibiotics as directed, take full course even if your symptoms are resolving. Augmentin can cause some diarrhea and stomach upset, please take this with food on her stomach and continue to take your align probiotic. Please call to schedule follow-up appointment with your GI office. If you have worsening abdominal pain, persistent fevers, nausea and vomiting and or unable to keep down food, or bloody diarrhea return to the ED for sooner evaluation.

## 2017-06-06 NOTE — ED Notes (Signed)
ED Provider at bedside. 

## 2017-06-06 NOTE — ED Notes (Signed)
Family at bedside. 

## 2017-06-07 LAB — RPR: RPR: NONREACTIVE

## 2017-06-07 LAB — HIV ANTIBODY (ROUTINE TESTING W REFLEX): HIV Screen 4th Generation wRfx: NONREACTIVE

## 2017-06-07 LAB — GC/CHLAMYDIA PROBE AMP (~~LOC~~) NOT AT ARMC
Chlamydia: NEGATIVE
Neisseria Gonorrhea: NEGATIVE

## 2017-07-01 ENCOUNTER — Other Ambulatory Visit: Payer: Self-pay | Admitting: Obstetrics

## 2017-07-01 DIAGNOSIS — B373 Candidiasis of vulva and vagina: Secondary | ICD-10-CM

## 2017-07-01 DIAGNOSIS — B3731 Acute candidiasis of vulva and vagina: Secondary | ICD-10-CM

## 2017-07-02 ENCOUNTER — Other Ambulatory Visit: Payer: Self-pay | Admitting: Obstetrics

## 2017-09-07 IMAGING — DX DG CHEST 2V
2 series · 2 of 2 positions shown · non-contrast
Comparison: 10/15/2015

CLINICAL DATA: Chest pain, hypertension

EXAM:
CHEST  2 VIEW

[chest pa]
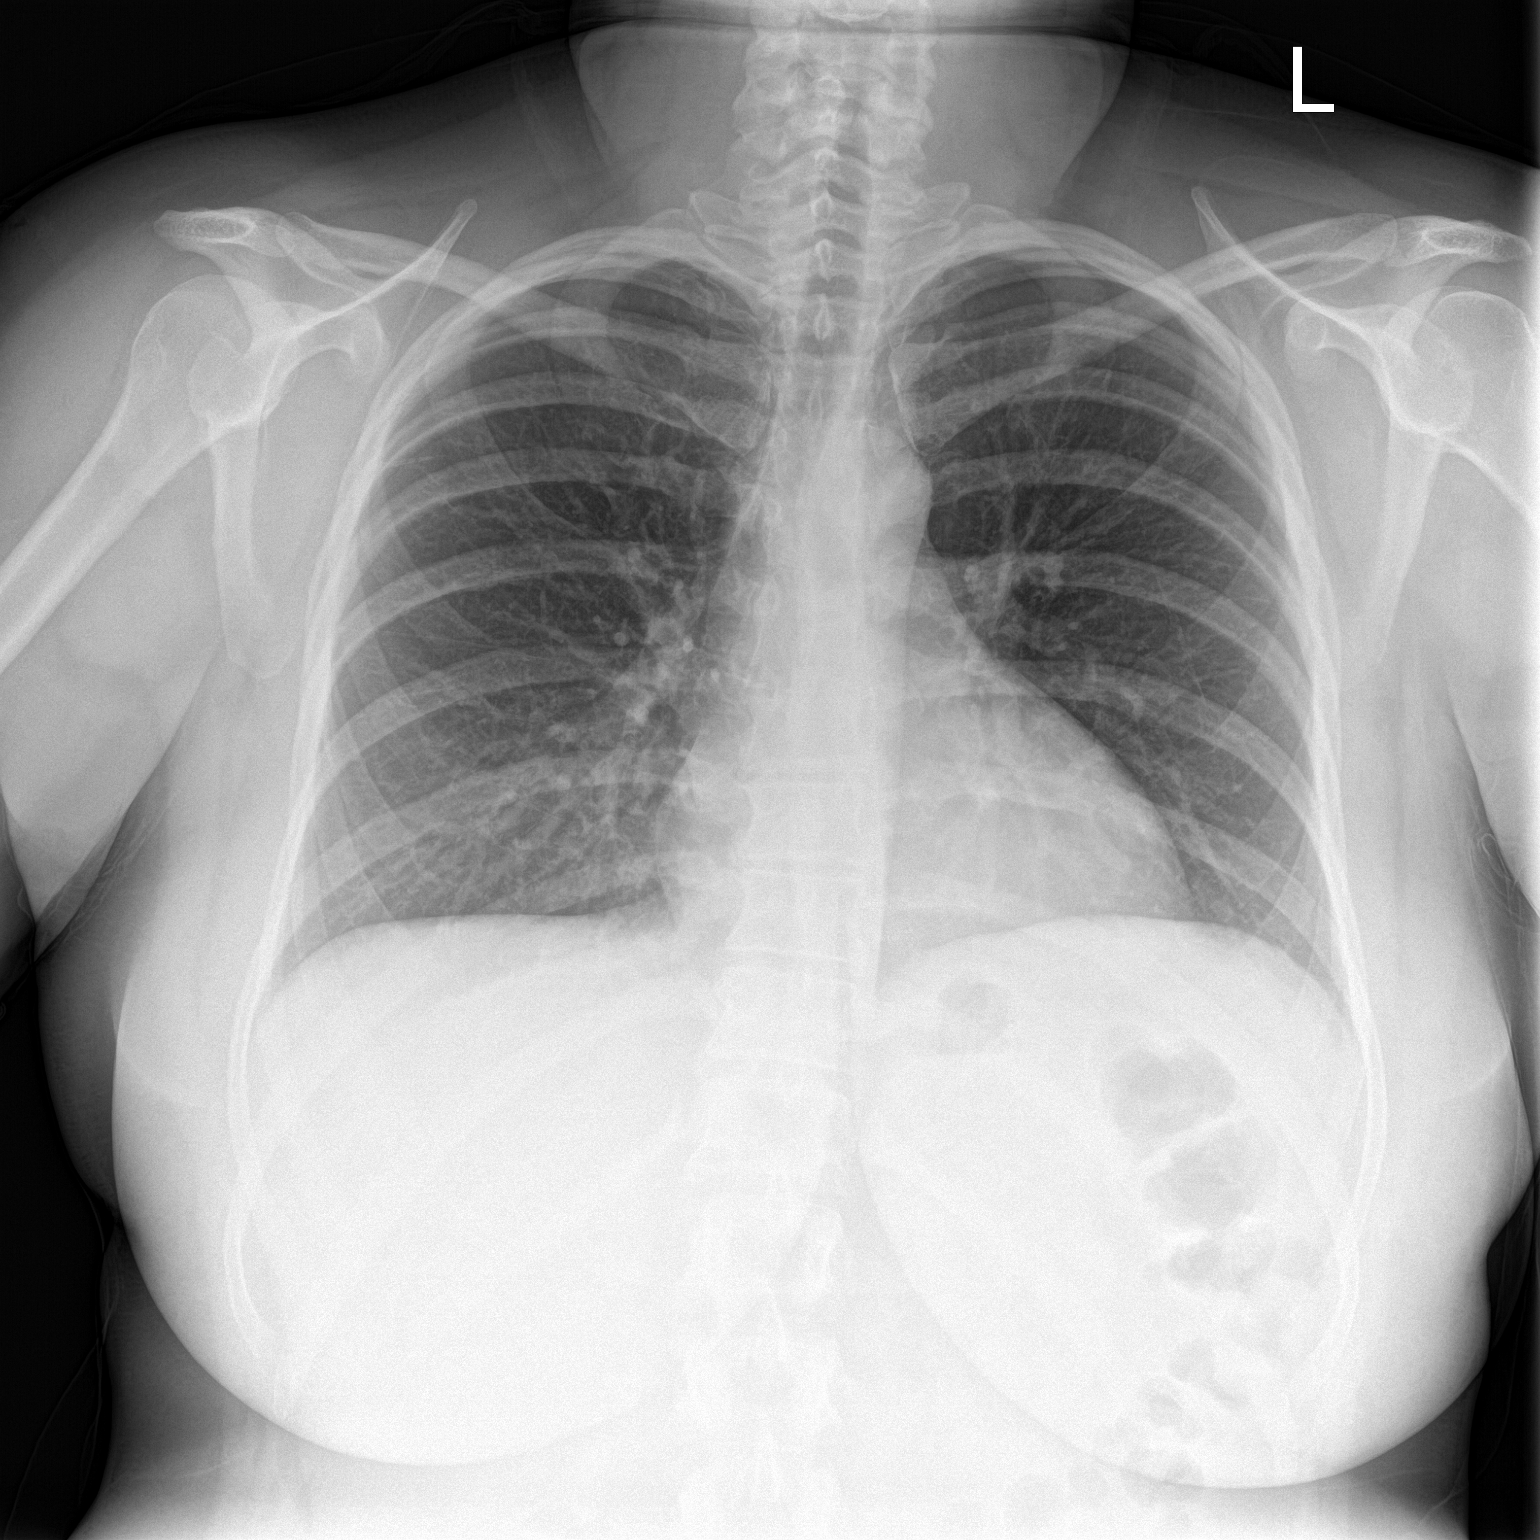

[chest lat]
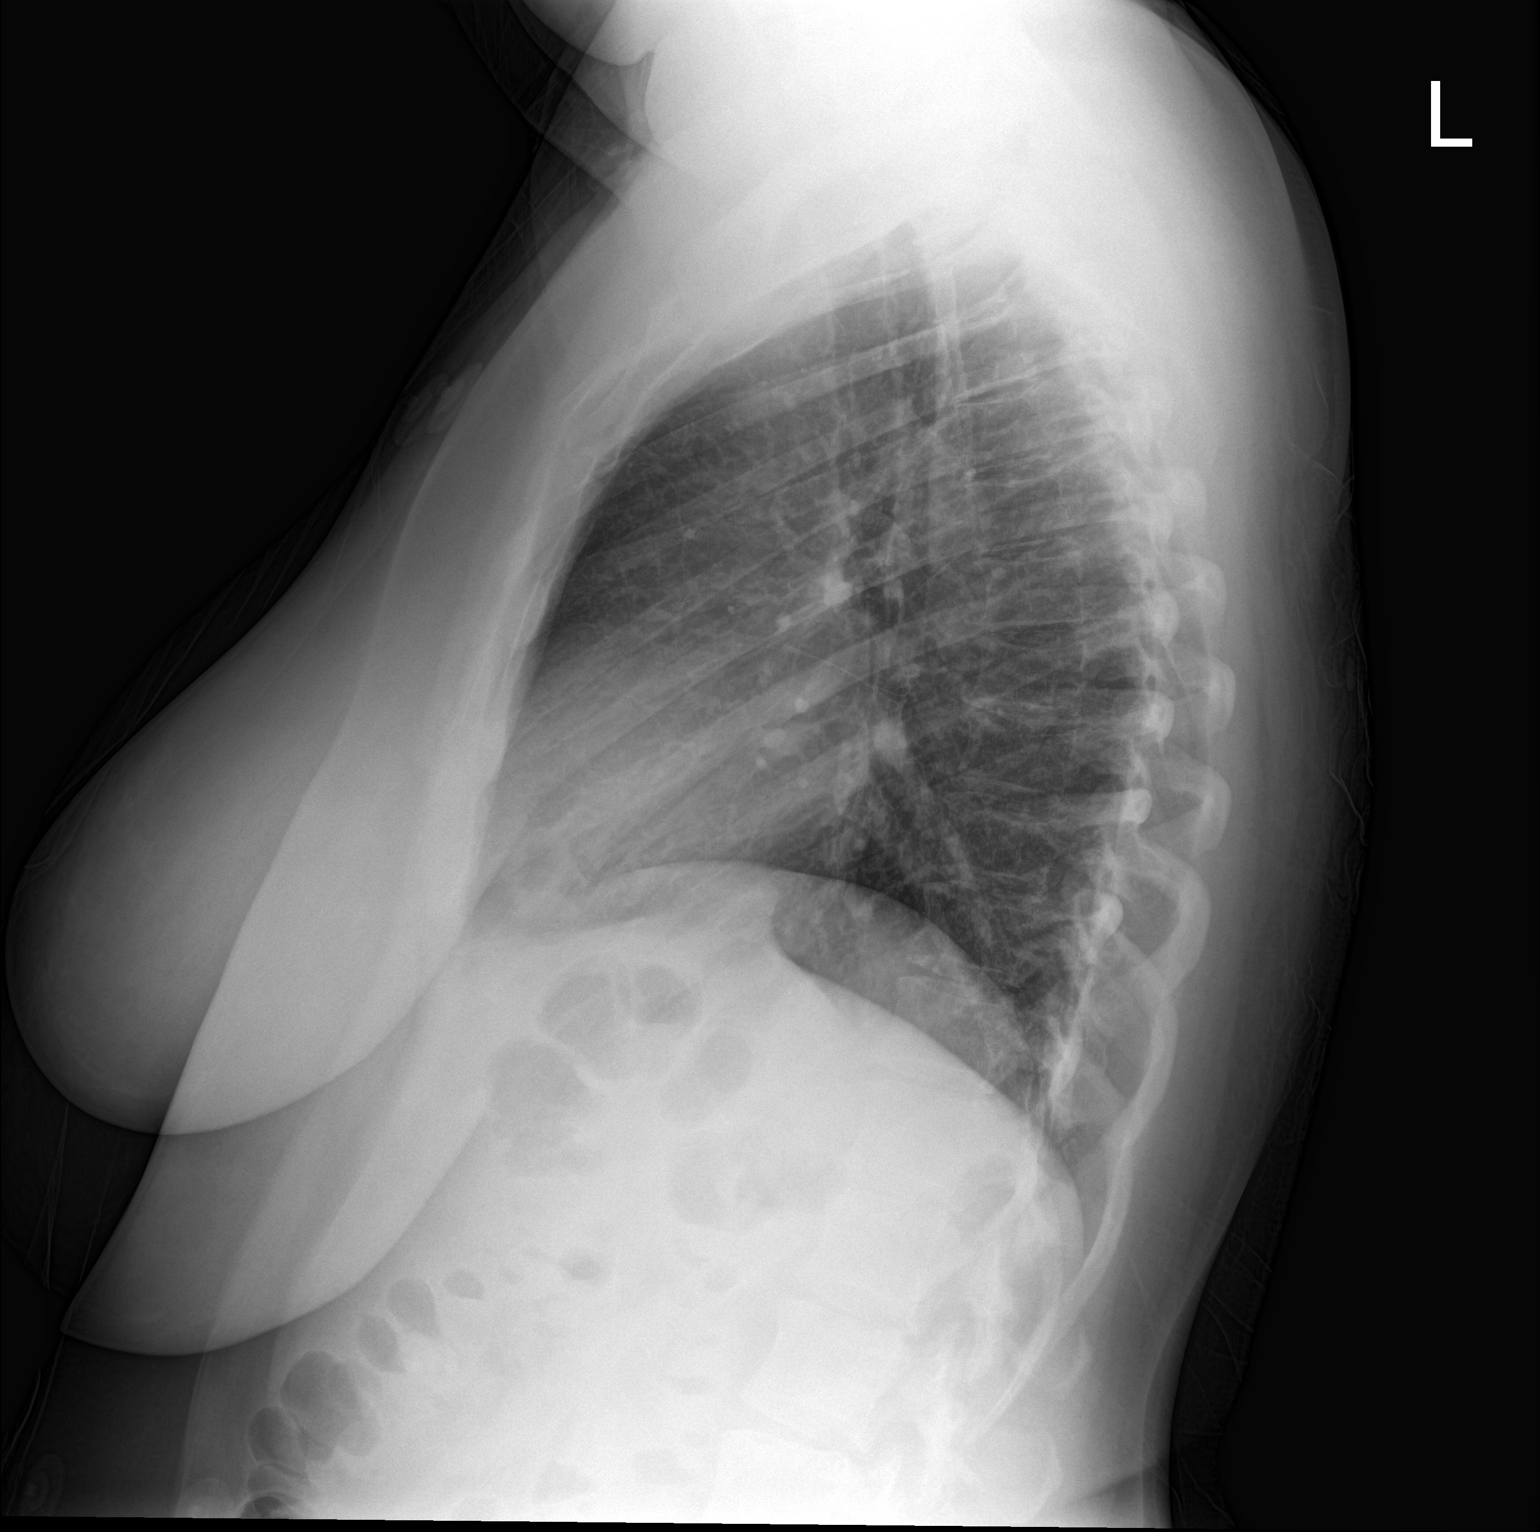

[2 of 2 positions shown; findings below may reference images not displayed]

FINDINGS: The heart size and mediastinal contours are within normal limits.
Both lungs are clear. The visualized skeletal structures are
unremarkable.
IMPRESSION: No active cardiopulmonary disease.

## 2017-09-12 ENCOUNTER — Encounter: Payer: Self-pay | Admitting: *Deleted

## 2017-09-16 ENCOUNTER — Other Ambulatory Visit: Payer: Self-pay

## 2017-09-16 ENCOUNTER — Emergency Department (HOSPITAL_BASED_OUTPATIENT_CLINIC_OR_DEPARTMENT_OTHER): Payer: Medicaid Other

## 2017-09-16 ENCOUNTER — Emergency Department (HOSPITAL_BASED_OUTPATIENT_CLINIC_OR_DEPARTMENT_OTHER)
Admission: EM | Admit: 2017-09-16 | Discharge: 2017-09-16 | Disposition: A | Discharge status: Home / Self Care | Payer: Medicaid Other | Attending: Emergency Medicine | Admitting: Emergency Medicine

## 2017-09-16 ENCOUNTER — Encounter (HOSPITAL_BASED_OUTPATIENT_CLINIC_OR_DEPARTMENT_OTHER): Payer: Self-pay | Admitting: Emergency Medicine

## 2017-09-16 DIAGNOSIS — Z79899 Other long term (current) drug therapy: Secondary | ICD-10-CM | POA: Diagnosis not present

## 2017-09-16 DIAGNOSIS — R079 Chest pain, unspecified: Secondary | ICD-10-CM | POA: Diagnosis present

## 2017-09-16 DIAGNOSIS — R072 Precordial pain: Secondary | ICD-10-CM | POA: Diagnosis not present

## 2017-09-16 DIAGNOSIS — I10 Essential (primary) hypertension: Secondary | ICD-10-CM | POA: Diagnosis not present

## 2017-09-16 LAB — PREGNANCY, URINE: PREG TEST UR: NEGATIVE

## 2017-09-16 LAB — D-DIMER, QUANTITATIVE: D-Dimer, Quant: 0.34 ug/mL-FEU (ref 0.00–0.50)

## 2017-09-16 LAB — CBC
HEMATOCRIT: 39.9 % (ref 36.0–46.0)
Hemoglobin: 13.4 g/dL (ref 12.0–15.0)
MCH: 30.2 pg (ref 26.0–34.0)
MCHC: 33.6 g/dL (ref 30.0–36.0)
MCV: 90.1 fL (ref 78.0–100.0)
PLATELETS: 279 10*3/uL (ref 150–400)
RBC: 4.43 MIL/uL (ref 3.87–5.11)
RDW: 13 % (ref 11.5–15.5)
WBC: 6.2 10*3/uL (ref 4.0–10.5)

## 2017-09-16 LAB — BASIC METABOLIC PANEL
Anion gap: 10 (ref 5–15)
BUN: 6 mg/dL (ref 6–20)
CO2: 24 mmol/L (ref 22–32)
CREATININE: 0.73 mg/dL (ref 0.44–1.00)
Calcium: 9.1 mg/dL (ref 8.9–10.3)
Chloride: 102 mmol/L (ref 101–111)
Glucose, Bld: 105 mg/dL — ABNORMAL HIGH (ref 65–99)
POTASSIUM: 3.3 mmol/L — AB (ref 3.5–5.1)
SODIUM: 136 mmol/L (ref 135–145)

## 2017-09-16 LAB — TROPONIN I: Troponin I: <0.03 ng/mL (ref <0.03)

## 2017-09-16 NOTE — ED Notes (Signed)
ED Provider at bedside. 

## 2017-09-16 NOTE — Discharge Instructions (Signed)

## 2017-09-16 NOTE — ED Triage Notes (Signed)
Chest pain x 3 days with mild SOB. Sent from UC.

## 2017-09-16 NOTE — ED Provider Notes (Signed)
Emergency Department Provider Note   I have reviewed the triage vital signs and the nursing notes.   HISTORY  Chief Complaint Chest Pain   HPI Christina Fox is a 37 y.o. female with PMH of GERD, anxiety, and PID presents to the emergency department for evaluation of left upper chest wall pain which is occurred intermittently over the past 3 days.  She saw her primary care physician yesterday who suspected this was secondary to musculoskeletal chest wall pain.  She has been treating with warm compresses and over-the-counter medication with no relief.  She states the pain occurs at irregular intervals.  No specific modifying factors.  She denies any dyspnea.  No fevers or chills.  Past Medical History:  Diagnosis Date  . Abdominal pain    "right lower quadrant pain and right lower back pain  . Anxiety   . Deviated septum 09/2011  . GERD (gastroesophageal reflux disease)   . Headache(784.0)    sinus  . History of echocardiogram    Echo 11/17: EF 55-60, no RWMA, normal diastolic function  . Hypertension    no meds  . Kidney stones    no current problem  . Migraine   . Nasal turbinate hypertrophy 09/2011   bilat.  . Pelvic inflammatory disease   . Urticaria     Patient Active Problem List   Diagnosis Date Noted  . Gallstone 01/07/2016  . History of colonic polyps 11/27/2015  . Right lower quadrant abdominal pain 11/27/2015  . HTN (hypertension), benign 11/24/2015  . Abdominal pain 11/16/2015  . Headache 10/13/2015  . 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 Anxiety disorder 11/03/2014    Past Surgical History:  Procedure Laterality Date  . CHOLECYSTECTOMY N/A 01/15/2016   Procedure: LAPAROSCOPIC CHOLECYSTECTOMY WITH INTRAOPERATIVE CHOLANGIOGRAM;  Surgeon: Christene Lye, MD;  Location: [facility];  Service: General;  Laterality: N/A;  . COLONOSCOPY  2007   [facility]: pedunculated 16mm polyp in distal sigmoid, sessile 28mm polyp at splenic flexure, larger polyp tubulovillous  adenoma and smaller polyp tubular adenoma   . COLONOSCOPY  09-09-15  . COLONOSCOPY WITH PROPOFOL N/A 03/01/2016   Procedure: COLONOSCOPY WITH PROPOFOL;  Surgeon: Garlan Fair, MD;  Location: [facility];  Service: Endoscopy;  Laterality: N/A;  . NASAL SEPTOPLASTY W/ TURBINOPLASTY  10/04/2011   Procedure: NASAL SEPTOPLASTY WITH TURBINATE REDUCTION;  Surgeon: Ascencion Dike, MD;  Location: [place];  Service: ENT;  Laterality: Bilateral;  . WISDOM TOOTH EXTRACTION  2009    Current Outpatient Rx  . Order #: [id number] Class: Print  . Order #: [id number] Class: Print  . Order #: [id number] Class: Historical Med  . Order #: [id number] Class: Normal  . Order #: [id number] Class: Historical Med  . Order #: [id number] Class: Print  . Order #: [id number] Class: Historical Med  . Order #: [id number] Class: Historical Med  . Order #: [id number] Class: Historical Med  . Order #: [id number] Class: Normal  . Order #: [id number] Class: Historical Med  . Order #: [id number] Class: Historical Med  . Order #: [id number] Class: Historical Med  . Order #: [id number] Class: Normal  . Order #: [id number] Class: Normal  . Order #: [id number] Class: Historical Med  . Order #: [id number] Class: Historical Med    Allergies Clarithromycin; Doxycycline; Bentyl [dicyclomine hcl]; Haldol [haloperidol]; Meclizine; Morphine and related; Toradol [ketorolac tromethamine]; Macrobid [nitrofurantoin monohyd macro]; and Sulfa antibiotics  Family History  Problem Relation Age of Onset  . Kidney cancer Mother   . Hypertension Mother   . Migraines Mother   . Multiple sclerosis Mother   .  Fibromyalgia Mother   . Liver disease Mother        NASH   . Hypertension Father   . Liver disease Father        liver transplant; had fatty liver disease  . Spina bifida Brother   . Migraines Maternal Grandmother   . Hypertension Other   . Colon cancer Paternal Grandfather   . Allergic rhinitis Daughter   . Asthma Daughter   . Allergic  rhinitis Son   . Asthma Son   . Angioedema Neg Hx   . Eczema Neg Hx   . Immunodeficiency Neg Hx   . Urticaria Neg Hx     Social History Social History   Tobacco Use  . Smoking status: Former Smoker    Packs/day: 0.25    Years: 10.00    Pack years: 2.50    Last attempt to quit: 08/23/2008    Years since quitting: 9.0  . Smokeless tobacco: Never Used  Substance Use Topics  . Alcohol use: Yes    Alcohol/week: 0.0 oz    Comment: ocassionally  . Drug use: No    Review of Systems  Constitutional: No fever/chills Eyes: No visual changes. ENT: No sore throat. Cardiovascular: Positive chest pain. Respiratory: Denies shortness of breath. Gastrointestinal: No abdominal pain.  No nausea, no vomiting.  No diarrhea.  No constipation. Genitourinary: Negative for dysuria. Musculoskeletal: Negative for back pain. Skin: Negative for rash. Neurological: Negative for headaches, focal weakness or numbness.  10-point ROS otherwise negative.  ____________________________________________   PHYSICAL EXAM:  VITAL SIGNS: ED Triage Vitals  Enc Vitals Group     BP 09/16/17 1453 (!) 147/96     Pulse Rate 09/16/17 1453 80     Resp 09/16/17 1453 16     Temp 09/16/17 1453 98.8 F (37.1 C)     Temp Source 09/16/17 1453 Oral     SpO2 09/16/17 1453 99 %     Weight 09/16/17 1451 184 lb (83.5 kg)     Height 09/16/17 1451 5\' 2"  (1.575 m)     Pain Score 09/16/17 1451 5   Constitutional: Alert and oriented. Well appearing and in no acute distress. Eyes: Conjunctivae are normal. Head: Atraumatic. Nose: No congestion/rhinnorhea. Mouth/Throat: Mucous membranes are moist.  Neck: No stridor.  Cardiovascular: Normal rate, regular rhythm. Good peripheral circulation. Grossly normal heart sounds.   Respiratory: Normal respiratory effort.  No retractions. Lungs CTAB. Gastrointestinal: Soft and nontender. No distention.  Musculoskeletal: No lower extremity tenderness nor edema. No gross deformities  of extremities. No tenderness to palpation of the left chest wall.  Neurologic:  Normal speech and language. No gross focal neurologic deficits are appreciated.  Skin:  Skin is warm, dry and intact. No rash noted.   ____________________________________________   LABS (all labs ordered are listed, but only abnormal results are displayed)  Labs Reviewed  BASIC METABOLIC PANEL - Abnormal; Notable for the following components:      Result Value   Potassium 3.3 (*)    Glucose, Bld 105 (*)    All other components within normal limits  CBC  TROPONIN I  PREGNANCY, URINE  D-DIMER, QUANTITATIVE (NOT AT [facility])   ____________________________________________  EKG   EKG Interpretation  Date/Time:  Saturday September 16 2017 14:55:42 EDT Ventricular Rate:  75 PR Interval:  134 QRS Duration: 84 QT Interval:  402 QTC Calculation: 448 R Axis:   90 Text Interpretation:  Normal sinus rhythm Rightward axis Nonspecific ST abnormality Abnormal ECG No STEMI. Similar  to prior.  Confirmed by Nanda Quinton [phone]) on 09/16/2017 4:16:54 PM Also confirmed by Nanda Quinton [phone]), editor Philomena Doheny [phone])  on 09/17/2017 8:28:12 AM       ____________________________________________  RADIOLOGY  Dg Chest 2 View  Result Date: 09/16/2017 CLINICAL DATA:  Left-sided chest pain 3 days which shortness-of-breath. EXAM: CHEST - 2 VIEW COMPARISON:  12/07/2016 FINDINGS: The heart size and mediastinal contours are within normal limits. Both lungs are clear. The visualized skeletal structures are unremarkable. IMPRESSION: No active cardiopulmonary disease. Electronically Signed   By: Marin Olp M.D.   On: 09/16/2017 15:19    ____________________________________________   PROCEDURES  Procedure(s) performed:   Procedures  None ____________________________________________   INITIAL IMPRESSION / ASSESSMENT AND PLAN / ED COURSE  Pertinent labs & imaging results that were available during my care of the  patient were reviewed by me and considered in my medical decision making (see chart for details).  Patient presents to the emergency department for evaluation of atypical chest pain occurring intermittently over the past 3 days.  She is afebrile with normal exam.  No significant tenderness to palpation of the left chest wall or sternum.  She is low risk for PE by [place].  Low risk by heart score.  Troponin and other lab work is unremarkable from triage.  Chest x-ray reviewed with no acute findings.  Plan for d-dimer and reassess.  Labs reviewed with no acute findings. CXR negative. Plan for discharge with outpatient Cardiology f/u. Number provided to call on Monday.   At this time, I do not feel there is any life-threatening condition present. I have reviewed and discussed all results (EKG, imaging, lab, urine as appropriate), exam findings with patient. I have reviewed nursing notes and appropriate previous records.  I feel the patient is safe to be discharged home without further emergent workup. Discussed usual and customary return precautions. Patient and family (if present) verbalize understanding and are comfortable with this plan.  Patient will follow-up with their primary care provider. If they do not have a primary care provider, information for follow-up has been provided to them. All questions have been answered.  ____________________________________________  FINAL CLINICAL IMPRESSION(S) / ED DIAGNOSES  Final diagnoses:  Precordial chest pain    Note:  This document was prepared using Dragon voice recognition software and may include unintentional dictation errors.  Nanda Quinton, MD Emergency Medicine    Long, Wonda [facility], MD 09/17/17 [phone]

## 2017-09-16 NOTE — ED Notes (Signed)
Pt on cardiac monitor and auto VS 

## 2017-10-03 ENCOUNTER — Other Ambulatory Visit (HOSPITAL_COMMUNITY)
Admission: RE | Admit: 2017-10-03 | Discharge: 2017-10-03 | Disposition: A | Discharge status: Home / Self Care | Payer: Medicaid Other | Source: Ambulatory Visit | Attending: Obstetrics | Admitting: Obstetrics

## 2017-10-03 ENCOUNTER — Ambulatory Visit (INDEPENDENT_AMBULATORY_CARE_PROVIDER_SITE_OTHER): Payer: Medicaid Other | Admitting: Obstetrics

## 2017-10-03 ENCOUNTER — Encounter: Payer: Self-pay | Admitting: Obstetrics

## 2017-10-03 VITALS — BP 134/90 | HR 95 | Ht 62.0 in | Wt 183.8 lb

## 2017-10-03 DIAGNOSIS — R5382 Chronic fatigue, unspecified: Secondary | ICD-10-CM

## 2017-10-03 DIAGNOSIS — Z3009 Encounter for other general counseling and advice on contraception: Secondary | ICD-10-CM

## 2017-10-03 DIAGNOSIS — Z0000 Encounter for general adult medical examination without abnormal findings: Secondary | ICD-10-CM

## 2017-10-03 DIAGNOSIS — N898 Other specified noninflammatory disorders of vagina: Secondary | ICD-10-CM

## 2017-10-03 DIAGNOSIS — Z01419 Encounter for gynecological examination (general) (routine) without abnormal findings: Secondary | ICD-10-CM

## 2017-10-03 DIAGNOSIS — N644 Mastodynia: Secondary | ICD-10-CM

## 2017-10-03 MED ORDER — LEVONORGEST-ETH ESTRADIOL-IRON 0.1-20 MG-MCG(21) PO TABS: 1.0000 | ORAL_TABLET | Freq: Every day | ORAL | 11 refills | Status: DC

## 2017-10-03 NOTE — Progress Notes (Signed)
Subjective:        Christina Fox is a 37 y.o. female here for a routine exam.  Current complaints: Breast tenderness.  BTB with OCP's.    Personal health questionnaire:  Is patient [name], have a family history of breast and/or ovarian cancer: no Is there a family history of uterine cancer diagnosed at age < 63, gastrointestinal cancer, urinary tract cancer, family member who is a Field seismologist syndrome-associated carrier: no Is the patient overweight and hypertensive, family history of diabetes, personal history of gestational diabetes, preeclampsia or PCOS: no Is patient over 24, have PCOS,  family history of premature CHD under age 66, diabetes, smoke, have hypertension or peripheral artery disease:  no At any time, has a partner hit, kicked or otherwise hurt or frightened you?: no Over the past 2 weeks, have you felt down, depressed or hopeless?: no Over the past 2 weeks, have you felt little interest or pleasure in doing things?:no   Gynecologic History No LMP recorded (lmp unknown). (Menstrual status: Oral contraceptives). Contraception: OCP (estrogen/progesterone) Last Pap: 2018. Results were: normal Last mammogram: 2018. Results were: normal  Obstetric History OB History  Gravida Para Term Preterm AB Living  4 4 4     4   SAB TAB Ectopic Multiple Live Births          4    # Outcome Date GA Lbr Len/2nd Weight Sex Delivery Anes PTL Lv  4 Term 12/08/11    F Vag-Spont   LIV  3 Term 12/17/01    M Vag-Spont   LIV  2 Term 10/08/00    M Vag-Spont   LIV  1 Term 07/27/98    F Vag-Spont   LIV    Obstetric Comments  1st Menstrual Cycle:  10   1st Pregnancy:  16    Past Medical History:  Diagnosis Date  . Abdominal pain    "right lower quadrant pain and right lower back pain  . Anxiety   . Deviated septum 09/2011  . GERD (gastroesophageal reflux disease)   . Headache(784.0)    sinus  . History of echocardiogram    Echo 11/17: EF 55-60, no RWMA, normal diastolic  function  . Hypertension    no meds  . Kidney stones    no current problem  . Migraine   . Nasal turbinate hypertrophy 09/2011   bilat.  . Pelvic inflammatory disease   . Urticaria     Past Surgical History:  Procedure Laterality Date  . CHOLECYSTECTOMY N/A 01/15/2016   Procedure: LAPAROSCOPIC CHOLECYSTECTOMY WITH INTRAOPERATIVE CHOLANGIOGRAM;  Surgeon: Christene Lye, MD;  Location: [facility];  Service: General;  Laterality: N/A;  . COLONOSCOPY  2007   [facility]: pedunculated 30mm polyp in distal sigmoid, sessile 53mm polyp at splenic flexure, larger polyp tubulovillous adenoma and smaller polyp tubular adenoma   . COLONOSCOPY  09-09-15  . COLONOSCOPY WITH PROPOFOL N/A 03/01/2016   Procedure: COLONOSCOPY WITH PROPOFOL;  Surgeon: Garlan Fair, MD;  Location: [facility];  Service: Endoscopy;  Laterality: N/A;  . NASAL SEPTOPLASTY W/ TURBINOPLASTY  10/04/2011   Procedure: NASAL SEPTOPLASTY WITH TURBINATE REDUCTION;  Surgeon: Ascencion Dike, MD;  Location: [place];  Service: ENT;  Laterality: Bilateral;  . WISDOM TOOTH EXTRACTION  2009     Current Outpatient Medications:  .  acetaminophen (TYLENOL) 500 MG tablet, Take 1 tablet (500 mg total) by mouth every 6 (six) hours as needed. (Patient taking differently: Take 1,000 mg by  mouth every 8 (eight) hours as needed. ), Disp: 30 tablet, Rfl: 0 .  azithromycin (ZITHROMAX) 250 MG tablet, Take 1 tablet (250 mg total) by mouth daily. Take 1 tablet daily for 6 days, Disp: 6 tablet, Rfl: 0 .  cholecalciferol (VITAMIN D) 1000 units tablet, Take 1,000 Units by mouth daily., Disp: , Rfl:  .  clonazePAM (KLONOPIN) 0.5 MG tablet, Take 0.25 mg by mouth daily. , Disp: , Rfl:  .  fluconazole (DIFLUCAN) 150 MG tablet, TAKE 1 TABLET BY MOUTH AS A ONE TIME DOSE, Disp: 1 tablet, Rfl: 0 .  fluticasone (FLONASE) 50 MCG/ACT nasal spray, Place 2 sprays into both nostrils daily as needed for allergies or rhinitis., Disp: , Rfl:  .  ibuprofen  (ADVIL,MOTRIN) 800 MG tablet, TAKE ONE TABLET BY MOUTH EVERY 8 HOURS, Disp: 30 tablet, Rfl: 5 .  Levonorgest-Eth Estrad-Fe Bisg (BALCOLTRA) 0.1-20 MG-MCG(21) TABS, Take 1 tablet by mouth daily., Disp: 28 tablet, Rfl: 11 .  loratadine (CLARITIN) 10 MG tablet, Take 10 mg by mouth daily., Disp: , Rfl:  .  montelukast (SINGULAIR) 10 MG tablet, Take 10 mg by mouth at bedtime., Disp: , Rfl:  .  Multiple Vitamin (MULTIVITAMIN) tablet, Take 1 tablet by mouth daily., Disp: , Rfl:  .  Multiple Vitamins-Minerals (MULTIVITAMIN PO), Take 1 tablet by mouth every morning., Disp: , Rfl:  .  Olopatadine HCl (PATANASE) 0.6 % SOLN, Place 2 drops (2 puffs total) into both nostrils 2 (two) times daily., Disp: 30.5 g, Rfl: 5 .  omeprazole (PRILOSEC) 40 MG capsule, Take 40 mg by mouth daily., Disp: , Rfl:  .  ondansetron (ZOFRAN ODT) 4 MG disintegrating tablet, 4mg  ODT q4 hours prn nausea/vomit, Disp: 8 tablet, Rfl: 0 .  potassium chloride SA (K-DUR,KLOR-CON) 20 MEQ tablet, Take by mouth., Disp: , Rfl:  .  vitamin B-12 (CYANOCOBALAMIN) 1000 MCG tablet, Take 1,000 mcg by mouth daily., Disp: , Rfl:  Allergies  Allergen Reactions  . Clarithromycin Hives and Itching  . Doxycycline Shortness Of Breath    Chest pain  . Bentyl [Dicyclomine Hcl] Other (See Comments)    DIZZINESS  . Haldol [Haloperidol] Other (See Comments)    Made her feel very jittery and agitated  . Meclizine     Chest pain  . Morphine And Related Other (See Comments)    Made her feel like she was losing her mind  . Toradol [Ketorolac Tromethamine] Other (See Comments)    Made her feel very jittery and agitated  . Macrobid [Nitrofurantoin Monohyd Macro] Rash  . Sulfa Antibiotics Hives and Rash    Social History   Tobacco Use  . Smoking status: Former Smoker    Packs/day: 0.25    Years: 10.00    Pack years: 2.50    Last attempt to quit: 08/23/2008    Years since quitting: 9.1  . Smokeless tobacco: Never Used  Substance Use Topics  . Alcohol  use: Yes    Alcohol/week: 0.0 oz    Comment: ocassionally    Family History  Problem Relation Age of Onset  . Kidney cancer Mother   . Hypertension Mother   . Migraines Mother   . Multiple sclerosis Mother   . Fibromyalgia Mother   . Liver disease Mother        NASH   . Hypertension Father   . Liver disease Father        liver transplant; had fatty liver disease  . Spina bifida Brother   . Migraines Maternal Grandmother   .  Hypertension Other   . Colon cancer Paternal Grandfather   . Allergic rhinitis Daughter   . Asthma Daughter   . Allergic rhinitis Son   . Asthma Son   . Angioedema Neg Hx   . Eczema Neg Hx   . Immunodeficiency Neg Hx   . Urticaria Neg Hx       Review of Systems  Constitutional: negative for fatigue and weight loss Respiratory: negative for cough and wheezing Cardiovascular: negative for chest pain, fatigue and palpitations Gastrointestinal: negative for abdominal pain and change in bowel habits Musculoskeletal:negative for myalgias Neurological: negative for gait problems and tremors Behavioral/Psych: negative for abusive relationship, depression Endocrine: negative for temperature intolerance    Genitourinary:negative for abnormal menstrual periods, genital lesions, hot flashes, sexual problems and vaginal discharge Integument/breast: negative for breast lump, breast tenderness, nipple discharge and skin lesion(s)    Objective:       BP 134/90   Pulse 95   Ht 5\' 2"  (1.575 m)   Wt 183 lb 12.8 oz (83.4 kg)   LMP  (LMP Unknown)   BMI 33.62 kg/m  General:   alert  Skin:   no rash or abnormalities  Lungs:   clear to auscultation bilaterally  Heart:   regular rate and rhythm, S1, S2 normal, no murmur, click, rub or gallop  Breasts:   normal without suspicious masses, skin or nipple changes or axillary nodes  Abdomen:  normal findings: no organomegaly, soft, non-tender and no hernia  Pelvis:  External genitalia: normal general  appearance Urinary system: urethral meatus normal and bladder without fullness, nontender Vaginal: normal without tenderness, induration or masses Cervix: normal appearance Adnexa: normal bimanual exam Uterus: anteverted and non-tender, normal size   Lab Review Urine pregnancy test Labs reviewed yes Radiologic studies reviewed yes  50% of 20 min visit spent on counseling and coordination of care.   Assessment:     1. Encounter for routine gynecological examination with Papanicolaou smear of cervix Rx: - Cytology - PAP  2. Vaginal discharge Rx: - Cervicovaginal ancillary only  3. 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 Encounter for other general counseling and advice on contraception - change in OCP's to different pill because of breast tenderness Rx: - Levonorgest-Eth Estrad-Fe Bisg (BALCOLTRA) 0.1-20 MG-MCG(21) TABS; Take 1 tablet by mouth daily.  Dispense: 28 tablet; Refill: 11   Plan:    Education reviewed: calcium supplements, depression evaluation, low fat, low cholesterol diet, safe sex/STD prevention, self breast exams, skin cancer screening and weight bearing exercise. Contraception: OCP (estrogen/progesterone). Follow up in: 1 year.   Meds ordered this encounter  Medications  . Levonorgest-Eth Estrad-Fe Bisg (BALCOLTRA) 0.1-20 MG-MCG(21) TABS    Sig: Take 1 tablet by mouth daily.    Dispense:  28 tablet    Refill:  11   No orders of the defined types were placed in this encounter.   Shelly Bombard MD 10-03-2017

## 2017-10-03 NOTE — Progress Notes (Signed)
Presents for AEX/PAP, wants cultures.  C/o of breast soreness 7/10 x 2 months. Low energy.  She is currently on Rx for depression/anxiety.

## 2017-10-04 ENCOUNTER — Encounter: Payer: Self-pay | Admitting: Obstetrics

## 2017-10-04 LAB — CERVICOVAGINAL ANCILLARY ONLY
Bacterial vaginitis: NEGATIVE
Candida vaginitis: POSITIVE — AB
Chlamydia: NEGATIVE
Neisseria Gonorrhea: NEGATIVE
TRICH (WINDOWPATH): NEGATIVE

## 2017-10-05 ENCOUNTER — Other Ambulatory Visit: Payer: Self-pay | Admitting: Obstetrics

## 2017-10-05 DIAGNOSIS — B3731 Acute candidiasis of vulva and vagina: Secondary | ICD-10-CM

## 2017-10-05 DIAGNOSIS — B373 Candidiasis of vulva and vagina: Secondary | ICD-10-CM

## 2017-10-05 DIAGNOSIS — E569 Vitamin deficiency, unspecified: Secondary | ICD-10-CM

## 2017-10-05 LAB — CYTOLOGY - PAP
Diagnosis: NEGATIVE
HPV (WINDOPATH): NOT DETECTED

## 2017-10-05 MED ORDER — FLUCONAZOLE 150 MG PO TABS: ORAL_TABLET | ORAL | 2 refills | Status: DC

## 2017-10-05 MED ORDER — VITAFOL ULTRA 29-0.6-0.4-200 MG PO CAPS: 1.0000 | ORAL_CAPSULE | Freq: Every day | ORAL | 11 refills | Status: DC

## 2017-10-05 NOTE — Progress Notes (Signed)
Patient notified via My Chart

## 2017-10-15 ENCOUNTER — Emergency Department (HOSPITAL_BASED_OUTPATIENT_CLINIC_OR_DEPARTMENT_OTHER): Payer: Medicaid Other

## 2017-10-15 ENCOUNTER — Encounter (HOSPITAL_BASED_OUTPATIENT_CLINIC_OR_DEPARTMENT_OTHER): Payer: Self-pay | Admitting: Emergency Medicine

## 2017-10-15 ENCOUNTER — Other Ambulatory Visit: Payer: Self-pay

## 2017-10-15 ENCOUNTER — Emergency Department (HOSPITAL_BASED_OUTPATIENT_CLINIC_OR_DEPARTMENT_OTHER)
Admission: EM | Admit: 2017-10-15 | Discharge: 2017-10-15 | Disposition: A | Discharge status: Home / Self Care | Payer: Medicaid Other | Attending: Emergency Medicine | Admitting: Emergency Medicine

## 2017-10-15 DIAGNOSIS — Z9049 Acquired absence of other specified parts of digestive tract: Secondary | ICD-10-CM | POA: Insufficient documentation

## 2017-10-15 DIAGNOSIS — I10 Essential (primary) hypertension: Secondary | ICD-10-CM | POA: Diagnosis not present

## 2017-10-15 DIAGNOSIS — R310 Gross hematuria: Secondary | ICD-10-CM | POA: Diagnosis not present

## 2017-10-15 DIAGNOSIS — Z87891 Personal history of nicotine dependence: Secondary | ICD-10-CM | POA: Diagnosis not present

## 2017-10-15 DIAGNOSIS — R1030 Lower abdominal pain, unspecified: Secondary | ICD-10-CM | POA: Diagnosis present

## 2017-10-15 DIAGNOSIS — Z79899 Other long term (current) drug therapy: Secondary | ICD-10-CM | POA: Insufficient documentation

## 2017-10-15 DIAGNOSIS — F419 Anxiety disorder, unspecified: Secondary | ICD-10-CM | POA: Diagnosis not present

## 2017-10-15 LAB — CBC WITH DIFFERENTIAL/PLATELET
BASOS PCT: 1 %
Basophils Absolute: 0 10*3/uL (ref 0.0–0.1)
EOS ABS: 0.4 10*3/uL (ref 0.0–0.7)
Eosinophils Relative: 5 %
HCT: 39.2 % (ref 36.0–46.0)
Hemoglobin: 13.6 g/dL (ref 12.0–15.0)
LYMPHS ABS: 1.8 10*3/uL (ref 0.7–4.0)
Lymphocytes Relative: 25 %
MCH: 30.9 pg (ref 26.0–34.0)
MCHC: 34.7 g/dL (ref 30.0–36.0)
MCV: 89.1 fL (ref 78.0–100.0)
MONOS PCT: 9 %
Monocytes Absolute: 0.7 10*3/uL (ref 0.1–1.0)
Neutro Abs: 4.5 10*3/uL (ref 1.7–7.7)
Neutrophils Relative %: 60 %
Platelets: 328 10*3/uL (ref 150–400)
RBC: 4.4 MIL/uL (ref 3.87–5.11)
RDW: 12.4 % (ref 11.5–15.5)
WBC: 7.4 10*3/uL (ref 4.0–10.5)

## 2017-10-15 LAB — BASIC METABOLIC PANEL
Anion gap: 8 (ref 5–15)
BUN: 10 mg/dL (ref 6–20)
CALCIUM: 8.8 mg/dL — AB (ref 8.9–10.3)
CHLORIDE: 105 mmol/L (ref 101–111)
CO2: 24 mmol/L (ref 22–32)
CREATININE: 0.66 mg/dL (ref 0.44–1.00)
GFR calc non Af Amer: >60 mL/min (ref >60)
Glucose, Bld: 90 mg/dL (ref 65–99)
Potassium: 3.9 mmol/L (ref 3.5–5.1)
SODIUM: 137 mmol/L (ref 135–145)

## 2017-10-15 LAB — URINALYSIS, MICROSCOPIC (REFLEX)

## 2017-10-15 LAB — URINALYSIS, ROUTINE W REFLEX MICROSCOPIC
Bilirubin Urine: NEGATIVE
GLUCOSE, UA: NEGATIVE mg/dL
KETONES UR: NEGATIVE mg/dL
LEUKOCYTES UA: NEGATIVE
NITRITE: NEGATIVE
PROTEIN: NEGATIVE mg/dL
Specific Gravity, Urine: 1.01 (ref 1.005–1.030)
pH: 6 (ref 5.0–8.0)

## 2017-10-15 LAB — PREGNANCY, URINE: PREG TEST UR: NEGATIVE

## 2017-10-15 NOTE — ED Provider Notes (Signed)
[facility] EMERGENCY DEPARTMENT Provider Note   CSN: [id number] Arrival date & time: 10/15/17  1541     History   Chief Complaint Chief Complaint  Patient presents with  . Hematuria    HPI Christina Fox is a 37 y.o. female.  37 yo F with a chief complaint of blood in her urine.  This been going on for the past couple days.  She has had some flank pain that comes and goes as well as some lower abdominal cramping.  She denies nausea or vomiting.  She went to urgent care and they told her she needed a imaging study and sent her to the emergency department for further evaluation.  She denies fevers or chills.  Is currently having a mild ache.  Denies dysuria increased frequency or hesitancy.  Denies vaginal bleeding or discharge.  She recently was seen by the OB/GYN and had a normal workup.  She also was recently seen her gastroenterologist who thought this is more of a kidney stone type pain.  She has seen her urologist recently as well.  She is concerned that the medical professionals are missing something because she had an undiagnosed gallstones for over a year.  The history is provided by the patient.  Abdominal Pain   This is a new problem. The current episode started yesterday. The problem occurs constantly. The problem has not changed since onset.The pain is located in the suprapubic region. The quality of the pain is shooting. The pain is at a severity of 8/10. The pain is severe. Pertinent negatives include fever, nausea, vomiting, dysuria, headaches, arthralgias and myalgias. Nothing aggravates the symptoms. Nothing relieves the symptoms.    Past Medical History:  Diagnosis Date  . Abdominal pain    "right lower quadrant pain and right lower back pain  . Anxiety   . Deviated septum 09/2011  . GERD (gastroesophageal reflux disease)   . Headache(784.0)    sinus  . History of echocardiogram    Echo 11/17: EF 55-60, no RWMA, normal diastolic function  .  Hypertension    no meds  . Kidney stones    no current problem  . Migraine   . Nasal turbinate hypertrophy 09/2011   bilat.  . Pelvic inflammatory disease   . Urticaria     Patient Active Problem List   Diagnosis Date Noted  . Gallstone 01/07/2016  . History of colonic polyps 11/27/2015  . Right lower quadrant abdominal pain 11/27/2015  . HTN (hypertension), benign 11/24/2015  . Abdominal pain 11/16/2015  . Headache 10/13/2015  . 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 Anxiety disorder 11/03/2014    Past Surgical History:  Procedure Laterality Date  . CHOLECYSTECTOMY N/A 01/15/2016   Procedure: LAPAROSCOPIC CHOLECYSTECTOMY WITH INTRAOPERATIVE CHOLANGIOGRAM;  Surgeon: Christene Lye, MD;  Location: [facility]S;  Service: General;  Laterality: N/A;  . COLONOSCOPY  2007   [facility]: pedunculated 15mm polyp in distal sigmoid, sessile 62mm polyp at splenic flexure, larger polyp tubulovillous adenoma and smaller polyp tubular adenoma   . COLONOSCOPY  09-09-15  . COLONOSCOPY WITH PROPOFOL N/A 03/01/2016   Procedure: COLONOSCOPY WITH PROPOFOL;  Surgeon: Garlan Fair, MD;  Location: [facility];  Service: Endoscopy;  Laterality: N/A;  . NASAL SEPTOPLASTY W/ TURBINOPLASTY  10/04/2011   Procedure: NASAL SEPTOPLASTY WITH TURBINATE REDUCTION;  Surgeon: Ascencion Dike, MD;  Location: [place] [place];  Service: ENT;  Laterality: Bilateral;  . [place] EXTRACTION  2009     OB History  Gravida  4   Para  4   Term  4   Preterm      AB      Living  4     SAB      TAB      Ectopic      Multiple      Live Births  4        Obstetric Comments  1st Menstrual Cycle:  10  1st Pregnancy:  16          Home Medications    Prior to Admission medications   Medication Sig Start Date End Date Taking? Authorizing Provider  ascorbic acid (VITAMIN C) 500 MG tablet Take 500 mg by mouth daily.   Yes [provider]  cholecalciferol (VITAMIN D) 1000 units tablet Take  1,000 Units by mouth daily.   Yes [provider]  clonazePAM (KLONOPIN) 0.5 MG tablet Take 0.25 mg by mouth daily.    Yes [provider]  fluconazole (DIFLUCAN) 150 MG tablet TAKE 1 TABLET BY MOUTH AS A ONE TIME DOSE 10/05/17  Yes Shelly Bombard, MD  fluticasone [facility]) 50 MCG/ACT nasal spray Place 2 sprays into both nostrils daily as needed for allergies or rhinitis.   Yes [provider]  ibuprofen (ADVIL,MOTRIN) 800 MG tablet TAKE ONE TABLET BY MOUTH EVERY 8 HOURS 02/22/17  Yes Shelly Bombard, MD  Levonorgest-Eth Estrad-Fe Bisg (BALCOLTRA) 0.1-20 MG-MCG(21) TABS Take 1 tablet by mouth daily. 10/03/17  Yes Shelly Bombard, MD  loratadine (CLARITIN) 10 MG tablet Take 10 mg by mouth daily.   Yes [provider]  montelukast (SINGULAIR) 10 MG tablet Take 10 mg by mouth at bedtime.   Yes [provider]  omeprazole (PRILOSEC) 40 MG capsule Take 40 mg by mouth daily.   Yes [provider]  potassium chloride SA (K-DUR,KLOR-CON) 20 MEQ tablet Take by mouth. 10/31/16  Yes [provider]  Prenat-Fe Poly-Methfol-FA-DHA (VITAFOL ULTRA) 29-0.6-0.4-200 MG CAPS Take 1 capsule by mouth daily before breakfast. 10/05/17  Yes Shelly Bombard, MD  SUPER B COMPLEX/C PO Take by mouth.   Yes [provider]  acetaminophen (TYLENOL) 500 MG tablet Take 1 tablet (500 mg total) by mouth every 6 (six) hours as needed. Patient taking differently: Take 1,000 mg by mouth every 8 (eight) hours as needed.  08/31/16   Waynetta Pean, PA-C  azithromycin (ZITHROMAX) 250 MG tablet Take 1 tablet (250 mg total) by mouth daily. Take 1 tablet daily for 6 days 06/06/17   Jacqlyn Larsen, PA-C  Multiple Vitamin (MULTIVITAMIN) tablet Take 1 tablet by mouth daily.    [provider]  Multiple Vitamins-Minerals (MULTIVITAMIN PO) Take 1 tablet by mouth every morning.    [provider]  Olopatadine HCl (PATANASE) 0.6 % SOLN Place 2 drops (2 puffs  total) into both nostrils 2 (two) times daily. 11/03/16   Kennith Gain, MD  ondansetron (ZOFRAN ODT) 4 MG disintegrating tablet 4mg  ODT q4 hours prn nausea/vomit 06/06/17   Jacqlyn Larsen, PA-C  vitamin B-12 (CYANOCOBALAMIN) 1000 MCG tablet Take 1,000 mcg by mouth daily.    [provider]  escitalopram (LEXAPRO) 10 MG tablet Take 10 mg by mouth daily.    09/02/11  [provider]    Family History Family History  Problem Relation Age of Onset  . Kidney cancer Mother   . Hypertension Mother   . Migraines Mother   . Multiple sclerosis Mother   .  Fibromyalgia Mother   . Liver disease Mother        NASH   . Hypertension Father   . Liver disease Father        liver transplant; had fatty liver disease  . Spina bifida Brother   . Migraines Maternal Grandmother   . Hypertension Other   . Colon cancer Paternal Grandfather   . Allergic rhinitis Daughter   . Asthma Daughter   . Allergic rhinitis Son   . Asthma Son   . Angioedema Neg Hx   . Eczema Neg Hx   . Immunodeficiency Neg Hx   . Urticaria Neg Hx     Social History Social History   Tobacco Use  . Smoking status: Former Smoker    Packs/day: 0.25    Years: 10.00    Pack years: 2.50    Last attempt to quit: 08/23/2008    Years since quitting: 9.1  . Smokeless tobacco: Never Used  Substance Use Topics  . Alcohol use: Yes    Alcohol/week: 0.0 oz    Comment: ocassionally  . Drug use: No     Allergies   Clarithromycin; Doxycycline; Bentyl [dicyclomine hcl]; Haldol [haloperidol]; Meclizine; Morphine and related; Toradol [ketorolac tromethamine]; Macrobid [nitrofurantoin monohyd macro]; and Sulfa antibiotics   Review of Systems Review of Systems  Constitutional: Negative for chills and fever.  HENT: Negative for congestion and rhinorrhea.   Eyes: Negative for redness and visual disturbance.  Respiratory: Negative for shortness of breath and wheezing.   Cardiovascular: Negative for chest pain  and palpitations.  Gastrointestinal: Positive for abdominal pain. Negative for nausea and vomiting.  Genitourinary: Negative for dysuria and urgency.  Musculoskeletal: Negative for arthralgias and myalgias.  Skin: Negative for pallor and wound.  Neurological: Negative for dizziness and headaches.     Physical Exam Updated Vital Signs BP (!) 162/94 (BP Location: Left Arm)   Pulse 87   Temp 98.5 F (36.9 C)   Resp 18   Ht 5\' 2"  (1.575 m)   Wt 83 kg (183 lb)   LMP  (LMP Unknown)   SpO2 100%   BMI 33.47 kg/m   Physical Exam  Constitutional: She is oriented to person, place, and time. She appears well-developed and well-nourished. No distress.  HENT:  Head: Normocephalic and atraumatic.  Eyes: Pupils are equal, round, and reactive to light. EOM are normal.  Neck: Normal range of motion. Neck supple.  Cardiovascular: Normal rate and regular rhythm. Exam reveals no gallop and no friction rub.  No murmur heard. Pulmonary/Chest: Effort normal. She has no wheezes. She has no rales.  Abdominal: Soft. She exhibits no distension and no mass. There is no tenderness. There is no guarding.  Very mild discomfort to the lower abdomen  Musculoskeletal: She exhibits no edema or tenderness.  Neurological: She is alert and oriented to person, place, and time.  Skin: Skin is warm and dry. She is not diaphoretic.  Psychiatric: She has a normal mood and affect. Her behavior is normal.  Nursing note and vitals reviewed.    ED Treatments / Results  Labs (all labs ordered are listed, but only abnormal results are displayed) Labs Reviewed  URINALYSIS, ROUTINE W REFLEX MICROSCOPIC - Abnormal; Notable for the following components:      Result Value   Hgb urine dipstick SMALL (*)    All other components within normal limits  URINALYSIS, MICROSCOPIC (REFLEX) - Abnormal; Notable for the following components:   Bacteria, UA RARE (*)    Squamous  Epithelial / LPF 0-5 (*)    All other components  within normal limits  BASIC METABOLIC PANEL - Abnormal; Notable for the following components:   Calcium 8.8 (*)    All other components within normal limits  PREGNANCY, URINE  CBC WITH DIFFERENTIAL/PLATELET    EKG None  Radiology Ct Renal Stone Study  Result Date: 10/15/2017 CLINICAL DATA:  Bilateral flank pain with hematuria EXAM: CT ABDOMEN AND PELVIS WITHOUT CONTRAST TECHNIQUE: Multidetector CT imaging of the abdomen and pelvis was performed following the standard protocol without IV contrast. COMPARISON:  06/06/2017 FINDINGS: Lower chest: No acute abnormality. Hepatobiliary: No focal liver abnormality is seen. Status post cholecystectomy. No biliary dilatation. Pancreas: Unremarkable. No pancreatic ductal dilatation or surrounding inflammatory changes. Spleen: Normal in size without focal abnormality. Adrenals/Urinary Tract: Adrenal glands are within normal limits. The kidneys are well visualized bilaterally. The left kidney demonstrates tiny lower pole nonobstructing renal stone. No obstructive changes are seen. The ureter is unremarkable. The bladder is decompressed. The right kidney also demonstrates small nonobstructing stones without hydronephrosis or hydroureter. No ureteral stones are seen. Stomach/Bowel: Stomach is within normal limits. Appendix appears normal. No evidence of bowel wall thickening, distention, or inflammatory changes. Vascular/Lymphatic: No significant vascular findings are present. No enlarged abdominal or pelvic lymph nodes. Reproductive: Uterus and bilateral adnexa are unremarkable. Other: No abdominal wall hernia or abnormality. No abdominopelvic ascites. Musculoskeletal: No acute or significant osseous findings. IMPRESSION: Small bilateral nonobstructing renal stones. Electronically Signed   By: Inez Catalina M.D.   On: 10/15/2017 17:15    Procedures Procedures (including critical care time)  Medications Ordered in ED Medications - No data to display   Initial  Impression / Assessment and Plan / ED Course  I have reviewed the triage vital signs and the nursing notes.  Pertinent labs & imaging results that were available during my care of the patient were reviewed by me and considered in my medical decision making (see chart for details).     37 yo F with a chief complaint of hematuria.  I discussed the risks and benefits of imaging and laboratory evaluation.  Patient at this point is insistent that something is wrong and she like to have imaging performed.  CT scan with no noted cause for her abdominal pain.  She does have some stones inside the kidney but not in the ureter.  We will have her follow-up with her urologist.  5:55 PM:  I have discussed the diagnosis/risks/treatment options with the patient and believe the pt to be eligible for discharge home to follow-up with Urology. We also discussed returning to the ED immediately if new or worsening sx occur. We discussed the sx which are most concerning (e.g., sudden worsening pain, fever, inability to tolerate by mouth) that necessitate immediate return. Medications administered to the patient during their visit and any new prescriptions provided to the patient are listed below.  Medications given during this visit Medications - No data to display  Labs reviewed cbc, bmp, ua, hematuria without infection, cbc, bmp unremarkable Old records reviewed recently seen at urgent care and sent to the ED.  Seen 3 days ago at [facility] with some urinary symptoms, UA negative for infection on dipstick.  The patient appears reasonably screen and/or stabilized for discharge and I doubt any other medical condition or other [facility] requiring further screening, evaluation, or treatment in the ED at this time prior to discharge.    Final Clinical Impressions(s) / ED Diagnoses   Final  diagnoses:  Gross hematuria    ED Discharge Orders    None       Deno Etienne, [place] 10/15/17 1755

## 2017-10-15 NOTE — ED Notes (Signed)
Patient transported to CT 

## 2017-10-15 NOTE — ED Notes (Signed)
ED Provider at bedside. 

## 2017-10-15 NOTE — ED Triage Notes (Signed)
Patient states that she went to her regular dr on wed and was found to have a UTI and blood in her urine with some back pain. The patient reports that she is now having chunks of blood and the pain is worsening, she went to urgent care and they told her to come here

## 2017-10-15 NOTE — Discharge Instructions (Signed)
Your CT scan was normal.  Follow up with your urologist and discuss this with them, they may choose to do further studies.

## 2017-11-16 ENCOUNTER — Encounter: Payer: Self-pay | Admitting: Family Medicine

## 2017-11-21 ENCOUNTER — Telehealth: Payer: Self-pay | Admitting: Physician Assistant

## 2017-11-21 NOTE — Telephone Encounter (Signed)
Called patient back. Patient is not having active chest pain right now. Patient stated she had an episode yesterday after taking a new anxiety medication. Patient had myoview done two years ago that was at low risk. Patient was to follow up as needed due to all test results normal. Per Richardson Dopp PA about test results of Myoview, low risk study, likely not the cause of chest pain. Patient stated she has seen GI as well for her chest pain, and they have not found anything. Informed patient to go to the ED for active chest pain, and to call the doctor who prescribed her the anxiety medication or her PCP about her symptoms after taking this medication.  Patient has been to ED several times for chest pain that resulted in no acute findings. Last ED visit was 09/16/17. Patient was suppose to follow up with cardiology. Patient did not attempt to follow up with cardiology at that time. Will forward to [employer] PA for further advisement.

## 2017-11-21 NOTE — Telephone Encounter (Signed)
Called patient about scheduling her an appointment for further evaluation. Patient will see Mare Loan PA on Thursday.

## 2017-11-21 NOTE — Telephone Encounter (Signed)
New Message   Pt c/o of Chest Pain: STAT if CP now or developed within 24 hours  1. Are you having CP right now? No   2. Are you experiencing any other symptoms (ex. SOB, nausea, vomiting, sweating)?  Sweating and some diarrhea  3. How long have you been experiencing CP?  Since yesterday but it stopped this morning    4. Is your CP continuous or coming and going? Coming and going   5. Have you taken Nitroglycerin? No  ?

## 2017-11-21 NOTE — Telephone Encounter (Signed)
Call came to me from the operators. Pt c/o chest pain. This call should had been routed to Triage so that a Triage nurse may call pt and further evaluate pt's symptoms. I will route to Triage for further evaluation.

## 2017-11-21 NOTE — Telephone Encounter (Signed)
If patient has severe or worsening chest pain, she should go to the ED. Agree with discussing new medication with prescribing provider of new anxiety medication. She will need a follow up appointment here to evaluate further. Richardson Dopp, PA-C    11/21/2017 12:37 PM

## 2017-11-22 NOTE — Progress Notes (Deleted)
Cardiology Office Note:    Date:  11/23/2017   ID:  Christina Fox, DOB 1980/12/14, MRN [id number]  PCP:  Med[facility], [facility]  Cardiologist:  Will Meredith Leeds, MD   Referring MD: Medicine, [place]*   No chief complaint on file. ***  History of Present Illness:    Christina Fox is a 37 y.o. female with a hx of GERD, anxiety/panic attacks, and PID.   She was recently seen by her PCP 09/13/17 for sinusitis and chest wall discomfort. Symptomatic control was recommended for her sinusitis. No workup for her chest wall discomfort as this was worse with deep inspiration and worse with moving her left arm - suspected MSK origin. She was then seen in the urgent care with chest pain (09/16/17). EKG there was non-acute. She stated the pain did not feel like her GERD or costochondritis. She was then referred to the [facility]. In the ER, EKG was non-acute and troponin negative for 3-day history of chest wall pain. She was discharged. She was seen back at Urgent Care on 10/12/17 for dysuria. She was seen back in the ED on 10/15/17 for hematuria and abdominal pain. Workup was negative and she was discharged.   She called or office 11/21/17 with complaints of chest pain and was advised to go to the ED if she persistent chest pain. She presents today for follow up of her chest pain. She had a normal stress test 02/02/17. She was seen in the office on 06/22/16 by Richardson Dopp PAC.         Past Medical History:  Diagnosis Date  . Abdominal pain    "right lower quadrant pain and right lower back pain  . Anxiety   . Deviated septum 09/2011  . GERD (gastroesophageal reflux disease)   . Headache(784.0)    sinus  . History of echocardiogram    Echo 11/17: EF 55-60, no RWMA, normal diastolic function  . Hypertension    no meds  . Kidney stones    no current problem  . Migraine   . Nasal turbinate hypertrophy 09/2011   bilat.  . Pelvic inflammatory disease   . Urticaria      Past Surgical History:  Procedure Laterality Date  . CHOLECYSTECTOMY N/A 01/15/2016   Procedure: LAPAROSCOPIC CHOLECYSTECTOMY WITH INTRAOPERATIVE CHOLANGIOGRAM;  Surgeon: Christene Lye, MD;  Location: [facility];  Service: General;  Laterality: N/A;  . COLONOSCOPY  2007   [facility]: pedunculated 55mm polyp in distal sigmoid, sessile 48mm polyp at splenic flexure, larger polyp tubulovillous adenoma and smaller polyp tubular adenoma   . COLONOSCOPY  09-09-15  . COLONOSCOPY WITH PROPOFOL N/A 03/01/2016   Procedure: COLONOSCOPY WITH PROPOFOL;  Surgeon: Garlan Fair, MD;  Location: [facility];  Service: Endoscopy;  Laterality: N/A;  . NASAL SEPTOPLASTY W/ TURBINOPLASTY  10/04/2011   Procedure: NASAL SEPTOPLASTY WITH TURBINATE REDUCTION;  Surgeon: Ascencion Dike, MD;  Location: [facility];  Service: ENT;  Laterality: Bilateral;  . WISDOM TOOTH EXTRACTION  2009    Current Medications: No outpatient medications have been marked as taking for the 11/23/17 encounter (Appointment) with Consuelo Pandy, PA-C.     Allergies:   Clarithromycin; Doxycycline; Bentyl [dicyclomine hcl]; Haldol [haloperidol]; Meclizine; Morphine and related; Toradol [ketorolac tromethamine]; Macrobid [nitrofurantoin monohyd macro]; and Sulfa antibiotics   Social History   Socioeconomic History  . Marital status: Single    Spouse name: Not on file  . Number of children: 4  .  Years of education: 15  . Highest education level: Not on file  Occupational History  . Occupation: Arts development officer @ [employer]  Social Needs  . Financial resource strain: Not on file  . Food insecurity:    Worry: Not on file    Inability: Not on file  . Transportation needs:    Medical: Not on file    Non-medical: Not on file  Tobacco Use  . Smoking status: Former Smoker    Packs/day: 0.25    Years: 10.00    Pack years: 2.50    Last attempt to quit: 08/23/2008    Years since quitting: 9.2  . Smokeless tobacco:  Never Used  Substance and Sexual Activity  . Alcohol use: Yes    Alcohol/week: 0.0 oz    Comment: ocassionally  . Drug use: No  . Sexual activity: Not Currently    Birth control/protection: Pill  Lifestyle  . Physical activity:    Days per week: Not on file    Minutes per session: Not on file  . Stress: Not on file  Relationships  . Social connections:    Talks on phone: Not on file    Gets together: Not on file    Attends religious service: Not on file    Active member of club or organization: Not on file    Attends meetings of clubs or organizations: Not on file    Relationship status: Not on file  Other Topics Concern  . Not on file  Social History Narrative   Lives at home w/ her children   Right-handed   Drinks occasional sweet tea     Family History: The patient's ***family history includes Allergic rhinitis in her daughter and son; Asthma in her daughter and son; Colon cancer in her paternal grandfather; Fibromyalgia in her mother; Hypertension in her father, mother, and other; Kidney cancer in her mother; Liver disease in her father and mother; Migraines in her maternal grandmother and mother; Multiple sclerosis in her mother; Spina bifida in her brother. There is no history of Angioedema, Eczema, Immunodeficiency, or Urticaria.  ROS:   Please see the history of present illness.    *** All other systems reviewed and are negative.  EKGs/Labs/Other Studies Reviewed:    The following studies were reviewed today: ***  EKG:  EKG is *** ordered today.  The ekg ordered today demonstrates ***  Recent Labs: 03/02/2017: TSH 1.940 06/06/2017: ALT 15 10/15/2017: BUN 10; Creatinine, Ser 0.66; Hemoglobin 13.6; Platelets 328; Potassium 3.9; Sodium 137  Recent Lipid Panel    Component Value Date/Time   CHOL 153 12/22/2015 1646   TRIG 99 12/22/2015 1646   HDL 59 12/22/2015 1646   CHOLHDL 2.6 12/22/2015 1646   VLDL 20 12/22/2015 1646   LDLCALC 74 12/22/2015 1646     Physical Exam:    VS:  There were no vitals taken for this visit.    Wt Readings from Last 3 Encounters:  10/15/17 183 lb (83 kg)  10/03/17 183 lb 12.8 oz (83.4 kg)  09/16/17 184 lb (83.5 kg)     GEN: *** Well nourished, well developed in no acute distress HEENT: Normal NECK: No JVD; No carotid bruits LYMPHATICS: No lymphadenopathy CARDIAC: ***RRR, no murmurs, rubs, gallops RESPIRATORY:  Clear to auscultation without rales, wheezing or rhonchi  ABDOMEN: Soft, non-tender, non-distended MUSCULOSKELETAL:  No edema; No deformity  SKIN: Warm and dry NEUROLOGIC:  Alert and oriented x 3 PSYCHIATRIC:  Normal affect   ASSESSMENT:  No diagnosis found. PLAN:    In order of problems listed above:  No diagnosis found.   Medication Adjustments/Labs and Tests Ordered: Current medicines are reviewed at length with the patient today.  Concerns regarding medicines are outlined above.  No orders of the defined types were placed in this encounter.  No orders of the defined types were placed in this encounter.   Signed, Ledora Bottcher, [place]  11/23/2017 7:46 AM    [facility]

## 2017-11-23 ENCOUNTER — Ambulatory Visit: Payer: Self-pay | Admitting: Cardiology

## 2017-11-24 ENCOUNTER — Encounter: Payer: Self-pay | Admitting: Cardiology

## 2017-11-25 IMAGING — DX DG CHEST 2V
2 series · 2 of 2 positions shown · non-contrast
Comparison: December 01, 2015

CLINICAL DATA: Cough and shortness of breath. Cholecystectomy 1
week prior

EXAM:
CHEST  2 VIEW

[chest pa]
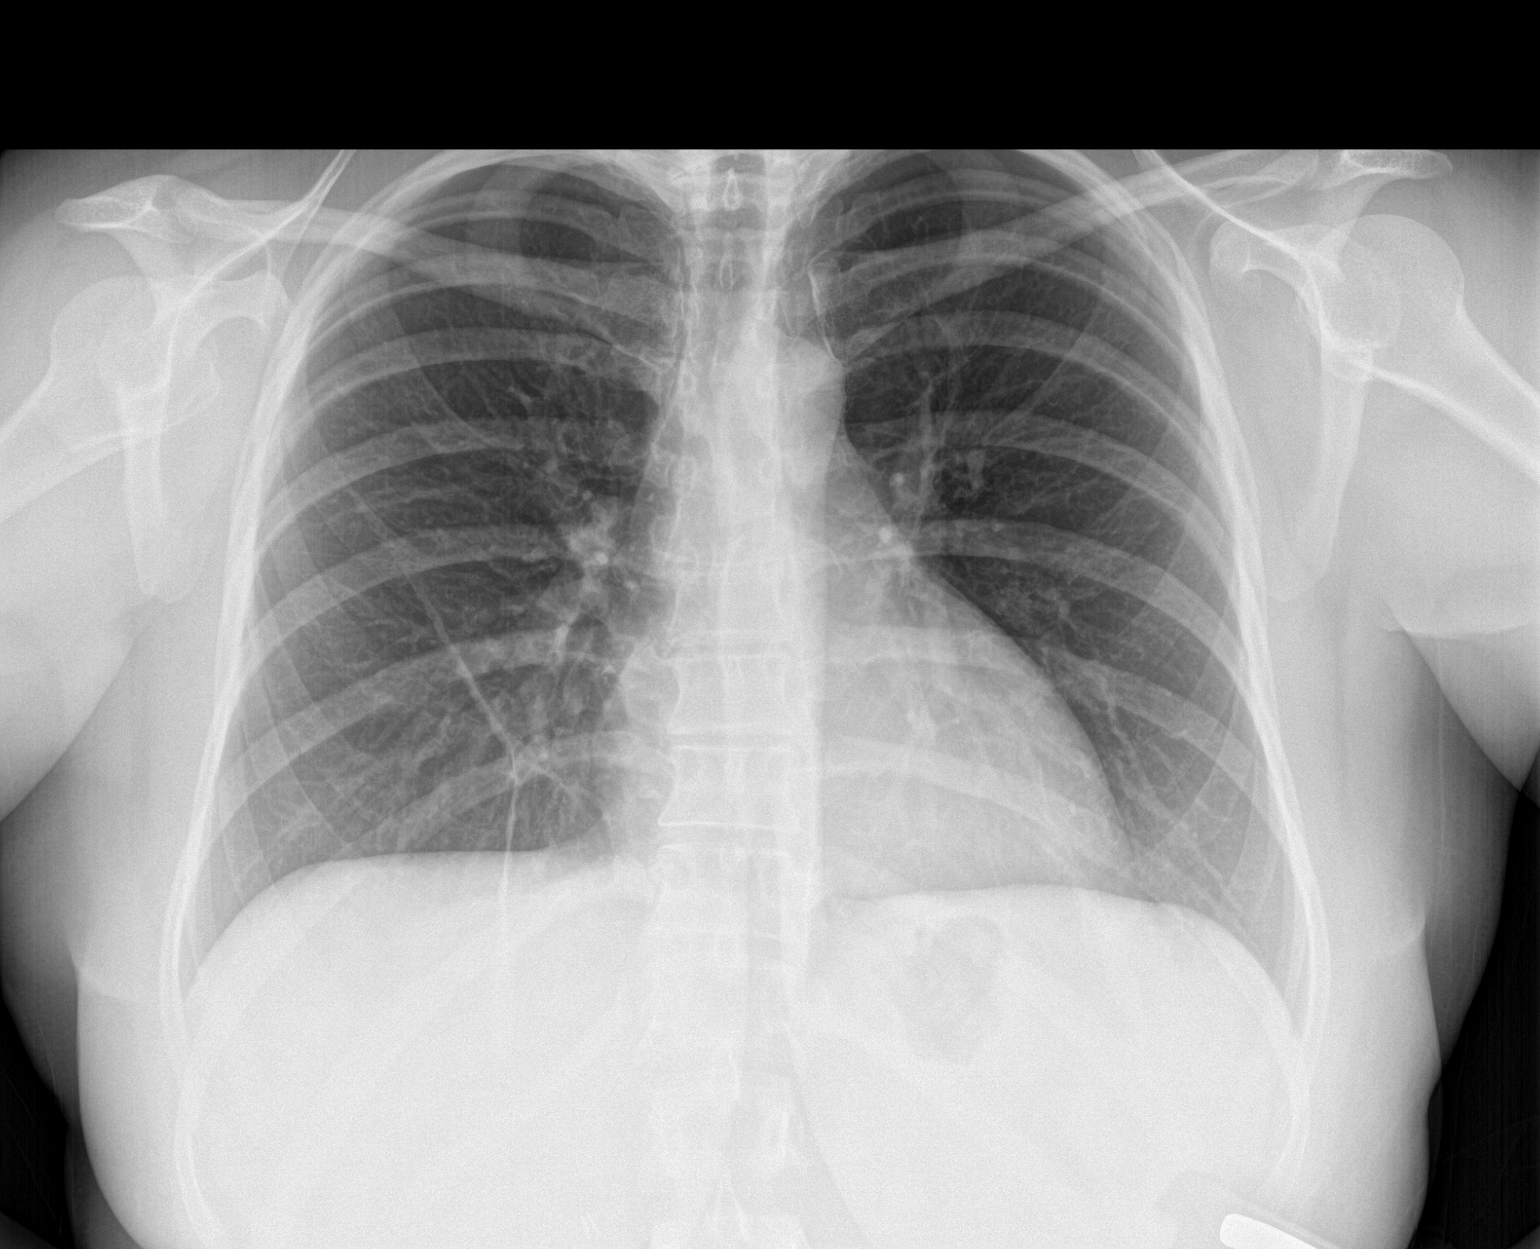

[chest lat]
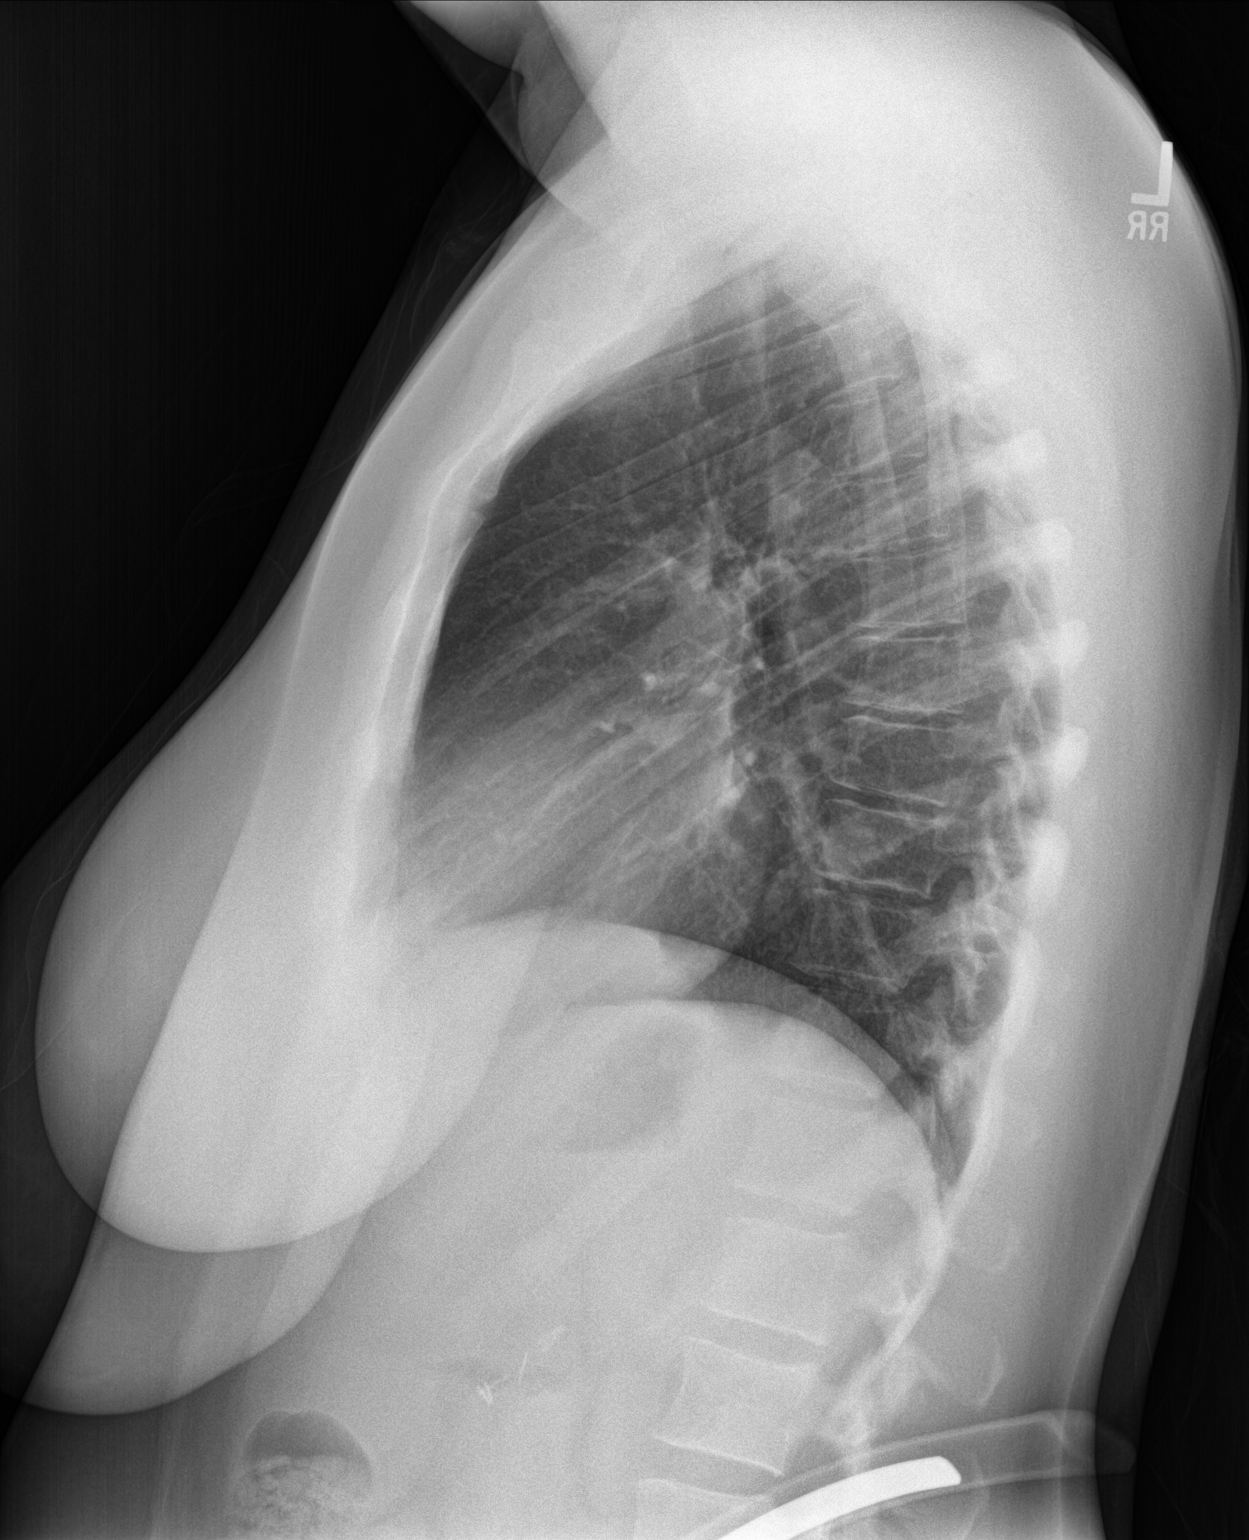

[2 of 2 positions shown; findings below may reference images not displayed]

FINDINGS: There is atelectatic change in the right lower lobe region. Lungs
elsewhere clear. Heart size and pulmonary vascularity are normal. No
adenopathy. No pneumothorax. No bone lesions.
IMPRESSION: Right lower lobe atelectasis.  No edema or consolidation.

## 2017-11-29 ENCOUNTER — Ambulatory Visit: Payer: Self-pay | Admitting: Physician Assistant

## 2018-01-31 ENCOUNTER — Encounter: Payer: Self-pay | Admitting: Cardiology

## 2018-03-26 IMAGING — DX DG CHEST 2V
2 series · 2 of 2 positions shown · non-contrast
Comparison: 05/19/2016

CLINICAL DATA: Chest pain and shortness of breath beginning
[REDACTED]. Worsening today. Left arm pain.

EXAM:
CHEST  2 VIEW

[w chest pa]
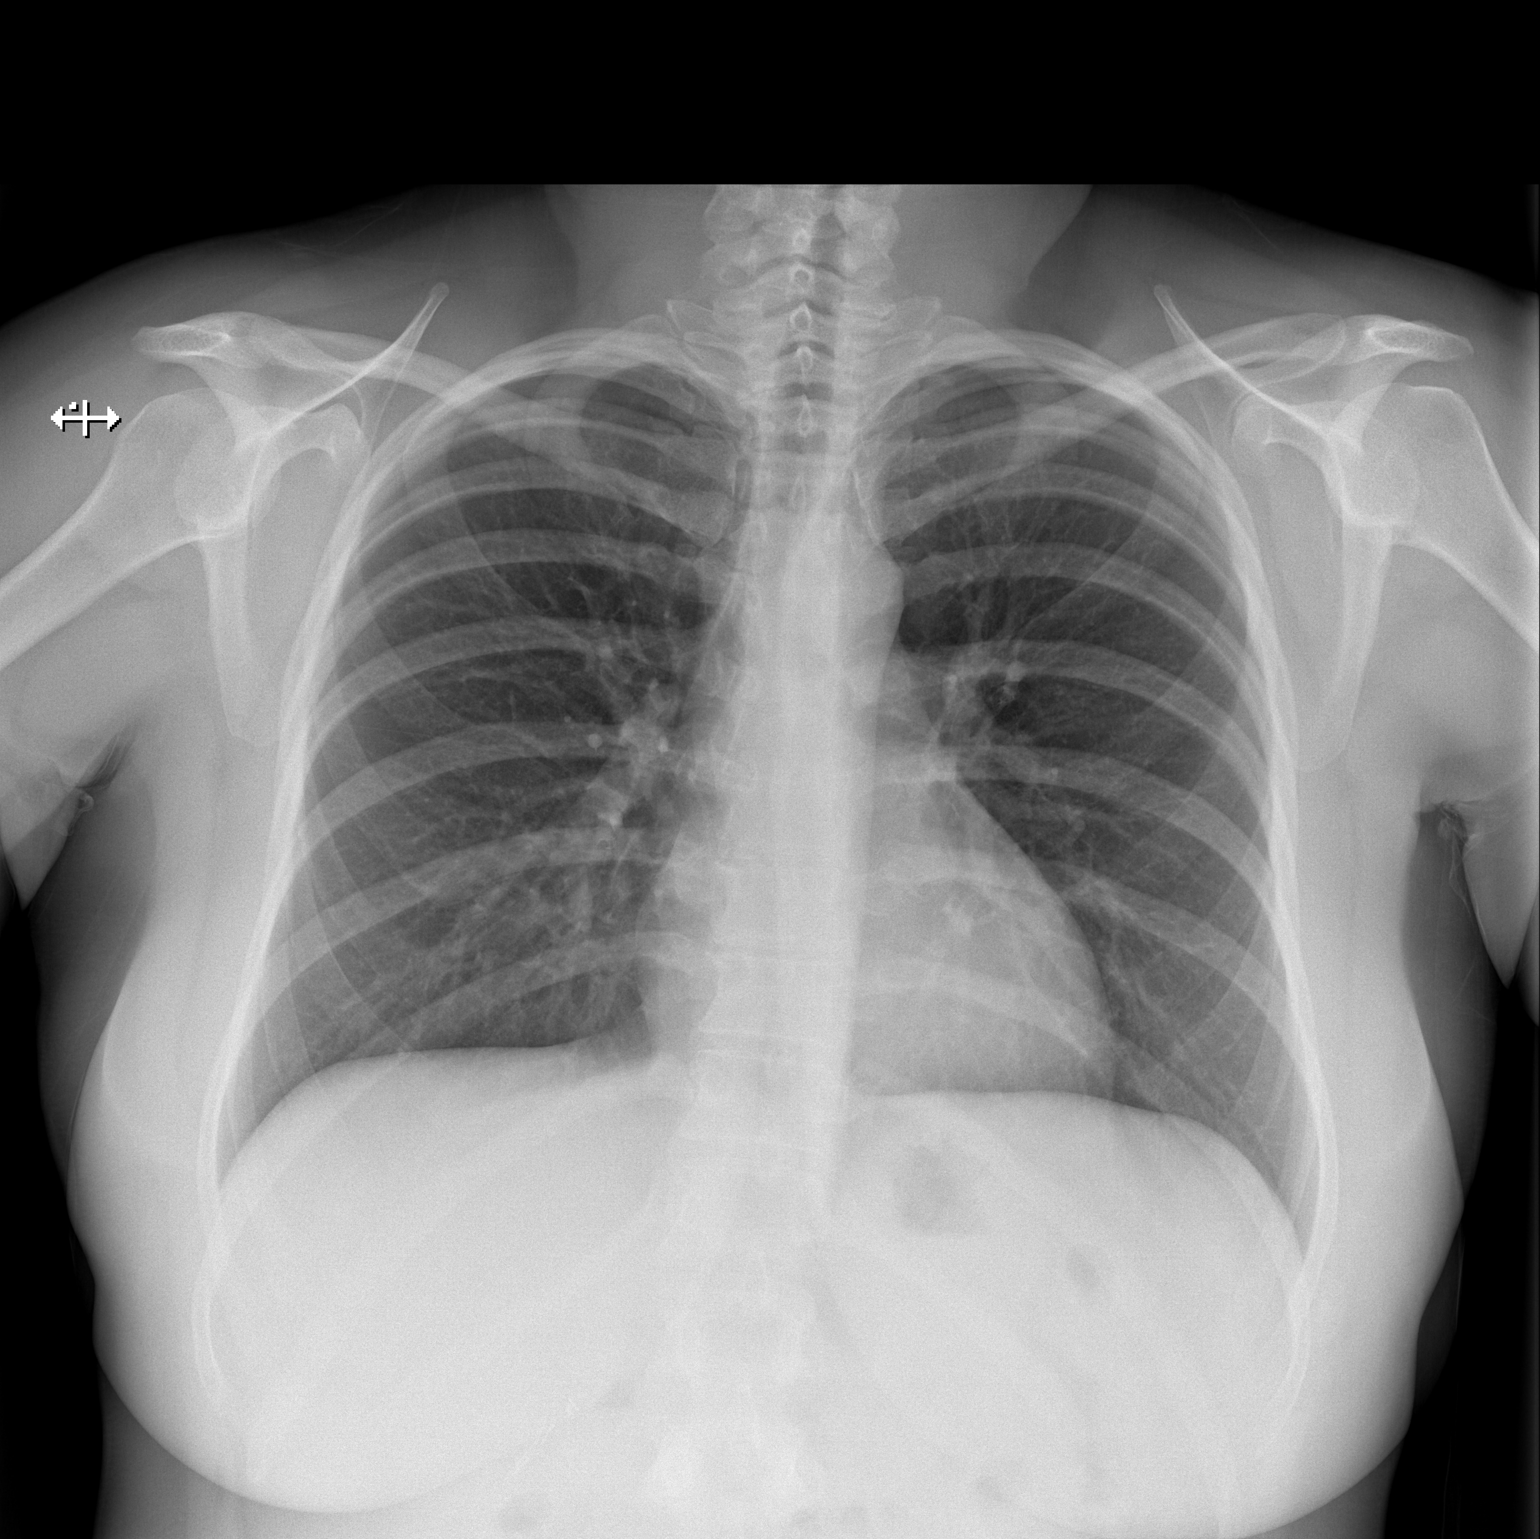

[w chest lat]
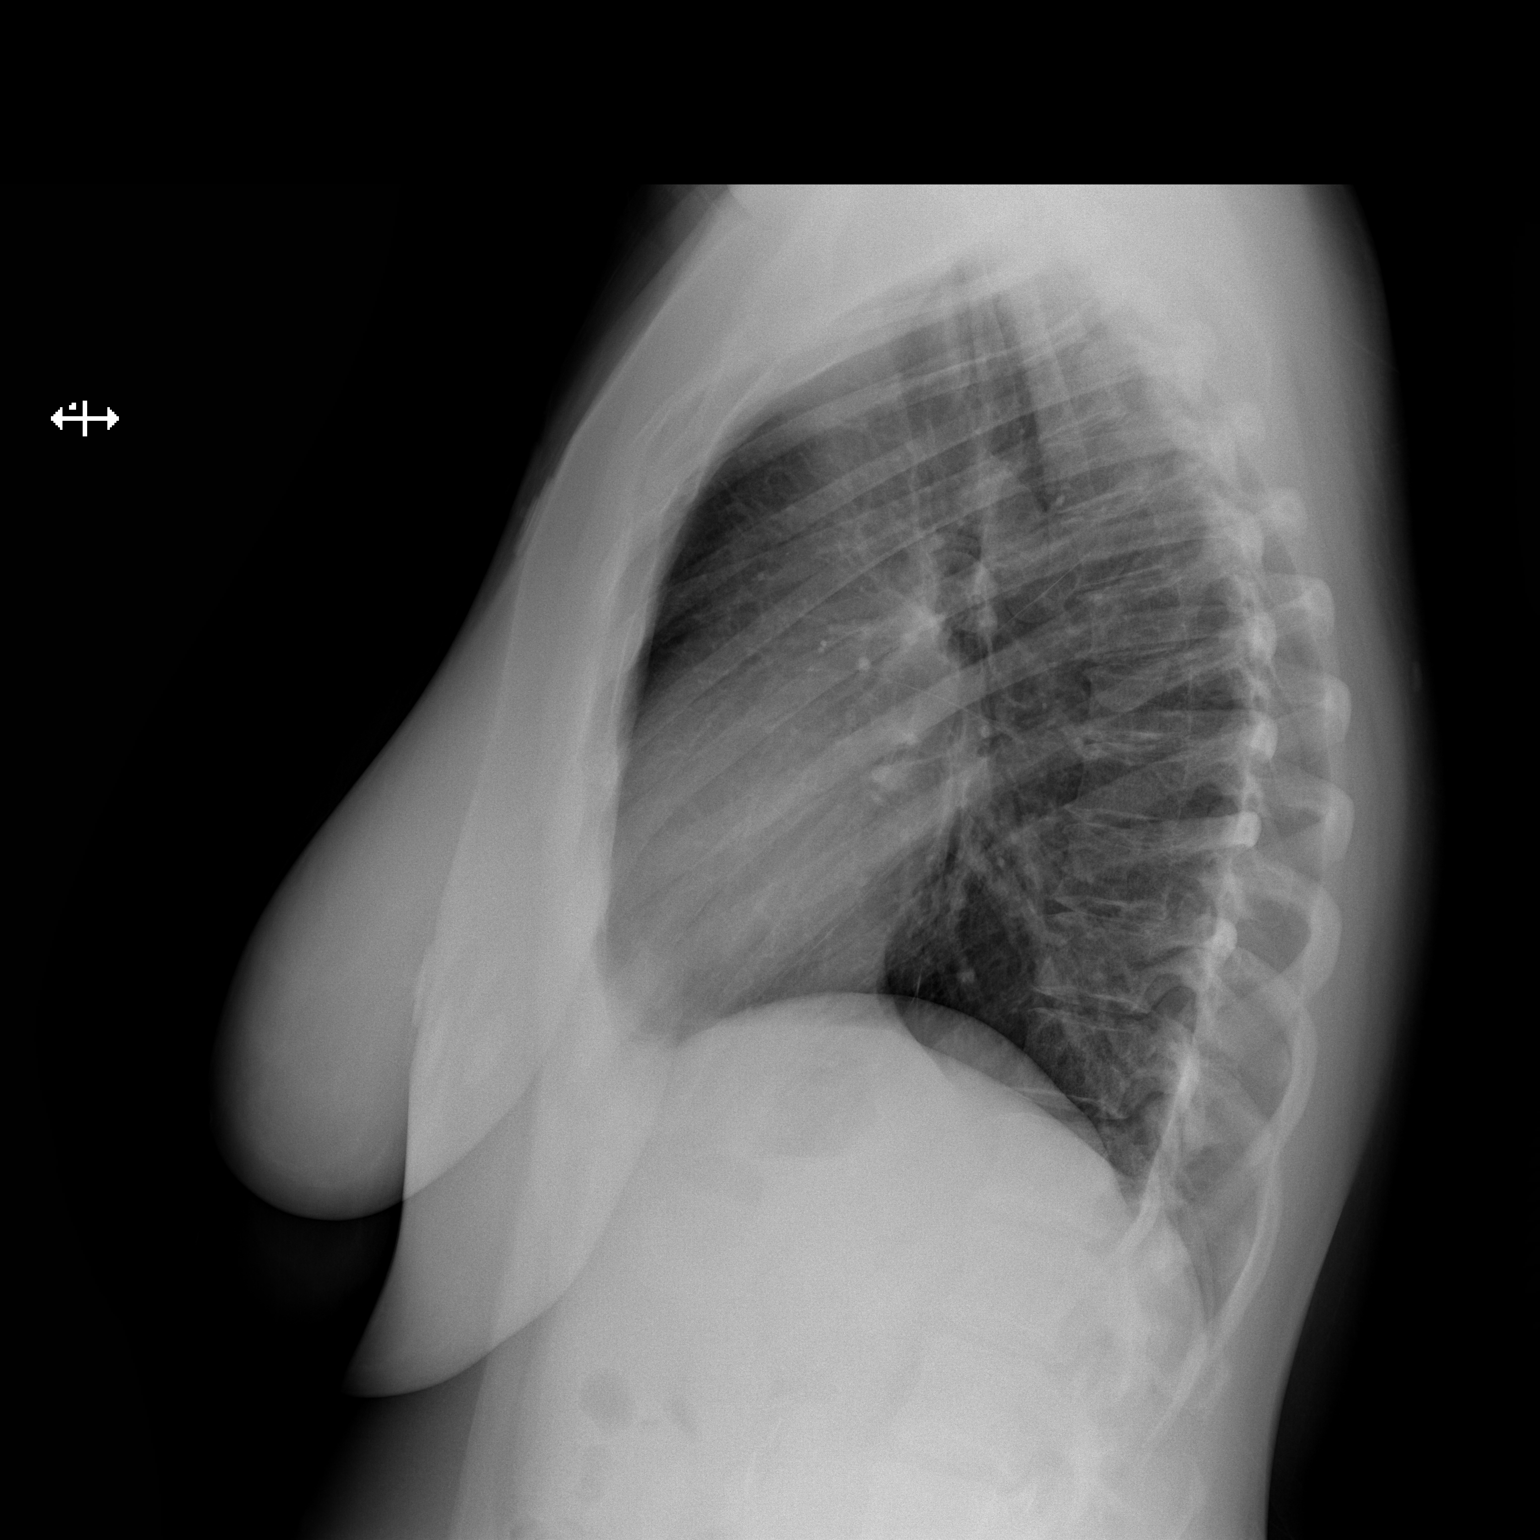

[2 of 2 positions shown; findings below may reference images not displayed]

FINDINGS: The heart size and mediastinal contours are within normal limits.
Both lungs are clear. Minimal thoracic spondylosis.
IMPRESSION: No active cardiopulmonary disease.

## 2018-05-14 ENCOUNTER — Emergency Department (HOSPITAL_BASED_OUTPATIENT_CLINIC_OR_DEPARTMENT_OTHER)
Admission: EM | Admit: 2018-05-14 | Discharge: 2018-05-14 | Disposition: A | Discharge status: Home / Self Care | Payer: Medicaid Other | Attending: Emergency Medicine | Admitting: Emergency Medicine

## 2018-05-14 ENCOUNTER — Encounter (HOSPITAL_BASED_OUTPATIENT_CLINIC_OR_DEPARTMENT_OTHER): Payer: Self-pay | Admitting: *Deleted

## 2018-05-14 ENCOUNTER — Emergency Department (HOSPITAL_BASED_OUTPATIENT_CLINIC_OR_DEPARTMENT_OTHER): Payer: Medicaid Other

## 2018-05-14 ENCOUNTER — Other Ambulatory Visit: Payer: Self-pay

## 2018-05-14 DIAGNOSIS — Z79899 Other long term (current) drug therapy: Secondary | ICD-10-CM | POA: Insufficient documentation

## 2018-05-14 DIAGNOSIS — I10 Essential (primary) hypertension: Secondary | ICD-10-CM | POA: Diagnosis not present

## 2018-05-14 DIAGNOSIS — R1031 Right lower quadrant pain: Secondary | ICD-10-CM

## 2018-05-14 DIAGNOSIS — Z87891 Personal history of nicotine dependence: Secondary | ICD-10-CM | POA: Insufficient documentation

## 2018-05-14 LAB — COMPREHENSIVE METABOLIC PANEL
ALBUMIN: 4.1 g/dL (ref 3.5–5.0)
ALT: 53 U/L — AB (ref 0–44)
ANION GAP: 10 (ref 5–15)
AST: 32 U/L (ref 15–41)
Alkaline Phosphatase: 69 U/L (ref 38–126)
BUN: 10 mg/dL (ref 6–20)
CO2: 24 mmol/L (ref 22–32)
Calcium: 9.2 mg/dL (ref 8.9–10.3)
Chloride: 102 mmol/L (ref 98–111)
Creatinine, Ser: 0.67 mg/dL (ref 0.44–1.00)
GFR calc Af Amer: >60 mL/min (ref >60)
GFR calc non Af Amer: >60 mL/min (ref >60)
GLUCOSE: 96 mg/dL (ref 70–99)
Potassium: 3.5 mmol/L (ref 3.5–5.1)
SODIUM: 136 mmol/L (ref 135–145)
Total Bilirubin: 0.4 mg/dL (ref 0.3–1.2)
Total Protein: 7.3 g/dL (ref 6.5–8.1)

## 2018-05-14 LAB — URINALYSIS, ROUTINE W REFLEX MICROSCOPIC
Bilirubin Urine: NEGATIVE
GLUCOSE, UA: NEGATIVE mg/dL
Ketones, ur: NEGATIVE mg/dL
Leukocytes, UA: NEGATIVE
Nitrite: NEGATIVE
PROTEIN: NEGATIVE mg/dL
SPECIFIC GRAVITY, URINE: 1.01 (ref 1.005–1.030)
pH: 6 (ref 5.0–8.0)

## 2018-05-14 LAB — CBC
HCT: 41.5 % (ref 36.0–46.0)
Hemoglobin: 13.5 g/dL (ref 12.0–15.0)
MCH: 29.4 pg (ref 26.0–34.0)
MCHC: 32.5 g/dL (ref 30.0–36.0)
MCV: 90.4 fL (ref 80.0–100.0)
PLATELETS: 296 10*3/uL (ref 150–400)
RBC: 4.59 MIL/uL (ref 3.87–5.11)
RDW: 12 % (ref 11.5–15.5)
WBC: 7.8 10*3/uL (ref 4.0–10.5)
nRBC: 0 % (ref 0.0–0.2)

## 2018-05-14 LAB — PREGNANCY, URINE: Preg Test, Ur: NEGATIVE

## 2018-05-14 LAB — LIPASE, BLOOD: Lipase: 37 U/L (ref 11–51)

## 2018-05-14 LAB — WET PREP, GENITAL
CLUE CELLS WET PREP: NONE SEEN
Sperm: NONE SEEN
TRICH WET PREP: NONE SEEN
Yeast Wet Prep HPF POC: NONE SEEN

## 2018-05-14 LAB — URINALYSIS, MICROSCOPIC (REFLEX)

## 2018-05-14 MED ORDER — IOPAMIDOL (ISOVUE-300) INJECTION 61%
100.0000 mL | Freq: Once | INTRAVENOUS | Status: AC | PRN
  Administered 2018-05-14: 100 mL via INTRAVENOUS

## 2018-05-14 NOTE — ED Notes (Signed)
Pt given peanut butter crackers and water. 

## 2018-05-14 NOTE — ED Notes (Signed)
ED Provider at bedside. 

## 2018-05-14 NOTE — ED Triage Notes (Signed)
Abdominal pain since last night. Pain is right lower quad pain.

## 2018-05-14 NOTE — Discharge Instructions (Signed)
If your pain is still they are in 1 to 2 days then you need a repeat CBC.  If you develop fevers, nausea vomiting diarrhea or any worsening symptoms please go to [facility] or [facility] for repeat evaluation.

## 2018-05-14 NOTE — ED Notes (Signed)
Patient transported to CT 

## 2018-05-14 NOTE — ED Provider Notes (Signed)
[facility] EMERGENCY DEPARTMENT Provider Note   CSN: [id number] Arrival date & time: 05/14/18  1631     History   Chief Complaint Chief Complaint  Patient presents with  . Abdominal Pain    HPI Christina Fox is a 37 y.o. female with past medical history of GERD, kidney stones, hypertension, who presents today for evaluation of right lower quadrant abdominal pain.  She reports that last night/early this morning she started having pain in her right lower quadrant.  It has not radiated or moved.  She has had her gallbladder out however has never had any other abdominal surgeries.  She still has her appendix.  She denies nausea vomiting or diarrhea.  No fevers at home.  She denies any recent trauma.    She denies any urinary symptoms.  No vaginal discharge or menstrual problems.  She says that she has not been sexually active in over a year, and has had her yearly GYN exam since last time she was sexually active.  HPI  Past Medical History:  Diagnosis Date  . Abdominal pain    "right lower quadrant pain and right lower back pain  . Anxiety   . Deviated septum 09/2011  . GERD (gastroesophageal reflux disease)   . Headache(784.0)    sinus  . History of echocardiogram    Echo 11/17: EF 55-60, no RWMA, normal diastolic function  . Hypertension    no meds  . Kidney stones    no current problem  . Migraine   . Nasal turbinate hypertrophy 09/2011   bilat.  . Pelvic inflammatory disease   . Urticaria     Patient Active Problem List   Diagnosis Date Noted  . Gallstone 01/07/2016  . History of colonic polyps 11/27/2015  . Right lower quadrant abdominal pain 11/27/2015  . HTN (hypertension), benign 11/24/2015  . Abdominal pain 11/16/2015  . Headache 10/13/2015  . 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 Anxiety disorder 11/03/2014    Past Surgical History:  Procedure Laterality Date  . CHOLECYSTECTOMY N/A 01/15/2016   Procedure: LAPAROSCOPIC CHOLECYSTECTOMY WITH  INTRAOPERATIVE CHOLANGIOGRAM;  Surgeon: Christene Lye, MD;  Location: [facility]S;  Service: General;  Laterality: N/A;  . COLONOSCOPY  2007   [facility]: pedunculated 3mm polyp in distal sigmoid, sessile 52mm polyp at splenic flexure, larger polyp tubulovillous adenoma and smaller polyp tubular adenoma   . COLONOSCOPY  09-09-15  . COLONOSCOPY WITH PROPOFOL N/A 03/01/2016   Procedure: COLONOSCOPY WITH PROPOFOL;  Surgeon: Garlan Fair, MD;  Location: [facility];  Service: Endoscopy;  Laterality: N/A;  . NASAL SEPTOPLASTY W/ TURBINOPLASTY  10/04/2011   Procedure: NASAL SEPTOPLASTY WITH TURBINATE REDUCTION;  Surgeon: Ascencion Dike, MD;  Location: [facility];  Service: ENT;  Laterality: Bilateral;  . WISDOM TOOTH EXTRACTION  2009     OB History    Gravida  4   Para  4   Term  4   Preterm      AB      Living  4     SAB      TAB      Ectopic      Multiple      Live Births  4        Obstetric Comments  1st Menstrual Cycle:  10  1st Pregnancy:  16          Home Medications    Prior to Admission medications   Medication Sig Start Date End Date Taking?  Authorizing Provider  FLUoxetine HCl (PROZAC PO) Take by mouth.   Yes [provider]  loratadine (CLARITIN) 10 MG tablet Take 10 mg by mouth daily.   Yes [provider]  montelukast (SINGULAIR) 10 MG tablet Take 10 mg by mouth at bedtime.   Yes [provider]  Olopatadine HCl (PATANASE) 0.6 % SOLN Place 2 drops (2 puffs total) into both nostrils 2 (two) times daily. 11/03/16  Yes Padgett, Rae Halsted, MD  omeprazole (PRILOSEC) 40 MG capsule Take 40 mg by mouth daily.   Yes [provider]  Prenat-Fe Poly-Methfol-FA-DHA (VITAFOL ULTRA) 29-0.6-0.4-200 MG CAPS Take 1 capsule by mouth daily before breakfast. 10/05/17  Yes Shelly Bombard, MD  acetaminophen (TYLENOL) 500 MG tablet Take 1 tablet (500 mg total) by mouth every 6 (six) hours as needed. Patient taking  differently: Take 1,000 mg by mouth every 8 (eight) hours as needed.  08/31/16   Waynetta Pean, PA-C  ascorbic acid (VITAMIN C) 500 MG tablet Take 500 mg by mouth daily.    [provider]  azithromycin (ZITHROMAX) 250 MG tablet Take 1 tablet (250 mg total) by mouth daily. Take 1 tablet daily for 6 days 06/06/17   Jacqlyn Larsen, PA-C  cholecalciferol (VITAMIN D) 1000 units tablet Take 1,000 Units by mouth daily.    [provider]  clonazePAM (KLONOPIN) 0.5 MG tablet Take 0.25 mg by mouth daily.     [provider]  fluconazole (DIFLUCAN) 150 MG tablet TAKE 1 TABLET BY MOUTH AS A ONE TIME DOSE 10/05/17   Shelly Bombard, MD  fluticasone [facility]) 50 MCG/ACT nasal spray Place 2 sprays into both nostrils daily as needed for allergies or rhinitis.    [provider]  ibuprofen (ADVIL,MOTRIN) 800 MG tablet TAKE ONE TABLET BY MOUTH EVERY 8 HOURS 02/22/17   Shelly Bombard, MD  Levonorgest-Eth Estrad-Fe Bisg (BALCOLTRA) 0.1-20 MG-MCG(21) TABS Take 1 tablet by mouth daily. 10/03/17   Shelly Bombard, MD  Multiple Vitamin (MULTIVITAMIN) tablet Take 1 tablet by mouth daily.    [provider]  Multiple Vitamins-Minerals (MULTIVITAMIN PO) Take 1 tablet by mouth every morning.    [provider]  ondansetron (ZOFRAN ODT) 4 MG disintegrating tablet 4mg  ODT q4 hours prn nausea/vomit 06/06/17   Jacqlyn Larsen, PA-C  potassium chloride SA (K-DUR,KLOR-CON) 20 MEQ tablet Take by mouth. 10/31/16   [provider]  SUPER B COMPLEX/C PO Take by mouth.    [provider]  vitamin B-12 (CYANOCOBALAMIN) 1000 MCG tablet Take 1,000 mcg by mouth daily.    [provider]  escitalopram (LEXAPRO) 10 MG tablet Take 10 mg by mouth daily.    09/02/11  [provider]    Family History Family History  Problem Relation Age of Onset  . Kidney cancer Mother   . Hypertension Mother   . Migraines Mother   . Multiple sclerosis Mother   .  Fibromyalgia Mother   . Liver disease Mother        NASH   . Hypertension Father   . Liver disease Father        liver transplant; had fatty liver disease  . Spina bifida Brother   . Migraines Maternal Grandmother   . Hypertension Other   . Colon cancer Paternal Grandfather   . Allergic rhinitis Daughter   . Asthma Daughter   . Allergic rhinitis Son   . Asthma Son   . Angioedema Neg Hx   . Eczema  Neg Hx   . Immunodeficiency Neg Hx   . Urticaria Neg Hx     Social History Social History   Tobacco Use  . Smoking status: Former Smoker    Packs/day: 0.25    Years: 10.00    Pack years: 2.50    Last attempt to quit: 08/23/2008    Years since quitting: 9.7  . Smokeless tobacco: Never Used  Substance Use Topics  . Alcohol use: Yes    Alcohol/week: 0.0 standard drinks    Comment: ocassionally  . Drug use: No     Allergies   Clarithromycin; Doxycycline; Bentyl [dicyclomine hcl]; Haldol [haloperidol]; Meclizine; Morphine and related; Toradol [ketorolac tromethamine]; Macrobid [nitrofurantoin monohyd macro]; and Sulfa antibiotics   Review of Systems Review of Systems  Constitutional: Negative for chills and fever.  Respiratory: Negative for chest tightness and shortness of breath.   Cardiovascular: Negative for chest pain.  Gastrointestinal: Positive for abdominal pain. Negative for diarrhea, nausea and vomiting.  Genitourinary: Negative for dysuria, flank pain, frequency, pelvic pain, vaginal bleeding, vaginal discharge and vaginal pain.  Musculoskeletal: Negative for back pain and neck pain.  All other systems reviewed and are negative.    Physical Exam Updated Vital Signs BP (!) 157/98   Pulse 78   Temp 97.9 F (36.6 C) (Oral)   Resp 16   Ht 5\' 2"  (1.575 m)   Wt 89.4 kg   LMP 04/30/2018   SpO2 98%   BMI 36.03 kg/m   Physical Exam  Constitutional: She appears well-developed and well-nourished.  Non-toxic appearance. No distress.  HENT:  Head: Normocephalic  and atraumatic.  Mouth/Throat: Oropharynx is clear and moist.  Eyes: Conjunctivae are normal.  Neck: Neck supple.  Cardiovascular: Normal rate and regular rhythm.  No murmur heard. Pulmonary/Chest: Effort normal and breath sounds normal. No respiratory distress.  Abdominal: Soft. Normal appearance and bowel sounds are normal. There is tenderness in the right lower quadrant. There is rebound and tenderness at McBurney's point. There is no rigidity and no guarding.  Genitourinary: Vagina normal. Uterus is not tender. Cervix exhibits no motion tenderness, no discharge and no friability. Right adnexum displays no mass, no tenderness and no fullness. Left adnexum displays no mass, no tenderness and no fullness. No erythema, tenderness or bleeding in the vagina. No vaginal discharge found.  Genitourinary Comments: Exam performed with RN chaperone in room.  Musculoskeletal: She exhibits no edema.  Neurological: She is alert.  Skin: Skin is warm and dry.  Psychiatric: She has a normal mood and affect.  Nursing note and vitals reviewed.    ED Treatments / Results  Labs (all labs ordered are listed, but only abnormal results are displayed) Labs Reviewed  WET PREP, GENITAL - Abnormal; Notable for the following components:      Result Value   WBC, Wet Prep HPF POC MANY (*)    All other components within normal limits  COMPREHENSIVE METABOLIC PANEL - Abnormal; Notable for the following components:   ALT 53 (*)    All other components within normal limits  URINALYSIS, ROUTINE W REFLEX MICROSCOPIC - Abnormal; Notable for the following components:   Hgb urine dipstick TRACE (*)    All other components within normal limits  URINALYSIS, MICROSCOPIC (REFLEX) - Abnormal; Notable for the following components:   Bacteria, UA RARE (*)    All other components within normal limits  LIPASE, BLOOD  CBC  PREGNANCY, URINE  GC/CHLAMYDIA PROBE AMP ([place]) NOT AT [facility]    EKG None  Radiology Ct  Abdomen Pelvis W Contrast  Result Date: 05/14/2018 CLINICAL DATA:  RIGHT lower quadrant pain since last night. Assess for appendicitis. History of kidney stones, cholecystectomy, pelvic inflammatory disease. EXAM: CT ABDOMEN AND PELVIS WITH CONTRAST TECHNIQUE: Multidetector CT imaging of the abdomen and pelvis was performed using the standard protocol following bolus administration of intravenous contrast. CONTRAST:  18mL ISOVUE-300 IOPAMIDOL (ISOVUE-300) INJECTION 61% COMPARISON:  CT abdomen and pelvis October 15, 2017 FINDINGS: LOWER CHEST: Bibasilar atelectasis. Included heart size is normal. No pericardial effusion. HEPATOBILIARY: The liver is diffusely hypodense. Status post cholecystectomy. PANCREAS: Normal. SPLEEN: Normal. ADRENALS/URINARY TRACT: Kidneys are orthotopic, demonstrating symmetric enhancement. No nephrolithiasis, hydronephrosis or solid renal masses. The unopacified ureters are normal in course and caliber. Urinary bladder is partially distended and unremarkable. Normal adrenal glands. STOMACH/BOWEL: The stomach, small and large bowel are normal in course and caliber without inflammatory changes. A few scattered colonic diverticula present. Normal appendix. VASCULAR/LYMPHATIC: Aortoiliac vessels are normal in course and caliber. No lymphadenopathy by CT size criteria. REPRODUCTIVE: Punctate uterine versus endometrial calcification. OTHER: Trace free fluid in the pelvis, within physiologic range. Trace peripancreatic retroperitoneal free fluid. MUSCULOSKELETAL: Nonacute.  Moderate L4-5 disc osteophyte complex. IMPRESSION: 1. Trace retroperitoneal free fluid without FLAIR in tori changes though, early appendicitis or duodenitis possible. Normal appendix. 2. Hepatic steatosis.  Status post cholecystectomy. Electronically Signed   By: Elon Alas M.D.   On: 05/14/2018 20:58    Procedures Procedures (including critical care time)  Medications Ordered in ED Medications  iopamidol  (ISOVUE-300) 61 % injection 100 mL (100 mLs Intravenous Contrast Given 05/14/18 2021)     Initial Impression / Assessment and Plan / ED Course  I have reviewed the triage vital signs and the nursing notes.  Pertinent labs & imaging results that were available during my care of the patient were reviewed by me and considered in my medical decision making (see chart for details).  Clinical Course as of May 15 40  Mon May 14, 2018  2147 Spoke with dr. Lucia Gaskins from general surgery, he will look at CT scan and call me back.    [EH]  2158 Spoke with Dr. Mariea Clonts, Who says no evidence of appendicitis, especially given normal white count.  Recommended return precautions, if her pain persists in 1 to 2 days then recheck white count.   [EH]    Clinical Course User Index [EH] Lorin Glass, PA-C   Patient presents today for evaluation of abdominal pain since last night.  Pain is localized to the right lower quadrant.  She does have a remote history of kidney stones however says that this feels different.  Labs showed no significant hematologic abnormalities.  ALT is mildly elevated at 53, however this is normal for patient.  No significant electrolyte abnormalities.  Pregnancy test is negative, urine is not consistent with infection.  Wet prep has many white blood cells without clue cells, trichomoniasis, or other abnormalities.  GC test sent.  CT scan showed concern for retroperitoneal fluid which was read as possibly early appendicitis.  I spoke with Dr. Mariea Clonts on-call for general surgery at [facility] who says that there is no evidence of appendicitis.  Patient is instructed that if she continues to have pain in the next 2 days she needs repeat evaluation with repeat CBC to check for leukocytosis.  She is given return precautions, specifically including fever, nausea vomiting diarrhea, worsening pain, or any concerns.  Given lack of CMT or adnexal fullness/TTP not consistent  with PID, ovarian  torsion, or TOA.    Return precautions were discussed with patient who states their understanding.  At the time of discharge patient denied any unaddressed complaints or concerns.  Patient is agreeable for discharge home.   Final Clinical Impressions(s) / ED Diagnoses   Final diagnoses:  RLQ abdominal pain    ED Discharge Orders    None       Lorin Glass, PA-C 05/15/18 0041    Lennice Sites, DO 05/15/18 0127

## 2018-05-15 LAB — GC/CHLAMYDIA PROBE AMP (~~LOC~~) NOT AT ARMC
Chlamydia: NEGATIVE
NEISSERIA GONORRHEA: NEGATIVE

## 2018-09-20 ENCOUNTER — Other Ambulatory Visit: Payer: Self-pay | Admitting: Obstetrics

## 2018-09-20 DIAGNOSIS — B3731 Acute candidiasis of vulva and vagina: Secondary | ICD-10-CM

## 2018-09-20 DIAGNOSIS — B373 Candidiasis of vulva and vagina: Secondary | ICD-10-CM

## 2018-09-24 ENCOUNTER — Other Ambulatory Visit: Payer: Self-pay | Admitting: Obstetrics

## 2018-09-24 DIAGNOSIS — B373 Candidiasis of vulva and vagina: Secondary | ICD-10-CM

## 2018-09-24 DIAGNOSIS — B3731 Acute candidiasis of vulva and vagina: Secondary | ICD-10-CM

## 2018-09-25 ENCOUNTER — Other Ambulatory Visit: Payer: Self-pay

## 2018-09-25 DIAGNOSIS — B3731 Acute candidiasis of vulva and vagina: Secondary | ICD-10-CM

## 2018-09-25 DIAGNOSIS — B373 Candidiasis of vulva and vagina: Secondary | ICD-10-CM

## 2018-09-25 MED ORDER — FLUCONAZOLE 150 MG PO TABS: ORAL_TABLET | ORAL | 0 refills | Status: DC

## 2018-09-25 NOTE — Progress Notes (Signed)
Patient called with yeast infection symptoms, white discharge and itching/irritation. Pt states she finished taking an antibiotic a couple days ago. Diflucan 1 dose ordered to patients pharmacy, pt verbalized understanding.

## 2018-09-26 ENCOUNTER — Other Ambulatory Visit: Payer: Self-pay

## 2018-09-26 DIAGNOSIS — N898 Other specified noninflammatory disorders of vagina: Secondary | ICD-10-CM

## 2018-09-26 MED ORDER — METRONIDAZOLE 500 MG PO TABS: 500.0000 mg | ORAL_TABLET | Freq: Two times a day (BID) | ORAL | 0 refills | Status: DC

## 2018-09-26 MED ORDER — FLUCONAZOLE 150 MG PO TABS: 150.0000 mg | ORAL_TABLET | Freq: Once | ORAL | 1 refills | Status: AC

## 2018-09-26 NOTE — Progress Notes (Signed)
Pt called stating that her symptoms today have changed to a clear vaginal discharge with an odor, sent rx flagyl to patients pharmacy per protocol for bacterial vaginosis. Pt verbalized understanding.

## 2018-11-01 ENCOUNTER — Other Ambulatory Visit: Payer: Self-pay | Admitting: *Deleted

## 2018-11-01 DIAGNOSIS — E569 Vitamin deficiency, unspecified: Secondary | ICD-10-CM

## 2018-11-01 MED ORDER — VITAFOL ULTRA 29-0.6-0.4-200 MG PO CAPS: 1.0000 | ORAL_CAPSULE | Freq: Every day | ORAL | 11 refills | Status: DC

## 2018-11-01 NOTE — Progress Notes (Signed)
Pt called to office for PNV Rx. Refill was sent today.

## 2019-01-05 ENCOUNTER — Emergency Department (HOSPITAL_BASED_OUTPATIENT_CLINIC_OR_DEPARTMENT_OTHER)
Admission: EM | Admit: 2019-01-05 | Discharge: 2019-01-06 | Disposition: A | Discharge status: Home / Self Care | Payer: Medicaid Other | Attending: Emergency Medicine | Admitting: Emergency Medicine

## 2019-01-05 ENCOUNTER — Inpatient Hospital Stay (EMERGENCY_DEPARTMENT_HOSPITAL)
Admission: AD | Admit: 2019-01-05 | Discharge: 2019-01-05 | Disposition: A | Discharge status: Home / Self Care | Payer: Medicaid Other | Source: Home / Self Care | Attending: Obstetrics and Gynecology | Admitting: Obstetrics and Gynecology

## 2019-01-05 ENCOUNTER — Other Ambulatory Visit: Payer: Self-pay

## 2019-01-05 ENCOUNTER — Encounter (HOSPITAL_BASED_OUTPATIENT_CLINIC_OR_DEPARTMENT_OTHER): Payer: Self-pay | Admitting: Emergency Medicine

## 2019-01-05 DIAGNOSIS — N912 Amenorrhea, unspecified: Secondary | ICD-10-CM

## 2019-01-05 DIAGNOSIS — Z87891 Personal history of nicotine dependence: Secondary | ICD-10-CM | POA: Insufficient documentation

## 2019-01-05 DIAGNOSIS — B9689 Other specified bacterial agents as the cause of diseases classified elsewhere: Secondary | ICD-10-CM

## 2019-01-05 DIAGNOSIS — N760 Acute vaginitis: Secondary | ICD-10-CM | POA: Diagnosis not present

## 2019-01-05 DIAGNOSIS — I10 Essential (primary) hypertension: Secondary | ICD-10-CM | POA: Insufficient documentation

## 2019-01-05 DIAGNOSIS — R1030 Lower abdominal pain, unspecified: Secondary | ICD-10-CM | POA: Diagnosis present

## 2019-01-05 DIAGNOSIS — Z79899 Other long term (current) drug therapy: Secondary | ICD-10-CM | POA: Diagnosis not present

## 2019-01-05 LAB — URINALYSIS, ROUTINE W REFLEX MICROSCOPIC
Bilirubin Urine: NEGATIVE
Glucose, UA: NEGATIVE mg/dL
Ketones, ur: NEGATIVE mg/dL
Leukocytes,Ua: NEGATIVE
Nitrite: NEGATIVE
Protein, ur: NEGATIVE mg/dL
Specific Gravity, Urine: 1.025 (ref 1.005–1.030)
pH: 6 (ref 5.0–8.0)

## 2019-01-05 LAB — COMPREHENSIVE METABOLIC PANEL
ALT: 53 U/L — ABNORMAL HIGH (ref 0–44)
AST: 28 U/L (ref 15–41)
Albumin: 4 g/dL (ref 3.5–5.0)
Alkaline Phosphatase: 81 U/L (ref 38–126)
Anion gap: 9 (ref 5–15)
BUN: 9 mg/dL (ref 6–20)
CO2: 26 mmol/L (ref 22–32)
Calcium: 8.8 mg/dL — ABNORMAL LOW (ref 8.9–10.3)
Chloride: 102 mmol/L (ref 98–111)
Creatinine, Ser: 0.77 mg/dL (ref 0.44–1.00)
GFR calc Af Amer: >60 mL/min (ref >60)
GFR calc non Af Amer: >60 mL/min (ref >60)
Glucose, Bld: 92 mg/dL (ref 70–99)
Potassium: 3.3 mmol/L — ABNORMAL LOW (ref 3.5–5.1)
Sodium: 137 mmol/L (ref 135–145)
Total Bilirubin: 0.4 mg/dL (ref 0.3–1.2)
Total Protein: 7.4 g/dL (ref 6.5–8.1)

## 2019-01-05 LAB — URINALYSIS, MICROSCOPIC (REFLEX)

## 2019-01-05 LAB — CBC WITH DIFFERENTIAL/PLATELET
Abs Immature Granulocytes: 0.04 10*3/uL (ref 0.00–0.07)
Basophils Absolute: 0.1 10*3/uL (ref 0.0–0.1)
Basophils Relative: 1 %
Eosinophils Absolute: 0.4 10*3/uL (ref 0.0–0.5)
Eosinophils Relative: 4 %
HCT: 40 % (ref 36.0–46.0)
Hemoglobin: 12.8 g/dL (ref 12.0–15.0)
Immature Granulocytes: 0 %
Lymphocytes Relative: 20 %
Lymphs Abs: 1.9 10*3/uL (ref 0.7–4.0)
MCH: 29.2 pg (ref 26.0–34.0)
MCHC: 32 g/dL (ref 30.0–36.0)
MCV: 91.1 fL (ref 80.0–100.0)
Monocytes Absolute: 1.1 10*3/uL — ABNORMAL HIGH (ref 0.1–1.0)
Monocytes Relative: 11 %
Neutro Abs: 6 10*3/uL (ref 1.7–7.7)
Neutrophils Relative %: 64 %
Platelets: 292 10*3/uL (ref 150–400)
RBC: 4.39 MIL/uL (ref 3.87–5.11)
RDW: 12.8 % (ref 11.5–15.5)
WBC: 9.5 10*3/uL (ref 4.0–10.5)
nRBC: 0 % (ref 0.0–0.2)

## 2019-01-05 LAB — LIPASE, BLOOD: Lipase: 39 U/L (ref 11–51)

## 2019-01-05 LAB — POCT PREGNANCY, URINE: Preg Test, Ur: NEGATIVE

## 2019-01-05 LAB — WET PREP, GENITAL
Sperm: NONE SEEN
Trich, Wet Prep: NONE SEEN
Yeast Wet Prep HPF POC: NONE SEEN

## 2019-01-05 NOTE — MAU Note (Signed)
Feeling pelvic pressure.  Started having pink spotting the day before yesterday.  No abdominal pain.  LMP 12/07/2018.

## 2019-01-05 NOTE — ED Triage Notes (Addendum)
Reports lower abdominal pain for the last two days then noticed spotting today.  Went to [facility] but was told she had to have a positive pregnancy test to be treated there.  Urine preg was negative there.  Denies any pain or frequency with urination just states it feels weird.

## 2019-01-05 NOTE — MAU Provider Note (Signed)
Ms. Christina Fox is a 38 y.o. [phone] who present to [facility]U today for pregnancy confirmation. She denies abdominal pain or vaginal bleeding. She states that she had a negative pregnancy test at home, but wasn't sure if it was accurate. She has an appt with an OB on 01/08/2019  BP (!) 127/93   Pulse (!) 102   Temp 98.2 F (36.8 C)   Resp 19   Wt 98.1 kg   BMI 39.58 kg/m  CONSTITUTIONAL: Well-developed, well-nourished female in no acute distress.  CARDIOVASCULAR: Normal heart rate noted RESPIRATORY: Effort and breath sounds normal GASTROINTESTINAL:Soft, no distention noted.  No tenderness, rebound or guarding.  SKIN: Skin is warm and dry. No rash noted. Not diaphoretic. No erythema. No pallor. PSYCHIATRIC: Normal mood and affect. Normal behavior. Normal judgment and thought content.  MDM Medical screening exam complete Patient does not endorse any symptoms concerning for ectopic pregnancy or pregnancy related complication today.   A:  Amenorrhea  P: Discharge home Patient advised that she can present as a walk-in to [facility] for a pregnancy test M-Th between 8am-4pm or Friday between 8am -11am Reasons to return to MAU reviewed  Patient may return to MAU as needed or if her condition were to change or worsen  Marcille Buffy DNP, CNM  01/05/19  10:37 PM

## 2019-01-05 NOTE — ED Provider Notes (Signed)
[facility] EMERGENCY DEPARTMENT Provider Note  CSN: [id number] Arrival date & time: 01/05/19 2255  Chief Complaint(s) Abdominal Pain  HPI Christina Fox is a 38 y.o. female    Abdominal Pain Pain location:  Suprapubic Pain quality: pressure   Pain radiates to:  Does not radiate Pain severity:  Mild Onset quality:  Gradual Duration:  4 hours Timing:  Constant Progression:  Waxing and waning Chronicity:  Recurrent Relieved by:  Nothing Worsened by:  Nothing Associated symptoms: vaginal bleeding (spotting 2 days ago when period was suppose to start, then stopped.)   Associated symptoms: no chills, no constipation, no cough, no diarrhea, no dysuria, no fatigue, no fever, no hematemesis, no hematochezia, no hematuria, no melena, no nausea, no vaginal discharge and no vomiting     Past Medical History Past Medical History:  Diagnosis Date  . Abdominal pain    "right lower quadrant pain and right lower back pain  . Anxiety   . Deviated septum 09/2011  . GERD (gastroesophageal reflux disease)   . Headache(784.0)    sinus  . History of echocardiogram    Echo 11/17: EF 55-60, no RWMA, normal diastolic function  . Hypertension    no meds  . Kidney stones    no current problem  . Migraine   . Nasal turbinate hypertrophy 09/2011   bilat.  . Pelvic inflammatory disease   . Urticaria    Patient Active Problem List   Diagnosis Date Noted  . Gallstone 01/07/2016  . History of colonic polyps 11/27/2015  . Right lower quadrant abdominal pain 11/27/2015  . HTN (hypertension), benign 11/24/2015  . Abdominal pain 11/16/2015  . Headache 10/13/2015  . 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 Anxiety disorder 11/03/2014   Home Medication(s) Prior to Admission medications   Medication Sig Start Date End Date Taking? Authorizing Provider  acetaminophen (TYLENOL) 500 MG tablet Take 1 tablet (500 mg total) by mouth every 6 (six) hours as needed. Patient taking differently: Take 1,000  mg by mouth every 8 (eight) hours as needed.  08/31/16   Waynetta Pean, PA-C  ascorbic acid (VITAMIN C) 500 MG tablet Take 500 mg by mouth daily.    [provider]  azithromycin (ZITHROMAX) 250 MG tablet Take 1 tablet (250 mg total) by mouth daily. Take 1 tablet daily for 6 days 06/06/17   Jacqlyn Larsen, PA-C  cholecalciferol (VITAMIN D) 1000 units tablet Take 1,000 Units by mouth daily.    [provider]  clonazePAM (KLONOPIN) 0.5 MG tablet Take 0.25 mg by mouth daily.     [provider]  fluconazole (DIFLUCAN) 150 MG tablet Take one tablet by mouth for one dose. 09/25/18   Shelly Bombard, MD  FLUoxetine HCl (PROZAC PO) Take by mouth.    [provider]  fluticasone (FLONASE) 50 MCG/ACT nasal spray Place 2 sprays into both nostrils daily as needed for allergies or rhinitis.    [provider]  ibuprofen (ADVIL,MOTRIN) 800 MG tablet TAKE ONE TABLET BY MOUTH EVERY 8 HOURS 02/22/17   Shelly Bombard, MD  Levonorgest-Eth Estrad-Fe Bisg (BALCOLTRA) 0.1-20 MG-MCG(21) TABS Take 1 tablet by mouth daily. 10/03/17   Shelly Bombard, MD  loratadine (CLARITIN) 10 MG tablet Take 10 mg by mouth daily.    [provider]  metroNIDAZOLE (FLAGYL) 500 MG tablet Take 1 tablet (500 mg total) by mouth 2 (two) times daily for 7 days. 01/06/19 01/13/19  Fatima Blank, MD  montelukast (SINGULAIR) 10 MG tablet  Take 10 mg by mouth at bedtime.    [provider]  Multiple Vitamin (MULTIVITAMIN) tablet Take 1 tablet by mouth daily.    [provider]  Multiple Vitamins-Minerals (MULTIVITAMIN PO) Take 1 tablet by mouth every morning.    [provider]  Olopatadine HCl (PATANASE) 0.6 % SOLN Place 2 drops (2 puffs total) into both nostrils 2 (two) times daily. 11/03/16   Kennith Gain, MD  omeprazole (PRILOSEC) 40 MG capsule Take 40 mg by mouth daily.    [provider]  ondansetron (ZOFRAN ODT) 4 MG disintegrating  tablet 4mg  ODT q4 hours prn nausea/vomit 06/06/17   Jacqlyn Larsen, PA-C  potassium chloride SA (K-DUR,KLOR-CON) 20 MEQ tablet Take by mouth. 10/31/16   [provider]  Prenat-Fe Poly-Methfol-FA-DHA (VITAFOL ULTRA) 29-0.6-0.4-200 MG CAPS Take 1 capsule by mouth daily before breakfast. 11/01/18   Shelly Bombard, MD  SUPER B COMPLEX/C PO Take by mouth.    [provider]  vitamin B-12 (CYANOCOBALAMIN) 1000 MCG tablet Take 1,000 mcg by mouth daily.    [provider]  escitalopram (LEXAPRO) 10 MG tablet Take 10 mg by mouth daily.    09/02/11  [provider]                                                                                                                                    Past Surgical History Past Surgical History:  Procedure Laterality Date  . CHOLECYSTECTOMY N/A 01/15/2016   Procedure: LAPAROSCOPIC CHOLECYSTECTOMY WITH INTRAOPERATIVE CHOLANGIOGRAM;  Surgeon: Christene Lye, MD;  Location: [facility];  Service: General;  Laterality: N/A;  . COLONOSCOPY  2007   [facility]: pedunculated 70mm polyp in distal sigmoid, sessile 52mm polyp at splenic flexure, larger polyp tubulovillous adenoma and smaller polyp tubular adenoma   . COLONOSCOPY  09-09-15  . COLONOSCOPY WITH PROPOFOL N/A 03/01/2016   Procedure: COLONOSCOPY WITH PROPOFOL;  Surgeon: Garlan Fair, MD;  Location: [facility];  Service: Endoscopy;  Laterality: N/A;  . NASAL SEPTOPLASTY W/ TURBINOPLASTY  10/04/2011   Procedure: NASAL SEPTOPLASTY WITH TURBINATE REDUCTION;  Surgeon: Ascencion Dike, MD;  Location: [place];  Service: ENT;  Laterality: Bilateral;  . WISDOM TOOTH EXTRACTION  2009   Family History Family History  Problem Relation Age of Onset  . Kidney cancer Mother   . Hypertension Mother   . Migraines Mother   . Multiple sclerosis Mother   . Fibromyalgia Mother   . Liver disease Mother        NASH   . Hypertension Father   . Liver disease Father        liver  transplant; had fatty liver disease  . Spina bifida Brother   . Migraines Maternal Grandmother   . Hypertension Other   . Colon cancer Paternal Grandfather   . Allergic rhinitis Daughter   . Asthma Daughter   . Allergic rhinitis  Son   . Asthma Son   . Angioedema Neg Hx   . Eczema Neg Hx   . Immunodeficiency Neg Hx   . Urticaria Neg Hx     Social History Social History   Tobacco Use  . Smoking status: Former Smoker    Packs/day: 0.25    Years: 10.00    Pack years: 2.50    Quit date: 08/23/2008    Years since quitting: 10.3  . Smokeless tobacco: Never Used  Substance Use Topics  . Alcohol use: Yes    Alcohol/week: 0.0 standard drinks    Comment: ocassionally  . Drug use: No   Allergies Clarithromycin, Doxycycline, Bentyl [dicyclomine hcl], Haldol [haloperidol], Meclizine, Morphine and related, Toradol [ketorolac tromethamine], Macrobid [nitrofurantoin monohyd macro], and Sulfa antibiotics  Review of Systems Review of Systems  Constitutional: Negative for chills, fatigue and fever.  Respiratory: Negative for cough.   Gastrointestinal: Positive for abdominal pain. Negative for constipation, diarrhea, hematemesis, hematochezia, melena, nausea and vomiting.  Genitourinary: Positive for vaginal bleeding (spotting 2 days ago when period was suppose to start, then stopped.). Negative for dysuria, hematuria and vaginal discharge.   All other systems are reviewed and are negative for acute change except as noted in the HPI  Physical Exam Vital Signs  I have reviewed the triage vital signs BP (!) 153/91   Pulse 94   Temp 97.9 F (36.6 C) (Oral)   Resp 18   Ht 5\' 2"  (1.575 m)   Wt 98 kg   SpO2 98%   BMI 39.51 kg/m   Physical Exam Vitals signs reviewed. Exam conducted with a chaperone present.  Constitutional:      General: She is not in acute distress.    Appearance: She is well-developed. She is not diaphoretic.  HENT:     Head: Normocephalic and atraumatic.      Right Ear: External ear normal.     Left Ear: External ear normal.     Nose: Nose normal.  Eyes:     General: No scleral icterus.    Conjunctiva/sclera: Conjunctivae normal.  Neck:     Musculoskeletal: Normal range of motion.     Trachea: Phonation normal.  Cardiovascular:     Rate and Rhythm: Normal rate and regular rhythm.  Pulmonary:     Effort: Pulmonary effort is normal. No respiratory distress.     Breath sounds: No stridor.  Abdominal:     General: There is no distension.  Genitourinary:    Vagina: Vaginal discharge present.     Cervix: No cervical motion tenderness, discharge, friability, erythema or cervical bleeding.     Uterus: Tender.      Adnexa:        Right: No mass.         Left: No mass.    Musculoskeletal: Normal range of motion.  Neurological:     Mental Status: She is alert and oriented to person, place, and time.  Psychiatric:        Behavior: Behavior normal.     ED Results and Treatments Labs (all labs ordered are listed, but only abnormal results are displayed) Labs Reviewed  WET PREP, GENITAL - Abnormal; Notable for the following components:      Result Value   Clue Cells Wet Prep HPF POC PRESENT (*)    WBC, Wet Prep HPF POC MANY (*)    All other components within normal limits  URINALYSIS, ROUTINE W REFLEX MICROSCOPIC - Abnormal; Notable for the following components:  Hgb urine dipstick TRACE (*)    All other components within normal limits  CBC WITH DIFFERENTIAL/PLATELET - Abnormal; Notable for the following components:   Monocytes Absolute 1.1 (*)    All other components within normal limits  COMPREHENSIVE METABOLIC PANEL - Abnormal; Notable for the following components:   Potassium 3.3 (*)    Calcium 8.8 (*)    ALT 53 (*)    All other components within normal limits  URINALYSIS, MICROSCOPIC (REFLEX) - Abnormal; Notable for the following components:   Bacteria, UA MANY (*)    All other components within normal limits  LIPASE, BLOOD   GC/CHLAMYDIA PROBE AMP ([place]) NOT AT [facility]                                                                                                                         EKG  EKG Interpretation  Date/Time:    Ventricular Rate:    PR Interval:    QRS Duration:   QT Interval:    QTC Calculation:   R Axis:     Text Interpretation:        Radiology No results found.  Pertinent labs & imaging results that were available during my care of the patient were reviewed by me and considered in my medical decision making (see chart for details).  Medications Ordered in ED Medications - No data to display                                                                                                                                  Procedures Procedures  (including critical care time)  Medical Decision Making / ED Course I have reviewed the nursing notes for this encounter and the patient's prior records (if available in EHR or on provided paperwork).   Christina Fox was evaluated in Emergency Department on 01/06/2019 for the symptoms described in the history of present illness. She was evaluated in the context of the global COVID-19 pandemic, which necessitated consideration that the patient might be at risk for infection with the SARS-CoV-2 virus that causes COVID-19. Institutional protocols and algorithms that pertain to the evaluation of patients at risk for COVID-19 are in a state of rapid change based on information released by regulatory bodies including the CDC and federal and state organizations. These policies and algorithms were followed during the patient's care in the ED.  Suprapubic pressure. UPT that  MAU was negative -low suspicion for ectopic pregnancy GU exam notable for vaginal discharge without evidence of cervicitis or PID. Wet prep positive for bacterial vaginosis.  No yeast or trichomonas.  Low suspicion for GC/chlamydia requiring empiric treatment at this time. UA  not suspicious for urinary tract infection.  Rest of the labs are grossly reassuring.  Bacterial vaginosis treated with Flagyl.  The patient appears reasonably screened and/or stabilized for discharge and I doubt any other medical condition or other [facility] requiring further screening, evaluation, or treatment in the ED at this time prior to discharge.  The patient is safe for discharge with strict return precautions.        Final Clinical Impression(s) / ED Diagnoses Final diagnoses:  Bacterial vaginosis     The patient appears reasonably screened and/or stabilized for discharge and I doubt any other medical condition or other [facility] requiring further screening, evaluation, or treatment in the ED at this time prior to discharge.  Disposition: Discharge  Condition: Good  I have discussed the results, Dx and Tx plan with the patient who expressed understanding and agree(s) with the plan. Discharge instructions discussed at great length. The patient was given strict return precautions who verbalized understanding of the instructions. No further questions at time of discharge.    ED Discharge Orders         Ordered    metroNIDAZOLE (FLAGYL) 500 MG tablet  2 times daily     01/06/19 0001           Follow Up: Sherald Hess., MD [place] [phone]  Schedule an appointment as soon as possible for a visit  As needed     This chart was dictated using voice recognition software.  Despite best efforts to proofread,  errors can occur which can change the documentation meaning.   Fatima Blank, MD 01/06/19 [phone]

## 2019-01-06 MED ORDER — METRONIDAZOLE 500 MG PO TABS: 500.0000 mg | ORAL_TABLET | Freq: Two times a day (BID) | ORAL | 0 refills | Status: AC

## 2019-01-08 ENCOUNTER — Other Ambulatory Visit: Payer: Self-pay

## 2019-01-08 ENCOUNTER — Encounter: Payer: Self-pay | Admitting: Obstetrics & Gynecology

## 2019-01-08 ENCOUNTER — Ambulatory Visit: Payer: Medicaid Other | Admitting: Obstetrics & Gynecology

## 2019-01-08 VITALS — BP 145/83 | HR 108 | Temp 98.4°F | Wt 215.0 lb

## 2019-01-08 DIAGNOSIS — Z3202 Encounter for pregnancy test, result negative: Secondary | ICD-10-CM

## 2019-01-08 DIAGNOSIS — N926 Irregular menstruation, unspecified: Secondary | ICD-10-CM

## 2019-01-08 DIAGNOSIS — N912 Amenorrhea, unspecified: Secondary | ICD-10-CM | POA: Diagnosis not present

## 2019-01-08 LAB — GC/CHLAMYDIA PROBE AMP (~~LOC~~) NOT AT ARMC
Chlamydia: NEGATIVE
Neisseria Gonorrhea: NEGATIVE

## 2019-01-08 LAB — POCT URINE PREGNANCY: Preg Test, Ur: NEGATIVE

## 2019-01-08 NOTE — Progress Notes (Signed)
   Subjective:    Patient ID: Christina Fox, female    DOB: 15-Sep-1980, 38 y.o.   MRN: [id number]  HPI  38 yo G4 P4 is here for follow up after a MAU visit on 01-05-19. She went there for a pregnancy test. She reports monthly periods, but is now 2 days late so she wants another pregnancy test today. She is trying to conceive, has been taking PNVs.  She had been seen in the ED about 6 months ago for RLQ pain but that pain has resolved.  She has no other complaints today.  Review of Systems She had a normal pap smear 4/19.    Objective:   Physical Exam Breathing, conversing, and ambulating normally Well nourished, well hydrated White female, no apparent distress Abd- benign, obese Vulva/vagina- normal normal size and shape, anteverted, mobile, non-tender, normal adnexal exam     Assessment & Plan:  Late period- rec that if she does not start a period in the next 2 weeks, to come by for a pregnancy test.

## 2019-01-08 NOTE — Progress Notes (Signed)
RGYN pt presents for problem visit today.  CC: Pelvic pain per appt notes per pt pain was a few months ago none today *ED visit on 07/11/208  Pt notes LMP: 12/07/18 expected cycle on 12/05/18 had only spotting the day before still no cycle.  GC/CT: 01/05/19 Negative Last pap: 10/03/2017 WNL    UPT: NEGATIVE

## 2019-01-22 ENCOUNTER — Other Ambulatory Visit: Payer: Self-pay

## 2019-01-22 ENCOUNTER — Ambulatory Visit (INDEPENDENT_AMBULATORY_CARE_PROVIDER_SITE_OTHER): Payer: Medicaid Other

## 2019-01-22 DIAGNOSIS — N912 Amenorrhea, unspecified: Secondary | ICD-10-CM

## 2019-01-22 LAB — POCT URINE PREGNANCY: Preg Test, Ur: NEGATIVE

## 2019-01-22 NOTE — Progress Notes (Signed)
Pt is in the office for follow up UPT after 01-08-19 office visit. Pt reports LMP either 12-07-18 and has tender breasts. UPT today is negative. Pt requested to follow up with Dr. Jodi Mourning, provider advised that if pt has not had a cycle in a month, make an appt, pt agreed.

## 2019-06-24 ENCOUNTER — Ambulatory Visit: Payer: Medicaid Other

## 2019-08-17 ENCOUNTER — Inpatient Hospital Stay (HOSPITAL_COMMUNITY)
Admission: AD | Admit: 2019-08-17 | Discharge: 2019-08-18 | Disposition: A | Discharge status: Home / Self Care | Payer: Medicaid Other | Attending: Obstetrics & Gynecology | Admitting: Obstetrics & Gynecology

## 2019-08-17 ENCOUNTER — Other Ambulatory Visit: Payer: Self-pay

## 2019-08-17 DIAGNOSIS — Z825 Family history of asthma and other chronic lower respiratory diseases: Secondary | ICD-10-CM | POA: Insufficient documentation

## 2019-08-17 DIAGNOSIS — O26891 Other specified pregnancy related conditions, first trimester: Secondary | ICD-10-CM

## 2019-08-17 DIAGNOSIS — O99611 Diseases of the digestive system complicating pregnancy, first trimester: Secondary | ICD-10-CM | POA: Insufficient documentation

## 2019-08-17 DIAGNOSIS — Z3490 Encounter for supervision of normal pregnancy, unspecified, unspecified trimester: Secondary | ICD-10-CM

## 2019-08-17 DIAGNOSIS — Z87442 Personal history of urinary calculi: Secondary | ICD-10-CM | POA: Insufficient documentation

## 2019-08-17 DIAGNOSIS — Z8051 Family history of malignant neoplasm of kidney: Secondary | ICD-10-CM | POA: Insufficient documentation

## 2019-08-17 DIAGNOSIS — Z8379 Family history of other diseases of the digestive system: Secondary | ICD-10-CM | POA: Insufficient documentation

## 2019-08-17 DIAGNOSIS — Z885 Allergy status to narcotic agent status: Secondary | ICD-10-CM | POA: Insufficient documentation

## 2019-08-17 DIAGNOSIS — F419 Anxiety disorder, unspecified: Secondary | ICD-10-CM | POA: Insufficient documentation

## 2019-08-17 DIAGNOSIS — O3680X0 Pregnancy with inconclusive fetal viability, not applicable or unspecified: Secondary | ICD-10-CM | POA: Insufficient documentation

## 2019-08-17 DIAGNOSIS — Z882 Allergy status to sulfonamides status: Secondary | ICD-10-CM | POA: Insufficient documentation

## 2019-08-17 DIAGNOSIS — Z79899 Other long term (current) drug therapy: Secondary | ICD-10-CM | POA: Insufficient documentation

## 2019-08-17 DIAGNOSIS — R05 Cough: Secondary | ICD-10-CM | POA: Insufficient documentation

## 2019-08-17 DIAGNOSIS — Z881 Allergy status to other antibiotic agents status: Secondary | ICD-10-CM | POA: Insufficient documentation

## 2019-08-17 DIAGNOSIS — K219 Gastro-esophageal reflux disease without esophagitis: Secondary | ICD-10-CM | POA: Insufficient documentation

## 2019-08-17 DIAGNOSIS — Z8249 Family history of ischemic heart disease and other diseases of the circulatory system: Secondary | ICD-10-CM | POA: Insufficient documentation

## 2019-08-17 DIAGNOSIS — Z820 Family history of epilepsy and other diseases of the nervous system: Secondary | ICD-10-CM | POA: Insufficient documentation

## 2019-08-17 DIAGNOSIS — Z87891 Personal history of nicotine dependence: Secondary | ICD-10-CM | POA: Insufficient documentation

## 2019-08-17 DIAGNOSIS — Z3A09 9 weeks gestation of pregnancy: Secondary | ICD-10-CM | POA: Insufficient documentation

## 2019-08-17 DIAGNOSIS — Z888 Allergy status to other drugs, medicaments and biological substances status: Secondary | ICD-10-CM | POA: Insufficient documentation

## 2019-08-17 DIAGNOSIS — R109 Unspecified abdominal pain: Secondary | ICD-10-CM | POA: Insufficient documentation

## 2019-08-17 DIAGNOSIS — O99341 Other mental disorders complicating pregnancy, first trimester: Secondary | ICD-10-CM | POA: Insufficient documentation

## 2019-08-17 NOTE — MAU Note (Addendum)
Pt states she is pregnant. Pt reports left side pain that started tonight. Rates pain 7/10. Has not taken anything. Pt denies vaginal bleeding but has a white discharge. Denies itching or odor. Pt also reports a dry cough for almost a week. Has been using cough drops but not helping. Denies fever. No sick contacts. Had a COVID and Flu test on Wednesday at [facility], both of which patient reports were negative. LMP: 06/11/2019, but had some spotting 07/10/2019.

## 2019-08-18 ENCOUNTER — Inpatient Hospital Stay (HOSPITAL_COMMUNITY): Payer: Medicaid Other

## 2019-08-18 ENCOUNTER — Encounter (HOSPITAL_COMMUNITY): Payer: Self-pay

## 2019-08-18 DIAGNOSIS — Z87442 Personal history of urinary calculi: Secondary | ICD-10-CM | POA: Diagnosis not present

## 2019-08-18 DIAGNOSIS — K219 Gastro-esophageal reflux disease without esophagitis: Secondary | ICD-10-CM | POA: Diagnosis not present

## 2019-08-18 DIAGNOSIS — O99611 Diseases of the digestive system complicating pregnancy, first trimester: Secondary | ICD-10-CM | POA: Diagnosis not present

## 2019-08-18 DIAGNOSIS — O26891 Other specified pregnancy related conditions, first trimester: Secondary | ICD-10-CM | POA: Diagnosis not present

## 2019-08-18 DIAGNOSIS — Z87891 Personal history of nicotine dependence: Secondary | ICD-10-CM | POA: Diagnosis not present

## 2019-08-18 DIAGNOSIS — Z79899 Other long term (current) drug therapy: Secondary | ICD-10-CM | POA: Diagnosis not present

## 2019-08-18 DIAGNOSIS — O99341 Other mental disorders complicating pregnancy, first trimester: Secondary | ICD-10-CM | POA: Diagnosis not present

## 2019-08-18 DIAGNOSIS — Z825 Family history of asthma and other chronic lower respiratory diseases: Secondary | ICD-10-CM | POA: Diagnosis not present

## 2019-08-18 DIAGNOSIS — F419 Anxiety disorder, unspecified: Secondary | ICD-10-CM | POA: Diagnosis not present

## 2019-08-18 DIAGNOSIS — Z8051 Family history of malignant neoplasm of kidney: Secondary | ICD-10-CM | POA: Diagnosis not present

## 2019-08-18 DIAGNOSIS — Z882 Allergy status to sulfonamides status: Secondary | ICD-10-CM | POA: Diagnosis not present

## 2019-08-18 DIAGNOSIS — Z3A09 9 weeks gestation of pregnancy: Secondary | ICD-10-CM

## 2019-08-18 DIAGNOSIS — O3680X0 Pregnancy with inconclusive fetal viability, not applicable or unspecified: Secondary | ICD-10-CM | POA: Diagnosis not present

## 2019-08-18 DIAGNOSIS — R05 Cough: Secondary | ICD-10-CM | POA: Diagnosis not present

## 2019-08-18 DIAGNOSIS — Z820 Family history of epilepsy and other diseases of the nervous system: Secondary | ICD-10-CM | POA: Diagnosis not present

## 2019-08-18 DIAGNOSIS — Z8379 Family history of other diseases of the digestive system: Secondary | ICD-10-CM | POA: Diagnosis not present

## 2019-08-18 DIAGNOSIS — R109 Unspecified abdominal pain: Secondary | ICD-10-CM

## 2019-08-18 DIAGNOSIS — Z881 Allergy status to other antibiotic agents status: Secondary | ICD-10-CM | POA: Diagnosis not present

## 2019-08-18 DIAGNOSIS — Z885 Allergy status to narcotic agent status: Secondary | ICD-10-CM | POA: Diagnosis not present

## 2019-08-18 DIAGNOSIS — Z888 Allergy status to other drugs, medicaments and biological substances status: Secondary | ICD-10-CM | POA: Diagnosis not present

## 2019-08-18 DIAGNOSIS — Z8249 Family history of ischemic heart disease and other diseases of the circulatory system: Secondary | ICD-10-CM | POA: Diagnosis not present

## 2019-08-18 LAB — HCG, QUANTITATIVE, PREGNANCY: hCG, Beta Chain, Quant, S: 3002 m[IU]/mL — ABNORMAL HIGH (ref <5)

## 2019-08-18 LAB — WET PREP, GENITAL
Clue Cells Wet Prep HPF POC: NONE SEEN
Sperm: NONE SEEN
Trich, Wet Prep: NONE SEEN
Yeast Wet Prep HPF POC: NONE SEEN

## 2019-08-18 LAB — POCT PREGNANCY, URINE: Preg Test, Ur: POSITIVE — AB

## 2019-08-18 NOTE — Discharge Instructions (Signed)
Human Chorionic Gonadotropin Test Why am I having this test? A human chorionic gonadotropin (hCG) test is done to determine whether you are pregnant. It can also be used:  To diagnose an abnormal pregnancy.  To determine whether you have had a failed pregnancy (miscarriage) or are at risk of one. What is being tested? This test checks the level of the human chorionic gonadotropin (hCG) hormone in the blood. This hormone is produced during pregnancy by the cells that form the placenta. The placenta is the organ that grows inside your womb (uterus) to nourish a developing baby. When you are pregnant, hCG can be detected in your blood or urine 7 to 8 days before your missed period. It continues to go up for the first 8-10 weeks of pregnancy. The presence of hCG in your blood can be measured with several different types of tests. You may have:  A urine test. ? Because this hormone is eliminated from your body by your kidneys, you may have a urine test to find out whether you are pregnant. A home pregnancy test detects whether there is hCG in your urine. ? A urine test only shows whether there is hCG in your urine. It does not measure how much.  A qualitative blood test. ? You may have this type of blood test to find out if you are pregnant. ? This blood test only shows whether there is hCG in your blood. It does not measure how much.  A quantitative blood test. ? This type of blood test measures the amount of hCG in your blood. ? You may have this test to:  Diagnose an abnormal pregnancy.  Check whether you have had a miscarriage.  Determine whether you are at risk of a miscarriage. What kind of sample is taken?     Two kinds of samples may be collected to test for the hCG hormone.  Blood. It is usually collected by inserting a needle into a blood vessel.  Urine. It is usually collected by urinating into a germ-free (sterile) specimen cup. It is best to collect the sample the first  time you urinate in the morning. How do I prepare for this test? No preparation is needed for a blood test.  For the urine test:  Let your health care provider know about: ? All medicines you are taking, including vitamins, herbs, creams, and over-the-counter medicines. ? Any blood in your urine. This may interfere with the result.  Do not drink too much fluid. Drink as you normally would, or as directed by your health care provider. How are the results reported? Depending on the type of test that you have, your test results may be reported as values. Your health care provider will compare your results to normal ranges that were established after testing a large group of people (reference ranges). Reference ranges may vary among labs and hospitals. For this test, common reference ranges that show absence of pregnancy are:  Quantitative hCG blood levels: less than 5 IU/L. Other results will be reported as either positive or negative. For this test, normal results (meaning the absence of pregnancy) are:  Negative for hCG in the urine test.  Negative for hCG in the qualitative blood test. What do the results mean? Urine and qualitative blood test  A negative result could mean: ? That you are not pregnant. ? That the test was done too early in your pregnancy to detect hCG in your blood or urine. If you still have other signs   of pregnancy, the test will be repeated.  A positive result means: ? That you are most likely pregnant. Your health care provider may confirm your pregnancy with an imaging study (ultrasound) of your uterus, if needed. Quantitative blood test Results of the quantitative hCG blood test will be interpreted as follows:  Less than 5 IU/L: You are most likely not pregnant.  Greater than 25 IU/L: You are most likely pregnant.  hCG levels that are higher than expected: ? You are pregnant with twins. ? You have abnormal growths in the uterus.  hCG levels that are  rising more slowly than expected: ? You have an ectopic pregnancy (also called a tubal pregnancy).  hCG levels that are falling: ? You may be having a miscarriage. Talk with your health care provider about what your results mean. Questions to ask your health care provider Ask your health care provider, or the department that is doing the test:  When will my results be ready?  How will I get my results?  What are my treatment options?  What other tests do I need?  What are my next steps? Summary  A human chorionic gonadotropin test is done to determine whether you are pregnant.  When you are pregnant, hCG can be detected in your blood or urine 7 to 8 days before your missed period. It continues to go up for the first 8-10 weeks of pregnancy.  Your hCG level can be measured with different types of tests. You may have a urine test, a qualitative blood test, or a quantitative blood test.  Talk with your health care provider about what your results mean. This information is not intended to replace advice given to you by your health care provider. Make sure you discuss any questions you have with your health care provider. Document Revised: 05/15/2017 Document Reviewed: 05/15/2017 Elsevier Patient Education  2020 [employer]  

## 2019-08-18 NOTE — MAU Provider Note (Signed)
History     CSN: [id number]  Arrival date and time: 08/17/19 2311   First Provider Initiated Contact with Patient 08/18/19 0125      Chief Complaint  Patient presents with  . Abdominal Pain  . Cough   Christina Fox is a 39 y.o. [id number] at [redacted]w[redacted]d by Definite LMP of 06/11/2019.  However, patient states she had spotting for a day on Jul 09, 2018. She plans to establish care at [facility]. She presents today for Abdominal Pain.  She reports she started having left side abdominal pain around 10pm tonight.  She states the pain is constant and describes it as "crampy feeling...like an achy cramping."  She reports the pain is not worsened by any known factors, but is relieved by positioning.  Patient rates the pain a 7/10.  Patient states she noticed a few days ago that the pain was a sharp shooting pain, but it was no worse than her current pain.  She endorses vaginal discharge, but denies odor, itching, or burning.  She denies vaginal bleeding or sexual activity in the past 3 days.     Patient states that she has had a cough since last Sunday and has been tested for Covid and the flu, which was both negative on Wednesday.  She states she has not taken anything for the cough and denies a history of asthma.       OB History    Gravida  5   Para  4   Term  4   Preterm      AB      Living  4     SAB      TAB      Ectopic      Multiple      Live Births  4        Obstetric Comments  1st Menstrual Cycle:  10  1st Pregnancy:  16         Past Medical History:  Diagnosis Date  . Abdominal pain    "right lower quadrant pain and right lower back pain  . Anxiety   . Deviated septum 09/2011  . GERD (gastroesophageal reflux disease)   . Headache(784.0)    sinus  . History of echocardiogram    Echo 11/17: EF 55-60, no RWMA, normal diastolic function  . Hypertension    no meds  . Kidney stones    no current problem  . Migraine   . 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  . Pelvic inflammatory disease   . Urticaria     Past Surgical History:  Procedure Laterality Date  . CHOLECYSTECTOMY N/A 01/15/2016   Procedure: LAPAROSCOPIC CHOLECYSTECTOMY WITH INTRAOPERATIVE CHOLANGIOGRAM;  Surgeon: Christene Lye, MD;  Location: [facility];  Service: General;  Laterality: N/A;  . COLONOSCOPY  2007   [facility]: pedunculated 46mm polyp in distal sigmoid, sessile 60mm polyp at splenic flexure, larger polyp tubulovillous adenoma and smaller polyp tubular adenoma   . COLONOSCOPY  09-09-15  . COLONOSCOPY WITH PROPOFOL N/A 03/01/2016   Procedure: COLONOSCOPY WITH PROPOFOL;  Surgeon: Garlan Fair, MD;  Location: [facility];  Service: Endoscopy;  Laterality: N/A;  . NASAL SEPTOPLASTY W/ TURBINOPLASTY  10/04/2011   Procedure: NASAL SEPTOPLASTY WITH TURBINATE REDUCTION;  Surgeon: Ascencion Dike, MD;  Location: [place];  Service: ENT;  Laterality: Bilateral;  . WISDOM TOOTH EXTRACTION  2009    Family History  Problem Relation Age of Onset  . Kidney  cancer Mother   . Hypertension Mother   . Migraines Mother   . Multiple sclerosis Mother   . Fibromyalgia Mother   . Liver disease Mother        NASH   . Hypertension Father   . Liver disease Father        liver transplant; had fatty liver disease  . Spina bifida Brother   . Migraines Maternal Grandmother   . Hypertension Other   . Colon cancer Paternal Grandfather   . Allergic rhinitis Daughter   . Asthma Daughter   . Allergic rhinitis Son   . Asthma Son   . Angioedema Neg Hx   . Eczema Neg Hx   . Immunodeficiency Neg Hx   . Urticaria Neg Hx     Social History   Tobacco Use  . Smoking status: Former Smoker    Packs/day: 0.25    Years: 10.00    Pack years: 2.50    Quit date: 08/23/2008    Years since quitting: 10.9  . Smokeless tobacco: Never Used  Substance Use Topics  . Alcohol use: Yes    Alcohol/week: 0.0 standard drinks    Comment: ocassionally  . Drug use: No     Allergies:  Allergies  Allergen Reactions  . Clarithromycin Hives and Itching  . Doxycycline Shortness Of Breath    Chest pain  . Bentyl [Dicyclomine Hcl] Other (See Comments)    DIZZINESS  . Haldol [Haloperidol] Other (See Comments)    Made her feel very jittery and agitated  . Meclizine     Chest pain  . Morphine And Related Other (See Comments)    Made her feel like she was losing her mind  . Toradol [Ketorolac Tromethamine] Other (See Comments)    Made her feel very jittery and agitated  . Macrobid [Nitrofurantoin Monohyd Macro] Rash  . Meclizine Hcl Rash  . Nitrofurantoin Rash  . Sulfa Antibiotics Hives and Rash    Medications Prior to Admission  Medication Sig Dispense Refill Last Dose  . cholecalciferol (VITAMIN D) 1000 units tablet Take 1,000 Units by mouth daily.   08/17/2019 at Unknown time  . clonazePAM (KLONOPIN) 0.5 MG tablet Take 0.25 mg by mouth daily.    08/18/2019 at Unknown time  . loratadine (CLARITIN) 10 MG tablet Take 10 mg by mouth daily.   08/18/2019 at Unknown time  . omeprazole (PRILOSEC) 40 MG capsule Take 40 mg by mouth daily.   08/18/2019 at Unknown time  . Prenat-Fe Poly-Methfol-FA-DHA (VITAFOL ULTRA) 29-0.6-0.4-200 MG CAPS Take 1 capsule by mouth daily before breakfast. 30 capsule 11 08/18/2019 at Unknown time  . acetaminophen (TYLENOL) 500 MG tablet Take 1 tablet (500 mg total) by mouth every 6 (six) hours as needed. (Patient taking differently: Take 1,000 mg by mouth every 8 (eight) hours as needed. ) 30 tablet 0   . ascorbic acid (VITAMIN C) 500 MG tablet Take 500 mg by mouth daily.     Marland Kitchen azithromycin (ZITHROMAX) 250 MG tablet Take 1 tablet (250 mg total) by mouth daily. Take 1 tablet daily for 6 days 6 tablet 0   . fluconazole (DIFLUCAN) 150 MG tablet Take one tablet by mouth for one dose. 1 tablet 0   . FLUoxetine HCl (PROZAC PO) Take by mouth.     . fluticasone (FLONASE) 50 MCG/ACT nasal spray Place 2 sprays into both nostrils daily as needed for  allergies or rhinitis.     Marland Kitchen ibuprofen (ADVIL,MOTRIN) 800 MG tablet TAKE ONE TABLET  BY MOUTH EVERY 8 HOURS 30 tablet 5   . Levonorgest-Eth Estrad-Fe Bisg (BALCOLTRA) 0.1-20 MG-MCG(21) TABS Take 1 tablet by mouth daily. 28 tablet 11   . montelukast (SINGULAIR) 10 MG tablet Take 10 mg by mouth at bedtime.     . Multiple Vitamin (MULTIVITAMIN) tablet Take 1 tablet by mouth daily.     . Multiple Vitamins-Minerals (MULTIVITAMIN PO) Take 1 tablet by mouth every morning.     . Olopatadine HCl (PATANASE) 0.6 % SOLN Place 2 drops (2 puffs total) into both nostrils 2 (two) times daily. 30.5 g 5   . ondansetron (ZOFRAN ODT) 4 MG disintegrating tablet 4mg  ODT q4 hours prn nausea/vomit 8 tablet 0   . potassium chloride SA (K-DUR,KLOR-CON) 20 MEQ tablet Take by mouth.     . SUPER B COMPLEX/C PO Take by mouth.     . vitamin B-12 (CYANOCOBALAMIN) 1000 MCG tablet Take 1,000 mcg by mouth daily.       Review of Systems  Constitutional: Negative for chills and fever.  Respiratory: Positive for cough and shortness of breath. Negative for chest tightness.   Cardiovascular: Negative for chest pain.  Gastrointestinal: Positive for abdominal pain and nausea. Negative for vomiting.  Genitourinary: Positive for vaginal discharge. Negative for difficulty urinating, dysuria, pelvic pain and vaginal bleeding.  Neurological: Negative for dizziness, light-headedness and headaches.   Physical Exam   Blood pressure 110/71, pulse (!) 102, temperature 97.8 F (36.6 C), temperature source Oral, resp. rate 18, height 5\' 2"  (1.575 m), weight 99.7 kg, last menstrual period 06/11/2019, SpO2 98 %.  Physical Exam  Constitutional: She is oriented to person, place, and time. She appears well-developed.  HENT:  Head: Normocephalic and atraumatic.  Eyes: Conjunctivae are normal.  Cardiovascular: Normal rate, regular rhythm and normal heart sounds.  Respiratory: Effort normal and breath sounds normal.  GI: Soft. There is abdominal  tenderness in the right lower quadrant and left lower quadrant. There is no guarding.  Genitourinary: Uterus is not enlarged. Cervix exhibits motion tenderness.    No vaginal discharge or bleeding.  No bleeding in the vagina.    Genitourinary Comments: Speculum Exam: -Normal External Genitalia: Non tender, no apparent discharge at introitus.  -Vaginal Vault: Pink mucosa with good rugae. Scant amt thin white discharge -wet prep collected -Cervix:Pink, no lesions, cysts, or polyps.  Appears closed. No active bleeding from os-GC/CT collected -Bimanual Exam:  Some CMT with exam.  Uterus not apparently enlarged but also difficult to assess d/t body habitus. Tenderness in suprapubic and bilateral lower quadrants.     Musculoskeletal:        General: Normal range of motion.     Cervical back: Normal range of motion.  Neurological: She is alert and oriented to person, place, and time.  Skin: Skin is warm and dry.  Psychiatric: She has a normal mood and affect. Her behavior is normal.    MAU Course  Procedures Results for orders placed or performed during the hospital encounter of 08/17/19 (from the past 24 hour(s))  Pregnancy, urine POC     Status: Abnormal   Collection Time: 08/18/19 12:23 AM  Result Value Ref Range   Preg Test, Ur POSITIVE (A) NEGATIVE  Wet prep, genital     Status: Abnormal   Collection Time: 08/18/19  1:41 AM  Result Value Ref Range   Yeast Wet Prep HPF POC NONE SEEN NONE SEEN   Trich, Wet Prep NONE SEEN NONE SEEN   Clue Cells Wet Prep HPF POC  NONE SEEN NONE SEEN   WBC, Wet Prep HPF POC MANY (A) NONE SEEN   Sperm NONE SEEN   hCG, quantitative, pregnancy     Status: Abnormal   Collection Time: 08/18/19  1:56 AM  Result Value Ref Range   hCG, Beta Chain, Quant, S 3,002 (H) <5 mIU/mL   US OB LESS THAN 14 WEEKS WITH OB TRANSVAGINAL  Result Date: 08/18/2019 CLINICAL DATA:  Pain EXAM: OBSTETRIC <14 WK [place] AND TRANSVAGINAL OB US TECHNIQUE: Both transabdominal and  transvaginal ultrasound examinations were performed for complete evaluation of the gestation as well as the maternal uterus, adnexal regions, and pelvic cul-de-sac. Transvaginal technique was performed to assess early pregnancy. COMPARISON:  None. FINDINGS: Intrauterine gestational sac: Single Yolk sac:  Not Visualized. Embryo:  Not Visualized. Cardiac Activity: Not Visualized. MSD: 4.1 mm   5 w   1 d Subchorionic hemorrhage:  None visualized. Maternal uterus/adnexae: There is a probable corpus luteal cyst involving the right ovary. There is no significant fluid in the patient's pelvis. IMPRESSION: Probable early intrauterine gestational sac, but no yolk sac, fetal pole, or cardiac activity yet visualized. Recommend follow-up quantitative B-HCG levels and follow-up US in 14 days to assess viability. This recommendation follows SRU consensus guidelines: Diagnostic Criteria for Nonviable Pregnancy Early in the First Trimester. [employer] Cor[place] Med [id number]. Electronically Signed   By: Constance Holster M.D.   On: 08/18/2019 02:58    MDM Pelvic Exam; Wet Prep and GC/CT Labs: UA, UPT, CBC, hCG, ABO Ultrasound  Assessment and Plan  39 year old G5P4004 at 9.5 weeks Pregnancy of Unknown Anatomical Location Abdominal Pain  -Reviewed POC with patient. -Patient informed that c/o cough is heard and discussed usage of cough medications for treatment.  -Exam performed and findings discussed.  -Cultures collected and pending.  -Labs ordered -Patient offered and accepts pain medication. -Will give tylenol XR now. -Will send for [place].  Fraser Din Jacquelyn Shadrick 08/18/2019, 1:25 AM   Reassessment (3:15 AM) IUGS  -Lab and [place] return as above. -Patient informed of results.  -Patient questions if she should base LMP on Jan 13th.  Instructed that follow up labs would be performed as well as [place] if appropriate which will determine EDD.  -Scheduled for follow up on Tuesday Feb 23 at 1315 at [facility]. -Bleeding Precautions  Given. -Will give list of pregnancy safe medications for treatment of cough.  -Patient without questions or concerns. -Encouraged to call or return to MAU if symptoms worsen or with the onset of new symptoms. -Discharged to home in stable condition.  Maryann Conners MSN, CNM Advanced Practice Provider, [facility]

## 2019-08-19 LAB — GC/CHLAMYDIA PROBE AMP (~~LOC~~) NOT AT ARMC
Chlamydia: NEGATIVE
Comment: NEGATIVE
Comment: NORMAL
Neisseria Gonorrhea: NEGATIVE

## 2019-08-20 ENCOUNTER — Other Ambulatory Visit: Payer: Self-pay | Admitting: Obstetrics and Gynecology

## 2019-08-20 ENCOUNTER — Ambulatory Visit (INDEPENDENT_AMBULATORY_CARE_PROVIDER_SITE_OTHER): Payer: Medicaid Other

## 2019-08-20 ENCOUNTER — Other Ambulatory Visit: Payer: Self-pay

## 2019-08-20 VITALS — BP 130/85 | HR 116 | Temp 98.2°F | Ht 62.0 in | Wt 219.0 lb

## 2019-08-20 DIAGNOSIS — Z3A10 10 weeks gestation of pregnancy: Secondary | ICD-10-CM

## 2019-08-20 DIAGNOSIS — Z3201 Encounter for pregnancy test, result positive: Secondary | ICD-10-CM

## 2019-08-20 DIAGNOSIS — Z3200 Encounter for pregnancy test, result unknown: Secondary | ICD-10-CM

## 2019-08-20 DIAGNOSIS — O3680X0 Pregnancy with inconclusive fetal viability, not applicable or unspecified: Secondary | ICD-10-CM

## 2019-08-20 NOTE — Progress Notes (Signed)
Presents for STAT Beta Hcg.  Patient denies pain, cramping, bleeding or LOF.  PLAN STAT Beta HCG drawn and sent to Lab. Results will be sent to on call Doctor. Follow up US on 09/01/2019

## 2019-08-20 NOTE — Progress Notes (Signed)
Patient seen and assessed by nursing staff during this encounter. I have reviewed the chart and agree with the documentation and plan.  Mora Bellman, MD 08/20/2019 3:11 PM

## 2019-08-21 LAB — BETA HCG QUANT (REF LAB): hCG Quant: 2995 m[IU]/mL

## 2019-08-22 ENCOUNTER — Other Ambulatory Visit: Payer: Self-pay

## 2019-08-22 ENCOUNTER — Other Ambulatory Visit: Payer: Medicaid Other

## 2019-08-22 DIAGNOSIS — O3680X0 Pregnancy with inconclusive fetal viability, not applicable or unspecified: Secondary | ICD-10-CM

## 2019-08-23 ENCOUNTER — Inpatient Hospital Stay (HOSPITAL_COMMUNITY)
Admission: AD | Admit: 2019-08-23 | Discharge: 2019-08-23 | Disposition: A | Discharge status: Home / Self Care | Payer: Medicaid Other | Attending: Obstetrics and Gynecology | Admitting: Obstetrics and Gynecology

## 2019-08-23 ENCOUNTER — Other Ambulatory Visit: Payer: Self-pay

## 2019-08-23 ENCOUNTER — Telehealth: Payer: Self-pay

## 2019-08-23 ENCOUNTER — Inpatient Hospital Stay (HOSPITAL_COMMUNITY): Payer: Medicaid Other

## 2019-08-23 DIAGNOSIS — O26891 Other specified pregnancy related conditions, first trimester: Secondary | ICD-10-CM | POA: Insufficient documentation

## 2019-08-23 DIAGNOSIS — Z3A10 10 weeks gestation of pregnancy: Secondary | ICD-10-CM | POA: Diagnosis not present

## 2019-08-23 DIAGNOSIS — O09521 Supervision of elderly multigravida, first trimester: Secondary | ICD-10-CM | POA: Insufficient documentation

## 2019-08-23 DIAGNOSIS — Z87891 Personal history of nicotine dependence: Secondary | ICD-10-CM | POA: Diagnosis not present

## 2019-08-23 DIAGNOSIS — O3680X0 Pregnancy with inconclusive fetal viability, not applicable or unspecified: Secondary | ICD-10-CM

## 2019-08-23 LAB — BETA HCG QUANT (REF LAB): hCG Quant: 3094 m[IU]/mL

## 2019-08-23 NOTE — MAU Provider Note (Signed)
History     CSN: [id number]  Arrival date and time: 08/23/19 1315   First Provider Initiated Contact with Patient 08/23/19 1415      Chief Complaint  Patient presents with  . Follow-up   HPI Christina Fox is a 39 y.o. G5P4004 at [redacted]w[redacted]d who presents to MAU for repeat ultrasound following abnormal rise in quant hCG. She denies abdominal cramping, vaginal bleeding, abdominal tenderness, fever or recent illness. She endorses mild nausea and breast tenderness.  OB History    Gravida  5   Para  4   Term  4   Preterm      AB      Living  4     SAB      TAB      Ectopic      Multiple      Live Births  4        Obstetric Comments  1st Menstrual Cycle:  10  1st Pregnancy:  16         Past Medical History:  Diagnosis Date  . Abdominal pain    "right lower quadrant pain and right lower back pain  . Anxiety   . Deviated septum 09/2011  . GERD (gastroesophageal reflux disease)   . Headache(784.0)    sinus  . History of echocardiogram    Echo 11/17: EF 55-60, no RWMA, normal diastolic function  . Hypertension    no meds  . Kidney stones    no current problem  . Migraine   . Nasal turbinate hypertrophy 09/2011   bilat.  . Pelvic inflammatory disease   . Urticaria     Past Surgical History:  Procedure Laterality Date  . CHOLECYSTECTOMY N/A 01/15/2016   Procedure: LAPAROSCOPIC CHOLECYSTECTOMY WITH INTRAOPERATIVE CHOLANGIOGRAM;  Surgeon: Christene Lye, MD;  Location: [facility];  Service: General;  Laterality: N/A;  . COLONOSCOPY  2007   [facility]: pedunculated 82mm polyp in distal sigmoid, sessile 77mm polyp at splenic flexure, larger polyp tubulovillous adenoma and smaller polyp tubular adenoma   . COLONOSCOPY  09-09-15  . COLONOSCOPY WITH PROPOFOL N/A 03/01/2016   Procedure: COLONOSCOPY WITH PROPOFOL;  Surgeon: Garlan Fair, MD;  Location: [facility];  Service: Endoscopy;  Laterality: N/A;  . NASAL SEPTOPLASTY W/ TURBINOPLASTY  10/04/2011    Procedure: NASAL SEPTOPLASTY WITH TURBINATE REDUCTION;  Surgeon: Ascencion Dike, MD;  Location: [place];  Service: ENT;  Laterality: Bilateral;  . WISDOM TOOTH EXTRACTION  2009    Family History  Problem Relation Age of Onset  . Kidney cancer Mother   . Hypertension Mother   . Migraines Mother   . Multiple sclerosis Mother   . Fibromyalgia Mother   . Liver disease Mother        NASH   . Hypertension Father   . Liver disease Father        liver transplant; had fatty liver disease  . Spina bifida Brother   . Migraines Maternal Grandmother   . Hypertension Other   . Colon cancer Paternal Grandfather   . Allergic rhinitis Daughter   . Asthma Daughter   . Allergic rhinitis Son   . Asthma Son   . Angioedema Neg Hx   . Eczema Neg Hx   . Immunodeficiency Neg Hx   . Urticaria Neg Hx     Social History   Tobacco Use  . Smoking status: Former Smoker    Packs/day: 0.25    Years: 10.00  Pack years: 2.50    Quit date: 08/23/2008    Years since quitting: 11.0  . Smokeless tobacco: Never Used  Substance Use Topics  . Alcohol use: Yes    Alcohol/week: 0.0 standard drinks    Comment: ocassionally  . Drug use: No    Allergies:  Allergies  Allergen Reactions  . Clarithromycin Hives and Itching  . Doxycycline Shortness Of Breath    Chest pain  . Bentyl [Dicyclomine Hcl] Other (See Comments)    DIZZINESS  . Haldol [Haloperidol] Other (See Comments)    Made her feel very jittery and agitated  . Meclizine     Chest pain  . Morphine And Related Other (See Comments)    Made her feel like she was losing her mind  . Toradol [Ketorolac Tromethamine] Other (See Comments)    Made her feel very jittery and agitated  . Macrobid [Nitrofurantoin Monohyd Macro] Rash  . Meclizine Hcl Rash  . Nitrofurantoin Rash  . Sulfa Antibiotics Hives and Rash    No medications prior to admission.    Review of Systems  Constitutional: Negative for chills, fatigue and fever.   Respiratory: Negative for shortness of breath.   Gastrointestinal: Negative for abdominal pain.  Genitourinary: Negative for vaginal bleeding, vaginal discharge and vaginal pain.  Musculoskeletal: Negative for back pain.  All other systems reviewed and are negative.  Physical Exam   Blood pressure 127/81, pulse (!) 124, temperature 98.4 F (36.9 C), temperature source Oral, resp. rate 18, height 5\' 2"  (1.575 m), weight 98.7 kg, last menstrual period 06/11/2019, SpO2 100 %.  Physical Exam  Nursing note and vitals reviewed. Constitutional: She is oriented to person, place, and time. She appears well-developed and well-nourished.  Cardiovascular: Normal rate.  Respiratory: Effort normal and breath sounds normal.  GI: Soft. She exhibits no distension. There is no abdominal tenderness. There is no rebound and no guarding.  Neurological: She is alert and oriented to person, place, and time.  Skin: Skin is warm and dry.  Psychiatric: She has a normal mood and affect. Her behavior is normal. Judgment and thought content normal.    MAU Course  Procedures  Patient Vitals for the past 24 hrs:  BP Temp Temp src Pulse Resp SpO2 Height Weight  08/23/19 1329 127/81 98.4 F (36.9 C) Oral (!) 124 18 100 % 5\' 2"  (1.575 m) 98.7 kg   US OB Transvaginal  Result Date: 08/23/2019 CLINICAL DATA:  Abnormal serum beta hCG trending EXAM: TRANSVAGINAL OB ULTRASOUND TECHNIQUE: Transvaginal ultrasound was performed for complete evaluation of the gestation as well as the maternal uterus, adnexal regions, and pelvic cul-de-sac. COMPARISON:  Ultrasound 08/18/2019 FINDINGS: Intrauterine gestational sac: Single Yolk sac:  Not Visualized. Embryo:  Not Visualized. Cardiac Activity: Not Visualized. MSD: 6.3 mm   5 w   2 d Mean sac diameter previously measured 4.1 mm on 08/18/2019 Subchorionic hemorrhage:  None visualized. Maternal uterus/adnexae: Right ovary measures 2.6 x 1.8 x 1.5 cm and appears unremarkable. Left  ovary measures 2.0 x 1.2 x 1.5 cm and appears unremarkable. No free fluid within the pelvis. IMPRESSION: Probable early intrauterine gestational sac measuring slightly larger on today's study compared to previous ultrasound 08/18/2019. However, there is no yolk sac or fetal pole visualized. Recommend follow-up quantitative B-HCG levels and follow-up US in 14 days to assess viability. This recommendation follows SRU consensus guidelines: Diagnostic Criteria for Nonviable Pregnancy Early in the First Trimester. [employer]ta Cor[place] Med [id number]. Electronically Signed   By: Hart Carwin  Plundo D.O.   On: 08/23/2019 14:06   Inappropriate rise in Quant hCG: --3,002 on 08/18/2019 --2,995 on 08/20/2019 --3,094 on 08/02/2019  Reviewed with Dr. Rip Harbour, who advises repeat quant hCG tomorrow 02/27 (at 48 hour interval). Patient to keep existing appointment for repeat ultrasound on 09/03/2019  Assessment and Plan  --39 y.o. G5P4004 at [redacted]w[redacted]d with pregnancy of unknown location --Discharge home in stable condition with strict ectopic precautions  Darlina Rumpf, CNM 08/23/2019, 4:17 PM

## 2019-08-23 NOTE — Telephone Encounter (Signed)
Pt called asking about her HCG results from yesterday and would like to know recommendations.

## 2019-08-23 NOTE — MAU Note (Signed)
Pt sent from [facility] for repeat ultrasound. Pt had repeat HCG and states that her levels are not rising appropriately. Denies bleeding or pain.

## 2019-08-23 NOTE — Telephone Encounter (Signed)
Pt notified of HCG result and Kerry Hough, PA recommendation that she should go to MAU today for [place] to see why HCG levels aren't really changing. Pt denies any pain or bleeding at this time. Pt verbalizes understanding and says she will go.

## 2019-08-23 NOTE — Discharge Instructions (Signed)
Human Chorionic Gonadotropin Test Why am I having this test? A human chorionic gonadotropin (hCG) test is done to determine whether you are pregnant. It can also be used:  To diagnose an abnormal pregnancy.  To determine whether you have had a failed pregnancy (miscarriage) or are at risk of one. What is being tested? This test checks the level of the human chorionic gonadotropin (hCG) hormone in the blood. This hormone is produced during pregnancy by the cells that form the placenta. The placenta is the organ that grows inside your womb (uterus) to nourish a developing baby. When you are pregnant, hCG can be detected in your blood or urine 7 to 8 days before your missed period. It continues to go up for the first 8-10 weeks of pregnancy. The presence of hCG in your blood can be measured with several different types of tests. You may have:  A urine test. ? Because this hormone is eliminated from your body by your kidneys, you may have a urine test to find out whether you are pregnant. A home pregnancy test detects whether there is hCG in your urine. ? A urine test only shows whether there is hCG in your urine. It does not measure how much.  A qualitative blood test. ? You may have this type of blood test to find out if you are pregnant. ? This blood test only shows whether there is hCG in your blood. It does not measure how much.  A quantitative blood test. ? This type of blood test measures the amount of hCG in your blood. ? You may have this test to:  Diagnose an abnormal pregnancy.  Check whether you have had a miscarriage.  Determine whether you are at risk of a miscarriage. What kind of sample is taken?     Two kinds of samples may be collected to test for the hCG hormone.  Blood. It is usually collected by inserting a needle into a blood vessel.  Urine. It is usually collected by urinating into a germ-free (sterile) specimen cup. It is best to collect the sample the first  time you urinate in the morning. How do I prepare for this test? No preparation is needed for a blood test.  For the urine test:  Let your health care provider know about: ? All medicines you are taking, including vitamins, herbs, creams, and over-the-counter medicines. ? Any blood in your urine. This may interfere with the result.  Do not drink too much fluid. Drink as you normally would, or as directed by your health care provider. How are the results reported? Depending on the type of test that you have, your test results may be reported as values. Your health care provider will compare your results to normal ranges that were established after testing a large group of people (reference ranges). Reference ranges may vary among labs and hospitals. For this test, common reference ranges that show absence of pregnancy are:  Quantitative hCG blood levels: less than 5 IU/L. Other results will be reported as either positive or negative. For this test, normal results (meaning the absence of pregnancy) are:  Negative for hCG in the urine test.  Negative for hCG in the qualitative blood test. What do the results mean? Urine and qualitative blood test  A negative result could mean: ? That you are not pregnant. ? That the test was done too early in your pregnancy to detect hCG in your blood or urine. If you still have other signs   of pregnancy, the test will be repeated.  A positive result means: ? That you are most likely pregnant. Your health care provider may confirm your pregnancy with an imaging study (ultrasound) of your uterus, if needed. Quantitative blood test Results of the quantitative hCG blood test will be interpreted as follows:  Less than 5 IU/L: You are most likely not pregnant.  Greater than 25 IU/L: You are most likely pregnant.  hCG levels that are higher than expected: ? You are pregnant with twins. ? You have abnormal growths in the uterus.  hCG levels that are  rising more slowly than expected: ? You have an ectopic pregnancy (also called a tubal pregnancy).  hCG levels that are falling: ? You may be having a miscarriage. Talk with your health care provider about what your results mean. Questions to ask your health care provider Ask your health care provider, or the department that is doing the test:  When will my results be ready?  How will I get my results?  What are my treatment options?  What other tests do I need?  What are my next steps? Summary  A human chorionic gonadotropin test is done to determine whether you are pregnant.  When you are pregnant, hCG can be detected in your blood or urine 7 to 8 days before your missed period. It continues to go up for the first 8-10 weeks of pregnancy.  Your hCG level can be measured with different types of tests. You may have a urine test, a qualitative blood test, or a quantitative blood test.  Talk with your health care provider about what your results mean. This information is not intended to replace advice given to you by your health care provider. Make sure you discuss any questions you have with your health care provider. Document Revised: 05/15/2017 Document Reviewed: 05/15/2017 Elsevier Patient Education  2020 [employer]  

## 2019-08-24 ENCOUNTER — Other Ambulatory Visit: Payer: Self-pay

## 2019-08-24 ENCOUNTER — Inpatient Hospital Stay (HOSPITAL_COMMUNITY)
Admission: AD | Admit: 2019-08-24 | Discharge: 2019-08-24 | Disposition: A | Discharge status: Home / Self Care | Payer: Medicaid Other | Attending: Obstetrics & Gynecology | Admitting: Obstetrics & Gynecology

## 2019-08-24 DIAGNOSIS — Z3A10 10 weeks gestation of pregnancy: Secondary | ICD-10-CM | POA: Diagnosis not present

## 2019-08-24 DIAGNOSIS — O26891 Other specified pregnancy related conditions, first trimester: Secondary | ICD-10-CM | POA: Insufficient documentation

## 2019-08-24 DIAGNOSIS — Z6790 Unspecified blood type, Rh positive: Secondary | ICD-10-CM

## 2019-08-24 DIAGNOSIS — O3680X0 Pregnancy with inconclusive fetal viability, not applicable or unspecified: Secondary | ICD-10-CM

## 2019-08-24 LAB — HCG, QUANTITATIVE, PREGNANCY: hCG, Beta Chain, Quant, S: 4462 m[IU]/mL — ABNORMAL HIGH (ref <5)

## 2019-08-24 NOTE — MAU Note (Signed)
Christina Fox is a 39 y.o. at [redacted]w[redacted]d here in MAU reporting: here for follow up hcg. Denies pain and bleeding.  Pain score: 0/10  Vitals:   08/24/19 1336  BP: 120/79  Pulse: (!) 120  Resp: 16  Temp: 98.5 F (36.9 C)  SpO2: 100%     Lab orders placed from triage: hcg

## 2019-08-24 NOTE — MAU Provider Note (Signed)
Subjective:  Christina Fox is a 39 y.o. G5P4004 at [redacted]w[redacted]d who presents today for FU BHCG. She was seen on 08/18/2019, 08/20/2019, 08/22/2019, 08/23/2019. Results from that day show gestational sac with no definitive IUP on [place], and HCG 3,002/2995/3094 respectively. She denies vaginal bleeding. She denies abdominal or pelvic pain.   Objective:  Physical Exam  Nursing note and vitals reviewed. Patient Vitals for the past 24 hrs:  BP Temp Temp src Pulse Resp SpO2  08/24/19 1336 120/79 98.5 F (36.9 C) Oral (!) 120 16 100 %   Constitutional: She is oriented to person, place, and time. She appears well-developed and well-nourished. No distress.  HENT:  Head: Normocephalic.  Cardiovascular: Normal rate.  Respiratory: Effort normal.  GI: Soft. There is no tenderness.  Neurological: She is alert and oriented to person, place, and time. Skin: Skin is warm and dry.  Psychiatric: She has a normal mood and affect.   Results for orders placed or performed during the hospital encounter of 08/24/19 (from the past 24 hour(s))  hCG, quantitative, pregnancy     Status: Abnormal   Collection Time: 08/24/19  1:46 PM  Result Value Ref Range   hCG, Beta Chain, Quant, S 4,462 (H) <5 mIU/mL    Assessment/Plan: Pregnancy of unknown location HCG did  rise appropriately FU in 10 days for: repeat US per consultation with Dr. Roselie Awkward Strict ectopic/pain/bleeding/return MAU precautions given Pt discharged to home in stable condition  Anaya Bovee, Gerrie Nordmann, NP  4:53 PM 08/24/2019

## 2019-08-24 NOTE — Discharge Instructions (Signed)
Blighted Ovum  A blighted ovum is a common kind of early pregnancy failure and a frequent cause of early pregnancy loss. It is often called a miscarriage. It happens when a fertilized egg attaches to the wall of the uterus but stops growing. Even though the egg never develops, the body acts like it is pregnant. An early miscarriage can be difficult as you will experience the physical symptoms of the loss. You may also experience strong emotional symptoms that accompany the loss of a pregnancy. What are the causes? This condition is usually caused by a problem with the genes (genetic defect) in the egg. What are the signs or symptoms? Early symptoms of this condition are the same as those of a normal early pregnancy. They include:  A missed menstrual period.  Fatigue.  Feeling sick to your stomach (nauseous).  Sore breasts. Later symptoms are those of pregnancy loss. They include:  Abdominal cramps.  Vaginal bleeding or spotting.  A menstrual period that is heavier than usual. How is this diagnosed? This condition is usually diagnosed by a routine ultrasound. It can be confirmed with blood tests. How is this treated? This condition may be treated by:  Waiting until your body naturally gets rid of the empty egg sac and placenta (miscarriage).  Taking medicine to start a miscarriage. This medicine can be taken by mouth or placed into the vagina.  Having a surgical procedure to remove the tissue. Your health care provider would open the cervix (dilation) and remove the tissue from your uterus (curettage). Follow these instructions at home:  Take over-the-counter and prescription medicines only as told by your health care provider.  Talk with your health care provider about future pregnancies and pregnancy planning. Having this condition does not mean you will lose future pregnancies.  To lower your risk of an infection, avoid the use of tampons or douches until your bleeding stops.  Also, avoid intercourse until your bleeding stops.  You can return to your normal activities as soon as you feel well enough. Talk with your health care provider before resuming strenuous physical activity.  After your miscarriage: ? Rest at home for a few days. ? You may have menstrual-like bleeding for a week or more, and you may have light bleeding for a couple weeks after that. Wear a pad until vaginal bleeding stops.  Keep all follow-up visits as told by your health care provider. This is important. Contact a health care provider if:  You have a fever or chills.  Your pain medicine is not helping.  You have vaginal bleeding that continues for longer than expected.  You continue to experience sadness after the loss of your pregnancy. Get help right away if:  You have severe abdominal pain.  You feel dizzy or faint.  You pass out.  You have very heavy vaginal bleeding. A sign that vaginal bleeding is very heavy is if blood soaks through two large sanitary pads an hour for more than two hours. Summary  A blighted ovum is a common kind of early pregnancy loss also known as a miscarriage.  Talk with your health care provider about treatment options that are best for you when experiencing a miscarriage.  After your miscarriage, talk with your health care provider about future pregnancies or family planning needs.  Follow your health care provider's instructions for recovery after your miscarriage. This information is not intended to replace advice given to you by your health care provider. Make sure you discuss any questions  you have with your health care provider. Document Revised: 11/01/2017 Document Reviewed: 11/01/2017 Elsevier Patient Education  [place] A miscarriage is the loss of an unborn baby (fetus) before the 20th week of pregnancy. Most miscarriages happen during the first 3 months of pregnancy. Sometimes, a miscarriage can happen before a  woman knows that she is pregnant. Having a miscarriage can be an emotional experience. If you have had a miscarriage, talk with your health care provider about any questions you may have about miscarrying, the grieving process, and your plans for future pregnancy. What are the causes? A miscarriage may be caused by:  Problems with the genes or chromosomes of the fetus. These problems make it impossible for the baby to develop normally. They are often the result of random errors that occur early in the development of the baby, and are not passed from parent to child (not inherited).  Infection of the cervix or uterus.  Conditions that affect hormone balance in the body.  Problems with the cervix, such as the cervix opening and thinning before pregnancy is at term (cervical insufficiency).  Problems with the uterus. These may include: ? A uterus with an abnormal shape. ? Fibroids in the uterus. ? Congenital abnormalities. These are problems that were present at birth.  Certain medical conditions.  Smoking, drinking alcohol, or using drugs.  Injury (trauma). In many cases, the cause of a miscarriage is not known. What are the signs or symptoms? Symptoms of this condition include:  Vaginal bleeding or spotting, with or without cramps or pain.  Pain or cramping in the abdomen or lower back.  Passing fluid, tissue, or blood clots from the vagina. How is this diagnosed? This condition may be diagnosed based on:  A physical exam.  Ultrasound.  Blood tests.  Urine tests. How is this treated? Treatment for a miscarriage is sometimes not necessary if you naturally pass all the tissue that was in your uterus. If necessary, this condition may be treated with:  Dilation and curettage (D&C). This is a procedure in which the cervix is stretched open and the lining of the uterus (endometrium) is scraped. This is done only if tissue from the fetus or placenta remains in the body  (incomplete miscarriage).  Medicines, such as: ? Antibiotic medicine, to treat infection. ? Medicine to help the body pass any remaining tissue. ? Medicine to reduce (contract) the size of the uterus. These medicines may be given if you have a lot of bleeding. If you have Rh negative blood and your baby was Rh positive, you will need a shot of a medicine called Rh immunoglobulinto protect your future babies from Rh blood problems. "Rh-negative" and "Rh-positive" refer to whether or not the blood has a specific protein found on the surface of red blood cells (Rh factor). Follow these instructions at home: Medicines   Take over-the-counter and prescription medicines only as told by your health care provider.  If you were prescribed antibiotic medicine, take it as told by your health care provider. Do not stop taking the antibiotic even if you start to feel better.  Do not take NSAIDs, such as aspirin and ibuprofen, unless they are approved by your health care provider. These medicines can cause bleeding. Activity  Rest as directed. Ask your health care provider what activities are safe for you.  Have someone help with home and family responsibilities during this time. General instructions  Keep track of the number of sanitary pads you use each  day and how soaked (saturated) they are. Write down this information.  Monitor the amount of tissue or blood clots that you pass from your vagina. Save any large amounts of tissue for your health care provider to examine.  Do not use tampons, douche, or have sex until your health care provider approves.  To help you and your partner with the process of grieving, talk with your health care provider or seek counseling.  When you are ready, meet with your health care provider to discuss any important steps you should take for your health. Also, discuss steps you should take to have a healthy pregnancy in the future.  Keep all follow-up visits as  told by your health care provider. This is important. Where to find more information  The American Congress of Obstetricians and Gynecologists: www.acog.org  [place] Department of Health and [place], systems of Womens Health: [place] Contact a health care provider if:  You have a fever or chills.  You have a foul smelling vaginal discharge.  You have more bleeding instead of less. Get help right away if:  You have severe cramps or pain in your back or abdomen.  You pass blood clots or tissue from your vagina that is walnut-sized or larger.  You soak more than 1 regular sanitary pad in an hour.  You become light-headed or weak.  You pass out.  You have feelings of sadness that take over your thoughts, or you have thoughts of hurting yourself. Summary  Most miscarriages happen in the first 3 months of pregnancy. Sometimes miscarriage happens before a woman even knows that she is pregnant.  Follow your health care provider's instruction for home care. Keep all follow-up appointments.  To help you and your partner with the process of grieving, talk with your health care provider or seek counseling. This information is not intended to replace advice given to you by your health care provider. Make sure you discuss any questions you have with your health care provider. Document Revised: 10/05/2018 Document Reviewed: 07/19/2016 Elsevier Patient Education  [place]. Ectopic Pregnancy  An ectopic pregnancy is when the fertilized egg attaches (implants) outside the uterus. Most ectopic pregnancies occur in one of the tubes where eggs travel from the ovary to the uterus (fallopian tubes), but the implanting can occur in other locations. In rare cases, ectopic pregnancies occur on the ovary, intestine, pelvis, abdomen, or cervix. In an ectopic pregnancy, the fertilized egg does not have the ability to develop into a normal, healthy baby. A ruptured ectopic  pregnancy is one in which tearing or bursting of a fallopian tube causes internal bleeding. Often, there is intense lower abdominal pain, and vaginal bleeding sometimes occurs. Having an ectopic pregnancy can be life-threatening. If this dangerous condition is not treated, it can lead to blood loss, shock, or even death. What are the causes? The most common cause of this condition is damage to one of the fallopian tubes. A fallopian tube may be narrowed or blocked, and that keeps the fertilized egg from reaching the uterus. What increases the risk? This condition is more likely to develop in women of childbearing age who have different levels of risk. The levels of risk can be divided into three categories. High risk  You have gone through infertility treatment.  You have had an ectopic pregnancy before.  You have had surgery on the fallopian tubes, or another surgical procedure, such as an abortion.  You have had surgery to have the fallopian  tubes tied (tubal ligation).  You have problems or diseases of the fallopian tubes.  You have been exposed to diethylstilbestrol (DES). This medicine was used until 1971, and it had effects on babies whose mothers took the medicine.  You become pregnant while using an IUD (intrauterine device) for birth control. Moderate risk  You have a history of infertility.  You have had an STI (sexually transmitted infection).  You have a history of pelvic inflammatory disease (PID).  You have scarring from endometriosis.  You have multiple sexual partners.  You smoke. Low risk  You have had pelvic surgery.  You use vaginal douches.  You became sexually active before age 31. What are the signs or symptoms? Common symptoms of this condition include normal pregnancy symptoms, such as missing a period, nausea, tiredness, abdominal pain, breast tenderness, and bleeding. However, ectopic pregnancy will have additional symptoms, such as:  Pain with  intercourse.  Irregular vaginal bleeding or spotting.  Cramping or pain on one side or in the lower abdomen.  Fast heartbeat, low blood pressure, and sweating.  Passing out while having a bowel movement. Symptoms of a ruptured ectopic pregnancy and internal bleeding may include:  Sudden, severe pain in the abdomen and pelvis.  Dizziness, weakness, light-headedness, or fainting.  Pain in the shoulder or neck area. How is this diagnosed? This condition is diagnosed by:  A pelvic exam to locate pain or a mass in the abdomen.  A pregnancy test. This blood test checks for the presence as well as the specific level of pregnancy hormone in the bloodstream.  Ultrasound. This is performed if a pregnancy test is positive. In this test, a probe is inserted into the vagina. The probe will detect a fetus, possibly in a location other than the uterus.  Taking a sample of uterus tissue (dilation and curettage, or D&C).  Surgery to perform a visual exam of the inside of the abdomen using a thin, lighted tube that has a tiny camera on the end (laparoscope).  Culdocentesis. This procedure involves inserting a needle at the top of the vagina, behind the uterus. If blood is present in this area, it may indicate that a fallopian tube is torn. How is this treated? This condition is treated with medicine or surgery. Medicine  An injection of a medicine (methotrexate) may be given to cause the pregnancy tissue to be absorbed. This medicine may save your fallopian tube. It may be given if: ? The diagnosis is made early, with no signs of active bleeding. ? The fallopian tube has not ruptured. ? You are considered to be a good candidate for the medicine. Usually, pregnancy hormone blood levels are checked after methotrexate treatment. This is to be sure that the medicine is effective. It may take 4-6 weeks for the pregnancy to be absorbed. Most pregnancies will be absorbed by 3 weeks. Surgery  A  laparoscope may be used to remove the pregnancy tissue.  If severe internal bleeding occurs, a larger cut (incision) may be made in the lower abdomen (laparotomy) to remove the fetus and placenta. This is done to stop the bleeding.  Part or all of the fallopian tube may be removed (salpingectomy) along with the fetus and placenta. The fallopian tube may also be repaired during the surgery.  In very rare circumstances, removal of the uterus (hysterectomy) may be required.  After surgery, pregnancy hormone testing may be done to be sure that there is no pregnancy tissue left. Whether your treatment is medicine  or surgery, you may receive a Rho (D) immune globulin shot to prevent problems with any future pregnancy. This shot may be given if:  You are Rh-negative and the baby's father is Rh-positive.  You are Rh-negative and you do not know the Rh type of the baby's father. Follow these instructions at home:  Rest and limit your activity after the procedure for as long as told by your health care provider.  Until your health care provider says that it is safe: ? Do not lift anything that is heavier than 10 lb (4.5 kg), or the limit that your health care provider tells you. ? Avoid physical exercise and any movement that requires effort (is strenuous).  To help prevent constipation: ? Eat a healthy diet that includes fruits, vegetables, and whole grains. ? Drink 6-8 glasses of water per day. Get help right away if:  You develop worsening pain that is not relieved by medicine.  You have: ? A fever or chills. ? Vaginal bleeding. ? Redness and swelling at the incision site. ? Nausea and vomiting.  You feel dizzy or weak.  You feel light-headed or you faint. This information is not intended to replace advice given to you by your health care provider. Make sure you discuss any questions you have with your health care provider. Document Revised: 05/26/2017 Document Reviewed:  01/13/2016 Elsevier Patient Education  [place].

## 2019-08-28 ENCOUNTER — Inpatient Hospital Stay (HOSPITAL_COMMUNITY)
Admission: AD | Admit: 2019-08-28 | Discharge: 2019-08-28 | Disposition: A | Discharge status: Home / Self Care | Payer: Medicaid Other | Attending: Obstetrics and Gynecology | Admitting: Obstetrics and Gynecology

## 2019-08-28 ENCOUNTER — Encounter (HOSPITAL_COMMUNITY): Payer: Self-pay | Admitting: Obstetrics and Gynecology

## 2019-08-28 ENCOUNTER — Inpatient Hospital Stay (HOSPITAL_COMMUNITY): Payer: Medicaid Other

## 2019-08-28 ENCOUNTER — Other Ambulatory Visit: Payer: Self-pay

## 2019-08-28 DIAGNOSIS — O09522 Supervision of elderly multigravida, second trimester: Secondary | ICD-10-CM | POA: Diagnosis not present

## 2019-08-28 DIAGNOSIS — Z3A11 11 weeks gestation of pregnancy: Secondary | ICD-10-CM | POA: Diagnosis not present

## 2019-08-28 DIAGNOSIS — O200 Threatened abortion: Secondary | ICD-10-CM | POA: Diagnosis not present

## 2019-08-28 DIAGNOSIS — O209 Hemorrhage in early pregnancy, unspecified: Secondary | ICD-10-CM | POA: Diagnosis present

## 2019-08-28 DIAGNOSIS — O26899 Other specified pregnancy related conditions, unspecified trimester: Secondary | ICD-10-CM

## 2019-08-28 DIAGNOSIS — Z87891 Personal history of nicotine dependence: Secondary | ICD-10-CM | POA: Diagnosis not present

## 2019-08-28 LAB — URINALYSIS, ROUTINE W REFLEX MICROSCOPIC
Bilirubin Urine: NEGATIVE
Glucose, UA: NEGATIVE mg/dL
Ketones, ur: NEGATIVE mg/dL
Nitrite: NEGATIVE
Protein, ur: 30 mg/dL — AB
RBC / HPF: >50 RBC/hpf — ABNORMAL HIGH (ref 0–5)
Specific Gravity, Urine: 1.009 (ref 1.005–1.030)
pH: 7 (ref 5.0–8.0)

## 2019-08-28 NOTE — MAU Note (Signed)
I have been seeing pink on tissue when I wipe. Tonight is getting red when I wipe and having some cramping

## 2019-08-28 NOTE — Discharge Instructions (Signed)
Threatened Miscarriage  A threatened miscarriage occurs when a woman has vaginal bleeding during the first 20 weeks of pregnancy but the pregnancy has not ended. If you have vaginal bleeding during this time, your health care provider will do tests to make sure you are still pregnant. If the tests show that you are still pregnant and that the developing baby (fetus) inside your uterus is still growing, your condition is considered a threatened miscarriage. A threatened miscarriage does not mean your pregnancy will end, but it does increase the risk of losing your pregnancy (complete miscarriage). What are the causes? The cause of this condition is usually not known. For women who go on to have a complete miscarriage, the most common cause is an abnormal number of chromosomes in the developing baby. Chromosomes are the structures inside cells that hold all of a person's genetic material. What increases the risk? The following lifestyle factors may increase your risk of a miscarriage in early pregnancy:  Smoking.  Drinking excessive amounts of alcohol or caffeine.  Recreational drug use. The following preexisting health conditions may increase your risk of a miscarriage in early pregnancy:  Polycystic ovary syndrome.  Uterine fibroids.  Infections.  Diabetes mellitus. What are the signs or symptoms? Symptoms of this condition include:  Vaginal bleeding.  Mild abdominal pain or cramps. How is this diagnosed? If you have bleeding with or without abdominal pain before 20 weeks of pregnancy, your health care provider will do tests to check whether you are still pregnant. These will include:  Ultrasound. This test uses sound waves to create images of the inside of your uterus. This allows your health care provider to look at your developing baby and other structures, such as your placenta.  Pelvic exam. This is an internal exam of your vagina and cervix.  Measurement of your baby's heart  rate.  Laboratory tests such as blood tests, urine tests, or swabs for infection You may be diagnosed with a threatened miscarriage if:  Ultrasound testing shows that you are still pregnant.  Your baby's heart rate is strong.  A pelvic exam shows that the opening between your uterus and your vagina (cervix) is closed.  Blood tests confirm that you are still pregnant. How is this treated? No treatments have been shown to prevent a threatened miscarriage from going on to a complete miscarriage. However, the right home care is important. Follow these instructions at home:  Get plenty of rest.  Do not have sex or use tampons if you have vaginal bleeding.  Do not douche.  Do not smoke or use recreational drugs.  Do not drink alcohol.  Avoid caffeine.  Keep all follow-up prenatal visits as told by your health care provider. This is important. Contact a health care provider if:  You have light vaginal bleeding or spotting while pregnant.  You have abdominal pain or cramping.  You have a fever. Get help right away if:  You have heavy vaginal bleeding.  You have blood clots coming from your vagina.  You pass tissue from your vagina.  You leak fluid, or you have a gush of fluid from your vagina.  You have severe low back pain or abdominal cramps.  You have fever, chills, and severe abdominal pain. Summary  A threatened miscarriage occurs when a woman has vaginal bleeding during the first 20 weeks of pregnancy but the pregnancy has not ended.  The cause of a threatened miscarriage is usually not known.  Symptoms of this condition may   include vaginal bleeding and mild abdominal pain or cramps.  No treatments have been shown to prevent a threatened miscarriage from going on to a complete miscarriage.  Keep all follow-up prenatal visits as told by your health care provider. This is important. This information is not intended to replace advice given to you by your health  care provider. Make sure you discuss any questions you have with your health care provider. Document Revised: 07/20/2017 Document Reviewed: 09/09/2016 Elsevier Patient Education  2020 [employer]  

## 2019-08-28 NOTE — MAU Provider Note (Signed)
Chief Complaint: Vaginal Bleeding and Abdominal Pain   First Provider Initiated Contact with Patient 08/28/19 2032        SUBJECTIVE HPI: Christina Fox is a 39 y.o. [id number] at [redacted]w[redacted]d by LMP who presents to maternity admissions reporting cramping and pink bleeding.  States it is getting more red. . She denies vaginal itching/burning, urinary symptoms, h/a, dizziness, n/v, or fever/chills.    Has been followed for this over the past few weeks. HCGs have been rising inappropriately 08/20/19   =   2995 08/22/19   =   3094 08/24/19   =   4462   IUGS seen, No YS or Embryo (MSD [redacted]w[redacted]d)  Vaginal Bleeding The patient's primary symptoms include pelvic pain and vaginal bleeding. The patient's pertinent negatives include no genital itching, genital lesions or genital odor. The problem occurs constantly. Associated symptoms include abdominal pain. Pertinent negatives include no chills, constipation, diarrhea, fever, nausea or vomiting. The vaginal discharge was bloody. The vaginal bleeding is spotting. Nothing aggravates the symptoms. She has tried nothing for the symptoms.  Abdominal Pain This is a new problem. The current episode started yesterday. Pertinent negatives include no constipation, diarrhea, fever, nausea or vomiting.    RN Note: I have been seeing pink on tissue when I wipe. Tonight is getting red when I wipe and having some cramping  Past Medical History:  Diagnosis Date  . Abdominal pain    "right lower quadrant pain and right lower back pain  . Anxiety   . Deviated septum 09/2011  . GERD (gastroesophageal reflux disease)   . Headache(784.0)    sinus  . History of echocardiogram    Echo 11/17: EF 55-60, no RWMA, normal diastolic function  . Hypertension    no meds  . Kidney stones    no current problem  . Migraine   . Nasal turbinate hypertrophy 09/2011   bilat.  . Pelvic inflammatory disease   . Urticaria    Past Surgical History:  Procedure Laterality Date  .  CHOLECYSTECTOMY N/A 01/15/2016   Procedure: LAPAROSCOPIC CHOLECYSTECTOMY WITH INTRAOPERATIVE CHOLANGIOGRAM;  Surgeon: Christene Lye, MD;  Location: [facility];  Service: General;  Laterality: N/A;  . COLONOSCOPY  2007   [facility]: pedunculated 55mm polyp in distal sigmoid, sessile 77mm polyp at splenic flexure, larger polyp tubulovillous adenoma and smaller polyp tubular adenoma   . COLONOSCOPY  09-09-15  . COLONOSCOPY WITH PROPOFOL N/A 03/01/2016   Procedure: COLONOSCOPY WITH PROPOFOL;  Surgeon: Garlan Fair, MD;  Location: [facility];  Service: Endoscopy;  Laterality: N/A;  . NASAL SEPTOPLASTY W/ TURBINOPLASTY  10/04/2011   Procedure: NASAL SEPTOPLASTY WITH TURBINATE REDUCTION;  Surgeon: Ascencion Dike, MD;  Location: [place];  Service: ENT;  Laterality: Bilateral;  . WISDOM TOOTH EXTRACTION  2009   Social History   Socioeconomic History  . Marital status: Single    Spouse name: Not on file  . Number of children: 4  . Years of education: 106  . Highest education level: Not on file  Occupational History  . 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 Smoking status: Former Smoker    Packs/day: 0.25    Years: 10.00    Pack years: 2.50    Quit date: 08/23/2008    Years since quitting: 11.0  . Smokeless tobacco: Never Used  Substance and Sexual Activity  . Alcohol use: Yes    Alcohol/week: 0.0 standard drinks    Comment: ocassionally  .  Drug use: No  . Sexual activity: Yes    Partners: Male    Birth control/protection: None  Other Topics Concern  . Not on file  Social History Narrative   Lives at home w/ her children   Right-handed   Drinks occasional sweet tea   Social Determinants of Health   Financial Resource Strain:   . Difficulty of Paying Living Expenses: Not on file  Food Insecurity:   . Worried About Charity fundraiser in the Last Year: Not on file  . Ran Out of Food in the Last Year: Not on file  Transportation Needs:   .  Lack of Transportation (Medical): Not on file  . Lack of Transportation (Non-Medical): Not on file  Physical Activity:   . Days of Exercise per Week: Not on file  . Minutes of Exercise per Session: Not on file  Stress:   . Feeling of Stress : Not on file  Social Connections:   . Frequency of Communication with Friends and Family: Not on file  . Frequency of Social Gatherings with Friends and Family: Not on file  . Attends Religious Services: Not on file  . Active Member of Clubs or Organizations: Not on file  . Attends Archivist Meetings: Not on file  . Marital Status: Not on file  Intimate Partner Violence:   . Fear of Current or Ex-Partner: Not on file  . Emotionally Abused: Not on file  . Physically Abused: Not on file  . Sexually Abused: Not on file   No current facility-administered medications on file prior to encounter.   Current Outpatient Medications on File Prior to Encounter  Medication Sig Dispense Refill  . clonazePAM (KLONOPIN) 0.5 MG tablet Take 0.25 mg by mouth daily.     Marland Kitchen FLUoxetine HCl (PROZAC PO) Take by mouth.    . loratadine (CLARITIN) 10 MG tablet Take 10 mg by mouth daily.    Marland Kitchen omeprazole (PRILOSEC) 40 MG capsule Take by mouth.    . Prenat-Fe Poly-Methfol-FA-DHA (VITAFOL ULTRA) 29-0.6-0.4-200 MG CAPS Take 1 capsule by mouth daily before breakfast. 30 capsule 11  . Vitamin D, Ergocalciferol, (DRISDOL) 1.25 MG (50000 UNIT) CAPS capsule Take 50,000 Units by mouth every 7 (seven) days.    . [DISCONTINUED] escitalopram (LEXAPRO) 10 MG tablet Take 10 mg by mouth daily.       Allergies  Allergen Reactions  . Clarithromycin Hives and Itching  . Doxycycline Shortness Of Breath    Chest pain  . Bentyl [Dicyclomine Hcl] Other (See Comments)    DIZZINESS  . Haldol [Haloperidol] Other (See Comments)    Made her feel very jittery and agitated  . Meclizine     Chest pain  . Morphine And Related Other (See Comments)    Made her feel like she was losing  her mind  . Toradol [Ketorolac Tromethamine] Other (See Comments)    Made her feel very jittery and agitated  . Macrobid [Nitrofurantoin Monohyd Macro] Rash  . Meclizine Hcl Rash  . Nitrofurantoin Rash  . Sulfa Antibiotics Hives and Rash    I have reviewed patient's Past Medical Hx, Surgical Hx, Family Hx, Social Hx, medications and allergies.   ROS:  Review of Systems  Constitutional: Negative for chills and fever.  Gastrointestinal: Positive for abdominal pain. Negative for constipation, diarrhea, nausea and vomiting.  Genitourinary: Positive for pelvic pain and vaginal bleeding.   Review of Systems  Other systems negative   Physical Exam  Physical Exam Patient Vitals  for the past 24 hrs:  BP Temp Pulse Height Weight  08/28/19 2022 -- 98.5 F (36.9 C) -- -- --  08/28/19 2021 137/86 -- 91 -- --  08/28/19 2020 -- -- -- 5\' 3"  (1.6 m) 98.9 kg   Constitutional: Well-developed, well-nourished female in no acute distress.  Cardiovascular: normal rate Respiratory: normal effort GI: Abd soft, non-tender. Pos BS x 4 MS: Extremities nontender, no edema, normal ROM Neurologic: Alert and oriented x 4.  GU: Neg CVAT.  PELVIC EXAM: Light red vaginal discharge, small amount  LAB RESULTS Results for orders placed or performed during the hospital encounter of 08/28/19 (from the past 24 hour(s))  Urinalysis, Routine w reflex microscopic     Status: Abnormal   Collection Time: 08/28/19  8:23 PM  Result Value Ref Range   Color, Urine YELLOW YELLOW   APPearance CLOUDY (A) CLEAR   Specific Gravity, Urine 1.009 1.005 - 1.030   pH 7.0 5.0 - 8.0   Glucose, UA NEGATIVE NEGATIVE mg/dL   Hgb urine dipstick LARGE (A) NEGATIVE   Bilirubin Urine NEGATIVE NEGATIVE   Ketones, ur NEGATIVE NEGATIVE mg/dL   Protein, ur 30 (A) NEGATIVE mg/dL   Nitrite NEGATIVE NEGATIVE   Leukocytes,Ua LARGE (A) NEGATIVE   RBC / HPF >50 (H) 0 - 5 RBC/hpf   WBC, UA 21-50 0 - 5 WBC/hpf   Bacteria, UA FEW (A) NONE  SEEN   Squamous Epithelial / LPF 21-50 0 - 5   Mucus PRESENT     IMAGING US OB Transvaginal  Result Date: 08/28/2019 CLINICAL DATA:  Cramping EXAM: OBSTETRIC <14 WK [place] TECHNIQUE: Trans abdominal ultrasound examination was performed for complete evaluation of the gestation as well as the maternal uterus, adnexal regions, and pelvic cul-de-sac. COMPARISON:  08/23/2019 FINDINGS: Intrauterine gestational sac: Single Yolk sac:  Not Visualized. Embryo:  Not Visualized. Cardiac Activity: Not Visualized. MSD: 7.6 mm   5 w   3 d Subchorionic hemorrhage:  None visualized. Maternal uterus/adnexae: A corpus luteal cyst is noted involving the right ovary. The left ovary is unremarkable. There is no significant free fluid. IMPRESSION: Again noted is a single IUP as detailed above. Given an embryo is not yet visualized, findings are suspicious for non-viability. Findings are suspicious but not yet definitive for failed pregnancy. Recommend follow-up US in 10-14 days for definitive diagnosis. This recommendation follows SRU consensus guidelines: Diagnostic Criteria for Nonviable Pregnancy Early in the First Trimester. [place] Med [id number]. Electronically Signed   By: Constance Holster M.D.   On: 08/28/2019 21:47   MAU Management/MDM: Ordered repeat Ultrasound to evaluate for possible change Mean sac diameter went from 6.45mm to 7.62mm US showed GS which is bigger but still no embryo or yolk sac Discussed this is suspicious for failed pregnancy but Radiologist does not consider it confirmatory yet Reviewed signs of SAB and what to come in for. This bleeding/pain can represent a normal pregnancy with bleeding, spontaneous abortion or even an ectopic which can be life-threatening.  The process as listed above helps to determine which of these is present.  Will have her keep [place] appt for next week  ASSESSMENT Pregnancy at [redacted]w[redacted]d by LMP Threatened abortion  PLAN Discharge home Plan repeat  Ultrasound next  week  Ectopic precautions SAB precautions, reviewed she will likely continue to have bleeding and cramping.  If this progresses to SAB she will experience heavier bleeding with clots and stronger cramps.  She can come back if they become worrisome.  Pt stable at time of discharge. Encouraged to return here or to other Urgent Care/ED if she develops worsening of symptoms, increase in pain, fever, or other concerning symptoms.    Hansel Feinstein CNM, MSN Certified Nurse-Midwife 08/28/2019  8:33 PM  As RN was discharging her she stated she has made an appt for next week in [facility] because she doesn't feel we have given her enough information as to what to return for. She also doesn't think we should make her wait so long for followup.  I explained to her earlier that it takes time for pregnancy components to develop.  RN asked if she wanted to talk to me further and she declined.   I will leave her appts with [place] intact in case she decides to keep them  Seabron Spates, CNM

## 2019-09-03 ENCOUNTER — Ambulatory Visit (HOSPITAL_COMMUNITY): Admission: RE | Admit: 2019-09-03 | Payer: Medicaid Other | Source: Ambulatory Visit

## 2019-09-03 ENCOUNTER — Telehealth (INDEPENDENT_AMBULATORY_CARE_PROVIDER_SITE_OTHER): Payer: Medicaid Other | Admitting: Obstetrics & Gynecology

## 2019-09-03 DIAGNOSIS — O3680X0 Pregnancy with inconclusive fetal viability, not applicable or unspecified: Secondary | ICD-10-CM

## 2019-09-03 NOTE — Progress Notes (Signed)
   Patient was not available today for her scheduled virtual appointment. Also did not keep appointment for ultrasound that was supposed to be reviewed during this visit.    Verita Schneiders, MD, [place] for [employer], [place]

## 2019-09-30 ENCOUNTER — Emergency Department (HOSPITAL_COMMUNITY)
Admission: EM | Admit: 2019-09-30 | Discharge: 2019-09-30 | Disposition: A | Discharge status: Home / Self Care | Payer: Medicaid Other | Attending: Emergency Medicine | Admitting: Emergency Medicine

## 2019-09-30 ENCOUNTER — Emergency Department (HOSPITAL_COMMUNITY): Payer: Medicaid Other

## 2019-09-30 ENCOUNTER — Encounter (HOSPITAL_COMMUNITY): Payer: Self-pay | Admitting: Emergency Medicine

## 2019-09-30 ENCOUNTER — Other Ambulatory Visit: Payer: Self-pay

## 2019-09-30 DIAGNOSIS — R55 Syncope and collapse: Secondary | ICD-10-CM | POA: Diagnosis not present

## 2019-09-30 DIAGNOSIS — R0602 Shortness of breath: Secondary | ICD-10-CM | POA: Diagnosis not present

## 2019-09-30 DIAGNOSIS — I10 Essential (primary) hypertension: Secondary | ICD-10-CM | POA: Insufficient documentation

## 2019-09-30 DIAGNOSIS — Z79899 Other long term (current) drug therapy: Secondary | ICD-10-CM | POA: Diagnosis not present

## 2019-09-30 DIAGNOSIS — R42 Dizziness and giddiness: Secondary | ICD-10-CM | POA: Diagnosis present

## 2019-09-30 DIAGNOSIS — Z87891 Personal history of nicotine dependence: Secondary | ICD-10-CM | POA: Diagnosis not present

## 2019-09-30 LAB — CBC WITH DIFFERENTIAL/PLATELET
Abs Immature Granulocytes: 0.02 10*3/uL (ref 0.00–0.07)
Basophils Absolute: 0.1 10*3/uL (ref 0.0–0.1)
Basophils Relative: 1 %
Eosinophils Absolute: 0.5 10*3/uL (ref 0.0–0.5)
Eosinophils Relative: 6 %
HCT: 39.7 % (ref 36.0–46.0)
Hemoglobin: 12.8 g/dL (ref 12.0–15.0)
Immature Granulocytes: 0 %
Lymphocytes Relative: 19 %
Lymphs Abs: 1.6 10*3/uL (ref 0.7–4.0)
MCH: 29 pg (ref 26.0–34.0)
MCHC: 32.2 g/dL (ref 30.0–36.0)
MCV: 89.8 fL (ref 80.0–100.0)
Monocytes Absolute: 0.7 10*3/uL (ref 0.1–1.0)
Monocytes Relative: 9 %
Neutro Abs: 5.2 10*3/uL (ref 1.7–7.7)
Neutrophils Relative %: 65 %
Platelets: 310 10*3/uL (ref 150–400)
RBC: 4.42 MIL/uL (ref 3.87–5.11)
RDW: 13.2 % (ref 11.5–15.5)
WBC: 8 10*3/uL (ref 4.0–10.5)
nRBC: 0 % (ref 0.0–0.2)

## 2019-09-30 LAB — COMPREHENSIVE METABOLIC PANEL
ALT: 61 U/L — ABNORMAL HIGH (ref 0–44)
AST: 32 U/L (ref 15–41)
Albumin: 3.7 g/dL (ref 3.5–5.0)
Alkaline Phosphatase: 69 U/L (ref 38–126)
Anion gap: 10 (ref 5–15)
BUN: 10 mg/dL (ref 6–20)
CO2: 25 mmol/L (ref 22–32)
Calcium: 9 mg/dL (ref 8.9–10.3)
Chloride: 105 mmol/L (ref 98–111)
Creatinine, Ser: 0.8 mg/dL (ref 0.44–1.00)
GFR calc Af Amer: >60 mL/min (ref >60)
GFR calc non Af Amer: >60 mL/min (ref >60)
Glucose, Bld: 121 mg/dL — ABNORMAL HIGH (ref 70–99)
Potassium: 3 mmol/L — ABNORMAL LOW (ref 3.5–5.1)
Sodium: 140 mmol/L (ref 135–145)
Total Bilirubin: 0.8 mg/dL (ref 0.3–1.2)
Total Protein: 7 g/dL (ref 6.5–8.1)

## 2019-09-30 LAB — TROPONIN I (HIGH SENSITIVITY): Troponin I (High Sensitivity): 2 ng/L (ref <18)

## 2019-09-30 MED ORDER — POTASSIUM CHLORIDE CRYS ER 20 MEQ PO TBCR
40.0000 meq | EXTENDED_RELEASE_TABLET | Freq: Once | ORAL | Status: AC
  Administered 2019-09-30: 40 meq via ORAL
  Filled 2019-09-30: qty 2

## 2019-09-30 MED ORDER — SODIUM CHLORIDE 0.9 % IV SOLN
INTRAVENOUS | Status: DC
  Administered 2019-09-30: 10 mL/h via INTRAVENOUS

## 2019-09-30 NOTE — Discharge Instructions (Addendum)
Please return for any problem.  Follow-up with your regular care provider as instructed. °

## 2019-09-30 NOTE — ED Provider Notes (Addendum)
[facility] DEPT Provider Note   CSN: [id number] Arrival date & time: 09/30/19  1239     History Chief Complaint  Patient presents with  . Dizziness  . Near Syncope    Christina Fox is a 39 y.o. female.  39 year old female presents with chest discomfort with associated dizziness which began this morning when she awoke.  States that when she stood up she felt dizzy and lightheaded.  Had a funny sensation throughout her entire body.  Denies any associated diaphoresis or nausea vomiting.  No headaches.  No cough or cold symptoms.  No recent medication usage.  Patient states has been a great deal of stress recently but denies any SI or HI.  Was started on Zoloft several days ago and was taking too much of it initially and has not taken a dose yesterday.  She denies any pelvic pain or severe vaginal bleeding.  No treatment use prior to arrival        Past Medical History:  Diagnosis Date  . Abdominal pain    "right lower quadrant pain and right lower back pain  . Anxiety   . Deviated septum 09/2011  . GERD (gastroesophageal reflux disease)   . Headache(784.0)    sinus  . History of echocardiogram    Echo 11/17: EF 55-60, no RWMA, normal diastolic function  . Hypertension    no meds  . Kidney stones    no current problem  . Migraine   . Nasal turbinate hypertrophy 09/2011   bilat.  . Pelvic inflammatory disease   . Urticaria     Patient Active Problem List   Diagnosis Date Noted  . Gallstone 01/07/2016  . History of colonic polyps 11/27/2015  . Right lower quadrant abdominal pain 11/27/2015  . HTN (hypertension), benign 11/24/2015  . Abdominal pain 11/16/2015  . Headache 10/13/2015  . 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 Anxiety disorder 11/03/2014    Past Surgical History:  Procedure Laterality Date  . CHOLECYSTECTOMY N/A 01/15/2016   Procedure: LAPAROSCOPIC CHOLECYSTECTOMY WITH INTRAOPERATIVE CHOLANGIOGRAM;  Surgeon: Christene Lye,  MD;  Location: [facility];  Service: General;  Laterality: N/A;  . COLONOSCOPY  2007   [facility]: pedunculated 79mm polyp in distal sigmoid, sessile 43mm polyp at splenic flexure, larger polyp tubulovillous adenoma and smaller polyp tubular adenoma   . COLONOSCOPY  09-09-15  . COLONOSCOPY WITH PROPOFOL N/A 03/01/2016   Procedure: COLONOSCOPY WITH PROPOFOL;  Surgeon: Garlan Fair, MD;  Location: [facility];  Service: Endoscopy;  Laterality: N/A;  . NASAL SEPTOPLASTY W/ TURBINOPLASTY  10/04/2011   Procedure: NASAL SEPTOPLASTY WITH TURBINATE REDUCTION;  Surgeon: Ascencion Dike, MD;  Location: [facility];  Service: ENT;  Laterality: Bilateral;  . WISDOM TOOTH EXTRACTION  2009     OB History    Gravida  5   Para  4   Term  4   Preterm      AB      Living  4     SAB      TAB      Ectopic      Multiple      Live Births  4        Obstetric Comments  1st Menstrual Cycle:  10  1st Pregnancy:  71         Family History  Problem Relation Age of Onset  . Kidney cancer Mother   . Hypertension Mother   . Migraines Mother   .  Multiple sclerosis Mother   . Fibromyalgia Mother   . Liver disease Mother        NASH   . Hypertension Father   . Liver disease Father        liver transplant; had fatty liver disease  . Spina bifida Brother   . Migraines Maternal Grandmother   . Hypertension Other   . Colon cancer Paternal Grandfather   . Allergic rhinitis Daughter   . Asthma Daughter   . Allergic rhinitis Son   . Asthma Son   . Angioedema Neg Hx   . Eczema Neg Hx   . Immunodeficiency Neg Hx   . Urticaria Neg Hx     Social History   Tobacco Use  . Smoking status: Former Smoker    Packs/day: 0.25    Years: 10.00    Pack years: 2.50    Quit date: 08/23/2008    Years since quitting: 11.1  . Smokeless tobacco: Never Used  Substance Use Topics  . Alcohol use: Not Currently    Alcohol/week: 0.0 standard drinks    Comment: ocassionally  . Drug use: No     Home Medications Prior to Admission medications   Medication Sig Start Date End Date Taking? Authorizing Provider  amoxicillin (AMOXIL) 875 MG tablet Take 875 mg by mouth 2 (two) times daily.    [provider]  clonazePAM (KLONOPIN) 0.5 MG tablet Take 0.25 mg by mouth daily.     [provider]  FLUoxetine HCl (PROZAC PO) Take by mouth.    [provider]  loratadine (CLARITIN) 10 MG tablet Take 10 mg by mouth daily.    [provider]  omeprazole (PRILOSEC) 40 MG capsule Take by mouth.    [provider]  Prenat-Fe Poly-Methfol-FA-DHA (VITAFOL ULTRA) 29-0.6-0.4-200 MG CAPS Take 1 capsule by mouth daily before breakfast. 11/01/18   Shelly Bombard, MD  Vitamin D, Ergocalciferol, (DRISDOL) 1.25 MG (50000 UNIT) CAPS capsule Take 50,000 Units by mouth every 7 (seven) days.    [provider]  escitalopram (LEXAPRO) 10 MG tablet Take 10 mg by mouth daily.    09/02/11  [provider]    Allergies    Clarithromycin, Doxycycline, Bentyl [dicyclomine hcl], Haldol [haloperidol], Meclizine, Morphine and related, Toradol [ketorolac tromethamine], Macrobid [nitrofurantoin monohyd macro], Meclizine hcl, Nitrofurantoin, and Sulfa antibiotics  Review of Systems   Review of Systems  All other systems reviewed and are negative.   Physical Exam Updated Vital Signs BP 128/85 (BP Location: Left Arm)   Pulse 82   Temp (!) 97.4 F (36.3 C) (Oral)   Resp 18   LMP 06/11/2019   SpO2 96%   Physical Exam Vitals and nursing note reviewed.  Constitutional:      General: She is not in acute distress.    Appearance: Normal appearance. She is well-developed. She is not toxic-appearing.  HENT:     Head: Normocephalic and atraumatic.  Eyes:     General: Lids are normal.     Conjunctiva/sclera: Conjunctivae normal.     Pupils: Pupils are equal, round, and reactive to light.  Neck:     Thyroid: No thyroid mass.     Trachea: No tracheal  deviation.  Cardiovascular:     Rate and Rhythm: Normal rate and regular rhythm.     Heart sounds: Normal heart sounds. No murmur. No gallop.   Pulmonary:     Effort: Pulmonary effort is normal. No respiratory distress.     Breath sounds: Normal  breath sounds. No stridor. No decreased breath sounds, wheezing, rhonchi or rales.  Abdominal:     General: Bowel sounds are normal. There is no distension.     Palpations: Abdomen is soft.     Tenderness: There is no abdominal tenderness. There is no rebound.  Musculoskeletal:        General: No tenderness. Normal range of motion.     Cervical back: Normal range of motion and neck supple.  Skin:    General: Skin is warm and dry.     Findings: No abrasion or rash.  Neurological:     Mental Status: She is alert and oriented to person, place, and time.     GCS: GCS eye subscore is 4. GCS verbal subscore is 5. GCS motor subscore is 6.     Cranial Nerves: No cranial nerve deficit.     Sensory: No sensory deficit.  Psychiatric:        Speech: Speech normal.        Behavior: Behavior normal.     ED Results / Procedures / Treatments   Labs (all labs ordered are listed, but only abnormal results are displayed) Labs Reviewed  CBC WITH DIFFERENTIAL/PLATELET  COMPREHENSIVE METABOLIC PANEL  TROPONIN I (HIGH SENSITIVITY)    EKG None  Radiology No results found.  Procedures Procedures (including critical care time)  Medications Ordered in ED Medications  0.9 %  sodium chloride infusion (has no administration in time range)    ED Course  I have reviewed the triage vital signs and the nursing notes.  Pertinent labs & imaging results that were available during my care of the patient were reviewed by me and considered in my medical decision making (see chart for details).    MDM Rules/Calculators/A&P                      Patient had an EEG which showed no ischemic changes.  Mild hyperkalemia noted on her electrolytes.  Given oral  potassium for this.  She is not orthostatic here.  Patient is anxious here.  Suspect this is a component of her current symptoms.  Patient is not hypoxic.  She is not tachypneic.  Low suspicion for PE.  Cardiac work-up is also negative.  Will discharge home with return precautions Final Clinical Impression(s) / ED Diagnoses Final diagnoses:  SOB (shortness of breath)    Rx / DC Orders ED Discharge Orders    None       Lacretia Leigh, MD 09/30/19 1531    Lacretia Leigh, MD 09/30/19 [phone]

## 2019-09-30 NOTE — ED Triage Notes (Signed)
Pt reports woke up today feeling dizzy esp when stands up and walking. Reports that she has an abnormal pregnancy and waiting to have a miscarriage. Reports started Zoloft last Thursday.

## 2019-10-22 ENCOUNTER — Encounter (HOSPITAL_COMMUNITY): Payer: Self-pay | Admitting: Obstetrics & Gynecology

## 2019-10-22 ENCOUNTER — Other Ambulatory Visit: Payer: Self-pay

## 2019-10-22 ENCOUNTER — Inpatient Hospital Stay (HOSPITAL_COMMUNITY): Payer: Medicaid Other

## 2019-10-22 ENCOUNTER — Inpatient Hospital Stay (HOSPITAL_COMMUNITY)
Admission: AD | Admit: 2019-10-22 | Discharge: 2019-10-22 | Disposition: A | Discharge status: Home / Self Care | Payer: Medicaid Other | Attending: Obstetrics & Gynecology | Admitting: Obstetrics & Gynecology

## 2019-10-22 DIAGNOSIS — O039 Complete or unspecified spontaneous abortion without complication: Secondary | ICD-10-CM

## 2019-10-22 DIAGNOSIS — R109 Unspecified abdominal pain: Secondary | ICD-10-CM | POA: Diagnosis present

## 2019-10-22 DIAGNOSIS — F17210 Nicotine dependence, cigarettes, uncomplicated: Secondary | ICD-10-CM | POA: Diagnosis not present

## 2019-10-22 DIAGNOSIS — Z885 Allergy status to narcotic agent status: Secondary | ICD-10-CM | POA: Diagnosis not present

## 2019-10-22 DIAGNOSIS — Z881 Allergy status to other antibiotic agents status: Secondary | ICD-10-CM | POA: Diagnosis not present

## 2019-10-22 DIAGNOSIS — Z882 Allergy status to sulfonamides status: Secondary | ICD-10-CM | POA: Insufficient documentation

## 2019-10-22 DIAGNOSIS — Z3A19 19 weeks gestation of pregnancy: Secondary | ICD-10-CM | POA: Diagnosis not present

## 2019-10-22 DIAGNOSIS — O26891 Other specified pregnancy related conditions, first trimester: Secondary | ICD-10-CM

## 2019-10-22 LAB — CBC
HCT: 41.7 % (ref 36.0–46.0)
Hemoglobin: 13.6 g/dL (ref 12.0–15.0)
MCH: 28.9 pg (ref 26.0–34.0)
MCHC: 32.6 g/dL (ref 30.0–36.0)
MCV: 88.5 fL (ref 80.0–100.0)
Platelets: 323 10*3/uL (ref 150–400)
RBC: 4.71 MIL/uL (ref 3.87–5.11)
RDW: 12.7 % (ref 11.5–15.5)
WBC: 8.7 10*3/uL (ref 4.0–10.5)
nRBC: 0 % (ref 0.0–0.2)

## 2019-10-22 LAB — URINALYSIS, ROUTINE W REFLEX MICROSCOPIC
Bilirubin Urine: NEGATIVE
Glucose, UA: NEGATIVE mg/dL
Ketones, ur: NEGATIVE mg/dL
Leukocytes,Ua: NEGATIVE
Nitrite: NEGATIVE
Protein, ur: 100 mg/dL — AB
Specific Gravity, Urine: 1.024 (ref 1.005–1.030)
pH: 6 (ref 5.0–8.0)

## 2019-10-22 LAB — HCG, QUANTITATIVE, PREGNANCY: hCG, Beta Chain, Quant, S: <1 m[IU]/mL (ref <5)

## 2019-10-22 NOTE — MAU Provider Note (Signed)
Chief Complaint: Abdominal Pain   First Provider Initiated Contact with Patient 10/22/19 1527     SUBJECTIVE HPI: Christina Fox is a 39 y.o. [id number] at [redacted]w[redacted]d who presents to Maternity Admissions reporting abdominal pain. Symptoms started yesterday. Goes to an OB/gyn in [place] (records in care everywhere) was diagnosed with a missed abortion in March. Initially chose expectant management but was encouraged to try cytotec earlier this month when she still hadn't passed the gestation. Patient states she didn't take the cytotec until last Wednesday. Had some spotting on Wednesday but no heavy bleeding. Started having LLQ pain earlier today and continue to have some spotting. Denies n/v/d, dysuria, vaginal discharge, fever/chills, or flank pain. Last BM was yesterday.   Location: abdomen Quality: sharp Severity: 7/10 on pain scale Duration: 1 day Timing: constant Modifying factors: nothing makes better or worse. Hasn't treated symptoms Associated signs and symptoms: vaginal bleeding (spotting)  Past Medical History:  Diagnosis Date  . Abdominal pain    "right lower quadrant pain and right lower back pain  . Anxiety   . Deviated septum 09/2011  . GERD (gastroesophageal reflux disease)   . Headache(784.0)    sinus  . History of echocardiogram    Echo 11/17: EF 55-60, no RWMA, normal diastolic function  . Hypertension    no meds  . Kidney stones    no current problem  . Migraine   . Nasal turbinate hypertrophy 09/2011   bilat.  . Pelvic inflammatory disease   . Urticaria    OB History  Gravida Para Term Preterm AB Living  5 4 4   1 4   SAB TAB Ectopic Multiple Live Births  1       4    # Outcome Date GA Lbr Len/2nd Weight Sex Delivery Anes PTL Lv  5 SAB 08/2019          4 Term 12/08/11    F Vag-Spont   LIV  3 Term 12/17/01    M Vag-Spont   LIV  2 Term 10/08/00    M Vag-Spont   LIV  1 Term 07/27/98    F Vag-Spont   LIV    Obstetric Comments  1st Menstrual Cycle:  10    1st Pregnancy:  16   Past Surgical History:  Procedure Laterality Date  . CHOLECYSTECTOMY N/A 01/15/2016   Procedure: LAPAROSCOPIC CHOLECYSTECTOMY WITH INTRAOPERATIVE CHOLANGIOGRAM;  Surgeon: Christene Lye, MD;  Location: [facility];  Service: General;  Laterality: N/A;  . COLONOSCOPY  2007   [facility]: pedunculated 20mm polyp in distal sigmoid, sessile 31mm polyp at splenic flexure, larger polyp tubulovillous adenoma and smaller polyp tubular adenoma   . COLONOSCOPY  09-09-15  . COLONOSCOPY WITH PROPOFOL N/A 03/01/2016   Procedure: COLONOSCOPY WITH PROPOFOL;  Surgeon: Garlan Fair, MD;  Location: [facility];  Service: Endoscopy;  Laterality: N/A;  . NASAL SEPTOPLASTY W/ TURBINOPLASTY  10/04/2011   Procedure: NASAL SEPTOPLASTY WITH TURBINATE REDUCTION;  Surgeon: Ascencion Dike, MD;  Location: [facility];  Service: ENT;  Laterality: Bilateral;  . WISDOM TOOTH EXTRACTION  2009   Social History   Socioeconomic History  . Marital status: Single    Spouse name: Not on file  . Number of children: 4  . Years of education: 18  . Highest education level: Not on file  Occupational History  . 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 Smoking status: Current Every Day Smoker    Packs/day:  0.00    Years: 10.00    Pack years: 0.00    Last attempt to quit: 08/23/2008    Years since quitting: 11.1  . Smokeless tobacco: Never Used  . Tobacco comment: 4 cigarettes a day   Substance and Sexual Activity  . Alcohol use: Not Currently    Alcohol/week: 0.0 standard drinks    Comment: ocassionally  . Drug use: No  . Sexual activity: Yes    Partners: Male    Birth control/protection: None  Other Topics Concern  . Not on file  Social History Narrative   Lives at home w/ her children   Right-handed   Drinks occasional sweet tea   Social Determinants of Health   Financial Resource Strain:   . Difficulty of Paying Living Expenses:   Food Insecurity:   .  Worried About Charity fundraiser in the Last Year:   . Arboriculturist in the Last Year:   Transportation Needs:   . Film/video editor (Medical):   Marland Kitchen Lack of Transportation (Non-Medical):   Physical Activity:   . Days of Exercise per Week:   . Minutes of Exercise per Session:   Stress:   . Feeling of Stress :   Social Connections:   . Frequency of Communication with Friends and Family:   . Frequency of Social Gatherings with Friends and Family:   . Attends Religious Services:   . Active Member of Clubs or Organizations:   . Attends Archivist Meetings:   Marland Kitchen Marital Status:   Intimate Partner Violence:   . Fear of Current or Ex-Partner:   . Emotionally Abused:   Marland Kitchen Physically Abused:   . Sexually Abused:    Family History  Problem Relation Age of Onset  . Kidney cancer Mother   . Hypertension Mother   . Migraines Mother   . Multiple sclerosis Mother   . Fibromyalgia Mother   . Liver disease Mother        NASH   . Hypertension Father   . Liver disease Father        liver transplant; had fatty liver disease  . Spina bifida Brother   . Migraines Maternal Grandmother   . Hypertension Other   . Colon cancer Paternal Grandfather   . Allergic rhinitis Daughter   . Asthma Daughter   . Allergic rhinitis Son   . Asthma Son   . Angioedema Neg Hx   . Eczema Neg Hx   . Immunodeficiency Neg Hx   . Urticaria Neg Hx    No current facility-administered medications on file prior to encounter.   Current Outpatient Medications on File Prior to Encounter  Medication Sig Dispense Refill  . clonazePAM (KLONOPIN) 0.5 MG tablet Take 0.25 mg by mouth daily.     Marland Kitchen loratadine (CLARITIN) 10 MG tablet Take 10 mg by mouth daily.    . montelukast (SINGULAIR) 10 MG tablet Take 10 mg by mouth at bedtime.    Marland Kitchen omeprazole (PRILOSEC) 40 MG capsule Take by mouth.    . potassium chloride (KLOR-CON) 10 MEQ tablet Take 10 mEq by mouth 2 (two) times daily.    . Prenat-Fe  Poly-Methfol-FA-DHA (VITAFOL ULTRA) 29-0.6-0.4-200 MG CAPS Take 1 capsule by mouth daily before breakfast. 30 capsule 11  . sertraline (ZOLOFT) 25 MG tablet Take 25 mg by mouth daily.    . Vitamin D, Ergocalciferol, (DRISDOL) 1.25 MG (50000 UNIT) CAPS capsule Take 50,000 Units by mouth every 7 (seven) days.    Marland Kitchen  amoxicillin (AMOXIL) 875 MG tablet Take 875 mg by mouth 2 (two) times daily.    Marland Kitchen FLUoxetine HCl (PROZAC PO) Take by mouth.    . [DISCONTINUED] escitalopram (LEXAPRO) 10 MG tablet Take 10 mg by mouth daily.       Allergies  Allergen Reactions  . Clarithromycin Hives and Itching  . Doxycycline Shortness Of Breath    Chest pain  . Bentyl [Dicyclomine Hcl] Other (See Comments)    DIZZINESS  . Haldol [Haloperidol] Other (See Comments)    Made her feel very jittery and agitated  . Meclizine     Chest pain  . Morphine And Related Other (See Comments)    Made her feel like she was losing her mind  . Toradol [Ketorolac Tromethamine] Other (See Comments)    Made her feel very jittery and agitated  . Macrobid [Nitrofurantoin Monohyd Macro] Rash  . Meclizine Hcl Rash  . Nitrofurantoin Rash  . Sulfa Antibiotics Hives and Rash    I have reviewed patient's Past Medical Hx, Surgical Hx, Family Hx, Social Hx, medications and allergies.   Review of Systems  Constitutional: Negative.   Gastrointestinal: Positive for abdominal pain. Negative for constipation, diarrhea, nausea and vomiting.  Genitourinary: Negative.     OBJECTIVE Patient Vitals for the past 24 hrs:  BP Temp Pulse Resp SpO2  10/22/19 1505 (!) 128/101 -- 85 -- 99 %  10/22/19 1504 -- 98.2 F (36.8 C) -- 16 99 %   Constitutional: Well-developed, well-nourished female in no acute distress.  Cardiovascular: normal rate & rhythm, no murmur Respiratory: normal rate and effort. Lung sounds clear throughout GI: Abd soft, non-tender, Pos BS x 4. No guarding or rebound tenderness MS: Extremities nontender, no edema, normal  ROM Neurologic: Alert and oriented x 4.     LAB RESULTS Results for orders placed or performed during the hospital encounter of 10/22/19 (from the past 24 hour(s))  CBC     Status: None   Collection Time: 10/22/19  3:53 PM  Result Value Ref Range   WBC 8.7 4.0 - 10.5 K/uL   RBC 4.71 3.87 - 5.11 MIL/uL   Hemoglobin 13.6 12.0 - 15.0 g/dL   HCT 41.7 36.0 - 46.0 %   MCV 88.5 80.0 - 100.0 fL   MCH 28.9 26.0 - 34.0 pg   MCHC 32.6 30.0 - 36.0 g/dL   RDW 12.7 11.5 - 15.5 %   Platelets 323 150 - 400 K/uL   nRBC 0.0 0.0 - 0.2 %  hCG, quantitative, pregnancy     Status: None   Collection Time: 10/22/19  3:53 PM  Result Value Ref Range   hCG, Beta Chain, Quant, S <1 <5 mIU/mL  Urinalysis, Routine w reflex microscopic     Status: Abnormal   Collection Time: 10/22/19  4:17 PM  Result Value Ref Range   Color, Urine AMBER (A) YELLOW   APPearance CLOUDY (A) CLEAR   Specific Gravity, Urine 1.024 1.005 - 1.030   pH 6.0 5.0 - 8.0   Glucose, UA NEGATIVE NEGATIVE mg/dL   Hgb urine dipstick SMALL (A) NEGATIVE   Bilirubin Urine NEGATIVE NEGATIVE   Ketones, ur NEGATIVE NEGATIVE mg/dL   Protein, ur 100 (A) NEGATIVE mg/dL   Nitrite NEGATIVE NEGATIVE   Leukocytes,Ua NEGATIVE NEGATIVE   RBC / HPF 6-10 0 - 5 RBC/hpf   WBC, UA 0-5 0 - 5 WBC/hpf   Bacteria, UA RARE (A) NONE SEEN   Squamous Epithelial / LPF 21-50 0 - 5  Mucus PRESENT     IMAGING US OB Transvaginal  Result Date: 10/22/2019 CLINICAL DATA:  Abdominal pain during pregnancy in first trimester. Diagnosed with missed abortion of March. EXAM: TRANSVAGINAL OB ULTRASOUND TECHNIQUE: Transvaginal ultrasound was performed for complete evaluation of the gestation as well as the maternal uterus, adnexal regions, and pelvic cul-de-sac. COMPARISON:  Obstetric ultrasound 08/28/2019 FINDINGS: Intrauterine gestational sac: No longer present. Yolk sac:  Not Visualized. Embryo:  Not Visualized. Maternal uterus/adnexae: Endometrium measures from from tendon  19 mm. There is no fluid in the endometrial canal. No abnormal endometrial blood flow ir hypervascularity. Right ovary appears normal measuring 2.3 x 2.0 x 1.9 cm. The left ovary appears normal measuring 3.1 x 1.9 x 1.9 cm. No adnexal mass or significant free fluid. IMPRESSION: 1. Previous intrauterine gestational sac is no longer seen. 2. No sonographic evidence to suggest retained products of conception. Normal appearance of both ovaries. Electronically Signed   By: Keith Rake M.D.   On: 10/22/2019 17:08    MAU COURSE Orders Placed This Encounter  Procedures  . US OB Transvaginal  . CBC  . hCG, quantitative, pregnancy  . Urinalysis, Routine w reflex microscopic  . Diet NPO time specified  . Discharge patient   No orders of the defined types were placed in this encounter.   MDM RH positive  Can see appointments in care everywhere but can't see imaging results.  Pt afebrile & no leukocytosis.  Ultrasound shows no retained POCs HCG returned while patient in ultrasound & is negative. Patient is reassured by results. Has f/u with her OB later this week.   ASSESSMENT 1. Complete miscarriage   2. Abdominal pain during pregnancy in first trimester     PLAN Discharge home in stable condition.   Allergies as of 10/22/2019      Reactions   Clarithromycin Hives, Itching   Doxycycline Shortness Of Breath   Chest pain   Bentyl [dicyclomine Hcl] Other (See Comments)   DIZZINESS   Haldol [haloperidol] Other (See Comments)   Made her feel very jittery and agitated   Meclizine    Chest pain   Morphine And Related Other (See Comments)   Made her feel like she was losing her mind   Toradol [ketorolac Tromethamine] Other (See Comments)   Made her feel very jittery and agitated   Macrobid [nitrofurantoin Monohyd Macro] Rash   Meclizine Hcl Rash   Nitrofurantoin Rash   Sulfa Antibiotics Hives, Rash      Medication List    STOP taking these medications   amoxicillin 875 MG  tablet Commonly known as: AMOXIL   PROZAC PO     TAKE these medications   clonazePAM 0.5 MG tablet Commonly known as: KLONOPIN Take 0.25 mg by mouth daily.   loratadine 10 MG tablet Commonly known as: CLARITIN Take 10 mg by mouth daily.   montelukast 10 MG tablet Commonly known as: SINGULAIR Take 10 mg by mouth at bedtime.   omeprazole 40 MG capsule Commonly known as: PRILOSEC Take by mouth.   potassium chloride 10 MEQ tablet Commonly known as: KLOR-CON Take 10 mEq by mouth 2 (two) times daily.   sertraline 25 MG tablet Commonly known as: ZOLOFT Take 25 mg by mouth daily.   Vitafol Ultra 29-0.6-0.4-200 MG Caps Take 1 capsule by mouth daily before breakfast.   Vitamin D (Ergocalciferol) 1.25 MG (50000 UNIT) Caps capsule Commonly known as: DRISDOL Take 50,000 Units by mouth every 7 (seven) days.  Jorje Guild, NP 10/22/2019  5:42 PM

## 2019-10-22 NOTE — Discharge Instructions (Signed)
Miscarriage A miscarriage is the loss of an unborn baby (fetus) before the 20th week of pregnancy. Most miscarriages happen during the first 3 months of pregnancy. Sometimes, a miscarriage can happen before a woman knows that she is pregnant. Having a miscarriage can be an emotional experience. If you have had a miscarriage, talk with your health care provider about any questions you may have about miscarrying, the grieving process, and your plans for future pregnancy. What are the causes? A miscarriage may be caused by:  Problems with the genes or chromosomes of the fetus. These problems make it impossible for the baby to develop normally. They are often the result of random errors that occur early in the development of the baby, and are not passed from parent to child (not inherited).  Infection of the cervix or uterus.  Conditions that affect hormone balance in the body.  Problems with the cervix, such as the cervix opening and thinning before pregnancy is at term (cervical insufficiency).  Problems with the uterus. These may include: ? A uterus with an abnormal shape. ? Fibroids in the uterus. ? Congenital abnormalities. These are problems that were present at birth.  Certain medical conditions.  Smoking, drinking alcohol, or using drugs.  Injury (trauma). In many cases, the cause of a miscarriage is not known. What are the signs or symptoms? Symptoms of this condition include:  Vaginal bleeding or spotting, with or without cramps or pain.  Pain or cramping in the abdomen or lower back.  Passing fluid, tissue, or blood clots from the vagina. How is this diagnosed? This condition may be diagnosed based on:  A physical exam.  Ultrasound.  Blood tests.  Urine tests. How is this treated? Treatment for a miscarriage is sometimes not necessary if you naturally pass all the tissue that was in your uterus. If necessary, this condition may be treated with:  Dilation and  curettage (D&C). This is a procedure in which the cervix is stretched open and the lining of the uterus (endometrium) is scraped. This is done only if tissue from the fetus or placenta remains in the body (incomplete miscarriage).  Medicines, such as: ? Antibiotic medicine, to treat infection. ? Medicine to help the body pass any remaining tissue. ? Medicine to reduce (contract) the size of the uterus. These medicines may be given if you have a lot of bleeding. If you have Rh negative blood and your baby was Rh positive, you will need a shot of a medicine called Rh immunoglobulinto protect your future babies from Rh blood problems. "Rh-negative" and "Rh-positive" refer to whether or not the blood has a specific protein found on the surface of red blood cells (Rh factor). Follow these instructions at home: Medicines   Take over-the-counter and prescription medicines only as told by your health care provider.  If you were prescribed antibiotic medicine, take it as told by your health care provider. Do not stop taking the antibiotic even if you start to feel better.  Do not take NSAIDs, such as aspirin and ibuprofen, unless they are approved by your health care provider. These medicines can cause bleeding. Activity  Rest as directed. Ask your health care provider what activities are safe for you.  Have someone help with home and family responsibilities during this time. General instructions  Keep track of the number of sanitary pads you use each day and how soaked (saturated) they are. Write down this information.  Monitor the amount of tissue or blood clots that   you pass from your vagina. Save any large amounts of tissue for your health care provider to examine.  Do not use tampons, douche, or have sex until your health care provider approves.  To help you and your partner with the process of grieving, talk with your health care provider or seek counseling.  When you are ready, meet with  your health care provider to discuss any important steps you should take for your health. Also, discuss steps you should take to have a healthy pregnancy in the future.  Keep all follow-up visits as told by your health care provider. This is important. Where to find more information  The American Congress of Obstetricians and Gynecologists: www.acog.org  [place] Department of Health and Human Services Office of Women's Health: www.womenshealth.gov Contact a health care provider if:  You have a fever or chills.  You have a foul smelling vaginal discharge.  You have more bleeding instead of less. Get help right away if:  You have severe cramps or pain in your back or abdomen.  You pass blood clots or tissue from your vagina that is walnut-sized or larger.  You soak more than 1 regular sanitary pad in an hour.  You become light-headed or weak.  You pass out.  You have feelings of sadness that take over your thoughts, or you have thoughts of hurting yourself. Summary  Most miscarriages happen in the first 3 months of pregnancy. Sometimes miscarriage happens before a woman even knows that she is pregnant.  Follow your health care provider's instruction for home care. Keep all follow-up appointments.  To help you and your partner with the process of grieving, talk with your health care provider or seek counseling. This information is not intended to replace advice given to you by your health care provider. Make sure you discuss any questions you have with your health care provider. Document Revised: 10/05/2018 Document Reviewed: 07/19/2016 Elsevier Patient Education  2020 [employer]  

## 2019-10-22 NOTE — MAU Note (Signed)
Pt states she was told she has a blighted ovum last month was going to try to pass it naturally, but then later the doctor gave her the medicine to take.   Pt states that she took Cytotec last  Wednesday had cramping and a small amount of bleeding that stopped the next day.   Pt reports a light pink spotting this morning with right abdominal pain that radiates to the back.   Pt reports seeing a doctor with [facility] and tried to call them before coming in.

## 2019-11-03 ENCOUNTER — Other Ambulatory Visit: Payer: Self-pay | Admitting: Obstetrics

## 2019-11-03 DIAGNOSIS — E569 Vitamin deficiency, unspecified: Secondary | ICD-10-CM

## 2019-11-29 ENCOUNTER — Emergency Department (HOSPITAL_BASED_OUTPATIENT_CLINIC_OR_DEPARTMENT_OTHER)
Admission: EM | Admit: 2019-11-29 | Discharge: 2019-11-30 | Disposition: A | Discharge status: Home / Self Care | Payer: Medicaid Other | Attending: Emergency Medicine | Admitting: Emergency Medicine

## 2019-11-29 ENCOUNTER — Other Ambulatory Visit: Payer: Self-pay

## 2019-11-29 ENCOUNTER — Emergency Department (HOSPITAL_COMMUNITY)
Admission: EM | Admit: 2019-11-29 | Discharge: 2019-11-29 | Discharge status: Against Medical Advice | Payer: Medicaid Other

## 2019-11-29 ENCOUNTER — Encounter (HOSPITAL_BASED_OUTPATIENT_CLINIC_OR_DEPARTMENT_OTHER): Payer: Self-pay

## 2019-11-29 DIAGNOSIS — F17210 Nicotine dependence, cigarettes, uncomplicated: Secondary | ICD-10-CM | POA: Insufficient documentation

## 2019-11-29 DIAGNOSIS — J0100 Acute maxillary sinusitis, unspecified: Secondary | ICD-10-CM | POA: Insufficient documentation

## 2019-11-29 DIAGNOSIS — M542 Cervicalgia: Secondary | ICD-10-CM | POA: Insufficient documentation

## 2019-11-29 DIAGNOSIS — B373 Candidiasis of vulva and vagina: Secondary | ICD-10-CM | POA: Diagnosis not present

## 2019-11-29 DIAGNOSIS — R05 Cough: Secondary | ICD-10-CM | POA: Insufficient documentation

## 2019-11-29 DIAGNOSIS — R5381 Other malaise: Secondary | ICD-10-CM

## 2019-11-29 DIAGNOSIS — I10 Essential (primary) hypertension: Secondary | ICD-10-CM | POA: Diagnosis not present

## 2019-11-29 DIAGNOSIS — M549 Dorsalgia, unspecified: Secondary | ICD-10-CM | POA: Diagnosis not present

## 2019-11-29 DIAGNOSIS — L292 Pruritus vulvae: Secondary | ICD-10-CM | POA: Diagnosis not present

## 2019-11-29 DIAGNOSIS — B3731 Acute candidiasis of vulva and vagina: Secondary | ICD-10-CM

## 2019-11-29 DIAGNOSIS — Z79899 Other long term (current) drug therapy: Secondary | ICD-10-CM | POA: Insufficient documentation

## 2019-11-29 DIAGNOSIS — R21 Rash and other nonspecific skin eruption: Secondary | ICD-10-CM | POA: Diagnosis not present

## 2019-11-29 NOTE — ED Triage Notes (Addendum)
Pt c/o flu like sx x ~2 weeks-seen by PCP and and UC x 2 for c/o-dx with sinus infection with abx-states she had a neg covid test this week-NAD-steady gait-pt later added c/o vaginal itching x 2 days-denies vaginal d/c

## 2019-11-29 NOTE — ED Provider Notes (Signed)
[facility] EMERGENCY DEPARTMENT Provider Note   CSN: [id number] Arrival date & time: 11/29/19  2241   History Chief Complaint  Patient presents with  . Cough  . Vaginal Itching    Christina Fox is a 39 y.o. female.  The history is provided by the patient.  Cough Vaginal Itching  She has history of hypertension and comes in with generalized malaise.  She has been having sinus pressure for about the last week.  3 days ago, she was seen in urgent care and diagnosed with sinusitis and given a prescription for doxycycline.  She did break out in a rash the next day and has developed some vaginal itching.  She did follow-up at another urgent care and had the antibiotic switched, but she has not gotten the prescription filled and does not know what it was.  Today, she felt generally weak.  She has been running low-grade fevers as high as 99.  There has been cough productive of a small amount of brown sputum first thing in the morning, but no sputum production throughout the day.  There has been no rhinorrhea or postnasal drip.  She denies nausea or vomiting.  She has had more frequent bowel movements but no diarrhea.  She has been complaining of some pain in her upper back and neck but no generalized arthralgias or myalgias.  She has had anosmia for several months and states that she had a blood test which shows that she had COVID-19.  Yesterday, she started having vaginal itching with slight discharge which she suspects is a yeast infection secondary to antibiotics.  She has had difficulty with developing yeast infections in the past and usually takes fluconazole.  Past Medical History:  Diagnosis Date  . Abdominal pain    "right lower quadrant pain and right lower back pain  . Anxiety   . Deviated septum 09/2011  . GERD (gastroesophageal reflux disease)   . Headache(784.0)    sinus  . History of echocardiogram    Echo 11/17: EF 55-60, no RWMA, normal diastolic function  .  Hypertension    no meds  . Kidney stones    no current problem  . Migraine   . Nasal turbinate hypertrophy 09/2011   bilat.  . Pelvic inflammatory disease   . Urticaria     Patient Active Problem List   Diagnosis Date Noted  . Gallstone 01/07/2016  . History of colonic polyps 11/27/2015  . Right lower quadrant abdominal pain 11/27/2015  . HTN (hypertension), benign 11/24/2015  . Abdominal pain 11/16/2015  . Headache 10/13/2015  . 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 Anxiety disorder 11/03/2014    Past Surgical History:  Procedure Laterality Date  . CHOLECYSTECTOMY N/A 01/15/2016   Procedure: LAPAROSCOPIC CHOLECYSTECTOMY WITH INTRAOPERATIVE CHOLANGIOGRAM;  Surgeon: Christene Lye, MD;  Location: [facility];  Service: General;  Laterality: N/A;  . COLONOSCOPY  2007   [facility]: pedunculated 66mm polyp in distal sigmoid, sessile 75mm polyp at splenic flexure, larger polyp tubulovillous adenoma and smaller polyp tubular adenoma   . COLONOSCOPY  09-09-15  . COLONOSCOPY WITH PROPOFOL N/A 03/01/2016   Procedure: COLONOSCOPY WITH PROPOFOL;  Surgeon: Garlan Fair, MD;  Location: [facility];  Service: Endoscopy;  Laterality: N/A;  . NASAL SEPTOPLASTY W/ TURBINOPLASTY  10/04/2011   Procedure: NASAL SEPTOPLASTY WITH TURBINATE REDUCTION;  Surgeon: Ascencion Dike, MD;  Location: [facility];  Service: ENT;  Laterality: Bilateral;  . [place] EXTRACTION  2009  OB History    Gravida  5   Para  4   Term  4   Preterm      AB  1   Living  4     SAB  1   TAB      Ectopic      Multiple      Live Births  4        Obstetric Comments  1st Menstrual Cycle:  10  1st Pregnancy:  52         Family History  Problem Relation Age of Onset  . Kidney cancer Mother   . Hypertension Mother   . Migraines Mother   . Multiple sclerosis Mother   . Fibromyalgia Mother   . Liver disease Mother        NASH   . Hypertension Father   . Liver disease Father        liver  transplant; had fatty liver disease  . Spina bifida Brother   . Migraines Maternal Grandmother   . Hypertension Other   . Colon cancer Paternal Grandfather   . Allergic rhinitis Daughter   . Asthma Daughter   . Allergic rhinitis Son   . Asthma Son   . Angioedema Neg Hx   . Eczema Neg Hx   . Immunodeficiency Neg Hx   . Urticaria Neg Hx     Social History   Tobacco Use  . Smoking status: Current Every Day Smoker    Packs/day: 0.00    Years: 10.00    Pack years: 0.00    Last attempt to quit: 08/23/2008    Years since quitting: 11.2  . Smokeless tobacco: Never Used  Substance Use Topics  . Alcohol use: Yes    Alcohol/week: 0.0 standard drinks    Comment: ocassionally  . Drug use: No    Home Medications Prior to Admission medications   Medication Sig Start Date End Date Taking? Authorizing Provider  clonazePAM (KLONOPIN) 0.5 MG tablet Take 0.25 mg by mouth daily.     [provider]  loratadine (CLARITIN) 10 MG tablet Take 10 mg by mouth daily.    [provider]  montelukast (SINGULAIR) 10 MG tablet Take 10 mg by mouth at bedtime.    [provider]  omeprazole (PRILOSEC) 40 MG capsule Take by mouth.    [provider]  potassium chloride (KLOR-CON) 10 MEQ tablet Take 10 mEq by mouth 2 (two) times daily.    [provider]  Prenat-Fe Poly-Methfol-FA-DHA (VITAFOL ULTRA) 29-0.6-0.4-200 MG CAPS Take 1 capsule by mouth daily before breakfast. 11/01/18   Shelly Bombard, MD  sertraline (ZOLOFT) 25 MG tablet Take 25 mg by mouth daily.    [provider]  Vitamin D, Ergocalciferol, (DRISDOL) 1.25 MG (50000 UNIT) CAPS capsule Take 50,000 Units by mouth every 7 (seven) days.    [provider]  escitalopram (LEXAPRO) 10 MG tablet Take 10 mg by mouth daily.    09/02/11  [provider]    Allergies    Clarithromycin, Doxycycline, Bentyl [dicyclomine hcl], Haldol [haloperidol], Meclizine, Morphine and related,  Toradol [ketorolac tromethamine], Macrobid [nitrofurantoin monohyd macro], Meclizine hcl, Nitrofurantoin, and Sulfa antibiotics  Review of Systems   Review of Systems  Respiratory: Positive for cough.   All other systems reviewed and are negative.   Physical Exam Updated Vital Signs BP (!) 144/85 (BP Location: Left Arm)   Pulse 80   Temp 98.7 F (37.1 C) (Oral)   Resp  18   Ht 5\' 3"  (1.6 m)   Wt 95.7 kg   LMP  (LMP Unknown)   SpO2 100%   BMI 37.38 kg/m   Physical Exam Vitals and nursing note reviewed.   39 year old female, resting comfortably and in no acute distress. Vital signs are significant for elevated blood pressure. Oxygen saturation is 100%, which is normal. Head is normocephalic and atraumatic. PERRLA, EOMI. Oropharynx is clear.  There is mild tenderness over the frontal sinuses, moderate tenderness over the maxillary sinuses.  No nasal drainage is seen. Neck is nontender and supple without adenopathy or JVD. Back is nontender and there is no CVA tenderness. Lungs are clear without rales, wheezes, or rhonchi. Chest is nontender. Heart has regular rate and rhythm without murmur. Abdomen is soft, flat, nontender without masses or hepatosplenomegaly and peristalsis is normoactive. Extremities have no cyanosis or edema, full range of motion is present. Skin is warm and dry without rash. Neurologic: Mental status is normal, cranial nerves are intact, there are no motor or sensory deficits.  ED Results / Procedures / Treatments   Labs (all labs ordered are listed, but only abnormal results are displayed) Labs Reviewed  COMPREHENSIVE METABOLIC PANEL - Abnormal; Notable for the following components:      Result Value   ALT 51 (*)    All other components within normal limits  CBC WITH DIFFERENTIAL/PLATELET - Abnormal; Notable for the following components:   WBC 10.9 (*)    Eosinophils Absolute 0.6 (*)    All other components within normal limits  URINALYSIS, ROUTINE W  REFLEX MICROSCOPIC - Abnormal; Notable for the following components:   APPearance CLOUDY (*)    All other components within normal limits    Procedures Procedures   Medications Ordered in ED Medications  sodium chloride 0.9 % bolus 1,000 mL (has no administration in time range)    ED Course  I have reviewed the triage vital signs and the nursing notes.  Pertinent lab results that were available during my care of the patient were reviewed by me and considered in my medical decision making (see chart for details).  MDM Rules/Calculators/A&P Sinusitis - unclear whether it is allergic, viral, or bacterial.  However, since she has started antibiotics, she should continue antibiotics until completed.  Vaginal itching and discharge is most likely monilial vaginitis and she is given a prescription for fluconazole to take at the completion of her course of antibiotics.  Malaise is suggestive of a viral illness.  Will check screening labs and give IV fluids.  Old records are reviewed confirming recent ED visit with diagnosis of sinusitis.  Of note, COVID-19 antigen was negative at that visit.  Labs are unremarkable.  Patient feels no better after IV fluids.  She is reassured of the benign nature of her lab work-up and is discharged with instructions to take the antibiotic as previously prescribed.  Return precautions discussed.  Final Clinical Impression(s) / ED Diagnoses Final diagnoses:  Acute maxillary sinusitis, recurrence not specified  Malaise  Monilial vaginitis    Rx / DC Orders ED Discharge Orders         Ordered    fluconazole (DIFLUCAN) 150 MG tablet   Once     11/30/19 2202           Delora Fuel, MD 54/27/06 0210

## 2019-11-30 LAB — COMPREHENSIVE METABOLIC PANEL
ALT: 51 U/L — ABNORMAL HIGH (ref 0–44)
AST: 25 U/L (ref 15–41)
Albumin: 4.2 g/dL (ref 3.5–5.0)
Alkaline Phosphatase: 72 U/L (ref 38–126)
Anion gap: 11 (ref 5–15)
BUN: 10 mg/dL (ref 6–20)
CO2: 29 mmol/L (ref 22–32)
Calcium: 9 mg/dL (ref 8.9–10.3)
Chloride: 100 mmol/L (ref 98–111)
Creatinine, Ser: 0.75 mg/dL (ref 0.44–1.00)
GFR calc Af Amer: >60 mL/min (ref >60)
GFR calc non Af Amer: >60 mL/min (ref >60)
Glucose, Bld: 99 mg/dL (ref 70–99)
Potassium: 3.6 mmol/L (ref 3.5–5.1)
Sodium: 140 mmol/L (ref 135–145)
Total Bilirubin: 0.9 mg/dL (ref 0.3–1.2)
Total Protein: 7.7 g/dL (ref 6.5–8.1)

## 2019-11-30 LAB — CBC WITH DIFFERENTIAL/PLATELET
Abs Immature Granulocytes: 0.03 10*3/uL (ref 0.00–0.07)
Basophils Absolute: 0.1 10*3/uL (ref 0.0–0.1)
Basophils Relative: 1 %
Eosinophils Absolute: 0.6 10*3/uL — ABNORMAL HIGH (ref 0.0–0.5)
Eosinophils Relative: 6 %
HCT: 43.7 % (ref 36.0–46.0)
Hemoglobin: 14.2 g/dL (ref 12.0–15.0)
Immature Granulocytes: 0 %
Lymphocytes Relative: 22 %
Lymphs Abs: 2.4 10*3/uL (ref 0.7–4.0)
MCH: 28.9 pg (ref 26.0–34.0)
MCHC: 32.5 g/dL (ref 30.0–36.0)
MCV: 89 fL (ref 80.0–100.0)
Monocytes Absolute: 1 10*3/uL (ref 0.1–1.0)
Monocytes Relative: 9 %
Neutro Abs: 6.7 10*3/uL (ref 1.7–7.7)
Neutrophils Relative %: 62 %
Platelets: 328 10*3/uL (ref 150–400)
RBC: 4.91 MIL/uL (ref 3.87–5.11)
RDW: 12.9 % (ref 11.5–15.5)
WBC: 10.9 10*3/uL — ABNORMAL HIGH (ref 4.0–10.5)
nRBC: 0 % (ref 0.0–0.2)

## 2019-11-30 LAB — URINALYSIS, ROUTINE W REFLEX MICROSCOPIC
Bilirubin Urine: NEGATIVE
Glucose, UA: NEGATIVE mg/dL
Hgb urine dipstick: NEGATIVE
Ketones, ur: NEGATIVE mg/dL
Leukocytes,Ua: NEGATIVE
Nitrite: NEGATIVE
Protein, ur: NEGATIVE mg/dL
Specific Gravity, Urine: 1.025 (ref 1.005–1.030)
pH: 5.5 (ref 5.0–8.0)

## 2019-11-30 MED ORDER — FLUCONAZOLE 150 MG PO TABS: 150.0000 mg | ORAL_TABLET | Freq: Once | ORAL | 0 refills | Status: AC

## 2019-11-30 MED ORDER — SODIUM CHLORIDE 0.9 % IV BOLUS
1000.0000 mL | Freq: Once | INTRAVENOUS | Status: AC
  Administered 2019-11-30: 1000 mL via INTRAVENOUS

## 2019-11-30 NOTE — Discharge Instructions (Signed)
Take the antibiotic which had been prescribed for you until it is completed.  After you have finished taking the antibiotic, take the fluconazole (Diflucan).  Drink plenty of fluids.  Take acetaminophen or ibuprofen as needed for pain or fever.  Return if symptoms are getting worse.

## 2019-12-15 ENCOUNTER — Other Ambulatory Visit: Payer: Self-pay

## 2019-12-15 ENCOUNTER — Encounter (HOSPITAL_COMMUNITY): Payer: Self-pay

## 2019-12-15 ENCOUNTER — Ambulatory Visit (HOSPITAL_COMMUNITY)
Admission: EM | Admit: 2019-12-15 | Discharge: 2019-12-15 | Disposition: A | Discharge status: Home / Self Care | Payer: Medicaid Other

## 2019-12-15 DIAGNOSIS — M62838 Other muscle spasm: Secondary | ICD-10-CM | POA: Diagnosis not present

## 2019-12-15 MED ORDER — CYCLOBENZAPRINE HCL 10 MG PO TABS
10.0000 mg | ORAL_TABLET | Freq: Two times a day (BID) | ORAL | 0 refills | Status: DC | PRN
Start: 2019-12-15 — End: 2020-06-02

## 2019-12-15 NOTE — ED Triage Notes (Signed)
Pt presents to UC with neck pain x 3 weeks. Pt states when she touch her right shoulder blade she feels a knot. Pt taking ibuprofen and Tylenol without relief.

## 2019-12-15 NOTE — Discharge Instructions (Signed)
Take the muscle relaxer as prescribed.  Prefer starting at bedtime due to drowsiness. Heat to the area.  Recommend massage or deep tissue massage Also recommend stretching the upper back and neck area. Try using the tennis ball on the wall to work the area as we spoke about. Follow up as needed for continued or worsening symptoms

## 2019-12-16 NOTE — ED Provider Notes (Signed)
[facility]    CSN: [id number] Arrival date & time: 12/15/19  1751      History   Chief Complaint Chief Complaint  Patient presents with  . Neck Pain    HPI Christina Fox is a 39 y.o. female.    Patient is a 39 year old female past medical history of anxiety, GERD, kidney stones, PID, migraines.  She presents today with right upper back discomfort.  This is been constant over the past 3 weeks.  Describes as "not" and back.  Has been taking ibuprofen and Tylenol without much relief.  Denies any radiation of pain from the area, arm pain, numbness, tingling or weakness.  No specific midline cervical tenderness no injuries to the back or neck.  ROS per HPI      Past Medical History:  Diagnosis Date  . Abdominal pain    "right lower quadrant pain and right lower back pain  . Anxiety   . Deviated septum 09/2011  . GERD (gastroesophageal reflux disease)   . Headache(784.0)    sinus  . History of echocardiogram    Echo 11/17: EF 55-60, no RWMA, normal diastolic function  . Hypertension    no meds  . Kidney stones    no current problem  . Migraine   . Nasal turbinate hypertrophy 09/2011   bilat.  . Pelvic inflammatory disease   . Urticaria     Patient Active Problem List   Diagnosis Date Noted  . Gallstone 01/07/2016  . History of colonic polyps 11/27/2015  . Right lower quadrant abdominal pain 11/27/2015  . HTN (hypertension), benign 11/24/2015  . Abdominal pain 11/16/2015  . Headache 10/13/2015  . 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 Anxiety disorder 11/03/2014    Past Surgical History:  Procedure Laterality Date  . CHOLECYSTECTOMY N/A 01/15/2016   Procedure: LAPAROSCOPIC CHOLECYSTECTOMY WITH INTRAOPERATIVE CHOLANGIOGRAM;  Surgeon: Christene Lye, MD;  Location: [facility];  Service: General;  Laterality: N/A;  . COLONOSCOPY  2007   [facility]: pedunculated 74mm polyp in distal sigmoid, sessile 54mm polyp at splenic flexure, larger polyp tubulovillous  adenoma and smaller polyp tubular adenoma   . COLONOSCOPY  09-09-15  . COLONOSCOPY WITH PROPOFOL N/A 03/01/2016   Procedure: COLONOSCOPY WITH PROPOFOL;  Surgeon: Garlan Fair, MD;  Location: [facility];  Service: Endoscopy;  Laterality: N/A;  . NASAL SEPTOPLASTY W/ TURBINOPLASTY  10/04/2011   Procedure: NASAL SEPTOPLASTY WITH TURBINATE REDUCTION;  Surgeon: Ascencion Dike, MD;  Location: [facility];  Service: ENT;  Laterality: Bilateral;  . WISDOM TOOTH EXTRACTION  2009    OB History    Gravida  5   Para  4   Term  4   Preterm      AB  1   Living  4     SAB  1   TAB      Ectopic      Multiple      Live Births  4        Obstetric Comments  1st Menstrual Cycle:  10  1st Pregnancy:  16          Home Medications    Prior to Admission medications   Medication Sig Start Date End Date Taking? Authorizing Provider  acetaminophen (TYLENOL) 500 MG tablet Take 500 mg by mouth every 6 (six) hours as needed.   Yes [provider]  ibuprofen (ADVIL) 200 MG tablet Take 200 mg by mouth every 6 (six) hours as needed.  Yes [provider]  clonazePAM (KLONOPIN) 0.5 MG tablet Take 0.25 mg by mouth daily.     [provider]  cyclobenzaprine (FLEXERIL) 10 MG tablet Take 1 tablet (10 mg total) by mouth 2 (two) times daily as needed for muscle spasms. 12/15/19   Loura Halt A, NP  loratadine (CLARITIN) 10 MG tablet Take 10 mg by mouth daily.    [provider]  montelukast (SINGULAIR) 10 MG tablet Take 10 mg by mouth at bedtime.    [provider]  omeprazole (PRILOSEC) 40 MG capsule Take by mouth.    [provider]  potassium chloride (KLOR-CON) 10 MEQ tablet Take 10 mEq by mouth 2 (two) times daily.    [provider]  Prenat-Fe Poly-Methfol-FA-DHA (VITAFOL ULTRA) 29-0.6-0.4-200 MG CAPS Take 1 capsule by mouth daily before breakfast. 11/01/18   Shelly Bombard, MD  sertraline (ZOLOFT) 25 MG tablet Take 25  mg by mouth daily.    [provider]  Vitamin D, Ergocalciferol, (DRISDOL) 1.25 MG (50000 UNIT) CAPS capsule Take 50,000 Units by mouth every 7 (seven) days.    [provider]  escitalopram (LEXAPRO) 10 MG tablet Take 10 mg by mouth daily.    09/02/11  [provider]    Family History Family History  Problem Relation Age of Onset  . Kidney cancer Mother   . Hypertension Mother   . Migraines Mother   . Multiple sclerosis Mother   . Fibromyalgia Mother   . Liver disease Mother        NASH   . Hypertension Father   . Liver disease Father        liver transplant; had fatty liver disease  . Spina bifida Brother   . Migraines Maternal Grandmother   . Hypertension Other   . Colon cancer Paternal Grandfather   . Allergic rhinitis Daughter   . Asthma Daughter   . Allergic rhinitis Son   . Asthma Son   . Angioedema Neg Hx   . Eczema Neg Hx   . Immunodeficiency Neg Hx   . Urticaria Neg Hx     Social History Social History   Tobacco Use  . Smoking status: Current Every Day Smoker    Packs/day: 0.00    Years: 10.00    Pack years: 0.00    Last attempt to quit: 08/23/2008    Years since quitting: 11.3  . Smokeless tobacco: Never Used  Vaping Use  . Vaping Use: Never used  Substance Use Topics  . Alcohol use: Yes    Alcohol/week: 0.0 standard drinks    Comment: ocassionally  . Drug use: No     Allergies   Clarithromycin, Doxycycline, Dicyclomine, Sulfasalazine, Bentyl [dicyclomine hcl], Haldol [haloperidol], Meclizine, Morphine and related, Toradol [ketorolac tromethamine], Macrobid [nitrofurantoin monohyd macro], Meclizine hcl, Nitrofurantoin, and Sulfa antibiotics   Review of Systems Review of Systems   Physical Exam Triage Vital Signs ED Triage Vitals  Enc Vitals Group     BP 12/15/19 1839 129/86     Pulse Rate 12/15/19 1839 82     Resp 12/15/19 1839 18     Temp 12/15/19 1839 99.1 F (37.3 C)     Temp Source 12/15/19 1839 Oral      SpO2 12/15/19 1839 99 %     Weight --      Height --      Head Circumference --      Peak Flow --      Pain Score 12/15/19  1837 7     Pain Loc --      Pain Edu? --      Excl. in [place]? --    No data found.  Updated Vital Signs BP 129/86 (BP Location: Left Arm)   Pulse 82   Temp 99.1 F (37.3 C) (Oral)   Resp 18   LMP 11/22/2019 (Exact Date)   SpO2 99%   Visual Acuity Right Eye Distance:   Left Eye Distance:   Bilateral Distance:    Right Eye Near:   Left Eye Near:    Bilateral Near:     Physical Exam Vitals and nursing note reviewed.  Constitutional:      General: She is not in acute distress.    Appearance: Normal appearance. She is not ill-appearing, toxic-appearing or diaphoretic.  HENT:     Head: Normocephalic.     Nose: Nose normal.  Eyes:     Conjunctiva/sclera: Conjunctivae normal.  Pulmonary:     Effort: Pulmonary effort is normal.  Musculoskeletal:        General: Normal range of motion.     Cervical back: Normal range of motion.       Back:     Comments: TTP with palpable knot No midline tenderness  Normal ROM  Skin:    General: Skin is warm and dry.     Findings: No rash.  Neurological:     Mental Status: She is alert.  Psychiatric:        Mood and Affect: Mood normal.      UC Treatments / Results  Labs (all labs ordered are listed, but only abnormal results are displayed) Labs Reviewed - No data to display  EKG   Radiology No results found.  Procedures Procedures (including critical care time)  Medications Ordered in UC Medications - No data to display  Initial Impression / Assessment and Plan / UC Course  I have reviewed the triage vital signs and the nursing notes.  Pertinent labs & imaging results that were available during my care of the patient were reviewed by me and considered in my medical decision making (see chart for details).     Trapezius muscle spasm Treating with Flexeril as needed.  Recommended heat to the  area, deep tissue massage, stretch in the upper back and neck area.  Also recommended using tennis ball to work out the knot in the upper back area. Follow up as needed for continued or worsening symptoms  Final Clinical Impressions(s) / UC Diagnoses   Final diagnoses:  Trapezius muscle spasm     Discharge Instructions     Take the muscle relaxer as prescribed.  Prefer starting at bedtime due to drowsiness. Heat to the area.  Recommend massage or deep tissue massage Also recommend stretching the upper back and neck area. Try using the tennis ball on the wall to work the area as we spoke about. Follow up as needed for continued or worsening symptoms     ED Prescriptions    Medication Sig Dispense Auth. Provider   cyclobenzaprine (FLEXERIL) 10 MG tablet Take 1 tablet (10 mg total) by mouth 2 (two) times daily as needed for muscle spasms. 20 tablet Loura Halt A, NP     PDMP not reviewed this encounter.   Orvan July, NP 12/16/19 [phone]

## 2020-01-13 ENCOUNTER — Other Ambulatory Visit: Payer: Self-pay

## 2020-01-13 ENCOUNTER — Encounter (HOSPITAL_BASED_OUTPATIENT_CLINIC_OR_DEPARTMENT_OTHER): Payer: Self-pay | Admitting: *Deleted

## 2020-01-13 DIAGNOSIS — J069 Acute upper respiratory infection, unspecified: Secondary | ICD-10-CM | POA: Insufficient documentation

## 2020-01-13 DIAGNOSIS — Z5321 Procedure and treatment not carried out due to patient leaving prior to being seen by health care provider: Secondary | ICD-10-CM | POA: Diagnosis not present

## 2020-01-13 DIAGNOSIS — R519 Headache, unspecified: Secondary | ICD-10-CM | POA: Diagnosis not present

## 2020-01-13 NOTE — ED Triage Notes (Signed)
Pt c/o Uri symptoms x 6 days

## 2020-01-14 ENCOUNTER — Emergency Department (HOSPITAL_BASED_OUTPATIENT_CLINIC_OR_DEPARTMENT_OTHER)
Admission: EM | Admit: 2020-01-14 | Discharge: 2020-01-14 | Disposition: A | Discharge status: Home / Self Care | Payer: Medicaid Other | Attending: Emergency Medicine | Admitting: Emergency Medicine

## 2020-01-14 NOTE — ED Notes (Signed)
Pt called  No response from lobby  

## 2020-01-14 NOTE — ED Notes (Signed)
Called to take to room  No answer from lobby

## 2020-03-01 ENCOUNTER — Other Ambulatory Visit: Payer: Self-pay

## 2020-03-01 ENCOUNTER — Emergency Department (HOSPITAL_BASED_OUTPATIENT_CLINIC_OR_DEPARTMENT_OTHER)
Admission: EM | Admit: 2020-03-01 | Discharge: 2020-03-01 | Disposition: A | Discharge status: Home / Self Care | Payer: Medicaid Other | Attending: Emergency Medicine | Admitting: Emergency Medicine

## 2020-03-01 ENCOUNTER — Encounter (HOSPITAL_BASED_OUTPATIENT_CLINIC_OR_DEPARTMENT_OTHER): Payer: Self-pay | Admitting: Emergency Medicine

## 2020-03-01 DIAGNOSIS — F17200 Nicotine dependence, unspecified, uncomplicated: Secondary | ICD-10-CM | POA: Diagnosis not present

## 2020-03-01 DIAGNOSIS — R531 Weakness: Secondary | ICD-10-CM | POA: Diagnosis present

## 2020-03-01 DIAGNOSIS — R5383 Other fatigue: Secondary | ICD-10-CM | POA: Diagnosis not present

## 2020-03-01 DIAGNOSIS — I10 Essential (primary) hypertension: Secondary | ICD-10-CM | POA: Insufficient documentation

## 2020-03-01 DIAGNOSIS — E876 Hypokalemia: Secondary | ICD-10-CM | POA: Diagnosis not present

## 2020-03-01 DIAGNOSIS — Z79899 Other long term (current) drug therapy: Secondary | ICD-10-CM | POA: Insufficient documentation

## 2020-03-01 LAB — BASIC METABOLIC PANEL
Anion gap: 12 (ref 5–15)
BUN: 13 mg/dL (ref 6–20)
CO2: 24 mmol/L (ref 22–32)
Calcium: 8.8 mg/dL — ABNORMAL LOW (ref 8.9–10.3)
Chloride: 99 mmol/L (ref 98–111)
Creatinine, Ser: 0.79 mg/dL (ref 0.44–1.00)
GFR calc Af Amer: >60 mL/min (ref >60)
GFR calc non Af Amer: >60 mL/min (ref >60)
Glucose, Bld: 112 mg/dL — ABNORMAL HIGH (ref 70–99)
Potassium: 3.3 mmol/L — ABNORMAL LOW (ref 3.5–5.1)
Sodium: 135 mmol/L (ref 135–145)

## 2020-03-01 LAB — URINALYSIS, ROUTINE W REFLEX MICROSCOPIC
Bilirubin Urine: NEGATIVE
Glucose, UA: NEGATIVE mg/dL
Hgb urine dipstick: NEGATIVE
Ketones, ur: NEGATIVE mg/dL
Leukocytes,Ua: NEGATIVE
Nitrite: NEGATIVE
Protein, ur: NEGATIVE mg/dL
Specific Gravity, Urine: 1.02 (ref 1.005–1.030)
pH: 6 (ref 5.0–8.0)

## 2020-03-01 LAB — CBC
HCT: 42.6 % (ref 36.0–46.0)
Hemoglobin: 13.6 g/dL (ref 12.0–15.0)
MCH: 29 pg (ref 26.0–34.0)
MCHC: 31.9 g/dL (ref 30.0–36.0)
MCV: 90.8 fL (ref 80.0–100.0)
Platelets: 274 10*3/uL (ref 150–400)
RBC: 4.69 MIL/uL (ref 3.87–5.11)
RDW: 13.5 % (ref 11.5–15.5)
WBC: 10.3 10*3/uL (ref 4.0–10.5)
nRBC: 0 % (ref 0.0–0.2)

## 2020-03-01 LAB — HEPATIC FUNCTION PANEL
ALT: 43 U/L (ref 0–44)
AST: 27 U/L (ref 15–41)
Albumin: 4.1 g/dL (ref 3.5–5.0)
Alkaline Phosphatase: 70 U/L (ref 38–126)
Bilirubin, Direct: 0.1 mg/dL (ref 0.0–0.2)
Indirect Bilirubin: 0.3 mg/dL (ref 0.3–0.9)
Total Bilirubin: 0.4 mg/dL (ref 0.3–1.2)
Total Protein: 7.4 g/dL (ref 6.5–8.1)

## 2020-03-01 LAB — PREGNANCY, URINE: Preg Test, Ur: NEGATIVE

## 2020-03-01 MED ORDER — POTASSIUM CHLORIDE ER 10 MEQ PO TBCR
10.0000 meq | EXTENDED_RELEASE_TABLET | Freq: Two times a day (BID) | ORAL | 0 refills | Status: DC
Start: 2020-03-01 — End: 2020-06-02

## 2020-03-01 NOTE — Discharge Instructions (Signed)
Follow-up with [facility].  Potassium a little low.  Take the potassium supplement as directed for the next 3 days.  Also some information about low potassium provided.  Still work-up here today without any acute findings.  As we discussed liver function tests were still pending.  But that will be available on my chart and [facility] will be had a follow-up on that as well.

## 2020-03-01 NOTE — ED Notes (Signed)
ED Provider at bedside. 

## 2020-03-01 NOTE — ED Triage Notes (Signed)
Pt arrives pov, normal gait to triage with c/o fatigue and sinus congestion  endorses x 2 weeks. Covid neg Thursday. Pt also reports lower back pain, denies dysuria and would like pregnancy test. Recent treatment on Thursday at pcp, received abx and steroid injection

## 2020-03-01 NOTE — ED Provider Notes (Signed)
[facility] EMERGENCY DEPARTMENT Provider Note   CSN: [id number] Arrival date & time: 03/01/20  1449     History No chief complaint on file.   Christina Fox is a 39 y.o. female.  Patient presenting with a complaint of fatigue generalized weakness some sinus congestion.  Ongoing for 2 weeks.  Had a negative Covid test on Thursday.  Has some mild low back pain discomfort but no radiation to the legs no lower extremity numbness or weakness.  No dysuria.  Requested a pregnancy test.  Patient was treated by primary care office on Thursday with a questionable sinus infection was given antibiotic and steroid injection.  Patient states she is no better.  Denies any headaches denies any fevers.  Nuys any abdominal pain nausea or vomiting.  No shortness of breath no chest pain        Past Medical History:  Diagnosis Date  . Abdominal pain    "right lower quadrant pain and right lower back pain  . Anxiety   . Deviated septum 09/2011  . GERD (gastroesophageal reflux disease)   . Headache(784.0)    sinus  . History of echocardiogram    Echo 11/17: EF 55-60, no RWMA, normal diastolic function  . Hypertension    no meds  . Kidney stones    no current problem  . Migraine   . Nasal turbinate hypertrophy 09/2011   bilat.  . Pelvic inflammatory disease   . Urticaria     Patient Active Problem List   Diagnosis Date Noted  . Gallstone 01/07/2016  . History of colonic polyps 11/27/2015  . Right lower quadrant abdominal pain 11/27/2015  . HTN (hypertension), benign 11/24/2015  . Abdominal pain 11/16/2015  . Headache 10/13/2015  . 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 Anxiety disorder 11/03/2014    Past Surgical History:  Procedure Laterality Date  . CHOLECYSTECTOMY N/A 01/15/2016   Procedure: LAPAROSCOPIC CHOLECYSTECTOMY WITH INTRAOPERATIVE CHOLANGIOGRAM;  Surgeon: Christene Lye, MD;  Location: [facility];  Service: General;  Laterality: N/A;  . COLONOSCOPY  2007   [facility]:  pedunculated 39mm polyp in distal sigmoid, sessile 62mm polyp at splenic flexure, larger polyp tubulovillous adenoma and smaller polyp tubular adenoma   . COLONOSCOPY  09-09-15  . COLONOSCOPY WITH PROPOFOL N/A 03/01/2016   Procedure: COLONOSCOPY WITH PROPOFOL;  Surgeon: Garlan Fair, MD;  Location: [facility];  Service: Endoscopy;  Laterality: N/A;  . NASAL SEPTOPLASTY W/ TURBINOPLASTY  10/04/2011   Procedure: NASAL SEPTOPLASTY WITH TURBINATE REDUCTION;  Surgeon: Ascencion Dike, MD;  Location: [place];  Service: ENT;  Laterality: Bilateral;  . WISDOM TOOTH EXTRACTION  2009     OB History    Gravida  5   Para  4   Term  4   Preterm      AB  1   Living  4     SAB  1   TAB      Ectopic      Multiple      Live Births  4        Obstetric Comments  1st Menstrual Cycle:  10  1st Pregnancy:  27         Family History  Problem Relation Age of Onset  . Kidney cancer Mother   . Hypertension Mother   . Migraines Mother   . Multiple sclerosis Mother   . Fibromyalgia Mother   . Liver disease Mother        NASH   .  Hypertension Father   . Liver disease Father        liver transplant; had fatty liver disease  . Spina bifida Brother   . Migraines Maternal Grandmother   . Hypertension Other   . Colon cancer Paternal Grandfather   . Allergic rhinitis Daughter   . Asthma Daughter   . Allergic rhinitis Son   . Asthma Son   . Angioedema Neg Hx   . Eczema Neg Hx   . Immunodeficiency Neg Hx   . Urticaria Neg Hx     Social History   Tobacco Use  . Smoking status: Current Every Day Smoker    Packs/day: 0.00    Years: 10.00    Pack years: 0.00    Last attempt to quit: 08/23/2008    Years since quitting: 11.5  . Smokeless tobacco: Never Used  Vaping Use  . Vaping Use: Never used  Substance Use Topics  . Alcohol use: Yes    Alcohol/week: 0.0 standard drinks    Comment: ocassionally  . Drug use: No    Home Medications Prior to Admission  medications   Medication Sig Start Date End Date Taking? Authorizing Provider  acetaminophen (TYLENOL) 500 MG tablet Take 500 mg by mouth every 6 (six) hours as needed.    [provider]  clonazePAM (KLONOPIN) 0.5 MG tablet Take 0.25 mg by mouth daily.     [provider]  cyclobenzaprine (FLEXERIL) 10 MG tablet Take 1 tablet (10 mg total) by mouth 2 (two) times daily as needed for muscle spasms. 12/15/19   Loura Halt A, NP  ibuprofen (ADVIL) 200 MG tablet Take 200 mg by mouth every 6 (six) hours as needed.    [provider]  loratadine (CLARITIN) 10 MG tablet Take 10 mg by mouth daily.    [provider]  montelukast (SINGULAIR) 10 MG tablet Take 10 mg by mouth at bedtime.    [provider]  omeprazole (PRILOSEC) 40 MG capsule Take by mouth.    [provider]  potassium chloride (KLOR-CON) 10 MEQ tablet Take 10 mEq by mouth 2 (two) times daily.    [provider]  potassium chloride (KLOR-CON) 10 MEQ tablet Take 1 tablet (10 mEq total) by mouth 2 (two) times daily for 3 days. 03/01/20 03/04/20  Fredia Sorrow, MD  Prenat-Fe Poly-Methfol-FA-DHA (VITAFOL ULTRA) 29-0.6-0.4-200 MG CAPS Take 1 capsule by mouth daily before breakfast. 11/01/18   Shelly Bombard, MD  sertraline (ZOLOFT) 25 MG tablet Take 25 mg by mouth daily.    [provider]  Vitamin D, Ergocalciferol, (DRISDOL) 1.25 MG (50000 UNIT) CAPS capsule Take 50,000 Units by mouth every 7 (seven) days.    [provider]  escitalopram (LEXAPRO) 10 MG tablet Take 10 mg by mouth daily.    09/02/11  [provider]    Allergies    Clarithromycin, Doxycycline, Dicyclomine, Sulfasalazine, Bentyl [dicyclomine hcl], Haldol [haloperidol], Meclizine, Morphine and related, Toradol [ketorolac tromethamine], Macrobid [nitrofurantoin monohyd macro], Meclizine hcl, Nitrofurantoin, and Sulfa antibiotics  Review of Systems   Review of Systems  Constitutional:  Positive for fatigue. Negative for chills and fever.  HENT: Positive for congestion and sinus pressure. Negative for rhinorrhea and sore throat.   Eyes: Negative for visual disturbance.  Respiratory: Negative for cough and shortness of breath.   Cardiovascular: Negative for chest pain and leg swelling.  Gastrointestinal: Negative for abdominal pain, diarrhea, nausea and vomiting.  Genitourinary: Negative for dysuria.  Musculoskeletal: Negative for back pain and  neck pain.  Skin: Negative for rash.  Neurological: Positive for weakness. Negative for dizziness, light-headedness and headaches.  Hematological: Does not bruise/bleed easily.  Psychiatric/Behavioral: Negative for confusion.    Physical Exam Updated Vital Signs BP (!) 145/84 (BP Location: Right Arm)   Pulse 73   Temp 98.4 F (36.9 C) (Oral)   Resp 18   Ht 1.6 m (5\' 3" )   Wt 93.4 kg   LMP 02/07/2020 (Approximate)   SpO2 99%   BMI 36.49 kg/m   Physical Exam Vitals and nursing note reviewed.  Constitutional:      General: She is not in acute distress.    Appearance: Normal appearance. She is well-developed. She is not ill-appearing or toxic-appearing.  HENT:     Head: Normocephalic and atraumatic.  Eyes:     Extraocular Movements: Extraocular movements intact.     Conjunctiva/sclera: Conjunctivae normal.     Pupils: Pupils are equal, round, and reactive to light.  Cardiovascular:     Rate and Rhythm: Normal rate and regular rhythm.     Heart sounds: No murmur heard.   Pulmonary:     Effort: Pulmonary effort is normal. No respiratory distress.     Breath sounds: Normal breath sounds.  Abdominal:     Palpations: Abdomen is soft.     Tenderness: There is no abdominal tenderness.  Musculoskeletal:        General: Normal range of motion.     Cervical back: Normal range of motion and neck supple.  Skin:    General: Skin is warm and dry.  Neurological:     General: No focal deficit present.     Mental Status: She  is alert and oriented to person, place, and time.     Cranial Nerves: No cranial nerve deficit.     Sensory: No sensory deficit.     Motor: No weakness.     ED Results / Procedures / Treatments   Labs (all labs ordered are listed, but only abnormal results are displayed) Labs Reviewed  BASIC METABOLIC PANEL - Abnormal; Notable for the following components:      Result Value   Potassium 3.3 (*)    Glucose, Bld 112 (*)    Calcium 8.8 (*)    All other components within normal limits  PREGNANCY, URINE  URINALYSIS, ROUTINE W REFLEX MICROSCOPIC  CBC  HEPATIC FUNCTION PANEL    EKG EKG Interpretation  Date/Time:  Sunday March 01 2020 20:04:16 EDT Ventricular Rate:  86 PR Interval:  140 QRS Duration: 86 QT Interval:  408 QTC Calculation: 488 R Axis:   88 Text Interpretation: Normal sinus rhythm ST & T wave abnormality, consider inferior ischemia Prolonged QT Abnormal ECG Confirmed by Fredia Sorrow [phone]) on 03/01/2020 10:28:50 PM   Radiology No results found.  Procedures Procedures (including critical care time)  Medications Ordered in ED Medications - No data to display  ED Course  I have reviewed the triage vital signs and the nursing notes.  Pertinent labs & imaging results that were available during my care of the patient were reviewed by me and considered in my medical decision making (see chart for details).    MDM Rules/Calculators/A&P                          Patient nontoxic no acute distress.  Lab work-up without any significant findings other than some mild hypokalemia with potassium of 3.3.  Pregnancy test negative urinalysis negative.  No leukocytosis.  No anemia.  Patient's had her gallbladder removed in the past.  Hepatic panel was ordered to complement the basic metabolic panel.  But not back yet.  But probably will not change any need for admission.  Patient can follow-up with [facility] clinic.  That will be available for her on MyChart.  We will go  ahead and treat for the mild hypokalemia.  Patient states sometimes that makes her feel weak or fatigued.  EKG without acute findings other than some ST-T wave changes inferiorly.  The patient without any chest pain.  No significant cardiac risk factors.  Other than hypertension.    Final Clinical Impression(s) / ED Diagnoses Final diagnoses:  Weakness generalized  Hypokalemia    Rx / DC Orders ED Discharge Orders         Ordered    potassium chloride (KLOR-CON) 10 MEQ tablet  2 times daily        03/01/20 2309           Fredia Sorrow, MD 03/01/20 2317

## 2020-03-01 NOTE — ED Notes (Signed)
Pt endorses CP when reassessed

## 2020-03-26 ENCOUNTER — Emergency Department (HOSPITAL_COMMUNITY)
Admission: EM | Admit: 2020-03-26 | Discharge: 2020-03-27 | Disposition: A | Discharge status: Home / Self Care | Payer: Medicaid Other | Attending: Emergency Medicine | Admitting: Emergency Medicine

## 2020-03-26 ENCOUNTER — Emergency Department (HOSPITAL_COMMUNITY): Payer: Medicaid Other

## 2020-03-26 ENCOUNTER — Other Ambulatory Visit: Payer: Self-pay

## 2020-03-26 ENCOUNTER — Encounter (HOSPITAL_COMMUNITY): Payer: Self-pay | Admitting: Emergency Medicine

## 2020-03-26 DIAGNOSIS — I10 Essential (primary) hypertension: Secondary | ICD-10-CM | POA: Insufficient documentation

## 2020-03-26 DIAGNOSIS — R079 Chest pain, unspecified: Secondary | ICD-10-CM | POA: Diagnosis present

## 2020-03-26 DIAGNOSIS — R0781 Pleurodynia: Secondary | ICD-10-CM | POA: Insufficient documentation

## 2020-03-26 DIAGNOSIS — Z79899 Other long term (current) drug therapy: Secondary | ICD-10-CM | POA: Insufficient documentation

## 2020-03-26 DIAGNOSIS — F17200 Nicotine dependence, unspecified, uncomplicated: Secondary | ICD-10-CM | POA: Diagnosis not present

## 2020-03-26 LAB — CBC
HCT: 41.6 % (ref 36.0–46.0)
Hemoglobin: 13.3 g/dL (ref 12.0–15.0)
MCH: 29.2 pg (ref 26.0–34.0)
MCHC: 32 g/dL (ref 30.0–36.0)
MCV: 91.4 fL (ref 80.0–100.0)
Platelets: 258 10*3/uL (ref 150–400)
RBC: 4.55 MIL/uL (ref 3.87–5.11)
RDW: 13.7 % (ref 11.5–15.5)
WBC: 10.6 10*3/uL — ABNORMAL HIGH (ref 4.0–10.5)
nRBC: 0 % (ref 0.0–0.2)

## 2020-03-26 LAB — BASIC METABOLIC PANEL
Anion gap: 10 (ref 5–15)
BUN: 12 mg/dL (ref 6–20)
CO2: 24 mmol/L (ref 22–32)
Calcium: 9.2 mg/dL (ref 8.9–10.3)
Chloride: 103 mmol/L (ref 98–111)
Creatinine, Ser: 0.8 mg/dL (ref 0.44–1.00)
GFR calc Af Amer: >60 mL/min (ref >60)
GFR calc non Af Amer: >60 mL/min (ref >60)
Glucose, Bld: 91 mg/dL (ref 70–99)
Potassium: 3.9 mmol/L (ref 3.5–5.1)
Sodium: 137 mmol/L (ref 135–145)

## 2020-03-26 LAB — TROPONIN I (HIGH SENSITIVITY): Troponin I (High Sensitivity): 5 ng/L (ref <18)

## 2020-03-26 LAB — I-STAT BETA HCG BLOOD, ED (MC, WL, AP ONLY): I-stat hCG, quantitative: <5 m[IU]/mL (ref <5)

## 2020-03-26 NOTE — ED Notes (Signed)
Pt was upset about having to wait as long as she had after someone told her that she would be next. Pt said she wanted the IV taken out so she could leave and go to [facility] and actually get seen and not be lied to after being sent by her doctor and coming in by EMS.

## 2020-03-26 NOTE — ED Triage Notes (Signed)
Pt c/o cp and sob for the past few days, 324 mg ASA and one nitro sl given by EMS pta. BP 143/86, HR -92, R 18, SPO2 98% RA.

## 2020-03-27 ENCOUNTER — Other Ambulatory Visit: Payer: Self-pay

## 2020-03-27 ENCOUNTER — Encounter (HOSPITAL_COMMUNITY): Payer: Self-pay

## 2020-03-27 ENCOUNTER — Emergency Department (HOSPITAL_COMMUNITY)
Admission: EM | Admit: 2020-03-27 | Discharge: 2020-03-27 | Disposition: A | Discharge status: Home / Self Care | Payer: Medicaid Other | Source: Home / Self Care | Attending: Emergency Medicine | Admitting: Emergency Medicine

## 2020-03-27 DIAGNOSIS — R0781 Pleurodynia: Secondary | ICD-10-CM | POA: Insufficient documentation

## 2020-03-27 DIAGNOSIS — I10 Essential (primary) hypertension: Secondary | ICD-10-CM | POA: Insufficient documentation

## 2020-03-27 DIAGNOSIS — Z79899 Other long term (current) drug therapy: Secondary | ICD-10-CM | POA: Insufficient documentation

## 2020-03-27 DIAGNOSIS — F17200 Nicotine dependence, unspecified, uncomplicated: Secondary | ICD-10-CM | POA: Insufficient documentation

## 2020-03-27 LAB — D-DIMER, QUANTITATIVE: D-Dimer, Quant: 0.28 ug/mL-FEU (ref 0.00–0.50)

## 2020-03-27 LAB — TROPONIN I (HIGH SENSITIVITY)
Troponin I (High Sensitivity): 6 ng/L (ref <18)
Troponin I (High Sensitivity): 6 ng/L (ref <18)

## 2020-03-27 NOTE — ED Provider Notes (Signed)
[facility] DEPT Provider Note   CSN: [id number] Arrival date & time: 03/27/20  0023     History Chief Complaint  Patient presents with  . Chest Pain    Christina Fox is a 39 y.o. female.  HPI     39 year old female comes in a chief complaint of chest pain. Patient reports that she has been having a cough for the last month or so.  She has been seeing her PCP and has received antibiotics for 17 days.  Today she had chest pain while in the clinic and was advised to come to the ER.  Patient's chest pain is central, described as severe pain that is nonradiating.  There is no specific evoking factor but the pain is worse with cough and deep inspiration.  Pt has no hx of PE, DVT and denies any exogenous hormone (testosterone / estrogen) use, long distance travels or surgery in the past 6 weeks, active cancer, recent immobilization.  Patient does indicate that she had COVID-19 in July and since then she has continued to feel weak.  Patient does not use any drugs and denies any premature CAD in the family.  Past Medical History:  Diagnosis Date  . Abdominal pain    "right lower quadrant pain and right lower back pain  . Anxiety   . Deviated septum 09/2011  . GERD (gastroesophageal reflux disease)   . Headache(784.0)    sinus  . History of echocardiogram    Echo 11/17: EF 55-60, no RWMA, normal diastolic function  . Hypertension    no meds  . Kidney stones    no current problem  . Migraine   . Nasal turbinate hypertrophy 09/2011   bilat.  . Pelvic inflammatory disease   . Urticaria     Patient Active Problem List   Diagnosis Date Noted  . Gallstone 01/07/2016  . History of colonic polyps 11/27/2015  . Right lower quadrant abdominal pain 11/27/2015  . HTN (hypertension), benign 11/24/2015  . Abdominal pain 11/16/2015  . Headache 10/13/2015  . 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 Anxiety disorder 11/03/2014    Past Surgical History:  Procedure  Laterality Date  . CHOLECYSTECTOMY N/A 01/15/2016   Procedure: LAPAROSCOPIC CHOLECYSTECTOMY WITH INTRAOPERATIVE CHOLANGIOGRAM;  Surgeon: Christene Lye, MD;  Location: [facility];  Service: General;  Laterality: N/A;  . COLONOSCOPY  2007   [facility]: pedunculated 51mm polyp in distal sigmoid, sessile 72mm polyp at splenic flexure, larger polyp tubulovillous adenoma and smaller polyp tubular adenoma   . COLONOSCOPY  09-09-15  . COLONOSCOPY WITH PROPOFOL N/A 03/01/2016   Procedure: COLONOSCOPY WITH PROPOFOL;  Surgeon: Garlan Fair, MD;  Location: [facility];  Service: Endoscopy;  Laterality: N/A;  . NASAL SEPTOPLASTY W/ TURBINOPLASTY  10/04/2011   Procedure: NASAL SEPTOPLASTY WITH TURBINATE REDUCTION;  Surgeon: Ascencion Dike, MD;  Location: [facility];  Service: ENT;  Laterality: Bilateral;  . WISDOM TOOTH EXTRACTION  2009     OB History    Gravida  5   Para  4   Term  4   Preterm      AB  1   Living  4     SAB  1   TAB      Ectopic      Multiple      Live Births  4        Obstetric Comments  1st Menstrual Cycle:  10  1st Pregnancy:  36  Family History  Problem Relation Age of Onset  . Kidney cancer Mother   . Hypertension Mother   . Migraines Mother   . Multiple sclerosis Mother   . Fibromyalgia Mother   . Liver disease Mother        NASH   . Hypertension Father   . Liver disease Father        liver transplant; had fatty liver disease  . Spina bifida Brother   . Migraines Maternal Grandmother   . Hypertension Other   . Colon cancer Paternal Grandfather   . Allergic rhinitis Daughter   . Asthma Daughter   . Allergic rhinitis Son   . Asthma Son   . Angioedema Neg Hx   . Eczema Neg Hx   . Immunodeficiency Neg Hx   . Urticaria Neg Hx     Social History   Tobacco Use  . Smoking status: Current Every Day Smoker    Packs/day: 0.00    Years: 10.00    Pack years: 0.00    Last attempt to quit: 08/23/2008    Years since  quitting: 11.6  . Smokeless tobacco: Never Used  Vaping Use  . Vaping Use: Never used  Substance Use Topics  . Alcohol use: Yes    Alcohol/week: 0.0 standard drinks    Comment: ocassionally  . Drug use: No    Home Medications Prior to Admission medications   Medication Sig Start Date End Date Taking? Authorizing Provider  acetaminophen (TYLENOL) 500 MG tablet Take 500-1,000 mg by mouth every 6 (six) hours as needed for mild pain.    Yes [provider]  albuterol (VENTOLIN HFA) 108 (90 Base) MCG/ACT inhaler Inhale 2 puffs into the lungs every 6 (six) hours as needed for wheezing or shortness of breath.   Yes [provider]  clonazePAM (KLONOPIN) 0.5 MG tablet Take 0.5 mg by mouth 2 (two) times daily as needed for anxiety.    Yes [provider]  ibuprofen (ADVIL) 200 MG tablet Take 200 mg by mouth every 6 (six) hours as needed for moderate pain.    Yes [provider]  levofloxacin (LEVAQUIN) 250 MG tablet Take 250 mg by mouth daily.   Yes [provider]  loratadine (CLARITIN) 10 MG tablet Take 10 mg by mouth daily.   Yes [provider]  montelukast (SINGULAIR) 10 MG tablet Take 10 mg by mouth at bedtime.   Yes [provider]  omeprazole (PRILOSEC) 40 MG capsule Take 40 mg by mouth daily.    Yes [provider]  potassium chloride (KLOR-CON) 10 MEQ tablet Take 10 mEq by mouth daily.    Yes [provider]  Prenat-Fe Poly-Methfol-FA-DHA (VITAFOL ULTRA) 29-0.6-0.4-200 MG CAPS Take 1 capsule by mouth daily before breakfast. 11/01/18  Yes Shelly Bombard, MD  Vitamin D, Ergocalciferol, (DRISDOL) 1.25 MG (50000 UNIT) CAPS capsule Take 50,000 Units by mouth See admin instructions. Twice weekly   Yes [provider]  cyclobenzaprine (FLEXERIL) 10 MG tablet Take 1 tablet (10 mg total) by mouth 2 (two) times daily as needed for muscle spasms. Patient not taking: Reported on 03/27/2020 12/15/19   Loura Halt  A, NP  potassium chloride (KLOR-CON) 10 MEQ tablet Take 1 tablet (10 mEq total) by mouth 2 (two) times daily for 3 days. Patient not taking: Reported on 03/27/2020 03/01/20 03/27/20  Fredia Sorrow, MD  sertraline (ZOLOFT) 25 MG tablet Take 25 mg by mouth daily.    [provider]  escitalopram (  LEXAPRO) 10 MG tablet Take 10 mg by mouth daily.    09/02/11  [provider]    Allergies    Clarithromycin, Doxycycline, Dicyclomine, Sulfasalazine, Bentyl [dicyclomine hcl], Haldol [haloperidol], Meclizine, Morphine and related, Toradol [ketorolac tromethamine], Macrobid [nitrofurantoin monohyd macro], Meclizine hcl, Nitrofurantoin, and Sulfa antibiotics  Review of Systems   Review of Systems  Constitutional: Positive for activity change and diaphoresis.  Cardiovascular: Positive for chest pain.  Gastrointestinal: Negative for vomiting.  Neurological: Negative for dizziness.  All other systems reviewed and are negative.   Physical Exam Updated Vital Signs BP 134/90   Pulse 85   Temp 98.1 F (36.7 C) (Oral)   Resp 20   SpO2 98%   Physical Exam Vitals and nursing note reviewed.  Constitutional:      Appearance: She is well-developed.  HENT:     Head: Normocephalic and atraumatic.  Neck:     Comments: No midline c-spine tenderness Cardiovascular:     Rate and Rhythm: Normal rate.     Heart sounds: Normal heart sounds.  Pulmonary:     Effort: Pulmonary effort is normal.     Breath sounds: Normal breath sounds.  Abdominal:     General: Bowel sounds are normal. There is no distension.     Palpations: Abdomen is soft.  Musculoskeletal:     Cervical back: Normal range of motion and neck supple.     Comments: No long bone tenderness - upper and lower extrmeities and no pelvic pain, instability.  Skin:    General: Skin is warm and dry.  Neurological:     Mental Status: She is alert and oriented to person, place, and time.     ED Results / Procedures / Treatments     Labs (all labs ordered are listed, but only abnormal results are displayed) Labs Reviewed  D-DIMER, QUANTITATIVE (NOT AT [facility])  TROPONIN I (HIGH SENSITIVITY)  TROPONIN I (HIGH SENSITIVITY)    EKG None ED ECG REPORT   Date: 03/27/2020  Rate: 95  Rhythm: normal sinus rhythm  QRS Axis: normal  Intervals: normal  ST/T Wave abnormalities: nonspecific ST changes  Conduction Disutrbances:none  Narrative Interpretation:   Old EKG Reviewed: unchanged  I have personally reviewed the EKG tracing and agree with the computerized printout as noted.   Radiology DG Chest 2 View  Result Date: 03/26/2020 CLINICAL DATA:  Chest pain x1 day. EXAM: CHEST - 2 VIEW COMPARISON:  September 30, 2019 FINDINGS: The heart size and mediastinal contours are within normal limits. Both lungs are clear. Radiopaque surgical clips are seen within the right upper quadrant. The visualized skeletal structures are unremarkable. IMPRESSION: No active cardiopulmonary disease. Electronically Signed   By: Virgina Norfolk M.D.   On: 03/26/2020 18:56    Procedures Procedures (including critical care time)  Medications Ordered in ED Medications - No data to display  ED Course  I have reviewed the triage vital signs and the nursing notes.  Pertinent labs & imaging results that were available during my care of the patient were reviewed by me and considered in my medical decision making (see chart for details).    MDM Rules/Calculators/A&P                          39 year old female comes in a chief complaint of chest pain.  Chest pain is central in nature and she had some associated diaphoresis.  Her EKG is showing some nonspecific ST changes.  Delta  troponin ordered between [facility] and here are negative.  The pain is pleuritic and she had COVID-19 earlier this summer.  EKG does have some LA strain.  D-dimer ordered because of low Wells score and it is negative.  I do not think a CT scan is needed.  We will discharge  her with PCP follow-up.  She states that her PCP also wants her to see a cardiologist, therefore we will allow her to see the cardiologist through her PCPs referral.  Stable for discharge.   Final Clinical Impression(s) / ED Diagnoses Final diagnoses:  Pleuritic chest pain    Rx / DC Orders ED Discharge Orders    None       Varney Biles, MD 03/27/20 [phone]

## 2020-03-27 NOTE — ED Notes (Signed)
Pt. Placed on cardiac monitor in room.

## 2020-03-27 NOTE — ED Triage Notes (Signed)
Pt was seen earlier today for chest pain. Pt had an abnormal EKG and sent to [facility]. While there pt had blood work and a chest x-ray. Pt waited for several hours and left. Pt eventually came here. Chest pain began around 1p on 03/26/20. Pt also currently is being treated for pneumonia. Pt was also concerned because her discharge summary stated that she was pregnant, and spotting.

## 2020-03-27 NOTE — Discharge Instructions (Signed)
We saw you in the ER for chest pain. All the results in the ER are normal, labs and imaging. Your symptoms could be because of chest wall pain.  The weakness, shortness of breath could be because of Covid  The workup in the ER is not complete, and is limited to screening for life threatening and emergent conditions only, so please see a primary care doctor for further evaluation.

## 2020-05-01 ENCOUNTER — Emergency Department (HOSPITAL_BASED_OUTPATIENT_CLINIC_OR_DEPARTMENT_OTHER): Payer: Medicaid Other

## 2020-05-01 ENCOUNTER — Emergency Department (HOSPITAL_BASED_OUTPATIENT_CLINIC_OR_DEPARTMENT_OTHER)
Admission: EM | Admit: 2020-05-01 | Discharge: 2020-05-01 | Disposition: A | Discharge status: Home / Self Care | Payer: Medicaid Other | Attending: Emergency Medicine | Admitting: Emergency Medicine

## 2020-05-01 ENCOUNTER — Other Ambulatory Visit: Payer: Self-pay

## 2020-05-01 ENCOUNTER — Encounter (HOSPITAL_BASED_OUTPATIENT_CLINIC_OR_DEPARTMENT_OTHER): Payer: Self-pay | Admitting: Emergency Medicine

## 2020-05-01 DIAGNOSIS — Z8719 Personal history of other diseases of the digestive system: Secondary | ICD-10-CM | POA: Diagnosis not present

## 2020-05-01 DIAGNOSIS — R1084 Generalized abdominal pain: Secondary | ICD-10-CM | POA: Diagnosis present

## 2020-05-01 DIAGNOSIS — F17200 Nicotine dependence, unspecified, uncomplicated: Secondary | ICD-10-CM | POA: Diagnosis not present

## 2020-05-01 DIAGNOSIS — Z87442 Personal history of urinary calculi: Secondary | ICD-10-CM | POA: Diagnosis not present

## 2020-05-01 DIAGNOSIS — Z79899 Other long term (current) drug therapy: Secondary | ICD-10-CM | POA: Diagnosis not present

## 2020-05-01 DIAGNOSIS — K219 Gastro-esophageal reflux disease without esophagitis: Secondary | ICD-10-CM | POA: Insufficient documentation

## 2020-05-01 DIAGNOSIS — R197 Diarrhea, unspecified: Secondary | ICD-10-CM | POA: Diagnosis not present

## 2020-05-01 DIAGNOSIS — I10 Essential (primary) hypertension: Secondary | ICD-10-CM | POA: Diagnosis not present

## 2020-05-01 LAB — COMPREHENSIVE METABOLIC PANEL
ALT: 50 U/L — ABNORMAL HIGH (ref 0–44)
AST: 28 U/L (ref 15–41)
Albumin: 3.9 g/dL (ref 3.5–5.0)
Alkaline Phosphatase: 52 U/L (ref 38–126)
Anion gap: 8 (ref 5–15)
BUN: 10 mg/dL (ref 6–20)
CO2: 26 mmol/L (ref 22–32)
Calcium: 8.7 mg/dL — ABNORMAL LOW (ref 8.9–10.3)
Chloride: 103 mmol/L (ref 98–111)
Creatinine, Ser: 0.76 mg/dL (ref 0.44–1.00)
GFR, Estimated: >60 mL/min (ref >60)
Glucose, Bld: 99 mg/dL (ref 70–99)
Potassium: 3.5 mmol/L (ref 3.5–5.1)
Sodium: 137 mmol/L (ref 135–145)
Total Bilirubin: 0.6 mg/dL (ref 0.3–1.2)
Total Protein: 6.9 g/dL (ref 6.5–8.1)

## 2020-05-01 LAB — URINALYSIS, MICROSCOPIC (REFLEX): RBC / HPF: NONE SEEN RBC/hpf (ref 0–5)

## 2020-05-01 LAB — CBC WITH DIFFERENTIAL/PLATELET
Abs Immature Granulocytes: 0.04 10*3/uL (ref 0.00–0.07)
Basophils Absolute: 0.1 10*3/uL (ref 0.0–0.1)
Basophils Relative: 1 %
Eosinophils Absolute: 0.4 10*3/uL (ref 0.0–0.5)
Eosinophils Relative: 5 %
HCT: 40.5 % (ref 36.0–46.0)
Hemoglobin: 13.3 g/dL (ref 12.0–15.0)
Immature Granulocytes: 0 %
Lymphocytes Relative: 17 %
Lymphs Abs: 1.6 10*3/uL (ref 0.7–4.0)
MCH: 30.1 pg (ref 26.0–34.0)
MCHC: 32.8 g/dL (ref 30.0–36.0)
MCV: 91.6 fL (ref 80.0–100.0)
Monocytes Absolute: 0.9 10*3/uL (ref 0.1–1.0)
Monocytes Relative: 10 %
Neutro Abs: 6.2 10*3/uL (ref 1.7–7.7)
Neutrophils Relative %: 67 %
Platelets: 305 10*3/uL (ref 150–400)
RBC: 4.42 MIL/uL (ref 3.87–5.11)
RDW: 13.6 % (ref 11.5–15.5)
WBC: 9.3 10*3/uL (ref 4.0–10.5)
nRBC: 0 % (ref 0.0–0.2)

## 2020-05-01 LAB — URINALYSIS, ROUTINE W REFLEX MICROSCOPIC
Bilirubin Urine: NEGATIVE
Glucose, UA: NEGATIVE mg/dL
Hgb urine dipstick: NEGATIVE
Ketones, ur: NEGATIVE mg/dL
Nitrite: NEGATIVE
Protein, ur: NEGATIVE mg/dL
Specific Gravity, Urine: 1.02 (ref 1.005–1.030)
pH: 6 (ref 5.0–8.0)

## 2020-05-01 LAB — C DIFFICILE QUICK SCREEN W PCR REFLEX
C Diff antigen: NEGATIVE
C Diff interpretation: NOT DETECTED
C Diff toxin: NEGATIVE

## 2020-05-01 LAB — PREGNANCY, URINE: Preg Test, Ur: NEGATIVE

## 2020-05-01 LAB — LIPASE, BLOOD: Lipase: 35 U/L (ref 11–51)

## 2020-05-01 MED ORDER — IOHEXOL 300 MG/ML  SOLN
100.0000 mL | Freq: Once | INTRAMUSCULAR | Status: AC | PRN
  Administered 2020-05-01: 100 mL via INTRAVENOUS

## 2020-05-01 MED ORDER — FENTANYL CITRATE (PF) 100 MCG/2ML IJ SOLN
50.0000 ug | Freq: Once | INTRAMUSCULAR | Status: AC
  Administered 2020-05-01: 50 ug via INTRAVENOUS
  Filled 2020-05-01: qty 2

## 2020-05-01 NOTE — ED Triage Notes (Signed)
Reports gl abd pain radiating to left UQ. Sees GI , sent here for CT, diarrhea x 3 weeks.  Recent lan work, high WBC.

## 2020-05-01 NOTE — ED Notes (Signed)
Patient transported to CT 

## 2020-05-01 NOTE — ED Provider Notes (Signed)
[facility] Provider Note   CSN: [id number] Arrival date & time: 05/01/20  1501     History Chief Complaint  Patient presents with  . Abdominal Pain    Trena A Hilligoss is a 39 y.o. female.  Patient is a 39 year old female who presents with abdominal pain and diarrhea.  She reports that she has had a 2-week history of loose stools.  She said they becoming more frequent and are watery at times.  At times it is just soft brown stools.  Sometimes they are green.  Over the last few days she has had some worsening abdominal pain.  She feels like it is more left-sided.  She denies any blood in her stool.  No vomiting.  She has seen a gastroenterologist at [facility] and was started on Flagyl and had some lab work a couple weeks ago when it started.  Her symptoms have gotten worse and now she is having so she abdominal pain.  She was sent here to have a possible CT scan.  She denies any recent travel.  No camping.  Her last antibiotic usage was for pneumonia in September.  She says she has had history of some bowel issues in the past but not this bad.  No known fevers.        Past Medical History:  Diagnosis Date  . Abdominal pain    "right lower quadrant pain and right lower back pain  . Anxiety   . Deviated septum 09/2011  . GERD (gastroesophageal reflux disease)   . Headache(784.0)    sinus  . History of echocardiogram    Echo 11/17: EF 55-60, no RWMA, normal diastolic function  . Hypertension    no meds  . Kidney stones    no current problem  . Migraine   . Nasal turbinate hypertrophy 09/2011   bilat.  . Pelvic inflammatory disease   . Urticaria     Patient Active Problem List   Diagnosis Date Noted  . Gallstone 01/07/2016  . History of colonic polyps 11/27/2015  . Right lower quadrant abdominal pain 11/27/2015  . HTN (hypertension), benign 11/24/2015  . Abdominal pain 11/16/2015  . Headache 10/13/2015  . Thyromegaly  11/03/2014  . Anxiety disorder 11/03/2014    Past Surgical History:  Procedure Laterality Date  . CHOLECYSTECTOMY N/A 01/15/2016   Procedure: LAPAROSCOPIC CHOLECYSTECTOMY WITH INTRAOPERATIVE CHOLANGIOGRAM;  Surgeon: Christene Lye, MD;  Location: [facility] ORS;  Service: General;  Laterality: N/A;  . COLONOSCOPY  2007   [facility]: pedunculated 80mm polyp in distal sigmoid, sessile 10mm polyp at splenic flexure, larger polyp tubulovillous adenoma and smaller polyp tubular adenoma   . COLONOSCOPY  09-09-15  . COLONOSCOPY WITH PROPOFOL N/A 03/01/2016   Procedure: COLONOSCOPY WITH PROPOFOL;  Surgeon: Garlan Fair, MD;  Location: [facility];  Service: Endoscopy;  Laterality: N/A;  . NASAL SEPTOPLASTY W/ TURBINOPLASTY  10/04/2011   Procedure: NASAL SEPTOPLASTY WITH TURBINATE REDUCTION;  Surgeon: Ascencion Dike, MD;  Location: [facility];  Service: ENT;  Laterality: Bilateral;  . WISDOM TOOTH EXTRACTION  2009     OB History    Gravida  5   Para  4   Term  4   Preterm      AB  1   Living  4     SAB  1   TAB      Ectopic      Multiple      Live Births  4        Obstetric Comments  1st Menstrual Cycle:  10  1st Pregnancy:  16         Family History  Problem Relation Age of Onset  . Kidney cancer Mother   . Hypertension Mother   . Migraines Mother   . Multiple sclerosis Mother   . Fibromyalgia Mother   . Liver disease Mother        NASH   . Hypertension Father   . Liver disease Father        liver transplant; had fatty liver disease  . Spina bifida Brother   . Migraines Maternal Grandmother   . Hypertension Other   . Colon cancer Paternal Grandfather   . Allergic rhinitis Daughter   . Asthma Daughter   . Allergic rhinitis Son   . Asthma Son   . Angioedema Neg Hx   . Eczema Neg Hx   . Immunodeficiency Neg Hx   . Urticaria Neg Hx     Social History   Tobacco Use  . Smoking status: Current Every Day Smoker    Packs/day: 0.00    Years:  10.00    Pack years: 0.00    Last attempt to quit: 08/23/2008    Years since quitting: 11.6  . Smokeless tobacco: Never Used  Vaping Use  . Vaping Use: Never used  Substance Use Topics  . Alcohol use: Yes    Alcohol/week: 0.0 standard drinks    Comment: ocassionally  . Drug use: No    Home Medications Prior to Admission medications   Medication Sig Start Date End Date Taking? Authorizing Provider  acetaminophen (TYLENOL) 500 MG tablet Take 500-1,000 mg by mouth every 6 (six) hours as needed for mild pain.     [provider]  albuterol (VENTOLIN HFA) 108 (90 Base) MCG/ACT inhaler Inhale 2 puffs into the lungs every 6 (six) hours as needed for wheezing or shortness of breath.    [provider]  clonazePAM (KLONOPIN) 0.5 MG tablet Take 0.5 mg by mouth 2 (two) times daily as needed for anxiety.     [provider]  cyclobenzaprine (FLEXERIL) 10 MG tablet Take 1 tablet (10 mg total) by mouth 2 (two) times daily as needed for muscle spasms. Patient not taking: Reported on 03/27/2020 12/15/19   Loura Halt A, NP  ibuprofen (ADVIL) 200 MG tablet Take 200 mg by mouth every 6 (six) hours as needed for moderate pain.     [provider]  levofloxacin (LEVAQUIN) 250 MG tablet Take 250 mg by mouth daily.    [provider]  loratadine (CLARITIN) 10 MG tablet Take 10 mg by mouth daily.    [provider]  montelukast (SINGULAIR) 10 MG tablet Take 10 mg by mouth at bedtime.    [provider]  omeprazole (PRILOSEC) 40 MG capsule Take 40 mg by mouth daily.     [provider]  potassium chloride (KLOR-CON) 10 MEQ tablet Take 10 mEq by mouth daily.     [provider]  potassium chloride (KLOR-CON) 10 MEQ tablet Take 1 tablet (10 mEq total) by mouth 2 (two) times daily for 3 days. Patient not taking: Reported on 03/27/2020 03/01/20 03/27/20  Fredia Sorrow, MD  Prenat-Fe Poly-Methfol-FA-DHA (VITAFOL ULTRA) 29-0.6-0.4-200 MG  CAPS Take 1 capsule by mouth daily before breakfast. 11/01/18   Shelly Bombard, MD  sertraline (ZOLOFT) 25 MG tablet Take 25 mg by mouth daily.    [provider]  Vitamin D, Ergocalciferol, (DRISDOL) 1.25 MG (50000 UNIT) CAPS capsule Take 50,000 Units by mouth See admin instructions. Twice weekly    [provider]  escitalopram (LEXAPRO) 10 MG tablet Take 10 mg by mouth daily.    09/02/11  [provider]    Allergies    Clarithromycin, Doxycycline, Dicyclomine, Sulfasalazine, Bentyl [dicyclomine hcl], Haldol [haloperidol], Meclizine, Morphine and related, Toradol [ketorolac tromethamine], Macrobid [nitrofurantoin monohyd macro], Meclizine hcl, Nitrofurantoin, and Sulfa antibiotics  Review of Systems   Review of Systems  Constitutional: Negative for chills, diaphoresis, fatigue and fever.  HENT: Negative for congestion, rhinorrhea and sneezing.   Eyes: Negative.   Respiratory: Negative for cough, chest tightness and shortness of breath.   Cardiovascular: Negative for chest pain and leg swelling.  Gastrointestinal: Positive for abdominal pain and diarrhea. Negative for blood in stool, nausea and vomiting.  Genitourinary: Negative for difficulty urinating, flank pain, frequency and hematuria.  Musculoskeletal: Negative for arthralgias and back pain.  Skin: Negative for rash.  Neurological: Negative for dizziness, speech difficulty, weakness, numbness and headaches.    Physical Exam Updated Vital Signs BP (!) 148/88 (BP Location: Left Arm)   Pulse 80   Temp 98.1 F (36.7 C) (Oral)   Resp 20   LMP 04/21/2020   SpO2 99%   Physical Exam Constitutional:      Appearance: She is well-developed.  HENT:     Head: Normocephalic and atraumatic.  Eyes:     Pupils: Pupils are equal, round, and reactive to light.  Cardiovascular:     Rate and Rhythm: Normal rate and regular rhythm.     Heart sounds: Normal heart sounds.  Pulmonary:     Effort: Pulmonary effort  is normal. No respiratory distress.     Breath sounds: Normal breath sounds. No wheezing or rales.  Chest:     Chest wall: No tenderness.  Abdominal:     General: Bowel sounds are normal.     Palpations: Abdomen is soft.     Tenderness: There is abdominal tenderness in the periumbilical area, left upper quadrant and left lower quadrant. There is no guarding or rebound.  Musculoskeletal:        General: Normal range of motion.     Cervical back: Normal range of motion and neck supple.  Lymphadenopathy:     Cervical: No cervical adenopathy.  Skin:    General: Skin is warm and dry.     Findings: No rash.  Neurological:     Mental Status: She is alert and oriented to person, place, and time.     ED Results / Procedures / Treatments   Labs (all labs ordered are listed, but only abnormal results are displayed) Labs Reviewed  COMPREHENSIVE METABOLIC PANEL - Abnormal; Notable for the following components:      Result Value   Calcium 8.7 (*)    ALT 50 (*)    All other components within normal limits  URINALYSIS, ROUTINE W REFLEX MICROSCOPIC - Abnormal; Notable for the following components:   APPearance CLOUDY (*)    Leukocytes,Ua TRACE (*)    All other components within normal limits  URINALYSIS, MICROSCOPIC (REFLEX) - Abnormal; Notable for the following components:   Bacteria, UA MANY (*)    All other components within normal limits  GASTROINTESTINAL PANEL BY PCR, STOOL (REPLACES STOOL CULTURE)  C DIFFICILE QUICK SCREEN W PCR REFLEX  LIPASE, BLOOD  CBC WITH DIFFERENTIAL/PLATELET  PREGNANCY, URINE    EKG None  Radiology CT Abdomen Pelvis W  Contrast  Result Date: 05/01/2020 CLINICAL DATA:  Lower abdominal pain for several days EXAM: CT ABDOMEN AND PELVIS WITH CONTRAST TECHNIQUE: Multidetector CT imaging of the abdomen and pelvis was performed using the standard protocol following bolus administration of intravenous contrast. CONTRAST:  140mL OMNIPAQUE IOHEXOL 300 MG/ML  SOLN  COMPARISON:  05/14/2018 FINDINGS: Lower chest: No acute abnormality. Hepatobiliary: Fatty infiltration of the liver is noted. The gallbladder has been surgically removed. Pancreas: Unremarkable. No pancreatic ductal dilatation or surrounding inflammatory changes. Spleen: Normal in size without focal abnormality. Adrenals/Urinary Tract: Adrenal glands are within normal limits. Kidneys demonstrate a normal enhancement pattern bilaterally. No renal calculi or obstructive changes noted. Delayed images demonstrate normal excretion of contrast. Bladder is partially distended. A small amount of air is noted within the bladder likely related to recent instrumentation. Stomach/Bowel: Scattered diverticular change of the colon is noted without evidence of diverticulitis. No obstructive or inflammatory changes are seen. The appendix is within normal limits. Small bowel shows no focal abnormality. The stomach is within normal limits. Vascular/Lymphatic: No significant vascular findings are present. No enlarged abdominal or pelvic lymph nodes. Reproductive: Uterus is within normal limits. 1.5 cm cyst is noted within the left ovary. Right ovary appears within normal limits. Other: No abdominal wall hernia or abnormality. No abdominopelvic ascites. Musculoskeletal: No acute or significant osseous findings. IMPRESSION: No acute abnormality noted. Mild diverticular change. Fatty liver. Electronically Signed   By: Inez Catalina M.D.   On: 05/01/2020 17:53    Procedures Procedures (including critical care time)  Medications Ordered in ED Medications  fentaNYL (SUBLIMAZE) injection 50 mcg (50 mcg Intravenous Given 05/01/20 1626)  iohexol (OMNIPAQUE) 300 MG/ML solution 100 mL (100 mLs Intravenous Contrast Given 05/01/20 1720)    ED Course  I have reviewed the triage vital signs and the nursing notes.  Pertinent labs & imaging results that were available during my care of the patient were reviewed by me and considered in my  medical decision making (see chart for details).    MDM Rules/Calculators/A&P                          Patient is a 39 year old female who presents with generalized abdominal pain associated with diarrhea.  CT scan shows no acute abnormality.  There is some diverticula but no evidence of diverticulitis.  Her labs are nonconcerning.  Her ALT is minimally elevated.  Her urine appears to be more of a contaminated specimen.  She is not having specific urinary symptoms.  Her pregnancy test is negative.  Her abdominal exam is grossly benign.  She was discharged home in good condition.  She was encouraged to follow back up with her gastroenterologist.  I was going to start her on Bentyl but she is allergic to this.  Her stool was sent for C. difficile and stool studies.  These are pending. Final Clinical Impression(s) / ED Diagnoses Final diagnoses:  Generalized abdominal pain  Diarrhea, unspecified type    Rx / DC Orders ED Discharge Orders    None       Malvin Johns, MD 05/01/20 1859

## 2020-05-01 NOTE — Discharge Instructions (Signed)
Follow-up with your gastroenterologist.  Maintain a bland diet only.  Make sure that you're staying hydrated.  Return here as needed if you have any worsening symptoms.

## 2020-05-02 LAB — GASTROINTESTINAL PANEL BY PCR, STOOL (REPLACES STOOL CULTURE)

## 2020-05-21 ENCOUNTER — Other Ambulatory Visit: Payer: Self-pay

## 2020-05-21 ENCOUNTER — Encounter (HOSPITAL_BASED_OUTPATIENT_CLINIC_OR_DEPARTMENT_OTHER): Payer: Self-pay | Admitting: *Deleted

## 2020-05-21 ENCOUNTER — Emergency Department (HOSPITAL_BASED_OUTPATIENT_CLINIC_OR_DEPARTMENT_OTHER)
Admission: EM | Admit: 2020-05-21 | Discharge: 2020-05-21 | Disposition: A | Discharge status: Home / Self Care | Payer: Medicaid Other | Attending: Emergency Medicine | Admitting: Emergency Medicine

## 2020-05-21 DIAGNOSIS — Z79899 Other long term (current) drug therapy: Secondary | ICD-10-CM | POA: Diagnosis not present

## 2020-05-21 DIAGNOSIS — K1370 Unspecified lesions of oral mucosa: Secondary | ICD-10-CM | POA: Diagnosis present

## 2020-05-21 DIAGNOSIS — B001 Herpesviral vesicular dermatitis: Secondary | ICD-10-CM | POA: Insufficient documentation

## 2020-05-21 DIAGNOSIS — I10 Essential (primary) hypertension: Secondary | ICD-10-CM | POA: Insufficient documentation

## 2020-05-21 DIAGNOSIS — B370 Candidal stomatitis: Secondary | ICD-10-CM | POA: Insufficient documentation

## 2020-05-21 DIAGNOSIS — F17200 Nicotine dependence, unspecified, uncomplicated: Secondary | ICD-10-CM | POA: Diagnosis not present

## 2020-05-21 MED ORDER — NYSTATIN 100000 UNIT/ML MT SUSP
500000.0000 [IU] | Freq: Four times a day (QID) | OROMUCOSAL | 0 refills | Status: DC
Start: 2020-05-21 — End: 2020-06-02

## 2020-05-21 NOTE — ED Provider Notes (Signed)
[facility] EMERGENCY DEPARTMENT Provider Note   CSN: [id number] Arrival date & time: 05/21/20  1605     History Chief Complaint  Patient presents with  . Mouth Lesions    Christina Fox is a 39 y.o. female.  39 year old female with past medical history below who presents with mouth sores.  Patient has been on an antibiotic and oral steroids as well as albuterol for recent bronchitis.  Her PCP told her that the medications could cause thrush.  She noticed some white lesions on the inside of her lower lip today.  She also noticed a small sore on the outside of her upper lip.  She reports some mild soreness and hoarseness in her throat.  No trouble swallowing, no breathing problems.  The history is provided by the patient.  Mouth Lesions      Past Medical History:  Diagnosis Date  . Abdominal pain    "right lower quadrant pain and right lower back pain  . Anxiety   . Deviated septum 09/2011  . GERD (gastroesophageal reflux disease)   . Headache(784.0)    sinus  . History of echocardiogram    Echo 11/17: EF 55-60, no RWMA, normal diastolic function  . Hypertension    no meds  . Kidney stones    no current problem  . Migraine   . Nasal turbinate hypertrophy 09/2011   bilat.  . Pelvic inflammatory disease   . Urticaria     Patient Active Problem List   Diagnosis Date Noted  . Gallstone 01/07/2016  . History of colonic polyps 11/27/2015  . Right lower quadrant abdominal pain 11/27/2015  . HTN (hypertension), benign 11/24/2015  . Abdominal pain 11/16/2015  . Headache 10/13/2015  . 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 Anxiety disorder 11/03/2014    Past Surgical History:  Procedure Laterality Date  . CHOLECYSTECTOMY N/A 01/15/2016   Procedure: LAPAROSCOPIC CHOLECYSTECTOMY WITH INTRAOPERATIVE CHOLANGIOGRAM;  Surgeon: Christene Lye, MD;  Location: [facility];  Service: General;  Laterality: N/A;  . COLONOSCOPY  2007   [facility]: pedunculated 27mm polyp in  distal sigmoid, sessile 62mm polyp at splenic flexure, larger polyp tubulovillous adenoma and smaller polyp tubular adenoma   . COLONOSCOPY  09-09-15  . COLONOSCOPY WITH PROPOFOL N/A 03/01/2016   Procedure: COLONOSCOPY WITH PROPOFOL;  Surgeon: Garlan Fair, MD;  Location: [facility];  Service: Endoscopy;  Laterality: N/A;  . NASAL SEPTOPLASTY W/ TURBINOPLASTY  10/04/2011   Procedure: NASAL SEPTOPLASTY WITH TURBINATE REDUCTION;  Surgeon: Ascencion Dike, MD;  Location: [facility];  Service: ENT;  Laterality: Bilateral;  . WISDOM TOOTH EXTRACTION  2009     OB History    Gravida  5   Para  4   Term  4   Preterm      AB  1   Living  4     SAB  1   TAB      Ectopic      Multiple      Live Births  4        Obstetric Comments  1st Menstrual Cycle:  10  1st Pregnancy:  60         Family History  Problem Relation Age of Onset  . Kidney cancer Mother   . Hypertension Mother   . Migraines Mother   . Multiple sclerosis Mother   . Fibromyalgia Mother   . Liver disease Mother        NASH   . Hypertension  Father   . Liver disease Father        liver transplant; had fatty liver disease  . Spina bifida Brother   . Migraines Maternal Grandmother   . Hypertension Other   . Colon cancer Paternal Grandfather   . Allergic rhinitis Daughter   . Asthma Daughter   . Allergic rhinitis Son   . Asthma Son   . Angioedema Neg Hx   . Eczema Neg Hx   . Immunodeficiency Neg Hx   . Urticaria Neg Hx     Social History   Tobacco Use  . Smoking status: Current Every Day Smoker    Packs/day: 0.00    Years: 10.00    Pack years: 0.00    Last attempt to quit: 08/23/2008    Years since quitting: 11.7  . Smokeless tobacco: Never Used  Vaping Use  . Vaping Use: Never used  Substance Use Topics  . Alcohol use: Yes    Alcohol/week: 0.0 standard drinks    Comment: ocassionally  . Drug use: No    Home Medications Prior to Admission medications   Medication Sig Start  Date End Date Taking? Authorizing Provider  acetaminophen (TYLENOL) 500 MG tablet Take 500-1,000 mg by mouth every 6 (six) hours as needed for mild pain.     [provider]  albuterol (VENTOLIN HFA) 108 (90 Base) MCG/ACT inhaler Inhale 2 puffs into the lungs every 6 (six) hours as needed for wheezing or shortness of breath.    [provider]  clonazePAM (KLONOPIN) 0.5 MG tablet Take 0.5 mg by mouth 2 (two) times daily as needed for anxiety.     [provider]  cyclobenzaprine (FLEXERIL) 10 MG tablet Take 1 tablet (10 mg total) by mouth 2 (two) times daily as needed for muscle spasms. Patient not taking: Reported on 03/27/2020 12/15/19   Loura Halt A, NP  ibuprofen (ADVIL) 200 MG tablet Take 200 mg by mouth every 6 (six) hours as needed for moderate pain.     [provider]  levofloxacin (LEVAQUIN) 250 MG tablet Take 250 mg by mouth daily.    [provider]  loratadine (CLARITIN) 10 MG tablet Take 10 mg by mouth daily.    [provider]  montelukast (SINGULAIR) 10 MG tablet Take 10 mg by mouth at bedtime.    [provider]  nystatin (MYCOSTATIN) 100000 UNIT/ML suspension Take 5 mLs (500,000 Units total) by mouth 4 (four) times daily. Swish in mouth and swallow for up to 7 days until thrush resolves 05/21/20   Kiyara Bouffard, Wenda Overland, MD  omeprazole (PRILOSEC) 40 MG capsule Take 40 mg by mouth daily.     [provider]  potassium chloride (KLOR-CON) 10 MEQ tablet Take 10 mEq by mouth daily.     [provider]  potassium chloride (KLOR-CON) 10 MEQ tablet Take 1 tablet (10 mEq total) by mouth 2 (two) times daily for 3 days. Patient not taking: Reported on 03/27/2020 03/01/20 03/27/20  Fredia Sorrow, MD  Prenat-Fe Poly-Methfol-FA-DHA (VITAFOL ULTRA) 29-0.6-0.4-200 MG CAPS Take 1 capsule by mouth daily before breakfast. 11/01/18   Shelly Bombard, MD  sertraline (ZOLOFT) 25 MG tablet Take 25 mg by mouth daily.     [provider]  Vitamin D, Ergocalciferol, (DRISDOL) 1.25 MG (50000 UNIT) CAPS capsule Take 50,000 Units by mouth See admin instructions. Twice weekly    [provider]  escitalopram (LEXAPRO) 10 MG tablet Take 10 mg by mouth daily.    09/02/11  [provider]    Allergies    Clarithromycin, Doxycycline, Dicyclomine, Sulfasalazine, Bentyl [dicyclomine hcl], Haldol [haloperidol], Meclizine, Morphine and related, Toradol [ketorolac tromethamine], Macrobid [nitrofurantoin monohyd macro], Meclizine hcl, Nitrofurantoin, and Sulfa antibiotics  Review of Systems   Review of Systems  HENT: Positive for mouth sores.    All other systems reviewed and are negative except that which was mentioned in HPI  Physical Exam Updated Vital Signs BP (!) 150/92   Pulse 81   Temp 98.1 F (36.7 C) (Oral)   Resp 16   Ht 5\' 3"  (1.6 m)   Wt 93.9 kg   LMP 05/17/2020   SpO2 99%   BMI 36.67 kg/m   Physical Exam Vitals and nursing note reviewed.  Constitutional:      General: She is not in acute distress.    Appearance: She is well-developed.  HENT:     Head: Normocephalic and atraumatic.     Nose: Nose normal.     Mouth/Throat:     Mouth: Mucous membranes are moist.     Comments: Tiny ulcerative lesion on L upper lip near vermillion border; small white scattered plaques on inside of lower lip; superficial ulcerations and erythema on soft palate, uvula midline, no tonsillar enlargement, no exudates Eyes:     Conjunctiva/sclera: Conjunctivae normal.  Musculoskeletal:     Cervical back: Neck supple.  Skin:    General: Skin is warm and dry.  Neurological:     Mental Status: She is alert and oriented to person, place, and time.  Psychiatric:        Judgment: Judgment normal.     ED Results / Procedures / Treatments   Labs (all labs ordered are listed, but only abnormal results are displayed) Labs Reviewed - No data to display  EKG None  Radiology No results  found.  Procedures Procedures (including critical care time)  Medications Ordered in ED Medications - No data to display  ED Course  I have reviewed the triage vital signs and the nursing notes.      MDM Rules/Calculators/A&P                          Pt w/ oral candidiasis on inner lower lip; appearance of upper lip lesion is c/w herpes labialis. Posterior oropharynx may be from concurrent viral illness, doesn't actually look like thrush. Recommended nystatin swish and swallow, discussed OTC topical medications for HSV lip lesion.  Final Clinical Impression(s) / ED Diagnoses Final diagnoses:  Oral candidiasis  Herpes labialis    Rx / DC Orders ED Discharge Orders         Ordered    nystatin (MYCOSTATIN) 100000 UNIT/ML suspension  4 times daily        05/21/20 1723           Raimundo Corbit, Wenda Overland, MD 05/21/20 1735

## 2020-05-21 NOTE — ED Triage Notes (Signed)
She has been on an antibiotic for bronchitis. She is here for lesions on the inside of her lower lip. Her MD told her she might get thrush.

## 2020-05-24 ENCOUNTER — Telehealth: Payer: Medicaid Other | Admitting: Physician Assistant

## 2020-05-24 DIAGNOSIS — B373 Candidiasis of vulva and vagina: Secondary | ICD-10-CM | POA: Diagnosis not present

## 2020-05-24 DIAGNOSIS — B3731 Acute candidiasis of vulva and vagina: Secondary | ICD-10-CM

## 2020-05-24 MED ORDER — FLUCONAZOLE 150 MG PO TABS: 150.0000 mg | ORAL_TABLET | Freq: Once | ORAL | 0 refills | Status: AC

## 2020-05-24 NOTE — Progress Notes (Signed)
We are sorry that you are not feeling well. Here is how we plan to help! Based on what you shared with me it looks like you: May have a yeast vaginosis  Vaginosis is an inflammation of the vagina that can result in discharge, itching and pain. The cause is usually a change in the normal balance of vaginal bacteria or an infection. Vaginosis can also result from reduced estrogen levels after menopause.  The most common causes of vaginosis are:   Bacterial vaginosis which results from an overgrowth of one on several organisms that are normally present in your vagina.   Yeast infections which are caused by a naturally occurring fungus called candida.   Vaginal atrophy (atrophic vaginosis) which results from the thinning of the vagina from reduced estrogen levels after menopause.   Trichomoniasis which is caused by a parasite and is commonly transmitted by sexual intercourse.  Factors that increase your risk of developing vaginosis include: . Medications, such as antibiotics and steroids . Uncontrolled diabetes . Use of hygiene products such as bubble bath, vaginal spray or vaginal deodorant . Douching . Wearing damp or tight-fitting clothing . Using an intrauterine device (IUD) for birth control . Hormonal changes, such as those associated with pregnancy, birth control pills or menopause . Sexual activity . Having a sexually transmitted infection  Your treatment plan is A single Diflucan (fluconazole) 150mg tablet once.  I have electronically sent this prescription into the pharmacy that you have chosen.  Be sure to take all of the medication as directed. Stop taking any medication if you develop a rash, tongue swelling or shortness of breath. Mothers who are breast feeding should consider pumping and discarding their breast milk while on these antibiotics. However, there is no consensus that infant exposure at these doses would be harmful.  Remember that medication creams can weaken latex  condoms. .   HOME CARE:  Good hygiene may prevent some types of vaginosis from recurring and may relieve some symptoms:  . Avoid baths, hot tubs and whirlpool spas. Rinse soap from your outer genital area after a shower, and dry the area well to prevent irritation. Don't use scented or harsh soaps, such as those with deodorant or antibacterial action. . Avoid irritants. These include scented tampons and pads. . Wipe from front to back after using the toilet. Doing so avoids spreading fecal bacteria to your vagina.  Other things that may help prevent vaginosis include:  . Don't douche. Your vagina doesn't require cleansing other than normal bathing. Repetitive douching disrupts the normal organisms that reside in the vagina and can actually increase your risk of vaginal infection. Douching won't clear up a vaginal infection. . Use a latex condom. Both female and female latex condoms may help you avoid infections spread by sexual contact. . Wear cotton underwear. Also wear pantyhose with a cotton crotch. If you feel comfortable without it, skip wearing underwear to bed. Yeast thrives in moist environments Your symptoms should improve in the next day or two.  GET HELP RIGHT AWAY IF:  . You have pain in your lower abdomen ( pelvic area or over your ovaries) . You develop nausea or vomiting . You develop a fever . Your discharge changes or worsens . You have persistent pain with intercourse . You develop shortness of breath, a rapid pulse, or you faint.  These symptoms could be signs of problems or infections that need to be evaluated by a medical provider now.  MAKE SURE YOU      Understand these instructions.  Will watch your condition.  Will get help right away if you are not doing well or get worse.  Your e-visit answers were reviewed by a board certified advanced clinical practitioner to complete your personal care plan. Depending upon the condition, your plan could have included  both over the counter or prescription medications. Please review your pharmacy choice to make sure that you have choses a pharmacy that is open for you to pick up any needed prescription, Your safety is important to us. If you have drug allergies check your prescription carefully.   You can use MyChart to ask questions about today's visit, request a non-urgent call back, or ask for a work or school excuse for 24 hours related to this e-Visit. If it has been greater than 24 hours you will need to follow up with your provider, or enter a new e-Visit to address those concerns. You will get a MyChart message within the next two days asking about your experience. I hope that your e-visit has been valuable and will speed your recovery.  I spent 5-10 minutes on review and completion of this note- Adoria Kawamoto PAC  

## 2020-05-27 ENCOUNTER — Emergency Department (HOSPITAL_BASED_OUTPATIENT_CLINIC_OR_DEPARTMENT_OTHER)
Admission: EM | Admit: 2020-05-27 | Discharge: 2020-05-27 | Disposition: A | Discharge status: Home / Self Care | Payer: Medicaid Other | Attending: Emergency Medicine | Admitting: Emergency Medicine

## 2020-05-27 ENCOUNTER — Emergency Department (HOSPITAL_BASED_OUTPATIENT_CLINIC_OR_DEPARTMENT_OTHER): Payer: Medicaid Other

## 2020-05-27 ENCOUNTER — Other Ambulatory Visit: Payer: Self-pay

## 2020-05-27 ENCOUNTER — Encounter (HOSPITAL_BASED_OUTPATIENT_CLINIC_OR_DEPARTMENT_OTHER): Payer: Self-pay

## 2020-05-27 DIAGNOSIS — K219 Gastro-esophageal reflux disease without esophagitis: Secondary | ICD-10-CM | POA: Diagnosis not present

## 2020-05-27 DIAGNOSIS — R079 Chest pain, unspecified: Secondary | ICD-10-CM | POA: Diagnosis present

## 2020-05-27 DIAGNOSIS — R1010 Upper abdominal pain, unspecified: Secondary | ICD-10-CM | POA: Diagnosis not present

## 2020-05-27 DIAGNOSIS — R0789 Other chest pain: Secondary | ICD-10-CM | POA: Diagnosis not present

## 2020-05-27 DIAGNOSIS — I10 Essential (primary) hypertension: Secondary | ICD-10-CM | POA: Diagnosis not present

## 2020-05-27 DIAGNOSIS — F17200 Nicotine dependence, unspecified, uncomplicated: Secondary | ICD-10-CM | POA: Insufficient documentation

## 2020-05-27 LAB — CBC
HCT: 41.5 % (ref 36.0–46.0)
Hemoglobin: 13.8 g/dL (ref 12.0–15.0)
MCH: 30.1 pg (ref 26.0–34.0)
MCHC: 33.3 g/dL (ref 30.0–36.0)
MCV: 90.6 fL (ref 80.0–100.0)
Platelets: 318 10*3/uL (ref 150–400)
RBC: 4.58 MIL/uL (ref 3.87–5.11)
RDW: 13.2 % (ref 11.5–15.5)
WBC: 14.8 10*3/uL — ABNORMAL HIGH (ref 4.0–10.5)
nRBC: 0 % (ref 0.0–0.2)

## 2020-05-27 LAB — HEPATIC FUNCTION PANEL
ALT: 36 U/L (ref 0–44)
AST: 21 U/L (ref 15–41)
Albumin: 3.9 g/dL (ref 3.5–5.0)
Alkaline Phosphatase: 60 U/L (ref 38–126)
Bilirubin, Direct: 0.1 mg/dL (ref 0.0–0.2)
Indirect Bilirubin: 0.5 mg/dL (ref 0.3–0.9)
Total Bilirubin: 0.6 mg/dL (ref 0.3–1.2)
Total Protein: 6.8 g/dL (ref 6.5–8.1)

## 2020-05-27 LAB — LIPASE, BLOOD: Lipase: 34 U/L (ref 11–51)

## 2020-05-27 LAB — BASIC METABOLIC PANEL
Anion gap: 10 (ref 5–15)
BUN: 14 mg/dL (ref 6–20)
CO2: 24 mmol/L (ref 22–32)
Calcium: 8.8 mg/dL — ABNORMAL LOW (ref 8.9–10.3)
Chloride: 100 mmol/L (ref 98–111)
Creatinine, Ser: 0.78 mg/dL (ref 0.44–1.00)
GFR, Estimated: >60 mL/min (ref >60)
Glucose, Bld: 121 mg/dL — ABNORMAL HIGH (ref 70–99)
Potassium: 3.4 mmol/L — ABNORMAL LOW (ref 3.5–5.1)
Sodium: 134 mmol/L — ABNORMAL LOW (ref 135–145)

## 2020-05-27 LAB — TROPONIN I (HIGH SENSITIVITY)
Troponin I (High Sensitivity): 4 ng/L (ref <18)
Troponin I (High Sensitivity): 4 ng/L (ref <18)

## 2020-05-27 LAB — D-DIMER, QUANTITATIVE: D-Dimer, Quant: 0.51 ug/mL-FEU — ABNORMAL HIGH (ref 0.00–0.50)

## 2020-05-27 LAB — PREGNANCY, URINE: Preg Test, Ur: NEGATIVE

## 2020-05-27 MED ORDER — IOHEXOL 350 MG/ML SOLN
100.0000 mL | Freq: Once | INTRAVENOUS | Status: AC | PRN
  Administered 2020-05-27: 100 mL via INTRAVENOUS

## 2020-05-27 NOTE — ED Notes (Signed)
Provider wanted to see pt before obtaining 2nd troponin.

## 2020-05-27 NOTE — ED Triage Notes (Signed)
Pt c/o CP started ~10am-states she was dx dx with bronchitis ~1 week-still taking abx and steroids-NAD-steady gait

## 2020-05-27 NOTE — Discharge Instructions (Signed)
  Work-up here in the emergency department was reassuring. We do recommend follow-up with cardiology and pulmonology for chest complaints. Return to the emergency department for worsening symptoms, persistent shortness of breath, worsening abdominal pain, or any other major concerns.

## 2020-05-27 NOTE — ED Notes (Signed)
Pt on monitor and vitals cycling 

## 2020-05-28 NOTE — ED Provider Notes (Signed)
[facility] EMERGENCY DEPARTMENT Provider Note   CSN: [id number] Arrival date & time: 05/27/20  1328     History Chief Complaint  Patient presents with  . Chest Pain    Christina Fox is a 39 y.o. female.  HPI      Christina Fox is a 39 y.o. female, with a history of HTN, anxiety, GERD, presenting to the ED with chest pain. She states yesterday and today she felt "different than normal."  She has had intermittent chest discomfort and intermittent shortness of breath since Covid in July 2021, but this chest discomfort felt a little different.  Describes the pain as a pressure, mild, nonradiating. She was also recently diagnosed with bronchitis and prescribed doxycycline and a 12 day course of prednisone taper, which she is still taking. 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  She also endorses some upper abdominal discomfort that she states is hard to describe. Her symptoms continue to be intermittent and did not seem to be consistent with a pattern.  Not consistently present with ambulation or exertion.  Denies fever/chills, syncope, numbness, weakness, back pain, urinary symptoms, lower extremity edema/pain, or any other complaints.      Past Medical History:  Diagnosis Date  . Abdominal pain    "right lower quadrant pain and right lower back pain  . Anxiety   . Deviated septum 09/2011  . GERD (gastroesophageal reflux disease)   . Headache(784.0)    sinus  . History of echocardiogram    Echo 11/17: EF 55-60, no RWMA, normal diastolic function  . Hypertension    no meds  . Kidney stones    no current problem  . Migraine   . Nasal turbinate hypertrophy 09/2011   bilat.  . Pelvic inflammatory disease   . Urticaria     Patient Active Problem List   Diagnosis Date Noted  . Gallstone 01/07/2016  . History of colonic polyps 11/27/2015  . Right lower quadrant abdominal pain 11/27/2015  . HTN (hypertension),  benign 11/24/2015  . Abdominal pain 11/16/2015  . Headache 10/13/2015  . 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 Anxiety disorder 11/03/2014    Past Surgical History:  Procedure Laterality Date  . CHOLECYSTECTOMY N/A 01/15/2016   Procedure: LAPAROSCOPIC CHOLECYSTECTOMY WITH INTRAOPERATIVE CHOLANGIOGRAM;  Surgeon: Christene Lye, MD;  Location: [facility] ORS;  Service: General;  Laterality: N/A;  . COLONOSCOPY  2007   [facility]: pedunculated 24mm polyp in distal sigmoid, sessile 63mm polyp at splenic flexure, larger polyp tubulovillous adenoma and smaller polyp tubular adenoma   . COLONOSCOPY  09-09-15  . COLONOSCOPY WITH PROPOFOL N/A 03/01/2016   Procedure: COLONOSCOPY WITH PROPOFOL;  Surgeon: Garlan Fair, MD;  Location: [facility];  Service: Endoscopy;  Laterality: N/A;  . NASAL SEPTOPLASTY W/ TURBINOPLASTY  10/04/2011   Procedure: NASAL SEPTOPLASTY WITH TURBINATE REDUCTION;  Surgeon: Ascencion Dike, MD;  Location: [facility];  Service: ENT;  Laterality: Bilateral;  . WISDOM TOOTH EXTRACTION  2009     OB History    Gravida  5   Para  4   Term  4   Preterm      AB  1   Living  4     SAB  1   TAB      Ectopic      Multiple      Live Births  4        Obstetric Comments  1st Menstrual Cycle:  10  1st Pregnancy:  16         Family History  Problem Relation Age of Onset  . Kidney cancer Mother   . Hypertension Mother   . Migraines Mother   . Multiple sclerosis Mother   . Fibromyalgia Mother   . Liver disease Mother        NASH   . Hypertension Father   . Liver disease Father        liver transplant; had fatty liver disease  . Spina bifida Brother   . Migraines Maternal Grandmother   . Hypertension Other   . Colon cancer Paternal Grandfather   . Allergic rhinitis Daughter   . Asthma Daughter   . Allergic rhinitis Son   . Asthma Son   . Angioedema Neg Hx   . Eczema Neg Hx   . Immunodeficiency Neg Hx   . Urticaria Neg Hx     Social History    Tobacco Use  . Smoking status: Current Every Day Smoker    Packs/day: 0.00    Years: 10.00    Pack years: 0.00  . Smokeless tobacco: Never Used  Vaping Use  . Vaping Use: Never used  Substance Use Topics  . Alcohol use: Yes    Alcohol/week: 0.0 standard drinks    Comment: ocassionally  . Drug use: No    Home Medications Prior to Admission medications   Medication Sig Start Date End Date Taking? Authorizing Provider  acetaminophen (TYLENOL) 500 MG tablet Take 500-1,000 mg by mouth every 6 (six) hours as needed for mild pain.     [provider]  albuterol (VENTOLIN HFA) 108 (90 Base) MCG/ACT inhaler Inhale 2 puffs into the lungs every 6 (six) hours as needed for wheezing or shortness of breath.    [provider]  clonazePAM (KLONOPIN) 0.5 MG tablet Take 0.5 mg by mouth 2 (two) times daily as needed for anxiety.     [provider]  cyclobenzaprine (FLEXERIL) 10 MG tablet Take 1 tablet (10 mg total) by mouth 2 (two) times daily as needed for muscle spasms. Patient not taking: Reported on 03/27/2020 12/15/19   Loura Halt A, NP  ibuprofen (ADVIL) 200 MG tablet Take 200 mg by mouth every 6 (six) hours as needed for moderate pain.     [provider]  levofloxacin (LEVAQUIN) 250 MG tablet Take 250 mg by mouth daily.    [provider]  loratadine (CLARITIN) 10 MG tablet Take 10 mg by mouth daily.    [provider]  montelukast (SINGULAIR) 10 MG tablet Take 10 mg by mouth at bedtime.    [provider]  nystatin (MYCOSTATIN) 100000 UNIT/ML suspension Take 5 mLs (500,000 Units total) by mouth 4 (four) times daily. Swish in mouth and swallow for up to 7 days until thrush resolves 05/21/20   Little, Wenda Overland, MD  omeprazole (PRILOSEC) 40 MG capsule Take 40 mg by mouth daily.     [provider]  potassium chloride (KLOR-CON) 10 MEQ tablet Take 10 mEq by mouth daily.     [provider]  potassium chloride  (KLOR-CON) 10 MEQ tablet Take 1 tablet (10 mEq total) by mouth 2 (two) times daily for 3 days. Patient not taking: Reported on 03/27/2020 03/01/20 03/27/20  Fredia Sorrow, MD  Prenat-Fe Poly-Methfol-FA-DHA (VITAFOL ULTRA) 29-0.6-0.4-200 MG CAPS Take 1 capsule by mouth daily before breakfast. 11/01/18   Shelly Bombard, MD  sertraline (ZOLOFT) 25 MG tablet  Take 25 mg by mouth daily.    [provider]  Vitamin D, Ergocalciferol, (DRISDOL) 1.25 MG (50000 UNIT) CAPS capsule Take 50,000 Units by mouth See admin instructions. Twice weekly    [provider]  escitalopram (LEXAPRO) 10 MG tablet Take 10 mg by mouth daily.    09/02/11  [provider]    Allergies    Clarithromycin, Doxycycline, Dicyclomine, Sulfasalazine, Bentyl [dicyclomine hcl], Haldol [haloperidol], Meclizine, Morphine and related, Toradol [ketorolac tromethamine], Macrobid [nitrofurantoin monohyd macro], Meclizine hcl, Nitrofurantoin, and Sulfa antibiotics  Review of Systems   Review of Systems  Constitutional: Negative for chills and fever.  Respiratory: Positive for shortness of breath (intermittent).   Cardiovascular: Positive for chest pain.  Gastrointestinal: Negative for abdominal pain, blood in stool, diarrhea, nausea and vomiting.  Genitourinary: Negative for dysuria, frequency and hematuria.  Musculoskeletal: Negative for back pain.  Neurological: Negative for dizziness, syncope, weakness and headaches.  All other systems reviewed and are negative.   Physical Exam Updated Vital Signs BP (!) 142/92   Pulse 71   Temp 98 F (36.7 C)   Resp 18   Ht 5\' 2"  (1.575 m)   Wt 93.9 kg   LMP 05/17/2020   SpO2 100%   BMI 37.86 kg/m   Physical Exam Vitals and nursing note reviewed.  Constitutional:      General: She is not in acute distress.    Appearance: She is well-developed. She is not diaphoretic.  HENT:     Head: Normocephalic and atraumatic.     Mouth/Throat:     Mouth: Mucous  membranes are moist.     Pharynx: Oropharynx is clear.  Eyes:     Conjunctiva/sclera: Conjunctivae normal.  Cardiovascular:     Rate and Rhythm: Normal rate and regular rhythm.     Pulses: Normal pulses.          Radial pulses are 2+ on the right side and 2+ on the left side.       Posterior tibial pulses are 2+ on the right side and 2+ on the left side.     Heart sounds: Normal heart sounds.     Comments: Tactile temperature in the extremities appropriate and equal bilaterally. Pulmonary:     Effort: Pulmonary effort is normal. No respiratory distress.     Breath sounds: Normal breath sounds.  Abdominal:     Palpations: Abdomen is soft.     Tenderness: There is no abdominal tenderness. There is no guarding.  Musculoskeletal:     Cervical back: Neck supple.     Right lower leg: No edema.     Left lower leg: No edema.  Lymphadenopathy:     Cervical: No cervical adenopathy.  Skin:    General: Skin is warm and dry.  Neurological:     Mental Status: She is alert.     Comments: No noted acute cognitive deficit. Sensation grossly intact to light touch in the extremities.   Grip strengths equal bilaterally.   Strength 5/5 in all extremities.  No gait disturbance.  Coordination intact.  Cranial nerves III-XII grossly intact.  Handles oral secretions without noted difficulty.  No noted phonation or speech deficit. No facial droop.   Psychiatric:        Mood and Affect: Mood and affect normal.        Speech: Speech normal.        Behavior: Behavior normal.     ED Results / Procedures / Treatments   Labs (all  labs ordered are listed, but only abnormal results are displayed) Labs Reviewed  BASIC METABOLIC PANEL - Abnormal; Notable for the following components:      Result Value   Sodium 134 (*)    Potassium 3.4 (*)    Glucose, Bld 121 (*)    Calcium 8.8 (*)    All other components within normal limits  CBC - Abnormal; Notable for the following components:   WBC 14.8 (*)     All other components within normal limits  D-DIMER, QUANTITATIVE (NOT AT [facility]) - Abnormal; Notable for the following components:   D-Dimer, Quant 0.51 (*)    All other components within normal limits  PREGNANCY, URINE  LIPASE, BLOOD  HEPATIC FUNCTION PANEL  TROPONIN I (HIGH SENSITIVITY)  TROPONIN I (HIGH SENSITIVITY)    EKG None  ED ECG REPORT   Date: 05/28/2020  Rate: 87  Rhythm: Sinus rhythm  QRS Axis: normal  Intervals: normal  ST/T Wave abnormalities: nonspecific ST/T changes  Conduction Disutrbances:none  Narrative Interpretation:   Old EKG Reviewed: unchanged  I have personally reviewed the EKG tracing and agree with the computerized printout as noted.  Radiology DG Chest 2 View  Result Date: 05/27/2020 CLINICAL DATA:  Chest pain. EXAM: CHEST - 2 VIEW COMPARISON:  03/26/2020. FINDINGS: The heart size and mediastinal contours are within normal limits. Both lungs are clear. No visible pleural effusions or pneumothorax. No acute osseous abnormality. Cholecystectomy clips. IMPRESSION: No active cardiopulmonary disease. Electronically Signed   By: Margaretha Sheffield MD   On: 05/27/2020 14:31   CT Angio Chest PE W and/or Wo Contrast  Result Date: 05/27/2020 CLINICAL DATA:  Chest pain since 10 a.m., bronchitis, positive D dimer EXAM: CT ANGIOGRAPHY CHEST WITH CONTRAST TECHNIQUE: Multidetector CT imaging of the chest was performed using the standard protocol during bolus administration of intravenous contrast. Multiplanar CT image reconstructions and MIPs were obtained to evaluate the vascular anatomy. CONTRAST:  164mL OMNIPAQUE IOHEXOL 350 MG/ML SOLN COMPARISON:  05/27/2020 FINDINGS: Cardiovascular: This is a technically adequate evaluation of the pulmonary vasculature. No filling defects or pulmonary emboli. The heart is unremarkable without pericardial effusion. Normal caliber of the thoracic aorta. Mediastinum/Nodes: No enlarged mediastinal, hilar, or axillary lymph nodes. Thyroid  gland, trachea, and esophagus demonstrate no significant findings. Lungs/Pleura: No airspace disease, effusion, or pneumothorax. Central airways are widely patent. Upper Abdomen: Diffuse hepatic steatosis. No acute upper abdominal finding. Musculoskeletal: No acute or destructive bony lesions. Reconstructed images demonstrate no additional findings. Review of the MIP images confirms the above findings. IMPRESSION: 1. No evidence of pulmonary embolus. 2. No acute intrathoracic process. 3. Hepatic steatosis. Electronically Signed   By: Randa Ngo M.D.   On: 05/27/2020 19:28    Procedures Procedures (including critical care time)  Medications Ordered in ED Medications  iohexol (OMNIPAQUE) 350 MG/ML injection 100 mL (100 mLs Intravenous Contrast Given 05/27/20 1917)    ED Course  I have reviewed the triage vital signs and the nursing notes.  Pertinent labs & imaging results that were available during my care of the patient were reviewed by me and considered in my medical decision making (see chart for details).    MDM Rules/Calculators/A&P                          Patient presents with intermittent chest discomfort and shortness of breath. Patient is nontoxic appearing, afebrile, not tachycardic, not tachypneic, not hypotensive, maintains excellent SPO2 on room air, and is  in no apparent distress.   I have reviewed the patient's chart to obtain more information.   I reviewed and interpreted the patient's labs and radiological studies. Leukocytosis, however, patient is currently taking prednisone. Delta troponins negative.  EKG without acute evidence of ischemia. D-dimer barely elevated.  CT PE study without acute abnormalities. Patient to follow-up with pulmonology and cardiology due to her persistent symptoms. The patient was given instructions for home care as well as return precautions. Patient voices understanding of these instructions, accepts the plan, and is comfortable with  discharge.    Vitals:   05/27/20 1526 05/27/20 1739 05/27/20 1800 05/27/20 1956  BP: (!) 151/89 (!) 162/92 (!) 155/84 (!) 142/92  Pulse: 66 75 67 71  Resp: 18 20 18 18   Temp:    98 F (36.7 C)  TempSrc:      SpO2: 99% 98% 98% 100%  Weight:      Height:         Final Clinical Impression(s) / ED Diagnoses Final diagnoses:  Chest discomfort    Rx / DC Orders ED Discharge Orders    None       Layla Maw 05/28/20 1558    Tegeler, Gwenyth Allegra, MD 05/28/20 1610

## 2020-06-02 ENCOUNTER — Encounter: Payer: Self-pay | Admitting: Pulmonary Disease

## 2020-06-02 ENCOUNTER — Telehealth: Payer: Medicaid Other | Admitting: Emergency Medicine

## 2020-06-02 ENCOUNTER — Ambulatory Visit: Payer: Medicaid Other | Admitting: Pulmonary Disease

## 2020-06-02 ENCOUNTER — Other Ambulatory Visit: Payer: Self-pay

## 2020-06-02 VITALS — BP 140/82 | HR 92 | Temp 98.0°F | Ht 62.0 in | Wt 209.0 lb

## 2020-06-02 DIAGNOSIS — R053 Chronic cough: Secondary | ICD-10-CM

## 2020-06-02 DIAGNOSIS — R300 Dysuria: Secondary | ICD-10-CM

## 2020-06-02 DIAGNOSIS — J309 Allergic rhinitis, unspecified: Secondary | ICD-10-CM

## 2020-06-02 MED ORDER — CEPHALEXIN 500 MG PO CAPS: 500.0000 mg | ORAL_CAPSULE | Freq: Two times a day (BID) | ORAL | 0 refills | Status: DC

## 2020-06-02 NOTE — Patient Instructions (Signed)
Chronic cough --START Symbicort 160-4.5 mcg TWO puffs TWICE daily --CONTINUE Albuterol as needed for shortness of breath or wheezing --Will arrange for pulmonary function tests  Allergic Rhinitis Followed by Allergy at [facility] --Continue xyzal --Continue singulair --Continue Flonase  Follow-up with NP after PFTs  Follow-up with in 4 months with me

## 2020-06-02 NOTE — Progress Notes (Signed)
Subjective:   PATIENT ID: Christina Pilar GENDER: female DOB: 30-Sep-1980, MRN: [id number]   HPI  Chief Complaint  Patient presents with  . Consult    Patient had covid in July, has had Pneumonia and bronchitis. Several ED visits last on 12/1 D-dimer slightly elevated. Shortness of breath with exertion and productive cough    Reason for Visit: New consult for shortness of breath  Christina Fox is a 39 year old female active smoker (social smoker), prior covid infection in 12/2019 who presents for chronic bronchitis.  She was recently see in the ED on 05/27/20. CTA completed with no evidence of PE. Referred to pulmonary for persistent respiratory symptoms.  Since being diagnosed with covid, she has had intermittent shortness of breath and chest pain. She denies prior respiratory issues before this year, rarely having episodes of bronchitis. After July, she has had chronic productive cough and shortness of breath. Sometimes has wheezing. She has an episode of post-tussive emesis. She attributes chest pain related to her frequent coughing spells. She tries to drink tea with lemon and support herself at home but when her symptoms are severe she will go to urgent care. Albuterol does improve her symptoms as well. She has multiple visits for this including episode of bronchitis last month s/p course of doxycycline and prednisone. She was prescribed inhaler by her Allergist but has not started. Of note, she is undergoing a work-up for diarrhea with a GI specialist. She is on PPI and only has symptoms when she is off the medication. She eats small meals and limit caffeine, spicy and overeating.   Social History: Social smoker. 2-3 cigarettes when out  I have personally reviewed patient's past medical/family/social history, allergies, current medications  Past Medical History:  Diagnosis Date  . Abdominal pain    "right lower quadrant pain and right lower back pain  . Anxiety   .  Deviated septum 09/2011  . GERD (gastroesophageal reflux disease)   . Headache(784.0)    sinus  . History of echocardiogram    Echo 11/17: EF 55-60, no RWMA, normal diastolic function  . Hypertension    no meds  . Kidney stones    no current problem  . Migraine   . Nasal turbinate hypertrophy 09/2011   bilat.  . Pelvic inflammatory disease   . Urticaria      Family History  Problem Relation Age of Onset  . Kidney cancer Mother   . Hypertension Mother   . Migraines Mother   . Multiple sclerosis Mother   . Fibromyalgia Mother   . Liver disease Mother        NASH   . Hypertension Father   . Liver disease Father        liver transplant; had fatty liver disease  . Spina bifida Brother   . Migraines Maternal Grandmother   . Hypertension Other   . Colon cancer Paternal Grandfather   . Allergic rhinitis Daughter   . Asthma Daughter   . Allergic rhinitis Son   . Asthma Son   . Angioedema Neg Hx   . Eczema Neg Hx   . Immunodeficiency Neg Hx   . Urticaria Neg Hx      Social History   Occupational History  . 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 Smoking status: Current Every Day Smoker    Packs/day: 0.25    Years: 10.00    Pack years: 2.50    Types: Cigarettes  .  Smokeless tobacco: Never Used  . Tobacco comment: 2-3 cigarettes a day  Vaping Use  . Vaping Use: Never used  Substance and Sexual Activity  . Alcohol use: Yes    Alcohol/week: 0.0 standard drinks    Comment: ocassionally  . Drug use: No  . Sexual activity: Not on file    Allergies  Allergen Reactions  . Clarithromycin Hives and Itching  . Doxycycline Shortness Of Breath    Chest pain  . Dicyclomine Other (See Comments) and Rash    agitation   . Sulfasalazine Rash and Hives  . Bentyl [Dicyclomine Hcl] Other (See Comments)    DIZZINESS  . Haldol [Haloperidol] Other (See Comments)    Made her feel very jittery and agitated  . Meclizine     Chest pain  . Morphine And  Related Other (See Comments)    Made her feel like she was losing her mind  . Toradol [Ketorolac Tromethamine] Other (See Comments)    Made her feel very jittery and agitated  . Macrobid [Nitrofurantoin Monohyd Macro] Rash  . Meclizine Hcl Rash  . Nitrofurantoin Rash  . Sulfa Antibiotics Hives and Rash     Outpatient Medications Prior to Visit  Medication Sig Dispense Refill  . acetaminophen (TYLENOL) 500 MG tablet Take 500-1,000 mg by mouth every 6 (six) hours as needed for mild pain.     Marland Kitchen albuterol (VENTOLIN HFA) 108 (90 Base) MCG/ACT inhaler Inhale 2 puffs into the lungs every 6 (six) hours as needed for wheezing or shortness of breath.    . cephALEXin (KEFLEX) 500 MG capsule Take 1 capsule (500 mg total) by mouth 2 (two) times daily. 14 capsule 0  . clonazePAM (KLONOPIN) 0.5 MG tablet Take 0.5 mg by mouth 2 (two) times daily as needed for anxiety.     Marland Kitchen ibuprofen (ADVIL) 200 MG tablet Take 200 mg by mouth every 6 (six) hours as needed for moderate pain.     . montelukast (SINGULAIR) 10 MG tablet Take 10 mg by mouth at bedtime.    Marland Kitchen omeprazole (PRILOSEC) 40 MG capsule Take 40 mg by mouth daily.     . Prenat-Fe Poly-Methfol-FA-DHA (VITAFOL ULTRA) 29-0.6-0.4-200 MG CAPS Take 1 capsule by mouth daily before breakfast. 30 capsule 11  . Vitamin D, Ergocalciferol, (DRISDOL) 1.25 MG (50000 UNIT) CAPS capsule Take 50,000 Units by mouth See admin instructions. Twice weekly    . cyclobenzaprine (FLEXERIL) 10 MG tablet Take 1 tablet (10 mg total) by mouth 2 (two) times daily as needed for muscle spasms. (Patient not taking: Reported on 03/27/2020) 20 tablet 0  . levofloxacin (LEVAQUIN) 250 MG tablet Take 250 mg by mouth daily.    Marland Kitchen loratadine (CLARITIN) 10 MG tablet Take 10 mg by mouth daily.    Marland Kitchen nystatin (MYCOSTATIN) 100000 UNIT/ML suspension Take 5 mLs (500,000 Units total) by mouth 4 (four) times daily. Swish in mouth and swallow for up to 7 days until thrush resolves 60 mL 0  . potassium  chloride (KLOR-CON) 10 MEQ tablet Take 10 mEq by mouth daily.     . potassium chloride (KLOR-CON) 10 MEQ tablet Take 1 tablet (10 mEq total) by mouth 2 (two) times daily for 3 days. (Patient not taking: Reported on 03/27/2020) 6 tablet 0  . sertraline (ZOLOFT) 25 MG tablet Take 25 mg by mouth daily.     No facility-administered medications prior to visit.    Review of Systems  Constitutional: Negative for chills, diaphoresis, fever, malaise/fatigue and weight loss.  HENT: Positive for congestion and sore throat. Negative for ear pain.   Respiratory: Positive for cough, sputum production, shortness of breath and wheezing. Negative for hemoptysis.   Cardiovascular: Positive for chest pain. Negative for palpitations and leg swelling.  Gastrointestinal: Negative for abdominal pain, heartburn and nausea.  Genitourinary: Negative for frequency.  Musculoskeletal: Negative for joint pain and myalgias.  Skin: Negative for itching and rash.  Neurological: Positive for headaches. Negative for dizziness and weakness.  Endo/Heme/Allergies: Does not bruise/bleed easily.  Psychiatric/Behavioral: Negative for depression. The patient is nervous/anxious.      Objective:   Vitals:   06/02/20 1442  BP: 140/82  Pulse: 92  Temp: 98 F (36.7 C)  TempSrc: Temporal  SpO2: 98%  Weight: 209 lb (94.8 kg)  Height: 5\' 2"  (1.575 m)   SpO2: 98 % O2 Device: None (Room air)  Physical Exam: General: Well-appearing, no acute distress HENT: [place], AT Eyes: EOMI, no scleral icterus Respiratory: Clear to auscultation bilaterally.  No crackles, wheezing or rales Cardiovascular: RRR, -M/R/Fox, no JVD Extremities:-Edema,-tenderness Neuro: AAO x4, CNII-XII grossly intact Skin: Intact, no rashes or bruising Psych: Normal mood, normal affect  Data Reviewed:  Imaging: CTA 05/27/20 - No pulmonary embolism. Normal parenchyma  PFT: 11/03/16 FVC 2.81 (85%) FEV1 2.23 (79%) Ratio 79   Interpretation: No obstructive  defect. Borderline low FEV1. Not suggestive of restrictive defect  Labs: CBC    Component Value Date/Time   WBC 14.8 (H) 05/27/2020 1349   RBC 4.58 05/27/2020 1349   HGB 13.8 05/27/2020 1349   HGB 12.6 03/29/2016 1703   HCT 41.5 05/27/2020 1349   HCT 38.3 03/29/2016 1703   PLT 318 05/27/2020 1349   PLT 347 03/29/2016 1703   MCV 90.6 05/27/2020 1349   MCV 88 03/29/2016 1703   MCH 30.1 05/27/2020 1349   MCHC 33.3 05/27/2020 1349   RDW 13.2 05/27/2020 1349   RDW 13.0 03/29/2016 1703   LYMPHSABS 1.6 05/01/2020 1559   MONOABS 0.9 05/01/2020 1559   EOSABS 0.4 05/01/2020 1559   BASOSABS 0.1 05/01/2020 1559  Absolute Eos: 400  Imaging, labs and test noted above have been reviewed independently by me.    Assessment & Plan:   Discussion: 40 year old female with prior history of covid who presents for chronic cough. Differential diagnosis includes asthma, chronic bronchitis, upper airway cough syndrome and reflux (less likely.)  Chronic cough --START Symbicort 160-4.5 mcg TWO puffs TWICE daily --CONTINUE Albuterol as needed for shortness of breath or wheezing --Will arrange for pulmonary function tests --Labs: IgE  Allergic Rhinitis Followed by Allergy at [facility] Maintenance Immunization History  Administered Date(s) Administered  . Tdap 06/01/2012   CT Lung Screen - not indicated  Orders Placed This Encounter  Procedures  . IgE    Standing Status:   Future    Number of Occurrences:   1    Standing Expiration Date:   06/02/2021  . Pulmonary function test    Standing Status:   Future    Number of Occurrences:   1    Standing Expiration Date:   06/02/2021    Order Specific Question:   Where should this test be performed?    Answer:   [facility]    Order Specific Question:   Full PFT: includes the following: basic spirometry, spirometry pre & post bronchodilator, diffusion capacity (DLCO),  lung volumes    Answer:   Full PFT  No orders of  the defined types were placed in this encounter.   Return in about 4 months (around 10/01/2020).  I have spent a total time of 45-minutes on the day of the appointment reviewing prior documentation, coordinating care and discussing medical diagnosis and plan with the patient/family. Imaging, labs and tests included in this note have been reviewed and interpreted independently by me.  [place], MD [facility] 06/02/2020 3:00 PM  Office Number [phone]

## 2020-06-02 NOTE — Progress Notes (Signed)

## 2020-06-03 LAB — IGE: IgE (Immunoglobulin E), Serum: 40 kU/L (ref <=114)

## 2020-06-16 ENCOUNTER — Encounter (HOSPITAL_COMMUNITY): Payer: Self-pay | Admitting: Emergency Medicine

## 2020-06-16 ENCOUNTER — Other Ambulatory Visit: Payer: Self-pay

## 2020-06-16 DIAGNOSIS — I10 Essential (primary) hypertension: Secondary | ICD-10-CM | POA: Insufficient documentation

## 2020-06-16 DIAGNOSIS — R1084 Generalized abdominal pain: Secondary | ICD-10-CM | POA: Diagnosis present

## 2020-06-16 DIAGNOSIS — F17210 Nicotine dependence, cigarettes, uncomplicated: Secondary | ICD-10-CM | POA: Insufficient documentation

## 2020-06-16 DIAGNOSIS — R140 Abdominal distension (gaseous): Secondary | ICD-10-CM | POA: Insufficient documentation

## 2020-06-16 DIAGNOSIS — E876 Hypokalemia: Secondary | ICD-10-CM | POA: Insufficient documentation

## 2020-06-16 NOTE — ED Triage Notes (Signed)
Pt reports that she had a colonoscopy on Saturday and her stomach has been swelling since. Also reports numbness in both arms and face today. Reports epigastric pain as well. States that she is very anxious. Called the GI and they told her to come in in the am, but she feels like something is not right.

## 2020-06-17 ENCOUNTER — Emergency Department (HOSPITAL_COMMUNITY)
Admission: EM | Admit: 2020-06-17 | Discharge: 2020-06-17 | Disposition: A | Discharge status: Home / Self Care | Payer: Medicaid Other | Attending: Emergency Medicine | Admitting: Emergency Medicine

## 2020-06-17 ENCOUNTER — Emergency Department (HOSPITAL_COMMUNITY): Payer: Medicaid Other

## 2020-06-17 DIAGNOSIS — E876 Hypokalemia: Secondary | ICD-10-CM

## 2020-06-17 DIAGNOSIS — R140 Abdominal distension (gaseous): Secondary | ICD-10-CM

## 2020-06-17 LAB — CBC
HCT: 39.6 % (ref 36.0–46.0)
Hemoglobin: 13.2 g/dL (ref 12.0–15.0)
MCH: 30.3 pg (ref 26.0–34.0)
MCHC: 33.3 g/dL (ref 30.0–36.0)
MCV: 90.8 fL (ref 80.0–100.0)
Platelets: 319 10*3/uL (ref 150–400)
RBC: 4.36 MIL/uL (ref 3.87–5.11)
RDW: 13.1 % (ref 11.5–15.5)
WBC: 7.3 10*3/uL (ref 4.0–10.5)
nRBC: 0 % (ref 0.0–0.2)

## 2020-06-17 LAB — COMPREHENSIVE METABOLIC PANEL
ALT: 51 U/L — ABNORMAL HIGH (ref 0–44)
AST: 26 U/L (ref 15–41)
Albumin: 4.2 g/dL (ref 3.5–5.0)
Alkaline Phosphatase: 60 U/L (ref 38–126)
Anion gap: 10 (ref 5–15)
BUN: 7 mg/dL (ref 6–20)
CO2: 25 mmol/L (ref 22–32)
Calcium: 9.6 mg/dL (ref 8.9–10.3)
Chloride: 103 mmol/L (ref 98–111)
Creatinine, Ser: 0.72 mg/dL (ref 0.44–1.00)
GFR, Estimated: >60 mL/min (ref >60)
Glucose, Bld: 103 mg/dL — ABNORMAL HIGH (ref 70–99)
Potassium: 2.9 mmol/L — ABNORMAL LOW (ref 3.5–5.1)
Sodium: 138 mmol/L (ref 135–145)
Total Bilirubin: 0.6 mg/dL (ref 0.3–1.2)
Total Protein: 7.2 g/dL (ref 6.5–8.1)

## 2020-06-17 LAB — URINALYSIS, ROUTINE W REFLEX MICROSCOPIC
Bilirubin Urine: NEGATIVE
Glucose, UA: NEGATIVE mg/dL
Hgb urine dipstick: NEGATIVE
Ketones, ur: NEGATIVE mg/dL
Leukocytes,Ua: NEGATIVE
Nitrite: NEGATIVE
Protein, ur: NEGATIVE mg/dL
Specific Gravity, Urine: 1.004 — ABNORMAL LOW (ref 1.005–1.030)
pH: 7 (ref 5.0–8.0)

## 2020-06-17 LAB — POC URINE PREG, ED: Preg Test, Ur: NEGATIVE

## 2020-06-17 LAB — LIPASE, BLOOD: Lipase: 33 U/L (ref 11–51)

## 2020-06-17 MED ORDER — POTASSIUM CHLORIDE CRYS ER 20 MEQ PO TBCR
80.0000 meq | EXTENDED_RELEASE_TABLET | Freq: Once | ORAL | Status: AC
  Administered 2020-06-17: 04:00:00 80 meq via ORAL
  Filled 2020-06-17: qty 4

## 2020-06-17 NOTE — ED Provider Notes (Signed)
[facility]-EMERGENCY DEPT Provider Note   CSN: [id number] Arrival date & time: 06/16/20  2238     History Chief Complaint  Patient presents with  . Abdominal Pain  . Numbness    Jalonda A Bettendorf is a 39 y.o. female.  Had a colonoscopy a few days ago. Still feeling bloated and nauseous. No diarrhea or constipation. Difficult to work 2/2 symptoms.    Abdominal Pain Pain location:  Generalized Pain quality: aching   Pain radiates to:  Does not radiate Pain severity:  Mild Timing:  Constant Chronicity:  New Relieved by:  None tried Worsened by:  Nothing      Past Medical History:  Diagnosis Date  . Abdominal pain    "right lower quadrant pain and right lower back pain  . Anxiety   . Deviated septum 09/2011  . GERD (gastroesophageal reflux disease)   . Headache(784.0)    sinus  . History of echocardiogram    Echo 11/17: EF 55-60, no RWMA, normal diastolic function  . Hypertension    no meds  . Kidney stones    no current problem  . Migraine   . Nasal turbinate hypertrophy 09/2011   bilat.  . Pelvic inflammatory disease   . Urticaria     Patient Active Problem List   Diagnosis Date Noted  . Gallstone 01/07/2016  . History of colonic polyps 11/27/2015  . Right lower quadrant abdominal pain 11/27/2015  . HTN (hypertension), benign 11/24/2015  . Abdominal pain 11/16/2015  . Headache 10/13/2015  . 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 Anxiety disorder 11/03/2014    Past Surgical History:  Procedure Laterality Date  . CHOLECYSTECTOMY N/A 01/15/2016   Procedure: LAPAROSCOPIC CHOLECYSTECTOMY WITH INTRAOPERATIVE CHOLANGIOGRAM;  Surgeon: Kieth Brightly, MD;  Location: [facility] ORS;  Service: General;  Laterality: N/A;  . COLONOSCOPY  2007   [facility]: pedunculated 79mm polyp in distal sigmoid, sessile 40mm polyp at splenic flexure, larger polyp tubulovillous adenoma and smaller polyp tubular adenoma   . COLONOSCOPY  09-09-15  . COLONOSCOPY WITH  PROPOFOL N/A 03/01/2016   Procedure: COLONOSCOPY WITH PROPOFOL;  Surgeon: Charolett Bumpers, MD;  Location: [facility];  Service: Endoscopy;  Laterality: N/A;  . NASAL SEPTOPLASTY W/ TURBINOPLASTY  10/04/2011   Procedure: NASAL SEPTOPLASTY WITH TURBINATE REDUCTION;  Surgeon: Darletta Moll, MD;  Location: [facility];  Service: ENT;  Laterality: Bilateral;  . WISDOM TOOTH EXTRACTION  2009     OB History    Gravida  5   Para  4   Term  4   Preterm      AB  1   Living  4     SAB  1   IAB      Ectopic      Multiple      Live Births  4        Obstetric Comments  1st Menstrual Cycle:  10  1st Pregnancy:  37         Family History  Problem Relation Age of Onset  . Kidney cancer Mother   . Hypertension Mother   . Migraines Mother   . Multiple sclerosis Mother   . Fibromyalgia Mother   . Liver disease Mother        NASH   . Hypertension Father   . Liver disease Father        liver transplant; had fatty liver disease  . Spina bifida Brother   . Migraines Maternal Grandmother   .  Hypertension Other   . Colon cancer Paternal Grandfather   . Allergic rhinitis Daughter   . Asthma Daughter   . Allergic rhinitis Son   . Asthma Son   . Angioedema Neg Hx   . Eczema Neg Hx   . Immunodeficiency Neg Hx   . Urticaria Neg Hx     Social History   Tobacco Use  . Smoking status: Current Every Day Smoker    Packs/day: 0.25    Years: 10.00    Pack years: 2.50    Types: Cigarettes  . Smokeless tobacco: Never Used  . Tobacco comment: 2-3 cigarettes a day  Vaping Use  . Vaping Use: Never used  Substance Use Topics  . Alcohol use: Yes    Alcohol/week: 0.0 standard drinks    Comment: ocassionally  . Drug use: No    Home Medications Prior to Admission medications   Medication Sig Start Date End Date Taking? Authorizing Provider  acetaminophen (TYLENOL) 500 MG tablet Take 500-1,000 mg by mouth every 6 (six) hours as needed for mild pain.    Yes [provider]  albuterol (VENTOLIN HFA) 108 (90 Base) MCG/ACT inhaler Inhale 2 puffs into the lungs every 6 (six) hours as needed for wheezing or shortness of breath.   Yes [provider]  amoxicillin (AMOXIL) 500 MG capsule Take 500 mg by mouth 3 (three) times daily. 06/10/20  Yes [provider]  clonazePAM (KLONOPIN) 0.5 MG tablet Take 0.5 mg by mouth 2 (two) times daily as needed for anxiety.    Yes [provider]  ibuprofen (ADVIL) 200 MG tablet Take 200 mg by mouth every 6 (six) hours as needed for moderate pain.    Yes [provider]  montelukast (SINGULAIR) 10 MG tablet Take 10 mg by mouth at bedtime.   Yes [provider]  omeprazole (PRILOSEC) 40 MG capsule Take 40 mg by mouth daily.    Yes [provider]  Prenat-Fe Poly-Methfol-FA-DHA (VITAFOL ULTRA) 29-0.6-0.4-200 MG CAPS Take 1 capsule by mouth daily before breakfast. 11/01/18  Yes Shelly Bombard, MD  Vitamin D, Ergocalciferol, (DRISDOL) 1.25 MG (50000 UNIT) CAPS capsule Take 50,000 Units by mouth See admin instructions. Twice weekly   Yes [provider]  escitalopram (LEXAPRO) 10 MG tablet Take 10 mg by mouth daily.    09/02/11  [provider]    Allergies    Clarithromycin, Doxycycline, Dicyclomine, Sulfasalazine, Bentyl [dicyclomine hcl], Haldol [haloperidol], Meclizine, Morphine and related, Toradol [ketorolac tromethamine], Macrobid [nitrofurantoin monohyd macro], Meclizine hcl, Nitrofurantoin, and Sulfa antibiotics  Review of Systems   Review of Systems  Gastrointestinal: Positive for abdominal pain.  All other systems reviewed and are negative.   Physical Exam Updated Vital Signs BP (!) 142/71 (BP Location: Right Arm)   Pulse 78   Temp 97.9 F (36.6 C) (Oral)   Resp 14   Ht 5\' 2"  (1.575 m)   Wt 94.8 kg   SpO2 98%   BMI 38.23 kg/m   Physical Exam Vitals and nursing note reviewed.  Constitutional:      Appearance: She is well-developed  and well-nourished.  HENT:     Head: Normocephalic and atraumatic.     Mouth/Throat:     Mouth: Mucous membranes are moist.     Pharynx: Oropharynx is clear.  Eyes:     Conjunctiva/sclera: Conjunctivae normal.     Pupils: Pupils are equal, round, and reactive to light.  Cardiovascular:     Rate and Rhythm: Normal  rate and regular rhythm.  Pulmonary:     Effort: No respiratory distress.     Breath sounds: No stridor.  Abdominal:     General: There is no distension.  Musculoskeletal:     Cervical back: Normal range of motion.  Skin:    General: Skin is warm and dry.  Neurological:     General: No focal deficit present.     Mental Status: She is alert.     ED Results / Procedures / Treatments   Labs (all labs ordered are listed, but only abnormal results are displayed) Labs Reviewed  COMPREHENSIVE METABOLIC PANEL - Abnormal; Notable for the following components:      Result Value   Potassium 2.9 (*)    Glucose, Bld 103 (*)    ALT 51 (*)    All other components within normal limits  URINALYSIS, ROUTINE W REFLEX MICROSCOPIC - Abnormal; Notable for the following components:   Color, Urine STRAW (*)    Specific Gravity, Urine 1.004 (*)    All other components within normal limits  LIPASE, BLOOD  CBC  POC URINE PREG, ED    EKG None  Radiology CT Renal Stone Study  Result Date: 06/17/2020 CLINICAL DATA:  Flank pain with kidney stone suspected EXAM: CT ABDOMEN AND PELVIS WITHOUT CONTRAST TECHNIQUE: Multidetector CT imaging of the abdomen and pelvis was performed following the standard protocol without IV contrast. COMPARISON:  05/01/2020 FINDINGS: Lower chest:  No contributory findings. Hepatobiliary: No focal liver abnormality.Cholecystectomy without bile duct dilatation. Pancreas: Unremarkable. Spleen: Unremarkable. Adrenals/Urinary Tract: Negative adrenals. No hydronephrosis or stone. Unremarkable bladder. Stomach/Bowel: No obstruction. No appendicitis. One or 2 sigmoid  diverticula. Vascular/Lymphatic: No acute vascular abnormality. No mass or adenopathy. Reproductive:No pathologic findings. Physiologic appearance of the ovaries. Other: No ascites or pneumoperitoneum. Small fatty umbilical hernia. Musculoskeletal: No acute abnormalities. IMPRESSION: 1. No acute finding or explanation for pain. 2. Small fatty umbilical hernia. 3. Punctate left renal calculus. Electronically Signed   By: Monte Fantasia M.D.   On: 06/17/2020 06:38    Procedures Procedures (including critical care time)  Medications Ordered in ED Medications  potassium chloride SA (KLOR-CON) CR tablet 80 mEq (80 mEq Oral Given 06/17/20 0419)    ED Course  I have reviewed the triage vital signs and the nursing notes.  Pertinent labs & imaging results that were available during my care of the patient were reviewed by me and considered in my medical decision making (see chart for details).    MDM Rules/Calculators/A&P                         eval for post procedure complications. H/o hypo-k, will give dose here, probably worse because of recent bowel cleanout and decreased intake.  Ct ok. Will continue K repletion at home. FU w/ GI.   Final Clinical Impression(s) / ED Diagnoses Final diagnoses:  Hypokalemia  Abdominal distension    Rx / DC Orders ED Discharge Orders    None       Park Beck, Corene Cornea, MD 06/18/20 [phone]

## 2020-07-03 ENCOUNTER — Other Ambulatory Visit (HOSPITAL_COMMUNITY): Payer: Medicaid Other

## 2020-07-04 ENCOUNTER — Other Ambulatory Visit (HOSPITAL_COMMUNITY)
Admission: RE | Admit: 2020-07-04 | Discharge: 2020-07-04 | Disposition: A | Discharge status: Home / Self Care | Payer: Medicaid Other | Source: Ambulatory Visit | Attending: Primary Care | Admitting: Primary Care

## 2020-07-04 ENCOUNTER — Other Ambulatory Visit (HOSPITAL_COMMUNITY): Payer: Medicaid Other

## 2020-07-04 DIAGNOSIS — Z20822 Contact with and (suspected) exposure to covid-19: Secondary | ICD-10-CM | POA: Diagnosis present

## 2020-07-04 LAB — SARS CORONAVIRUS 2 (TAT 6-24 HRS): SARS Coronavirus 2: NEGATIVE

## 2020-07-08 ENCOUNTER — Other Ambulatory Visit: Payer: Self-pay

## 2020-07-08 ENCOUNTER — Ambulatory Visit (INDEPENDENT_AMBULATORY_CARE_PROVIDER_SITE_OTHER): Payer: Medicaid Other | Admitting: Pulmonary Disease

## 2020-07-08 ENCOUNTER — Encounter: Payer: Self-pay | Admitting: Primary Care

## 2020-07-08 ENCOUNTER — Ambulatory Visit: Payer: Medicaid Other | Admitting: Primary Care

## 2020-07-08 VITALS — BP 124/86 | HR 89 | Temp 98.3°F | Ht 63.5 in | Wt 203.8 lb

## 2020-07-08 DIAGNOSIS — R053 Chronic cough: Secondary | ICD-10-CM

## 2020-07-08 DIAGNOSIS — J984 Other disorders of lung: Secondary | ICD-10-CM | POA: Diagnosis not present

## 2020-07-08 DIAGNOSIS — R058 Other specified cough: Secondary | ICD-10-CM | POA: Diagnosis not present

## 2020-07-08 DIAGNOSIS — E010 Iodine-deficiency related diffuse (endemic) goiter: Secondary | ICD-10-CM | POA: Diagnosis not present

## 2020-07-08 LAB — PULMONARY FUNCTION TEST
DL/VA % pred: 120 %
DL/VA: 5.37 ml/min/mmHg/L
DLCO cor % pred: 96 %
DLCO cor: 20.71 ml/min/mmHg
DLCO unc % pred: 96 %
DLCO unc: 20.71 ml/min/mmHg
FEF 25-75 Post: 2.12 L/sec
FEF 25-75 Pre: 2.43 L/sec
FEF2575-%Change-Post: -13 %
FEF2575-%Pred-Post: 68 %
FEF2575-%Pred-Pre: 78 %
FEV1-%Change-Post: -4 %
FEV1-%Pred-Post: 75 %
FEV1-%Pred-Pre: 78 %
FEV1-Post: 2.24 L
FEV1-Pre: 2.34 L
FEV1FVC-%Change-Post: -3 %
FEV1FVC-%Pred-Pre: 99 %
FEV6-%Change-Post: 0 %
FEV6-%Pred-Post: 78 %
FEV6-%Pred-Pre: 78 %
FEV6-Post: 2.81 L
FEV6-Pre: 2.8 L
FEV6FVC-%Pred-Post: 101 %
FEV6FVC-%Pred-Pre: 101 %
FVC-%Change-Post: -1 %
FVC-%Pred-Post: 77 %
FVC-%Pred-Pre: 78 %
FVC-Post: 2.81 L
FVC-Pre: 2.84 L
Post FEV1/FVC ratio: 80 %
Post FEV6/FVC ratio: 100 %
Pre FEV1/FVC ratio: 82 %
Pre FEV6/FVC Ratio: 100 %
RV % pred: 110 %
RV: 1.73 L
TLC % pred: 88 %
TLC: 4.42 L

## 2020-07-08 LAB — NITRIC OXIDE: Nitric Oxide: 9

## 2020-07-08 MED ORDER — AMOXICILLIN-POT CLAVULANATE 875-125 MG PO TABS: 1.0000 | ORAL_TABLET | Freq: Two times a day (BID) | ORAL | 0 refills | Status: DC

## 2020-07-08 NOTE — Assessment & Plan Note (Addendum)
-   Patient underwent thyroid ultrasound in 2018 that showed left upper pole nodule reduced in size. She underwent bx on 03/26/13. Pathology results not available in Epic for review. Recommend regular fu with PCP.

## 2020-07-08 NOTE — Patient Instructions (Addendum)
Pulmonary function showed restrictive lung disease likely d/t body habitus and hx covid-19. FENO was normal. Post nasal drip and GERD can contribute to upper airway cough. Recommend continuing Symbicort for another 3 months, can try taper you off at next visit.   Recommendations: - Continue Symbicort 160 - take 1-2 puffs twice daily  - Continue Singulair, Xyzal and Flonase - Continue Prilosec daily for GERD  - Use incentive spirometer 5-10 breaths an hour while awake to practice deep breathing exercises - Take Augmentin if sinusitis symptoms do not improve in 3-5 days with over the counter medication  Rx: - Augmentin 1 tab twice daily x 5 days   Order - FENO - Incentrive spirometer    Follow-up: - 3 months with Dr. Loanne Drilling

## 2020-07-08 NOTE — Assessment & Plan Note (Signed)
-   Cough significantly improved on ICS/LABA - Pulmonary function testing showed mild restrictive lung disease with no BD response. Diffusion capacity was normal, she had CT imaging in December 2021 that showed normal parenchyma. Eos absolute 400, IgE was 40. FENO 9.  - Recommend continuing Symbicort 160 1-2 puffs twice daily - Advised patient continue Singulair, Xyzal, flonase nasal spray and Prilosec  - FU in 3 months with Dr. Loanne Drilling

## 2020-07-08 NOTE — Progress Notes (Signed)
@Patient  ID: Christina Fox, female    DOB: September 24, 1980, 40 y.o.   MRN: [id number]  Chief Complaint  Patient presents with  . Follow-up    PFT done today. Cough is some better since the last visit. Cough mainly occurs in the am and is prod with brown sputum.  She has not had to use her albuterol. She c/o sinus pressure and stuffy nose for the past 2 days.     Referring provider: Sherald Fox., MD  HPI: 40 year old female, current everyday smoker.  Past medical history significant for hypertension, gallstone, thyromegaly, headache, anxiety disorder, non-seasonal allergic rhinitis.  Dr. Loanne Fox, she will consult on 06/02/2020 for chronic cough.  Patient has prior history of COVID.  Patient was started on Symbicort 162 puffs twice daily.  Commended to continue Xyzal, Singulair and Flonase.  Ordered for PFTs. A  07/08/2020 Patient presents today for 1 month follow-up with PFTs. She noticed some improvement in cough while taking Symbicort. She stopped about 2 weeks ago and cough recently return. Right now she is dealing with a lot of sinus congestion and pressure. She is compliant with Xyzal, Singulair and flonase. She has a lot of allergy symptoms at baseline was treated for sinusitis with this fall. Hx prior surgeries. She has seen ENT several times in the past. She has known thyromegaly and underwent ultrasound and bx in 2018. No acute complaints of shortness of breath, chest tightness or wheezing.   Testing: 11/03/16 allergy panel- Negative 05/01/2020 eosinophil absolute - 400 06/02/2020 IgE- 40 07/08/2020 FENO - 9  Pulmonary function testing: 07/08/2020 PFT- FVC 2.81 (77%), FEV1 2.24 (75%), ratio 80, TLC 88%, DLCOunc 20.71 (96%)  Imaging: 05/27/20 CTA  - No pulmonary embolism. Normal parenchyma 02/09/17 Thyoid ultrasound- left upper pole nodule previously measured up to 1.7 cm and today measures 1.1 cm. It has considerably reduced in size. It underwent biopsy 03/26/2013.   Allergies   Allergen Reactions  . Clarithromycin Hives and Itching  . Doxycycline Shortness Of Breath    Chest pain  . Dicyclomine Other (See Comments) and Rash    agitation   . Sulfasalazine Rash and Hives  . Bentyl [Dicyclomine Hcl] Other (See Comments)    DIZZINESS  . Haldol [Haloperidol] Other (See Comments)    Made her feel very jittery and agitated  . Meclizine     Chest pain  . Morphine And Related Other (See Comments)    Made her feel like she was losing her mind  . Toradol [Ketorolac Tromethamine] Other (See Comments)    Made her feel very jittery and agitated  . Macrobid [Nitrofurantoin Monohyd Macro] Rash  . Meclizine Hcl Rash  . Nitrofurantoin Rash  . Sulfa Antibiotics Hives and Rash    Immunization History  Administered Date(s) Administered  . Tdap 06/01/2012    Past Medical History:  Diagnosis Date  . Abdominal pain    "right lower quadrant pain and right lower back pain  . Anxiety   . Deviated septum 09/2011  . GERD (gastroesophageal reflux disease)   . Headache(784.0)    sinus  . History of echocardiogram    Echo 11/17: EF 55-60, no RWMA, normal diastolic function  . Hypertension    no meds  . Kidney stones    no current problem  . Migraine   . Nasal turbinate hypertrophy 09/2011   bilat.  . Pelvic inflammatory disease   . Urticaria     Tobacco History: Social History   Tobacco Use  Smoking Status Current Every Day Smoker  . Packs/day: 0.25  . Years: 10.00  . Pack years: 2.50  . Types: Cigarettes  Smokeless Tobacco Never Used  Tobacco Comment   2-3 cigarettes a day   Ready to quit: Not Answered Counseling given: Not Answered Comment: 2-3 cigarettes a day   Outpatient Medications Prior to Visit  Medication Sig Dispense Refill  . acetaminophen (TYLENOL) 500 MG tablet Take 500-1,000 mg by mouth every 6 (six) hours as needed for mild pain.     Marland Kitchen albuterol (VENTOLIN HFA) 108 (90 Base) MCG/ACT inhaler Inhale 2 puffs into the lungs every 6 (six)  hours as needed for wheezing or shortness of breath.    . clonazePAM (KLONOPIN) 0.5 MG tablet Take 0.5 mg by mouth 2 (two) times daily as needed for anxiety.     Marland Kitchen ibuprofen (ADVIL) 200 MG tablet Take 200 mg by mouth every 6 (six) hours as needed for moderate pain.     . Magnesium 500 MG TABS Take 1 tablet by mouth daily.    . montelukast (SINGULAIR) 10 MG tablet Take 10 mg by mouth at bedtime.    Marland Kitchen omeprazole (PRILOSEC) 40 MG capsule Take 40 mg by mouth daily.     . Potassium Chloride Crys ER (KLOR-CON M20 PO) Take 1 tablet by mouth daily.    . Prenat-Fe Poly-Methfol-FA-DHA (VITAFOL ULTRA) 29-0.6-0.4-200 MG CAPS Take 1 capsule by mouth daily before breakfast. 30 capsule 11  . Vitamin D, Ergocalciferol, (DRISDOL) 1.25 MG (50000 UNIT) CAPS capsule Take 50,000 Units by mouth See admin instructions. Twice weekly    . amoxicillin (AMOXIL) 500 MG capsule Take 500 mg by mouth 3 (three) times daily.     No facility-administered medications prior to visit.    Review of Systems  Review of Systems  Constitutional: Negative.   HENT: Positive for sinus pressure and sinus pain.   Respiratory: Positive for cough.   Cardiovascular: Negative.   Gastrointestinal:       Bloating   Physical Exam  BP 124/86 (BP Location: Left Arm, Cuff Size: Normal)   Pulse 89   Temp 98.3 F (36.8 C) (Temporal)   Ht 5' 3.5" (1.613 m)   Wt 203 lb 12.8 oz (92.4 kg)   SpO2 99% Comment: on RA  BMI 35.54 kg/m  Physical Exam Constitutional:      Appearance: Normal appearance.  Neck:     Thyroid: Thyromegaly present. No thyroid mass or thyroid tenderness.  Cardiovascular:     Rate and Rhythm: Normal rate and regular rhythm.  Pulmonary:     Effort: Pulmonary effort is normal.     Breath sounds: Normal breath sounds. No wheezing.  Musculoskeletal:     Cervical back: Normal range of motion.  Lymphadenopathy:     Cervical: No cervical adenopathy.  Skin:    General: Skin is warm and dry.  Neurological:      General: No focal deficit present.     Mental Status: She is alert and oriented to person, place, and time. Mental status is at baseline.  Psychiatric:        Mood and Affect: Mood normal.        Behavior: Behavior normal.        Thought Content: Thought content normal.        Judgment: Judgment normal.      Lab Results:  CBC    Component Value Date/Time   WBC 7.3 06/17/2020 0007   RBC 4.36 06/17/2020 0007  HGB 13.2 06/17/2020 0007   HGB 12.6 03/29/2016 1703   HCT 39.6 06/17/2020 0007   HCT 38.3 03/29/2016 1703   PLT 319 06/17/2020 0007   PLT 347 03/29/2016 1703   MCV 90.8 06/17/2020 0007   MCV 88 03/29/2016 1703   MCH 30.3 06/17/2020 0007   MCHC 33.3 06/17/2020 0007   RDW 13.1 06/17/2020 0007   RDW 13.0 03/29/2016 1703   LYMPHSABS 1.6 05/01/2020 1559   MONOABS 0.9 05/01/2020 1559   EOSABS 0.4 05/01/2020 1559   BASOSABS 0.1 05/01/2020 1559    BMET    Component Value Date/Time   NA 138 06/17/2020 0007   NA 140 08/31/2016 1637   K 2.9 (L) 06/17/2020 0007   CL 103 06/17/2020 0007   CO2 25 06/17/2020 0007   GLUCOSE 103 (H) 06/17/2020 0007   BUN 7 06/17/2020 0007   BUN 7 08/31/2016 1637   CREATININE 0.72 06/17/2020 0007   CREATININE 0.74 01/28/2016 1431   CALCIUM 9.6 06/17/2020 0007   GFRNONAA >60 06/17/2020 0007   GFRNONAA >89 11/24/2015 1644   GFRAA >60 03/26/2020 1830   GFRAA >89 11/24/2015 1644    BNP No results found for: BNP  ProBNP No results found for: PROBNP  Imaging: CT Renal Stone Study  Result Date: 06/17/2020 CLINICAL DATA:  Flank pain with kidney stone suspected EXAM: CT ABDOMEN AND PELVIS WITHOUT CONTRAST TECHNIQUE: Multidetector CT imaging of the abdomen and pelvis was performed following the standard protocol without IV contrast. COMPARISON:  05/01/2020 FINDINGS: Lower chest:  No contributory findings. Hepatobiliary: No focal liver abnormality.Cholecystectomy without bile duct dilatation. Pancreas: Unremarkable. Spleen: Unremarkable.  Adrenals/Urinary Tract: Negative adrenals. No hydronephrosis or stone. Unremarkable bladder. Stomach/Bowel: No obstruction. No appendicitis. One or 2 sigmoid diverticula. Vascular/Lymphatic: No acute vascular abnormality. No mass or adenopathy. Reproductive:No pathologic findings. Physiologic appearance of the ovaries. Other: No ascites or pneumoperitoneum. Small fatty umbilical hernia. Musculoskeletal: No acute abnormalities. IMPRESSION: 1. No acute finding or explanation for pain. 2. Small fatty umbilical hernia. 3. Punctate left renal calculus. Electronically Signed   By: Monte Fantasia M.D.   On: 06/17/2020 06:38     Assessment & Plan:   Upper airway cough syndrome - Cough significantly improved on ICS/LABA - Pulmonary function testing showed mild restrictive lung disease with no BD response. Diffusion capacity was normal, she had CT imaging in December 2021 that showed normal parenchyma. Eos absolute 400, IgE was 40. FENO 9.  - Recommend continuing Symbicort 160 1-2 puffs twice daily - Advised patient continue Singulair, Xyzal, flonase nasal spray and Prilosec  - FU in 3 months with Dr. Dorris Carnes - Patient underwent thyroid ultrasound in 2018 that showed left upper pole nodule reduced in size. She underwent bx on 03/26/13. Pathology results not available in Epic for review. Recommend regular fu with PCP.    Martyn Ehrich, NP 07/08/2020

## 2020-07-08 NOTE — Progress Notes (Signed)
PFT done today. 

## 2020-07-10 ENCOUNTER — Telehealth: Payer: Medicaid Other | Admitting: Nurse Practitioner

## 2020-07-10 DIAGNOSIS — R0981 Nasal congestion: Secondary | ICD-10-CM

## 2020-07-10 NOTE — Progress Notes (Signed)
We are sorry you are not feeling well.  Here is how we plan to help!  Based on what you have shared with me, it looks like you may have a viral upper respiratory infection.  Upper respiratory infections are caused by a large number of viruses; however, rhinovirus is the most common cause.   Symptoms vary from person to person, with common symptoms including sore throat, cough, fatigue or lack of energy and feeling of general discomfort.  A low-grade fever of up to 100.4 may present, but is often uncommon.  Symptoms vary however, and are closely related to a person's age or underlying illnesses.  The most common symptoms associated with an upper respiratory infection are nasal discharge or congestion, cough, sneezing, headache and pressure in the ears and face.  These symptoms usually persist for about 3 to 10 days, but can last up to 2 weeks.  It is important to know that upper respiratory infections do not cause serious illness or complications in most cases.    Upper respiratory infections can be transmitted from person to person, with the most common method of transmission being a person's hands.  The virus is able to live on the skin and can infect other persons for up to 2 hours after direct contact.  Also, these can be transmitted when someone coughs or sneezes; thus, it is important to cover the mouth to reduce this risk.  To keep the spread of the illness at [place], good hand hygiene is very important.  This is an infection that is most likely caused by a virus. There are no specific treatments other than to help you with the symptoms until the infection runs its course.  We are sorry you are not feeling well.  Here is how we plan to help!   For nasal congestion, you may use an oral decongestants such as Mucinex D or if you have glaucoma or high blood pressure use plain Mucinex.  Saline nasal spray or nasal drops can help and can safely be used as often as needed for congestion.  For your congestion,  I have prescribed Fluticasone nasal spray one spray in each nostril twice a day  If you do not have a history of heart disease, hypertension, diabetes or thyroid disease, prostate/bladder issues or glaucoma, you may also use Sudafed to treat nasal congestion.  It is highly recommended that you consult with a pharmacist or your primary care physician to ensure this medication is safe for you to take.     If you have a cough, you may use cough suppressants such as Delsym and Robitussin.  If you have glaucoma or high blood pressure, you can also use Coricidin HBP.    If you have a sore or scratchy throat, use a saltwater gargle-  to  teaspoon of salt dissolved in a 4-ounce to 8-ounce glass of warm water.  Gargle the solution for approximately 15-30 seconds and then spit.  It is important not to swallow the solution.  You can also use throat lozenges/cough drops and Chloraseptic spray to help with throat pain or discomfort.  Warm or cold liquids can also be helpful in relieving throat pain.  For headache, pain or general discomfort, you can use Ibuprofen or Tylenol as directed.   Some authorities believe that zinc sprays or the use of Echinacea may shorten the course of your symptoms.   HOME CARE . Only take medications as instructed by your medical team. . Be sure to drink plenty  of fluids. Water is fine as well as fruit juices, sodas and electrolyte beverages. You may want to stay away from caffeine or alcohol. If you are nauseated, try taking small sips of liquids. How do you know if you are getting enough fluid? Your urine should be a pale yellow or almost colorless. . Get rest. . Taking a steamy shower or using a humidifier may help nasal congestion and ease sore throat pain. You can place a towel over your head and breathe in the steam from hot water coming from a faucet. . Using a saline nasal spray works much the same way. . Cough drops, hard candies and sore throat lozenges may ease your  cough. . Avoid close contacts especially the very young and the elderly . Cover your mouth if you cough or sneeze . Always remember to wash your hands.   GET HELP RIGHT AWAY IF: . You develop worsening fever. . If your symptoms do not improve within 10 days . You develop yellow or green discharge from your nose over 3 days. . You have coughing fits . You develop a severe head ache or visual changes. . You develop shortness of breath, difficulty breathing or start having chest pain . Your symptoms persist after you have completed your treatment plan  MAKE SURE YOU   Understand these instructions.  Will watch your condition.  Will get help right away if you are not doing well or get worse.  Your e-visit answers were reviewed by a board certified advanced clinical practitioner to complete your personal care plan. Depending upon the condition, your plan could have included both over the counter or prescription medications. Please review your pharmacy choice. If there is a problem, you may call our nursing hot line at and have the prescription routed to another pharmacy. Your safety is important to [place]. If you have drug allergies check your prescription carefully.   You can use MyChart to ask questions about today's visit, request a non-urgent call back, or ask for a work or school excuse for 24 hours related to this e-Visit. If it has been greater than 24 hours you will need to follow up with your provider, or enter a new e-Visit to address those concerns. You will get an e-mail in the next two days asking about your experience.  I hope that your e-visit has been valuable and will speed your recovery. Thank you for using e-visits.   5-10 minutes spent reviewing and documenting in chart.

## 2020-07-14 NOTE — Progress Notes (Signed)
Called patient for results.  07/08/20 No obstructive defect present. Mildly reduced FVC and FEV1 likely due to poor effort in setting of normal TLC. Normal gas exchange.  Advised to continue Symbicort PRN since her cough has improved. She did report waiting on covid test recently due to body aches, congestion and malaise. She is unvaccinated. Advised her to quarantine while awaiting results and proper hand hygiene and masking. Recommended using inhaler as directed if symptoms worsen.

## 2020-07-20 ENCOUNTER — Telehealth: Payer: Medicaid Other | Admitting: Emergency Medicine

## 2020-07-20 DIAGNOSIS — R300 Dysuria: Secondary | ICD-10-CM | POA: Diagnosis not present

## 2020-07-20 MED ORDER — PHENAZOPYRIDINE HCL 95 MG PO TABS: 95.0000 mg | ORAL_TABLET | Freq: Three times a day (TID) | ORAL | 0 refills | Status: DC | PRN

## 2020-07-20 MED ORDER — CEPHALEXIN 500 MG PO CAPS
500.0000 mg | ORAL_CAPSULE | Freq: Two times a day (BID) | ORAL | 0 refills | Status: DC
Start: 2020-07-20 — End: 2020-08-03

## 2020-07-20 NOTE — Addendum Note (Signed)
Addended by: Montine Circle B on: 07/20/2020 10:31 AM   Modules accepted: Orders

## 2020-07-20 NOTE — Progress Notes (Signed)

## 2020-07-20 NOTE — Addendum Note (Signed)
Addended by: Montine Circle B on: 07/20/2020 12:24 PM   Modules accepted: Orders

## 2020-07-23 ENCOUNTER — Other Ambulatory Visit: Payer: Self-pay

## 2020-07-23 ENCOUNTER — Encounter: Payer: Self-pay | Admitting: Obstetrics

## 2020-07-23 ENCOUNTER — Ambulatory Visit (INDEPENDENT_AMBULATORY_CARE_PROVIDER_SITE_OTHER): Payer: Medicaid Other | Admitting: Obstetrics

## 2020-07-23 ENCOUNTER — Other Ambulatory Visit (HOSPITAL_COMMUNITY)
Admission: RE | Admit: 2020-07-23 | Discharge: 2020-07-23 | Disposition: A | Discharge status: Home / Self Care | Payer: Medicaid Other | Source: Ambulatory Visit | Attending: Obstetrics | Admitting: Obstetrics

## 2020-07-23 VITALS — BP 144/91 | HR 90 | Ht 64.0 in | Wt 204.1 lb

## 2020-07-23 DIAGNOSIS — R140 Abdominal distension (gaseous): Secondary | ICD-10-CM

## 2020-07-23 DIAGNOSIS — Z01419 Encounter for gynecological examination (general) (routine) without abnormal findings: Secondary | ICD-10-CM

## 2020-07-23 DIAGNOSIS — N898 Other specified noninflammatory disorders of vagina: Secondary | ICD-10-CM | POA: Insufficient documentation

## 2020-07-23 DIAGNOSIS — Z30011 Encounter for initial prescription of contraceptive pills: Secondary | ICD-10-CM

## 2020-07-23 DIAGNOSIS — Z3009 Encounter for other general counseling and advice on contraception: Secondary | ICD-10-CM

## 2020-07-23 DIAGNOSIS — N9489 Other specified conditions associated with female genital organs and menstrual cycle: Secondary | ICD-10-CM

## 2020-07-23 DIAGNOSIS — N946 Dysmenorrhea, unspecified: Secondary | ICD-10-CM

## 2020-07-23 DIAGNOSIS — Z720 Tobacco use: Secondary | ICD-10-CM

## 2020-07-23 DIAGNOSIS — E669 Obesity, unspecified: Secondary | ICD-10-CM

## 2020-07-23 MED ORDER — IBUPROFEN 800 MG PO TABS: 800.0000 mg | ORAL_TABLET | Freq: Three times a day (TID) | ORAL | 5 refills | Status: DC | PRN

## 2020-07-23 MED ORDER — LO LOESTRIN FE 1 MG-10 MCG / 10 MCG PO TABS: 1.0000 | ORAL_TABLET | Freq: Every day | ORAL | 11 refills | Status: DC

## 2020-07-23 MED ORDER — SLYND 4 MG PO TABS
1.0000 | ORAL_TABLET | Freq: Every day | ORAL | 11 refills | Status: DC
Start: 2020-07-23 — End: 2020-12-04

## 2020-07-23 NOTE — Progress Notes (Signed)
Pt is in the office for annual exam Last pap 10/13/2017 Reports that she had a mammogram a few months ago at [facility] imaging. LMP 07/06/2020 Pt reports that she had a SAB in 2021 and she has been having bloating with her cycles and occasional sharp pelvic pains since then. Pt reports occasional vaginal discharge.

## 2020-07-23 NOTE — Progress Notes (Signed)
Subjective:        Christina Fox is a 40 y.o. female here for a routine exam.  Current complaints: Bloating and cramping around period time.  Vaginal discharge.  Personal health questionnaire:  Is patient [name], have a family history of breast and/or ovarian cancer: no Is there a family history of uterine cancer diagnosed at age < 29, gastrointestinal cancer, urinary tract cancer, family member who is a Field seismologist syndrome-associated carrier: yes Is the patient overweight and hypertensive, family history of diabetes, personal history of gestational diabetes, preeclampsia or PCOS: no Is patient over 63, have PCOS,  family history of premature CHD under age 47, diabetes, smoke, have hypertension or peripheral artery disease:  no At any time, has a partner hit, kicked or otherwise hurt or frightened you?: no Over the past 2 weeks, have you felt down, depressed or hopeless?: no Over the past 2 weeks, have you felt little interest or pleasure in doing things?:no   Gynecologic History Patient's last menstrual period was 07/06/2020. Contraception: none Last Pap: 2019. Results were: normal Last mammogram: 2022. Results were: normal  Obstetric History OB History  Gravida Para Term Preterm AB Living  5 4 4   1 4   SAB IAB Ectopic Multiple Live Births  1       4    # Outcome Date GA Lbr Len/2nd Weight Sex Delivery Anes PTL Lv  5 SAB 08/2019          4 Term 12/08/11    F Vag-Spont   LIV  3 Term 12/17/01    M Vag-Spont   LIV  2 Term 10/08/00    M Vag-Spont   LIV  1 Term 07/27/98    F Vag-Spont   LIV    Obstetric Comments  1st Menstrual Cycle:  10   1st Pregnancy:  16    Past Medical History:  Diagnosis Date  . Abdominal pain    "right lower quadrant pain and right lower back pain  . Anxiety   . Deviated septum 09/2011  . GERD (gastroesophageal reflux disease)   . Headache(784.0)    sinus  . History of echocardiogram    Echo 11/17: EF 55-60, no RWMA, normal diastolic  function  . Hypertension    no meds  . Kidney stones    no current problem  . Migraine   . Nasal turbinate hypertrophy 09/2011   bilat.  . Pelvic inflammatory disease   . Urticaria     Past Surgical History:  Procedure Laterality Date  . CHOLECYSTECTOMY N/A 01/15/2016   Procedure: LAPAROSCOPIC CHOLECYSTECTOMY WITH INTRAOPERATIVE CHOLANGIOGRAM;  Surgeon: Christene Lye, MD;  Location: [facility];  Service: General;  Laterality: N/A;  . COLONOSCOPY  2007   [facility]: pedunculated 54mm polyp in distal sigmoid, sessile 64mm polyp at splenic flexure, larger polyp tubulovillous adenoma and smaller polyp tubular adenoma   . COLONOSCOPY  09-09-15  . COLONOSCOPY WITH PROPOFOL N/A 03/01/2016   Procedure: COLONOSCOPY WITH PROPOFOL;  Surgeon: Garlan Fair, MD;  Location: [facility];  Service: Endoscopy;  Laterality: N/A;  . NASAL SEPTOPLASTY W/ TURBINOPLASTY  10/04/2011   Procedure: NASAL SEPTOPLASTY WITH TURBINATE REDUCTION;  Surgeon: Ascencion Dike, MD;  Location: [facility];  Service: ENT;  Laterality: Bilateral;  . WISDOM TOOTH EXTRACTION  2009     Current Outpatient Medications:  .  Drospirenone (SLYND) 4 MG TABS, Take 1 tablet by mouth daily., Disp: 28 tablet, Rfl: 11 .  ibuprofen (  ADVIL) 800 MG tablet, Take 1 tablet (800 mg total) by mouth every 8 (eight) hours as needed., Disp: 30 tablet, Rfl: 5 .  acetaminophen (TYLENOL) 500 MG tablet, Take 500-1,000 mg by mouth every 6 (six) hours as needed for mild pain. , Disp: , Rfl:  .  albuterol (VENTOLIN HFA) 108 (90 Base) MCG/ACT inhaler, Inhale 2 puffs into the lungs every 6 (six) hours as needed for wheezing or shortness of breath., Disp: , Rfl:  .  cephALEXin (KEFLEX) 500 MG capsule, Take 1 capsule (500 mg total) by mouth 2 (two) times daily., Disp: 14 capsule, Rfl: 0 .  clonazePAM (KLONOPIN) 0.5 MG tablet, Take 0.5 mg by mouth 2 (two) times daily as needed for anxiety. , Disp: , Rfl:  .  Magnesium 500 MG TABS, Take 1 tablet by  mouth daily., Disp: , Rfl:  .  montelukast (SINGULAIR) 10 MG tablet, Take 10 mg by mouth at bedtime., Disp: , Rfl:  .  omeprazole (PRILOSEC) 40 MG capsule, Take 40 mg by mouth daily. , Disp: , Rfl:  .  phenazopyridine (PYRIDIUM) 95 MG tablet, Take 1 tablet (95 mg total) by mouth 3 (three) times daily as needed for pain., Disp: 10 tablet, Rfl: 0 .  Potassium Chloride Crys ER (KLOR-CON M20 PO), Take 1 tablet by mouth daily., Disp: , Rfl:  .  Vitamin D, Ergocalciferol, (DRISDOL) 1.25 MG (50000 UNIT) CAPS capsule, Take 50,000 Units by mouth See admin instructions. Twice weekly, Disp: , Rfl:  Allergies  Allergen Reactions  . Clarithromycin Hives and Itching  . Doxycycline Shortness Of Breath    Chest pain  . Dicyclomine Other (See Comments) and Rash    agitation   . Sulfasalazine Rash and Hives  . Bentyl [Dicyclomine Hcl] Other (See Comments)    DIZZINESS  . Haldol [Haloperidol] Other (See Comments)    Made her feel very jittery and agitated  . Meclizine     Chest pain  . Morphine And Related Other (See Comments)    Made her feel like she was losing her mind  . Toradol [Ketorolac Tromethamine] Other (See Comments)    Made her feel very jittery and agitated  . Macrobid [Nitrofurantoin Monohyd Macro] Rash  . Meclizine Hcl Rash  . Nitrofurantoin Rash  . Sulfa Antibiotics Hives and Rash    Social History   Tobacco Use  . Smoking status: Current Every Day Smoker    Packs/day: 0.25    Years: 10.00    Pack years: 2.50    Types: Cigarettes  . Smokeless tobacco: Never Used  . Tobacco comment: 2-3 cigarettes a day  Substance Use Topics  . Alcohol use: Yes    Alcohol/week: 0.0 standard drinks    Comment: ocassionally    Family History  Problem Relation Age of Onset  . Kidney cancer Mother   . Hypertension Mother   . Migraines Mother   . Multiple sclerosis Mother   . Fibromyalgia Mother   . Liver disease Mother        NASH   . Hypertension Father   . Liver disease Father         liver transplant; had fatty liver disease  . Spina bifida Brother   . Migraines Maternal Grandmother   . Hypertension Other   . Colon cancer Paternal Grandfather   . Allergic rhinitis Daughter   . Asthma Daughter   . Allergic rhinitis Son   . Asthma Son   . Angioedema Neg Hx   . Eczema Neg  Hx   . Immunodeficiency Neg Hx   . Urticaria Neg Hx       Review of Systems  Constitutional: negative for fatigue and weight loss Respiratory: negative for cough and wheezing Cardiovascular: negative for chest pain, fatigue and palpitations Gastrointestinal: positive for abdominal bloating and pain and negative for change in bowel habits Musculoskeletal:negative for myalgias Neurological: negative for gait problems and tremors Behavioral/Psych: negative for abusive relationship, depression Endocrine: negative for temperature intolerance    Genitourinary:negative for abnormal menstrual periods, genital lesions, hot flashes, sexual problems.  Positive for vaginal discharge Integument/breast: negative for breast lump, breast tenderness, nipple discharge and skin lesion(s)    Objective:       BP (!) 144/91   Pulse 90   Ht 5\' 4"  (1.626 m)   Wt 204 lb 1.6 oz (92.6 kg)   LMP 07/06/2020   BMI 35.03 kg/m  General:   alert and no distress  Skin:   no rash or abnormalities  Lungs:   clear to auscultation bilaterally  Heart:   regular rate and rhythm, S1, S2 normal, no murmur, click, rub or gallop  Breasts:   normal without suspicious masses, skin or nipple changes or axillary nodes  Abdomen:  normal findings: no organomegaly, soft, non-tender and no hernia  Pelvis:  External genitalia: normal general appearance Urinary system: urethral meatus normal and bladder without fullness, nontender Vaginal: normal without tenderness, induration or masses Cervix: normal appearance Adnexa: normal bimanual exam Uterus: anteverted and non-tender, normal size   Lab Review Urine pregnancy test Labs  reviewed yes Radiologic studies reviewed yes  50% of 20 min visit spent on counseling and coordination of care.   Assessment:     1. Encounter for routine gynecological examination with Papanicolaou smear of cervix Rx: - Cytology - PAP( [facility])  2. Vaginal discharge Rx: - Cervicovaginal ancillary only( [facility])  3. Abdominal bloating associated with menstruation Rx: - US PELVIC COMPLETE WITH TRANSVAGINAL; Future  4. Dysmenorrhea Rx: - ibuprofen (ADVIL) 800 MG tablet; Take 1 tablet (800 mg total) by mouth every 8 (eight) hours as needed.  Dispense: 30 tablet; Refill: 5  5. Encounter for counseling regarding contraception - wants OCP's - she smokes occasionally and I explained the risks of smoking and taking OCP's, including clots, strokes, etc  6. Encounter for initial prescription of contraceptive pills - Progestin Only Rx: - [facility] Rx  7. Tobacco consumption - she plans to quit smoking - she understands that taking OCP's is contraindicated if she smokes - cessation with the aid of medication and behavioral modification recommended  8. Obesity (BMI 35.0-39.9 without comorbidity) - program of caloric reduction, exercise and behavioral modification recommended   Plan:    Education reviewed: calcium supplements, depression evaluation, low fat, low cholesterol diet, safe sex/STD prevention, self breast exams, skin cancer screening, smoking cessation and weight bearing exercise. Contraception: OCP (estrogen/progesterone). Follow up in: 2 weeks.   Meds ordered this encounter  Medications  . ibuprofen (ADVIL) 800 MG tablet    Sig: Take 1 tablet (800 mg total) by mouth every 8 (eight) hours as needed.    Dispense:  30 tablet    Refill:  5  . DISCONTD: LO LOESTRIN FE 1 MG-10 MCG / 10 MCG tablet    Sig: Take 1 tablet by mouth daily.    Dispense:  28 tablet    Refill:  11    Submit other coverage code 3  BIN:  [id number]  PCN:  [id number]  GRP:  [id number]   [id number]   . Drospirenone (SLYND) 4 MG TABS    Sig: Take 1 tablet by mouth daily.    Dispense:  28 tablet    Refill:  11   Orders Placed This Encounter  Procedures  . US PELVIC COMPLETE WITH TRANSVAGINAL    Standing Status:   Future    Standing Expiration Date:   07/23/2021    Order Specific Question:   Reason for Exam (SYMPTOM  OR DIAGNOSIS REQUIRED)    Answer:   Bloating.  Pelvic pain.    Order Specific Question:   Preferred imaging location?    Answer:   [place]    Shelly Bombard, MD 07/23/2020 3:50 PM

## 2020-07-24 LAB — CERVICOVAGINAL ANCILLARY ONLY
Bacterial Vaginitis (gardnerella): NEGATIVE
Candida Glabrata: NEGATIVE
Candida Vaginitis: POSITIVE — AB
Chlamydia: NEGATIVE
Comment: NEGATIVE
Comment: NEGATIVE
Comment: NEGATIVE
Comment: NEGATIVE
Comment: NEGATIVE
Comment: NORMAL
Neisseria Gonorrhea: NEGATIVE
Trichomonas: NEGATIVE

## 2020-07-27 ENCOUNTER — Other Ambulatory Visit: Payer: Self-pay | Admitting: Obstetrics

## 2020-07-27 DIAGNOSIS — B373 Candidiasis of vulva and vagina: Secondary | ICD-10-CM

## 2020-07-27 DIAGNOSIS — B3731 Acute candidiasis of vulva and vagina: Secondary | ICD-10-CM

## 2020-07-27 LAB — CYTOLOGY - PAP
Comment: NEGATIVE
Diagnosis: NEGATIVE
High risk HPV: NEGATIVE

## 2020-07-27 MED ORDER — FLUCONAZOLE 150 MG PO TABS: 150.0000 mg | ORAL_TABLET | Freq: Once | ORAL | 0 refills | Status: AC

## 2020-07-31 ENCOUNTER — Ambulatory Visit
Admission: RE | Admit: 2020-07-31 | Discharge: 2020-07-31 | Disposition: A | Discharge status: Home / Self Care | Payer: Medicaid Other | Source: Ambulatory Visit | Attending: Obstetrics | Admitting: Obstetrics

## 2020-07-31 ENCOUNTER — Other Ambulatory Visit: Payer: Self-pay

## 2020-07-31 DIAGNOSIS — N9489 Other specified conditions associated with female genital organs and menstrual cycle: Secondary | ICD-10-CM | POA: Diagnosis not present

## 2020-07-31 DIAGNOSIS — R140 Abdominal distension (gaseous): Secondary | ICD-10-CM | POA: Diagnosis present

## 2020-08-03 ENCOUNTER — Telehealth: Payer: Medicaid Other | Admitting: Physician Assistant

## 2020-08-03 DIAGNOSIS — B9689 Other specified bacterial agents as the cause of diseases classified elsewhere: Secondary | ICD-10-CM | POA: Diagnosis not present

## 2020-08-03 DIAGNOSIS — J019 Acute sinusitis, unspecified: Secondary | ICD-10-CM

## 2020-08-03 MED ORDER — AMOXICILLIN-POT CLAVULANATE 875-125 MG PO TABS: 1.0000 | ORAL_TABLET | Freq: Two times a day (BID) | ORAL | 0 refills | Status: DC

## 2020-08-03 NOTE — Progress Notes (Signed)

## 2020-08-03 NOTE — Progress Notes (Signed)
Message sent to patient for clarification regarding her COVID test results. Awaiting response.

## 2020-08-03 NOTE — Progress Notes (Signed)
I have spent 5 minutes in review of e-visit questionnaire, review and updating patient chart, medical decision making and response to patient.   Adib [place]ah[place] Jersee Winiarski, PA-C    

## 2020-08-08 ENCOUNTER — Telehealth: Payer: Medicaid Other | Admitting: Nurse Practitioner

## 2020-08-08 DIAGNOSIS — B3731 Acute candidiasis of vulva and vagina: Secondary | ICD-10-CM

## 2020-08-08 DIAGNOSIS — B373 Candidiasis of vulva and vagina: Secondary | ICD-10-CM

## 2020-08-08 MED ORDER — FLUCONAZOLE 150 MG PO TABS: 150.0000 mg | ORAL_TABLET | Freq: Once | ORAL | 0 refills | Status: AC

## 2020-08-08 NOTE — Progress Notes (Signed)
We are sorry that you are not feeling well. Here is how we plan to help! Based on what you shared with me it looks like you: May have a yeast vaginosis  Vaginosis is an inflammation of the vagina that can result in discharge, itching and pain. The cause is usually a change in the normal balance of vaginal bacteria or an infection. Vaginosis can also result from reduced estrogen levels after menopause.  The most common causes of vaginosis are:   Bacterial vaginosis which results from an overgrowth of one on several organisms that are normally present in your vagina.   Yeast infections which are caused by a naturally occurring fungus called candida.   Vaginal atrophy (atrophic vaginosis) which results from the thinning of the vagina from reduced estrogen levels after menopause.   Trichomoniasis which is caused by a parasite and is commonly transmitted by sexual intercourse.  Factors that increase your risk of developing vaginosis include: . Medications, such as antibiotics and steroids . Uncontrolled diabetes . Use of hygiene products such as bubble bath, vaginal spray or vaginal deodorant . Douching . Wearing damp or tight-fitting clothing . Using an intrauterine device (IUD) for birth control . Hormonal changes, such as those associated with pregnancy, birth control pills or menopause . Sexual activity . Having a sexually transmitted infection  Your treatment plan is A single Diflucan (fluconazole) 150mg tablet once.  I have electronically sent this prescription into the pharmacy that you have chosen.  Be sure to take all of the medication as directed. Stop taking any medication if you develop a rash, tongue swelling or shortness of breath. Mothers who are breast feeding should consider pumping and discarding their breast milk while on these antibiotics. However, there is no consensus that infant exposure at these doses would be harmful.  Remember that medication creams can weaken latex  condoms. .   HOME CARE:  Good hygiene may prevent some types of vaginosis from recurring and may relieve some symptoms:  . Avoid baths, hot tubs and whirlpool spas. Rinse soap from your outer genital area after a shower, and dry the area well to prevent irritation. Don't use scented or harsh soaps, such as those with deodorant or antibacterial action. . Avoid irritants. These include scented tampons and pads. . Wipe from front to back after using the toilet. Doing so avoids spreading fecal bacteria to your vagina.  Other things that may help prevent vaginosis include:  . Don't douche. Your vagina doesn't require cleansing other than normal bathing. Repetitive douching disrupts the normal organisms that reside in the vagina and can actually increase your risk of vaginal infection. Douching won't clear up a vaginal infection. . Use a latex condom. Both female and female latex condoms may help you avoid infections spread by sexual contact. . Wear cotton underwear. Also wear pantyhose with a cotton crotch. If you feel comfortable without it, skip wearing underwear to bed. Yeast thrives in moist environments Your symptoms should improve in the next day or two.  GET HELP RIGHT AWAY IF:  . You have pain in your lower abdomen ( pelvic area or over your ovaries) . You develop nausea or vomiting . You develop a fever . Your discharge changes or worsens . You have persistent pain with intercourse . You develop shortness of breath, a rapid pulse, or you faint.  These symptoms could be signs of problems or infections that need to be evaluated by a medical provider now.  MAKE SURE YOU      Understand these instructions.  Will watch your condition.  Will get help right away if you are not doing well or get worse.  Your e-visit answers were reviewed by a board certified advanced clinical practitioner to complete your personal care plan. Depending upon the condition, your plan could have included  both over the counter or prescription medications. Please review your pharmacy choice to make sure that you have choses a pharmacy that is open for you to pick up any needed prescription, Your safety is important to [place]. If you have drug allergies check your prescription carefully.   You can use MyChart to ask questions about today's visit, request a non-urgent call back, or ask for a work or school excuse for 24 hours related to this e-Visit. If it has been greater than 24 hours you will need to follow up with your provider, or enter a new e-Visit to address those concerns. You will get a MyChart message within the next two days asking about your experience. I hope that your e-visit has been valuable and will speed your recovery.  5-10 minutes spent reviewing and documenting in chart.

## 2020-08-21 ENCOUNTER — Telehealth: Payer: Medicaid Other | Admitting: Physician Assistant

## 2020-08-21 DIAGNOSIS — K5900 Constipation, unspecified: Secondary | ICD-10-CM | POA: Diagnosis not present

## 2020-08-21 DIAGNOSIS — K649 Unspecified hemorrhoids: Secondary | ICD-10-CM

## 2020-08-21 MED ORDER — HYDROCORTISONE ACETATE 25 MG RE SUPP: 25.0000 mg | Freq: Two times a day (BID) | RECTAL | 0 refills | Status: DC

## 2020-08-21 NOTE — Progress Notes (Signed)
E-Visit for Hemorrhoid  We are sorry that you are not feeling well. We are here to help!  Hemorrhoids are swollen veins in the rectum. They can cause itching, bleeding, and pain. Hemorrhoids are very common.  In some cases, you can see or feel hemorrhoids around the outside of the rectum. In other cases, you cannot see them because they are hidden inside the rectum. Be patient - It can take months for this to improve or go away.   Hemorrhoids do not always cause symptoms. But when they do, symptoms can include: ?Itching of the skin around the anus ?Bleeding - Bleeding is usually painless. You might see bright red blood after using the toilet. ?Pain - If a blood clot forms inside a hemorrhoid, this can cause pain. It can also cause a lump that you might be able to feel.   What can I do to keep from getting more hemorrhoids? -- The most important thing you can do is to keep from getting constipated. You should have a bowel movement at least a few times a week. When you have a bowel movement, you also should not have to push too much. Plus, your bowel movements should not be too hard. Being constipated and having hard bowel movements can make hemorrhoids worse.   I have prescribed Anusol HC suppositories.  Insert into rectum twice per day for 6 days  HOME CARE: . Sitz Baths twice daily. Soak buttocks in 2 or 3 inches of warm water for 10 to 15 minutes. Do not add soap, bubble bath, or anything to the water. . Stool softener such as Colace 100 mg twice daily AND Miralax 1 scoop daily until you have regular soft stools . Over the counter Preparation H . Tucks Pads . Witch Hazel  Here are some steps you can take to avoid getting constipated or having hard stools:  ?Eat lots of fruits, vegetables, and other foods with fiber. Fiber helps to increase bowel movements. If you do not get enough fiber from your diet, you can take fiber supplements. These come in the form of powders, wafers, or pills.  Some examples are Metamucil, Citrucel, Benefiber and FiberCon. If you take a fiber supplement, be sure to read the label so you know how much to take. If you're not sure, ask your provider or nurse. ?Take medicines called "stool softeners" such as docusate sodium (sample brand names: Colace, Dulcolax). These medicines increase the number of bowel movements you have. They are safe to take and they can prevent problems later.  You should request a referral for a follow up evaluation with a Gastroenterologist (GI doctor) to evaluate this chronic and relapsing condition - even if it improves to see what further steps need to be taken. This is highly linked to chronic constipation and straining to have a bowel movement. It may require further treatment or surgical intervention.   GET HELP RIGHT AWAY IF: . You develop severe pain . You have heavy bleeding   FOLLOW UP WITH YOUR PRIMARY PROVIDER IF: . If your symptoms do not improve within 10 days  MAKE SURE YOU   Understand these instructions.  Will watch your condition.  Will get help right away if you are not doing well or get worse.  Your e-visit answers were reviewed by a board certified advanced clinical practitioner to complete your personal care plan. Depending upon the condition, your plan could have included both over the counter or prescription medications.  Your safety is important to  [place]. If you have drug allergies check your prescription carefully.   You can use MyChart to ask questions about today's visit, request a non-urgent call back, or ask for a work or school excuse for 24 hours related to this e-Visit. If it has been greater than 24 hours you will need to follow up with your provider, or enter a new e-Visit to address those concerns.  You will get an e-mail with a link to a survey asking about your experience.  We hope that your e-visit has been valuable and will speed your recovery! Thank you for using  e-visits.      Greater than 5 minutes, yet less than 10 minutes of time have been spent researching, coordinating and implementing care for this patient today.

## 2020-08-25 ENCOUNTER — Other Ambulatory Visit: Payer: Self-pay

## 2020-08-25 ENCOUNTER — Emergency Department (HOSPITAL_BASED_OUTPATIENT_CLINIC_OR_DEPARTMENT_OTHER)
Admission: EM | Admit: 2020-08-25 | Discharge: 2020-08-25 | Disposition: A | Discharge status: Home / Self Care | Payer: Medicaid Other | Attending: Emergency Medicine | Admitting: Emergency Medicine

## 2020-08-25 ENCOUNTER — Emergency Department (HOSPITAL_BASED_OUTPATIENT_CLINIC_OR_DEPARTMENT_OTHER): Payer: Medicaid Other

## 2020-08-25 ENCOUNTER — Encounter (HOSPITAL_BASED_OUTPATIENT_CLINIC_OR_DEPARTMENT_OTHER): Payer: Self-pay | Admitting: Emergency Medicine

## 2020-08-25 DIAGNOSIS — I10 Essential (primary) hypertension: Secondary | ICD-10-CM | POA: Diagnosis not present

## 2020-08-25 DIAGNOSIS — M5414 Radiculopathy, thoracic region: Secondary | ICD-10-CM | POA: Insufficient documentation

## 2020-08-25 DIAGNOSIS — R2231 Localized swelling, mass and lump, right upper limb: Secondary | ICD-10-CM | POA: Diagnosis not present

## 2020-08-25 DIAGNOSIS — F17210 Nicotine dependence, cigarettes, uncomplicated: Secondary | ICD-10-CM | POA: Diagnosis not present

## 2020-08-25 DIAGNOSIS — M546 Pain in thoracic spine: Secondary | ICD-10-CM | POA: Diagnosis present

## 2020-08-25 MED ORDER — LIDOCAINE 5 % EX PTCH
1.0000 | MEDICATED_PATCH | CUTANEOUS | Status: DC
  Administered 2020-08-25: 1 via TRANSDERMAL
  Filled 2020-08-25: qty 1

## 2020-08-25 MED ORDER — PREDNISONE 50 MG PO TABS: 50.0000 mg | ORAL_TABLET | Freq: Every day | ORAL | 0 refills | Status: AC

## 2020-08-25 MED ORDER — METHOCARBAMOL 500 MG PO TABS: 500.0000 mg | ORAL_TABLET | Freq: Two times a day (BID) | ORAL | 0 refills | Status: DC

## 2020-08-25 MED ORDER — METHOCARBAMOL 500 MG PO TABS: 500.0000 mg | ORAL_TABLET | Freq: Two times a day (BID) | ORAL | 0 refills | Status: DC | PRN

## 2020-08-25 NOTE — ED Provider Notes (Addendum)
[facility] Provider Note   CSN: [id number] Arrival date & time: 08/25/20  1642     History Chief Complaint  Patient presents with  . Back Pain  . tingling in fingers    Christina Fox is a 40 y.o. female who presents with concern for neck and mid back pain with numbness and tingling radiating down her arms intermittently for the last week.  She does endorse being under quite a bit of stress and has history of anxiety on Klonopin.  She denies any recent trauma or change in her activity denies history of similar symptoms in the past.  Denies any weakness in her extremities, denies any history of IV drug use.  She does endorse new swelling in the right upper extremity this week.  I personally read the patient medical records.  She has history of GERD, anxiety, nephrolithiasis, hypertension.  HPI     Past Medical History:  Diagnosis Date  . Abdominal pain    "right lower quadrant pain and right lower back pain  . Anxiety   . Deviated septum 09/2011  . GERD (gastroesophageal reflux disease)   . Headache(784.0)    sinus  . History of echocardiogram    Echo 11/17: EF 55-60, no RWMA, normal diastolic function  . Hypertension    no meds  . Kidney stones    no current problem  . Migraine   . Nasal turbinate hypertrophy 09/2011   bilat.  . Pelvic inflammatory disease   . Urticaria     Patient Active Problem List   Diagnosis Date Noted  . Upper airway cough syndrome 07/08/2020  . Gallstone 01/07/2016  . History of colonic polyps 11/27/2015  . Right lower quadrant abdominal pain 11/27/2015  . HTN (hypertension), benign 11/24/2015  . Abdominal pain 11/16/2015  . Headache 10/13/2015  . 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 Anxiety disorder 11/03/2014    Past Surgical History:  Procedure Laterality Date  . CHOLECYSTECTOMY N/A 01/15/2016   Procedure: LAPAROSCOPIC CHOLECYSTECTOMY WITH INTRAOPERATIVE CHOLANGIOGRAM;  Surgeon: Christene Lye, MD;   Location: [facility];  Service: General;  Laterality: N/A;  . COLONOSCOPY  2007   [facility]: pedunculated 101mm polyp in distal sigmoid, sessile 41mm polyp at splenic flexure, larger polyp tubulovillous adenoma and smaller polyp tubular adenoma   . COLONOSCOPY  09-09-15  . COLONOSCOPY WITH PROPOFOL N/A 03/01/2016   Procedure: COLONOSCOPY WITH PROPOFOL;  Surgeon: Garlan Fair, MD;  Location: [facility];  Service: Endoscopy;  Laterality: N/A;  . NASAL SEPTOPLASTY W/ TURBINOPLASTY  10/04/2011   Procedure: NASAL SEPTOPLASTY WITH TURBINATE REDUCTION;  Surgeon: Ascencion Dike, MD;  Location: [facility];  Service: ENT;  Laterality: Bilateral;  . WISDOM TOOTH EXTRACTION  2009     OB History    Gravida  5   Para  4   Term  4   Preterm      AB  1   Living  4     SAB  1   IAB      Ectopic      Multiple      Live Births  4        Obstetric Comments  1st Menstrual Cycle:  10  1st Pregnancy:  89         Family History  Problem Relation Age of Onset  . Kidney cancer Mother   . Hypertension Mother   . Migraines Mother   . Multiple sclerosis Mother   . Fibromyalgia  Mother   . Liver disease Mother        NASH   . Hypertension Father   . Liver disease Father        liver transplant; had fatty liver disease  . Spina bifida Brother   . Migraines Maternal Grandmother   . Hypertension Other   . Colon cancer Paternal Grandfather   . Allergic rhinitis Daughter   . Asthma Daughter   . Allergic rhinitis Son   . Asthma Son   . Angioedema Neg Hx   . Eczema Neg Hx   . Immunodeficiency Neg Hx   . Urticaria Neg Hx     Social History   Tobacco Use  . Smoking status: Current Every Day Smoker    Packs/day: 0.25    Years: 10.00    Pack years: 2.50    Types: Cigarettes  . Smokeless tobacco: Never Used  . Tobacco comment: 2-3 cigarettes a day  Vaping Use  . Vaping Use: Never used  Substance Use Topics  . Alcohol use: Yes    Alcohol/week: 0.0 standard drinks     Comment: ocassionally  . Drug use: No    Home Medications Prior to Admission medications   Medication Sig Start Date End Date Taking? Authorizing Provider  predniSONE (DELTASONE) 50 MG tablet Take 1 tablet (50 mg total) by mouth daily for 5 days. 08/25/20 08/30/20 Yes Maleeka Sabatino, Gypsy Balsam, PA-C  acetaminophen (TYLENOL) 500 MG tablet Take 500-1,000 mg by mouth every 6 (six) hours as needed for mild pain.     [provider]  albuterol (VENTOLIN HFA) 108 (90 Base) MCG/ACT inhaler Inhale 2 puffs into the lungs every 6 (six) hours as needed for wheezing or shortness of breath.    [provider]  amoxicillin-clavulanate (AUGMENTIN) 875-125 MG tablet Take 1 tablet by mouth 2 (two) times daily. 08/03/20   Brunetta Jeans, PA-C  clonazePAM (KLONOPIN) 0.5 MG tablet Take 0.5 mg by mouth 2 (two) times daily as needed for anxiety.     [provider]  Drospirenone (SLYND) 4 MG TABS Take 1 tablet by mouth daily. 07/23/20   Shelly Bombard, MD  hydrocortisone (ANUSOL-HC) 25 MG suppository Place 1 suppository (25 mg total) rectally 2 (two) times daily. 08/21/20   McVey, Gelene Mink, PA-C  ibuprofen (ADVIL) 800 MG tablet Take 1 tablet (800 mg total) by mouth every 8 (eight) hours as needed. 07/23/20   Shelly Bombard, MD  Magnesium 500 MG TABS Take 1 tablet by mouth daily. 06/25/20   [provider]  methocarbamol (ROBAXIN) 500 MG tablet Take 1 tablet (500 mg total) by mouth 2 (two) times daily as needed for muscle spasms. 08/25/20   Glora Hulgan, Eugene Garnet R, PA-C  montelukast (SINGULAIR) 10 MG tablet Take 10 mg by mouth at bedtime.    [provider]  omeprazole (PRILOSEC) 40 MG capsule Take 40 mg by mouth daily.     [provider]  phenazopyridine (PYRIDIUM) 95 MG tablet Take 1 tablet (95 mg total) by mouth 3 (three) times daily as needed for pain. 07/20/20   Montine Circle, PA-C  Potassium Chloride Crys ER (KLOR-CON M20 PO) Take 1 tablet by mouth daily.  06/25/20   [provider]  Vitamin D, Ergocalciferol, (DRISDOL) 1.25 MG (50000 UNIT) CAPS capsule Take 50,000 Units by mouth See admin instructions. Twice weekly    [provider]  escitalopram (LEXAPRO) 10 MG tablet Take 10 mg by mouth daily.    09/02/11  [provider]    Allergies    Clarithromycin, Doxycycline, Dicyclomine, Sulfasalazine, Bentyl [dicyclomine hcl], Haldol [haloperidol], Meclizine, Morphine and related, Toradol [ketorolac tromethamine], Macrobid [nitrofurantoin monohyd macro], Meclizine hcl, Nitrofurantoin, and Sulfa antibiotics  Review of Systems   Review of Systems  Constitutional: Negative.   HENT: Negative.   Eyes: Negative.   Respiratory: Negative.   Cardiovascular: Negative.   Gastrointestinal: Negative.   Genitourinary: Negative.   Musculoskeletal: Positive for back pain and neck pain. Negative for neck stiffness.  Allergic/Immunologic: Negative.   Neurological: Positive for numbness. Negative for syncope and weakness.  Psychiatric/Behavioral: The patient is nervous/anxious.     Physical Exam Updated Vital Signs BP (!) 166/104 (BP Location: Right Arm)   Pulse 80   Temp 98 F (36.7 C) (Oral)   Resp 18   Ht 5\' 4"  (1.626 m)   Wt 91.2 kg   SpO2 100%   BMI 34.52 kg/m   Physical Exam Vitals and nursing note reviewed.  HENT:     Head: Normocephalic and atraumatic.     Nose: Nose normal.     Mouth/Throat:     Mouth: Mucous membranes are moist.     Pharynx: Oropharynx is clear. No oropharyngeal exudate or posterior oropharyngeal erythema.  Eyes:     General: Lids are normal.        Right eye: No discharge.        Left eye: No discharge.     Extraocular Movements: Extraocular movements intact.     Conjunctiva/sclera: Conjunctivae normal.     Pupils: Pupils are equal, round, and reactive to light.  Neck:     Trachea: Trachea and phonation normal.  Cardiovascular:     Rate and Rhythm: Normal rate and regular rhythm.      Pulses: Normal pulses.          Radial pulses are 2+ on the right side and 2+ on the left side.     Heart sounds: Normal heart sounds.  Pulmonary:     Effort: Pulmonary effort is normal. No respiratory distress.     Breath sounds: Normal breath sounds. No wheezing or rales.  Chest:     Chest wall: No deformity, swelling, tenderness or crepitus.  Abdominal:     General: Bowel sounds are normal. There is no distension.     Palpations: Abdomen is soft.     Tenderness: There is no abdominal tenderness.  Musculoskeletal:        General: No deformity.     Right shoulder: Normal.     Left shoulder: Normal.     Right upper arm: Swelling present. No edema, lacerations, tenderness or bony tenderness.     Left upper arm: Normal.     Right elbow: Normal.     Left elbow: Normal.     Right forearm: Normal.     Left forearm: Normal.     Right wrist: Normal.     Left wrist: Normal.     Right hand: Normal.     Left hand: Normal.     Cervical back: Normal range of motion and neck supple. Spasms present. No deformity, bony tenderness or crepitus. Muscular tenderness present. No pain with movement or spinous process tenderness. Normal range of motion.     Thoracic back: Spasms and tenderness present. No bony tenderness.     Lumbar back: Spasms present. No tenderness or bony tenderness. Negative right straight leg raise test and negative left straight leg raise test.     Right lower leg:  No edema.     Left lower leg: No edema.     Comments: Spasm of the cervical and thoracic paraspinous musculature, including the trapezius, right > left, with trigger point in the right trapezius.  5/5 grip strength bilaterally 5/5 strength in flexion and extension of the elbow bilaterally normal radial pulses bilaterally There is some swelling of the right upper extremity related to the left, without erythema or tenderness to palpation.  Lymphadenopathy:     Cervical: No cervical adenopathy.  Skin:    General:  Skin is warm and dry.     Capillary Refill: Capillary refill takes less than 2 seconds.  Neurological:     General: No focal deficit present.     Mental Status: She is alert and oriented to person, place, and time. Mental status is at baseline.     Sensory: Sensation is intact.     Motor: Motor function is intact.     Coordination: Coordination is intact.     Gait: Gait is intact.  Psychiatric:        Mood and Affect: Mood normal.     ED Results / Procedures / Treatments   Labs (all labs ordered are listed, but only abnormal results are displayed) Labs Reviewed - No data to display  EKG None  Radiology US Venous Img Upper Right (DVT Study)  Result Date: 08/25/2020 CLINICAL DATA:  Swelling and tingling EXAM: RIGHT UPPER EXTREMITY VENOUS DOPPLER ULTRASOUND TECHNIQUE: Gray-scale sonography with graded compression, as well as color Doppler and duplex ultrasound were performed to evaluate the upper extremity deep venous system from the level of the subclavian vein and including the jugular, axillary, basilic, radial, ulnar and upper cephalic vein. Spectral Doppler was utilized to evaluate flow at rest and with distal augmentation maneuvers. COMPARISON:  None. FINDINGS: Contralateral Subclavian Vein: Respiratory phasicity is normal and symmetric with the symptomatic side. No evidence of thrombus. Normal compressibility. Internal Jugular Vein: No evidence of thrombus. Normal compressibility, respiratory phasicity and response to augmentation. Subclavian Vein: No evidence of thrombus. Normal compressibility, respiratory phasicity and response to augmentation. Axillary Vein: No evidence of thrombus. Normal compressibility, respiratory phasicity and response to augmentation. Cephalic Vein: No evidence of thrombus. Normal compressibility, respiratory phasicity and response to augmentation. Basilic Vein: No evidence of thrombus. Normal compressibility, respiratory phasicity and response to augmentation.  Brachial Veins: No evidence of thrombus. Normal compressibility, respiratory phasicity and response to augmentation. Radial Veins: No evidence of thrombus. Normal compressibility, respiratory phasicity and response to augmentation. Ulnar Veins: No evidence of thrombus. Normal compressibility, respiratory phasicity and response to augmentation. Venous Reflux:  None visualized. Other Findings:  None visualized. IMPRESSION: No evidence of DVT within the right upper extremity. Electronically Signed   By: Constance Holster M.D.   On: 08/25/2020 19:18    Procedures Procedures   Medications Ordered in ED Medications - No data to display  ED Course  I have reviewed the triage vital signs and the nursing notes.  Pertinent labs & imaging results that were available during my care of the patient were reviewed by me and considered in my medical decision making (see chart for details).    MDM Rules/Calculators/A&P                         40 year old female presents with concern for 1 week of intermittent numbness and tingling in her upper extremities bilaterally as well as sensation of tightness and pain in her shoulders.  Differential diagnosis  for the patient of the globin around mid to cervical neuropathy, disc herniation, transverse myelitis or osteomyelitis, epidural abscess, muscular spasm.  Hypertensive on intake.  Cardiopulmonary exam is normal, abdominal exam is benign.  MSK exam revealed unilateral right upper extremity edema, as well as bilateral trapezius spasm and tenderness palpation, R>L with associated trigger point in the right trapezius.  We will proceed with DVT study of the right upper extremity.  DVT study negative for fibrosis in the right upper extremity.  Given reassuring physical exam vital signs, and imaging study, no further work-up is warranted in ED at this time.  Given symmetric strength and sensation, no concern for cord compression at this time.  Suspect patient symptoms  are secondary to muscle spasm and cervical radiculopathy.  Will discharge with Robaxin and steroid burst and recommend patient follow-up closely with her primary care doctor.  Paytyn voiced understanding of her medical evaluation and treatment plan.  Each of her questions answered to her expressed satisfaction.  Return precautions given.  Patient is stable and appropriate for discharge at this time.  This chart was dictated using voice recognition software, Dragon. Despite the best efforts of this provider to proofread and correct errors, errors may still occur which can change documentation meaning.   Final Clinical Impression(s) / ED Diagnoses Final diagnoses:  Thoracic radiculopathy    Rx / DC Orders ED Discharge Orders         Ordered    methocarbamol (ROBAXIN) 500 MG tablet  2 times daily,   Status:  Discontinued        08/25/20 1938    methocarbamol (ROBAXIN) 500 MG tablet  2 times daily PRN        08/25/20 1942    predniSONE (DELTASONE) 50 MG tablet  Daily        08/25/20 [address] Gypsy Balsam, PA-C 08/26/20 0118    Cleora Karnik, Gypsy Balsam, PA-C 08/26/20 0119    Margette Fast, MD 09/03/20 [phone]

## 2020-08-25 NOTE — ED Notes (Signed)
BP RIGHT Arm: 158/92 BP LEFT Arm: 137/88 BP RIGHT Leg: 170/92 BP LEFT Leg: 189/105

## 2020-08-25 NOTE — Discharge Instructions (Addendum)
You were seen in the emergency department today for your mid back pain, and arm numbness/tingling.  Your physical exam and vital signs are very reassuring.  The muscles in your shoulders and midback are in what is called spasm, meaning they are inappropriately tightened up.  This can be quite painful.  To help with your pain you may take Tylenol and / or NSAID medication (such as ibuprofen or naproxen) to help with your pain.  Additionally, you have been prescribed a muscle relaxer called Robaxin to help relieve some of the muscle spasm.  Please be advised that this medication may make you very sleepy, so you should not drive or operate heavy machinery while you are taking it.  Additionally you have been prescribed 5 days of steroid for inflammation of the nerves coming from your neck and upper back into your arms; this should help with the numbness and tingling in your hands.   You may also utilize topical pain relief such as Biofreeze, IcyHot, or topical lidocaine patches.  I also recommend that you apply heat to the area, such as a hot shower or heating pad, and follow heat application with massage of the muscles that are most tight. Follow up with your primary care doctor.   Please return to the emergency department if you develop any numbness/tingling/weakness in your arms or legs, any difficulty urinating, or urinary incontinence chest pain, shortness of breath, abdominal pain, nausea or vomiting that does not stop, or any other new severe symptoms.

## 2020-08-25 NOTE — ED Notes (Signed)
Has strong bilateral hand grips, rt radial and lt radial pulse 2+, good capillary refill in both hands, temp WNL of both hands as well.

## 2020-08-25 NOTE — ED Notes (Signed)
Having back pain last week, points to area of rt side of base of neck to rt shoulder, states awoke this am with fingers feeling numb and tingling.

## 2020-08-25 NOTE — ED Triage Notes (Signed)
Reports upper back pain and tingling to fingers in both hands.  Also reports a pinching pain in the right shoulder that comes and goes that started today.  Has not taken any medications.

## 2020-08-26 ENCOUNTER — Telehealth: Payer: Medicaid Other | Admitting: Nurse Practitioner

## 2020-08-26 DIAGNOSIS — M546 Pain in thoracic spine: Secondary | ICD-10-CM | POA: Diagnosis not present

## 2020-08-26 NOTE — Progress Notes (Signed)
We are sorry that you are not feeling well.  Here is how we plan to help!  Based on what you have shared with me it looks like you mostly have acute back pain.  Acute back pain is defined as musculoskeletal pain that can resolve in 1-3 weeks with conservative treatment.  Meds as ordered by ED last night.  I can ony write you out for 1 day. You will have to get note from your PCP for longer then that. Your not eis on your my chart.  Home Care  Stay active.  Start with short walks on flat ground if you can.  Try to walk farther each day.  Do not sit, drive or stand in one place for more than 30 minutes.  Do not stay in bed.  Do not avoid exercise or work.  Activity can help your back heal faster.  Be careful when you bend or lift an object.  Bend at your knees, keep the object close to you, and do not twist.  Sleep on a firm mattress.  Lie on your side, and bend your knees.  If you lie on your back, put a pillow under your knees.  Only take medicines as told by your doctor.  Put ice on the injured area.  Put ice in a plastic bag  Place a towel between your skin and the bag  Leave the ice on for 15-20 minutes, 3-4 times a day for the first 2-3 days. 210 After that, you can switch between ice and heat packs.  Ask your doctor about back exercises or massage.  Avoid feeling anxious or stressed.  Find good ways to deal with stress, such as exercise.  Get Help Right Way If:  Your pain does not go away with rest or medicine.  Your pain does not go away in 1 week.  You have new problems.  You do not feel well.  The pain spreads into your legs.  You cannot control when you poop (bowel movement) or pee (urinate)  You feel sick to your stomach (nauseous) or throw up (vomit)  You have belly (abdominal) pain.  You feel like you may pass out (faint).  If you develop a fever.  Make Sure you:  Understand these instructions.  Will watch your condition  Will get help right  away if you are not doing well or get worse.  Your e-visit answers were reviewed by a board certified advanced clinical practitioner to complete your personal care plan.  Depending on the condition, your plan could have included both over the counter or prescription medications.  If there is a problem please reply  once you have received a response from your provider.  Your safety is important to [place].  If you have drug allergies check your prescription carefully.    You can use MyChart to ask questions about today's visit, request a non-urgent call back, or ask for a work or school excuse for 24 hours related to this e-Visit. If it has been greater than 24 hours you will need to follow up with your provider, or enter a new e-Visit to address those concerns.  You will get an e-mail in the next two days asking about your experience.  I hope that your e-visit has been valuable and will speed your recovery. Thank you for using e-visits.  5-10 minutes spent reviewing and documenting in chart.

## 2020-09-03 ENCOUNTER — Telehealth: Payer: Medicaid Other | Admitting: Physician Assistant

## 2020-09-03 ENCOUNTER — Encounter (INDEPENDENT_AMBULATORY_CARE_PROVIDER_SITE_OTHER): Payer: Self-pay

## 2020-09-03 DIAGNOSIS — U071 COVID-19: Secondary | ICD-10-CM

## 2020-09-03 MED ORDER — BENZONATATE 100 MG PO CAPS: 100.0000 mg | ORAL_CAPSULE | Freq: Three times a day (TID) | ORAL | 0 refills | Status: DC | PRN

## 2020-09-03 NOTE — Progress Notes (Signed)
E-Visit for Positive Covid Test Result We are sorry you are not feeling well. We are here to help!  You have tested positive for COVID-19, meaning that you were infected with the novel coronavirus and could give the virus to others.  It is vitally important that you stay home so you do not spread it to others.   A work note will be sent to your MyChart shortly.     Please continue isolation at home, for at least 10 days since the start of your symptoms and until you have had 24 hours with no fever (without taking a fever reducer) and with improving of symptoms.  If you have no symptoms but tested positive (or all symptoms resolve after 5 days and you have no fever) you can leave your house but continue to wear a mask around others for an additional 5 days. If you have a fever,continue to stay home until you have had 24 hours of no fever. Most cases improve 5-10 days from onset but we have seen a small number of patients who have gotten worse after the 10 days.  Please be sure to watch for worsening symptoms and remain taking the proper precautions.   Go to the nearest hospital ED for assessment if fever/cough/breathlessness are severe or illness seems like a threat to life.    The following symptoms may appear 2-14 days after exposure: . Fever . Cough . Shortness of breath or difficulty breathing . Chills . Repeated shaking with chills . Muscle pain . Headache . Sore throat . New loss of taste or smell . Fatigue . Congestion or runny nose . Nausea or vomiting . Diarrhea  You have been enrolled in [facility] for COVID-19. Daily you will receive a questionnaire within the [facility] website. Our COVID-19 response team will be monitoring your responses daily.  You can use medication such as A prescription cough medication called Tessalon Perles 100 mg. You may take 1-2 capsules every 8 hours as needed for cough We have asked the monoclonal antibody team contact you to discuss the  infusion with you and potentially set this up. You should hear from them in the next 48 hours.    You may also take acetaminophen (Tylenol) as needed for fever.  HOME CARE: . Only take medications as instructed by your medical team. . Drink plenty of fluids and get plenty of rest. . A steam or ultrasonic humidifier can help if you have congestion.   GET HELP RIGHT AWAY IF YOU HAVE EMERGENCY WARNING SIGNS.  Call 911 or proceed to your closest emergency facility if: . You develop worsening high fever. . Trouble breathing . Bluish lips or face . Persistent pain or pressure in the chest . New confusion . Inability to wake or stay awake . You cough up blood. . Your symptoms become more severe . Inability to hold down food or fluids  This list is not all possible symptoms. Contact your medical provider for any symptoms that are severe or concerning to you.    Your e-visit answers were reviewed by a board certified advanced clinical practitioner to complete your personal care plan.  Depending on the condition, your plan could have included both over the counter or prescription medications.  If there is a problem please reply once you have received a response from your provider.  Your safety is important to [place].  If you have drug allergies check your prescription carefully.    You can use MyChart to  ask questions about today's visit, request a non-urgent call back, or ask for a work or school excuse for 24 hours related to this e-Visit. If it has been greater than 24 hours you will need to follow up with your provider, or enter a new e-Visit to address those concerns. You will get an e-mail in the next two days asking about your experience.  I hope that your e-visit has been valuable and will speed your recovery. Thank you for using e-visits.

## 2020-09-03 NOTE — Progress Notes (Signed)
I have spent 5 minutes in review of e-visit questionnaire, review and updating patient chart, medical decision making and response to patient.   Yosiel Thieme Cody Shadoe Cryan, PA-C    

## 2020-09-03 NOTE — Progress Notes (Signed)
Pt states "Worsening weakness" entry was in error. States no worsening symptoms.

## 2020-09-04 ENCOUNTER — Telehealth: Payer: Self-pay

## 2020-09-04 ENCOUNTER — Telehealth: Payer: Self-pay | Admitting: Unknown Physician Specialty

## 2020-09-04 NOTE — Telephone Encounter (Signed)
I connected by phone with Christina Fox on 09/04/2020 at 10:27 AM to discuss the potential use of a new treatment for mild to moderate COVID-19 viral infection in non-hospitalized patients.  This patient is a 40 y.o. female that meets the FDA criteria for Emergency Use Authorization of COVID monoclonal antibody sotrovimab.  Has a (+) direct SARS-CoV-2 viral test result  Has mild or moderate COVID-19   Is NOT hospitalized due to COVID-19  Is within 10 days of symptom onset  Has at least one of the high risk factor(s) for progression to severe COVID-19 and/or hospitalization as defined in EUA.  Specific high risk criteria : BMI > 25   Pt states she is better and not interested in treatment at this time

## 2020-09-04 NOTE — Telephone Encounter (Signed)
Called to discuss with patient about COVID-19 symptoms and the use of one of the available treatments for those with mild to moderate Covid symptoms and at a high risk of hospitalization.  Pt appears to qualify for outpatient treatment due to co-morbid conditions and/or a member of an at-risk group in accordance with the FDA Emergency Use Authorization.    Symptom onset: Unknown Vaccinated: Unknown Booster? Unknown Immunocompromised? No Qualifiers: HTN  Unable to reach pt - Left message and call back number [phone].   Marcello Moores

## 2020-09-10 ENCOUNTER — Telehealth: Payer: Medicaid Other | Admitting: Emergency Medicine

## 2020-09-10 DIAGNOSIS — U071 COVID-19: Secondary | ICD-10-CM

## 2020-09-10 NOTE — Progress Notes (Signed)
Hi Christina Fox,   Because you have developed a fever 11 days after initial symptom onset, there is concern for a secondary bacterial infection such as a sinus infection, pneumonia, or something else.  Based on what you shared with me, I feel your condition warrants further evaluation and I recommend that you be seen for a face to face office visit.   NOTE: If you entered your credit card information for this eVisit, you will not be charged. You may see a "hold" on your card for the $35 but that hold will drop off and you will not have a charge processed.   If you are having a true medical emergency please call 911.      For an urgent face to face visit, [facility] has five urgent care centers for your convenience:     [facility] [place] at  Get Driving Directions [phone] [place], [place] [address] . 10 am - 6pm Monday - Friday    [place] [facility] [place] [facility]) Get Driving Directions [phone] [address] [place], [place] [address] . 10 am to 8 pm Monday-Friday . 12 pm to 8 pm [facility]  Get Driving Directions [phone] [place], [place], [place] [address] . 8 am to 8 pm Monday-Friday . 9 am to 6 pm Saturday . 11 am to 6 pm Sunday     [facility] [place] Get Driving Directions  [phone] [address] [place], [place] [address] . 8 am to 8 pm Monday-Friday . 8 am to 4 pm [facility] at [place] Get Driving Directions [phone] [address] [place], [place] [address] . 12 pm to 6 pm Monday-Friday      Your e-visit answers were reviewed by a board certified advanced clinical practitioner to complete your personal care plan.  Thank you for using e-Visits.    Approximately 5 minutes was spent documenting and reviewing patient's chart.

## 2020-09-11 ENCOUNTER — Ambulatory Visit: Admit: 2020-09-11 | Discharge status: Against Medical Advice | Payer: Medicaid Other

## 2020-09-12 ENCOUNTER — Telehealth: Payer: Medicaid Other | Admitting: Physician Assistant

## 2020-09-12 ENCOUNTER — Encounter: Payer: Self-pay | Admitting: Physician Assistant

## 2020-09-12 DIAGNOSIS — B373 Candidiasis of vulva and vagina: Secondary | ICD-10-CM

## 2020-09-12 DIAGNOSIS — B3731 Acute candidiasis of vulva and vagina: Secondary | ICD-10-CM

## 2020-09-12 MED ORDER — FLUCONAZOLE 150 MG PO TABS: 150.0000 mg | ORAL_TABLET | Freq: Once | ORAL | 0 refills | Status: AC

## 2020-09-12 NOTE — Progress Notes (Signed)
We are sorry that you are not feeling well. Here is how we plan to help! Based on what you shared with me it looks like you: May have a yeast vaginosis  Vaginosis is an inflammation of the vagina that can result in discharge, itching and pain. The cause is usually a change in the normal balance of vaginal bacteria or an infection. Vaginosis can also result from reduced estrogen levels after menopause.  The most common causes of vaginosis are:   Bacterial vaginosis which results from an overgrowth of one on several organisms that are normally present in your vagina.   Yeast infections which are caused by a naturally occurring fungus called candida.   Vaginal atrophy (atrophic vaginosis) which results from the thinning of the vagina from reduced estrogen levels after menopause.   Trichomoniasis which is caused by a parasite and is commonly transmitted by sexual intercourse.  Factors that increase your risk of developing vaginosis include: . Medications, such as antibiotics and steroids . Uncontrolled diabetes . Use of hygiene products such as bubble bath, vaginal spray or vaginal deodorant . Douching . Wearing damp or tight-fitting clothing . Using an intrauterine device (IUD) for birth control . Hormonal changes, such as those associated with pregnancy, birth control pills or menopause . Sexual activity . Having a sexually transmitted infection  Your treatment plan is A single Diflucan (fluconazole) 150mg tablet once.  I have electronically sent this prescription into the pharmacy that you have chosen.  Be sure to take all of the medication as directed. Stop taking any medication if you develop a rash, tongue swelling or shortness of breath. Mothers who are breast feeding should consider pumping and discarding their breast milk while on these antibiotics. However, there is no consensus that infant exposure at these doses would be harmful.  Remember that medication creams can weaken latex  condoms. .   HOME CARE:  Good hygiene may prevent some types of vaginosis from recurring and may relieve some symptoms:  . Avoid baths, hot tubs and whirlpool spas. Rinse soap from your outer genital area after a shower, and dry the area well to prevent irritation. Don't use scented or harsh soaps, such as those with deodorant or antibacterial action. . Avoid irritants. These include scented tampons and pads. . Wipe from front to back after using the toilet. Doing so avoids spreading fecal bacteria to your vagina.  Other things that may help prevent vaginosis include:  . Don't douche. Your vagina doesn't require cleansing other than normal bathing. Repetitive douching disrupts the normal organisms that reside in the vagina and can actually increase your risk of vaginal infection. Douching won't clear up a vaginal infection. . Use a latex condom. Both female and female latex condoms may help you avoid infections spread by sexual contact. . Wear cotton underwear. Also wear pantyhose with a cotton crotch. If you feel comfortable without it, skip wearing underwear to bed. Yeast thrives in moist environments Your symptoms should improve in the next day or two.  GET HELP RIGHT AWAY IF:  . You have pain in your lower abdomen ( pelvic area or over your ovaries) . You develop nausea or vomiting . You develop a fever . Your discharge changes or worsens . You have persistent pain with intercourse . You develop shortness of breath, a rapid pulse, or you faint.  These symptoms could be signs of problems or infections that need to be evaluated by a medical provider now.  MAKE SURE YOU      Understand these instructions.  Will watch your condition.  Will get help right away if you are not doing well or get worse.  Your e-visit answers were reviewed by a board certified advanced clinical practitioner to complete your personal care plan. Depending upon the condition, your plan could have included  both over the counter or prescription medications. Please review your pharmacy choice to make sure that you have choses a pharmacy that is open for you to pick up any needed prescription, Your safety is important to [place]. If you have drug allergies check your prescription carefully.   You can use MyChart to ask questions about today's visit, request a non-urgent call back, or ask for a work or school excuse for 24 hours related to this e-Visit. If it has been greater than 24 hours you will need to follow up with your provider, or enter a new e-Visit to address those concerns. You will get a MyChart message within the next two days asking about your experience. I hope that your e-visit has been valuable and will speed your recovery.  I spent 5-10 minutes on review and completion of this note- Lacy Duverney [facility]

## 2020-09-18 ENCOUNTER — Telehealth: Payer: Medicaid Other | Admitting: Emergency Medicine

## 2020-09-18 DIAGNOSIS — N939 Abnormal uterine and vaginal bleeding, unspecified: Secondary | ICD-10-CM

## 2020-09-18 NOTE — Progress Notes (Signed)
Taydem,  Thank you for the additional information.  Yes, there are multiple urgent cares in the area that can assist you.    Based on what you shared with me, I feel your condition warrants further evaluation and I recommend that you be seen for a face to face office visit.   NOTE: If you entered your credit card information for this eVisit, you will not be charged. You may see a "hold" on your card for the $35 but that hold will drop off and you will not have a charge processed.   If you are having a true medical emergency please call 911.      For an urgent face to face visit, [facility] has five urgent care centers for your convenience:     [place] [facility] [place] [facility] [place] Get Driving Directions [phone] [place], [place] [address] . 10 am - 6pm Monday - Friday    [place] [facility] [place] [facility]) Get Driving Directions [phone] [address] [place], [place] [address] . 10 am to 8 pm Monday-Friday . 12 pm to 8 pm [facility] [place] Get Driving Directions [phone] [place], [place], [place] [address] . 8 am to 8 pm Monday-Friday . 9 am to 6 pm Saturday . 11 am to 6 pm Sunday     [facility] [place] Get Driving Directions  [phone] [address] [place], [place] [address] . 8 am to 8 pm Monday-Friday . 8 am to 4 pm [facility] at [place] Get Driving Directions [phone] [address] [place],  [address] . 12 pm to 6 pm Monday-Friday      Your e-visit answers were reviewed by a board certified advanced clinical practitioner to complete your personal care plan.  Thank you for using e-Visits.    Approximately 5 minutes was spent documenting and reviewing patient's chart.

## 2020-10-21 ENCOUNTER — Other Ambulatory Visit: Payer: Self-pay

## 2020-10-21 DIAGNOSIS — R1013 Epigastric pain: Secondary | ICD-10-CM | POA: Diagnosis present

## 2020-10-21 DIAGNOSIS — R112 Nausea with vomiting, unspecified: Secondary | ICD-10-CM | POA: Diagnosis not present

## 2020-10-21 DIAGNOSIS — F17210 Nicotine dependence, cigarettes, uncomplicated: Secondary | ICD-10-CM | POA: Diagnosis not present

## 2020-10-21 DIAGNOSIS — R1031 Right lower quadrant pain: Secondary | ICD-10-CM | POA: Insufficient documentation

## 2020-10-21 DIAGNOSIS — K219 Gastro-esophageal reflux disease without esophagitis: Secondary | ICD-10-CM | POA: Diagnosis not present

## 2020-10-21 DIAGNOSIS — I10 Essential (primary) hypertension: Secondary | ICD-10-CM | POA: Insufficient documentation

## 2020-10-22 ENCOUNTER — Emergency Department (HOSPITAL_BASED_OUTPATIENT_CLINIC_OR_DEPARTMENT_OTHER): Payer: Medicaid Other

## 2020-10-22 ENCOUNTER — Emergency Department (HOSPITAL_BASED_OUTPATIENT_CLINIC_OR_DEPARTMENT_OTHER)
Admission: EM | Admit: 2020-10-22 | Discharge: 2020-10-22 | Disposition: A | Discharge status: Home / Self Care | Payer: Medicaid Other | Attending: Emergency Medicine | Admitting: Emergency Medicine

## 2020-10-22 ENCOUNTER — Other Ambulatory Visit: Payer: Self-pay

## 2020-10-22 ENCOUNTER — Encounter (HOSPITAL_BASED_OUTPATIENT_CLINIC_OR_DEPARTMENT_OTHER): Payer: Self-pay

## 2020-10-22 DIAGNOSIS — R1031 Right lower quadrant pain: Secondary | ICD-10-CM

## 2020-10-22 LAB — URINALYSIS, ROUTINE W REFLEX MICROSCOPIC
Bilirubin Urine: NEGATIVE
Glucose, UA: NEGATIVE mg/dL
Hgb urine dipstick: NEGATIVE
Ketones, ur: NEGATIVE mg/dL
Leukocytes,Ua: NEGATIVE
Nitrite: NEGATIVE
Protein, ur: NEGATIVE mg/dL
Specific Gravity, Urine: 1.015 (ref 1.005–1.030)
pH: 5.5 (ref 5.0–8.0)

## 2020-10-22 LAB — COMPREHENSIVE METABOLIC PANEL
ALT: 36 U/L (ref 0–44)
AST: 21 U/L (ref 15–41)
Albumin: 4 g/dL (ref 3.5–5.0)
Alkaline Phosphatase: 52 U/L (ref 38–126)
Anion gap: 8 (ref 5–15)
BUN: 9 mg/dL (ref 6–20)
CO2: 27 mmol/L (ref 22–32)
Calcium: 9.2 mg/dL (ref 8.9–10.3)
Chloride: 104 mmol/L (ref 98–111)
Creatinine, Ser: 0.71 mg/dL (ref 0.44–1.00)
GFR, Estimated: >60 mL/min (ref >60)
Glucose, Bld: 108 mg/dL — ABNORMAL HIGH (ref 70–99)
Potassium: 3.4 mmol/L — ABNORMAL LOW (ref 3.5–5.1)
Sodium: 139 mmol/L (ref 135–145)
Total Bilirubin: 0.4 mg/dL (ref 0.3–1.2)
Total Protein: 6.3 g/dL — ABNORMAL LOW (ref 6.5–8.1)

## 2020-10-22 LAB — CBC
HCT: 38.8 % (ref 36.0–46.0)
Hemoglobin: 12.8 g/dL (ref 12.0–15.0)
MCH: 29.7 pg (ref 26.0–34.0)
MCHC: 33 g/dL (ref 30.0–36.0)
MCV: 90 fL (ref 80.0–100.0)
Platelets: 272 10*3/uL (ref 150–400)
RBC: 4.31 MIL/uL (ref 3.87–5.11)
RDW: 12.7 % (ref 11.5–15.5)
WBC: 8.4 10*3/uL (ref 4.0–10.5)
nRBC: 0 % (ref 0.0–0.2)

## 2020-10-22 LAB — LIPASE, BLOOD: Lipase: 28 U/L (ref 11–51)

## 2020-10-22 LAB — PREGNANCY, URINE: Preg Test, Ur: NEGATIVE

## 2020-10-22 MED ORDER — IOHEXOL 300 MG/ML  SOLN
100.0000 mL | Freq: Once | INTRAMUSCULAR | Status: AC | PRN
  Administered 2020-10-22: 100 mL via INTRAVENOUS

## 2020-10-22 NOTE — Discharge Instructions (Addendum)
You were evaluated in the Emergency Department and after careful evaluation, we did not find any emergent condition requiring admission or further testing in the hospital.  Your exam/testing today was overall reassuring.  Recommend increasing dietary fiber to prevent constipation.  Recommend follow-up with your GI doctor if symptoms continue.  Please return to the Emergency Department if you experience any worsening of your condition.  Thank you for allowing [place] to be a part of your care.

## 2020-10-22 NOTE — ED Triage Notes (Signed)
Pt came in for abd pain x a week - states also vomited few days ago   Pain score 7/10   Came in ambulatory -  Alert and oriented

## 2020-10-22 NOTE — ED Provider Notes (Signed)
[facility] Emergency Department Provider Note MRN:  [id number]  Arrival date & time: 10/22/20     Chief Complaint   Abdominal Pain   History of Present Illness   Christina Fox is a 40 y.o. year-old female with a history of hypertension presenting to the ED with chief complaint of abdominal pain.  Location: Epigastrium radiating to the right lower quadrant Duration: 1 week Onset: Gradual Timing: Constant Description: Dull Severity: Mild to moderate Exacerbating/Alleviating Factors: None Associated Symptoms: Occasional nausea, single episode nonbloody nonbilious emesis Pertinent Negatives: Denies fever, no anorexia, no chest pain or shortness of breath, no burning with urination.   Review of Systems  A complete 10 system review of systems was obtained and all systems are negative except as noted in the HPI and PMH.   Patient's Health History    Past Medical History:  Diagnosis Date  . Abdominal pain    "right lower quadrant pain and right lower back pain  . Anxiety   . Deviated septum 09/2011  . GERD (gastroesophageal reflux disease)   . Headache(784.0)    sinus  . History of echocardiogram    Echo 11/17: EF 55-60, no RWMA, normal diastolic function  . Hypertension    no meds  . Kidney stones    no current problem  . Migraine   . Nasal turbinate hypertrophy 09/2011   bilat.  . Pelvic inflammatory disease   . Urticaria     Past Surgical History:  Procedure Laterality Date  . CHOLECYSTECTOMY N/A 01/15/2016   Procedure: LAPAROSCOPIC CHOLECYSTECTOMY WITH INTRAOPERATIVE CHOLANGIOGRAM;  Surgeon: Christene Lye, MD;  Location: [facility] ORS;  Service: General;  Laterality: N/A;  . COLONOSCOPY  2007   [facility]: pedunculated 40mm polyp in distal sigmoid, sessile 31mm polyp at splenic flexure, larger polyp tubulovillous adenoma and smaller polyp tubular adenoma   . COLONOSCOPY  09-09-15  . COLONOSCOPY WITH PROPOFOL N/A 03/01/2016   Procedure:  COLONOSCOPY WITH PROPOFOL;  Surgeon: Garlan Fair, MD;  Location: [facility];  Service: Endoscopy;  Laterality: N/A;  . NASAL SEPTOPLASTY W/ TURBINOPLASTY  10/04/2011   Procedure: NASAL SEPTOPLASTY WITH TURBINATE REDUCTION;  Surgeon: Ascencion Dike, MD;  Location: [facility];  Service: ENT;  Laterality: Bilateral;  . WISDOM TOOTH EXTRACTION  2009    Family History  Problem Relation Age of Onset  . Kidney cancer Mother   . Hypertension Mother   . Migraines Mother   . Multiple sclerosis Mother   . Fibromyalgia Mother   . Liver disease Mother        NASH   . Hypertension Father   . Liver disease Father        liver transplant; had fatty liver disease  . Spina bifida Brother   . Migraines Maternal Grandmother   . Hypertension Other   . Colon cancer Paternal Grandfather   . Allergic rhinitis Daughter   . Asthma Daughter   . Allergic rhinitis Son   . Asthma Son   . Angioedema Neg Hx   . Eczema Neg Hx   . Immunodeficiency Neg Hx   . Urticaria Neg Hx     Social History   Socioeconomic History  . Marital status: Single    Spouse name: Not on file  . Number of children: 4  . Years of education: 55  . Highest education level: Not on file  Occupational History  . 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 Smoking status:  Current Every Day Smoker    Packs/day: 0.25    Years: 10.00    Pack years: 2.50    Types: Cigarettes  . Smokeless tobacco: Never Used  . Tobacco comment: 2-3 cigarettes a day  Vaping Use  . Vaping Use: Never used  Substance and Sexual Activity  . Alcohol use: Yes    Alcohol/week: 0.0 standard drinks    Comment: ocassionally  . Drug use: No  . Sexual activity: Yes    Partners: Male    Birth control/protection: None  Other Topics Concern  . Not on file  Social History Narrative   Lives at home w/ her children   Right-handed   Drinks occasional sweet tea   Social Determinants of Health   Financial Resource Strain:  Not on file  Food Insecurity: Not on file  Transportation Needs: Not on file  Physical Activity: Not on file  Stress: Not on file  Social Connections: Not on file  Intimate Partner Violence: Not on file     Physical Exam   Vitals:   10/22/20 0003 10/22/20 0107  BP: (!) 162/99 129/86  Pulse: 85 79  Resp: 20 20  Temp: 97.7 F (36.5 C)   SpO2: 100% 100%    CONSTITUTIONAL: Well-appearing, NAD NEURO:  Alert and oriented x 3, no focal deficits EYES:  eyes equal and reactive ENT/NECK:  no LAD, no JVD CARDIO: Regular rate, well-perfused, normal S1 and S2 PULM:  CTAB no wheezing or rhonchi GI/GU:  normal bowel sounds, non-distended, non-tender MSK/SPINE:  No gross deformities, no edema SKIN:  no rash, atraumatic PSYCH:  Appropriate speech and behavior  *Additional and/or pertinent findings included in MDM below  Diagnostic and Interventional Summary    EKG Interpretation  Date/Time:    Ventricular Rate:    PR Interval:    QRS Duration:   QT Interval:    QTC Calculation:   R Axis:     Text Interpretation:        Labs Reviewed  COMPREHENSIVE METABOLIC PANEL - Abnormal; Notable for the following components:      Result Value   Potassium 3.4 (*)    Glucose, Bld 108 (*)    Total Protein 6.3 (*)    All other components within normal limits  CBC  LIPASE, BLOOD  PREGNANCY, URINE  URINALYSIS, ROUTINE W REFLEX MICROSCOPIC    CT ABDOMEN PELVIS W CONTRAST  Final Result      Medications  iohexol (OMNIPAQUE) 300 MG/ML solution 100 mL (100 mLs Intravenous Contrast Given 10/22/20 0118)     Procedures  /  Critical Care Procedures  ED Course and Medical Decision Making  I have reviewed the triage vital signs, the nursing notes, and pertinent available records from the EMR.  Listed above are laboratory and imaging tests that I personally ordered, reviewed, and interpreted and then considered in my medical decision making (see below for details).  Will need CT to exclude  appendicitis versus right-sided diverticulitis.  Also considering ovarian cyst, UTI.  Work-up pending.     Labs and urine reassuring, CT is without acute process.  No emergent condition present, appropriate for discharge.  Barth Kirks. [place] Small, [place] [email or web address]  Final Clinical Impressions(s) / ED Diagnoses     ICD-10-CM   1. Right lower quadrant abdominal pain  R10.31     ED Discharge Orders    None       Discharge Instructions Discussed with and Provided to Patient:  Discharge Instructions     You were evaluated in the Emergency Department and after careful evaluation, we did not find any emergent condition requiring admission or further testing in the hospital.  Your exam/testing today was overall reassuring.  Recommend increasing dietary fiber to prevent constipation.  Recommend follow-up with your GI doctor if symptoms continue.  Please return to the Emergency Department if you experience any worsening of your condition.  Thank you for allowing [place] to be a part of your care.        Maudie Flakes, MD 10/22/20 [phone]

## 2020-11-08 ENCOUNTER — Encounter: Payer: Self-pay | Admitting: Emergency Medicine

## 2020-11-08 ENCOUNTER — Other Ambulatory Visit: Payer: Self-pay

## 2020-11-08 ENCOUNTER — Ambulatory Visit
Admission: EM | Admit: 2020-11-08 | Discharge: 2020-11-08 | Disposition: A | Discharge status: Home / Self Care | Payer: Medicaid Other | Attending: Emergency Medicine | Admitting: Emergency Medicine

## 2020-11-08 DIAGNOSIS — J069 Acute upper respiratory infection, unspecified: Secondary | ICD-10-CM | POA: Diagnosis present

## 2020-11-08 LAB — POCT RAPID STREP A (OFFICE): Rapid Strep A Screen: NEGATIVE

## 2020-11-08 MED ORDER — BENZONATATE 200 MG PO CAPS: 200.0000 mg | ORAL_CAPSULE | Freq: Three times a day (TID) | ORAL | 0 refills | Status: AC | PRN

## 2020-11-08 NOTE — ED Triage Notes (Signed)
Pt here for sore throat x 3 days; denies fever and had negative covid test

## 2020-11-08 NOTE — ED Provider Notes (Signed)
[facility]    CSN: [id number] Arrival date & time: 11/08/20  1355      History   Chief Complaint Chief Complaint  Patient presents with  . Sore Throat    HPI Christina Fox is a 40 y.o. female presenting today for evaluation of sore throat.  Patient reports associated sore throat cough and mild congestion over the past 3 to 4 days.  Reports at home COVID test which was negative.  Denies fevers.  Using Xyzal, Singulair.  HPI  Past Medical History:  Diagnosis Date  . Abdominal pain    "right lower quadrant pain and right lower back pain  . Anxiety   . Deviated septum 09/2011  . GERD (gastroesophageal reflux disease)   . Headache(784.0)    sinus  . History of echocardiogram    Echo 11/17: EF 55-60, no RWMA, normal diastolic function  . Hypertension    no meds  . Kidney stones    no current problem  . Migraine   . Nasal turbinate hypertrophy 09/2011   bilat.  . Pelvic inflammatory disease   . Urticaria     Patient Active Problem List   Diagnosis Date Noted  . Upper airway cough syndrome 07/08/2020  . Gallstone 01/07/2016  . History of colonic polyps 11/27/2015  . Right lower quadrant abdominal pain 11/27/2015  . HTN (hypertension), benign 11/24/2015  . Abdominal pain 11/16/2015  . Headache 10/13/2015  . 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 Anxiety disorder 11/03/2014    Past Surgical History:  Procedure Laterality Date  . CHOLECYSTECTOMY N/A 01/15/2016   Procedure: LAPAROSCOPIC CHOLECYSTECTOMY WITH INTRAOPERATIVE CHOLANGIOGRAM;  Surgeon: Christene Lye, MD;  Location: [facility] ORS;  Service: General;  Laterality: N/A;  . COLONOSCOPY  2007   [facility]: pedunculated 73mm polyp in distal sigmoid, sessile 51mm polyp at splenic flexure, larger polyp tubulovillous adenoma and smaller polyp tubular adenoma   . COLONOSCOPY  09-09-15  . COLONOSCOPY WITH PROPOFOL N/A 03/01/2016   Procedure: COLONOSCOPY WITH PROPOFOL;  Surgeon: Garlan Fair, MD;  Location: [facility]  ENDOSCOPY;  Service: Endoscopy;  Laterality: N/A;  . NASAL SEPTOPLASTY W/ TURBINOPLASTY  10/04/2011   Procedure: NASAL SEPTOPLASTY WITH TURBINATE REDUCTION;  Surgeon: Ascencion Dike, MD;  Location: [facility];  Service: ENT;  Laterality: Bilateral;  . WISDOM TOOTH EXTRACTION  2009    OB History    Gravida  5   Para  4   Term  4   Preterm      AB  1   Living  4     SAB  1   IAB      Ectopic      Multiple      Live Births  4        Obstetric Comments  1st Menstrual Cycle:  10  1st Pregnancy:  16          Home Medications    Prior to Admission medications   Medication Sig Start Date End Date Taking? Authorizing Provider  benzonatate (TESSALON) 200 MG capsule Take 1 capsule (200 mg total) by mouth 3 (three) times daily as needed for up to 7 days for cough. 11/08/20 11/15/20 Yes Ariba Lehnen C, PA-C  acetaminophen (TYLENOL) 500 MG tablet Take 500-1,000 mg by mouth every 6 (six) hours as needed for mild pain.     [provider]  albuterol (VENTOLIN HFA) 108 (90 Base) MCG/ACT inhaler Inhale 2 puffs into the lungs every 6 (six) hours as needed  for wheezing or shortness of breath.    [provider]  clonazePAM (KLONOPIN) 0.5 MG tablet Take 0.5 mg by mouth 2 (two) times daily as needed for anxiety.     [provider]  Drospirenone (SLYND) 4 MG TABS Take 1 tablet by mouth daily. 07/23/20   Shelly Bombard, MD  hydrocortisone (ANUSOL-HC) 25 MG suppository Place 1 suppository (25 mg total) rectally 2 (two) times daily. 08/21/20   McVey, Gelene Mink, PA-C  ibuprofen (ADVIL) 800 MG tablet Take 1 tablet (800 mg total) by mouth every 8 (eight) hours as needed. 07/23/20   Shelly Bombard, MD  Magnesium 500 MG TABS Take 1 tablet by mouth daily. 06/25/20   [provider]  methocarbamol (ROBAXIN) 500 MG tablet Take 1 tablet (500 mg total) by mouth 2 (two) times daily as needed for muscle spasms. 08/25/20   Sponseller, Eugene Garnet R,  PA-C  montelukast (SINGULAIR) 10 MG tablet Take 10 mg by mouth at bedtime.    [provider]  omeprazole (PRILOSEC) 40 MG capsule Take 40 mg by mouth daily.     [provider]  phenazopyridine (PYRIDIUM) 95 MG tablet Take 1 tablet (95 mg total) by mouth 3 (three) times daily as needed for pain. 07/20/20   Montine Circle, PA-C  Potassium Chloride Crys ER (KLOR-CON M20 PO) Take 1 tablet by mouth daily. 06/25/20   [provider]  Vitamin D, Ergocalciferol, (DRISDOL) 1.25 MG (50000 UNIT) CAPS capsule Take 50,000 Units by mouth See admin instructions. Twice weekly    [provider]  escitalopram (LEXAPRO) 10 MG tablet Take 10 mg by mouth daily.    09/02/11  [provider]    Family History Family History  Problem Relation Age of Onset  . Kidney cancer Mother   . Hypertension Mother   . Migraines Mother   . Multiple sclerosis Mother   . Fibromyalgia Mother   . Liver disease Mother        NASH   . Hypertension Father   . Liver disease Father        liver transplant; had fatty liver disease  . Spina bifida Brother   . Migraines Maternal Grandmother   . Hypertension Other   . Colon cancer Paternal Grandfather   . Allergic rhinitis Daughter   . Asthma Daughter   . Allergic rhinitis Son   . Asthma Son   . Angioedema Neg Hx   . Eczema Neg Hx   . Immunodeficiency Neg Hx   . Urticaria Neg Hx     Social History Social History   Tobacco Use  . Smoking status: Current Every Day Smoker    Packs/day: 0.25    Years: 10.00    Pack years: 2.50    Types: Cigarettes  . Smokeless tobacco: Never Used  . Tobacco comment: 2-3 cigarettes a day  Vaping Use  . Vaping Use: Never used  Substance Use Topics  . Alcohol use: Yes    Alcohol/week: 0.0 standard drinks    Comment: ocassionally  . Drug use: No     Allergies   Clarithromycin, Doxycycline, Dicyclomine, Sulfasalazine, Bentyl [dicyclomine hcl], Haldol [haloperidol], Meclizine, Morphine  and related, Toradol [ketorolac tromethamine], Macrobid [nitrofurantoin monohyd macro], Meclizine hcl, Nitrofurantoin, and Sulfa antibiotics   Review of Systems Review of Systems  Constitutional: Negative for activity change, appetite change, chills, fatigue and fever.  HENT: Positive for congestion and sore throat. Negative for ear pain, rhinorrhea, sinus pressure and trouble swallowing.   Eyes:  Negative for discharge and redness.  Respiratory: Positive for cough. Negative for chest tightness and shortness of breath.   Cardiovascular: Negative for chest pain.  Gastrointestinal: Negative for abdominal pain, diarrhea, nausea and vomiting.  Musculoskeletal: Negative for myalgias.  Skin: Negative for rash.  Neurological: Positive for headaches. Negative for dizziness and light-headedness.     Physical Exam Triage Vital Signs ED Triage Vitals  Enc Vitals Group     BP 11/08/20 1431 138/87     Pulse Rate 11/08/20 1431 94     Resp 11/08/20 1431 18     Temp 11/08/20 1431 97.8 F (36.6 C)     Temp Source 11/08/20 1431 Oral     SpO2 11/08/20 1431 95 %     Weight --      Height --      Head Circumference --      Peak Flow --      Pain Score 11/08/20 1428 6     Pain Loc --      Pain Edu? --      Excl. in [place]? --    No data found.  Updated Vital Signs BP 138/87 (BP Location: Left Arm)   Pulse 94   Temp 97.8 F (36.6 C) (Oral)   Resp 18   LMP 10/14/2020   SpO2 95%   Visual Acuity Right Eye Distance:   Left Eye Distance:   Bilateral Distance:    Right Eye Near:   Left Eye Near:    Bilateral Near:     Physical Exam Vitals and nursing note reviewed.  Constitutional:      Appearance: She is well-developed.     Comments: No acute distress  HENT:     Head: Normocephalic and atraumatic.     Ears:     Comments: Bilateral ears without tenderness to palpation of external auricle, tragus and mastoid, EAC's without erythema or swelling, TM's with good bony landmarks and cone of  light. Non erythematous.     Nose: Nose normal.     Mouth/Throat:     Comments: Oral mucosa pink and moist, no tonsillar enlargement or exudate. Posterior pharynx patent and nonerythematous, no uvula deviation or swelling. Normal phonation. Eyes:     Conjunctiva/sclera: Conjunctivae normal.  Cardiovascular:     Rate and Rhythm: Normal rate.  Pulmonary:     Effort: Pulmonary effort is normal. No respiratory distress.     Comments: Breathing comfortably at rest, CTABL, no wheezing, rales or other adventitious sounds auscultated Abdominal:     General: There is no distension.  Musculoskeletal:        General: Normal range of motion.     Cervical back: Neck supple.  Skin:    General: Skin is warm and dry.  Neurological:     Mental Status: She is alert and oriented to person, place, and time.      UC Treatments / Results  Labs (all labs ordered are listed, but only abnormal results are displayed) Labs Reviewed  CULTURE, GROUP A STREP [facility])  POCT RAPID STREP A (OFFICE)    EKG   Radiology No results found.  Procedures Procedures (including critical care time)  Medications Ordered in UC Medications - No data to display  Initial Impression / Assessment and Plan / UC Course  I have reviewed the triage vital signs and the nursing notes.  Pertinent labs & imaging results that were available during my care of the patient were reviewed by me and considered in my medical  decision making (see chart for details).     Strep test negative, suspect likely viral URI and recommending continued symptomatic and supportive care.  Exam reassuring.  Recommendations provided, continue to monitor,Discussed strict return precautions. Patient verbalized understanding and is agreeable with plan.  Final Clinical Impressions(s) / UC Diagnoses   Final diagnoses:  Viral URI with cough     Discharge Instructions     Strep test negative Symptoms likely from viral respiratory  infection Continue Xyzal, Flonase, ibuprofen and Tylenol May add an over-the-counter Mucinex, Sudafed for further relief of congestion and drainage May use Tessalon for cough, Or over-the-counter Delsym Robitussin, Dimetapp Rest and fluids Follow-up if not improving or worsening    ED Prescriptions    Medication Sig Dispense Auth. Provider   benzonatate (TESSALON) 200 MG capsule Take 1 capsule (200 mg total) by mouth 3 (three) times daily as needed for up to 7 days for cough. 28 capsule Aishah Teffeteller, [place] C, PA-C     PDMP not reviewed this encounter.   Janith Lima, PA-C 11/08/20 1704

## 2020-11-08 NOTE — Discharge Instructions (Addendum)
Strep test negative Symptoms likely from viral respiratory infection Continue Xyzal, Flonase, ibuprofen and Tylenol May add an over-the-counter Mucinex, Sudafed for further relief of congestion and drainage May use Tessalon for cough, Or over-the-counter Delsym Robitussin, Dimetapp Rest and fluids Follow-up if not improving or worsening

## 2020-11-09 ENCOUNTER — Telehealth: Payer: Medicaid Other | Admitting: Physician Assistant

## 2020-11-09 DIAGNOSIS — L739 Follicular disorder, unspecified: Secondary | ICD-10-CM

## 2020-11-09 MED ORDER — MUPIROCIN 2 % EX OINT: 1.0000 "application " | TOPICAL_OINTMENT | Freq: Two times a day (BID) | CUTANEOUS | 0 refills | Status: DC

## 2020-11-09 NOTE — Progress Notes (Signed)
I have spent 5 minutes in review of e-visit questionnaire, review and updating patient chart, medical decision making and response to patient.   Amy Belloso Cody Zaxton Angerer, PA-C    

## 2020-11-09 NOTE — Progress Notes (Signed)
E Visit for Rash  We are sorry that you are not feeling well. Here is how we plan to help!  Based upon what you have shared with me it looks like you have a bacterial follicultits.  Folliculitis is inflammation of the hair follicles that can be caused by a superficial infection of the skin and is treated with an antibiotic. I have prescribed: and Topical mupiricin. I recommend warm compresses to the area. Try to avoid picking at the area. You can apply a small amount of a topical astringent to the area as well to soothe the skin.    HOME CARE:   Take an antihistamine like Benadryl for widespread rashes that itch.  The adult dose of Benadryl is 25-50 mg by mouth 4 times daily.  Caution:  This type of medication may cause sleepiness.  Do not drink alcohol, drive, or operate dangerous machinery while taking antihistamines.  Do not take these medications if you have prostate enlargement.  Read package instructions thoroughly on all medications that you take.  GET HELP RIGHT AWAY IF:   Symptoms don't go away after treatment.  Severe itching that persists.  If you rash spreads or swells.  If you rash begins to smell.  If it blisters and opens or develops a yellow-brown crust.  You develop a fever.  You have a sore throat.  You become short of breath.  MAKE SURE YOU:  Understand these instructions. Will watch your condition. Will get help right away if you are not doing well or get worse.  Thank you for choosing an e-visit. Your e-visit answers were reviewed by a board certified advanced clinical practitioner to complete your personal care plan. Depending upon the condition, your plan could have included both over the counter or prescription medications. Please review your pharmacy choice. Be sure that the pharmacy you have chosen is open so that you can pick up your prescription now.  If there is a problem you may message your provider in [place] to have the prescription routed to  another pharmacy. Your safety is important to [place]. If you have drug allergies check your prescription carefully.  For the next 24 hours, you can use MyChart to ask questions about today's visit, request a non-urgent call back, or ask for a work or school excuse from your e-visit provider. You will get an email in the next two days asking about your experience. I hope that your e-visit has been valuable and will speed your recovery.

## 2020-11-12 ENCOUNTER — Telehealth: Payer: Medicaid Other | Admitting: Physician Assistant

## 2020-11-12 ENCOUNTER — Telehealth: Payer: Self-pay

## 2020-11-12 DIAGNOSIS — U071 COVID-19: Secondary | ICD-10-CM

## 2020-11-12 LAB — CULTURE, GROUP A STREP (THRC)

## 2020-11-12 MED ORDER — FLUTICASONE PROPIONATE 50 MCG/ACT NA SUSP: 2.0000 | Freq: Every day | NASAL | 0 refills | Status: DC

## 2020-11-12 NOTE — Progress Notes (Signed)
E-Visit for Positive Covid Test Result We are sorry you are not feeling well. We are here to help!  You have tested positive for COVID-19, meaning that you were infected with the novel coronavirus and could give the virus to others.  It is vitally important that you stay home so you do not spread it to others.      Please continue isolation at home, for at least 10 days since the start of your symptoms and until you have had 24 hours with no fever (without taking a fever reducer) and with improving of symptoms.  If you have no symptoms but tested positive (or all symptoms resolve after 5 days and you have no fever) you can leave your house but continue to wear a mask around others for an additional 5 days. If you have a fever,continue to stay home until you have had 24 hours of no fever. Most cases improve 5-10 days from onset but we have seen a small number of patients who have gotten worse after the 10 days.  Please be sure to watch for worsening symptoms and remain taking the proper precautions.   Go to the nearest hospital ED for assessment if fever/cough/breathlessness are severe or illness seems like a threat to life.    The following symptoms may appear 2-14 days after exposure: . Fever . Cough . Shortness of breath or difficulty breathing . Chills . Repeated shaking with chills . Muscle pain . Headache . Sore throat . New loss of taste or smell . Fatigue . Congestion or runny nose . Nausea or vomiting . Diarrhea  You have been enrolled in [facility] for COVID-19. Daily you will receive a questionnaire within the [place] [place] website. Our COVID-19 response team will be monitoring your responses daily.  You can use medication such as prescription for Fluticasone nasal spray 2 sprays in each nostril one time per day  You may also take acetaminophen (Tylenol) as needed for fever.  HOME CARE: . Only take medications as instructed by your medical team. . Drink plenty of  fluids and get plenty of rest. . A steam or ultrasonic humidifier can help if you have congestion.   GET HELP RIGHT AWAY IF YOU HAVE EMERGENCY WARNING SIGNS.  Call 911 or proceed to your closest emergency facility if: . You develop worsening high fever. . Trouble breathing . Bluish lips or face . Persistent pain or pressure in the chest . New confusion . Inability to wake or stay awake . You cough up blood. . Your symptoms become more severe . Inability to hold down food or fluids  This list is not all possible symptoms. Contact your medical provider for any symptoms that are severe or concerning to you.    Your e-visit answers were reviewed by a board certified advanced clinical practitioner to complete your personal care plan.  Depending on the condition, your plan could have included both over the counter or prescription medications.  If there is a problem please reply once you have received a response from your provider.  Your safety is important to [place].  If you have drug allergies check your prescription carefully.    You can use MyChart to ask questions about today's visit, request a non-urgent call back, or ask for a work or school excuse for 24 hours related to this e-Visit. If it has been greater than 24 hours you will need to follow up with your provider, or enter a new e-Visit to address those  concerns. You will get an e-mail in the next two days asking about your experience.  I hope that your e-visit has been valuable and will speed your recovery. Thank you for using e-visits.  I provided 6 minutes of non face-to-face time during this encounter for chart review and documentation.

## 2020-11-12 NOTE — Telephone Encounter (Signed)
Patient had e-visit today, will address questionnaire with patient after today, if worsening symptoms.

## 2020-11-17 ENCOUNTER — Other Ambulatory Visit: Payer: Self-pay

## 2020-11-17 ENCOUNTER — Emergency Department (HOSPITAL_BASED_OUTPATIENT_CLINIC_OR_DEPARTMENT_OTHER): Payer: Medicaid Other

## 2020-11-17 ENCOUNTER — Telehealth: Payer: Medicaid Other | Admitting: Physician Assistant

## 2020-11-17 ENCOUNTER — Encounter (HOSPITAL_BASED_OUTPATIENT_CLINIC_OR_DEPARTMENT_OTHER): Payer: Self-pay | Admitting: Emergency Medicine

## 2020-11-17 ENCOUNTER — Emergency Department (HOSPITAL_BASED_OUTPATIENT_CLINIC_OR_DEPARTMENT_OTHER)
Admission: EM | Admit: 2020-11-17 | Discharge: 2020-11-17 | Disposition: A | Discharge status: Home / Self Care | Payer: Medicaid Other | Attending: Emergency Medicine | Admitting: Emergency Medicine

## 2020-11-17 ENCOUNTER — Encounter: Payer: Self-pay | Admitting: Physician Assistant

## 2020-11-17 DIAGNOSIS — Z8616 Personal history of COVID-19: Secondary | ICD-10-CM | POA: Insufficient documentation

## 2020-11-17 DIAGNOSIS — J1282 Pneumonia due to coronavirus disease 2019: Secondary | ICD-10-CM | POA: Diagnosis not present

## 2020-11-17 DIAGNOSIS — U071 COVID-19: Secondary | ICD-10-CM | POA: Insufficient documentation

## 2020-11-17 DIAGNOSIS — F17210 Nicotine dependence, cigarettes, uncomplicated: Secondary | ICD-10-CM | POA: Diagnosis not present

## 2020-11-17 DIAGNOSIS — Z28310 Unvaccinated for covid-19: Secondary | ICD-10-CM | POA: Insufficient documentation

## 2020-11-17 DIAGNOSIS — I10 Essential (primary) hypertension: Secondary | ICD-10-CM | POA: Diagnosis not present

## 2020-11-17 DIAGNOSIS — J189 Pneumonia, unspecified organism: Secondary | ICD-10-CM

## 2020-11-17 DIAGNOSIS — J029 Acute pharyngitis, unspecified: Secondary | ICD-10-CM | POA: Diagnosis present

## 2020-11-17 LAB — CBC WITH DIFFERENTIAL/PLATELET
Abs Immature Granulocytes: 0.08 10*3/uL — ABNORMAL HIGH (ref 0.00–0.07)
Basophils Absolute: 0.1 10*3/uL (ref 0.0–0.1)
Basophils Relative: 1 %
Eosinophils Absolute: 0.4 10*3/uL (ref 0.0–0.5)
Eosinophils Relative: 3 %
HCT: 41.6 % (ref 36.0–46.0)
Hemoglobin: 13.8 g/dL (ref 12.0–15.0)
Immature Granulocytes: 1 %
Lymphocytes Relative: 20 %
Lymphs Abs: 2.2 10*3/uL (ref 0.7–4.0)
MCH: 29.2 pg (ref 26.0–34.0)
MCHC: 33.2 g/dL (ref 30.0–36.0)
MCV: 87.9 fL (ref 80.0–100.0)
Monocytes Absolute: 0.9 10*3/uL (ref 0.1–1.0)
Monocytes Relative: 8 %
Neutro Abs: 7.5 10*3/uL (ref 1.7–7.7)
Neutrophils Relative %: 67 %
Platelets: 305 10*3/uL (ref 150–400)
RBC: 4.73 MIL/uL (ref 3.87–5.11)
RDW: 12.7 % (ref 11.5–15.5)
WBC: 11.2 10*3/uL — ABNORMAL HIGH (ref 4.0–10.5)
nRBC: 0 % (ref 0.0–0.2)

## 2020-11-17 LAB — COMPREHENSIVE METABOLIC PANEL
ALT: 34 U/L (ref 0–44)
AST: 21 U/L (ref 15–41)
Albumin: 4.2 g/dL (ref 3.5–5.0)
Alkaline Phosphatase: 47 U/L (ref 38–126)
Anion gap: 8 (ref 5–15)
BUN: 8 mg/dL (ref 6–20)
CO2: 29 mmol/L (ref 22–32)
Calcium: 9.1 mg/dL (ref 8.9–10.3)
Chloride: 102 mmol/L (ref 98–111)
Creatinine, Ser: 0.64 mg/dL (ref 0.44–1.00)
GFR, Estimated: >60 mL/min (ref >60)
Glucose, Bld: 101 mg/dL — ABNORMAL HIGH (ref 70–99)
Potassium: 3.6 mmol/L (ref 3.5–5.1)
Sodium: 139 mmol/L (ref 135–145)
Total Bilirubin: 0.6 mg/dL (ref 0.3–1.2)
Total Protein: 6.8 g/dL (ref 6.5–8.1)

## 2020-11-17 MED ORDER — SODIUM CHLORIDE 0.9 % IV BOLUS
1000.0000 mL | Freq: Once | INTRAVENOUS | Status: AC
  Administered 2020-11-17: 1000 mL via INTRAVENOUS

## 2020-11-17 MED ORDER — AMOXICILLIN-POT CLAVULANATE 500-125 MG PO TABS: 2.0000 | ORAL_TABLET | Freq: Three times a day (TID) | ORAL | 0 refills | Status: AC

## 2020-11-17 MED ORDER — ONDANSETRON HCL 4 MG PO TABS: 4.0000 mg | ORAL_TABLET | Freq: Four times a day (QID) | ORAL | 0 refills | Status: DC

## 2020-11-17 NOTE — Patient Instructions (Signed)
1. COVID-19  - given more severe symptoms than previous episodes, especially with dizziness, difficulty walking and ongoing symptoms pt needs a face to face evaluation for further evaluation of vital signs and physical exam  - go to Urgent Care or ED for further evaluation

## 2020-11-17 NOTE — ED Provider Notes (Signed)
[facility] EMERGENCY DEPT Provider Note   CSN: [id number] Arrival date & time: 11/17/20  1450     History Chief Complaint  Patient presents with  . Sore Throat    dizziness  . Abdominal Pain    Christina Fox is a 40 y.o. female.  Christina Fox presents on day 9 of having both flu and COVID-19.  This is her second bout with COVID-19, and she is unvaccinated.  When she had COVID-19 the last time, she required pulmonology follow-up.  She states that at the onset of this illness, she had fever, body aches, nausea, and vomiting.  However, she started to feel a little bit better.  Today she began to worsen again and had a recurrence of a sore throat, some intermittent chest pain, and some intermittent, sharp abdominal pain that moves around to various areas of her abdomen.  She had a video visit, and she was directed to the emergency department.  The history is provided by the patient.  URI Presenting symptoms: cough, fatigue and sore throat   Presenting symptoms: no ear pain and no fever   Severity:  Moderate Onset quality:  Sudden Duration:  9 days Timing:  Constant Progression:  Worsening Chronicity:  New Relieved by:  Nothing Worsened by:  Nothing Ineffective treatments: supplements, OTC medications. Associated symptoms: headaches and neck pain   Associated symptoms: no arthralgias        Past Medical History:  Diagnosis Date  . Abdominal pain    "right lower quadrant pain and right lower back pain  . Anxiety   . Deviated septum 09/2011  . GERD (gastroesophageal reflux disease)   . Headache(784.0)    sinus  . History of echocardiogram    Echo 11/17: EF 55-60, no RWMA, normal diastolic function  . Hypertension    no meds  . Kidney stones    no current problem  . Migraine   . Nasal turbinate hypertrophy 09/2011   bilat.  . Pelvic inflammatory disease   . Urticaria     Patient Active Problem List   Diagnosis Date Noted  . Upper airway  cough syndrome 07/08/2020  . Gallstone 01/07/2016  . History of colonic polyps 11/27/2015  . Right lower quadrant abdominal pain 11/27/2015  . HTN (hypertension), benign 11/24/2015  . Abdominal pain 11/16/2015  . Headache 10/13/2015  . 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 Anxiety disorder 11/03/2014    Past Surgical History:  Procedure Laterality Date  . CHOLECYSTECTOMY N/A 01/15/2016   Procedure: LAPAROSCOPIC CHOLECYSTECTOMY WITH INTRAOPERATIVE CHOLANGIOGRAM;  Surgeon: Christene Lye, MD;  Location: [facility]S;  Service: General;  Laterality: N/A;  . COLONOSCOPY  2007   [facility]: pedunculated 15mm polyp in distal sigmoid, sessile 31mm polyp at splenic flexure, larger polyp tubulovillous adenoma and smaller polyp tubular adenoma   . COLONOSCOPY  09-09-15  . COLONOSCOPY WITH PROPOFOL N/A 03/01/2016   Procedure: COLONOSCOPY WITH PROPOFOL;  Surgeon: Garlan Fair, MD;  Location: [facility];  Service: Endoscopy;  Laterality: N/A;  . NASAL SEPTOPLASTY W/ TURBINOPLASTY  10/04/2011   Procedure: NASAL SEPTOPLASTY WITH TURBINATE REDUCTION;  Surgeon: Ascencion Dike, MD;  Location: [place];  Service: ENT;  Laterality: Bilateral;  . WISDOM TOOTH EXTRACTION  2009     OB History    Gravida  5   Para  4   Term  4   Preterm      AB  1   Living  4     SAB  1   IAB      Ectopic      Multiple      Live Births  4        Obstetric Comments  1st Menstrual Cycle:  10  1st Pregnancy:  18         Family History  Problem Relation Age of Onset  . Kidney cancer Mother   . Hypertension Mother   . Migraines Mother   . Multiple sclerosis Mother   . Fibromyalgia Mother   . Liver disease Mother        NASH   . Hypertension Father   . Liver disease Father        liver transplant; had fatty liver disease  . Spina bifida Brother   . Migraines Maternal Grandmother   . Hypertension Other   . Colon cancer Paternal Grandfather   . Allergic rhinitis Daughter   . Asthma  Daughter   . Allergic rhinitis Son   . Asthma Son   . Angioedema Neg Hx   . Eczema Neg Hx   . Immunodeficiency Neg Hx   . Urticaria Neg Hx     Social History   Tobacco Use  . Smoking status: Current Every Day Smoker    Packs/day: 0.25    Years: 10.00    Pack years: 2.50    Types: Cigarettes  . Smokeless tobacco: Never Used  . Tobacco comment: 2-3 cigarettes a day  Vaping Use  . Vaping Use: Never used  Substance Use Topics  . Alcohol use: Yes    Alcohol/week: 0.0 standard drinks    Comment: ocassionally  . Drug use: No    Home Medications Prior to Admission medications   Medication Sig Start Date End Date Taking? Authorizing Provider  acetaminophen (TYLENOL) 500 MG tablet Take 500-1,000 mg by mouth every 6 (six) hours as needed for mild pain.     [provider]  albuterol (VENTOLIN HFA) 108 (90 Base) MCG/ACT inhaler Inhale 2 puffs into the lungs every 6 (six) hours as needed for wheezing or shortness of breath.    [provider]  clonazePAM (KLONOPIN) 0.5 MG tablet Take 0.5 mg by mouth 2 (two) times daily as needed for anxiety.     [provider]  Drospirenone (SLYND) 4 MG TABS Take 1 tablet by mouth daily. 07/23/20   Shelly Bombard, MD  fluticasone (FLONASE) 50 MCG/ACT nasal spray Place 2 sprays into both nostrils daily. 11/12/20   Mar Daring, PA-C  ibuprofen (ADVIL) 800 MG tablet Take 1 tablet (800 mg total) by mouth every 8 (eight) hours as needed. 07/23/20   Shelly Bombard, MD  Magnesium 500 MG TABS Take 1 tablet by mouth daily. 06/25/20   [provider]  methocarbamol (ROBAXIN) 500 MG tablet Take 1 tablet (500 mg total) by mouth 2 (two) times daily as needed for muscle spasms. 08/25/20   Sponseller, Eugene Garnet R, PA-C  montelukast (SINGULAIR) 10 MG tablet Take 10 mg by mouth at bedtime.    [provider]  mupirocin ointment (BACTROBAN) 2 % Apply 1 application topically 2 (two) times daily. For 5-7 days. 11/09/20    Brunetta Jeans, PA-C  omeprazole (PRILOSEC) 40 MG capsule Take 40 mg by mouth daily.     [provider]  Potassium Chloride Crys ER (KLOR-CON M20 PO) Take 1 tablet by mouth daily. 06/25/20   [provider]  Vitamin D, Ergocalciferol, (DRISDOL) 1.25 MG (50000 UNIT) CAPS capsule Take 50,000 Units  by mouth See admin instructions. Twice weekly    [provider]  escitalopram (LEXAPRO) 10 MG tablet Take 10 mg by mouth daily.    09/02/11  [provider]    Allergies    Clarithromycin, Doxycycline, Dicyclomine, Sulfasalazine, Bentyl [dicyclomine hcl], Haldol [haloperidol], Meclizine, Morphine and related, Toradol [ketorolac tromethamine], Macrobid [nitrofurantoin monohyd macro], Meclizine hcl, Nitrofurantoin, and Sulfa antibiotics  Review of Systems   Review of Systems  Constitutional: Positive for fatigue. Negative for chills and fever.  HENT: Positive for sore throat. Negative for ear pain.   Eyes: Negative for pain and visual disturbance.  Respiratory: Positive for cough. Negative for shortness of breath.   Cardiovascular: Negative for chest pain and palpitations.  Gastrointestinal: Negative for abdominal pain and vomiting.  Genitourinary: Negative for dysuria and hematuria.  Musculoskeletal: Positive for neck pain. Negative for arthralgias and back pain.  Skin: Negative for color change and rash.  Neurological: Positive for headaches. Negative for seizures and syncope.  All other systems reviewed and are negative.   Physical Exam Updated Vital Signs BP (!) 148/91 (BP Location: Right Arm)   Pulse 77   Temp 97.7 F (36.5 C) (Oral)   Resp 14   SpO2 98%   Physical Exam Vitals and nursing note reviewed.  HENT:     Head: Normocephalic and atraumatic.     Nose: No congestion or rhinorrhea.     Mouth/Throat:     Mouth: Mucous membranes are moist.     Pharynx: Posterior oropharyngeal erythema (mild) present.  Eyes:     General: No scleral  icterus. Pulmonary:     Effort: Pulmonary effort is normal. No respiratory distress.  Abdominal:     General: There is no distension.     Palpations: Abdomen is soft.     Tenderness: There is no abdominal tenderness.  Musculoskeletal:     Cervical back: Normal range of motion.  Skin:    General: Skin is warm and dry.  Neurological:     General: No focal deficit present.     Mental Status: She is alert and oriented to person, place, and time.  Psychiatric:        Mood and Affect: Mood normal.     ED Results / Procedures / Treatments   Labs (all labs ordered are listed, but only abnormal results are displayed) Labs Reviewed  COMPREHENSIVE METABOLIC PANEL - Abnormal; Notable for the following components:      Result Value   Glucose, Bld 101 (*)    All other components within normal limits  CBC WITH DIFFERENTIAL/PLATELET - Abnormal; Notable for the following components:   WBC 11.2 (*)    Abs Immature Granulocytes 0.08 (*)    All other components within normal limits    EKG None  Radiology DG Chest Port 1 View  Result Date: 11/17/2020 CLINICAL DATA:  COVID 19 and flu positive, recurrent sore throat EXAM: PORTABLE CHEST 1 VIEW COMPARISON:  Radiograph 09/28/2020, CT 05/27/2020 FINDINGS: Minimal opacity in the periphery of the left lung base, could reflect atelectasis or early airspace disease. Lungs are otherwise clear. No pneumothorax or effusion. The cardiomediastinal contours are unremarkable. No acute osseous or soft tissue abnormality. IMPRESSION: Minimal opacity in periphery left lung base, could reflect atelectasis or airspace disease No other acute cardiopulmonary abnormality Electronically Signed   By: Lovena Le M.D.   On: 11/17/2020 15:50    Procedures Procedures   Medications Ordered in ED Medications  sodium chloride 0.9 % bolus 1,000 mL (  has no administration in time range)    ED Course  I have reviewed the triage vital signs and the nursing  notes.  Pertinent labs & imaging results that were available during my care of the patient were reviewed by me and considered in my medical decision making (see chart for details).    MDM Rules/Calculators/A&P                          Christina Fox presented to the ED on day 9 and illness with COVID-19 and influenza.  She is overall well-appearing with reassuring vital signs.  She is not hypoxic.  Work-up revealed she does have a possible pneumonia at the left lung.  Because influenza can produce a post viral bacterial infection, I have recommended that she take Augmentin.  She was given instructions to follow-up with her primary care provider as well.  Careful return precautions were given. Final Clinical Impression(s) / ED Diagnoses Final diagnoses:  Community acquired pneumonia of left lower lobe of lung    Rx / DC Orders ED Discharge Orders         Ordered    amoxicillin-clavulanate (AUGMENTIN) 500-125 MG tablet  Every 8 hours        11/17/20 1658    ondansetron (ZOFRAN) 4 MG tablet  Every 6 hours        11/17/20 1659           Arnaldo Natal, MD 11/17/20 1701

## 2020-11-17 NOTE — ED Notes (Signed)
Patient verbalizes understanding of discharge instructions. Opportunity for questioning and answers were provided. Armband removed by staff, pt discharged from ED. Pt. ambulatory and discharged home.  

## 2020-11-17 NOTE — Progress Notes (Signed)
Ms. Christina Fox, Christina Fox are scheduled for a virtual visit with your provider today.    Just as we do with appointments in the office, we must obtain your consent to participate.  Your consent will be active for this visit and any virtual visit you may have with one of our providers in the next 365 days.    If you have a MyChart account, I can also send a copy of this consent to you electronically.  All virtual visits are billed to your insurance company just like a traditional visit in the office.  As this is a virtual visit, video technology does not allow for your provider to perform a traditional examination.  This may limit your provider's ability to fully assess your condition.  If your provider identifies any concerns that need to be evaluated in person or the need to arrange testing such as labs, EKG, etc, we will make arrangements to do so.    Although advances in technology are sophisticated, we cannot ensure that it will always work on either your end or our end.  If the connection with a video visit is poor, we may have to switch to a telephone visit.  With either a video or telephone visit, we are not always able to ensure that we have a secure connection.   I need to obtain your verbal consent now.   Are you willing to proceed with your visit today?   Christina Fox has provided verbal consent on 11/17/2020 for a virtual visit (video or telephone).   Christina Soho Alexa Golebiewski, PA-C 11/17/2020  2:10 PM   Date:  11/17/2020   ID:  Christina Fox, DOB January 01, 1981, MRN [id number]  Patient Location: Home Provider Location: Home Office   Participants: Patient and Provider for Visit and Wrap up  Method of visit: Video  Location of Patient: Home Location of Provider: Home Office Consent was obtain for visit over the video. Services rendered by provider: Visit was performed via video  A video enabled telemedicine application was used and I verified that I am speaking with the correct person using  two identifiers.  PCP:  Sherald Hess., MD   Chief Complaint:  COVID and Flu  History of Present Illness:    Christina Fox is a 40 y.o. female with history as stated below. Presents video telehealth for an acute care visit.   Flu test positive on Wednesday COVID test positive on Thursday  Pt reports some abdominal cramping, but no vomiting or diarrhea.  Pt reports she is lightheaded when she attempts to walk.  Pt reports urine is unchanged.   Some shortness of breath when walking.  Pt reports she feels poorly and is also having some chest pain with coughing.   Pt has taken Tamiflu, prednisone, Vitamin C and other herbs.  These courses have been completed.   The patient does have symptoms concerning for COVID-19 infection (fever, chills, cough, or new shortness of breath).  Patient has been tested for COVID during this illness.  Past Medical, Surgical, Social History, Allergies, and Medications have been Reviewed.  Patient Active Problem List   Diagnosis Date Noted  . Upper airway cough syndrome 07/08/2020  . Gallstone 01/07/2016  . History of colonic polyps 11/27/2015  . Right lower quadrant abdominal pain 11/27/2015  . HTN (hypertension), benign 11/24/2015  . Abdominal pain 11/16/2015  . Headache 10/13/2015  . 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 Anxiety disorder 11/03/2014    Social History   Tobacco Use  .  Smoking status: Current Every Day Smoker    Packs/day: 0.25    Years: 10.00    Pack years: 2.50    Types: Cigarettes  . Smokeless tobacco: Never Used  . Tobacco comment: 2-3 cigarettes a day  Substance Use Topics  . Alcohol use: Yes    Alcohol/week: 0.0 standard drinks    Comment: ocassionally     Current Outpatient Medications:  .  acetaminophen (TYLENOL) 500 MG tablet, Take 500-1,000 mg by mouth every 6 (six) hours as needed for mild pain. , Disp: , Rfl:  .  albuterol (VENTOLIN HFA) 108 (90 Base) MCG/ACT inhaler, Inhale 2 puffs into the lungs every 6  (six) hours as needed for wheezing or shortness of breath., Disp: , Rfl:  .  clonazePAM (KLONOPIN) 0.5 MG tablet, Take 0.5 mg by mouth 2 (two) times daily as needed for anxiety. , Disp: , Rfl:  .  Drospirenone (SLYND) 4 MG TABS, Take 1 tablet by mouth daily., Disp: 28 tablet, Rfl: 11 .  fluticasone (FLONASE) 50 MCG/ACT nasal spray, Place 2 sprays into both nostrils daily., Disp: 16 g, Rfl: 0 .  ibuprofen (ADVIL) 800 MG tablet, Take 1 tablet (800 mg total) by mouth every 8 (eight) hours as needed., Disp: 30 tablet, Rfl: 5 .  Magnesium 500 MG TABS, Take 1 tablet by mouth daily., Disp: , Rfl:  .  methocarbamol (ROBAXIN) 500 MG tablet, Take 1 tablet (500 mg total) by mouth 2 (two) times daily as needed for muscle spasms., Disp: 20 tablet, Rfl: 0 .  montelukast (SINGULAIR) 10 MG tablet, Take 10 mg by mouth at bedtime., Disp: , Rfl:  .  mupirocin ointment (BACTROBAN) 2 %, Apply 1 application topically 2 (two) times daily. For 5-7 days., Disp: 22 g, Rfl: 0 .  omeprazole (PRILOSEC) 40 MG capsule, Take 40 mg by mouth daily. , Disp: , Rfl:  .  Potassium Chloride Crys ER (KLOR-CON M20 PO), Take 1 tablet by mouth daily., Disp: , Rfl:  .  Vitamin D, Ergocalciferol, (DRISDOL) 1.25 MG (50000 UNIT) CAPS capsule, Take 50,000 Units by mouth See admin instructions. Twice weekly, Disp: , Rfl:    Allergies  Allergen Reactions  . Clarithromycin Hives and Itching  . Doxycycline Shortness Of Breath    Chest pain  . Dicyclomine Other (See Comments) and Rash    agitation   . Sulfasalazine Rash and Hives  . Bentyl [Dicyclomine Hcl] Other (See Comments)    DIZZINESS  . Haldol [Haloperidol] Other (See Comments)    Made her feel very jittery and agitated  . Meclizine     Chest pain  . Morphine And Related Other (See Comments)    Made her feel like she was losing her mind  . Toradol [Ketorolac Tromethamine] Other (See Comments)    Made her feel very jittery and agitated  . Macrobid [Nitrofurantoin Monohyd Macro]  Rash  . Meclizine Hcl Rash  . Nitrofurantoin Rash  . Sulfa Antibiotics Hives and Rash     Review of Systems  Constitutional: Positive for malaise/fatigue. Negative for chills and fever.  HENT: Positive for congestion. Negative for ear pain and sore throat.   Eyes: Negative for blurred vision and double vision.  Respiratory: Positive for cough. Negative for shortness of breath and wheezing.   Cardiovascular: Positive for chest pain and palpitations. Negative for leg swelling.  Gastrointestinal: Negative for abdominal pain, diarrhea, nausea and vomiting.  Genitourinary: Negative for dysuria.  Musculoskeletal: Negative for myalgias.  Skin: Negative for rash.  Neurological:  Positive for dizziness and headaches. Negative for loss of consciousness and weakness.  Psychiatric/Behavioral: The patient is not nervous/anxious.    See HPI for history of present illness.  Physical Exam Constitutional:      General: She is not in acute distress.    Appearance: Normal appearance. She is not ill-appearing.  HENT:     Head: Normocephalic and atraumatic.     Nose: Congestion present.  Eyes:     Extraocular Movements: Extraocular movements intact.  Pulmonary:     Effort: Pulmonary effort is normal.  Musculoskeletal:        General: Normal range of motion.     Cervical back: Normal range of motion.  Skin:    Coloration: Skin is pale.  Neurological:     General: No focal deficit present.     Mental Status: She is alert. Mental status is at baseline.  Psychiatric:        Mood and Affect: Mood normal.               A&P  1. COVID-19  - given more severe symptoms than previous episodes, especially with dizziness, difficulty walking and ongoing symptoms pt needs a face to face evaluation for further evaluation of vital signs and physical exam   Patient voiced understanding and agreement to plan.   Time:   Today, I have spent 15 minutes with the patient with telehealth technology  discussing the above problems, reviewing the chart, previous notes, medications and orders.    Tests Ordered: No orders of the defined types were placed in this encounter.   Medication Changes: No orders of the defined types were placed in this encounter.    Disposition:  Follow up in the ER for further evaluation  Signed, Abigail Butts, PA-C  11/17/2020 2:10 PM

## 2020-11-17 NOTE — ED Triage Notes (Signed)
covid positive x 9 days , recurrent sore throat .  6 loose stools today, dizziness. Sent her for further evaluation.

## 2020-11-18 ENCOUNTER — Telehealth: Payer: Self-pay | Admitting: Pulmonary Disease

## 2020-11-18 NOTE — Telephone Encounter (Signed)
ATC Patient to follow up on scheduling telephone visit with Eustaquio Maize, NP. LM to call back. Patient was seen yesterday by PA at PCP and [facility] ED. Patient is covid positive, covid pna.

## 2020-11-18 NOTE — Telephone Encounter (Signed)
Pt is scheduled for 5/27.  in person is okay.

## 2020-11-19 ENCOUNTER — Telehealth: Payer: Self-pay | Admitting: Primary Care

## 2020-11-19 NOTE — Telephone Encounter (Signed)
ATC LVMTCB. If pt calls back pt's apt on 11/19/20 needs to be changed to televisit. She can not come into office r/t Covid status.

## 2020-11-19 NOTE — Telephone Encounter (Signed)
Pt is returning call-- would not like televisit at this perfered to come in person will resch.

## 2020-11-20 ENCOUNTER — Ambulatory Visit: Payer: Medicaid Other | Admitting: Primary Care

## 2020-11-27 ENCOUNTER — Ambulatory Visit: Payer: Medicaid Other | Admitting: Primary Care

## 2020-11-27 NOTE — Progress Notes (Deleted)
@Patient  ID: Christina Fox, female    DOB: [birth year], 40 y.o.   MRN: [id number]  No chief complaint on file.   Referring provider: Sherald Hess., MD  HPI: 40 year old female, current everyday smoker.  Past medical history significant for hypertension, gallstone, thyromegaly, headache, anxiety disorder, non-seasonal allergic rhinitis.  Dr. Loanne Drilling, initial consult on 06/02/2020 for chronic cough.  Patient has prior history of COVID.  Patient was started on Symbicort 162 puffs twice daily.  Commended to continue Xyzal, Singulair and Flonase.  Ordered for PFTs.   Previous [facility] pulmonary encounter:  07/08/2020 Patient presents today for 1 month follow-up with PFTs. She noticed some improvement in cough while taking Symbicort. She stopped about 2 weeks ago and cough recently return. Right now she is dealing with a lot of sinus congestion and pressure. She is compliant with Xyzal, Singulair and flonase. She has a lot of allergy symptoms at baseline was treated for sinusitis with this fall. Hx prior surgeries. She has seen ENT several times in the past. She has known thyromegaly and underwent ultrasound and bx in 2018. No acute complaints of shortness of breath, chest tightness or wheezing.   11/27/2020- Interim hx  Patient was seen at [facility] and dx with CAP LLL on 5/24, covid/ influenza positive 11/07/20. She was not hypoxic. She was started on oral Augmentin.      Testing: 11/03/16 allergy panel- Negative 05/01/2020 eosinophil absolute - 400 06/02/2020 IgE- 40 07/08/2020 FENO - 9  Pulmonary function testing: 07/08/2020 PFT- FVC 2.81 (77%), FEV1 2.24 (75%), ratio 80, TLC 88%, DLCOunc 20.71 (96%)  Imaging: 05/27/20 CTA  - No pulmonary embolism. Normal parenchyma 02/09/17 Thyoid ultrasound- left upper pole nodule previously measured up to 1.7 cm and today measures 1.1 cm. It has considerably reduced in size. It underwent biopsy 03/26/2013.   Allergies  Allergen  Reactions  . Clarithromycin Hives and Itching  . Doxycycline Shortness Of Breath    Chest pain  . Dicyclomine Other (See Comments) and Rash    agitation   . Sulfasalazine Rash and Hives  . Bentyl [Dicyclomine Hcl] Other (See Comments)    DIZZINESS  . Haldol [Haloperidol] Other (See Comments)    Made her feel very jittery and agitated  . Meclizine     Chest pain  . Morphine And Related Other (See Comments)    Made her feel like she was losing her mind  . Toradol [Ketorolac Tromethamine] Other (See Comments)    Made her feel very jittery and agitated  . Macrobid [Nitrofurantoin Monohyd Macro] Rash  . Meclizine Hcl Rash  . Nitrofurantoin Rash  . Sulfa Antibiotics Hives and Rash    Immunization History  Administered Date(s) Administered  . Tdap 06/01/2012    Past Medical History:  Diagnosis Date  . Abdominal pain    "right lower quadrant pain and right lower back pain  . Anxiety   . Deviated septum 09/2011  . GERD (gastroesophageal reflux disease)   . Headache(784.0)    sinus  . History of echocardiogram    Echo 11/17: EF 55-60, no RWMA, normal diastolic function  . Hypertension    no meds  . Kidney stones    no current problem  . Migraine   . Nasal turbinate hypertrophy 09/2011   bilat.  . Pelvic inflammatory disease   . Urticaria     Tobacco History: Social History   Tobacco Use  Smoking Status Current Every Day Smoker  . Packs/day: 0.25  .  Years: 10.00  . Pack years: 2.50  . Types: Cigarettes  Smokeless Tobacco Never Used  Tobacco Comment   2-3 cigarettes a day   Ready to quit: Not Answered Counseling given: Not Answered Comment: 2-3 cigarettes a day   Outpatient Medications Prior to Visit  Medication Sig Dispense Refill  . acetaminophen (TYLENOL) 500 MG tablet Take 500-1,000 mg by mouth every 6 (six) hours as needed for mild pain.     Marland Kitchen albuterol (VENTOLIN HFA) 108 (90 Base) MCG/ACT inhaler Inhale 2 puffs into the lungs every 6 (six) hours as  needed for wheezing or shortness of breath.    . clonazePAM (KLONOPIN) 0.5 MG tablet Take 0.5 mg by mouth 2 (two) times daily as needed for anxiety.     . Drospirenone (SLYND) 4 MG TABS Take 1 tablet by mouth daily. 28 tablet 11  . fluticasone (FLONASE) 50 MCG/ACT nasal spray Place 2 sprays into both nostrils daily. 16 g 0  . ibuprofen (ADVIL) 800 MG tablet Take 1 tablet (800 mg total) by mouth every 8 (eight) hours as needed. 30 tablet 5  . Magnesium 500 MG TABS Take 1 tablet by mouth daily.    . methocarbamol (ROBAXIN) 500 MG tablet Take 1 tablet (500 mg total) by mouth 2 (two) times daily as needed for muscle spasms. 20 tablet 0  . montelukast (SINGULAIR) 10 MG tablet Take 10 mg by mouth at bedtime.    . mupirocin ointment (BACTROBAN) 2 % Apply 1 application topically 2 (two) times daily. For 5-7 days. 22 g 0  . omeprazole (PRILOSEC) 40 MG capsule Take 40 mg by mouth daily.     . ondansetron (ZOFRAN) 4 MG tablet Take 1 tablet (4 mg total) by mouth every 6 (six) hours. 12 tablet 0  . Potassium Chloride Crys ER (KLOR-CON M20 PO) Take 1 tablet by mouth daily.    . Vitamin D, Ergocalciferol, (DRISDOL) 1.25 MG (50000 UNIT) CAPS capsule Take 50,000 Units by mouth See admin instructions. Twice weekly     No facility-administered medications prior to visit.      Review of Systems  Review of Systems   Physical Exam  There were no vitals taken for this visit. Physical Exam   Lab Results:  CBC    Component Value Date/Time   WBC 11.2 (H) 11/17/2020 1533   RBC 4.73 11/17/2020 1533   HGB 13.8 11/17/2020 1533   HGB 12.6 03/29/2016 1703   HCT 41.6 11/17/2020 1533   HCT 38.3 03/29/2016 1703   PLT 305 11/17/2020 1533   PLT 347 03/29/2016 1703   MCV 87.9 11/17/2020 1533   MCV 88 03/29/2016 1703   MCH 29.2 11/17/2020 1533   MCHC 33.2 11/17/2020 1533   RDW 12.7 11/17/2020 1533   RDW 13.0 03/29/2016 1703   LYMPHSABS 2.2 11/17/2020 1533   MONOABS 0.9 11/17/2020 1533   EOSABS 0.4  11/17/2020 1533   BASOSABS 0.1 11/17/2020 1533    BMET    Component Value Date/Time   NA 139 11/17/2020 1533   NA 140 08/31/2016 1637   K 3.6 11/17/2020 1533   CL 102 11/17/2020 1533   CO2 29 11/17/2020 1533   GLUCOSE 101 (H) 11/17/2020 1533   BUN 8 11/17/2020 1533   BUN 7 08/31/2016 1637   CREATININE 0.64 11/17/2020 1533   CREATININE 0.74 01/28/2016 1431   CALCIUM 9.1 11/17/2020 1533   GFRNONAA >60 11/17/2020 1533   GFRNONAA >89 11/24/2015 1644   GFRAA >60 03/26/2020 1830  GFRAA >89 11/24/2015 1644    BNP No results found for: BNP  ProBNP No results found for: PROBNP  Imaging: DG Chest Port 1 View  Result Date: 11/17/2020 CLINICAL DATA:  COVID 19 and flu positive, recurrent sore throat EXAM: PORTABLE CHEST 1 VIEW COMPARISON:  Radiograph 09/28/2020, CT 05/27/2020 FINDINGS: Minimal opacity in the periphery of the left lung base, could reflect atelectasis or early airspace disease. Lungs are otherwise clear. No pneumothorax or effusion. The cardiomediastinal contours are unremarkable. No acute osseous or soft tissue abnormality. IMPRESSION: Minimal opacity in periphery left lung base, could reflect atelectasis or airspace disease No other acute cardiopulmonary abnormality Electronically Signed   By: Lovena Le M.D.   On: 11/17/2020 15:50     Assessment & Plan:   No problem-specific Assessment & Plan notes found for this encounter.     Martyn Ehrich, NP 11/27/2020

## 2020-11-29 ENCOUNTER — Telehealth: Payer: Medicaid Other | Admitting: Family

## 2020-11-29 NOTE — Progress Notes (Signed)
   Virtual Visit  Note Due to COVID-19 pandemic this visit was conducted virtually. This visit type was conducted due to national recommendations for restrictions regarding the COVID-19 Pandemic (e.g. social distancing, sheltering in place) in an effort to limit this patient's exposure and mitigate transmission in our community. All issues noted in this document were discussed and addressed.  A physical exam was not performed with this format.  I connected with Christina Fox on 11/29/20 at 4:36 pm  by telephone and verified that I am speaking with the correct person using two identifiers. Christina Fox is currently located at car and  No one is currently with her during visit. The provider, Evelina Dun, FNP is located in their office at time of visit.  I discussed the limitations, risks, security and privacy concerns of performing an evaluation and management service by telephone and the availability of in person appointments. I also discussed with the patient that there may be a patient responsible charge related to this service. The patient expressed understanding and agreed to proceed.   Video was disconnected and unable to reconnect.  Attempted several times. Will close chart out.    Evelina Dun, FNP

## 2020-12-04 ENCOUNTER — Telehealth: Payer: Medicaid Other | Admitting: Physician Assistant

## 2020-12-04 DIAGNOSIS — J029 Acute pharyngitis, unspecified: Secondary | ICD-10-CM | POA: Diagnosis not present

## 2020-12-04 NOTE — Addendum Note (Signed)
Addended by: Mar Daring on: 12/04/2020 01:50 PM   Modules accepted: Orders

## 2020-12-04 NOTE — Progress Notes (Signed)
  E-Visit for Sore Throat  We are sorry that you are not feeling well.  Here is how we plan to help!  Your symptoms indicate a likely viral infection (Pharyngitis).   Pharyngitis is inflammation in the back of the throat which can cause a sore throat, scratchiness and sometimes difficulty swallowing.   Pharyngitis is typically caused by a respiratory virus and will just run its course.  Please keep in mind that your symptoms could last up to 10 days.  For throat pain, we recommend over the counter oral pain relief medications such as acetaminophen or aspirin, or anti-inflammatory medications such as ibuprofen or naproxen sodium.  Topical treatments such as oral throat lozenges or sprays may be used as needed.  Avoid close contact with loved ones, especially the very young and elderly.  Remember to wash your hands thoroughly throughout the day as this is the number one way to prevent the spread of infection and wipe down door knobs and counters with disinfectant.  After careful review of your answers, I would not recommend and antibiotic for your condition.  Antibiotics should not be used to treat conditions that we suspect are caused by viruses like the virus that causes the common cold or flu. However, some people can have Strep with atypical symptoms. You may need formal testing in clinic or office to confirm if your symptoms continue or worsen.  Providers prescribe antibiotics to treat infections caused by bacteria. Antibiotics are very powerful in treating bacterial infections when they are used properly.  To maintain their effectiveness, they should be used only when necessary.  Overuse of antibiotics has resulted in the development of super bugs that are resistant to treatment!    Home Care: Only take medications as instructed by your medical team. Do not drink alcohol while taking these medications. A steam or ultrasonic humidifier can help congestion.  You can place a towel over your head and  breathe in the steam from hot water coming from a faucet. Avoid close contacts especially the very young and the elderly. Cover your mouth when you cough or sneeze. Always remember to wash your hands.  Get Help Right Away If: You develop worsening fever or throat pain. You develop a severe head ache or visual changes. Your symptoms persist after you have completed your treatment plan.  Make sure you Understand these instructions. Will watch your condition. Will get help right away if you are not doing well or get worse.  Your e-visit answers were reviewed by a board certified advanced clinical practitioner to complete your personal care plan.  Depending on the condition, your plan could have included both over the counter or prescription medications.  If there is a problem please reply  once you have received a response from your provider.  Your safety is important to [place].  If you have drug allergies check your prescription carefully.    You can use MyChart to ask questions about todays visit, request a non-urgent call back, or ask for a work or school excuse for 24 hours related to this e-Visit. If it has been greater than 24 hours you will need to follow up with your provider, or enter a new e-Visit to address those concerns.  You will get an e-mail in the next two days asking about your experience.  I hope that your e-visit has been valuable and will speed your recovery. Thank you for using e-visits.

## 2020-12-04 NOTE — Progress Notes (Signed)
I have spent 5 minutes in review of e-visit questionnaire, review and updating patient chart, medical decision making and response to patient.   Zakariye Nee Cody Krishawna Stiefel, PA-C    

## 2020-12-31 ENCOUNTER — Telehealth: Payer: Medicaid Other | Admitting: Physician Assistant

## 2020-12-31 DIAGNOSIS — J019 Acute sinusitis, unspecified: Secondary | ICD-10-CM

## 2020-12-31 DIAGNOSIS — B9689 Other specified bacterial agents as the cause of diseases classified elsewhere: Secondary | ICD-10-CM | POA: Diagnosis not present

## 2021-01-01 MED ORDER — AMOXICILLIN-POT CLAVULANATE 875-125 MG PO TABS: 1.0000 | ORAL_TABLET | Freq: Two times a day (BID) | ORAL | 0 refills | Status: DC

## 2021-01-01 NOTE — Addendum Note (Signed)
Addended by: Mar Daring on: 01/01/2021 12:01 PM   Modules accepted: Orders

## 2021-01-01 NOTE — Progress Notes (Signed)

## 2021-01-01 NOTE — Addendum Note (Signed)
Addended by: Mar Daring on: 01/01/2021 05:18 PM   Modules accepted: Orders

## 2021-01-05 ENCOUNTER — Telehealth: Payer: Medicaid Other | Admitting: Physician Assistant

## 2021-01-05 DIAGNOSIS — K120 Recurrent oral aphthae: Secondary | ICD-10-CM

## 2021-01-05 MED ORDER — LIDOCAINE VISCOUS HCL 2 % MT SOLN: OROMUCOSAL | 0 refills | Status: DC

## 2021-01-05 NOTE — Progress Notes (Signed)
I have spent 5 minutes in review of e-visit questionnaire, review and updating patient chart, medical decision making and response to patient.   Jomayra Novitsky Cody Luca Dyar, PA-C    

## 2021-01-05 NOTE — Addendum Note (Signed)
Addended by: Brunetta Jeans on: 01/05/2021 12:47 PM   Modules accepted: Orders

## 2021-01-05 NOTE — Progress Notes (Signed)
E-Visit for Mouth Ulcers  We are sorry that you are not feeling well.  Here is how we plan to help!  Based on what you have shared with me, it appears that you do have mouth ulcer(s).     The following medications should decrease the discomfort and help with healing. Biotene mouthwash 3 times daily (Available over the counter), Orajel (Available over the counter), and Multivitamin daily. There is also a great product called Corinna Gab you can get over the counter that has a brush tip that you apply directly to the ulcers to numb them and ease pain.   Mouth ulcers are painful areas in the mouth and gums. These are also known as "canker sores".  They can occur anywhere inside the mouth. While mostly harmless, mouth ulcers can be extremely uncomfortable and may make it difficult to eat, drink, and brush your teeth.  You may have more than 1 ulcer and they can vary and change in size. Mouth ulcers are not contagious and should not be confused with cold sores.  Cold sores appear on the lip or around the outside of the mouth and often begin with a tingling, burning or itching sensation.   While the exact causes are unknown, some common causes and factors that may aggravate mouth ulcers include: Genetics - Sometimes mouth ulcers run in families High alcohol intake Acidic foods such as citrus fruits like pineapple, grapefruit, orange fruits/juices, may aggravate mouth ulcers Other foods high in acidity or spice such as coffee, chocolate, chips, pretzels, eggs, nuts, cheese Quitting smoking Injury caused by biting the tongue or inside of the cheek Diet lacking in T-02, zinc, folic acid or iron Female hormone shifts with menstruation Excessive fatigue, emotional stress or anxiety Prevention: Talk to your doctor if you are taking meds that are known to cause mouth ulcers such as:   Anti-inflammatory drugs (for example Ibuprofen, Naproxen sodium), pain killers, Beta blockers, Oral nicotine replacement drugs,  Some street drugs (heroin).   Avoid allowing any tablets to dissolve in your mouth that are meant to swallowed whole Avoid foods/drinks that trigger or worsen symptoms Keep your mouth clean with daily brushing and flossing  Home Care: The goal with treatment is to ease the pain where ulcers occur and help them heal as quickly as possible.  There is no medical treatment to prevent mouth ulcers from coming back or recurring.  Avoid spicy and acidic foods Eat soft foods and avoid rough, crunchy foods Avoid chewing gum Do not use toothpaste that contains sodium lauryl sulphite Use a straw to drink which helps avoid liquids toughing the ulcers near the front of your mouth Use a very soft toothbrush If you have dentures or dental hardware that you feel is not fitting well or contributing to his, please see your dentist. Use saltwater mouthwash which helps healing. Dissolve a  teaspoon of salt in a glass of warm water. Swish around your mouth and spit it out. This can be used as needed if it is soothing.   GET HELP RIGHT AWAY IF: Persistent ulcers require checking IN PERSON (face to face). Any mouth lesion lasting longer than a month should be seen by your DENTIST as soon as possible for evaluation for possible oral cancer. If you have a non-painful ulcer in 1 or more areas of your mouth Ulcers that are spreading, are very large or particularly painful Ulcers last longer than one week without improving on treatment If you develop a fever, swollen glands and begin  to feel unwell Ulcers that developed after starting a new medication MAKE SURE YOU: Understand these instructions. Will watch your condition. Will get help right away if you are not doing well or get worse.  Thank you for choosing an e-visit.  Your e-visit answers were reviewed by a board certified advanced clinical practitioner to complete your personal care plan. Depending upon the condition, your plan could have included both over  the counter or prescription medications.  Please review your pharmacy choice. Make sure the pharmacy is open so you can pick up prescription now. If there is a problem, you may contact your provider through [employer] and have the prescription routed to another pharmacy.  Your safety is important to [place]. If you have drug allergies check your prescription carefully.   For the next 24 hours you can use MyChart to ask questions about today's visit, request a non-urgent call back, or ask for a work or school excuse. You will get an email in the next two days asking about your experience. I hope that your e-visit has been valuable and will speed your recovery.

## 2021-02-08 ENCOUNTER — Telehealth: Payer: Medicaid Other | Admitting: Family Medicine

## 2021-02-08 ENCOUNTER — Encounter: Payer: Self-pay | Admitting: Family Medicine

## 2021-02-08 DIAGNOSIS — B9789 Other viral agents as the cause of diseases classified elsewhere: Secondary | ICD-10-CM

## 2021-02-08 DIAGNOSIS — J019 Acute sinusitis, unspecified: Secondary | ICD-10-CM

## 2021-02-08 MED ORDER — FLUTICASONE PROPIONATE 50 MCG/ACT NA SUSP: 2.0000 | Freq: Every day | NASAL | 6 refills | Status: DC

## 2021-02-08 NOTE — Progress Notes (Signed)
E-Visit for Sinus Problems  We are sorry that you are not feeling well.  Here is how we plan to help!  Based on what you have shared with me it looks like you have sinusitis.  Sinusitis is inflammation and infection in the sinus cavities of the head.  Based on your presentation I believe you most likely have Acute Viral Sinusitis.This is an infection most likely caused by a virus. There is not specific treatment for viral sinusitis other than to help you with the symptoms until the infection runs its course.  You may use an oral decongestant such as Mucinex D or if you have glaucoma or high blood pressure use plain Mucinex. Saline nasal spray help and can safely be used as often as needed for congestion, I have prescribed: Fluticasone nasal spray two sprays in each nostril once a day  Some authorities believe that zinc sprays or the use of Echinacea may shorten the course of your symptoms.  Sinus infections are not as easily transmitted as other respiratory infection, however we still recommend that you avoid close contact with loved ones, especially the very young and elderly.  Remember to wash your hands thoroughly throughout the day as this is the number one way to prevent the spread of infection!  Home Care: Only take medications as instructed by your medical team. Do not take these medications with alcohol. A steam or ultrasonic humidifier can help congestion.  You can place a towel over your head and breathe in the steam from hot water coming from a faucet. Avoid close contacts especially the very young and the elderly. Cover your mouth when you cough or sneeze. Always remember to wash your hands.  Get Help Right Away If: You develop worsening fever or sinus pain. You develop a severe head ache or visual changes. Your symptoms persist after you have completed your treatment plan.  Make sure you Understand these instructions. Will watch your condition. Will get help right away if you  are not doing well or get worse.   Thank you for choosing an e-visit.  Your e-visit answers were reviewed by a board certified advanced clinical practitioner to complete your personal care plan. Depending upon the condition, your plan could have included both over the counter or prescription medications.  Please review your pharmacy choice. Make sure the pharmacy is open so you can pick up prescription now. If there is a problem, you may contact your provider through MyChart messaging and have the prescription routed to another pharmacy.  Your safety is important to us. If you have drug allergies check your prescription carefully.   For the next 24 hours you can use MyChart to ask questions about today's visit, request a non-urgent call back, or ask for a work or school excuse. You will get an email in the next two days asking about your experience. I hope that your e-visit has been valuable and will speed your recovery.  I provided 5 minutes of non face-to-face time during this encounter for chart review, medication and order placement, as well as and documentation.   

## 2021-05-23 ENCOUNTER — Emergency Department (HOSPITAL_BASED_OUTPATIENT_CLINIC_OR_DEPARTMENT_OTHER)
Admission: EM | Admit: 2021-05-23 | Discharge: 2021-05-23 | Disposition: A | Discharge status: Home / Self Care | Payer: Medicaid Other | Attending: Emergency Medicine | Admitting: Emergency Medicine

## 2021-05-23 ENCOUNTER — Encounter (HOSPITAL_BASED_OUTPATIENT_CLINIC_OR_DEPARTMENT_OTHER): Payer: Self-pay

## 2021-05-23 ENCOUNTER — Telehealth: Payer: Medicaid Other | Admitting: Emergency Medicine

## 2021-05-23 ENCOUNTER — Other Ambulatory Visit: Payer: Self-pay

## 2021-05-23 DIAGNOSIS — R197 Diarrhea, unspecified: Secondary | ICD-10-CM

## 2021-05-23 DIAGNOSIS — Z20822 Contact with and (suspected) exposure to covid-19: Secondary | ICD-10-CM | POA: Diagnosis not present

## 2021-05-23 DIAGNOSIS — Z7952 Long term (current) use of systemic steroids: Secondary | ICD-10-CM | POA: Insufficient documentation

## 2021-05-23 DIAGNOSIS — K219 Gastro-esophageal reflux disease without esophagitis: Secondary | ICD-10-CM | POA: Diagnosis not present

## 2021-05-23 DIAGNOSIS — R112 Nausea with vomiting, unspecified: Secondary | ICD-10-CM | POA: Diagnosis not present

## 2021-05-23 DIAGNOSIS — I10 Essential (primary) hypertension: Secondary | ICD-10-CM | POA: Diagnosis not present

## 2021-05-23 DIAGNOSIS — R1013 Epigastric pain: Secondary | ICD-10-CM | POA: Diagnosis present

## 2021-05-23 DIAGNOSIS — R109 Unspecified abdominal pain: Secondary | ICD-10-CM | POA: Diagnosis not present

## 2021-05-23 DIAGNOSIS — Z79899 Other long term (current) drug therapy: Secondary | ICD-10-CM | POA: Diagnosis not present

## 2021-05-23 DIAGNOSIS — F17210 Nicotine dependence, cigarettes, uncomplicated: Secondary | ICD-10-CM | POA: Diagnosis not present

## 2021-05-23 LAB — CBC
HCT: 41.3 % (ref 36.0–46.0)
Hemoglobin: 13.6 g/dL (ref 12.0–15.0)
MCH: 28.9 pg (ref 26.0–34.0)
MCHC: 32.9 g/dL (ref 30.0–36.0)
MCV: 87.7 fL (ref 80.0–100.0)
Platelets: 323 10*3/uL (ref 150–400)
RBC: 4.71 MIL/uL (ref 3.87–5.11)
RDW: 13 % (ref 11.5–15.5)
WBC: 11 10*3/uL — ABNORMAL HIGH (ref 4.0–10.5)
nRBC: 0 % (ref 0.0–0.2)

## 2021-05-23 LAB — COMPREHENSIVE METABOLIC PANEL
ALT: 34 U/L (ref 0–44)
AST: 19 U/L (ref 15–41)
Albumin: 4.5 g/dL (ref 3.5–5.0)
Alkaline Phosphatase: 71 U/L (ref 38–126)
Anion gap: 10 (ref 5–15)
BUN: 9 mg/dL (ref 6–20)
CO2: 27 mmol/L (ref 22–32)
Calcium: 9.8 mg/dL (ref 8.9–10.3)
Chloride: 102 mmol/L (ref 98–111)
Creatinine, Ser: 0.74 mg/dL (ref 0.44–1.00)
GFR, Estimated: >60 mL/min (ref >60)
Glucose, Bld: 87 mg/dL (ref 70–99)
Potassium: 3.3 mmol/L — ABNORMAL LOW (ref 3.5–5.1)
Sodium: 139 mmol/L (ref 135–145)
Total Bilirubin: 0.7 mg/dL (ref 0.3–1.2)
Total Protein: 7.3 g/dL (ref 6.5–8.1)

## 2021-05-23 LAB — URINALYSIS, ROUTINE W REFLEX MICROSCOPIC
Bilirubin Urine: NEGATIVE
Glucose, UA: NEGATIVE mg/dL
Hgb urine dipstick: NEGATIVE
Ketones, ur: NEGATIVE mg/dL
Leukocytes,Ua: NEGATIVE
Nitrite: NEGATIVE
Protein, ur: NEGATIVE mg/dL
Specific Gravity, Urine: 1.009 (ref 1.005–1.030)
pH: 6 (ref 5.0–8.0)

## 2021-05-23 LAB — RESP PANEL BY RT-PCR (FLU A&B, COVID) ARPGX2
Influenza A by PCR: NEGATIVE
Influenza B by PCR: NEGATIVE
SARS Coronavirus 2 by RT PCR: NEGATIVE

## 2021-05-23 LAB — LIPASE, BLOOD: Lipase: 34 U/L (ref 11–51)

## 2021-05-23 LAB — PREGNANCY, URINE: Preg Test, Ur: NEGATIVE

## 2021-05-23 MED ORDER — LIDOCAINE VISCOUS HCL 2 % MT SOLN
15.0000 mL | Freq: Once | OROMUCOSAL | Status: AC
  Administered 2021-05-23: 18:00:00 15 mL via ORAL
  Filled 2021-05-23: qty 15

## 2021-05-23 MED ORDER — SODIUM CHLORIDE 0.9 % IV BOLUS
1000.0000 mL | Freq: Once | INTRAVENOUS | Status: AC
  Administered 2021-05-23: 17:00:00 1000 mL via INTRAVENOUS

## 2021-05-23 MED ORDER — PROMETHAZINE HCL 25 MG RE SUPP: 25.0000 mg | Freq: Three times a day (TID) | RECTAL | 0 refills | Status: DC | PRN

## 2021-05-23 MED ORDER — ALUM & MAG HYDROXIDE-SIMETH 200-200-20 MG/5ML PO SUSP
30.0000 mL | Freq: Once | ORAL | Status: AC
  Administered 2021-05-23: 18:00:00 30 mL via ORAL
  Filled 2021-05-23: qty 30

## 2021-05-23 MED ORDER — PROMETHAZINE HCL 25 MG/ML IJ SOLN
INTRAMUSCULAR | Status: AC
  Filled 2021-05-23: qty 1

## 2021-05-23 MED ORDER — SODIUM CHLORIDE 0.9 % IV SOLN
25.0000 mg | Freq: Four times a day (QID) | INTRAVENOUS | Status: DC | PRN
  Administered 2021-05-23: 17:00:00 25 mg via INTRAVENOUS
  Filled 2021-05-23: qty 1

## 2021-05-23 MED ORDER — ONDANSETRON HCL 4 MG PO TABS: 4.0000 mg | ORAL_TABLET | Freq: Three times a day (TID) | ORAL | 0 refills | Status: DC | PRN

## 2021-05-23 NOTE — ED Triage Notes (Signed)
Pt presents with upper abd pain and nausea x3 days. Pt reports "profusely" vomiting all morning Friday morning, has not vomited since. Pt did an e-visit today, prescribed Zofran, pt not having any relief. Advised to come to ED for further evaluation

## 2021-05-23 NOTE — ED Provider Notes (Signed)
[facility] DEPT Provider Note   CSN: [id number] Arrival date & time: 05/23/21  1449     History Chief Complaint  Patient presents with   Abdominal Pain    Christina Fox is a 40 y.o. female.  HPI  Patient with medical history including anxiety, GERD, hypertension presents with complaints of nausea vomiting epigastric pain.  Patient states this started on Friday, started off with nausea and vomiting, and then developed into epigastric pain.  Pain does not radiate, it will come and go, denies alleviating or aggravating factors.  She states she can tolerate p.o. but becomes very nauseous, she also notes that she is having some diarrhea she denies hematochezia, melena, hematemesis, coffee-ground emesis, denies  urinary symptoms.  She has no significant abdominal history, had a colonoscopy formed last year which was unremarkable, no history of PUD, pancreatitis, bowel obstructions, denies increased alcohol use or NSAID use.  She has no associate fevers, chills, nasal ingestion, sore throat, cough, general body aches.  She had a video visit where they started on Zofran took this without much relief.  Has no other complaints.  Past Medical History:  Diagnosis Date   Abdominal pain    "right lower quadrant pain and right lower back pain   Anxiety    Deviated septum 09/2011   GERD (gastroesophageal reflux disease)    Headache(784.0)    sinus   History of echocardiogram    Echo 11/17: EF 55-60, no RWMA, normal diastolic function   Hypertension    no meds   Kidney stones    no current problem   Migraine    Nasal turbinate hypertrophy 09/2011   bilat.   Pelvic inflammatory disease    Urticaria     Patient Active Problem List   Diagnosis Date Noted   Upper airway cough syndrome 07/08/2020   Gallstone 01/07/2016   History of colonic polyps 11/27/2015   Right lower quadrant abdominal pain 11/27/2015   HTN (hypertension), benign 11/24/2015   Abdominal pain  11/16/2015   Headache 10/13/2015   Thyromegaly 11/03/2014   Anxiety disorder 11/03/2014    Past Surgical History:  Procedure Laterality Date   CHOLECYSTECTOMY N/A 01/15/2016   Procedure: LAPAROSCOPIC CHOLECYSTECTOMY WITH INTRAOPERATIVE CHOLANGIOGRAM;  Surgeon: Christene Lye, MD;  Location: [facility] ORS;  Service: General;  Laterality: N/A;   COLONOSCOPY  2007   [facility]: pedunculated 55m polyp in distal sigmoid, sessile 236mpolyp at splenic flexure, larger polyp tubulovillous adenoma and smaller polyp tubular adenoma    COLONOSCOPY  09-09-15   COLONOSCOPY WITH PROPOFOL N/A 03/01/2016   Procedure: COLONOSCOPY WITH PROPOFOL;  Surgeon: MaGarlan FairMD;  Location: [facility];  Service: Endoscopy;  Laterality: N/A;   NASAL SEPTOPLASTY W/ TURBINOPLASTY  10/04/2011   Procedure: NASAL SEPTOPLASTY WITH TURBINATE REDUCTION;  Surgeon: SuAscencion DikeMD;  Location: [facility] Service: ENT;  Laterality: Bilateral;   WISDOM TOOTH EXTRACTION  2009     OB History     Gravida  5   Para  4   Term  4   Preterm      AB  1   Living  4      SAB  1   IAB      Ectopic      Multiple      Live Births  4        Obstetric Comments  1st Menstrual Cycle:  10  1st Pregnancy:  16  Family History  Problem Relation Age of Onset   Kidney cancer Mother    Hypertension Mother    Migraines Mother    Multiple sclerosis Mother    Fibromyalgia Mother    Liver disease Mother        NASH    Hypertension Father    Liver disease Father        liver transplant; had fatty liver disease   Spina bifida Brother    Migraines Maternal Grandmother    Hypertension Other    Colon cancer Paternal Grandfather    Allergic rhinitis Daughter    Asthma Daughter    Allergic rhinitis Son    Asthma Son    Angioedema Neg Hx    Eczema Neg Hx    Immunodeficiency Neg Hx    Urticaria Neg Hx     Social History   Tobacco Use   Smoking status: Every Day    Packs/day: 0.25     Years: 10.00    Pack years: 2.50    Types: Cigarettes   Smokeless tobacco: Never   Tobacco comments:    2-3 cigarettes a day  Vaping Use   Vaping Use: Never used  Substance Use Topics   Alcohol use: Yes    Alcohol/week: 0.0 standard drinks    Comment: ocassionally   Drug use: No    Home Medications Prior to Admission medications   Medication Sig Start Date End Date Taking? Authorizing Provider  promethazine (PHENERGAN) 25 MG suppository Place 1 suppository (25 mg total) rectally every 8 (eight) hours as needed for nausea or vomiting. 05/23/21  Yes Marcello Fennel, PA-C  acetaminophen (TYLENOL) 500 MG tablet Take 500-1,000 mg by mouth every 6 (six) hours as needed for mild pain.     [provider]  albuterol (VENTOLIN HFA) 108 (90 Base) MCG/ACT inhaler Inhale 2 puffs into the lungs every 6 (six) hours as needed for wheezing or shortness of breath.    [provider]  amoxicillin-clavulanate (AUGMENTIN) 875-125 MG tablet Take 1 tablet by mouth 2 (two) times daily. 01/01/21   Mar Daring, PA-C  clonazePAM (KLONOPIN) 0.5 MG tablet Take 0.5 mg by mouth 2 (two) times daily as needed for anxiety.     [provider]  fluticasone (FLONASE) 50 MCG/ACT nasal spray Place 2 sprays into both nostrils daily. 02/08/21   Perlie Mayo, NP  ibuprofen (ADVIL) 800 MG tablet Take 1 tablet (800 mg total) by mouth every 8 (eight) hours as needed. 07/23/20   Shelly Bombard, MD  lidocaine (XYLOCAINE) 2 % solution Apply small amount to areas of concern with a qtip. You can apply up to QID. 01/05/21   Brunetta Jeans, PA-C  Magnesium 500 MG TABS Take 1 tablet by mouth daily. 06/25/20   [provider]  montelukast (SINGULAIR) 10 MG tablet Take 10 mg by mouth at bedtime.    [provider]  mupirocin ointment (BACTROBAN) 2 % Apply 1 application topically 2 (two) times daily. For 5-7 days. 11/09/20   Brunetta Jeans, PA-C  omeprazole (PRILOSEC) 40 MG  capsule Take 40 mg by mouth daily.     [provider]  ondansetron (ZOFRAN) 4 MG tablet Take 1 tablet (4 mg total) by mouth every 8 (eight) hours as needed for nausea or vomiting. 05/23/21   Wurst, [place], PA-C  Potassium Chloride Crys ER (KLOR-CON M20 PO) Take 1 tablet by mouth daily. 06/25/20   [provider]  Vitamin D, Ergocalciferol, (DRISDOL)  1.25 MG (50000 UNIT) CAPS capsule Take 50,000 Units by mouth See admin instructions. Twice weekly    [provider]  escitalopram (LEXAPRO) 10 MG tablet Take 10 mg by mouth daily.    09/02/11  [provider]    Allergies    Clarithromycin, Doxycycline, Dicyclomine, Sulfasalazine, Bentyl [dicyclomine hcl], Haldol [haloperidol], Meclizine, Morphine and related, Toradol [ketorolac tromethamine], Macrobid [nitrofurantoin monohyd macro], Meclizine hcl, Nitrofurantoin, and Sulfa antibiotics  Review of Systems   Review of Systems  Constitutional:  Negative for chills and fever.  HENT:  Negative for congestion.   Respiratory:  Negative for shortness of breath.   Cardiovascular:  Negative for chest pain.  Gastrointestinal:  Positive for abdominal pain, diarrhea, nausea and vomiting.  Genitourinary:  Negative for enuresis.  Musculoskeletal:  Negative for back pain.  Skin:  Negative for rash.  Neurological:  Positive for weakness. Negative for dizziness and headaches.  Hematological:  Does not bruise/bleed easily.   Physical Exam Updated Vital Signs BP (!) 145/82 (BP Location: Right Arm)   Pulse 66   Temp 98.3 F (36.8 C)   Resp 17   SpO2 99%   Physical Exam Vitals and nursing note reviewed.  Constitutional:      General: She is not in acute distress.    Appearance: She is not ill-appearing.  HENT:     Head: Normocephalic and atraumatic.     Nose: No congestion.     Mouth/Throat:     Mouth: Mucous membranes are moist.     Pharynx: Oropharynx is clear.  Eyes:     Conjunctiva/sclera: Conjunctivae  normal.  Cardiovascular:     Rate and Rhythm: Normal rate and regular rhythm.     Pulses: Normal pulses.     Heart sounds: No murmur heard.   No friction rub. No gallop.  Pulmonary:     Effort: No respiratory distress.     Breath sounds: No wheezing, rhonchi or rales.  Abdominal:     Palpations: Abdomen is soft.     Tenderness: There is abdominal tenderness. There is no right CVA tenderness or left CVA tenderness.     Comments: Abdomen nondistended normal bowel sounds, dull to percussion, noted epigastric tenderness, no guarding, rebound times, peritoneal sign, negative Murphy sign McBurney point, no CVA tenderness.  Musculoskeletal:     Right lower leg: No edema.     Left lower leg: No edema.  Skin:    General: Skin is warm and dry.  Neurological:     Mental Status: She is alert.  Psychiatric:        Mood and Affect: Mood normal.    ED Results / Procedures / Treatments   Labs (all labs ordered are listed, but only abnormal results are displayed) Labs Reviewed  COMPREHENSIVE METABOLIC PANEL - Abnormal; Notable for the following components:      Result Value   Potassium 3.3 (*)    All other components within normal limits  CBC - Abnormal; Notable for the following components:   WBC 11.0 (*)    All other components within normal limits  RESP PANEL BY RT-PCR (FLU A&B, COVID) ARPGX2  LIPASE, BLOOD  URINALYSIS, ROUTINE W REFLEX MICROSCOPIC  PREGNANCY, URINE    EKG None  Radiology No results found.  Procedures Procedures   Medications Ordered in ED Medications  promethazine (PHENERGAN) 25 mg in sodium chloride 0.9 % 50 mL IVPB (0 mg Intravenous Stopped 05/23/21 1751)  promethazine (PHENERGAN) 25 MG/ML injection (  Canceled Entry 05/23/21  1719)  sodium chloride 0.9 % bolus 1,000 mL (0 mLs Intravenous Stopped 05/23/21 1812)  alum & mag hydroxide-simeth (MAALOX/MYLANTA) 200-200-20 MG/5ML suspension 30 mL (30 mLs Oral Given 05/23/21 1810)    And  lidocaine (XYLOCAINE) 2  % viscous mouth solution 15 mL (15 mLs Oral Given 05/23/21 1807)    ED Course  I have reviewed the triage vital signs and the nursing notes.  Pertinent labs & imaging results that were available during my care of the patient were reviewed by me and considered in my medical decision making (see chart for details).    MDM Rules/Calculators/A&P                          Initial impression-presents with nausea vomiting epigastric pain alert no acute distress vital signs noted for tachycardia.  Unclear etiology, probable gastroenteritis, will obtain basic lab work-up for IV fluids, antiemetics and reassess.  Work-up-CBC shows slight leukocytosis of 11, CMP shows potassium 3.3, UA unremarkable, urine pregnancy negative, lipase 34, respiratory panel negative.  Reassessment-patient is reassessed after fluids, antiemetics states he is feeling much better, states she still slight pain epigastric region will provide GI cocktail and reassess.  Reassessed after GI cocktail states she is feeling much better, vital signs remained stable, she is tolerant p.o., she is ready for discharge.  Rule out-low suspicion for lower lobe pneumonia as lung sounds are clear bilaterally, will defer imaging at this time.  I have low suspicion for liver or gallbladder abnormality as she has no right upper quadrant tenderness, liver enzymes, alk phos, T bili all within normal limits.  Low suspicion for pancreatitis as lipase is within normal limits.  Low suspicion for ruptured stomach ulcer as she has no peritoneal sign present on exam.  Low suspicion for bowel obstruction as abdomen is nondistended normal bowel sounds, so passing gas and having normal bowel movements.  Low suspicion for complicated diverticulitis as she is nontoxic-appearing, vital signs reassuring no leukocytosis present.  Low suspicion for appendicitis as she has no right lower quadrant tenderness, vital signs reassuring.  I have low suspicion for  intra-abdominal infection as she has low risk factors, vital signs reassuring, no leukocytosis, will defer imaging at this time.  Plan-  Nausea vomiting stomach pain since resolved-likely viral gastroenteritis, will provide her with promethazine, Bentyl, bowel rest and follow-up PCP for further evaluation.  Gave strict return precautions.  Vital signs have remained stable, no indication for hospital admission.  Patient given at home care as well strict return precautions.  Patient verbalized that they understood agreed to said plan.  Final Clinical Impression(s) / ED Diagnoses Final diagnoses:  Nausea vomiting and diarrhea    Rx / DC Orders ED Discharge Orders          Ordered    promethazine (PHENERGAN) 25 MG suppository  Every 8 hours PRN        05/23/21 1936             Aron Baba 05/23/21 Gillis Ends, MD 05/27/21 1334

## 2021-05-23 NOTE — Progress Notes (Signed)
I have spent 5 minutes in review of e-visit questionnaire, review and updating patient chart, medical decision making and response to patient.   Fatumata Kashani, PA-C    

## 2021-05-23 NOTE — Discharge Instructions (Addendum)
Lab work was reassuring, suspect to have a viral gastroenteritis.  Recommended bowel rest i.e. liquid diet for today as tolerated May moving to a soft diet and then advancing to a normal diet.  I have given you prescription for Phenergan please use as needed for nausea and/or Zofran that was prescribed to you.  You may also take Pepcid as this will help with stomach pain.  Follow-up with your PCP for further evaluation.  Come back to the emergency department if you develop chest pain, shortness of breath, severe abdominal pain, uncontrolled nausea, vomiting, diarrhea.

## 2021-05-23 NOTE — Progress Notes (Signed)
We are sorry that you are not feeling well.  Here is how we plan to help!  Based on what you have shared with me it looks like you have Acute Infectious Diarrhea.  Most cases of acute diarrhea are due to infections with virus and bacteria and are self-limited conditions lasting less than 14 days.  For your symptoms you may take Imodium 2 mg tablets that are over the counter at your local pharmacy. Take two tablet now and then one after each loose stool up to 6 a day.  Antibiotics are not needed for most people with diarrhea.  Optional: Zofran 4 mg 1 tablet every 8 hours as needed for nausea and vomiting  Infectious causes of diarrhea may be associated with abdominal cramping, but not abdominal pain.  If you are experienced abdominal pain, you should seek in person evaluation immediately to rule out other causes of symptoms such as gastritis, ulcer, gall bladder disease, pancreatitis, bowel obstruction, etc... If you are not experiencing pain you may use OTC imodium as well as prescribed zofran for nausea and vomiting.  I hope you feel better soon!   HOME CARE We recommend changing your diet to help with your symptoms for the next few days. Drink plenty of fluids that contain water salt and sugar. Sports drinks such as Gatorade may help.  You may try broths, soups, bananas, applesauce, soft breads, mashed potatoes or crackers.  You are considered infectious for as long as the diarrhea continues. Hand washing or use of alcohol based hand sanitizers is recommend. It is best to stay out of work or school until your symptoms stop.   GET HELP RIGHT AWAY If you have dark yellow colored urine or do not pass urine frequently you should drink more fluids.   If your symptoms worsen  If you feel like you are going to pass out (faint) You have a new problem  MAKE SURE YOU  Understand these instructions. Will watch your condition. Will get help right away if you are not doing well or get  worse.  Thank you for choosing an e-visit.  Your e-visit answers were reviewed by a board certified advanced clinical practitioner to complete your personal care plan. Depending upon the condition, your plan could have included both over the counter or prescription medications.  Please review your pharmacy choice. Make sure the pharmacy is open so you can pick up prescription now. If there is a problem, you may contact your provider through [employer] and have the prescription routed to another pharmacy.  Your safety is important to [place]. If you have drug allergies check your prescription carefully.   For the next 24 hours you can use MyChart to ask questions about today's visit, request a non-urgent call back, or ask for a work or school excuse. You will get an email in the next two days asking about your experience. I hope that your e-visit has been valuable and will speed your recovery.

## 2021-05-24 ENCOUNTER — Telehealth: Payer: Medicaid Other | Admitting: Emergency Medicine

## 2021-05-24 DIAGNOSIS — R197 Diarrhea, unspecified: Secondary | ICD-10-CM | POA: Diagnosis not present

## 2021-05-24 NOTE — Progress Notes (Signed)
Virtual Visit Consent   [name], you are scheduled for a virtual visit with a [facility] provider today.     Just as with appointments in the office, your consent must be obtained to participate.  Your consent will be active for this visit and any virtual visit you may have with one of our providers in the next 365 days.     If you have a MyChart account, a copy of this consent can be sent to you electronically.  All virtual visits are billed to your insurance company just like a traditional visit in the office.    As this is a virtual visit, video technology does not allow for your provider to perform a traditional examination.  This may limit your provider's ability to fully assess your condition.  If your provider identifies any concerns that need to be evaluated in person or the need to arrange testing (such as labs, EKG, etc.), we will make arrangements to do so.     Although advances in technology are sophisticated, we cannot ensure that it will always work on either your end or our end.  If the connection with a video visit is poor, the visit may have to be switched to a telephone visit.  With either a video or telephone visit, we are not always able to ensure that we have a secure connection.     I need to obtain your verbal consent now.   Are you willing to proceed with your visit today?    Areta A Krzywicki has provided verbal consent on 05/24/2021 for a virtual visit video.   Montine Circle, PA-C   Date: 05/24/2021 11:19 AM   Virtual Visit via Video Note   I, Montine Circle, connected with  AAHANA ELZA  ([id number], 06-19-1981) on 05/24/21 at 11:15 AM EST by a video-enabled telemedicine application and verified that I am speaking with the correct person using two identifiers.  Location: Patient: Virtual Visit Location Patient: Mobile Provider: Virtual Visit Location Provider: Home Office   I discussed the limitations of evaluation and management by  telemedicine and the availability of in person appointments. The patient expressed understanding and agreed to proceed.    History of Present Illness: Christina Fox is a 40 y.o. who identifies as a female who was assigned female at birth, and is being seen today for n/v/d.  States she was seen in the ED.  Had reassuring labs.  Denies abdominal tenderness.  Denies blood in stool or vomiting.  No longer vomiting, but still having diarrhea.  HPI: HPI  Problems:  Patient Active Problem List   Diagnosis Date Noted   Upper airway cough syndrome 07/08/2020   Gallstone 01/07/2016   History of colonic polyps 11/27/2015   Right lower quadrant abdominal pain 11/27/2015   HTN (hypertension), benign 11/24/2015   Abdominal pain 11/16/2015   Headache 10/13/2015   Thyromegaly 11/03/2014   Anxiety disorder 11/03/2014    Allergies:  Allergies  Allergen Reactions   Clarithromycin Hives and Itching   Doxycycline Shortness Of Breath    Chest pain   Dicyclomine Other (See Comments) and Rash    agitation    Sulfasalazine Rash and Hives   Bentyl [Dicyclomine Hcl] Other (See Comments)    DIZZINESS   Haldol [Haloperidol] Other (See Comments)    Made her feel very jittery and agitated   Meclizine     Chest pain   Morphine And Related Other (See Comments)    Made her  feel like she was losing her mind   Toradol [Ketorolac Tromethamine] Other (See Comments)    Made her feel very jittery and agitated   Macrobid [Nitrofurantoin Monohyd Macro] Rash   Meclizine Hcl Rash   Nitrofurantoin Rash   Sulfa Antibiotics Hives and Rash   Medications:  Current Outpatient Medications:    acetaminophen (TYLENOL) 500 MG tablet, Take 500-1,000 mg by mouth every 6 (six) hours as needed for mild pain. , Disp: , Rfl:    albuterol (VENTOLIN HFA) 108 (90 Base) MCG/ACT inhaler, Inhale 2 puffs into the lungs every 6 (six) hours as needed for wheezing or shortness of breath., Disp: , Rfl:    amoxicillin-clavulanate  (AUGMENTIN) 875-125 MG tablet, Take 1 tablet by mouth 2 (two) times daily., Disp: 14 tablet, Rfl: 0   clonazePAM (KLONOPIN) 0.5 MG tablet, Take 0.5 mg by mouth 2 (two) times daily as needed for anxiety. , Disp: , Rfl:    fluticasone (FLONASE) 50 MCG/ACT nasal spray, Place 2 sprays into both nostrils daily., Disp: 16 g, Rfl: 6   ibuprofen (ADVIL) 800 MG tablet, Take 1 tablet (800 mg total) by mouth every 8 (eight) hours as needed., Disp: 30 tablet, Rfl: 5   lidocaine (XYLOCAINE) 2 % solution, Apply small amount to areas of concern with a qtip. You can apply up to QID., Disp: 15 mL, Rfl: 0   Magnesium 500 MG TABS, Take 1 tablet by mouth daily., Disp: , Rfl:    montelukast (SINGULAIR) 10 MG tablet, Take 10 mg by mouth at bedtime., Disp: , Rfl:    mupirocin ointment (BACTROBAN) 2 %, Apply 1 application topically 2 (two) times daily. For 5-7 days., Disp: 22 g, Rfl: 0   omeprazole (PRILOSEC) 40 MG capsule, Take 40 mg by mouth daily. , Disp: , Rfl:    ondansetron (ZOFRAN) 4 MG tablet, Take 1 tablet (4 mg total) by mouth every 8 (eight) hours as needed for nausea or vomiting., Disp: 20 tablet, Rfl: 0   Potassium Chloride Crys ER (KLOR-CON M20 PO), Take 1 tablet by mouth daily., Disp: , Rfl:    promethazine (PHENERGAN) 25 MG suppository, Place 1 suppository (25 mg total) rectally every 8 (eight) hours as needed for nausea or vomiting., Disp: 12 each, Rfl: 0   Vitamin D, Ergocalciferol, (DRISDOL) 1.25 MG (50000 UNIT) CAPS capsule, Take 50,000 Units by mouth See admin instructions. Twice weekly, Disp: , Rfl:   Observations/Objective: Patient is well-developed, well-nourished in no acute distress.  Resting comfortably  at home.  Head is normocephalic, atraumatic.  No labored breathing.  Speech is clear and coherent with logical content.  Patient is alert and oriented at baseline.    Assessment and Plan: 1. Diarrhea, unspecified type Continue treatment as prescribed in ED yesterday.  Add imodium for  diarrhea.  Follow Up Instructions: I discussed the assessment and treatment plan with the patient. The patient was provided an opportunity to ask questions and all were answered. The patient agreed with the plan and demonstrated an understanding of the instructions.  A copy of instructions were sent to the patient via MyChart unless otherwise noted below.     The patient was advised to call back or seek an in-person evaluation if the symptoms worsen or if the condition fails to improve as anticipated.  Time:  I spent 9 minutes with the patient via telehealth technology discussing the above problems/concerns.    Montine Circle, PA-C

## 2021-05-24 NOTE — Patient Instructions (Signed)
Try over-the-counter imodium to help with diarrhea.  Stay hydrated.  Return to the ER for high fever or severe abdominal pain.

## 2021-06-09 ENCOUNTER — Telehealth: Payer: Medicaid Other | Admitting: Physician Assistant

## 2021-06-09 DIAGNOSIS — L739 Follicular disorder, unspecified: Secondary | ICD-10-CM

## 2021-06-10 MED ORDER — MUPIROCIN 2 % EX OINT: 1.0000 "application " | TOPICAL_OINTMENT | Freq: Two times a day (BID) | CUTANEOUS | 0 refills | Status: DC

## 2021-06-10 NOTE — Progress Notes (Signed)
Updated note after new information given:  Seems more consistent with mild folliculitis. Patient already started on Cefdinir for another infection by her PCP. Will add on topical Bactroban. Strict in-person evaluation precautions reviewed.

## 2021-06-10 NOTE — Addendum Note (Signed)
Addended by: Brunetta Jeans on: 06/10/2021 07:58 AM   Modules accepted: Orders

## 2021-06-10 NOTE — Progress Notes (Signed)
Based on what you shared with me, I feel your condition warrants further evaluation and I recommend that you be seen in a face to face visit.  Giving location of the possible boil, you should be seen in person as this may need to be drained and antibiotic shot may be warranted in addition to oral antibiotic.    NOTE: There will be NO CHARGE for this eVisit   If you are having a true medical emergency please call 911.      For an urgent face to face visit, [facility] has six urgent care centers for your convenience:     [facility] [place] Get Driving Directions [phone] [place], [place] [address]    [facility]) Get Driving Directions [phone] [place], [place] [address]  [facility]) Get Driving Directions [phone] [address] [place],  [place]  [address]  [facility] Get Driving Directions [phone] [address] [place], [place], [place] [address]   [facility] Get Driving Directions  [phone] [address] [place], [place] [address]   [facility] [place] Get Driving Directions [phone] [address], [place], [place] [address]  Your MyChart E-visit questionnaire answers were reviewed by a board certified advanced clinical practitioner to complete your personal care plan based on your specific symptoms.  Thank you for using e-Visits.

## 2021-06-10 NOTE — Progress Notes (Signed)
I have spent 5 minutes in review of e-visit questionnaire, review and updating patient chart, medical decision making and response to patient.   Honore Wipperfurth Cody Yareth Kearse, PA-C    

## 2021-07-23 ENCOUNTER — Emergency Department (HOSPITAL_BASED_OUTPATIENT_CLINIC_OR_DEPARTMENT_OTHER): Payer: Medicaid Other

## 2021-07-23 ENCOUNTER — Other Ambulatory Visit: Payer: Self-pay

## 2021-07-23 ENCOUNTER — Encounter (HOSPITAL_BASED_OUTPATIENT_CLINIC_OR_DEPARTMENT_OTHER): Payer: Self-pay

## 2021-07-23 ENCOUNTER — Emergency Department (HOSPITAL_BASED_OUTPATIENT_CLINIC_OR_DEPARTMENT_OTHER)
Admission: EM | Admit: 2021-07-23 | Discharge: 2021-07-23 | Disposition: A | Discharge status: Home / Self Care | Payer: Medicaid Other | Attending: Emergency Medicine | Admitting: Emergency Medicine

## 2021-07-23 DIAGNOSIS — R519 Headache, unspecified: Secondary | ICD-10-CM

## 2021-07-23 DIAGNOSIS — E876 Hypokalemia: Secondary | ICD-10-CM | POA: Insufficient documentation

## 2021-07-23 DIAGNOSIS — R42 Dizziness and giddiness: Secondary | ICD-10-CM | POA: Diagnosis not present

## 2021-07-23 DIAGNOSIS — I10 Essential (primary) hypertension: Secondary | ICD-10-CM | POA: Diagnosis not present

## 2021-07-23 DIAGNOSIS — M542 Cervicalgia: Secondary | ICD-10-CM | POA: Insufficient documentation

## 2021-07-23 DIAGNOSIS — R197 Diarrhea, unspecified: Secondary | ICD-10-CM | POA: Diagnosis not present

## 2021-07-23 LAB — CBC
HCT: 39.9 % (ref 36.0–46.0)
Hemoglobin: 13.1 g/dL (ref 12.0–15.0)
MCH: 28.9 pg (ref 26.0–34.0)
MCHC: 32.8 g/dL (ref 30.0–36.0)
MCV: 88.1 fL (ref 80.0–100.0)
Platelets: 304 10*3/uL (ref 150–400)
RBC: 4.53 MIL/uL (ref 3.87–5.11)
RDW: 12.7 % (ref 11.5–15.5)
WBC: 7.8 10*3/uL (ref 4.0–10.5)
nRBC: 0 % (ref 0.0–0.2)

## 2021-07-23 LAB — URINALYSIS, ROUTINE W REFLEX MICROSCOPIC
Bilirubin Urine: NEGATIVE
Glucose, UA: NEGATIVE mg/dL
Hgb urine dipstick: NEGATIVE
Ketones, ur: NEGATIVE mg/dL
Leukocytes,Ua: NEGATIVE
Nitrite: NEGATIVE
Protein, ur: NEGATIVE mg/dL
Specific Gravity, Urine: 1.005 (ref 1.005–1.030)
pH: 6.5 (ref 5.0–8.0)

## 2021-07-23 LAB — BASIC METABOLIC PANEL
Anion gap: 10 (ref 5–15)
BUN: 13 mg/dL (ref 6–20)
CO2: 25 mmol/L (ref 22–32)
Calcium: 9.1 mg/dL (ref 8.9–10.3)
Chloride: 102 mmol/L (ref 98–111)
Creatinine, Ser: 0.72 mg/dL (ref 0.44–1.00)
GFR, Estimated: >60 mL/min (ref >60)
Glucose, Bld: 91 mg/dL (ref 70–99)
Potassium: 2.9 mmol/L — ABNORMAL LOW (ref 3.5–5.1)
Sodium: 137 mmol/L (ref 135–145)

## 2021-07-23 LAB — MAGNESIUM: Magnesium: 2.1 mg/dL (ref 1.7–2.4)

## 2021-07-23 LAB — PREGNANCY, URINE: Preg Test, Ur: NEGATIVE

## 2021-07-23 MED ORDER — POTASSIUM CHLORIDE 10 MEQ/100ML IV SOLN
10.0000 meq | Freq: Once | INTRAVENOUS | Status: AC
  Administered 2021-07-23: 10 meq via INTRAVENOUS
  Filled 2021-07-23: qty 100

## 2021-07-23 MED ORDER — POTASSIUM CHLORIDE CRYS ER 20 MEQ PO TBCR
40.0000 meq | EXTENDED_RELEASE_TABLET | Freq: Once | ORAL | Status: AC
  Administered 2021-07-23: 40 meq via ORAL
  Filled 2021-07-23: qty 2

## 2021-07-23 MED ORDER — AMLODIPINE BESYLATE 5 MG PO TABS
5.0000 mg | ORAL_TABLET | Freq: Once | ORAL | Status: AC
  Administered 2021-07-23: 5 mg via ORAL
  Filled 2021-07-23: qty 1

## 2021-07-23 MED ORDER — IOHEXOL 350 MG/ML SOLN
100.0000 mL | Freq: Once | INTRAVENOUS | Status: AC | PRN
  Administered 2021-07-23: 75 mL via INTRAVENOUS

## 2021-07-23 MED ORDER — POTASSIUM CHLORIDE CRYS ER 20 MEQ PO TBCR
20.0000 meq | EXTENDED_RELEASE_TABLET | Freq: Every day | ORAL | 0 refills | Status: DC
Start: 2021-07-23 — End: 2021-10-06

## 2021-07-23 MED ORDER — AMLODIPINE BESYLATE 5 MG PO TABS: 5.0000 mg | ORAL_TABLET | Freq: Every day | ORAL | 0 refills | Status: DC

## 2021-07-23 NOTE — ED Triage Notes (Signed)
Pt sent here for further evaluation of headache, dizziness, blurred vision and high BP x4 days. Pt denies any hx of HTN. Pt report her symptoms started Tuesday while at work, seen her gastroenterologist yesterday and then her PCP today. No relief w/OTC meds. Pt reports pain is to the front of her head and posterior neck, denies any cold/congestion symptoms

## 2021-07-23 NOTE — Discharge Instructions (Addendum)
You came to the emergency department today to be evaluated for your high blood pressure, headache, and blurred vision.  The CT scan of your head and neck showed no acute abnormalities.  Your lab work showed that your potassium was low.  Due to this you received potassium in the emergency department.  I have given you a refill of your potassium medication.  Please take 20 mg twice a day for the next 5 days.  Please follow-up with your primary care provider for repeat evaluation of your potassium next week.  Your blood pressure was found to be elevated while in the emergency department.  You were started on the medication Norvasc.  Please take 1 pill daily for further control of your blood pressure.  Please follow-up closely with your primary care doctor for repeat evaluation of your blood pressure.  Please record your blood pressure at home daily and discontinue medication if any hypotension is observed.

## 2021-07-23 NOTE — ED Provider Notes (Signed)
[facility] EMERGENCY DEPT Provider Note   CSN: [id number] Arrival date & time: 07/23/21  1800     History  Chief Complaint  Patient presents with   Headache   Dizziness   Hypertension    Pier A Speth is a 41 y.o. female With a past medical history of anxiety, and GERD.  Presents to the emergency department with complaint of headache, dizziness, blurred vision, neck pain, and hypertension.  Patient states that on Tuesday she had a sudden onset of severe head pain.  Since then she has been dealing with hypertension and headaches.  Pain is located to frontotemporal aspect of head.  Headache has been constant.  Patient denies any aggravating or alleviating factors.  Patient states that yesterday she developed dizziness and blurred vision associated with her headache.  Patient also reports that she has been having intermittent bilateral neck pain since they started on Tuesday.  Patient denies any associated facial asymmetry, numbness, weakness, dysarthria, vision loss, diplopia.  No recent falls or traumatic injuries.  Patient states that her blood pressure has been elevated over the last 2 to 3 days.  Patient states that this morning she checked her blood pressure and was found to be 162/114.  Patient reports that she was started on medication for high blood pressure when she was having pain associated to biliary colic, after cholecystectomy blood pressure medication was discontinued.  Patient denies any associated chest pain, shortness of breath.  Patient reports that she does have diarrhea on a daily basis.  This has been constant ever since her cholecystectomy.  Patient states that she is post to take oral potassium daily however has not been taking this medication.    Patient denies any alcohol use or illicit drug use.   Headache Associated symptoms: diarrhea, dizziness and fatigue   Associated symptoms: no abdominal pain, no back pain, no congestion, no fever, no  nausea, no neck pain, no numbness, no seizures, no sore throat, no vomiting and no weakness   Dizziness Associated symptoms: diarrhea and headaches   Associated symptoms: no blood in stool, no chest pain, no nausea, no palpitations, no shortness of breath, no vomiting and no weakness   Hypertension Associated symptoms include headaches. Pertinent negatives include no chest pain, no abdominal pain and no shortness of breath.      Home Medications Prior to Admission medications   Medication Sig Start Date End Date Taking? Authorizing Provider  acetaminophen (TYLENOL) 500 MG tablet Take 500-1,000 mg by mouth every 6 (six) hours as needed for mild pain.     [provider]  albuterol (VENTOLIN HFA) 108 (90 Base) MCG/ACT inhaler Inhale 2 puffs into the lungs every 6 (six) hours as needed for wheezing or shortness of breath.    [provider]  amoxicillin-clavulanate (AUGMENTIN) 875-125 MG tablet Take 1 tablet by mouth 2 (two) times daily. 01/01/21   Mar Daring, PA-C  clonazePAM (KLONOPIN) 0.5 MG tablet Take 0.5 mg by mouth 2 (two) times daily as needed for anxiety.     [provider]  fluticasone (FLONASE) 50 MCG/ACT nasal spray Place 2 sprays into both nostrils daily. 02/08/21   Perlie Mayo, NP  ibuprofen (ADVIL) 800 MG tablet Take 1 tablet (800 mg total) by mouth every 8 (eight) hours as needed. 07/23/20   Shelly Bombard, MD  lidocaine (XYLOCAINE) 2 % solution Apply small amount to areas of concern with a qtip. You can apply up to QID. 01/05/21   Raiford Noble  C, PA-C  Magnesium 500 MG TABS Take 1 tablet by mouth daily. 06/25/20   [provider]  montelukast (SINGULAIR) 10 MG tablet Take 10 mg by mouth at bedtime.    [provider]  mupirocin ointment (BACTROBAN) 2 % Apply 1 application topically 2 (two) times daily. For 5-7 days. 06/10/21   Brunetta Jeans, PA-C  omeprazole (PRILOSEC) 40 MG capsule Take 40 mg by mouth daily.      [provider]  ondansetron (ZOFRAN) 4 MG tablet Take 1 tablet (4 mg total) by mouth every 8 (eight) hours as needed for nausea or vomiting. 05/23/21   Wurst, [place], PA-C  Potassium Chloride Crys ER (KLOR-CON M20 PO) Take 1 tablet by mouth daily. 06/25/20   [provider]  promethazine (PHENERGAN) 25 MG suppository Place 1 suppository (25 mg total) rectally every 8 (eight) hours as needed for nausea or vomiting. 05/23/21   Marcello Fennel, PA-C  Vitamin D, Ergocalciferol, (DRISDOL) 1.25 MG (50000 UNIT) CAPS capsule Take 50,000 Units by mouth See admin instructions. Twice weekly    [provider]  escitalopram (LEXAPRO) 10 MG tablet Take 10 mg by mouth daily.    09/02/11  [provider]      Allergies    Clarithromycin, Doxycycline, Dicyclomine, Sulfasalazine, Bentyl [dicyclomine hcl], Haldol [haloperidol], Meclizine, Morphine and related, Toradol [ketorolac tromethamine], Macrobid [nitrofurantoin monohyd macro], Meclizine hcl, Nitrofurantoin, and Sulfa antibiotics    Review of Systems   Review of Systems  Constitutional:  Positive for fatigue. Negative for chills and fever.  HENT:  Negative for congestion, rhinorrhea and sore throat.   Eyes:  Positive for visual disturbance.  Respiratory:  Negative for shortness of breath.   Cardiovascular:  Negative for chest pain, palpitations and leg swelling.  Gastrointestinal:  Positive for diarrhea. Negative for abdominal distention, abdominal pain, anal bleeding, blood in stool, constipation, nausea, rectal pain and vomiting.  Genitourinary:  Negative for difficulty urinating, dysuria, flank pain, frequency, genital sores, hematuria, pelvic pain, vaginal bleeding, vaginal discharge and vaginal pain.  Musculoskeletal:  Negative for back pain and neck pain.  Skin:  Negative for color change and rash.  Neurological:  Positive for dizziness and headaches. Negative for tremors, seizures, syncope, facial asymmetry,  speech difficulty, weakness, light-headedness and numbness.  Psychiatric/Behavioral:  Negative for confusion.    Physical Exam Updated Vital Signs BP (!) 158/87 (BP Location: Right Arm)    Pulse 69    Temp 98.5 F (36.9 C)    Resp 20    SpO2 99%  Physical Exam Vitals and nursing note reviewed.  Constitutional:      General: She is not in acute distress.    Appearance: She is not ill-appearing, toxic-appearing or diaphoretic.  Eyes:     General: No visual field deficit or scleral icterus.       Right eye: No discharge.        Left eye: No discharge.     Extraocular Movements: Extraocular movements intact.     Conjunctiva/sclera: Conjunctivae normal.     Pupils: Pupils are equal, round, and reactive to light.  Cardiovascular:     Rate and Rhythm: Normal rate.     Pulses:          Radial pulses are 2+ on the right side and 2+ on the left side.  Pulmonary:     Effort: Pulmonary effort is normal. No tachypnea or bradypnea.     Breath sounds: Normal breath sounds. No stridor.  Abdominal:  Palpations: Abdomen is soft.     Tenderness: There is no abdominal tenderness.  Musculoskeletal:     Cervical back: Normal range of motion and neck supple. No edema, erythema, signs of trauma, rigidity, torticollis or crepitus. No pain with movement, spinous process tenderness or muscular tenderness. Normal range of motion.  Skin:    General: Skin is warm and dry.  Neurological:     General: No focal deficit present.     Mental Status: She is alert and oriented to person, place, and time.     GCS: GCS eye subscore is 4. GCS verbal subscore is 5. GCS motor subscore is 6.     Cranial Nerves: No cranial nerve deficit, dysarthria or facial asymmetry.     Sensory: Sensation is intact.     Motor: No weakness, tremor, seizure activity or pronator drift.     Coordination: Romberg sign negative. Finger-Nose-Finger Test normal.     Gait: Gait is intact. Gait normal.     Comments: CN II-XII intact, equal  grip strength, +5 strength to bilateral upper and lower extremities, sensation to light touch grossly intact to bilateral upper and lower extremities  Psychiatric:        Behavior: Behavior is cooperative.    ED Results / Procedures / Treatments   Labs (all labs ordered are listed, but only abnormal results are displayed) Labs Reviewed  BASIC METABOLIC PANEL - Abnormal; Notable for the following components:      Result Value   Potassium 2.9 (*)    All other components within normal limits  URINALYSIS, ROUTINE W REFLEX MICROSCOPIC - Abnormal; Notable for the following components:   Color, Urine COLORLESS (*)    All other components within normal limits  CBC  PREGNANCY, URINE  MAGNESIUM    EKG None  Radiology CT ANGIO HEAD NECK W WO CM  Result Date: 07/23/2021 CLINICAL DATA:  Sudden severe headache EXAM: CT ANGIOGRAPHY HEAD AND NECK TECHNIQUE: Multidetector CT imaging of the head and neck was performed using the standard protocol during bolus administration of intravenous contrast. Multiplanar CT image reconstructions and MIPs were obtained to evaluate the vascular anatomy. Carotid stenosis measurements (when applicable) are obtained utilizing NASCET criteria, using the distal internal carotid diameter as the denominator. RADIATION DOSE REDUCTION: This exam was performed according to the departmental dose-optimization program which includes automated exposure control, adjustment of the mA and/or kV according to patient size and/or use of iterative reconstruction technique. CONTRAST:  38mL OMNIPAQUE IOHEXOL 350 MG/ML SOLN COMPARISON:  None. FINDINGS: CTA NECK FINDINGS SKELETON: There is no bony spinal canal stenosis. No lytic or blastic lesion. OTHER NECK: Normal pharynx, larynx and major salivary glands. No cervical lymphadenopathy. Unremarkable thyroid gland. UPPER CHEST: No pneumothorax or pleural effusion. No nodules or masses. AORTIC ARCH: There is calcific atherosclerosis of the aortic  arch. There is no aneurysm, dissection or hemodynamically significant stenosis of the visualized portion of the aorta. Conventional 3 vessel aortic branching pattern. The visualized proximal subclavian arteries are widely patent. RIGHT CAROTID SYSTEM: Normal without aneurysm, dissection or stenosis. LEFT CAROTID SYSTEM: Normal without aneurysm, dissection or stenosis. VERTEBRAL ARTERIES: Right dominant configuration. Both origins are clearly patent. There is no dissection, occlusion or flow-limiting stenosis to the skull base (V1-V3 segments). CTA HEAD FINDINGS POSTERIOR CIRCULATION: --Vertebral arteries: Normal V4 segments. --Inferior cerebellar arteries: Normal. --Basilar artery: Normal. --Superior cerebellar arteries: Normal. --Posterior cerebral arteries (PCA): Normal. ANTERIOR CIRCULATION: --Intracranial internal carotid arteries: Normal. --Anterior cerebral arteries (ACA): Normal. Both A1 segments are  present. Patent anterior communicating artery (a-comm). --Middle cerebral arteries (MCA): Normal. VENOUS SINUSES: As permitted by contrast timing, patent. ANATOMIC VARIANTS: Fetal origin of the left posterior cerebral artery. Review of the MIP images confirms the above findings. IMPRESSION: 1. No aneurysm, dissection or hemodynamically significant stenosis of the major cervical or intracranial arteries. Aortic Atherosclerosis (ICD10-I70.0). Electronically Signed   By: Ulyses Jarred M.D.   On: 07/23/2021 21:13   CT HEAD WO CONTRAST  Result Date: 07/23/2021 CLINICAL DATA:  Headache, new or worsening, neuro deficit (Age 73-49y). Pt sent here for further evaluation of headache, dizziness, blurred vision and high BP x4 days. EXAM: CT HEAD WITHOUT CONTRAST TECHNIQUE: Contiguous axial images were obtained from the base of the skull through the vertex without intravenous contrast. RADIATION DOSE REDUCTION: This exam was performed according to the departmental dose-optimization program which includes automated  exposure control, adjustment of the mA and/or kV according to patient size and/or use of iterative reconstruction technique. COMPARISON:  None. FINDINGS: Brain: No evidence of large-territorial acute infarction. No parenchymal hemorrhage. No mass lesion. No extra-axial collection. No mass effect or midline shift. No hydrocephalus. Basilar cisterns are patent. Vascular: No hyperdense vessel. Skull: No acute fracture or focal lesion. Sinuses/Orbits: Paranasal sinuses and mastoid air cells are clear. The orbits are unremarkable. Other: None. IMPRESSION: No acute intracranial abnormality. Electronically Signed   By: Iven Finn M.D.   On: 07/23/2021 18:50    Procedures Procedures    Medications Ordered in ED Medications  potassium chloride SA (KLOR-CON M) CR tablet 40 mEq (40 mEq Oral Given 07/23/21 2158)  potassium chloride 10 mEq in 100 mL IVPB (0 mEq Intravenous Stopped 07/23/21 2315)  amLODipine (NORVASC) tablet 5 mg (5 mg Oral Given 07/23/21 2159)  iohexol (OMNIPAQUE) 350 MG/ML injection 100 mL (75 mLs Intravenous Contrast Given 07/23/21 2054)    ED Course/ Medical Decision Making/ A&P                           Medical Decision Making Amount and/or Complexity of Data Reviewed Labs: ordered. Radiology: ordered.  Risk Prescription drug management.   Alert 41 year old female no acute distress, nontoxic-appearing.  Presents to the emergency department with a chief complaint of hypertension, headache, neck pain, and dizziness.  Patient has medical history of anxiety which complicates her care.  Information was obtained from patient.  Medical record was reviewed including previous provider notes and lab results.  With patient's report of hypertension, neck pain, headache, and dizziness concern for possible dissection will obtain CTA of head and neck for further evaluation.  Lab work was ordered and independently reviewed myself.  Pertinent findings include: -Potassium 2.9 -Magnesium  within normal limits  EKG was ordered and amply reviewed myself.  Tracing shows sinus rhythm.  Patient reports daily diarrhea related to her cholecystectomy.  Patient is prescribed potassium however has been noncompliant with this medication.  Patient given potassium repletion with oral potassium and IV potassium.  Patient home potassium reordered.  Patient advised to take 40 mEq of potassium daily for the next 5 days and follow-up with primary care provider for repeat assessment.  Patient's neuro exam is reassuring with no focal neurological deficits noted.  CTA head and neck shows no acute abnormalities.  Patient has no headache at this time.  While patient follow-up with neurology in outpatient setting.  Patient reports elevated blood pressure at OB/GYN visit, home recordings, and in emergency department.  We will start patient on  5 mg Norvasc and have her follow-up with primary care provider for further management of blood pressure.  Patient advised to record blood pressure at home and discontinue medication if any hypotension is observed.  Patient care was discussed with attending physician Dr. Rogene Houston.  Discussed results, findings, treatment and follow up. Patient advised of return precautions. Patient verbalized understanding and agreed with plan.         Final Clinical Impression(s) / ED Diagnoses Final diagnoses:  Hypertension, unspecified type  Acute nonintractable headache, unspecified headache type    Rx / DC Orders ED Discharge Orders          Ordered    Ambulatory referral to Neurology       Comments: An appointment is requested in approximately: 1 week   07/23/21 2215    amLODipine (NORVASC) 5 MG tablet  Daily        07/23/21 2215    potassium chloride SA (KLOR-CON M20) 20 MEQ tablet  Daily        07/23/21 2216              Loni Beckwith, PA-C 07/24/21 0040    Fredia Sorrow, MD 07/25/21 1655

## 2021-07-24 ENCOUNTER — Telehealth: Payer: Medicaid Other | Admitting: Emergency Medicine

## 2021-07-24 ENCOUNTER — Encounter: Payer: Self-pay | Admitting: Emergency Medicine

## 2021-07-24 DIAGNOSIS — B9689 Other specified bacterial agents as the cause of diseases classified elsewhere: Secondary | ICD-10-CM | POA: Diagnosis not present

## 2021-07-24 DIAGNOSIS — J019 Acute sinusitis, unspecified: Secondary | ICD-10-CM | POA: Diagnosis not present

## 2021-07-24 MED ORDER — AMOXICILLIN-POT CLAVULANATE 875-125 MG PO TABS: 1.0000 | ORAL_TABLET | Freq: Two times a day (BID) | ORAL | 0 refills | Status: DC

## 2021-07-24 NOTE — Progress Notes (Signed)
I have spent 5 minutes in review of e-visit questionnaire, review and updating patient chart, medical decision making and response to patient.   Karol Liendo, PA-C    

## 2021-07-24 NOTE — Progress Notes (Signed)

## 2021-07-25 ENCOUNTER — Emergency Department (HOSPITAL_BASED_OUTPATIENT_CLINIC_OR_DEPARTMENT_OTHER): Payer: Medicaid Other

## 2021-07-25 ENCOUNTER — Other Ambulatory Visit: Payer: Self-pay

## 2021-07-25 ENCOUNTER — Emergency Department (HOSPITAL_BASED_OUTPATIENT_CLINIC_OR_DEPARTMENT_OTHER)
Admission: EM | Admit: 2021-07-25 | Discharge: 2021-07-25 | Disposition: A | Discharge status: Home / Self Care | Payer: Medicaid Other | Attending: Emergency Medicine | Admitting: Emergency Medicine

## 2021-07-25 DIAGNOSIS — N9489 Other specified conditions associated with female genital organs and menstrual cycle: Secondary | ICD-10-CM | POA: Insufficient documentation

## 2021-07-25 DIAGNOSIS — A084 Viral intestinal infection, unspecified: Secondary | ICD-10-CM | POA: Insufficient documentation

## 2021-07-25 DIAGNOSIS — R1013 Epigastric pain: Secondary | ICD-10-CM

## 2021-07-25 DIAGNOSIS — Z20822 Contact with and (suspected) exposure to covid-19: Secondary | ICD-10-CM | POA: Diagnosis not present

## 2021-07-25 DIAGNOSIS — Z79899 Other long term (current) drug therapy: Secondary | ICD-10-CM | POA: Diagnosis not present

## 2021-07-25 DIAGNOSIS — K760 Fatty (change of) liver, not elsewhere classified: Secondary | ICD-10-CM | POA: Diagnosis not present

## 2021-07-25 LAB — COMPREHENSIVE METABOLIC PANEL
ALT: 56 U/L — ABNORMAL HIGH (ref 0–44)
AST: 31 U/L (ref 15–41)
Albumin: 4.6 g/dL (ref 3.5–5.0)
Alkaline Phosphatase: 67 U/L (ref 38–126)
Anion gap: 11 (ref 5–15)
BUN: 10 mg/dL (ref 6–20)
CO2: 26 mmol/L (ref 22–32)
Calcium: 9.5 mg/dL (ref 8.9–10.3)
Chloride: 101 mmol/L (ref 98–111)
Creatinine, Ser: 0.67 mg/dL (ref 0.44–1.00)
GFR, Estimated: >60 mL/min (ref >60)
Glucose, Bld: 92 mg/dL (ref 70–99)
Potassium: 3.4 mmol/L — ABNORMAL LOW (ref 3.5–5.1)
Sodium: 138 mmol/L (ref 135–145)
Total Bilirubin: 0.9 mg/dL (ref 0.3–1.2)
Total Protein: 7.6 g/dL (ref 6.5–8.1)

## 2021-07-25 LAB — TROPONIN I (HIGH SENSITIVITY): Troponin I (High Sensitivity): 3 ng/L (ref <18)

## 2021-07-25 LAB — CBC
HCT: 43.4 % (ref 36.0–46.0)
Hemoglobin: 14.2 g/dL (ref 12.0–15.0)
MCH: 28.7 pg (ref 26.0–34.0)
MCHC: 32.7 g/dL (ref 30.0–36.0)
MCV: 87.7 fL (ref 80.0–100.0)
Platelets: 315 10*3/uL (ref 150–400)
RBC: 4.95 MIL/uL (ref 3.87–5.11)
RDW: 12.9 % (ref 11.5–15.5)
WBC: 9.9 10*3/uL (ref 4.0–10.5)
nRBC: 0 % (ref 0.0–0.2)

## 2021-07-25 LAB — RESP PANEL BY RT-PCR (FLU A&B, COVID) ARPGX2
Influenza A by PCR: NEGATIVE
Influenza B by PCR: NEGATIVE
SARS Coronavirus 2 by RT PCR: NEGATIVE

## 2021-07-25 LAB — HCG, SERUM, QUALITATIVE: Preg, Serum: NEGATIVE

## 2021-07-25 LAB — LIPASE, BLOOD: Lipase: 26 U/L (ref 11–51)

## 2021-07-25 MED ORDER — ACETAMINOPHEN 325 MG PO TABS
650.0000 mg | ORAL_TABLET | Freq: Four times a day (QID) | ORAL | Status: DC | PRN
  Administered 2021-07-25: 650 mg via ORAL
  Filled 2021-07-25: qty 2

## 2021-07-25 MED ORDER — IOHEXOL 300 MG/ML  SOLN
100.0000 mL | Freq: Once | INTRAMUSCULAR | Status: AC | PRN
  Administered 2021-07-25: 100 mL via INTRAVENOUS

## 2021-07-25 NOTE — ED Provider Notes (Signed)
[facility] DEPT Provider Note   CSN: [id number] Arrival date & time: 07/25/21  1303     History  Chief Complaint  Patient presents with   Abdominal Pain    Christina Fox is a 41 y.o. female with history of cholecystectomy, IBS and GERD.  Who presents to the ED for evaluation of sudden onset epigastric and left-sided abdominal pain that occurred 1 hour prior to arrival.  Patient states that pain feels worse than childbirth.  She notes that symptoms do not feel like GERD or IBS.  She describes pain as sharp with intermittent "squeezing".  No treatment prior to arrival.  No alleviating or aggravating factors.  Patient denies fever, chills, nausea, vomiting, diarrhea.  She denies chest pain, shortness of breath, urinary symptoms.  No recent travel, no known sick contacts, no unusual ingestion.   Abdominal Pain Associated symptoms: no fever, no shortness of breath and no vomiting       Home Medications Prior to Admission medications   Medication Sig Start Date End Date Taking? Authorizing Provider  acetaminophen (TYLENOL) 500 MG tablet Take 500-1,000 mg by mouth every 6 (six) hours as needed for mild pain.     [provider]  albuterol (VENTOLIN HFA) 108 (90 Base) MCG/ACT inhaler Inhale 2 puffs into the lungs every 6 (six) hours as needed for wheezing or shortness of breath.    [provider]  amLODipine (NORVASC) 5 MG tablet Take 1 tablet (5 mg total) by mouth daily. 07/23/21 08/22/21  Loni Beckwith, PA-C  amoxicillin-clavulanate (AUGMENTIN) 875-125 MG tablet Take 1 tablet by mouth 2 (two) times daily. 07/24/21   Wurst, [place], PA-C  clonazePAM (KLONOPIN) 0.5 MG tablet Take 0.5 mg by mouth 2 (two) times daily as needed for anxiety.     [provider]  fluticasone (FLONASE) 50 MCG/ACT nasal spray Place 2 sprays into both nostrils daily. 02/08/21   Perlie Mayo, NP  ibuprofen (ADVIL) 800 MG tablet Take 1 tablet (800 mg  total) by mouth every 8 (eight) hours as needed. 07/23/20   Shelly Bombard, MD  lidocaine (XYLOCAINE) 2 % solution Apply small amount to areas of concern with a qtip. You can apply up to QID. 01/05/21   Brunetta Jeans, PA-C  Magnesium 500 MG TABS Take 1 tablet by mouth daily. 06/25/20   [provider]  montelukast (SINGULAIR) 10 MG tablet Take 10 mg by mouth at bedtime.    [provider]  mupirocin ointment (BACTROBAN) 2 % Apply 1 application topically 2 (two) times daily. For 5-7 days. 06/10/21   Brunetta Jeans, PA-C  omeprazole (PRILOSEC) 40 MG capsule Take 40 mg by mouth daily.     [provider]  ondansetron (ZOFRAN) 4 MG tablet Take 1 tablet (4 mg total) by mouth every 8 (eight) hours as needed for nausea or vomiting. 05/23/21   Wurst, [place], PA-C  potassium chloride SA (KLOR-CON M20) 20 MEQ tablet Take 1 tablet (20 mEq total) by mouth daily. 07/23/21 08/22/21  Loni Beckwith, PA-C  promethazine (PHENERGAN) 25 MG suppository Place 1 suppository (25 mg total) rectally every 8 (eight) hours as needed for nausea or vomiting. 05/23/21   Marcello Fennel, PA-C  Vitamin D, Ergocalciferol, (DRISDOL) 1.25 MG (50000 UNIT) CAPS capsule Take 50,000 Units by mouth See admin instructions. Twice weekly    [provider]  escitalopram (LEXAPRO) 10 MG tablet Take 10 mg by mouth daily.    09/02/11  [provider]      Allergies    Clarithromycin, Doxycycline, Dicyclomine, Sulfasalazine, Bentyl [dicyclomine hcl], Haldol [haloperidol], Meclizine, Morphine and related, Toradol [ketorolac tromethamine], Macrobid [nitrofurantoin monohyd macro], Meclizine hcl, Nitrofurantoin, and Sulfa antibiotics    Review of Systems   Review of Systems  Constitutional:  Negative for fever.  HENT: Negative.    Eyes: Negative.   Respiratory:  Negative for shortness of breath.   Cardiovascular: Negative.   Gastrointestinal:  Positive for abdominal pain. Negative  for vomiting.  Endocrine: Negative.   Genitourinary: Negative.   Musculoskeletal: Negative.   Skin:  Negative for rash.  Neurological:  Negative for headaches.  All other systems reviewed and are negative.  Physical Exam Updated Vital Signs BP (!) 145/99 (BP Location: Right Arm)    Pulse 77    Temp 98.2 F (36.8 C) (Oral)    Resp 14    Ht 5\' 4"  (1.626 m)    Wt 88.9 kg    LMP 07/18/2021 (Approximate)    SpO2 98%    BMI 33.64 kg/m  Physical Exam Vitals and nursing note reviewed.  Constitutional:      General: She is not in acute distress.    Appearance: She is not ill-appearing.  HENT:     Head: Atraumatic.  Eyes:     Conjunctiva/sclera: Conjunctivae normal.  Cardiovascular:     Rate and Rhythm: Normal rate and regular rhythm.     Pulses: Normal pulses.     Heart sounds: No murmur heard. Pulmonary:     Effort: Pulmonary effort is normal. No respiratory distress.     Breath sounds: Normal breath sounds.  Abdominal:     General: There is no distension.     Palpations: Abdomen is soft.     Tenderness: There is no abdominal tenderness.     Comments: Abdomen is soft, rounded.  She has tenderness throughout the left upper quadrant and left lower quadrant and epigastric region.  Negative Murphy sign.  Negative McBurney's.  Negative CVA tenderness bilaterally.  Musculoskeletal:        General: Normal range of motion.     Cervical back: Normal range of motion.  Skin:    General: Skin is warm and dry.     Capillary Refill: Capillary refill takes less than 2 seconds.  Neurological:     General: No focal deficit present.     Mental Status: She is alert.  Psychiatric:        Mood and Affect: Mood normal.    ED Results / Procedures / Treatments   Labs (all labs ordered are listed, but only abnormal results are displayed) Labs Reviewed  COMPREHENSIVE METABOLIC PANEL - Abnormal; Notable for the following components:      Result Value   Potassium 3.4 (*)    ALT 56 (*)    All other  components within normal limits  RESP PANEL BY RT-PCR (FLU A&B, COVID) ARPGX2  CBC  LIPASE, BLOOD  HCG, SERUM, QUALITATIVE  TROPONIN I (HIGH SENSITIVITY)  TROPONIN I (HIGH SENSITIVITY)    EKG None  Radiology CT ANGIO HEAD NECK W WO CM  Result Date: 07/23/2021 CLINICAL DATA:  Sudden severe headache EXAM: CT ANGIOGRAPHY HEAD AND NECK TECHNIQUE: Multidetector CT imaging of the head and neck was performed using the standard protocol during bolus administration of intravenous contrast. Multiplanar CT image reconstructions and MIPs were obtained to evaluate the vascular anatomy. Carotid stenosis measurements (when applicable) are obtained utilizing NASCET criteria, using the distal internal carotid diameter  as the denominator. RADIATION DOSE REDUCTION: This exam was performed according to the departmental dose-optimization program which includes automated exposure control, adjustment of the mA and/or kV according to patient size and/or use of iterative reconstruction technique. CONTRAST:  2mL OMNIPAQUE IOHEXOL 350 MG/ML SOLN COMPARISON:  None. FINDINGS: CTA NECK FINDINGS SKELETON: There is no bony spinal canal stenosis. No lytic or blastic lesion. OTHER NECK: Normal pharynx, larynx and major salivary glands. No cervical lymphadenopathy. Unremarkable thyroid gland. UPPER CHEST: No pneumothorax or pleural effusion. No nodules or masses. AORTIC ARCH: There is calcific atherosclerosis of the aortic arch. There is no aneurysm, dissection or hemodynamically significant stenosis of the visualized portion of the aorta. Conventional 3 vessel aortic branching pattern. The visualized proximal subclavian arteries are widely patent. RIGHT CAROTID SYSTEM: Normal without aneurysm, dissection or stenosis. LEFT CAROTID SYSTEM: Normal without aneurysm, dissection or stenosis. VERTEBRAL ARTERIES: Right dominant configuration. Both origins are clearly patent. There is no dissection, occlusion or flow-limiting stenosis to the  skull base (V1-V3 segments). CTA HEAD FINDINGS POSTERIOR CIRCULATION: --Vertebral arteries: Normal V4 segments. --Inferior cerebellar arteries: Normal. --Basilar artery: Normal. --Superior cerebellar arteries: Normal. --Posterior cerebral arteries (PCA): Normal. ANTERIOR CIRCULATION: --Intracranial internal carotid arteries: Normal. --Anterior cerebral arteries (ACA): Normal. Both A1 segments are present. Patent anterior communicating artery (a-comm). --Middle cerebral arteries (MCA): Normal. VENOUS SINUSES: As permitted by contrast timing, patent. ANATOMIC VARIANTS: Fetal origin of the left posterior cerebral artery. Review of the MIP images confirms the above findings. IMPRESSION: 1. No aneurysm, dissection or hemodynamically significant stenosis of the major cervical or intracranial arteries. Aortic Atherosclerosis (ICD10-I70.0). Electronically Signed   By: Ulyses Jarred M.D.   On: 07/23/2021 21:13   CT HEAD WO CONTRAST  Result Date: 07/23/2021 CLINICAL DATA:  Headache, new or worsening, neuro deficit (Age 53-49y). Pt sent here for further evaluation of headache, dizziness, blurred vision and high BP x4 days. EXAM: CT HEAD WITHOUT CONTRAST TECHNIQUE: Contiguous axial images were obtained from the base of the skull through the vertex without intravenous contrast. RADIATION DOSE REDUCTION: This exam was performed according to the departmental dose-optimization program which includes automated exposure control, adjustment of the mA and/or kV according to patient size and/or use of iterative reconstruction technique. COMPARISON:  None. FINDINGS: Brain: No evidence of large-territorial acute infarction. No parenchymal hemorrhage. No mass lesion. No extra-axial collection. No mass effect or midline shift. No hydrocephalus. Basilar cisterns are patent. Vascular: No hyperdense vessel. Skull: No acute fracture or focal lesion. Sinuses/Orbits: Paranasal sinuses and mastoid air cells are clear. The orbits are  unremarkable. Other: None. IMPRESSION: No acute intracranial abnormality. Electronically Signed   By: Iven Finn M.D.   On: 07/23/2021 18:50   CT ABDOMEN PELVIS W CONTRAST  Result Date: 07/25/2021 CLINICAL DATA:  LLQ abdominal pain EXAM: CT ABDOMEN AND PELVIS WITH CONTRAST TECHNIQUE: Multidetector CT imaging of the abdomen and pelvis was performed using the standard protocol following bolus administration of intravenous contrast. RADIATION DOSE REDUCTION: This exam was performed according to the departmental dose-optimization program which includes automated exposure control, adjustment of the mA and/or kV according to patient size and/or use of iterative reconstruction technique. CONTRAST:  174mL OMNIPAQUE IOHEXOL 300 MG/ML  SOLN COMPARISON:  CT renal 06/17/2020 FINDINGS: Lower chest: No acute abnormality. Hepatobiliary: The hepatic parenchyma is diffusely hypodense compared to the splenic parenchyma consistent with fatty infiltration. No focal liver abnormality. Status post cholecystectomy. No biliary dilatation. Pancreas: No focal lesion. Normal pancreatic contour. No surrounding inflammatory changes. No main pancreatic ductal dilatation.  Spleen: Normal in size without focal abnormality. Adrenals/Urinary Tract: No adrenal nodule bilaterally. Bilateral kidneys enhance symmetrically. No hydronephrosis. No hydroureter. The urinary bladder is unremarkable. On delayed imaging, there is no urothelial wall thickening and there are no filling defects in the opacified portions of the bilateral collecting systems or ureters. Stomach/Bowel: Stomach is within normal limits. No evidence of bowel wall thickening or dilatation. Appendix appears normal. Vascular/Lymphatic: No abdominal aorta or iliac aneurysm. No abdominal, pelvic, or inguinal lymphadenopathy. Reproductive: Uterus and bilateral adnexa are unremarkable. Other: No intraperitoneal free fluid. No intraperitoneal free gas. No organized fluid collection.  Musculoskeletal: No abdominal wall hernia or abnormality. No suspicious lytic or blastic osseous lesions. No acute displaced fracture. IMPRESSION: 1. Hepatic steatosis. 2. No acute intra-abdominal or intrapelvic abnormality. Electronically Signed   By: Iven Finn M.D.   On: 07/25/2021 16:23   DG Chest Port 1 View  Result Date: 07/25/2021 CLINICAL DATA:  midsternal pain EXAM: PORTABLE CHEST 1 VIEW COMPARISON:  Chest x-ray 11/17/2020, CT chest 05/27/2020 FINDINGS: The heart and mediastinal contours are within normal limits. No focal consolidation. No pulmonary edema. No pleural effusion. No pneumothorax. No acute osseous abnormality. IMPRESSION: No active disease. Electronically Signed   By: Iven Finn M.D.   On: 07/25/2021 16:37    Procedures Procedures    Medications Ordered in ED Medications  acetaminophen (TYLENOL) tablet 650 mg (has no administration in time range)  iohexol (OMNIPAQUE) 300 MG/ML solution 100 mL (100 mLs Intravenous Contrast Given 07/25/21 1549)    ED Course/ Medical Decision Making/ A&P Clinical Course as of 07/25/21 1754  Sun Jul 25, 2021  1509 CBC CBC without anemia or leukocytosis [EC]  1614 Comprehensive metabolic panel(!) CMP unremarkable [EC]  1614 Resp Panel by RT-PCR (Flu A&B, Covid) Nasopharyngeal Swab Respiratory panel negative [EC]  1614 hCG, serum, qualitative Pregnancy test negative [EC]  1615 Lipase, blood Lipase normal [EC]  1615 ED EKG EKG with NSR [EC]  1751 Troponin I (High Sensitivity) [EC]  1751 Troponin I (High Sensitivity) Troponin normal [EC]  1751 DG Chest Port 1 View Chest x-ray negative [EC]  1751 CT ABDOMEN PELVIS W CONTRAST CT with previously known hepatic steatosis.  No acute abnormality. [EC]    Clinical Course User Index [EC] Tonye Pearson, PA-C                           Medical Decision Making Amount and/or Complexity of Data Reviewed Labs: ordered. Decision-making details documented in ED Course. Radiology:  ordered. ECG/medicine tests:  Decision-making details documented in ED Course.  Risk OTC drugs. Prescription drug management.   History:  Christina Fox is a 41 y.o. female with history of cholecystectomy, IBS and GERD.  Who presents to the ED for evaluation of sudden onset epigastric and left-sided abdominal pain that occurred 1 hour prior to arrival.  Patient states that pain feels worse than childbirth.  She notes that symptoms do not feel like GERD or IBS.  She describes pain as sharp with intermittent "squeezing".  No treatment prior to arrival.  No alleviating or aggravating factors.  Patient denies fever, chills, nausea, vomiting, diarrhea.  She denies chest pain, shortness of breath, urinary symptoms.  No recent travel, no known sick contacts, no unusual ingestion. External records from outside source obtained and reviewed including discharge summary from visit 2 days prior in ED Details: Patient with complaints of headache, blurred vision in the setting of hypertension.  She was  referred to neurology. This patient presents to the ED for concern of abdominal pain, this involves an extensive number of treatment options, and is a complaint that carries with it a high risk of complications and morbidity.   The differential diagnosis for generalized abdominal pain includes, but is not limited to AAA, gastroenteritis, appendicitis, Bowel obstruction, Bowel perforation. Gastroparesis, DKA, Hernia, Inflammatory bowel disease, mesenteric ischemia, pancreatitis, peritonitis SBP, volvulus.   Initial impression:  Patient is nontoxic-appearing, vitals are normal.  Physical exam was significant only for left-sided abdominal tenderness to palpation.  Given the acute nature of her pain, will proceed with CT abdomen for evaluation along with basic labs.  Lab Tests and EKG:  I Ordered, reviewed, and interpreted labs and EKG.  The pertinent results in my decision-making regarding them are detailed in  the ED course and/or initial impression section above.   Imaging Studies ordered:  I ordered imaging studies including CT abdomen and chest x-ray I independently visualized and interpreted imaging and I agree with the radiologist interpretation. Decisions made regarding results are detailed in the ED course and/or initial impression section above.   Cardiac Monitoring:  The patient was maintained on a cardiac monitor.  I personally viewed and interpreted the cardiac monitored which showed an underlying rhythm of: NSR   Medicines ordered and prescription drug management:  I ordered medication including: Tylenol 650 mg for pain Reevaluation of the patient after these medicines showed that the patient improved I have reviewed the patients home medicines and have made adjustments as needed   Disposition:  After consideration of the diagnostic results, physical exam, history and the patients response to treatment feel that the patent would benefit from discharge with GI follow-up.   Epigastric pain Viral gastroenteritis: Patient's physical exam and work-up are very reassuring.  Her labs were all normal.  EKG was normal.  CT of the abdomen and chest x-ray without any acute pathology.  It is possible that given how quickly she presented to the ED after symptoms developed, this is early signs of a viral gastroenteritis.  Advised that symptoms may progress to vomiting and diarrhea.  Otherwise she is to call her GI doctor tomorrow to follow-up.  Discussed all labs and imaging with patient.  All questions asked and answered.  She is discharged home in good condition.   Final Clinical Impression(s) / ED Diagnoses Final diagnoses:  Acute epigastric pain  Viral gastroenteritis    Rx / DC Orders ED Discharge Orders     None         Tonye Pearson, PA-C 07/25/21 Imogene, [place], DO 07/26/21 [phone]

## 2021-07-25 NOTE — Discharge Instructions (Addendum)
Your work-up today was overall very reassuring.  CT was negative for pancreatitis, colitis or diverticulitis.  Your labs are all normal.  There does not appear to be an emergent cause for your abdominal pain.  I would give your GI doctor call tomorrow and follow-up with them.  It is possible that you have early signs of a viral gastroenteritis given the squeezing sensation of your stomach.  You may notice that you develop nausea, vomiting or diarrhea over the next few days.  Please drink plenty of water.  You state you have a prescription of Zofran at home and you can use that as needed as well.  Feel better soon.

## 2021-07-25 NOTE — ED Triage Notes (Signed)
Pt compains of sharp midsternal abd pain started 1 hour ago. Denies any vomiting or diarrhea.

## 2021-07-25 NOTE — ED Notes (Signed)
EMT-P provided AVS using Teachback Method. Patient verbalizes understanding of Discharge Instructions. Opportunity for Questioning and Answers were provided by EMT-P. Patient Discharged from ED.  ? ?

## 2021-07-29 ENCOUNTER — Telehealth: Payer: Medicaid Other | Admitting: Family Medicine

## 2021-07-29 DIAGNOSIS — R300 Dysuria: Secondary | ICD-10-CM

## 2021-07-30 NOTE — Progress Notes (Signed)
Christina Fox   Is on an antibiotic at this time, that can provide coverage for UTI if this is one. Suggest in person eval if continue symptoms.

## 2021-08-10 ENCOUNTER — Other Ambulatory Visit: Payer: Self-pay

## 2021-08-10 ENCOUNTER — Emergency Department
Admission: RE | Admit: 2021-08-10 | Discharge: 2021-08-10 | Disposition: A | Discharge status: Home / Self Care | Payer: Medicaid Other | Source: Ambulatory Visit | Attending: Family Medicine | Admitting: Family Medicine

## 2021-08-10 VITALS — BP 154/93 | HR 92 | Temp 98.3°F | Resp 14

## 2021-08-10 DIAGNOSIS — J3489 Other specified disorders of nose and nasal sinuses: Secondary | ICD-10-CM

## 2021-08-10 LAB — POC SARS CORONAVIRUS 2 AG -  ED: SARS Coronavirus 2 Ag: NEGATIVE

## 2021-08-10 MED ORDER — PREDNISONE 20 MG PO TABS: 40.0000 mg | ORAL_TABLET | Freq: Every day | ORAL | 0 refills | Status: DC

## 2021-08-10 MED ORDER — METHYLPREDNISOLONE SODIUM SUCC 125 MG IJ SOLR
80.0000 mg | Freq: Once | INTRAMUSCULAR | Status: AC
  Administered 2021-08-10: 80 mg via INTRAMUSCULAR

## 2021-08-10 NOTE — ED Provider Notes (Addendum)
[name]    CSN: [id number] Arrival date & time: 08/10/21  1759      History   Chief Complaint Chief Complaint  Patient presents with   Facial Pain   Nausea   head pressure    HPI Christina Fox is a 41 y.o. female.   HPI  Patient states that all day she has had a headache, face pressure, nausea, feeling like she was coming down with something.  She does not have any fever or chills.  No runny nose or cough.  No history of headache or migraines. She is under an extraordinary amount of stress.  Within the last year she has job stress, miscarriage, and the death of her fianc.  These are all difficult anyway, exacerbated by Valentine's Day today  Past Medical History:  Diagnosis Date   Abdominal pain    "right lower quadrant pain and right lower back pain   Anxiety    Deviated septum 09/2011   GERD (gastroesophageal reflux disease)    Headache(784.0)    sinus   History of echocardiogram    Echo 11/17: EF 55-60, no RWMA, normal diastolic function   Hypertension    no meds   Kidney stones    no current problem   Migraine    Nasal turbinate hypertrophy 09/2011   bilat.   Pelvic inflammatory disease    Urticaria     Patient Active Problem List   Diagnosis Date Noted   Upper airway cough syndrome 07/08/2020   Gallstone 01/07/2016   History of colonic polyps 11/27/2015   Right lower quadrant abdominal pain 11/27/2015   HTN (hypertension), benign 11/24/2015   Abdominal pain 11/16/2015   Headache 10/13/2015   Thyromegaly 11/03/2014   Anxiety disorder 11/03/2014    Past Surgical History:  Procedure Laterality Date   CHOLECYSTECTOMY N/A 01/15/2016   Procedure: LAPAROSCOPIC CHOLECYSTECTOMY WITH INTRAOPERATIVE CHOLANGIOGRAM;  Surgeon: Christene Lye, MD;  Location: [facility];  Service: General;  Laterality: N/A;   COLONOSCOPY  2007   [facility]: pedunculated 52mm polyp in distal sigmoid, sessile 10mm polyp at splenic flexure, larger polyp  tubulovillous adenoma and smaller polyp tubular adenoma    COLONOSCOPY  09-09-15   COLONOSCOPY WITH PROPOFOL N/A 03/01/2016   Procedure: COLONOSCOPY WITH PROPOFOL;  Surgeon: Garlan Fair, MD;  Location: [facility];  Service: Endoscopy;  Laterality: N/A;   NASAL SEPTOPLASTY W/ TURBINOPLASTY  10/04/2011   Procedure: NASAL SEPTOPLASTY WITH TURBINATE REDUCTION;  Surgeon: Ascencion Dike, MD;  Location: [facility];  Service: ENT;  Laterality: Bilateral;   WISDOM TOOTH EXTRACTION  2009    OB History     Gravida  5   Para  4   Term  4   Preterm      AB  1   Living  4      SAB  1   IAB      Ectopic      Multiple      Live Births  4        Obstetric Comments  1st Menstrual Cycle:  10  1st Pregnancy:  16           Home Medications    Prior to Admission medications   Medication Sig Start Date End Date Taking? Authorizing Provider  Ascorbic Acid (VITAMIN C) 100 MG tablet Take 100 mg by mouth daily.   Yes [provider]  levocetirizine (XYZAL) 5 MG tablet Take 5 mg by mouth every  evening.   Yes [provider]  predniSONE (DELTASONE) 20 MG tablet Take 2 tablets (40 mg total) by mouth daily with breakfast. 08/10/21  Yes Raylene Everts, MD  triamterene-hydrochlorothiazide (DYAZIDE) 37.5-25 MG capsule Take 1 capsule by mouth daily.   Yes [provider]  zinc sulfate 220 (50 Zn) MG capsule Take 220 mg by mouth daily.   Yes [provider]  acetaminophen (TYLENOL) 500 MG tablet Take 500-1,000 mg by mouth every 6 (six) hours as needed for mild pain.     [provider]  albuterol (VENTOLIN HFA) 108 (90 Base) MCG/ACT inhaler Inhale 2 puffs into the lungs every 6 (six) hours as needed for wheezing or shortness of breath.    [provider]  clonazePAM (KLONOPIN) 0.5 MG tablet Take 0.5 mg by mouth 2 (two) times daily as needed for anxiety.     [provider]  fluticasone (FLONASE) 50 MCG/ACT nasal  spray Place 2 sprays into both nostrils daily. 02/08/21   Perlie Mayo, NP  ibuprofen (ADVIL) 800 MG tablet Take 1 tablet (800 mg total) by mouth every 8 (eight) hours as needed. 07/23/20   Shelly Bombard, MD  montelukast (SINGULAIR) 10 MG tablet Take 10 mg by mouth at bedtime.    [provider]  omeprazole (PRILOSEC) 40 MG capsule Take 40 mg by mouth daily.     [provider]  potassium chloride SA (KLOR-CON M20) 20 MEQ tablet Take 1 tablet (20 mEq total) by mouth daily. 07/23/21 08/22/21  Loni Beckwith, PA-C  Vitamin D, Ergocalciferol, (DRISDOL) 1.25 MG (50000 UNIT) CAPS capsule Take 50,000 Units by mouth See admin instructions. Twice weekly    [provider]  escitalopram (LEXAPRO) 10 MG tablet Take 10 mg by mouth daily.    09/02/11  [provider]    Family History Family History  Problem Relation Age of Onset   Kidney cancer Mother    Hypertension Mother    Migraines Mother    Multiple sclerosis Mother    Fibromyalgia Mother    Liver disease Mother        NASH    Hypertension Father    Liver disease Father        liver transplant; had fatty liver disease   Spina bifida Brother    Migraines Maternal Grandmother    Hypertension Other    Colon cancer Paternal Grandfather    Allergic rhinitis Daughter    Asthma Daughter    Allergic rhinitis Son    Asthma Son    Angioedema Neg Hx    Eczema Neg Hx    Immunodeficiency Neg Hx    Urticaria Neg Hx     Social History Social History   Tobacco Use   Smoking status: Every Day    Packs/day: 0.25    Years: 10.00    Pack years: 2.50    Types: Cigarettes   Smokeless tobacco: Never   Tobacco comments:    2-3 cigarettes a day  Vaping Use   Vaping Use: Never used  Substance Use Topics   Alcohol use: Yes    Alcohol/week: 0.0 standard drinks    Comment: ocassionally   Drug use: No     Allergies   Clarithromycin, Doxycycline, Bentyl [dicyclomine hcl], Haldol [haloperidol],  Meclizine, Morphine and related, Toradol [ketorolac tromethamine], Nitrofurantoin, and Sulfa antibiotics   Review of Systems Review of Systems See HPI  Physical Exam Triage Vital Signs ED Triage Vitals  Enc Vitals Group  BP 08/10/21 1807 (!) 154/93     Pulse Rate 08/10/21 1807 92     Resp 08/10/21 1807 14     Temp 08/10/21 1807 98.3 F (36.8 C)     Temp Source 08/10/21 1807 Oral     SpO2 08/10/21 1807 99 %     Weight --      Height --      Head Circumference --      Peak Flow --      Pain Score 08/10/21 1809 6     Pain Loc --      Pain Edu? --      Excl. in [place]? --    No data found.  Updated Vital Signs BP (!) 154/93 (BP Location: Left Arm)    Pulse 92    Temp 98.3 F (36.8 C) (Oral)    Resp 14    LMP 07/18/2021 (Approximate) Comment: neg preg test   SpO2 99%      Physical Exam Constitutional:      General: She is not in acute distress.    Appearance: She is well-developed.     Comments: Patient is pleasant.  Overweight.  No acute distress  HENT:     Head: Normocephalic and atraumatic.     Right Ear: Tympanic membrane and ear canal normal.     Left Ear: Tympanic membrane and ear canal normal.     Nose: Congestion present.     Comments: Nasal membranes are swollen and red.  Nasal polyps suspected    Mouth/Throat:     Mouth: Mucous membranes are moist.     Pharynx: No oropharyngeal exudate or posterior oropharyngeal erythema.  Eyes:     Conjunctiva/sclera: Conjunctivae normal.     Pupils: Pupils are equal, round, and reactive to light.  Cardiovascular:     Rate and Rhythm: Normal rate and regular rhythm.     Heart sounds: Normal heart sounds.  Pulmonary:     Effort: Pulmonary effort is normal. No respiratory distress.     Breath sounds: Normal breath sounds.  Abdominal:     General: There is no distension.     Palpations: Abdomen is soft.  Musculoskeletal:        General: Normal range of motion.     Cervical back: Normal range of motion.   Lymphadenopathy:     Cervical: Cervical adenopathy present.  Skin:    General: Skin is warm and dry.  Neurological:     General: No focal deficit present.     Mental Status: She is alert.  Psychiatric:        Mood and Affect: Mood normal.     UC Treatments / Results  Labs (all labs ordered are listed, but only abnormal results are displayed) Labs Reviewed  POC SARS CORONAVIRUS 2 AG -  ED    EKG   Radiology No results found.  Procedures Procedures (including critical care time)  Medications Ordered in UC Medications  methylPREDNISolone sodium succinate (SOLU-MEDROL) 125 mg/2 mL injection 80 mg (80 mg Intramuscular Given 08/10/21 1859)    Initial Impression / Assessment and Plan / UC Course  I have reviewed the triage vital signs and the nursing notes.  Pertinent labs & imaging results that were available during my care of the patient were reviewed by me and considered in my medical decision making (see chart for details).     The COVID test is negative.  Patient feels like she has sinus pressure from perhaps allergies or  cold.  We will get a give her a shot of Solu-Medrol followed by daily prednisone until her symptoms improve. Final Clinical Impressions(s) / UC Diagnoses   Final diagnoses:  Sinus pain     Discharge Instructions      I am giving you prednisone to take daily for the next few days.  This will help reduce the sinus pressure and congestion, and relieve the headache. See your primary care doctor if your headache and sinus problems continue     ED Prescriptions     Medication Sig Dispense Auth. Provider   predniSONE (DELTASONE) 20 MG tablet Take 2 tablets (40 mg total) by mouth daily with breakfast. 10 tablet Raylene Everts, MD      PDMP not reviewed this encounter.   Raylene Everts, MD 08/10/21 1912    Raylene Everts, MD 08/10/21 [id number]

## 2021-08-10 NOTE — Discharge Instructions (Addendum)
I am giving you prednisone to take daily for the next few days.  This will help reduce the sinus pressure and congestion, and relieve the headache. See your primary care doctor if your headache and sinus problems continue

## 2021-08-10 NOTE — ED Triage Notes (Signed)
Pt presents with fatigue, facial and head pressure and nausea that began today. Pt endorses having high stress levels over the last 3 months.

## 2021-08-17 ENCOUNTER — Ambulatory Visit: Payer: Medicaid Other

## 2021-08-25 ENCOUNTER — Telehealth: Payer: Medicaid Other | Admitting: Physician Assistant

## 2021-08-25 DIAGNOSIS — J029 Acute pharyngitis, unspecified: Secondary | ICD-10-CM

## 2021-08-25 NOTE — Progress Notes (Signed)
?  E-Visit for Sore Throat ? ?We are sorry that you are not feeling well.  Here is how we plan to help! ? ?Your symptoms indicate a likely viral infection (Pharyngitis).   Pharyngitis is inflammation in the back of the throat which can cause a sore throat, scratchiness and sometimes difficulty swallowing.   Pharyngitis is typically caused by a respiratory virus and will just run its course.  Please keep in mind that your symptoms could last up to 10 days.  For throat pain, we recommend over the counter oral pain relief medications such as acetaminophen or aspirin, or anti-inflammatory medications such as ibuprofen or naproxen sodium.  Topical treatments such as oral throat lozenges or sprays may be used as needed.  Avoid close contact with loved ones, especially the very young and elderly.  Remember to wash your hands thoroughly throughout the day as this is the number one way to prevent the spread of infection and wipe down door knobs and counters with disinfectant. ? ?After careful review of your answers, I would not recommend an antibiotic for your condition.  Antibiotics should not be used to treat conditions that we suspect are caused by viruses like the virus that causes the common cold or flu. However, some people can have Strep with atypical symptoms. You may need formal testing in clinic or office to confirm if your symptoms continue or worsen. ? ?Providers prescribe antibiotics to treat infections caused by bacteria. Antibiotics are very powerful in treating bacterial infections when they are used properly.  To maintain their effectiveness, they should be used only when necessary.  Overuse of antibiotics has resulted in the development of super bugs that are resistant to treatment!   ? ?Home Care: ?Only take medications as instructed by your medical team. ?Do not drink alcohol while taking these medications. ?A steam or ultrasonic humidifier can help congestion.  You can place a towel over your head and  breathe in the steam from hot water coming from a faucet. ?Avoid close contacts especially the very young and the elderly. ?Cover your mouth when you cough or sneeze. ?Always remember to wash your hands. ? ?Get Help Right Away If: ?You develop worsening fever or throat pain. ?You develop a severe head ache or visual changes. ?Your symptoms persist after you have completed your treatment plan. ? ?Make sure you ?Understand these instructions. ?Will watch your condition. ?Will get help right away if you are not doing well or get worse. ? ? ?Thank you for choosing an e-visit. ? ?Your e-visit answers were reviewed by a board certified advanced clinical practitioner to complete your personal care plan. Depending upon the condition, your plan could have included both over the counter or prescription medications. ? ?Please review your pharmacy choice. Make sure the pharmacy is open so you can pick up prescription now. If there is a problem, you may contact your provider through [employer] and have the prescription routed to another pharmacy.  Your safety is important to [place]. If you have drug allergies check your prescription carefully.  ? ?For the next 24 hours you can use MyChart to ask questions about today's visit, request a non-urgent call back, or ask for a work or school excuse. ?You will get an email in the next two days asking about your experience. I hope that your e-visit has been valuable and will speed your recovery. ? ?I provided 5 minutes of non face-to-face time during this encounter for chart review and documentation.  ? ?

## 2021-08-31 ENCOUNTER — Telehealth: Payer: Medicaid Other | Admitting: Physician Assistant

## 2021-08-31 DIAGNOSIS — R3989 Other symptoms and signs involving the genitourinary system: Secondary | ICD-10-CM

## 2021-08-31 MED ORDER — CEPHALEXIN 500 MG PO CAPS: 500.0000 mg | ORAL_CAPSULE | Freq: Two times a day (BID) | ORAL | 0 refills | Status: DC

## 2021-08-31 NOTE — Progress Notes (Signed)

## 2021-09-20 ENCOUNTER — Other Ambulatory Visit: Payer: Self-pay

## 2021-09-20 ENCOUNTER — Encounter: Payer: Self-pay | Admitting: Emergency Medicine

## 2021-09-20 ENCOUNTER — Emergency Department (INDEPENDENT_AMBULATORY_CARE_PROVIDER_SITE_OTHER)
Admission: EM | Admit: 2021-09-20 | Discharge: 2021-09-20 | Disposition: A | Discharge status: Home / Self Care | Payer: Medicaid Other | Source: Home / Self Care | Attending: Family Medicine | Admitting: Family Medicine

## 2021-09-20 ENCOUNTER — Emergency Department: Admit: 2021-09-20 | Discharge status: Absent without leave | Payer: Self-pay

## 2021-09-20 DIAGNOSIS — J069 Acute upper respiratory infection, unspecified: Secondary | ICD-10-CM

## 2021-09-20 LAB — POC SARS CORONAVIRUS 2 AG -  ED: SARS Coronavirus 2 Ag: NEGATIVE

## 2021-09-20 NOTE — ED Triage Notes (Signed)
Pt c/o cough and fatigue x2 days. States she was seen in ED yesterday for chest pain but did not have a covid test and today took one at home and thought it may be positive but line was faint.  ?

## 2021-09-20 NOTE — ED Provider Notes (Signed)
?[facility] ? ? ? ?CSN: [id number] ?Arrival date & time: 09/20/21  1633 ? ? ?  ? ?History   ?Chief Complaint ?Chief Complaint  ?Patient presents with  ? Cough  ? ? ?HPI ?Christina Fox is a 41 y.o. female.  ? ?HPI ? ?Patient has not felt well for 2 days.  She has had cough and fatigue.  Yesterday she had some chest pain.  She went to the emergency room.  They did a troponin blood work EKG and a D-dimer.  She left for however the exam was completed.  She went home and did a COVID test.  She thinks the COVID test may be positive.  She is here for follow-up ?No known exposure covid ? ?Past Medical History:  ?Diagnosis Date  ? Abdominal pain   ? "right lower quadrant pain and right lower back pain  ? Anxiety   ? Deviated septum 09/2011  ? GERD (gastroesophageal reflux disease)   ? Headache(784.0)   ? sinus  ? History of echocardiogram   ? Echo 11/17: EF 55-60, no RWMA, normal diastolic function  ? Hypertension   ? no meds  ? Kidney stones   ? no current problem  ? Migraine   ? Nasal turbinate hypertrophy 09/2011  ? bilat.  ? Pelvic inflammatory disease   ? Urticaria   ? ? ?Patient Active Problem List  ? Diagnosis Date Noted  ? Upper airway cough syndrome 07/08/2020  ? Gallstone 01/07/2016  ? History of colonic polyps 11/27/2015  ? Right lower quadrant abdominal pain 11/27/2015  ? HTN (hypertension), benign 11/24/2015  ? Abdominal pain 11/16/2015  ? Headache 10/13/2015  ? Thyromegaly 11/03/2014  ? Anxiety disorder 11/03/2014  ? ? ?Past Surgical History:  ?Procedure Laterality Date  ? CHOLECYSTECTOMY N/A 01/15/2016  ? Procedure: LAPAROSCOPIC CHOLECYSTECTOMY WITH INTRAOPERATIVE CHOLANGIOGRAM;  Surgeon: Christene Lye, MD;  Location: [facility];  Service: General;  Laterality: N/A;  ? COLONOSCOPY  2007  ? [facility]: pedunculated 47m polyp in distal sigmoid, sessile 283mpolyp at splenic flexure, larger polyp tubulovillous adenoma and smaller polyp tubular adenoma   ? COLONOSCOPY  09-09-15  ? COLONOSCOPY WITH  PROPOFOL N/A 03/01/2016  ? Procedure: COLONOSCOPY WITH PROPOFOL;  Surgeon: MaGarlan FairMD;  Location: [facility];  Service: Endoscopy;  Laterality: N/A;  ? NASAL SEPTOPLASTY W/ TURBINOPLASTY  10/04/2011  ? Procedure: NASAL SEPTOPLASTY WITH TURBINATE REDUCTION;  Surgeon: SuAscencion DikeMD;  Location: [facility] Service: ENT;  Laterality: Bilateral;  ? WISDOM TOOTH EXTRACTION  2009  ? ? ?OB History   ? ? Gravida  ?5  ? Para  ?4  ? Term  ?4  ? Preterm  ?   ? AB  ?1  ? Living  ?4  ?  ? ? SAB  ?1  ? IAB  ?   ? Ectopic  ?   ? Multiple  ?   ? Live Births  ?4  ?   ?  ? Obstetric Comments  ?1st Menstrual Cycle:  10  ?1st Pregnancy:  16 ?  ?  ? ?  ? ? ? ?Home Medications   ? ?Prior to Admission medications   ?Medication Sig Start Date End Date Taking? Authorizing Provider  ?acetaminophen (TYLENOL) 500 MG tablet Take 500-1,000 mg by mouth every 6 (six) hours as needed for mild pain.     [provider]  ?albuterol (VENTOLIN HFA) 108 (90 Base) MCG/ACT inhaler Inhale 2 puffs into the  lungs every 6 (six) hours as needed for wheezing or shortness of breath.    [provider]  ?Ascorbic Acid (VITAMIN C) 100 MG tablet Take 100 mg by mouth daily.    [provider]  ?clonazePAM (KLONOPIN) 0.5 MG tablet Take 0.5 mg by mouth 2 (two) times daily as needed for anxiety.     [provider]  ?fluticasone (FLONASE) 50 MCG/ACT nasal spray Place 2 sprays into both nostrils daily. 02/08/21   Perlie Mayo, NP  ?ibuprofen (ADVIL) 800 MG tablet Take 1 tablet (800 mg total) by mouth every 8 (eight) hours as needed. 07/23/20   Shelly Bombard, MD  ?levocetirizine (XYZAL) 5 MG tablet Take 5 mg by mouth every evening.    [provider]  ?montelukast (SINGULAIR) 10 MG tablet Take 10 mg by mouth at bedtime.    [provider]  ?omeprazole (PRILOSEC) 40 MG capsule Take 40 mg by mouth daily.     [provider]  ?potassium chloride SA (KLOR-CON M20) 20 MEQ tablet Take 1  tablet (20 mEq total) by mouth daily. 07/23/21 08/22/21  Loni Beckwith, PA-C  ?Vitamin D, Ergocalciferol, (DRISDOL) 1.25 MG (50000 UNIT) CAPS capsule Take 50,000 Units by mouth See admin instructions. Twice weekly    [provider]  ?zinc sulfate 220 (50 Zn) MG capsule Take 220 mg by mouth daily.    [provider]  ?escitalopram (LEXAPRO) 10 MG tablet Take 10 mg by mouth daily.    09/02/11  [provider]  ? ? ?Family History ?Family History  ?Problem Relation Age of Onset  ? Kidney cancer Mother   ? Hypertension Mother   ? Migraines Mother   ? Multiple sclerosis Mother   ? Fibromyalgia Mother   ? Liver disease Mother   ?     NASH   ? Hypertension Father   ? Liver disease Father   ?     liver transplant; had fatty liver disease  ? Spina bifida Brother   ? Migraines Maternal Grandmother   ? Hypertension Other   ? Colon cancer Paternal Grandfather   ? Allergic rhinitis Daughter   ? Asthma Daughter   ? Allergic rhinitis Son   ? Asthma Son   ? Angioedema Neg Hx   ? Eczema Neg Hx   ? Immunodeficiency Neg Hx   ? Urticaria Neg Hx   ? ? ?Social History ?Social History  ? ?Tobacco Use  ? Smoking status: Every Day  ?  Packs/day: 0.25  ?  Years: 10.00  ?  Pack years: 2.50  ?  Types: Cigarettes  ? Smokeless tobacco: Never  ? Tobacco comments:  ?  2-3 cigarettes a day  ?Vaping Use  ? Vaping Use: Never used  ?Substance Use Topics  ? Alcohol use: Yes  ?  Alcohol/week: 0.0 standard drinks  ?  Comment: ocassionally  ? Drug use: No  ? ? ? ?Allergies   ?Clarithromycin, Doxycycline, Bentyl [dicyclomine hcl], Haldol [haloperidol], Meclizine, Morphine and related, Toradol [ketorolac tromethamine], Nitrofurantoin, and Sulfa antibiotics ? ? ?Review of Systems ?Review of Systems ?See HPI ? ?Physical Exam ?Triage Vital Signs ?ED Triage Vitals  ?Enc Vitals Group  ?   BP 09/20/21 1705 126/89  ?   Pulse Rate 09/20/21 1705 92  ?   Resp --   ?   Temp 09/20/21 1704 98.4 ?F (36.9 ?C)  ?   Temp Source 09/20/21 1704  Oral  ?   SpO2 09/20/21 1705 99 %  ?  Weight --   ?   Height --   ?   Head Circumference --   ?   Peak Flow --   ?   Pain Score 09/20/21 1706 0  ?   Pain Loc --   ?   Pain Edu? --   ?   Excl. in [place]? --   ? ?No data found. ? ?Updated Vital Signs ?BP 126/89 (BP Location: Right Arm)   Pulse 92   Temp 98.4 ?F (36.9 ?C) (Oral)   LMP 09/06/2021   SpO2 99%  ?   ? ?Physical Exam ?Constitutional:   ?   General: She is not in acute distress. ?   Appearance: She is well-developed. She is obese. She is not ill-appearing.  ?HENT:  ?   Head: Normocephalic and atraumatic.  ?   Right Ear: Tympanic membrane and ear canal normal.  ?   Left Ear: Tympanic membrane and ear canal normal.  ?   Nose: Nose normal. No congestion.  ?   Mouth/Throat:  ?   Mouth: Mucous membranes are moist.  ?   Pharynx: Posterior oropharyngeal erythema present.  ?   Comments: Mild erythema posterior pharynx ?Eyes:  ?   Conjunctiva/sclera: Conjunctivae normal.  ?   Pupils: Pupils are equal, round, and reactive to light.  ?Cardiovascular:  ?   Rate and Rhythm: Normal rate and regular rhythm.  ?   Heart sounds: Normal heart sounds.  ?Pulmonary:  ?   Effort: Pulmonary effort is normal. No respiratory distress.  ?   Breath sounds: Normal breath sounds.  ?Chest:  ?   Chest wall: No tenderness.  ?Abdominal:  ?   General: There is no distension.  ?   Palpations: Abdomen is soft.  ?Musculoskeletal:     ?   General: Normal range of motion.  ?   Cervical back: Normal range of motion.  ?Lymphadenopathy:  ?   Cervical: Cervical adenopathy present.  ?Skin: ?   General: Skin is warm and dry.  ?Neurological:  ?   Mental Status: She is alert.  ?Psychiatric:     ?   Mood and Affect: Mood normal.     ?   Behavior: Behavior normal.  ? ? ? ?UC Treatments / Results  ?Labs ?(all labs ordered are listed, but only abnormal results are displayed) ?Labs Reviewed  ?POC SARS CORONAVIRUS 2 AG -  ED  ? ? ?EKG ? ? ?Radiology ?No results found. ? ?Procedures ?Procedures (including  critical care time) ? ?Medications Ordered in UC ?Medications - No data to display ? ?Initial Impression / Assessment and Plan / UC Course  ?I have reviewed the triage vital signs and the nursing notes. ? ?Perti

## 2021-09-20 NOTE — Discharge Instructions (Signed)
Rest.  Drink lots of fluids ?Continue Flonase and your over-the-counter medications ?See your primary care doctor if you do not improve by next week ?

## 2021-09-22 ENCOUNTER — Ambulatory Visit: Payer: Medicaid Other | Admitting: Obstetrics

## 2021-09-26 ENCOUNTER — Telehealth: Payer: Medicaid Other | Admitting: Physician Assistant

## 2021-09-26 DIAGNOSIS — R3989 Other symptoms and signs involving the genitourinary system: Secondary | ICD-10-CM | POA: Diagnosis not present

## 2021-09-27 MED ORDER — CEPHALEXIN 500 MG PO CAPS: 500.0000 mg | ORAL_CAPSULE | Freq: Two times a day (BID) | ORAL | 0 refills | Status: DC

## 2021-09-27 NOTE — Progress Notes (Signed)

## 2021-10-04 ENCOUNTER — Emergency Department
Admission: RE | Admit: 2021-10-04 | Discharge: 2021-10-04 | Disposition: A | Discharge status: Home / Self Care | Payer: Medicaid Other | Source: Ambulatory Visit | Attending: Family Medicine | Admitting: Family Medicine

## 2021-10-04 VITALS — BP 141/89 | HR 96 | Temp 98.4°F | Resp 16

## 2021-10-04 DIAGNOSIS — M545 Low back pain, unspecified: Secondary | ICD-10-CM | POA: Diagnosis not present

## 2021-10-04 DIAGNOSIS — R110 Nausea: Secondary | ICD-10-CM | POA: Diagnosis not present

## 2021-10-04 LAB — POCT URINE PREGNANCY: Preg Test, Ur: NEGATIVE

## 2021-10-04 LAB — POCT URINALYSIS DIP (MANUAL ENTRY)
Glucose, UA: NEGATIVE mg/dL
Ketones, POC UA: NEGATIVE mg/dL
Nitrite, UA: NEGATIVE
Protein Ur, POC: 100 mg/dL — AB
Spec Grav, UA: 1.025 (ref 1.010–1.025)
Urobilinogen, UA: 0.2 E.U./dL
pH, UA: 6 (ref 5.0–8.0)

## 2021-10-04 MED ORDER — ONDANSETRON 4 MG PO TBDP: 4.0000 mg | ORAL_TABLET | Freq: Three times a day (TID) | ORAL | 0 refills | Status: DC | PRN

## 2021-10-04 NOTE — Discharge Instructions (Signed)
May use heat to painful back area ?Take your ibuprofen 3 times a day with food ?Take Zofran if needed for nausea or vomiting ?Drink lots of fluids ?See your doctor if not better in a couple days ?

## 2021-10-04 NOTE — ED Provider Notes (Signed)
?[place] ? ? ? ?CSN: [id number] ?Arrival date & time: 10/04/21  1138 ? ? ?  ? ?History   ?Chief Complaint ?Chief Complaint  ?Patient presents with  ? Nausea  ?  Entered by patient  ? ? ?HPI ?Christina Fox is a 41 y.o. female.  ? ?HPI ? ?Patient states she has had fatigue through the weekend.  Nausea since this morning.  Concern for pregnancy.  She also has some low back pain that radiates into her right leg.  No accident or injury.  No overuse.  Patient had some urinary symptoms when she was out of town and had a video visit.  She was prescribed Keflex.  She is currently taking Keflex.  Denies any cough cold runny nose or sore throat. ? ?Past Medical History:  ?Diagnosis Date  ? Abdominal pain   ? "right lower quadrant pain and right lower back pain  ? Anxiety   ? Deviated septum 09/2011  ? GERD (gastroesophageal reflux disease)   ? Headache(784.0)   ? sinus  ? History of echocardiogram   ? Echo 11/17: EF 55-60, no RWMA, normal diastolic function  ? Hypertension   ? no meds  ? Kidney stones   ? no current problem  ? Migraine   ? Nasal turbinate hypertrophy 09/2011  ? bilat.  ? Pelvic inflammatory disease   ? Urticaria   ? ? ?Patient Active Problem List  ? Diagnosis Date Noted  ? Upper airway cough syndrome 07/08/2020  ? Gallstone 01/07/2016  ? History of colonic polyps 11/27/2015  ? Right lower quadrant abdominal pain 11/27/2015  ? HTN (hypertension), benign 11/24/2015  ? Abdominal pain 11/16/2015  ? Headache 10/13/2015  ? Thyromegaly 11/03/2014  ? Anxiety disorder 11/03/2014  ? ? ?Past Surgical History:  ?Procedure Laterality Date  ? CHOLECYSTECTOMY N/A 01/15/2016  ? Procedure: LAPAROSCOPIC CHOLECYSTECTOMY WITH INTRAOPERATIVE CHOLANGIOGRAM;  Surgeon: Christene Lye, MD;  Location: [facility] ORS;  Service: General;  Laterality: N/A;  ? COLONOSCOPY  2007  ? [facility]: pedunculated 67m polyp in distal sigmoid, sessile 255mpolyp at splenic flexure, larger polyp tubulovillous adenoma and smaller polyp  tubular adenoma   ? COLONOSCOPY  09-09-15  ? COLONOSCOPY WITH PROPOFOL N/A 03/01/2016  ? Procedure: COLONOSCOPY WITH PROPOFOL;  Surgeon: MaGarlan FairMD;  Location: [facility];  Service: Endoscopy;  Laterality: N/A;  ? NASAL SEPTOPLASTY W/ TURBINOPLASTY  10/04/2011  ? Procedure: NASAL SEPTOPLASTY WITH TURBINATE REDUCTION;  Surgeon: SuAscencion DikeMD;  [place] [facility] Service: ENT;  Laterality: Bilateral;  ? WISDOM TOOTH EXTRACTION  2009  ? ? ?OB History   ? ? Gravida  ?5  ? Para  ?4  ? Term  ?4  ? Preterm  ?   ? AB  ?1  ? Living  ?4  ?  ? ? SAB  ?1  ? IAB  ?   ? Ectopic  ?   ? Multiple  ?   ? Live Births  ?4  ?   ?  ? Obstetric Comments  ?1st Menstrual Cycle:  10  ?1st Pregnancy:  16 ?  ?  ? ?  ? ? ? ?Home Medications   ? ?Prior to Admission medications   ?Medication Sig Start Date End Date Taking? Authorizing Provider  ?ondansetron (ZOFRAN-ODT) 4 MG disintegrating tablet Take 1 tablet (4 mg total) by mouth every 8 (eight) hours as needed for nausea or vomiting. 10/04/21  Yes NeRaylene EvertsMD  ?acetaminophen (  TYLENOL) 500 MG tablet Take 500-1,000 mg by mouth every 6 (six) hours as needed for mild pain.     [provider]  ?albuterol (VENTOLIN HFA) 108 (90 Base) MCG/ACT inhaler Inhale 2 puffs into the lungs every 6 (six) hours as needed for wheezing or shortness of breath.    [provider]  ?Ascorbic Acid (VITAMIN C) 100 MG tablet Take 100 mg by mouth daily.    [provider]  ?cephALEXin (KEFLEX) 500 MG capsule Take 1 capsule (500 mg total) by mouth 2 (two) times daily. 09/27/21   Mar Daring, PA-C  ?clonazePAM (KLONOPIN) 0.5 MG tablet Take 0.5 mg by mouth 2 (two) times daily as needed for anxiety.     [provider]  ?fluticasone (FLONASE) 50 MCG/ACT nasal spray Place 2 sprays into both nostrils daily. 02/08/21   Perlie Mayo, NP  ?ibuprofen (ADVIL) 800 MG tablet Take 1 tablet (800 mg total) by mouth every 8 (eight) hours as needed. 07/23/20    Shelly Bombard, MD  ?levocetirizine (XYZAL) 5 MG tablet Take 5 mg by mouth every evening.    [provider]  ?montelukast (SINGULAIR) 10 MG tablet Take 10 mg by mouth at bedtime.    [provider]  ?omeprazole (PRILOSEC) 40 MG capsule Take 40 mg by mouth daily.     [provider]  ?potassium chloride SA (KLOR-CON M20) 20 MEQ tablet Take 1 tablet (20 mEq total) by mouth daily. 07/23/21 08/22/21  Loni Beckwith, PA-C  ?Vitamin D, Ergocalciferol, (DRISDOL) 1.25 MG (50000 UNIT) CAPS capsule Take 50,000 Units by mouth See admin instructions. Twice weekly    [provider]  ?zinc sulfate 220 (50 Zn) MG capsule Take 220 mg by mouth daily.    [provider]  ?escitalopram (LEXAPRO) 10 MG tablet Take 10 mg by mouth daily.    09/02/11  [provider]  ? ? ?Family History ?Family History  ?Problem Relation Age of Onset  ? Kidney cancer Mother   ? Hypertension Mother   ? Migraines Mother   ? Multiple sclerosis Mother   ? Fibromyalgia Mother   ? Liver disease Mother   ?     NASH   ? Hypertension Father   ? Liver disease Father   ?     liver transplant; had fatty liver disease  ? Spina bifida Brother   ? Migraines Maternal Grandmother   ? Hypertension Other   ? Colon cancer Paternal Grandfather   ? Allergic rhinitis Daughter   ? Asthma Daughter   ? Allergic rhinitis Son   ? Asthma Son   ? Angioedema Neg Hx   ? Eczema Neg Hx   ? Immunodeficiency Neg Hx   ? Urticaria Neg Hx   ? ? ?Social History ?Social History  ? ?Tobacco Use  ? Smoking status: Every Day  ?  Packs/day: 0.25  ?  Years: 10.00  ?  Pack years: 2.50  ?  Types: Cigarettes  ? Smokeless tobacco: Never  ? Tobacco comments:  ?  2-3 cigarettes a day  ?Vaping Use  ? Vaping Use: Never used  ?Substance Use Topics  ? Alcohol use: Yes  ?  Alcohol/week: 0.0 standard drinks  ?  Comment: ocassionally  ? Drug use: No  ? ? ? ?Allergies   ?Clarithromycin, Doxycycline, Bentyl [dicyclomine hcl], Haldol [haloperidol],  Meclizine, Morphine and related, Toradol [ketorolac tromethamine], Nitrofurantoin, and Sulfa antibiotics ? ? ?Review of Systems ?Review of Systems ?See HPI ? ?  Physical Exam ?Triage Vital Signs ?ED Triage Vitals  ?Enc Vitals Group  ?   BP 10/04/21 1154 (!) 141/89  ?   Pulse Rate 10/04/21 1154 96  ?   Resp 10/04/21 1154 16  ?   Temp 10/04/21 1154 98.4 ?F (36.9 ?C)  ?   Temp Source 10/04/21 1154 Oral  ?   SpO2 10/04/21 1154 99 %  ?   Weight --   ?   Height --   ?   Head Circumference --   ?   Peak Flow --   ?   Pain Score 10/04/21 1156 6  ?   Pain Loc --   ?   Pain Edu? --   ?   Excl. in [place]? --   ? ?No data found. ? ?Updated Vital Signs ?BP (!) 141/89 (BP Location: Right Arm)   Pulse 96   Temp 98.4 ?F (36.9 ?C) (Oral)   Resp 16   LMP 09/06/2021 (Approximate)   SpO2 99%  ?    ? ?Physical Exam ?Constitutional:   ?   General: She is not in acute distress. ?   Appearance: She is well-developed. She is obese. She is not ill-appearing.  ?HENT:  ?   Head: Normocephalic and atraumatic.  ?   Right Ear: Tympanic membrane normal.  ?   Left Ear: Tympanic membrane normal.  ?   Nose: Nose normal. No congestion.  ?   Mouth/Throat:  ?   Mouth: Mucous membranes are moist.  ?   Pharynx: No posterior oropharyngeal erythema.  ?Eyes:  ?   Conjunctiva/sclera: Conjunctivae normal.  ?   Pupils: Pupils are equal, round, and reactive to light.  ?Cardiovascular:  ?   Rate and Rhythm: Normal rate and regular rhythm.  ?   Heart sounds: Normal heart sounds.  ?Pulmonary:  ?   Effort: Pulmonary effort is normal. No respiratory distress.  ?   Breath sounds: Normal breath sounds.  ?Abdominal:  ?   General: There is no distension.  ?   Palpations: Abdomen is soft.  ?   Tenderness: There is no abdominal tenderness. There is no right CVA tenderness or left CVA tenderness.  ?Musculoskeletal:     ?   General: No tenderness or signs of injury. Normal range of motion.  ?   Cervical back: Normal range of motion and neck supple.  ?Skin: ?   General: Skin  is warm and dry.  ?Neurological:  ?   Mental Status: She is alert.  ?   Sensory: No sensory deficit.  ?   Motor: No weakness.  ?   Gait: Gait normal.  ?   Deep Tendon Reflexes: Reflexes normal.  ?Psychiatric:

## 2021-10-04 NOTE — ED Triage Notes (Addendum)
Pt states she had 1 episode of emesis around 2am. States she still feels nausea this am. She did eat a biscuit on the way here. Pt states she is also having some back and leg pain.  ?

## 2021-10-06 ENCOUNTER — Emergency Department (HOSPITAL_BASED_OUTPATIENT_CLINIC_OR_DEPARTMENT_OTHER)
Admission: EM | Admit: 2021-10-06 | Discharge: 2021-10-06 | Disposition: A | Discharge status: Home / Self Care | Payer: Medicaid Other | Attending: Emergency Medicine | Admitting: Emergency Medicine

## 2021-10-06 ENCOUNTER — Emergency Department (HOSPITAL_BASED_OUTPATIENT_CLINIC_OR_DEPARTMENT_OTHER): Payer: Medicaid Other | Admitting: Radiology

## 2021-10-06 ENCOUNTER — Ambulatory Visit
Admission: RE | Admit: 2021-10-06 | Discharge: 2021-10-06 | Disposition: A | Discharge status: Home / Self Care | Payer: Medicaid Other | Source: Ambulatory Visit | Attending: Emergency Medicine | Admitting: Emergency Medicine

## 2021-10-06 ENCOUNTER — Other Ambulatory Visit: Payer: Self-pay

## 2021-10-06 ENCOUNTER — Encounter (HOSPITAL_BASED_OUTPATIENT_CLINIC_OR_DEPARTMENT_OTHER): Payer: Self-pay | Admitting: Emergency Medicine

## 2021-10-06 VITALS — BP 152/86 | HR 71 | Temp 97.6°F | Resp 20

## 2021-10-06 DIAGNOSIS — I517 Cardiomegaly: Secondary | ICD-10-CM | POA: Insufficient documentation

## 2021-10-06 DIAGNOSIS — F17200 Nicotine dependence, unspecified, uncomplicated: Secondary | ICD-10-CM | POA: Insufficient documentation

## 2021-10-06 DIAGNOSIS — M62830 Muscle spasm of back: Secondary | ICD-10-CM | POA: Diagnosis present

## 2021-10-06 DIAGNOSIS — R319 Hematuria, unspecified: Secondary | ICD-10-CM | POA: Insufficient documentation

## 2021-10-06 DIAGNOSIS — R091 Pleurisy: Secondary | ICD-10-CM | POA: Diagnosis not present

## 2021-10-06 DIAGNOSIS — E876 Hypokalemia: Secondary | ICD-10-CM | POA: Diagnosis not present

## 2021-10-06 DIAGNOSIS — Z20822 Contact with and (suspected) exposure to covid-19: Secondary | ICD-10-CM | POA: Insufficient documentation

## 2021-10-06 DIAGNOSIS — Z79899 Other long term (current) drug therapy: Secondary | ICD-10-CM | POA: Insufficient documentation

## 2021-10-06 DIAGNOSIS — R079 Chest pain, unspecified: Secondary | ICD-10-CM | POA: Diagnosis present

## 2021-10-06 DIAGNOSIS — I10 Essential (primary) hypertension: Secondary | ICD-10-CM | POA: Insufficient documentation

## 2021-10-06 LAB — POCT URINALYSIS DIP (MANUAL ENTRY)
Bilirubin, UA: NEGATIVE
Blood, UA: NEGATIVE
Glucose, UA: NEGATIVE mg/dL
Ketones, POC UA: NEGATIVE mg/dL
Leukocytes, UA: NEGATIVE
Nitrite, UA: NEGATIVE
Protein Ur, POC: NEGATIVE mg/dL
Spec Grav, UA: 1.01 (ref 1.010–1.025)
Urobilinogen, UA: 0.2 E.U./dL
pH, UA: 7 (ref 5.0–8.0)

## 2021-10-06 LAB — BASIC METABOLIC PANEL
Anion gap: 10 (ref 5–15)
BUN: 12 mg/dL (ref 6–20)
CO2: 28 mmol/L (ref 22–32)
Calcium: 9.2 mg/dL (ref 8.9–10.3)
Chloride: 100 mmol/L (ref 98–111)
Creatinine, Ser: 0.72 mg/dL (ref 0.44–1.00)
GFR, Estimated: >60 mL/min (ref >60)
Glucose, Bld: 107 mg/dL — ABNORMAL HIGH (ref 70–99)
Potassium: 3.2 mmol/L — ABNORMAL LOW (ref 3.5–5.1)
Sodium: 138 mmol/L (ref 135–145)

## 2021-10-06 LAB — URINALYSIS, ROUTINE W REFLEX MICROSCOPIC
Bilirubin Urine: NEGATIVE
Glucose, UA: NEGATIVE mg/dL
Hgb urine dipstick: NEGATIVE
Ketones, ur: NEGATIVE mg/dL
Leukocytes,Ua: NEGATIVE
Nitrite: NEGATIVE
Protein, ur: NEGATIVE mg/dL
Specific Gravity, Urine: <1.005 — ABNORMAL LOW (ref 1.005–1.030)
pH: 6.5 (ref 5.0–8.0)

## 2021-10-06 LAB — MAGNESIUM: Magnesium: 2.2 mg/dL (ref 1.7–2.4)

## 2021-10-06 LAB — CBC
HCT: 39.8 % (ref 36.0–46.0)
Hemoglobin: 12.9 g/dL (ref 12.0–15.0)
MCH: 28.9 pg (ref 26.0–34.0)
MCHC: 32.4 g/dL (ref 30.0–36.0)
MCV: 89.2 fL (ref 80.0–100.0)
Platelets: 340 10*3/uL (ref 150–400)
RBC: 4.46 MIL/uL (ref 3.87–5.11)
RDW: 13 % (ref 11.5–15.5)
WBC: 8.3 10*3/uL (ref 4.0–10.5)
nRBC: 0 % (ref 0.0–0.2)

## 2021-10-06 LAB — RESP PANEL BY RT-PCR (FLU A&B, COVID) ARPGX2
Influenza A by PCR: NEGATIVE
Influenza B by PCR: NEGATIVE
SARS Coronavirus 2 by RT PCR: NEGATIVE

## 2021-10-06 LAB — D-DIMER, QUANTITATIVE: D-Dimer, Quant: 0.31 ug/mL-FEU (ref 0.00–0.50)

## 2021-10-06 LAB — TROPONIN I (HIGH SENSITIVITY)
Troponin I (High Sensitivity): 4 ng/L (ref <18)
Troponin I (High Sensitivity): 4 ng/L (ref <18)

## 2021-10-06 LAB — PREGNANCY, URINE: Preg Test, Ur: NEGATIVE

## 2021-10-06 MED ORDER — DICLOFENAC SODIUM 1 % EX GEL: 4.0000 g | Freq: Four times a day (QID) | CUTANEOUS | 2 refills | Status: DC

## 2021-10-06 MED ORDER — IBUPROFEN 800 MG PO TABS
800.0000 mg | ORAL_TABLET | Freq: Once | ORAL | Status: AC
  Administered 2021-10-06: 800 mg via ORAL
  Filled 2021-10-06: qty 1

## 2021-10-06 MED ORDER — POTASSIUM CHLORIDE CRYS ER 20 MEQ PO TBCR
40.0000 meq | EXTENDED_RELEASE_TABLET | Freq: Once | ORAL | Status: AC
  Administered 2021-10-06: 40 meq via ORAL
  Filled 2021-10-06: qty 2

## 2021-10-06 NOTE — Discharge Instructions (Addendum)
You were evaluated in the Emergency Department and after careful evaluation, we did not find any emergent condition requiring admission or further testing in the hospital. ? ?Your exam/testing today was overall reassuring.  Your cardiac enzymes x2 were negative indicating no evidence for heart attack.  A blood test that checks for blood clots called a D-dimer was negative indicating very low likelihood of blood clot in your lungs.  Your chest x-ray was clear with no evidence of pneumonia.  Your COVID and flu were negative.  If you develop a rash along your left chest wall that could be consistent with shingles.  That could be treated with a medication called Valtrex.  Follow-up with your PCP or the emergency department if you do develop a rash.  Your symptoms are most consistent with a pleurisy/pleuritis which can be due to a viral infection and resulted in inflammation of the lining of your lungs.  Recommend high-dose NSAIDs for the next few days, ibuprofen 800 mg every 8 hours.  Your potassium was mildly low and was replenished orally here in the ED. ? ?Please return to the Emergency Department if you experience any worsening of your condition.  Thank you for allowing [place] to be a part of your care. ? ?

## 2021-10-06 NOTE — ED Provider Notes (Signed)
?[place] EMERGENCY DEPT ?Provider Note ? ? ?CSN: [id number] ?Arrival date & time: 10/06/21  1308 ? ?  ? ?History ? ?Chief Complaint  ?Patient presents with  ? Chest Pain  ? ? ?Christina Fox is a 41 y.o. female. ? ? ?Chest Pain ? ?HPI: A 41 year old patient with a history of hypertension and obesity presents for evaluation of chest pain. Initial onset of pain was more than 6 hours ago. The patient's chest pain is sharp and is not worse with exertion. The patient's chest pain is middle- or left-sided, is not well-localized, is not described as heaviness/pressure/tightness and does radiate to the arms/jaw/neck. The patient does not complain of nausea and denies diaphoresis. The patient has smoked in the past 90 days. The patient has no history of stroke, has no history of peripheral artery disease, denies any history of treated diabetes, has no relevant family history of coronary artery disease (first degree relative at less than age 102) and has no history of hypercholesterolemia. 41 year old female presenting to the ED with sharp chest pain. She woke up this morning with sharp left sided chest discomfort, with a pleuritic component, radiating to her left shoulder blade. Yesterday, you had pain around your left knee, some in the posterior aspect, radiating to the calf. She recently started antibiotics for a UTI. Had some back pain on Monday. She is currently taking Keflex. She recently started Losartan for HTN. Had to switch from Lisinopril due to a persistent cough. No vesicular rash around her chest. No burning sensation.  No fevers or chills.  She had a viral URI last week.  She states that symptoms are worse when sitting up and better when lying flat.  No lower extremity swelling. ? ?Home Medications ?Prior to Admission medications   ?Medication Sig Start Date End Date Taking? Authorizing Provider  ?diclofenac Sodium (VOLTAREN) 1 % GEL Apply 4 g topically 4 (four) times daily. Apply to affected  areas 4 times daily as needed for pain. 10/06/21   Lynden Oxford Scales, PA-C  ?levocetirizine (XYZAL) 5 MG tablet Take 5 mg by mouth every evening.    [provider]  ?losartan (COZAAR) 25 MG tablet Take 12.5 mg by mouth daily. 10/05/21   [provider]  ?ondansetron (ZOFRAN-ODT) 4 MG disintegrating tablet Take 1 tablet (4 mg total) by mouth every 8 (eight) hours as needed for nausea or vomiting. 10/04/21   Raylene Everts, MD  ?escitalopram (LEXAPRO) 10 MG tablet Take 10 mg by mouth daily.    09/02/11  [provider]  ?   ? ?Allergies    ?Clarithromycin, Doxycycline, Bentyl [dicyclomine hcl], Haldol [haloperidol], Meclizine, Morphine and related, Toradol [ketorolac tromethamine], Nitrofurantoin, and Sulfa antibiotics   ? ?Review of Systems   ?Review of Systems  ?Cardiovascular:  Positive for chest pain.  ?All other systems reviewed and are negative. ? ?Physical Exam ?Updated Vital Signs ?BP 133/80   Pulse 72   Temp 98.1 ?F (36.7 ?C) (Oral)   Resp 19   Ht '5\' 4"'$  (1.626 m)   Wt 88 kg   LMP 09/06/2021 (Approximate) Comment: Patient states she has been spotting  SpO2 98%   BMI 33.30 kg/m?  ?Physical Exam ?Vitals and nursing note reviewed.  ?Constitutional:   ?   General: She is not in acute distress. ?   Appearance: She is well-developed.  ?HENT:  ?   Head: Normocephalic and atraumatic.  ?Eyes:  ?   Conjunctiva/sclera: Conjunctivae normal.  ?Cardiovascular:  ?  Rate and Rhythm: Normal rate and regular rhythm.  ?   Heart sounds: No murmur heard. ?Pulmonary:  ?   Effort: Pulmonary effort is normal. No respiratory distress.  ?   Breath sounds: Normal breath sounds.  ?Chest:  ?   Comments: No rash, no reproducible chest wall tenderness to palpation  ?Abdominal:  ?   Palpations: Abdomen is soft.  ?   Tenderness: There is no abdominal tenderness.  ?Musculoskeletal:     ?   General: No swelling.  ?   Cervical back: Neck supple.  ?Skin: ?   General: Skin is warm and dry.  ?   Capillary  Refill: Capillary refill takes less than 2 seconds.  ?Neurological:  ?   Mental Status: She is alert.  ?Psychiatric:     ?   Mood and Affect: Mood normal.  ? ? ?ED Results / Procedures / Treatments   ?Labs ?(all labs ordered are listed, but only abnormal results are displayed) ?Labs Reviewed  ?BASIC METABOLIC PANEL - Abnormal; Notable for the following components:  ?    Result Value  ? Potassium 3.2 (*)   ? Glucose, Bld 107 (*)   ? All other components within normal limits  ?URINALYSIS, ROUTINE W REFLEX MICROSCOPIC - Abnormal; Notable for the following components:  ? Color, Urine STRAW (*)   ? Specific Gravity, Urine <1.005 (*)   ? All other components within normal limits  ?RESP PANEL BY RT-PCR (FLU A&B, COVID) ARPGX2  ?CBC  ?PREGNANCY, URINE  ?D-DIMER, QUANTITATIVE  ?MAGNESIUM  ?TROPONIN I (HIGH SENSITIVITY)  ?TROPONIN I (HIGH SENSITIVITY)  ? ? ?EKG ?EKG Interpretation ? ?Date/Time:  Wednesday October 06 2021 13:16:17 EDT ?Ventricular Rate:  66 ?PR Interval:  128 ?QRS Duration: 92 ?QT Interval:  434 ?QTC Calculation: 734 ?R Axis:   62 ?Text Interpretation: Normal sinus rhythm Nonspecific ST abnormality Abnormal ECG When compared with ECG of 06-Oct-2021 11:28, No significant change was found Confirmed by Regan Lemming ([id number]) on 10/06/2021 3:17:24 PM ? ?Radiology ?DG Chest 2 View ? ?Result Date: 10/06/2021 ?CLINICAL DATA:  Chest pain EXAM: CHEST - 2 VIEW COMPARISON:  07/25/2021 FINDINGS: The heart size and mediastinal contours are within normal limits. Both lungs are clear. The visualized skeletal structures are unremarkable. IMPRESSION: No active cardiopulmonary disease. Electronically Signed   By: Elmer Picker M.D.   On: 10/06/2021 13:46   ? ?Procedures ?Procedures  ? ? ?Medications Ordered in ED ?Medications  ?ibuprofen (ADVIL) tablet 800 mg (800 mg Oral Given 10/06/21 1539)  ?potassium chloride SA (KLOR-CON M) CR tablet 40 mEq (40 mEq Oral Given 10/06/21 1539)  ? ? ?ED Course/ Medical Decision Making/  A&P ?Clinical Course as of 10/06/21 1741  ?Wed Oct 06, 2021  ?1601 Troponin I (High Sensitivity): 4 [JL]  ?1601 D-Dimer, Quant: 0.31 [JL]  ?1601 Potassium(!): 3.2 [JL]  ?  ?Clinical Course User Index ?[JL] Regan Lemming, MD  ? ?HEAR Score: 3                       ?Medical Decision Making ?Amount and/or Complexity of Data Reviewed ?Labs: ordered. Decision-making details documented in ED Course. ? ?Risk ?Prescription drug management. ? ? ?41 year old female presenting to the ED with sharp chest pain. She woke up this morning with sharp left sided chest discomfort, with a pleuritic component, radiating to her left shoulder blade. Yesterday, you had pain around your left knee, some in the posterior aspect, radiating to the calf. She  recently started antibiotics for a UTI. Had some back pain on Monday. She is currently taking Keflex. She recently started Losartan for HTN. Had to switch from Lisinopril due to a persistent cough. No vesicular rash around her chest. No burning sensation.  No fevers or chills.  She had a viral URI last week.  She states that symptoms are worse when sitting up and better when lying flat.  No lower extremity swelling. ? ?Patricia A Brunker is a 41 y.o. female who presented to the Emergency Department c/o chest pain. ?Past medical records have been reviewed and are notable for GERD, hypertension, gallstone disease status post cholecystectomy, anxiety ? ?Vitals and telemetry on arrival: temperature 98.3, pulse 85, RR 18, hypertensive BP 172/107, improvement to 146/91 without intervention, saturating 96% on room air.  Sinus rhythm noted on cardiac telemetry. ? ?Pertinent exam findings include: No rash, lungs clear to auscultation bilaterally, no reproducible chest wall tenderness to palpation, no lower extremity edema or JVD. ? ?Differential diagnosis includes: ACS, pneumonia, pneumothorax, pulmonary embolism,pericarditis/myocarditis, GERD, PUD, musculoskeletal. HEART score of 3, moderate risk.  Patient notgiven ASA 325 mg, not given nitroglycerin. ? ?  Well's score, low probability. D-dimer negative. Labs unremarkable.  Unlikely pneumonia, no cough, no leukocytosis, no fevers, CXR and exam without acute finding

## 2021-10-06 NOTE — ED Triage Notes (Signed)
Pt c/o left knee pain that began yesterday. She states today the pain is in her upper left back that radiates to cental region of chest. Pt c/o SOB and some dizziness. Pt does not display active s/s of respiratory distress.  ?

## 2021-10-06 NOTE — ED Provider Notes (Signed)
?Ricardo ? ? ? ?CSN: [id number] ?Arrival date & time: 10/06/21  1110 ?  ? ?HISTORY  ? ?Chief Complaint  ?Patient presents with  ? Knee Pain  ?  Upper back pain, chest discomfort when taking deep breaths - Entered by patient  ? ?HPI ?Christina Fox is a 41 y.o. female. Pt c/o left knee pain that began yesterday. She states today the pain is in her upper left back that radiates to cental region of chest. Pt c/o SOB and some dizziness. Pt does not display active s/s of respiratory distress.  Patient was seen in urgent care in [place] 2 days ago with complaints of nausea and fatigue.  Urine pregnancy test was negative.  Urine dip revealed large red blood cells, 2+ leukocytes, cloudy appearance, protein and bilirubin.  Because she was already taking Keflex prescribed at a video visit 8 days prior for presumed UTI, urine culture was not performed.  Urine dip today is completely normal.   ? ?Patient also complained of lower back pain that radiated to her right leg.  Patient was advised to finish Keflex, which she had probably already finished at that point, and prescribed Zofran for nausea and ibuprofen for pain with recommendations to apply heat to lower back.   ? ?Today, patient is also complaining of some shortness of breath and dizziness, on arrival today patient is not demonstrating any active signs and symptoms of respiratory distress.  Her blood pressure however is significantly elevated at 158/131 on arrival, repeat blood pressure in her other arm was 152/86.  Patient reports she was prescribed losartan 12.5 mg yesterday, states she did take it this morning prior to arrival.  Patient is asking to have her blood pressure rechecked to see if it is helping yet.  Patient states she used to take lisinopril which was discontinued because it made a cough all the time. ? ?Patient states she smokes "every now and then", states she did not smoke a cigarette this morning, per my observation, patient  reeks of cigarettes at this time. ? ?EMR reviewed.  Patient has a history of asthma and allergies, previously prescribed levocetirizine, Flonase and albuterol.  Patient states she is currently taking all of these medications but I do not see that any of them have recently been prescribed.  Patient also has multiple ED and urgent care visits with chief complaints varying from upper back, thoracic back pain, lower back pain, knee pain, flank pain, patient has had CT scans of her head, neck, chest, abdomen, pelvis all of which have been completely normal.  Chest x-rays have also been performed several times, all of which have been normal.  Patient did have an abnormal EKG about a week and a half ago at a [facility] urgent care for which she was sent to the emergency room.  Cardiac work-up was negative.  EKG performed today by RN prior to my seeing the patient demonstrates similar nonspecific ST wave abnormalities.  Patient also meets medical voltage criteria for LVH which in my opinion is likely secondary to her uncontrolled hypertension. ? ?The history is provided by the patient.  ? ? ?Past Medical History:  ?Diagnosis Date  ? Abdominal pain   ? "right lower quadrant pain and right lower back pain  ? Anxiety   ? Deviated septum 09/2011  ? GERD (gastroesophageal reflux disease)   ? Headache(784.0)   ? sinus  ? History of echocardiogram   ? Echo 11/17: EF 55-60, no RWMA, normal diastolic  function  ? Hypertension   ? no meds  ? Kidney stones   ? no current problem  ? Migraine   ? Nasal turbinate hypertrophy 09/2011  ? bilat.  ? Pelvic inflammatory disease   ? Urticaria   ? ?Patient Active Problem List  ? Diagnosis Date Noted  ? Upper airway cough syndrome 07/08/2020  ? Gallstone 01/07/2016  ? History of colonic polyps 11/27/2015  ? Right lower quadrant abdominal pain 11/27/2015  ? HTN (hypertension), benign 11/24/2015  ? Abdominal pain 11/16/2015  ? Headache 10/13/2015  ? Thyromegaly 11/03/2014  ? Anxiety disorder  11/03/2014  ? ?Past Surgical History:  ?Procedure Laterality Date  ? CHOLECYSTECTOMY N/A 01/15/2016  ? Procedure: LAPAROSCOPIC CHOLECYSTECTOMY WITH INTRAOPERATIVE CHOLANGIOGRAM;  Surgeon: Christene Lye, MD;  Location: [facility] ORS;  Service: General;  Laterality: N/A;  ? COLONOSCOPY  2007  ? [facility]: pedunculated 65m polyp in distal sigmoid, sessile 220mpolyp at splenic flexure, larger polyp tubulovillous adenoma and smaller polyp tubular adenoma   ? COLONOSCOPY  09-09-15  ? COLONOSCOPY WITH PROPOFOL N/A 03/01/2016  ? Procedure: COLONOSCOPY WITH PROPOFOL;  Surgeon: MaGarlan FairMD;  Location: [facility];  Service: Endoscopy;  Laterality: N/A;  ? NASAL SEPTOPLASTY W/ TURBINOPLASTY  10/04/2011  ? Procedure: NASAL SEPTOPLASTY WITH TURBINATE REDUCTION;  Surgeon: SuAscencion DikeMD;  [facility]: [facility] Service: ENT;  Laterality: Bilateral;  ? [place]  2009  ? ?OB History   ? ? Gravida  ?5  ? Para  ?4  ? Term  ?4  ? Preterm  ?   ? AB  ?1  ? Living  ?4  ?  ? ? SAB  ?1  ? IAB  ?   ? Ectopic  ?   ? Multiple  ?   ? Live Births  ?4  ?   ?  ? Obstetric Comments  ?1st Menstrual Cycle:  10  ?1st Pregnancy:  16 ?  ?  ? ?  ? ?Home Medications   ? ?Prior to Admission medications   ?Medication Sig Start Date End Date Taking? Authorizing Provider  ?levocetirizine (XYZAL) 5 MG tablet Take 5 mg by mouth every evening.    [provider]  ?losartan (COZAAR) 25 MG tablet Take 12.5 mg by mouth daily. 10/05/21   [provider]  ?ondansetron (ZOFRAN-ODT) 4 MG disintegrating tablet Take 1 tablet (4 mg total) by mouth every 8 (eight) hours as needed for nausea or vomiting. 10/04/21   NeRaylene EvertsMD  ? ? ?Family History ?Family History  ?Problem Relation Age of Onset  ? Kidney cancer Mother   ? Hypertension Mother   ? Migraines Mother   ? Multiple sclerosis Mother   ? Fibromyalgia Mother   ? Liver disease Mother   ?     NASH   ? Hypertension Father   ? Liver disease Father   ?      liver transplant; had fatty liver disease  ? Spina bifida Brother   ? Migraines Maternal Grandmother   ? Hypertension Other   ? Colon cancer Paternal Grandfather   ? Allergic rhinitis Daughter   ? Asthma Daughter   ? Allergic rhinitis Son   ? Asthma Son   ? Angioedema Neg Hx   ? Eczema Neg Hx   ? Immunodeficiency Neg Hx   ? Urticaria Neg Hx   ? ?Social History ?Social History  ? ?Tobacco Use  ? Smoking status: Every Day  ?  Packs/day: 0.25  ?  Years: 10.00  ?  Pack years: 2.50  ?  Types: Cigarettes  ? Smokeless tobacco: Never  ? Tobacco comments:  ?  2-3 cigarettes a day  ?Vaping Use  ? Vaping Use: Never used  ?Substance Use Topics  ? Alcohol use: Yes  ?  Alcohol/week: 0.0 standard drinks  ?  Comment: ocassionally  ? Drug use: No  ? ?Allergies   ?Clarithromycin, Doxycycline, Bentyl [dicyclomine hcl], Haldol [haloperidol], Meclizine, Morphine and related, Toradol [ketorolac tromethamine], Nitrofurantoin, and Sulfa antibiotics ? ?Review of Systems ?Review of Systems ?Pertinent findings noted in history of present illness.  ? ?Physical Exam ?Triage Vital Signs ?ED Triage Vitals  ?Enc Vitals Group  ?   BP 04/23/21 0827 (!) 147/82  ?   Pulse Rate 04/23/21 0827 72  ?   Resp 04/23/21 0827 18  ?   Temp 04/23/21 0827 98.3 ?F (36.8 ?C)  ?   Temp Source 04/23/21 0827 Oral  ?   SpO2 04/23/21 0827 98 %  ?   Weight --   ?   Height --   ?   Head Circumference --   ?   Peak Flow --   ?   Pain Score 04/23/21 0826 5  ?   Pain Loc --   ?   Pain Edu? --   ?   Excl. in [place]? --   ?No data found. ? ?Updated Vital Signs ?BP (!) 152/86 (BP Location: Left Arm)   Pulse 71   Temp 97.6 ?F (36.4 ?C) (Oral)   Resp 20   LMP 09/06/2021 (Approximate) Comment: Patient states she has been spotting  SpO2 97%  ? ?Physical Exam ?Vitals and nursing note reviewed.  ?Constitutional:   ?   General: She is awake. She is not in acute distress. ?   Appearance: Normal appearance. She is well-developed and well-groomed. She is morbidly obese. She is not  ill-appearing, toxic-appearing or diaphoretic.  ?HENT:  ?   Head: Normocephalic and atraumatic.  ?   Salivary Glands: Right salivary gland is not diffusely enlarged or tender. Left salivary gland is not diffusely enlarg

## 2021-10-06 NOTE — ED Notes (Signed)
Pt reports a pin level of constat pain 7 out of 7 between her shoulder blades that she describes as pressure.  ?

## 2021-10-06 NOTE — Discharge Instructions (Addendum)
For your upper back pain, lower back pain and knee pain, please begin diclofenac gel 4 times daily as needed to the affected areas.  A prescription has been sent to your pharmacy. ? ?Your urinalysis today was normal, urine culture will be performed per protocol.  If there is an abnormal finding, you will be contacted and provided with further antibiotic treatment if needed. ? ?Your EKG was abnormal today, you have significant enlargement of the left side of your heart which is due to uncontrolled hypertension.  At this time, no intervention is required however over time, if your blood pressure does not get under better control, you may have early onset of congestive heart failure.  I recommend that she consider quitting smoking altogether as this is likely contributing to the enlargement of your heart by raising your blood pressure at irregular times throughout the day. ? ?Please follow-up with your primary care provider regarding your hypertension, knee pain, allergies and asthma.  Urgent care is here for you anytime that you have an urgent medical needs that does not necessarily require emergency evaluation and treatment.  Chronic issues such as muscle pain, hypertension, allergies and mild asthma symptoms are best addressed by your regular provider. ? ?Thank you for visiting urgent care today.   ?

## 2021-10-06 NOTE — ED Triage Notes (Addendum)
Sent from UC with chest pain and pain between the shoulder blades that started this morning. Currently being treated for a UTI. Also c/o L knee pain. Reports recently started taking BP meds.  ?

## 2021-10-07 ENCOUNTER — Other Ambulatory Visit: Payer: Self-pay | Admitting: Obstetrics

## 2021-10-07 DIAGNOSIS — N946 Dysmenorrhea, unspecified: Secondary | ICD-10-CM

## 2021-10-07 LAB — URINE CULTURE: Culture: <10000 — AB

## 2021-11-02 ENCOUNTER — Emergency Department (HOSPITAL_BASED_OUTPATIENT_CLINIC_OR_DEPARTMENT_OTHER): Payer: Medicaid Other

## 2021-11-02 ENCOUNTER — Encounter (HOSPITAL_BASED_OUTPATIENT_CLINIC_OR_DEPARTMENT_OTHER): Payer: Self-pay | Admitting: Emergency Medicine

## 2021-11-02 ENCOUNTER — Other Ambulatory Visit (HOSPITAL_BASED_OUTPATIENT_CLINIC_OR_DEPARTMENT_OTHER): Payer: Self-pay

## 2021-11-02 ENCOUNTER — Emergency Department (HOSPITAL_BASED_OUTPATIENT_CLINIC_OR_DEPARTMENT_OTHER)
Admission: EM | Admit: 2021-11-02 | Discharge: 2021-11-02 | Disposition: A | Discharge status: Home / Self Care | Payer: Medicaid Other | Attending: Emergency Medicine | Admitting: Emergency Medicine

## 2021-11-02 ENCOUNTER — Other Ambulatory Visit: Payer: Self-pay

## 2021-11-02 DIAGNOSIS — M79605 Pain in left leg: Secondary | ICD-10-CM

## 2021-11-02 DIAGNOSIS — M79662 Pain in left lower leg: Secondary | ICD-10-CM | POA: Diagnosis present

## 2021-11-02 LAB — CBC
HCT: 38 % (ref 36.0–46.0)
Hemoglobin: 12.3 g/dL (ref 12.0–15.0)
MCH: 28.7 pg (ref 26.0–34.0)
MCHC: 32.4 g/dL (ref 30.0–36.0)
MCV: 88.6 fL (ref 80.0–100.0)
Platelets: 288 10*3/uL (ref 150–400)
RBC: 4.29 MIL/uL (ref 3.87–5.11)
RDW: 12.7 % (ref 11.5–15.5)
WBC: 8 10*3/uL (ref 4.0–10.5)
nRBC: 0 % (ref 0.0–0.2)

## 2021-11-02 LAB — COMPREHENSIVE METABOLIC PANEL
ALT: 37 U/L (ref 0–44)
AST: 22 U/L (ref 15–41)
Albumin: 4.1 g/dL (ref 3.5–5.0)
Alkaline Phosphatase: 54 U/L (ref 38–126)
Anion gap: 9 (ref 5–15)
BUN: 10 mg/dL (ref 6–20)
CO2: 26 mmol/L (ref 22–32)
Calcium: 8.8 mg/dL — ABNORMAL LOW (ref 8.9–10.3)
Chloride: 103 mmol/L (ref 98–111)
Creatinine, Ser: 0.6 mg/dL (ref 0.44–1.00)
GFR, Estimated: >60 mL/min (ref >60)
Glucose, Bld: 96 mg/dL (ref 70–99)
Potassium: 3.5 mmol/L (ref 3.5–5.1)
Sodium: 138 mmol/L (ref 135–145)
Total Bilirubin: 0.5 mg/dL (ref 0.3–1.2)
Total Protein: 6.9 g/dL (ref 6.5–8.1)

## 2021-11-02 LAB — HCG, SERUM, QUALITATIVE: Preg, Serum: NEGATIVE

## 2021-11-02 MED ORDER — PREDNISONE 20 MG PO TABS
ORAL_TABLET | ORAL | 0 refills | Status: DC
  Filled 2021-11-02: qty 20, 12d supply, fill #0

## 2021-11-02 MED ORDER — PREDNISONE 50 MG PO TABS
60.0000 mg | ORAL_TABLET | Freq: Once | ORAL | Status: AC
  Administered 2021-11-02: 60 mg via ORAL
  Filled 2021-11-02: qty 1

## 2021-11-02 NOTE — Discharge Instructions (Signed)
Please read and follow all provided instructions. ? ?Your diagnoses today include:  ?1. Pain of left lower extremity   ? ? ?Tests performed today include: ?Vital signs - see below for your results today ?DVT study of your leg: Did not show any blood clots or other problems ? ?Medications prescribed:  ?Prednisone - steroid medicine  ? ?It is best to take this medication in the morning to prevent sleeping problems. If you are diabetic, monitor your blood sugar closely and stop taking Prednisone if blood sugar is over 300. Take with food to prevent stomach upset.  ? ?Take any prescribed medications only as directed. ? ?Home care instructions:  ?Follow any educational materials contained in this packet ?Please rest, use ice or heat on your back for the next several days ?Do not lift, push, pull anything more than 10 pounds for the next week ? ?Follow-up instructions: ?Please follow-up with your primary care provider in the next 1 week for further evaluation of your symptoms.  ? ?Return instructions:  ?SEEK IMMEDIATE MEDICAL ATTENTION IF YOU HAVE: ?New numbness, tingling, weakness, or problem with the use of your arms or legs ?Severe back pain not relieved with medications ?Loss control of your bowels or bladder ?Increasing pain in any areas of the body (such as chest or abdominal pain) ?Shortness of breath, dizziness, or fainting.  ?Worsening nausea (feeling sick to your stomach), vomiting, fever, or sweats ?Any other emergent concerns regarding your health  ? ?Additional Information: ? ?Your vital signs today were: ?BP (!) 149/92   Pulse 66   Temp 98.6 ?F (37 ?C)   Resp 18   Ht '5\' 4"'$  (1.626 m)   Wt 88 kg   SpO2 96%   BMI 33.30 kg/m?  ?If your blood pressure (BP) was elevated above 135/85 this visit, please have this repeated by your doctor within one month. ?-------------- ? ?

## 2021-11-02 NOTE — ED Provider Notes (Signed)
?[facility] EMERGENCY DEPT ?Provider Note ? ? ?CSN: [id number] ?Arrival date & time: 11/02/21  1106 ? ?  ? ?History ? ?Chief Complaint  ?Patient presents with  ? Leg Pain  ? ? ?Christina Fox is a 41 y.o. female. ? ?Patient with history of sciatica presents to the emergency department today for evaluation of a burning sensation into her left leg.  No significant generalized leg swelling.  Pain is mainly in the posterior knee and thigh area.  This started 2 days ago.  It feels different than sciatica that she has had in the past.  She was curious as to whether symptoms could be caused by recently increasing a dose of one of her blood pressure medications and called her doctor.  She was encouraged to come to the emergency department for evaluation of blood clot.  Patient does not have a history of DVT. Patient denies risk factors for pulmonary embolism including: unilateral leg swelling, history of DVT/PE/other blood clots, use of exogenous hormones, recent immobilizations, recent surgery, recent travel (>4hr segment), malignancy, hemoptysis. Patient denies warning symptoms of back pain including: fecal incontinence, urinary retention or overflow incontinence, night sweats, waking from sleep with back pain, unexplained fevers or weight loss, h/o cancer, IVDU, recent trauma.   ? ? ? ?  ? ?Home Medications ?Prior to Admission medications   ?Medication Sig Start Date End Date Taking? Authorizing Provider  ?diclofenac Sodium (VOLTAREN) 1 % GEL Apply 4 g topically 4 (four) times daily. Apply to affected areas 4 times daily as needed for pain. 10/06/21   Lynden Oxford Scales, PA-C  ?ibuprofen (ADVIL) 800 MG tablet TAKE 1 TABLET BY MOUTH EVERY 8 HOURS AS NEEDED 10/08/21   Shelly Bombard, MD  ?levocetirizine (XYZAL) 5 MG tablet Take 5 mg by mouth every evening.    [provider]  ?losartan (COZAAR) 25 MG tablet Take 12.5 mg by mouth daily. 10/05/21   [provider]  ?ondansetron  (ZOFRAN-ODT) 4 MG disintegrating tablet Take 1 tablet (4 mg total) by mouth every 8 (eight) hours as needed for nausea or vomiting. 10/04/21   Raylene Everts, MD  ?escitalopram (LEXAPRO) 10 MG tablet Take 10 mg by mouth daily.    09/02/11  [provider]  ?   ? ?Allergies    ?Clarithromycin, Doxycycline, Bentyl [dicyclomine hcl], Haldol [haloperidol], Meclizine, Morphine and related, Toradol [ketorolac tromethamine], Nitrofurantoin, and Sulfa antibiotics   ? ?Review of Systems   ?Review of Systems ? ?Physical Exam ?Updated Vital Signs ?BP (!) 149/92   Pulse 66   Temp 98.6 ?F (37 ?C)   Resp 18   Ht '5\' 4"'$  (1.626 m)   Wt 88 kg   SpO2 96%   BMI 33.30 kg/m?  ? ?Physical Exam ?Vitals and nursing note reviewed.  ?Constitutional:   ?   Appearance: She is well-developed.  ?HENT:  ?   Head: Normocephalic and atraumatic.  ?Eyes:  ?   Conjunctiva/sclera: Conjunctivae normal.  ?Pulmonary:  ?   Effort: Pulmonary effort is normal.  ?Abdominal:  ?   Palpations: Abdomen is soft.  ?   Tenderness: There is no abdominal tenderness.  ?Musculoskeletal:     ?   General: No swelling or tenderness. Normal range of motion.  ?   Cervical back: Normal range of motion and neck supple.  ?   Right lower leg: No edema.  ?   Left lower leg: No edema.  ?   Comments: No step-off noted with palpation of  spine.   ?Skin: ?   General: Skin is warm and dry.  ?   Findings: No rash.  ?Neurological:  ?   Mental Status: She is alert.  ?   Sensory: No sensory deficit.  ?   Comments: 5/5 strength in entire lower extremities bilaterally. No sensation deficit.   ? ? ?ED Results / Procedures / Treatments   ?Labs ?(all labs ordered are listed, but only abnormal results are displayed) ?Labs Reviewed  ?COMPREHENSIVE METABOLIC PANEL - Abnormal; Notable for the following components:  ?    Result Value  ? Calcium 8.8 (*)   ? All other components within normal limits  ?CBC  ?HCG, SERUM, QUALITATIVE  ? ? ?EKG ?None ? ?Radiology ?US Venous Img Lower  Left  (DVT Study) ? ?Result Date: 11/02/2021 ?CLINICAL DATA:  Left lower extremity pain. EXAM: LEFT LOWER EXTREMITY VENOUS DOPPLER ULTRASOUND TECHNIQUE: Gray-scale sonography with graded compression, as well as color Doppler and duplex ultrasound were performed to evaluate the lower extremity deep venous systems from the level of the common femoral vein and including the common femoral, femoral, profunda femoral, popliteal and calf veins including the posterior tibial, peroneal and gastrocnemius veins when visible. The superficial great saphenous vein was also interrogated. Spectral Doppler was utilized to evaluate flow at rest and with distal augmentation maneuvers in the common femoral, femoral and popliteal veins. COMPARISON:  None Available. FINDINGS: Contralateral Common Femoral Vein: Respiratory phasicity is normal and symmetric with the symptomatic side. No evidence of thrombus. Normal compressibility. Common Femoral Vein: No evidence of thrombus. Normal compressibility, respiratory phasicity and response to augmentation. Saphenofemoral Junction: No evidence of thrombus. Normal compressibility and flow on color Doppler imaging. Profunda Femoral Vein: No evidence of thrombus. Normal compressibility and flow on color Doppler imaging. Femoral Vein: No evidence of thrombus. Normal compressibility, respiratory phasicity and response to augmentation. Popliteal Vein: No evidence of thrombus. Normal compressibility, respiratory phasicity and response to augmentation. Calf Veins: No evidence of thrombus. Normal compressibility and flow on color Doppler imaging. Superficial Great Saphenous Vein: No evidence of thrombus. Normal compressibility. Venous Reflux:  None. Other Findings: No evidence of superficial thrombophlebitis or abnormal fluid collection. IMPRESSION: No evidence of left lower extremity deep venous thrombosis. Electronically Signed   By: Aletta Edouard M.D.   On: 11/02/2021 14:46   ? ?Procedures ?Procedures   ? ? ?Medications Ordered in ED ?Medications - No data to display ? ?ED Course/ Medical Decision Making/ A&P ?  ? ?Patient seen and examined. History obtained directly from patient. Work-up including labs, imaging, EKG ordered in triage, if performed, were reviewed.   ? ?Labs/EKG: Independently reviewed and interpreted.  This included: CBC with normal white blood cell count, normal hemoglobin; CMP with normal electrolytes, mildly low calcium at 8.8; negative pregnancy. ? ?Imaging: Ordered left lower extremity DVT study. ? ?Medications/Fluids: None ordered ? ?Most recent vital signs reviewed and are as follows: ?BP (!) 149/92   Pulse 66   Temp 98.6 ?F (37 ?C)   Resp 18   Ht '5\' 4"'$  (1.626 m)   Wt 88 kg   SpO2 96%   BMI 33.30 kg/m?  ? ?Initial impression: Leg pain, rule out DVT, possibly radicular in nature.  No weakness or red flag symptoms.  No signs of cellulitis. ? ?3:41 PM Reassessment performed. Patient appears comfortable.  Exam unchanged. ? ?Imaging: DVT study negative for DVT. ? ?Reviewed pertinent lab work and imaging with patient at bedside. Questions answered.  ? ?Most current vital signs  reviewed and are as follows: ?BP (!) 149/92   Pulse 66   Temp 98.6 ?F (37 ?C)   Resp 18   Ht '5\' 4"'$  (1.626 m)   Wt 88 kg   SpO2 96%   BMI 33.30 kg/m?  ? ?Plan: Discharge to home.  ? ?Prescriptions written for: Prednisone, 12-day taper. ? ?Other home care instructions discussed: OTC NSAIDs, Tylenol.  ? ?ED return instructions discussed: Urged to return with worsening severe pain, loss of bowel or bladder control, trouble walking.  ? ?Follow-up instructions discussed: Patient encouraged to follow-up with their PCP in 1 week as planned.  ? ? ?                        ?Medical Decision Making ?Amount and/or Complexity of Data Reviewed ?Labs: ordered. ? ? ?Patient with burning sensation into the left leg.  Evaluation for DVT negative.  No signs of cellulitis or abscess.  No other neurological deficits such as  weakness.  Patient is ambulatory. No warning symptoms of back pain including: fecal incontinence, urinary retention or overflow incontinence, night sweats, waking from sleep with back pain, unexplained fevers or weight loss, h/o cancer, IVDU

## 2021-11-02 NOTE — ED Triage Notes (Signed)
Pt arrives to ED with c/o left leg pain. This pain started 2-3 days ago. The left leg pain is described as burning. She reports mild left ankle swelling.  ?

## 2021-11-12 ENCOUNTER — Other Ambulatory Visit: Payer: Self-pay

## 2021-11-12 ENCOUNTER — Emergency Department (HOSPITAL_BASED_OUTPATIENT_CLINIC_OR_DEPARTMENT_OTHER)
Admission: EM | Admit: 2021-11-12 | Discharge: 2021-11-12 | Disposition: A | Discharge status: Home / Self Care | Payer: Medicaid Other | Attending: Emergency Medicine | Admitting: Emergency Medicine

## 2021-11-12 ENCOUNTER — Encounter (HOSPITAL_BASED_OUTPATIENT_CLINIC_OR_DEPARTMENT_OTHER): Payer: Self-pay

## 2021-11-12 DIAGNOSIS — R200 Anesthesia of skin: Secondary | ICD-10-CM | POA: Insufficient documentation

## 2021-11-12 DIAGNOSIS — R202 Paresthesia of skin: Secondary | ICD-10-CM | POA: Diagnosis not present

## 2021-11-12 DIAGNOSIS — Z5321 Procedure and treatment not carried out due to patient leaving prior to being seen by health care provider: Secondary | ICD-10-CM | POA: Insufficient documentation

## 2021-11-12 LAB — BASIC METABOLIC PANEL
Anion gap: 8 (ref 5–15)
BUN: 14 mg/dL (ref 6–20)
CO2: 26 mmol/L (ref 22–32)
Calcium: 9.1 mg/dL (ref 8.9–10.3)
Chloride: 102 mmol/L (ref 98–111)
Creatinine, Ser: 0.98 mg/dL (ref 0.44–1.00)
GFR, Estimated: >60 mL/min (ref >60)
Glucose, Bld: 95 mg/dL (ref 70–99)
Potassium: 3.9 mmol/L (ref 3.5–5.1)
Sodium: 136 mmol/L (ref 135–145)

## 2021-11-12 LAB — CBC
HCT: 39.2 % (ref 36.0–46.0)
Hemoglobin: 12.8 g/dL (ref 12.0–15.0)
MCH: 28.8 pg (ref 26.0–34.0)
MCHC: 32.7 g/dL (ref 30.0–36.0)
MCV: 88.3 fL (ref 80.0–100.0)
Platelets: 287 10*3/uL (ref 150–400)
RBC: 4.44 MIL/uL (ref 3.87–5.11)
RDW: 12.5 % (ref 11.5–15.5)
WBC: 12.5 10*3/uL — ABNORMAL HIGH (ref 4.0–10.5)
nRBC: 0 % (ref 0.0–0.2)

## 2021-11-12 LAB — URINALYSIS, ROUTINE W REFLEX MICROSCOPIC
Bilirubin Urine: NEGATIVE
Glucose, UA: NEGATIVE mg/dL
Hgb urine dipstick: NEGATIVE
Ketones, ur: NEGATIVE mg/dL
Leukocytes,Ua: NEGATIVE
Nitrite: NEGATIVE
Protein, ur: NEGATIVE mg/dL
Specific Gravity, Urine: <1.005 — ABNORMAL LOW (ref 1.005–1.030)
pH: 6 (ref 5.0–8.0)

## 2021-11-12 NOTE — ED Triage Notes (Signed)
Pt also endorses blurry vision.

## 2021-11-12 NOTE — ED Triage Notes (Signed)
Pt presents to ED from home C/O L foot numbness, R foot tingling/numb, and bilateral hands tingling. Pt reports being treated for peripheral neuropathy about a week ago.

## 2021-11-23 ENCOUNTER — Inpatient Hospital Stay (HOSPITAL_COMMUNITY)
Admission: AD | Admit: 2021-11-23 | Discharge: 2021-11-23 | Disposition: A | Discharge status: Home / Self Care | Payer: Medicaid Other | Attending: Obstetrics and Gynecology | Admitting: Obstetrics and Gynecology

## 2021-11-23 ENCOUNTER — Encounter (HOSPITAL_COMMUNITY): Payer: Self-pay | Admitting: Obstetrics and Gynecology

## 2021-11-23 ENCOUNTER — Other Ambulatory Visit: Payer: Self-pay

## 2021-11-23 ENCOUNTER — Inpatient Hospital Stay (HOSPITAL_COMMUNITY): Payer: Medicaid Other

## 2021-11-23 DIAGNOSIS — B3731 Acute candidiasis of vulva and vagina: Secondary | ICD-10-CM | POA: Diagnosis not present

## 2021-11-23 DIAGNOSIS — O98811 Other maternal infectious and parasitic diseases complicating pregnancy, first trimester: Secondary | ICD-10-CM | POA: Insufficient documentation

## 2021-11-23 DIAGNOSIS — O26891 Other specified pregnancy related conditions, first trimester: Secondary | ICD-10-CM | POA: Diagnosis present

## 2021-11-23 DIAGNOSIS — Z3A01 Less than 8 weeks gestation of pregnancy: Secondary | ICD-10-CM | POA: Diagnosis not present

## 2021-11-23 DIAGNOSIS — O3680X0 Pregnancy with inconclusive fetal viability, not applicable or unspecified: Secondary | ICD-10-CM | POA: Diagnosis not present

## 2021-11-23 DIAGNOSIS — O09511 Supervision of elderly primigravida, first trimester: Secondary | ICD-10-CM | POA: Diagnosis not present

## 2021-11-23 DIAGNOSIS — O98819 Other maternal infectious and parasitic diseases complicating pregnancy, unspecified trimester: Secondary | ICD-10-CM

## 2021-11-23 LAB — CBC
HCT: 38.6 % (ref 36.0–46.0)
Hemoglobin: 12.9 g/dL (ref 12.0–15.0)
MCH: 29.6 pg (ref 26.0–34.0)
MCHC: 33.4 g/dL (ref 30.0–36.0)
MCV: 88.5 fL (ref 80.0–100.0)
Platelets: 293 10*3/uL (ref 150–400)
RBC: 4.36 MIL/uL (ref 3.87–5.11)
RDW: 12.7 % (ref 11.5–15.5)
WBC: 9.7 10*3/uL (ref 4.0–10.5)
nRBC: 0 % (ref 0.0–0.2)

## 2021-11-23 LAB — URINALYSIS, ROUTINE W REFLEX MICROSCOPIC
Bilirubin Urine: NEGATIVE
Glucose, UA: NEGATIVE mg/dL
Hgb urine dipstick: NEGATIVE
Ketones, ur: NEGATIVE mg/dL
Leukocytes,Ua: NEGATIVE
Nitrite: NEGATIVE
Protein, ur: NEGATIVE mg/dL
Specific Gravity, Urine: 1.002 — ABNORMAL LOW (ref 1.005–1.030)
pH: 6 (ref 5.0–8.0)

## 2021-11-23 LAB — HIV ANTIBODY (ROUTINE TESTING W REFLEX): HIV Screen 4th Generation wRfx: NONREACTIVE

## 2021-11-23 LAB — HCG, QUANTITATIVE, PREGNANCY: hCG, Beta Chain, Quant, S: 856 m[IU]/mL — ABNORMAL HIGH (ref <5)

## 2021-11-23 LAB — WET PREP, GENITAL
Clue Cells Wet Prep HPF POC: NONE SEEN
Sperm: NONE SEEN
Trich, Wet Prep: NONE SEEN
WBC, Wet Prep HPF POC: <10

## 2021-11-23 LAB — POCT PREGNANCY, URINE: Preg Test, Ur: POSITIVE — AB

## 2021-11-23 MED ORDER — FLUCONAZOLE 150 MG PO TABS: 150.0000 mg | ORAL_TABLET | Freq: Every day | ORAL | 0 refills | Status: DC

## 2021-11-23 NOTE — MAU Note (Signed)
Pt instructed in obtaining vag swabs which she did without difficulty.

## 2021-11-23 NOTE — MAU Note (Signed)
.  Christina Fox is a 41 y.o. at 54w6dhere in MAU reporting abdominal pain for few days and spotting couple days ago. Had blighted ovum 2 yrs ago with similar symptoms. LMP: 10/27/21 Onset of complaint: 3days Pain score: 5 Vitals:   11/23/21 1948 11/23/21 1949  BP:  (!) 144/82  Pulse:    Resp: 17   Temp: 98.4 F (36.9 C)   SpO2:       FHT:na Lab orders placed from triage: u/a and upt already done

## 2021-11-23 NOTE — Discharge Instructions (Signed)

## 2021-11-23 NOTE — MAU Provider Note (Signed)
History     CSN: [id number]  Arrival date and time: 11/23/21 2355   Event Date/Time   First Provider Initiated Contact with Patient 11/23/21 2125      Chief Complaint  Patient presents with   Abdominal Pain   HPI  Christina Fox is a 41 y.o. [id number] at 35w6dwho presents for evaluation of lower abdominal cramping. Patient reports she has been cramping for the last 3-4 days. She felt like they were period cramps but her period never came. Patient rates the pain as a 4/10 and has not tried anything for the pain. She also reports feeling very fatigued and how she did when she had a blighted ovum in the past. She denies any vaginal bleeding and discharge. Denies any constipation, diarrhea or any urinary complaints.    OB History     Gravida  6   Para  4   Term  4   Preterm      AB  1   Living  4      SAB  1   IAB      Ectopic      Multiple      Live Births  4        Obstetric Comments  1st Menstrual Cycle:  10  1st Pregnancy:  16          Past Medical History:  Diagnosis Date   Abdominal pain    "right lower quadrant pain and right lower back pain   Anxiety    Deviated septum 09/2011   GERD (gastroesophageal reflux disease)    Headache(784.0)    sinus   History of echocardiogram    Echo 11/17: EF 55-60, no RWMA, normal diastolic function   Hypertension    no meds   Kidney stones    no current problem   Migraine    Nasal turbinate hypertrophy 09/2011   bilat.   Pelvic inflammatory disease    Urticaria     Past Surgical History:  Procedure Laterality Date   CHOLECYSTECTOMY N/A 01/15/2016   Procedure: LAPAROSCOPIC CHOLECYSTECTOMY WITH INTRAOPERATIVE CHOLANGIOGRAM;  Surgeon: SChristene Lye MD;  Location: [facility];  Service: General;  Laterality: N/A;   COLONOSCOPY  2007   [facility]: pedunculated 1524mpolyp in distal sigmoid, sessile 24m224molyp at splenic flexure, larger polyp tubulovillous adenoma and smaller polyp tubular adenoma     COLONOSCOPY  09-09-15   COLONOSCOPY WITH PROPOFOL N/A 03/01/2016   Procedure: COLONOSCOPY WITH PROPOFOL;  Surgeon: MarGarlan FairD;  Location: [facility];  Service: Endoscopy;  Laterality: N/A;   NASAL SEPTOPLASTY W/ TURBINOPLASTY  10/04/2011   Procedure: NASAL SEPTOPLASTY WITH TURBINATE REDUCTION;  Surgeon: SuiAscencion DikeD;  [facility];  Laterality: Bilateral;   WISYoloTRACTION  2009    Family History  Problem Relation Age of Onset   Kidney cancer Mother    Hypertension Mother    Migraines Mother    Multiple sclerosis Mother    Fibromyalgia Mother    Liver disease Mother        NASH    Hypertension Father    Liver disease Father        liver transplant; had fatty liver disease   Spina bifida Brother    Migraines Maternal Grandmother    Hypertension Other    Colon cancer Paternal Grandfather    Allergic rhinitis Daughter    Asthma Daughter    Allergic rhinitis Son  Asthma Son    Angioedema Neg Hx    Eczema Neg Hx    Immunodeficiency Neg Hx    Urticaria Neg Hx     Social History   Tobacco Use   Smoking status: Former    Packs/day: 0.25    Years: 10.00    Pack years: 2.50    Types: Cigarettes   Smokeless tobacco: Never   Tobacco comments:    2-3 cigarettes a day  Vaping Use   Vaping Use: Never used  Substance Use Topics   Alcohol use: Yes    Alcohol/week: 0.0 standard drinks    Comment: ocassionally   Drug use: No    Allergies:  Allergies  Allergen Reactions   Clarithromycin Hives and Itching   Doxycycline Shortness Of Breath    Chest pain   Bentyl [Dicyclomine Hcl] Other (See Comments)    DIZZINESS   Haldol [Haloperidol] Other (See Comments)    Made her feel very jittery and agitated   Meclizine     Chest pain   Morphine And Related Other (See Comments)    Made her feel like she was losing her mind   Toradol [Ketorolac Tromethamine] Other (See Comments)    Made her feel very jittery and agitated    Nitrofurantoin Rash   Sulfa Antibiotics Hives and Rash    Medications Prior to Admission  Medication Sig Dispense Refill Last Dose   diclofenac Sodium (VOLTAREN) 1 % GEL Apply 4 g topically 4 (four) times daily. Apply to affected areas 4 times daily as needed for pain. 100 g 2    ibuprofen (ADVIL) 800 MG tablet TAKE 1 TABLET BY MOUTH EVERY 8 HOURS AS NEEDED 30 tablet 5    levocetirizine (XYZAL) 5 MG tablet Take 5 mg by mouth every evening.      losartan (COZAAR) 25 MG tablet Take 12.5 mg by mouth daily.      ondansetron (ZOFRAN-ODT) 4 MG disintegrating tablet Take 1 tablet (4 mg total) by mouth every 8 (eight) hours as needed for nausea or vomiting. 20 tablet 0    predniSONE (DELTASONE) 20 MG tablet 3 Tabs PO Days 1-3, then 2 tabs PO Days 4-6, then 1 tab PO Day 7-9, then Half Tab PO Day 10-12 20 tablet 0     Review of Systems  Constitutional: Negative.  Negative for fatigue and fever.  HENT: Negative.    Respiratory: Negative.  Negative for shortness of breath.   Cardiovascular: Negative.  Negative for chest pain.  Gastrointestinal:  Positive for abdominal pain. Negative for constipation, diarrhea, nausea and vomiting.  Genitourinary: Negative.  Negative for dysuria, vaginal bleeding and vaginal discharge.  Neurological: Negative.  Negative for dizziness and headaches.  Physical Exam   Blood pressure (!) 144/82, pulse 100, temperature 98.4 F (36.9 C), resp. rate 17, height '5\' 3"'$  (1.6 m), weight 90.3 kg, last menstrual period 10/27/2021, SpO2 100 %, unknown if currently breastfeeding.  Patient Vitals for the past 24 hrs:  BP Temp Pulse Resp SpO2 Height Weight  11/23/21 1949 (!) 144/82 -- -- -- -- -- --  11/23/21 1948 -- 98.4 F (36.9 C) -- 17 -- -- --  11/23/21 1946 -- -- 100 -- 100 % '5\' 3"'$  (1.6 m) 90.3 kg    Physical Exam Vitals and nursing note reviewed.  Constitutional:      General: She is not in acute distress.    Appearance: She is well-developed.  HENT:     Head:  Normocephalic.  Eyes:  Pupils: Pupils are equal, round, and reactive to light.  Cardiovascular:     Rate and Rhythm: Normal rate and regular rhythm.     Heart sounds: Normal heart sounds.  Pulmonary:     Effort: Pulmonary effort is normal. No respiratory distress.     Breath sounds: Normal breath sounds.  Abdominal:     General: Bowel sounds are normal. There is no distension.     Palpations: Abdomen is soft.     Tenderness: There is no abdominal tenderness.  Skin:    General: Skin is warm and dry.  Neurological:     Mental Status: She is alert and oriented to person, place, and time.  Psychiatric:        Mood and Affect: Mood normal.        Behavior: Behavior normal.        Thought Content: Thought content normal.        Judgment: Judgment normal.    MAU Course  Procedures  Results for orders placed or performed during the hospital encounter of 11/23/21 (from the past 24 hour(s))  Pregnancy, urine POC     Status: Abnormal   Collection Time: 11/23/21  6:48 PM  Result Value Ref Range   Preg Test, Ur POSITIVE (A) NEGATIVE  Urinalysis, Routine w reflex microscopic Urine, Clean Catch     Status: Abnormal   Collection Time: 11/23/21  6:54 PM  Result Value Ref Range   Color, Urine STRAW (A) YELLOW   APPearance CLEAR CLEAR   Specific Gravity, Urine 1.002 (L) 1.005 - 1.030   pH 6.0 5.0 - 8.0   Glucose, UA NEGATIVE NEGATIVE mg/dL   Hgb urine dipstick NEGATIVE NEGATIVE   Bilirubin Urine NEGATIVE NEGATIVE   Ketones, ur NEGATIVE NEGATIVE mg/dL   Protein, ur NEGATIVE NEGATIVE mg/dL   Nitrite NEGATIVE NEGATIVE   Leukocytes,Ua NEGATIVE NEGATIVE  hCG, quantitative, pregnancy     Status: Abnormal   Collection Time: 11/23/21  7:13 PM  Result Value Ref Range   hCG, Beta Chain, Quant, S 856 (H) <5 mIU/mL  CBC     Status: None   Collection Time: 11/23/21  7:13 PM  Result Value Ref Range   WBC 9.7 4.0 - 10.5 K/uL   RBC 4.36 3.87 - 5.11 MIL/uL   Hemoglobin 12.9 12.0 - 15.0 g/dL    HCT 38.6 36.0 - 46.0 %   MCV 88.5 80.0 - 100.0 fL   MCH 29.6 26.0 - 34.0 pg   MCHC 33.4 30.0 - 36.0 g/dL   RDW 12.7 11.5 - 15.5 %   Platelets 293 150 - 400 K/uL   nRBC 0.0 0.0 - 0.2 %  HIV Antibody (routine testing w rflx)     Status: None   Collection Time: 11/23/21  7:13 PM  Result Value Ref Range   HIV Screen 4th Generation wRfx Non Reactive Non Reactive  Wet prep, genital     Status: Abnormal   Collection Time: 11/23/21  7:53 PM   Specimen: Vaginal  Result Value Ref Range   Yeast Wet Prep HPF POC PRESENT (A) NONE SEEN   Trich, Wet Prep NONE SEEN NONE SEEN   Clue Cells Wet Prep HPF POC NONE SEEN NONE SEEN   WBC, Wet Prep HPF POC <10 <10   Sperm NONE SEEN      US OB LESS THAN 14 WEEKS WITH OB TRANSVAGINAL  Result Date: 11/23/2021 CLINICAL DATA:  [id number]. Abdominal pain and spotting. Last menstrual period 10/27/2021. Estimated due date by  last menstrual 08/03/2022. Gestational age by last menstrual period 3 weeks and 6 days. Quantitative beta HCG 856. EXAM: OBSTETRIC <14 WK [place] AND TRANSVAGINAL OB US TECHNIQUE: Both transabdominal and transvaginal ultrasound examinations were performed for complete evaluation of the gestation as well as the maternal uterus, adnexal regions, and pelvic cul-de-sac. Transvaginal technique was performed to assess early pregnancy. COMPARISON:  None Available. FINDINGS: Intrauterine gestational sac: None Yolk sac:  Not Visualized. Embryo:  Not Visualized. Cardiac Activity: Not Visualized. Maternal uterus/adnexae: Fluid noted within the endometrium. A corpus luteum cyst is noted within the left ovary. Query second corpus luteum cyst versus minimally complex cystic lesion noted within the right ovary. No adnexal masses. Other: No free fluid in the pelvis. IMPRESSION: No intrauterine pregnancy identified. Fluid noted within the endometrium. Query second corpus luteum cyst versus minimally complex cystic lesion within the right ovary. An ectopic pregnancy is not  excluded. Recommend trending of quantitative B-HCG levels and short-term follow-up US to re-assess for pregnancy. Electronically Signed   By: Iven Finn M.D.   On: 11/23/2021 21:19     MDM Labs ordered and reviewed.   UA, UPT CBC, HCG ABO/Rh- B Pos Wet prep and gc/chlamydia US OB Comp Less 14 weeks with Transvaginal   CNM independently reviewed the imaging ordered. Imaging show no visible IUP  Discussed with client the diagnosis of pregnancy of unknown anatomic location.  Three possibilities of outcome are: a healthy pregnancy that is too early to see a yolk sac to confirm the pregnancy is in the uterus, a pregnancy that is not healthy and has not developed and will not develop, and an ectopic pregnancy that is in the abdomen that cannot be identified at this time.  And ectopic pregnancy can be a life threatening situation as a pregnancy needs to be in the uterus which is a muscle and can stretch to accommodate the growth of a pregnancy.  Other structures in the pelvis and abdomen as not muscular and do not stretch with the growth of a pregnancy.  Worst case scenario is that a structure ruptures with a growing pregnancy not in the uterus and and internal hemorrhage can be a life threatening situation.  We need to follow the progression of this pregnancy carefully.  We need to check another serum pregnancy hormone level to determine if the levels are rising appropriately  and to determine the next steps that are needed for you. Patient's questions were answered.  Patient will return to MAU on Friday evening due to work schedule and no availability for appointment in office Friday  Discussion with patient about recommendation for terazol for yeast. Patient strongly desires to not do cream treatment. Risks of diflucan discussed and patient verbalized understanding but desires pill.  Assessment and Plan   1. Pregnancy of unknown anatomic location   2. [redacted] weeks gestation of pregnancy   3.  Candidiasis of vagina during pregnancy     -Discharge home in stable condition -Rx for diflucan sent to patient's pharmacy -Strict ectopic precautions discussed -Patient advised to follow-up with MAU on Friday for repeat blood work -Patient may return to MAU as needed or if her condition were to change or worsen  Wende Mott, CNM 11/23/2021, 9:37 PM

## 2021-11-24 LAB — GC/CHLAMYDIA PROBE AMP (~~LOC~~) NOT AT ARMC
Chlamydia: NEGATIVE
Comment: NEGATIVE
Comment: NORMAL
Neisseria Gonorrhea: NEGATIVE

## 2021-11-26 ENCOUNTER — Inpatient Hospital Stay (HOSPITAL_COMMUNITY)
Admission: AD | Admit: 2021-11-26 | Discharge: 2021-11-26 | Disposition: A | Discharge status: Home / Self Care | Payer: Medicaid Other | Attending: Obstetrics and Gynecology | Admitting: Obstetrics and Gynecology

## 2021-11-26 ENCOUNTER — Other Ambulatory Visit (HOSPITAL_COMMUNITY): Payer: Medicaid Other

## 2021-11-26 ENCOUNTER — Other Ambulatory Visit: Payer: Self-pay

## 2021-11-26 ENCOUNTER — Inpatient Hospital Stay (HOSPITAL_COMMUNITY): Payer: Medicaid Other

## 2021-11-26 DIAGNOSIS — O09521 Supervision of elderly multigravida, first trimester: Secondary | ICD-10-CM | POA: Diagnosis not present

## 2021-11-26 DIAGNOSIS — R109 Unspecified abdominal pain: Secondary | ICD-10-CM | POA: Diagnosis not present

## 2021-11-26 DIAGNOSIS — O26899 Other specified pregnancy related conditions, unspecified trimester: Secondary | ICD-10-CM

## 2021-11-26 DIAGNOSIS — Z3A01 Less than 8 weeks gestation of pregnancy: Secondary | ICD-10-CM | POA: Diagnosis not present

## 2021-11-26 DIAGNOSIS — O26891 Other specified pregnancy related conditions, first trimester: Secondary | ICD-10-CM | POA: Diagnosis not present

## 2021-11-26 DIAGNOSIS — R102 Pelvic and perineal pain: Secondary | ICD-10-CM | POA: Diagnosis not present

## 2021-11-26 DIAGNOSIS — Z3490 Encounter for supervision of normal pregnancy, unspecified, unspecified trimester: Secondary | ICD-10-CM

## 2021-11-26 LAB — URINALYSIS, ROUTINE W REFLEX MICROSCOPIC
Bilirubin Urine: NEGATIVE
Glucose, UA: NEGATIVE mg/dL
Hgb urine dipstick: NEGATIVE
Ketones, ur: NEGATIVE mg/dL
Leukocytes,Ua: NEGATIVE
Nitrite: NEGATIVE
Protein, ur: NEGATIVE mg/dL
Specific Gravity, Urine: 1.003 — ABNORMAL LOW (ref 1.005–1.030)
pH: 6 (ref 5.0–8.0)

## 2021-11-26 LAB — HCG, QUANTITATIVE, PREGNANCY: hCG, Beta Chain, Quant, S: 2847 m[IU]/mL — ABNORMAL HIGH (ref <5)

## 2021-11-26 NOTE — MAU Note (Signed)
.  Christina Fox is a 41 y.o. at 80w2dhere in MAU reporting: return to repeat HCG. Pt reports newly onset lower and left ABD pain and cramping that started about 1000 today that's been constant and dull. Pt has not taken any pain medication. Pt denies VB, LOF, abnormal discharge, and recent intercourse.  Onset of complaint: 1000 Pain score: 6/10 Vitals:   11/26/21 1928  BP: 137/76  Pulse: 98  Resp: 18  Temp: 97.7 F (36.5 C)  SpO2: 98%      Lab orders placed from triage:  HCG

## 2021-11-26 NOTE — MAU Provider Note (Addendum)
Subjective:   Christina Fox is a 41 y.o. [id number] at 9w2dwho presents today for FU BHCG. She was seen on 11/23/21. Results from that day show no IUP on [place] and HCG 856. She denies vaginal bleeding. She reports abdominal and pelvic pain.  Reports 6/10 lower abdominal pain, which is different than her pain 2 days ago. Her pain 2 days ago was 4/10 and this feels different to her.   Objective:  Vitals:   11/26/21 1928  BP: 137/76  Pulse: 98  Resp: 18  Temp: 97.7 F (36.5 C)  SpO2: 98%   Physical Exam  Nursing note and vitals reviewed. Constitutional: She is oriented to person, place, and time. She appears well-developed and well-nourished. No distress.  HENT:  Head: Normocephalic. EOMI. Conjunctivae clear.  Cardiovascular: Normal rate.  Respiratory: Effort normal.  GI: Soft. There is no tenderness.  Neurological: She is alert and oriented to person, place, and time. Skin: Skin is warm and dry.  Psychiatric: She has a normal mood and affect.   Results for orders placed or performed during the hospital encounter of 11/26/21 (from the past 24 hour(s))  Urinalysis, Routine w reflex microscopic     Status: Abnormal   Collection Time: 11/26/21  6:48 PM  Result Value Ref Range   Color, Urine STRAW (A) YELLOW   APPearance CLEAR CLEAR   Specific Gravity, Urine 1.003 (L) 1.005 - 1.030   pH 6.0 5.0 - 8.0   Glucose, UA NEGATIVE NEGATIVE mg/dL   Hgb urine dipstick NEGATIVE NEGATIVE   Bilirubin Urine NEGATIVE NEGATIVE   Ketones, ur NEGATIVE NEGATIVE mg/dL   Protein, ur NEGATIVE NEGATIVE mg/dL   Nitrite NEGATIVE NEGATIVE   Leukocytes,Ua NEGATIVE NEGATIVE  hCG, quantitative, pregnancy     Status: Abnormal   Collection Time: 11/26/21  7:50 PM  Result Value Ref Range   hCG, Beta Chain, Quant, S 2,847 (H) <5 mIU/mL   MDM: Repeat Hcg Repeat [place]agiven change in Pain.  Report given to Dr. AGwenlyn Perking@ [facility] NP  Assessment/Plan: Care taken over from previous provider as  above. Repeat hCG 2,847. UKorearevealed likely early gestational sac and possible early yolk sac. Corpus luteal vs complex cysts in both ovaries. Moderate to large subchorionic hemorrhage adjacent to GS.   Discussed results with patient. Likely early IUP based on [place]and hCG results. Reviewed subchorionic hemorrhage and implications. Recommended follow up [place] 7-10 days. [place] [facility]. Strict return precautions reviewed should her abdominal pain worsen or if she develops heavy bleeding. Patient voiced understanding. All questions and concerns addressed.   1. Intrauterine pregnancy - Discharge patient - [place]OB LESS THAN 14 WEEKS WITH OB TRANSVAGINAL; Future  2. Abdominal pain affecting pregnancy - Discharge patient - [place] LESS THAN 14 WEEKS WITH OB TRANSVAGINAL; Future  CVilma Meckel MD

## 2021-12-03 ENCOUNTER — Other Ambulatory Visit: Payer: Medicaid Other

## 2021-12-03 ENCOUNTER — Other Ambulatory Visit: Payer: Self-pay | Admitting: *Deleted

## 2021-12-03 DIAGNOSIS — Z3490 Encounter for supervision of normal pregnancy, unspecified, unspecified trimester: Secondary | ICD-10-CM

## 2021-12-03 NOTE — Progress Notes (Signed)
Patient here for lab only visit. Have been following bHCG. Scheduled for [place] 12/07/21. Dr. Jodi Mourning consulted for timing of next bHCG. Recommends for 12/06/21. Pt scheduled for lab 12/06/21.

## 2021-12-04 ENCOUNTER — Telehealth: Payer: Medicaid Other | Admitting: Nurse Practitioner

## 2021-12-04 ENCOUNTER — Inpatient Hospital Stay (HOSPITAL_COMMUNITY)
Admission: AD | Admit: 2021-12-04 | Discharge: 2021-12-04 | Disposition: A | Discharge status: Home / Self Care | Payer: Medicaid Other | Attending: Obstetrics and Gynecology | Admitting: Obstetrics and Gynecology

## 2021-12-04 DIAGNOSIS — Z3A01 Less than 8 weeks gestation of pregnancy: Secondary | ICD-10-CM | POA: Diagnosis not present

## 2021-12-04 DIAGNOSIS — L509 Urticaria, unspecified: Secondary | ICD-10-CM

## 2021-12-04 DIAGNOSIS — O26891 Other specified pregnancy related conditions, first trimester: Secondary | ICD-10-CM | POA: Diagnosis present

## 2021-12-04 DIAGNOSIS — R21 Rash and other nonspecific skin eruption: Secondary | ICD-10-CM | POA: Diagnosis not present

## 2021-12-04 LAB — BETA HCG QUANT (REF LAB): hCG Quant: 6098 m[IU]/mL

## 2021-12-04 MED ORDER — HYDROXYZINE PAMOATE 25 MG PO CAPS: 25.0000 mg | ORAL_CAPSULE | Freq: Three times a day (TID) | ORAL | 0 refills | Status: AC | PRN

## 2021-12-04 NOTE — Progress Notes (Signed)
Virtual Visit Consent   Christina Fox, you are scheduled for a virtual visit with Mary-Margaret Hassell Done, [place], a [facility] provider, today.     Just as with appointments in the office, your consent must be obtained to participate.  Your consent will be active for this visit and any virtual visit you may have with one of our providers in the next 365 days.     If you have a MyChart account, a copy of this consent can be sent to you electronically.  All virtual visits are billed to your insurance company just like a traditional visit in the office.    As this is a virtual visit, video technology does not allow for your provider to perform a traditional examination.  This may limit your provider's ability to fully assess your condition.  If your provider identifies any concerns that need to be evaluated in person or the need to arrange testing (such as labs, EKG, etc.), we will make arrangements to do so.     Although advances in technology are sophisticated, we cannot ensure that it will always work on either your end or our end.  If the connection with a video visit is poor, the visit may have to be switched to a telephone visit.  With either a video or telephone visit, we are not always able to ensure that we have a secure connection.     I need to obtain your verbal consent now.   Are you willing to proceed with your visit today? YES   Christina Fox has provided verbal consent on 12/04/2021 for a virtual visit (video or telephone).   Mary-Margaret Hassell Done, FNP   Date: 12/04/2021 9:36 AM   Virtual Visit via Video Note   I, Mary-Margaret Hassell Done, connected with Christina Fox ([id number], 04-24-1981) on 12/04/21 at  9:45 AM EDT by a video-enabled telemedicine application and verified that I am speaking with the correct person using two identifiers.  Location: Patient: Virtual Visit Location Patient: Home Provider: Virtual Visit Location Provider: Mobile   I discussed the  limitations of evaluation and management by telemedicine and the availability of in person appointments. The patient expressed understanding and agreed to proceed.    History of Present Illness: Christina Fox is a 41 y.o. who identifies as a female who was assigned female at birth, and is being seen today for rash.  HPI: Patient c/o itchy rash on abdomen and legs. Started last week. She has been taking OYC antihistamine. Started on her singulair yesterday with no relief. Has also tried hydrocortisone cream. She is [redacted] weeks pregnant. Is on  no new meds, no new soaps or detergents.     Review of Systems  Constitutional:  Negative for diaphoresis and weight loss.  Eyes:  Negative for blurred vision, double vision and pain.  Respiratory:  Negative for shortness of breath.   Cardiovascular:  Negative for chest pain, palpitations, orthopnea and leg swelling.  Gastrointestinal:  Negative for abdominal pain.  Skin:  Negative for rash.  Neurological:  Negative for dizziness, sensory change, loss of consciousness, weakness and headaches.  Endo/Heme/Allergies:  Negative for polydipsia. Does not bruise/bleed easily.  Psychiatric/Behavioral:  Negative for memory loss. The patient does not have insomnia.   All other systems reviewed and are negative.   Problems:  Patient Active Problem List   Diagnosis Date Noted   Upper airway cough syndrome 07/08/2020   Gallstone 01/07/2016   History of colonic polyps 11/27/2015  Right lower quadrant abdominal pain 11/27/2015   HTN (hypertension), benign 11/24/2015   Abdominal pain 11/16/2015   Headache 10/13/2015   Thyromegaly 11/03/2014   Anxiety disorder 11/03/2014    Allergies:  Allergies  Allergen Reactions   Clarithromycin Hives and Itching   Doxycycline Shortness Of Breath    Chest pain   Bentyl [Dicyclomine Hcl] Other (See Comments)    DIZZINESS   Haldol [Haloperidol] Other (See Comments)    Made her feel very jittery and agitated    Meclizine     Chest pain   Morphine And Related Other (See Comments)    Made her feel like she was losing her mind   Toradol [Ketorolac Tromethamine] Other (See Comments)    Made her feel very jittery and agitated   Nitrofurantoin Rash   Sulfa Antibiotics Hives and Rash   Medications:  Current Outpatient Medications:    diclofenac Sodium (VOLTAREN) 1 % GEL, Apply 4 g topically 4 (four) times daily. Apply to affected areas 4 times daily as needed for pain., Disp: 100 g, Rfl: 2   fluconazole (DIFLUCAN) 150 MG tablet, Take 1 tablet (150 mg total) by mouth daily., Disp: 1 tablet, Rfl: 0   levocetirizine (XYZAL) 5 MG tablet, Take 5 mg by mouth every evening., Disp: , Rfl:    losartan (COZAAR) 25 MG tablet, Take 12.5 mg by mouth daily., Disp: , Rfl:    ondansetron (ZOFRAN-ODT) 4 MG disintegrating tablet, Take 1 tablet (4 mg total) by mouth every 8 (eight) hours as needed for nausea or vomiting., Disp: 20 tablet, Rfl: 0   predniSONE (DELTASONE) 20 MG tablet, 3 Tabs PO Days 1-3, then 2 tabs PO Days 4-6, then 1 tab PO Day 7-9, then Half Tab PO Day 10-12, Disp: 20 tablet, Rfl: 0  Observations/Objective: Patient is well-developed, well-nourished in no acute distress.  Resting comfortably  at home.  Head is normocephalic, atraumatic.  No labored breathing.  Speech is clear and coherent with logical content.  Patient is alert and oriented at baseline.  Erythematous raised bumps on abdominal wall. Contusions on legs from scratching.  Assessment and Plan:  Christina Fox in today with chief complaint of Rash   1. Urticaria Benadryl OTC -  only safe thing during pregnancy Contact OB and see if there is anything else you can safely take.   Follow Up Instructions: I discussed the assessment and treatment plan with the patient. The patient was provided an opportunity to ask questions and all were answered. The patient agreed with the plan and demonstrated an understanding of the instructions.   A copy of instructions were sent to the patient via MyChart.  The patient was advised to call back or seek an in-person evaluation if the symptoms worsen or if the condition fails to improve as anticipated.  Time:  I spent 13 minutes with the patient via telehealth technology discussing the above problems/concerns.    Mary-Margaret Hassell Done, FNP

## 2021-12-04 NOTE — MAU Note (Signed)
.  Christina Fox is a 41 y.o. at 82w3dhere in MAU reporting: a rash on her leg, arms and abd that  started the beginning of the week. Has tried benadryl hydrocortisone cream no relief. Denies any new soaps lotions or detergents. Not been exposed to any outside sources. Had virtual visit today but provider was not comfortable giving her anything besides benadryl.  LMP:  Onset of complaint: 4 days ago Pain score: 0 There were no vitals filed for this visit.   FHT:n/a Lab orders placed from triage:  none

## 2021-12-04 NOTE — MAU Provider Note (Signed)
History     CSN: [id number]  Arrival date and time: 12/04/21 1220   Event Date/Time   First Provider Initiated Contact with Patient 12/04/21 1235      Chief Complaint  Patient presents with   Rash   HPI Christina Fox is a 41 y.o. [id number] at 8w3dwho presents to MAU with chief complaint of rash. This is a new problem, onset about four days ago. Patient endorses very small, itchy raised red bumps which started on her feet and legs but have spread to all over her body. She denies new soaps, detergents, or irritants. She lives in a townhouse, "never goes outside" and does not have pets.    Patient has attempted management with Benadryl BID. She is also using topical Hydrocortisone cream. Patient attended a telehealth visit this morning and was advised to continue Benadryl PRN and to speak with her OB about other medications that might be safe in pregnancy.  Patient states the itching is so intense she is giving herself bruises as she tries to scratch away the itching sensation.  She denies all pregnancy-related complaints including pain, vaginal bleeding, dysuria, fever or recent illness.  OB History     Gravida  6   Para  4   Term  4   Preterm      AB  1   Living  4      SAB  1   IAB      Ectopic      Multiple      Live Births  4        Obstetric Comments  1st Menstrual Cycle:  10  1st Pregnancy:  16          Past Medical History:  Diagnosis Date   Abdominal pain    "right lower quadrant pain and right lower back pain   Anxiety    Deviated septum 09/2011   GERD (gastroesophageal reflux disease)    Headache(784.0)    sinus   History of echocardiogram    Echo 11/17: EF 55-60, no RWMA, normal diastolic function   Hypertension    no meds   Kidney stones    no current problem   Migraine    Nasal turbinate hypertrophy 09/2011   bilat.   Pelvic inflammatory disease    Urticaria     Past Surgical History:  Procedure Laterality Date    CHOLECYSTECTOMY N/A 01/15/2016   Procedure: LAPAROSCOPIC CHOLECYSTECTOMY WITH INTRAOPERATIVE CHOLANGIOGRAM;  Surgeon: SChristene Lye MD;  Location: [facility];  Service: General;  Laterality: N/A;   COLONOSCOPY  2007   [facility]: pedunculated 190mpolyp in distal sigmoid, sessile 42m57molyp at splenic flexure, larger polyp tubulovillous adenoma and smaller polyp tubular adenoma    COLONOSCOPY  09-09-15   COLONOSCOPY WITH PROPOFOL N/A 03/01/2016   Procedure: COLONOSCOPY WITH PROPOFOL;  Surgeon: MarGarlan FairD;  Location: [facility];  Service: Endoscopy;  Laterality: N/A;   NASAL SEPTOPLASTY W/ TURBINOPLASTY  10/04/2011   Procedure: NASAL SEPTOPLASTY WITH TURBINATE REDUCTION;  Surgeon: SuiAscencion DikeD;  [facility];  Laterality: Bilateral;   WISDOM TOOTH EXTRACTION  2009    Family History  Problem Relation Age of Onset   Kidney cancer Mother    Hypertension Mother    Migraines Mother    Multiple sclerosis Mother    Fibromyalgia Mother    Liver disease Mother        NASH  Hypertension Father    Liver disease Father        liver transplant; had fatty liver disease   Spina bifida Brother    Migraines Maternal Grandmother    Hypertension Other    Colon cancer Paternal Grandfather    Allergic rhinitis Daughter    Asthma Daughter    Allergic rhinitis Son    Asthma Son    Angioedema Neg Hx    Eczema Neg Hx    Immunodeficiency Neg Hx    Urticaria Neg Hx     Social History   Tobacco Use   Smoking status: Former    Packs/day: 0.25    Years: 10.00    Total pack years: 2.50    Types: Cigarettes   Smokeless tobacco: Never   Tobacco comments:    2-3 cigarettes a day  Vaping Use   Vaping Use: Never used  Substance Use Topics   Alcohol use: Yes    Alcohol/week: 0.0 standard drinks of alcohol    Comment: ocassionally   Drug use: No    Allergies:  Allergies  Allergen Reactions   Clarithromycin Hives and Itching   Doxycycline  Shortness Of Breath    Chest pain   Bentyl [Dicyclomine Hcl] Other (See Comments)    DIZZINESS   Haldol [Haloperidol] Other (See Comments)    Made her feel very jittery and agitated   Meclizine     Chest pain   Morphine And Related Other (See Comments)    Made her feel like she was losing her mind   Toradol [Ketorolac Tromethamine] Other (See Comments)    Made her feel very jittery and agitated   Nitrofurantoin Rash   Sulfa Antibiotics Hives and Rash    No medications prior to admission.    Review of Systems  Skin:  Positive for rash.  All other systems reviewed and are negative.  Physical Exam   Blood pressure 126/77, pulse (!) 102, temperature 98.1 F (36.7 C), resp. rate 18, height '5\' 3"'$  (1.6 m), weight 90.7 kg, last menstrual period 10/27/2021, unknown if currently breastfeeding.  Physical Exam Vitals and nursing note reviewed. Exam conducted with a chaperone present.  Constitutional:      Appearance: Normal appearance. She is obese.  Cardiovascular:     Rate and Rhythm: Normal rate.  Pulmonary:     Effort: Pulmonary effort is normal.  Skin:    Capillary Refill: Capillary refill takes less than 2 seconds.     Findings: Rash present.  Neurological:     Mental Status: She is alert and oriented to person, place, and time.  Psychiatric:        Mood and Affect: Mood normal.        Behavior: Behavior normal.        Thought Content: Thought content normal.        Judgment: Judgment normal.     MAU Course  Procedures  MDM  --Discussed that skin trauma (excoriation marks and bruising) from scratching are more significant than rash itself. Suggested patient implement solutions which focus on itching to allow rash to health. Discussed Vistaril instead of Benadryl, consider alternating topic Benadryl and Hydrocortisone cream. Consider drug store oatmeal bath packets, can use as cool compress. Discussed that steroid course is typically avoided in first trimester. Do not  recommend at this time.   Patient Vitals for the past 24 hrs:  BP Temp Pulse Resp Height Weight  12/04/21 1233 126/77 98.1 F (36.7 C) (!) 102 18 '5\' 3"'$  (  1.6 m) 90.7 kg                      Meds ordered this encounter  Medications   hydrOXYzine (VISTARIL) 25 MG capsule    Sig: Take 1 capsule (25 mg total) by mouth 3 (three) times daily as needed.    Dispense:  30 capsule    Refill:  0   Assessment and Plan  --41 y.o. [id number] at [redacted]w[redacted]d --Pruritic rash --No evidence of anaphylactic reaction --No pregnancy complaints --Discharge home in stable condition  SDarlina Rumpf MDundee MSN, CNM 12/04/2021, 6:50 PM

## 2021-12-06 ENCOUNTER — Other Ambulatory Visit: Payer: Medicaid Other

## 2021-12-06 DIAGNOSIS — O3680X0 Pregnancy with inconclusive fetal viability, not applicable or unspecified: Secondary | ICD-10-CM

## 2021-12-07 ENCOUNTER — Ambulatory Visit (INDEPENDENT_AMBULATORY_CARE_PROVIDER_SITE_OTHER): Payer: Medicaid Other

## 2021-12-07 ENCOUNTER — Ambulatory Visit: Payer: Medicaid Other

## 2021-12-07 ENCOUNTER — Other Ambulatory Visit (HOSPITAL_COMMUNITY): Payer: Self-pay | Admitting: Family Medicine

## 2021-12-07 DIAGNOSIS — O09521 Supervision of elderly multigravida, first trimester: Secondary | ICD-10-CM | POA: Diagnosis not present

## 2021-12-07 DIAGNOSIS — Z3490 Encounter for supervision of normal pregnancy, unspecified, unspecified trimester: Secondary | ICD-10-CM

## 2021-12-07 DIAGNOSIS — O26899 Other specified pregnancy related conditions, unspecified trimester: Secondary | ICD-10-CM

## 2021-12-07 DIAGNOSIS — Z3A01 Less than 8 weeks gestation of pregnancy: Secondary | ICD-10-CM | POA: Diagnosis not present

## 2021-12-07 DIAGNOSIS — Z3491 Encounter for supervision of normal pregnancy, unspecified, first trimester: Secondary | ICD-10-CM

## 2021-12-07 DIAGNOSIS — R109 Unspecified abdominal pain: Secondary | ICD-10-CM

## 2021-12-07 LAB — BETA HCG QUANT (REF LAB): hCG Quant: 7110 m[IU]/mL

## 2021-12-07 NOTE — Progress Notes (Signed)
GYNECOLOGY OFFICE VISIT NOTE  History:   Christina Fox is a 41 y.o. [id number] here today for ultrasound results.  Ultrasound shows IUP now with embryo measuring 42w3dwith FHR of 84bpm.  Abdominal pain: No   Vaginal bleeding: No    OB History  Gravida Para Term Preterm AB Living  '6 4 4   1 4  '$ SAB IAB Ectopic Multiple Live Births  1       4    # Outcome Date GA Lbr Len/2nd Weight Sex Delivery Anes PTL Lv  6 Current           5 SAB 08/2019          4 Term 12/08/11    F Vag-Spont   LIV  3 Term 12/17/01    M Vag-Spont   LIV  2 Term 10/08/00    M Vag-Spont   LIV  1 Term 07/27/98    F Vag-Spont   LIV    Obstetric Comments  1st Menstrual Cycle:  10   1st Pregnancy:  16     Health Maintenance Due  Topic Date Due   COVID-19 Vaccine (1) Never done    Past Medical History:  Diagnosis Date   Abdominal pain    "right lower quadrant pain and right lower back pain   Anxiety    Deviated septum 09/2011   GERD (gastroesophageal reflux disease)    Headache(784.0)    sinus   History of echocardiogram    Echo 11/17: EF 55-60, no RWMA, normal diastolic function   Hypertension    no meds   Kidney stones    no current problem   Migraine    Nasal turbinate hypertrophy 09/2011   bilat.   Pelvic inflammatory disease    Urticaria     Past Surgical History:  Procedure Laterality Date   CHOLECYSTECTOMY N/A 01/15/2016   Procedure: LAPAROSCOPIC CHOLECYSTECTOMY WITH INTRAOPERATIVE CHOLANGIOGRAM;  Surgeon: SChristene Lye MD;  Location: [facility];  Service: General;  Laterality: N/A;   COLONOSCOPY  2007   [facility]: pedunculated 123mpolyp in distal sigmoid, sessile 42m74molyp at splenic flexure, larger polyp tubulovillous adenoma and smaller polyp tubular adenoma    COLONOSCOPY  09-09-15   COLONOSCOPY WITH PROPOFOL N/A 03/01/2016   Procedure: COLONOSCOPY WITH PROPOFOL;  Surgeon: MarGarlan FairD;  [facility];  Service: Endoscopy;  Laterality: N/A;   NASAL  SEPTOPLASTY W/ TURBINOPLASTY  10/04/2011   Procedure: NASAL SEPTOPLASTY WITH TURBINATE REDUCTION;  Surgeon: SuiAscencion DikeD;  [facility];  Laterality: Bilateral;   WISScottTRACTION  2009    The following portions of the patient's history were reviewed and updated as appropriate: allergies, current medications, past family history, past medical history, past social history, past surgical history and problem list.   Review of Systems:  Pertinent items noted in HPI and remainder of comprehensive ROS otherwise negative.  Physical Exam:  LMP 10/27/2021  CONSTITUTIONAL: Well-developed, well-nourished female in no acute distress.  HEENT:  Normocephalic, atraumatic. External right and left ear normal. No scleral icterus.  NECK: Normal range of motion, supple, no masses noted on observation SKIN: No rash noted. Not diaphoretic. No erythema. No pallor. MUSCULOSKELETAL: Normal range of motion. No edema noted. NEUROLOGIC: Alert and oriented to person, place, and time. Normal muscle tone coordination.  PSYCHIATRIC: Normal mood and affect. Normal behavior. Normal judgment and thought content. RESPIRATORY: Effort normal, no problems with respiration noted  Assessment and Plan:  Viable Pregnancy - Z34.90  Reviewed results with patient that show a viable intrauterine pregnancy at 5 weeks of gestation. Recommended she contact providers to start prenatal care, and she was given a list of options in her AVS. Prescription sent for prenatal vitamins. Reviewed ED precautions including vaginal bleeding like a period or heavier, severe abdominal pain, and fever. All questions answered.   Discussed bradycardia can be normal in early IUP and recommendation for repeat ultrasound in 2 weeks to confirm continuing development of IUP. Patient had questions about TAB and CNM provided counseling regarding options  Please refer to After Visit Summary for other counseling  recommendations.   Patient to follow up on 6/27 for repeat ultrasound.  Total face-to-face time with patient: 30 minutes.  Over 50% of encounter was spent on counseling and coordination of care.   Wende Mott, [place] for [employer], [place]

## 2021-12-07 NOTE — Patient Instructions (Signed)

## 2021-12-14 ENCOUNTER — Encounter (HOSPITAL_COMMUNITY): Payer: Self-pay | Admitting: Obstetrics and Gynecology

## 2021-12-14 ENCOUNTER — Inpatient Hospital Stay (HOSPITAL_COMMUNITY)
Admission: AD | Admit: 2021-12-14 | Discharge: 2021-12-14 | Disposition: A | Discharge status: Home / Self Care | Payer: Medicaid Other | Attending: Obstetrics | Admitting: Obstetrics

## 2021-12-14 ENCOUNTER — Other Ambulatory Visit: Payer: Self-pay

## 2021-12-14 DIAGNOSIS — Z8759 Personal history of other complications of pregnancy, childbirth and the puerperium: Secondary | ICD-10-CM

## 2021-12-14 DIAGNOSIS — M545 Low back pain, unspecified: Secondary | ICD-10-CM | POA: Diagnosis not present

## 2021-12-14 DIAGNOSIS — N939 Abnormal uterine and vaginal bleeding, unspecified: Secondary | ICD-10-CM | POA: Insufficient documentation

## 2021-12-14 DIAGNOSIS — O0489 (Induced) termination of pregnancy with other complications: Secondary | ICD-10-CM | POA: Insufficient documentation

## 2021-12-14 DIAGNOSIS — E876 Hypokalemia: Secondary | ICD-10-CM

## 2021-12-14 LAB — BASIC METABOLIC PANEL
Anion gap: 8 (ref 5–15)
BUN: 8 mg/dL (ref 6–20)
CO2: 26 mmol/L (ref 22–32)
Calcium: 8.5 mg/dL — ABNORMAL LOW (ref 8.9–10.3)
Chloride: 102 mmol/L (ref 98–111)
Creatinine, Ser: 0.88 mg/dL (ref 0.44–1.00)
GFR, Estimated: >60 mL/min (ref >60)
Glucose, Bld: 119 mg/dL — ABNORMAL HIGH (ref 70–99)
Potassium: 2.8 mmol/L — ABNORMAL LOW (ref 3.5–5.1)
Sodium: 136 mmol/L (ref 135–145)

## 2021-12-14 LAB — CBC
HCT: 35.8 % — ABNORMAL LOW (ref 36.0–46.0)
Hemoglobin: 12.2 g/dL (ref 12.0–15.0)
MCH: 30 pg (ref 26.0–34.0)
MCHC: 34.1 g/dL (ref 30.0–36.0)
MCV: 88.2 fL (ref 80.0–100.0)
Platelets: 305 10*3/uL (ref 150–400)
RBC: 4.06 MIL/uL (ref 3.87–5.11)
RDW: 12.8 % (ref 11.5–15.5)
WBC: 11 10*3/uL — ABNORMAL HIGH (ref 4.0–10.5)
nRBC: 0 % (ref 0.0–0.2)

## 2021-12-14 LAB — HCG, QUANTITATIVE, PREGNANCY: hCG, Beta Chain, Quant, S: 1273 m[IU]/mL — ABNORMAL HIGH (ref <5)

## 2021-12-14 MED ORDER — POTASSIUM CHLORIDE CRYS ER 20 MEQ PO TBCR: 20.0000 meq | EXTENDED_RELEASE_TABLET | Freq: Two times a day (BID) | ORAL | 0 refills | Status: AC

## 2021-12-14 NOTE — MAU Provider Note (Signed)
History     CSN: [id number]  Arrival date and time: 12/14/21 1306   Event Date/Time   First Provider Initiated Contact with Patient 12/14/21 1451      Chief Complaint  Patient presents with   Back Pain   Vaginal Bleeding   HPI Christina Fox is a 41 y.o. F7P1025. She presents to MAU for evaluation of vaginal bleeding and back pain 2/2 elective termination in [place] last weekend. Patient reports multiple pad changes. Pads are heavy but not saturated when changed. She reports seeing clots "like a flat golf ball".  Patient's low back pain is 8-9/10. She is managing with 1,000 mg Tylenol and 800 mg Motrin as advised by her termination care team. Patient reports history of Mife/Miso termination following diagnoses of a blighted ovum and states she never bled this heavily. She denies dizziness, weakness, syncope.  OB History     Gravida  6   Para  4   Term  4   Preterm      AB  1   Living  4      SAB  1   IAB      Ectopic      Multiple      Live Births  4        Obstetric Comments  1st Menstrual Cycle:  10  1st Pregnancy:  16          Past Medical History:  Diagnosis Date   Abdominal pain    "right lower quadrant pain and right lower back pain   Anxiety    Deviated septum 09/2011   GERD (gastroesophageal reflux disease)    Headache(784.0)    sinus   History of echocardiogram    Echo 11/17: EF 55-60, no RWMA, normal diastolic function   Hypertension    no meds   Kidney stones    no current problem   Migraine    Nasal turbinate hypertrophy 09/2011   bilat.   Pelvic inflammatory disease    Urticaria     Past Surgical History:  Procedure Laterality Date   CHOLECYSTECTOMY N/A 01/15/2016   Procedure: LAPAROSCOPIC CHOLECYSTECTOMY WITH INTRAOPERATIVE CHOLANGIOGRAM;  Surgeon: Christene Lye, MD;  Location: [facility];  Service: General;  Laterality: N/A;   COLONOSCOPY  2007   [facility]: pedunculated 39m polyp in distal sigmoid, sessile 274m polyp at splenic flexure, larger polyp tubulovillous adenoma and smaller polyp tubular adenoma    COLONOSCOPY  09-09-15   COLONOSCOPY WITH PROPOFOL N/A 03/01/2016   Procedure: COLONOSCOPY WITH PROPOFOL;  Surgeon: MaGarlan FairMD;  [facility];  Service: Endoscopy;  Laterality: N/A;   NASAL SEPTOPLASTY W/ TURBINOPLASTY  10/04/2011   Procedure: NASAL SEPTOPLASTY WITH TURBINATE REDUCTION;  Surgeon: SuAscencion DikeMD;  [facility];  Laterality: Bilateral;   WIClaycomoXTRACTION  2009    Family History  Problem Relation Age of Onset   Kidney cancer Mother    Hypertension Mother    Migraines Mother    Multiple sclerosis Mother    Fibromyalgia Mother    Liver disease Mother        NASH    Hypertension Father    Liver disease Father        liver transplant; had fatty liver disease   Spina bifida Brother    Migraines Maternal Grandmother    Hypertension Other    Colon cancer Paternal Grandfather    Allergic rhinitis Daughter  Asthma Daughter    Allergic rhinitis Son    Asthma Son    Angioedema Neg Hx    Eczema Neg Hx    Immunodeficiency Neg Hx    Urticaria Neg Hx     Social History   Tobacco Use   Smoking status: Former    Packs/day: 0.25    Years: 10.00    Total pack years: 2.50    Types: Cigarettes   Smokeless tobacco: Never   Tobacco comments:    2-3 cigarettes a day  Vaping Use   Vaping Use: Never used  Substance Use Topics   Alcohol use: Yes    Alcohol/week: 0.0 standard drinks of alcohol    Comment: ocassionally   Drug use: No    Allergies:  Allergies  Allergen Reactions   Clarithromycin Hives and Itching   Doxycycline Shortness Of Breath    Chest pain   Bentyl [Dicyclomine Hcl] Other (See Comments)    DIZZINESS   Haldol [Haloperidol] Other (See Comments)    Made her feel very jittery and agitated   Meclizine     Chest pain   Morphine And Related Other (See Comments)    Made her feel like she was  losing her mind   Toradol [Ketorolac Tromethamine] Other (See Comments)    Made her feel very jittery and agitated   Nitrofurantoin Rash   Sulfa Antibiotics Hives and Rash    Medications Prior to Admission  Medication Sig Dispense Refill Last Dose   diclofenac Sodium (VOLTAREN) 1 % GEL Apply 4 g topically 4 (four) times daily. Apply to affected areas 4 times daily as needed for pain. 100 g 2    hydrOXYzine (VISTARIL) 25 MG capsule Take 1 capsule (25 mg total) by mouth 3 (three) times daily as needed. 30 capsule 0    levocetirizine (XYZAL) 5 MG tablet Take 5 mg by mouth every evening.      losartan (COZAAR) 25 MG tablet Take 12.5 mg by mouth daily.      ondansetron (ZOFRAN-ODT) 4 MG disintegrating tablet Take 1 tablet (4 mg total) by mouth every 8 (eight) hours as needed for nausea or vomiting. 20 tablet 0     Review of Systems  Genitourinary:  Positive for vaginal bleeding.  Musculoskeletal:  Positive for back pain.  All other systems reviewed and are negative.  Physical Exam   Blood pressure 126/72, pulse (!) 112, temperature 98.8 F (37.1 C), temperature source Oral, resp. rate 18, height '5\' 3"'$  (1.6 m), weight 89 kg, last menstrual period 10/27/2021, SpO2 97 %, unknown if currently breastfeeding.  Physical Exam Vitals and nursing note reviewed. Exam conducted with a chaperone present.  Constitutional:      Appearance: Normal appearance.  Cardiovascular:     Rate and Rhythm: Normal rate and regular rhythm.     Pulses: Normal pulses.     Heart sounds: Normal heart sounds.  Pulmonary:     Effort: Pulmonary effort is normal.     Breath sounds: Normal breath sounds.  Abdominal:     General: Abdomen is flat.  Genitourinary:    Comments: Pelvic exam: External genitalia normal, vaginal walls pink and well rugated, cervix visually closed, no lesions noted. Small dark red oozy blood coming from cervical os.   Skin:    Capillary Refill: Capillary refill takes less than 2 seconds.   Neurological:     Mental Status: She is alert and oriented to person, place, and time.  Psychiatric:  Mood and Affect: Mood normal.        Behavior: Behavior normal.        Thought Content: Thought content normal.        Judgment: Judgment normal.     MAU Course  Procedures  MDM --Patient is well-appearing and without signs of worsening acuity compared to previous MAU visits --Quant hcg 7110 on 12/06/2021, now 1,273. Reassurance provided. Current bleeding is within expectation of normal s/p elective termination --Patient previously received care at [facility]. Verbalizes preference for well woman/follow-up at [facility] vs office in [place]  Patient Vitals for the past 24 hrs:  BP Temp Temp src Pulse Resp SpO2 Height Weight  12/14/21 1507 117/75 97.6 F (36.4 C) Oral 96 12 -- -- --  12/14/21 1450 -- -- -- -- -- 97 % -- --  12/14/21 1446 131/73 -- -- 90 -- -- -- --  12/14/21 1445 -- -- -- -- -- 96 % -- --  12/14/21 1440 -- -- -- -- -- 97 % -- --  12/14/21 1435 -- -- -- -- -- 97 % -- --  12/14/21 1434 125/68 -- -- 94 -- -- -- --  12/14/21 1430 -- -- -- -- -- 96 % -- --  12/14/21 1425 -- -- -- -- -- 95 % -- --  12/14/21 1420 -- -- -- -- -- 96 % -- --  12/14/21 1416 128/72 -- -- 96 -- -- -- --  12/14/21 1415 -- -- -- -- -- 97 % -- --  12/14/21 1410 -- -- -- -- -- 95 % -- --  12/14/21 1405 -- -- -- -- -- 95 % -- --  12/14/21 1401 133/85 -- -- (!) 101 -- -- -- --  12/14/21 1400 -- -- -- -- -- 96 % -- --  12/14/21 1357 134/78 -- -- 98 -- -- -- --  12/14/21 1355 -- -- -- -- -- 97 % -- --  12/14/21 1350 117/66 -- -- 96 -- 98 % -- --  12/14/21 1324 126/72 98.8 F (37.1 C) Oral (!) 112 18 97 % -- --  12/14/21 1319 -- -- -- -- -- -- '5\' 3"'$  (1.6 m) 89 kg   Results for orders placed or performed during the hospital encounter of 12/14/21 (from the past 24 hour(s))  CBC     Status: Abnormal   Collection Time: 12/14/21  1:40 PM  Result Value Ref Range   WBC 11.0 (H) 4.0 - 10.5 K/uL   RBC  4.06 3.87 - 5.11 MIL/uL   Hemoglobin 12.2 12.0 - 15.0 g/dL   HCT 35.8 (L) 36.0 - 46.0 %   MCV 88.2 80.0 - 100.0 fL   MCH 30.0 26.0 - 34.0 pg   MCHC 34.1 30.0 - 36.0 g/dL   RDW 12.8 11.5 - 15.5 %   Platelets 305 150 - 400 K/uL   nRBC 0.0 0.0 - 0.2 %  hCG, quantitative, pregnancy     Status: Abnormal   Collection Time: 12/14/21  1:40 PM  Result Value Ref Range   hCG, Beta Chain, Quant, S 1,273 (H) <5 mIU/mL  Basic metabolic panel     Status: Abnormal   Collection Time: 12/14/21  1:40 PM  Result Value Ref Range   Sodium 136 135 - 145 mmol/L   Potassium 2.8 (L) 3.5 - 5.1 mmol/L   Chloride 102 98 - 111 mmol/L   CO2 26 22 - 32 mmol/L   Glucose, Bld 119 (H) 70 - 99 mg/dL   BUN 8 6 -  20 mg/dL   Creatinine, Ser 0.88 0.44 - 1.00 mg/dL   Calcium 8.5 (L) 8.9 - 10.3 mg/dL   GFR, Estimated >60 >60 mL/min   Anion gap 8 5 - 15   Assessment and Plan  --41 y.o. [id number] s/p elective termination --Hemodynamically stable, VSS --Hgb 12.2, scant to small bleeding WNL --B POS --Discharge home in stable condition  Darlina Rumpf, MSA, MSN, CNM 12/14/2021, 7:14 PM

## 2021-12-14 NOTE — MAU Note (Signed)
Christina Fox is a 41 y.o. at 33w6dhere in MAU reporting: had termination over the weekend, states she took pills on Sunday. Felt like she has been bleeding to much. States she is changing pads several times an hour, not fully saturated but very heavy. Is feeling dizzy. Having some back pain.   Onset of complaint: ongoing  Pain score: 9/10  Vitals:   12/14/21 1324  BP: 126/72  Pulse: (!) 112  Resp: 18  Temp: 98.8 F (37.1 C)  SpO2: 97%     Lab orders placed from triage:  none

## 2021-12-21 ENCOUNTER — Other Ambulatory Visit: Payer: Self-pay

## 2021-12-21 DIAGNOSIS — O3680X0 Pregnancy with inconclusive fetal viability, not applicable or unspecified: Secondary | ICD-10-CM

## 2021-12-29 ENCOUNTER — Ambulatory Visit: Payer: Medicaid Other | Admitting: Obstetrics and Gynecology

## 2021-12-29 ENCOUNTER — Encounter: Payer: Self-pay | Admitting: Obstetrics and Gynecology

## 2021-12-29 VITALS — BP 130/84 | HR 76 | Ht 63.0 in | Wt 201.6 lb

## 2021-12-29 DIAGNOSIS — Z8742 Personal history of other diseases of the female genital tract: Secondary | ICD-10-CM | POA: Diagnosis not present

## 2021-12-29 DIAGNOSIS — E876 Hypokalemia: Secondary | ICD-10-CM | POA: Diagnosis not present

## 2021-12-29 NOTE — Progress Notes (Unsigned)
Obstetrics and Gynecology Visit Return Patient Evaluation  Appointment Date: 12/29/2021  Primary Care Provider: Sherald Hess  OBGYN Clinic: [facility]  Chief Complaint: abortion f/u  History of Present Illness:  Christina Fox is a 41 y.o. s/p medical elective AB. Patient went to a clinic in [place] about two days before presenting to the MAU for heavy bleeding. She was diagnosed on 6/13 with a fetal bradycardia and EDC of 2/8. 6/20 mau hcg was 1273 and she was told VB was normal; no u/s done. She states VB was only heavy back then and none since.  Currently, she's noted some right sided low pelvic discomfort in the past day or so, no sexual intercourse since everything that has happened and no period yet.   Review of Systems:  as noted in the History of Present Illness.  Patient Active Problem List   Diagnosis Date Noted   Upper airway cough syndrome 07/08/2020   Gallstone 01/07/2016   History of colonic polyps 11/27/2015   Right lower quadrant abdominal pain 11/27/2015   HTN (hypertension), benign 11/24/2015   Abdominal pain 11/16/2015   Headache 10/13/2015   Thyromegaly 11/03/2014   Anxiety disorder 11/03/2014   Medications:  [name] had no medications administered during this visit. Current Outpatient Medications  Medication Sig Dispense Refill   levocetirizine (XYZAL ALLERGY 24HR) 5 MG tablet      losartan (COZAAR) 50 MG tablet Take 50 mg by mouth daily.     montelukast (SINGULAIR) 10 MG tablet Take 10 mg by mouth daily.     omeprazole (PRILOSEC) 40 MG capsule Take by mouth.     diclofenac Sodium (VOLTAREN) 1 % GEL Apply 4 g topically 4 (four) times daily. Apply to affected areas 4 times daily as needed for pain. 100 g 2   hydrOXYzine (VISTARIL) 25 MG capsule Take 1 capsule (25 mg total) by mouth 3 (three) times daily as needed. (Patient not taking: Reported on 12/29/2021) 30 capsule 0   levocetirizine (XYZAL) 5 MG tablet Take 5 mg by mouth every evening.      losartan (COZAAR) 25 MG tablet Take 12.5 mg by mouth daily.     ondansetron (ZOFRAN-ODT) 4 MG disintegrating tablet Take 1 tablet (4 mg total) by mouth every 8 (eight) hours as needed for nausea or vomiting. 20 tablet 0   potassium chloride SA (KLOR-CON M) 20 MEQ tablet Take 1 tablet (20 mEq total) by mouth 2 (two) times daily for 5 days. 10 tablet 0   No current facility-administered medications for this visit.    Allergies: is allergic to clarithromycin, doxycycline, bentyl [dicyclomine hcl], haldol [haloperidol], meclizine, morphine and related, toradol [ketorolac tromethamine], nitrofurantoin, and sulfa antibiotics.  Physical Exam:  BP 130/84   Pulse 76   Ht '5\' 3"'$  (1.6 m)   Wt 201 lb 9.6 oz (91.4 kg)   LMP 10/27/2021   Breastfeeding Unknown   BMI 35.71 kg/m  Body mass index is 35.71 kg/m. General appearance: Well nourished, well developed female in no acute distress.  Abdomen: diffusely non tender to palpation, non distended Neuro/Psych:  Normal mood and affect.    Assessment: pt stable  Plan:  1. Hypokalemia - Comprehensive metabolic panel  2. History of vaginal bleeding - CBC  3. Follow-up visit after therapeutic abortion I told her that her s/s could be her period restarting as they were qmonth, regular before her most recent pregnancy. She is not on birth control but is interested in being on OCPs. I  told her about Slynd but I want to make sure she has a period first before doing anything for birth control. F/u labs and will send in slynd if hcg looks appropriate for recent EAB and I told her she can start the slynd if period starts.    RTC: PRN  Durene Romans MD Attending Center for [facility])

## 2021-12-29 NOTE — Patient Instructions (Signed)
Slynd  [email or web address]

## 2021-12-29 NOTE — Progress Notes (Unsigned)
Patient presents for SAB follow up. Patient states that she has some intermittent pain on her right side that started last night.. Patient denies having any bleeding at this time. Bleeding stopped around 6/21-22.

## 2021-12-30 ENCOUNTER — Encounter: Payer: Self-pay | Admitting: Obstetrics and Gynecology

## 2021-12-30 DIAGNOSIS — R7401 Elevation of levels of liver transaminase levels: Secondary | ICD-10-CM | POA: Insufficient documentation

## 2021-12-30 LAB — COMPREHENSIVE METABOLIC PANEL
ALT: 33 IU/L — ABNORMAL HIGH (ref 0–32)
AST: 20 IU/L (ref 0–40)
Albumin/Globulin Ratio: 1.8 (ref 1.2–2.2)
Albumin: 4.4 g/dL (ref 3.8–4.8)
Alkaline Phosphatase: 75 IU/L (ref 44–121)
BUN/Creatinine Ratio: 12 (ref 9–23)
BUN: 10 mg/dL (ref 6–24)
Bilirubin Total: 0.4 mg/dL (ref 0.0–1.2)
CO2: 24 mmol/L (ref 20–29)
Calcium: 9.3 mg/dL (ref 8.7–10.2)
Chloride: 100 mmol/L (ref 96–106)
Creatinine, Ser: 0.85 mg/dL (ref 0.57–1.00)
Globulin, Total: 2.5 g/dL (ref 1.5–4.5)
Glucose: 100 mg/dL — ABNORMAL HIGH (ref 70–99)
Potassium: 3.8 mmol/L (ref 3.5–5.2)
Sodium: 140 mmol/L (ref 134–144)
Total Protein: 6.9 g/dL (ref 6.0–8.5)
eGFR: 88 mL/min/{1.73_m2} (ref >59)

## 2021-12-30 LAB — CBC
Hematocrit: 35.1 % (ref 34.0–46.6)
Hemoglobin: 11.5 g/dL (ref 11.1–15.9)
MCH: 28.8 pg (ref 26.6–33.0)
MCHC: 32.8 g/dL (ref 31.5–35.7)
MCV: 88 fL (ref 79–97)
Platelets: 408 10*3/uL (ref 150–450)
RBC: 3.99 x10E6/uL (ref 3.77–5.28)
RDW: 12.5 % (ref 11.7–15.4)
WBC: 9.1 10*3/uL (ref 3.4–10.8)

## 2021-12-30 LAB — BETA HCG QUANT (REF LAB): hCG Quant: 4 m[IU]/mL

## 2021-12-30 MED ORDER — SLYND 4 MG PO TABS: 1.0000 | ORAL_TABLET | Freq: Every day | ORAL | 3 refills | Status: DC

## 2022-01-04 LAB — HM MAMMOGRAPHY: HM Mammogram: NORMAL (ref 0–4)

## 2022-01-07 ENCOUNTER — Telehealth: Payer: Medicaid Other | Admitting: Family Medicine

## 2022-01-07 DIAGNOSIS — B3731 Acute candidiasis of vulva and vagina: Secondary | ICD-10-CM

## 2022-01-08 ENCOUNTER — Other Ambulatory Visit: Payer: Self-pay | Admitting: Physician Assistant

## 2022-01-08 MED ORDER — FLUCONAZOLE 150 MG PO TABS: 150.0000 mg | ORAL_TABLET | Freq: Once | ORAL | 0 refills | Status: AC

## 2022-01-08 NOTE — Progress Notes (Signed)

## 2022-01-20 ENCOUNTER — Encounter: Payer: Self-pay | Admitting: Obstetrics and Gynecology

## 2022-02-05 ENCOUNTER — Ambulatory Visit
Admission: RE | Admit: 2022-02-05 | Discharge: 2022-02-05 | Disposition: A | Discharge status: Home / Self Care | Payer: Medicaid Other | Source: Ambulatory Visit | Attending: Family Medicine | Admitting: Family Medicine

## 2022-02-05 ENCOUNTER — Telehealth: Payer: Medicaid Other | Admitting: Nurse Practitioner

## 2022-02-05 VITALS — BP 129/82 | HR 77 | Temp 98.3°F | Resp 18

## 2022-02-05 DIAGNOSIS — T148XXA Other injury of unspecified body region, initial encounter: Secondary | ICD-10-CM

## 2022-02-05 NOTE — ED Triage Notes (Signed)
The pt c/o bruise to rt upper leg, from unknown cause. She states the area is painful when touched.   Appeared: this morning

## 2022-02-05 NOTE — Progress Notes (Signed)
I have spent 5 minutes in review of e-visit questionnaire, review and updating patient chart, medical decision making and response to patient.  ° °Kalii Chesmore W Shawne Bulow, NP ° °  °

## 2022-02-05 NOTE — ED Provider Notes (Signed)
[facility]    CSN: [id number] Arrival date & time: 02/05/22  1446    HISTORY   Chief Complaint  Patient presents with   bruise   HPI Christina Fox is a pleasant, 41 y.o. female who presents to urgent care today. HPI Past Medical History:  Diagnosis Date   Abdominal pain    "right lower quadrant pain and right lower back pain   Anxiety    Deviated septum 09/2011   GERD (gastroesophageal reflux disease)    Headache(784.0)    sinus   History of echocardiogram    Echo 11/17: EF 55-60, no RWMA, normal diastolic function   Hypertension    no meds   Kidney stones    no current problem   Migraine    Nasal turbinate hypertrophy 09/2011   bilat.   Pelvic inflammatory disease    Urticaria    Patient Active Problem List   Diagnosis Date Noted   Elevated ALT measurement 12/30/2021   Upper airway cough syndrome 07/08/2020   Gallstone 01/07/2016   History of colonic polyps 11/27/2015   Right lower quadrant abdominal pain 11/27/2015   HTN (hypertension), benign 11/24/2015   Abdominal pain 11/16/2015   Headache 10/13/2015   Thyromegaly 11/03/2014   Anxiety disorder 11/03/2014   Past Surgical History:  Procedure Laterality Date   CHOLECYSTECTOMY N/A 01/15/2016   Procedure: LAPAROSCOPIC CHOLECYSTECTOMY WITH INTRAOPERATIVE CHOLANGIOGRAM;  Surgeon: Christene Lye, MD;  Location: [facility];  Service: General;  Laterality: N/A;   COLONOSCOPY  2007   [facility]: pedunculated 44m polyp in distal sigmoid, sessile 267mpolyp at splenic flexure, larger polyp tubulovillous adenoma and smaller polyp tubular adenoma    COLONOSCOPY  09-09-15   COLONOSCOPY WITH PROPOFOL N/A 03/01/2016   Procedure: COLONOSCOPY WITH PROPOFOL;  Surgeon: MaGarlan FairMD;  Location: [facility];  Service: Endoscopy;  Laterality: N/A;   NASAL SEPTOPLASTY W/ TURBINOPLASTY  10/04/2011   Procedure: NASAL SEPTOPLASTY WITH TURBINATE REDUCTION;  Surgeon: SuAscencion DikeMD;  [facility]: [facility] Service: ENT;  Laterality: Bilateral;   WISDOM TOOTH EXTRACTION  2009   OB History     Gravida  6   Para  4   Term  4   Preterm      AB  1   Living  4      SAB  1   IAB      Ectopic      Multiple      Live Births  4        Obstetric Comments  1st Menstrual Cycle:  10  1st Pregnancy:  16         Home Medications    Prior to Admission medications   Medication Sig Start Date End Date Taking? Authorizing Provider  diclofenac Sodium (VOLTAREN) 1 % GEL Apply 4 g topically 4 (four) times daily. Apply to affected areas 4 times daily as needed for pain. 10/06/21   MoLynden Oxfordcales, PA-C  Drospirenone (SLYND) 4 MG TABS Take 1 tablet by mouth daily. 12/30/21   PiAletha HalimMD  levocetirizine (XHarlow OhmsLLERGY 24HR) 5 MG tablet  10/04/20   [provider]  levocetirizine (XYZAL) 5 MG tablet Take 5 mg by mouth every evening.    [provider]  losartan (COZAAR) 25 MG tablet Take 12.5 mg by mouth daily. 10/05/21   [provider]  losartan (COZAAR) 50 MG tablet Take 50 mg by mouth daily. 11/10/21   [provider]  montelukast (SINGULAIR) 10 MG tablet Take 10 mg by mouth daily. 12/28/21   [provider]  omeprazole (PRILOSEC) 40 MG capsule Take by mouth. 10/05/15   [provider]  ondansetron (ZOFRAN-ODT) 4 MG disintegrating tablet Take 1 tablet (4 mg total) by mouth every 8 (eight) hours as needed for nausea or vomiting. 10/04/21   Raylene Everts, MD  potassium chloride SA (KLOR-CON M) 20 MEQ tablet Take 1 tablet (20 mEq total) by mouth 2 (two) times daily for 5 days. 12/14/21 12/19/21  Darlina Rumpf, CNM  escitalopram (LEXAPRO) 10 MG tablet Take 10 mg by mouth daily.    09/02/11  [provider]    Family History Family History  Problem Relation Age of Onset   Kidney cancer Mother    Hypertension Mother    Migraines Mother    Multiple sclerosis Mother    Fibromyalgia Mother    Liver  disease Mother        NASH    Hypertension Father    Liver disease Father        liver transplant; had fatty liver disease   Spina bifida Brother    Migraines Maternal Grandmother    Hypertension Other    Colon cancer Paternal Grandfather    Allergic rhinitis Daughter    Asthma Daughter    Allergic rhinitis Son    Asthma Son    Angioedema Neg Hx    Eczema Neg Hx    Immunodeficiency Neg Hx    Urticaria Neg Hx    Social History Social History   Tobacco Use   Smoking status: Former    Packs/day: 0.25    Years: 10.00    Total pack years: 2.50    Types: Cigarettes   Smokeless tobacco: Never   Tobacco comments:    2-3 cigarettes a day  Vaping Use   Vaping Use: Never used  Substance Use Topics   Alcohol use: Yes    Alcohol/week: 0.0 standard drinks of alcohol    Comment: ocassionally   Drug use: No   Allergies   Clarithromycin, Doxycycline, Bentyl [dicyclomine hcl], Haldol [haloperidol], Meclizine, Morphine and related, Toradol [ketorolac tromethamine], Nitrofurantoin, and Sulfa antibiotics  Review of Systems Review of Systems Pertinent findings revealed after performing a 14 point review of systems has been noted in the history of present illness.  Physical Exam Triage Vital Signs ED Triage Vitals  Enc Vitals Group     BP 04/23/21 0827 (!) 147/82     Pulse Rate 04/23/21 0827 72     Resp 04/23/21 0827 18     Temp 04/23/21 0827 98.3 F (36.8 C)     Temp Source 04/23/21 0827 Oral     SpO2 04/23/21 0827 98 %     Weight --      Height --      Head Circumference --      Peak Flow --      Pain Score 04/23/21 0826 5     Pain Loc --      Pain Edu? --      Excl. in [place]? --   No data found.  Updated Vital Signs BP 129/82 (BP Location: Right Arm)   Pulse 77   Temp 98.3 F (36.8 C) (Oral)   Resp 18   LMP 01/19/2022   SpO2 96%   Breastfeeding No   Physical Exam  Visual Acuity Right Eye Distance:   Left Eye Distance:   Bilateral Distance:  Right Eye  Near:   Left Eye Near:    Bilateral Near:     UC Couse / Diagnostics / Procedures:     Radiology No results found.  Procedures Procedures (including critical care time) EKG  Pending results:  Labs Reviewed - No data to display  Medications Ordered in UC: Medications - No data to display  UC Diagnoses / Final Clinical Impressions(s)   I have reviewed the triage vital signs and the nursing notes.  Pertinent labs & imaging results that were available during my care of the patient were reviewed by me and considered in my medical decision making (see chart for details).    Final diagnoses:  Bruise   ***  ED Prescriptions   None    PDMP not reviewed this encounter.  Pending results:  Labs Reviewed - No data to display  Discharge Instructions:   Discharge Instructions      If you notice more bruises mysteriously appearing on your body, please be sure you follow-up with your primary care provider for further evaluation.  Thank you for visiting urgent care today.    Disposition Upon Discharge:  Condition: stable for discharge home  Patient presented with an acute illness with associated systemic symptoms and significant discomfort requiring urgent management. In my opinion, this is a condition that a prudent lay person (someone who possesses an average knowledge of health and medicine) may potentially expect to result in complications if not addressed urgently such as respiratory distress, impairment of bodily function or dysfunction of bodily organs.   Routine symptom specific, illness specific and/or disease specific instructions were discussed with the patient and/or caregiver at length.   As such, the patient has been evaluated and assessed, work-up was performed and treatment was provided in alignment with urgent care protocols and evidence based medicine.  Patient/parent/caregiver has been advised that the patient may require follow up for further testing and  treatment if the symptoms continue in spite of treatment, as clinically indicated and appropriate.  Patient/parent/caregiver has been advised to return to the [facility] or PCP if no better; to PCP or the Emergency Department if new signs and symptoms develop, or if the current signs or symptoms continue to change or worsen for further workup, evaluation and treatment as clinically indicated and appropriate  The patient will follow up with their current PCP if and as advised. If the patient does not currently have a PCP we will assist them in obtaining one.   The patient may need specialty follow up if the symptoms continue, in spite of conservative treatment and management, for further workup, evaluation, consultation and treatment as clinically indicated and appropriate.   Patient/parent/caregiver verbalized understanding and agreement of plan as discussed.  All questions were addressed during visit.  Please see discharge instructions below for further details of plan.  This office note has been dictated using Museum/gallery curator.  Unfortunately, this method of dictation can sometimes lead to typographical or grammatical errors.  I apologize for your inconvenience in advance if this occurs.  Please do not hesitate to reach out to me if clarification is needed.

## 2022-02-05 NOTE — Discharge Instructions (Signed)
If you notice more bruises mysteriously appearing on your body, please be sure you follow-up with your primary care provider for further evaluation.  Thank you for visiting urgent care today.

## 2022-02-05 NOTE — Progress Notes (Signed)
Hello Ms. Rota,  Your pictures do confirm a bruise. Usually we recommend elevation of the area, applying ice packs for the first 24 to 48 hours after bruise appears. After 48 hours we recommend applying a heating pad or warm compress to the injured area. You can take tylenol for pain. If there is swelling that seems to be increasing in size I would recommend you be seen at an urgent care for this. Other than that continue home therapy as instructed.  Bruises or discoloration itself can last up to 14 days total.    Here is a list of urgent care centers if needed.   NOTE: There will be NO CHARGE for this eVisit   If you are having a true medical emergency please call 911.      For an urgent face to face visit, [place] has seven urgent care centers for your convenience:     [facility] [place] [facility] [place] Get Driving Directions [phone] [place], [place] [address]    [place] [place] [facility] Get Driving Directions [phone] [place], [place] [address]  [facility] [place] ([place]) Get Driving Directions [phone] [address] [place],  [place]  [address]  [place] - at [address] Get Driving Directions  [phone] [address] [place],  [place] [address]   [facility] Get Driving Directions [phone] [address] [place], [place], [place] [address]   [facility] Get Driving Directions  [phone] [address] [place], [place] [address]   [facility] [place] Get Driving Directions [phone] [address] [place], [place] [address]  Your MyChart E-visit questionnaire answers were reviewed by a board certified advanced clinical practitioner to complete your personal  care plan based on your specific symptoms.  Thank you for using e-Visits.

## 2022-02-25 ENCOUNTER — Other Ambulatory Visit: Payer: Self-pay

## 2022-02-25 ENCOUNTER — Encounter (HOSPITAL_BASED_OUTPATIENT_CLINIC_OR_DEPARTMENT_OTHER): Payer: Self-pay

## 2022-02-25 DIAGNOSIS — R0789 Other chest pain: Secondary | ICD-10-CM | POA: Diagnosis not present

## 2022-02-25 DIAGNOSIS — R5383 Other fatigue: Secondary | ICD-10-CM | POA: Insufficient documentation

## 2022-02-25 DIAGNOSIS — I10 Essential (primary) hypertension: Secondary | ICD-10-CM | POA: Diagnosis not present

## 2022-02-25 DIAGNOSIS — Z79899 Other long term (current) drug therapy: Secondary | ICD-10-CM | POA: Insufficient documentation

## 2022-02-25 LAB — CBC
HCT: 38.2 % (ref 36.0–46.0)
Hemoglobin: 12.5 g/dL (ref 12.0–15.0)
MCH: 28.5 pg (ref 26.0–34.0)
MCHC: 32.7 g/dL (ref 30.0–36.0)
MCV: 87 fL (ref 80.0–100.0)
Platelets: 310 10*3/uL (ref 150–400)
RBC: 4.39 MIL/uL (ref 3.87–5.11)
RDW: 13.5 % (ref 11.5–15.5)
WBC: 7.8 10*3/uL (ref 4.0–10.5)
nRBC: 0 % (ref 0.0–0.2)

## 2022-02-25 LAB — PREGNANCY, URINE: Preg Test, Ur: NEGATIVE

## 2022-02-25 NOTE — ED Triage Notes (Signed)
Sts that while sitting at work this afternoon, chest pain began, sts midsternal chest tightness that is worse when she lays down, she tooks antacid, aspirin '81mg'$ , and tylenol without relief. Called cardiologist who she sees for HTN and they said to come to ED.

## 2022-02-26 ENCOUNTER — Emergency Department (HOSPITAL_BASED_OUTPATIENT_CLINIC_OR_DEPARTMENT_OTHER): Payer: Medicaid Other

## 2022-02-26 ENCOUNTER — Emergency Department (HOSPITAL_BASED_OUTPATIENT_CLINIC_OR_DEPARTMENT_OTHER)
Admission: EM | Admit: 2022-02-26 | Discharge: 2022-02-26 | Disposition: A | Discharge status: Home / Self Care | Payer: Medicaid Other | Attending: Emergency Medicine | Admitting: Emergency Medicine

## 2022-02-26 DIAGNOSIS — R0789 Other chest pain: Secondary | ICD-10-CM

## 2022-02-26 LAB — HEPATIC FUNCTION PANEL
ALT: 32 U/L (ref 0–44)
AST: 19 U/L (ref 15–41)
Albumin: 4.6 g/dL (ref 3.5–5.0)
Alkaline Phosphatase: 58 U/L (ref 38–126)
Bilirubin, Direct: 0.2 mg/dL (ref 0.0–0.2)
Indirect Bilirubin: 0.3 mg/dL (ref 0.3–0.9)
Total Bilirubin: 0.5 mg/dL (ref 0.3–1.2)
Total Protein: 7.6 g/dL (ref 6.5–8.1)

## 2022-02-26 LAB — TROPONIN I (HIGH SENSITIVITY): Troponin I (High Sensitivity): 4 ng/L (ref <18)

## 2022-02-26 LAB — BASIC METABOLIC PANEL
Anion gap: 10 (ref 5–15)
BUN: 9 mg/dL (ref 6–20)
CO2: 26 mmol/L (ref 22–32)
Calcium: 9.5 mg/dL (ref 8.9–10.3)
Chloride: 102 mmol/L (ref 98–111)
Creatinine, Ser: 0.76 mg/dL (ref 0.44–1.00)
GFR, Estimated: >60 mL/min (ref >60)
Glucose, Bld: 99 mg/dL (ref 70–99)
Potassium: 3.5 mmol/L (ref 3.5–5.1)
Sodium: 138 mmol/L (ref 135–145)

## 2022-02-26 LAB — LIPASE, BLOOD: Lipase: 30 U/L (ref 11–51)

## 2022-02-26 MED ORDER — MAALOX MAX 400-400-40 MG/5ML PO SUSP: 10.0000 mL | Freq: Four times a day (QID) | ORAL | 0 refills | Status: DC | PRN

## 2022-02-26 MED ORDER — LIDOCAINE VISCOUS HCL 2 % MT SOLN
15.0000 mL | Freq: Once | OROMUCOSAL | Status: AC
  Administered 2022-02-26: 15 mL via OROMUCOSAL
  Filled 2022-02-26: qty 15

## 2022-02-26 MED ORDER — ALUM & MAG HYDROXIDE-SIMETH 200-200-20 MG/5ML PO SUSP
30.0000 mL | Freq: Once | ORAL | Status: AC
  Administered 2022-02-26: 30 mL via ORAL
  Filled 2022-02-26: qty 30

## 2022-02-26 MED ORDER — LIDOCAINE VISCOUS HCL 2 % MT SOLN: 10.0000 mL | Freq: Four times a day (QID) | OROMUCOSAL | 0 refills | Status: DC | PRN

## 2022-02-26 NOTE — ED Provider Notes (Signed)
[facility] DE[facility] Provider Note  CSN: [id number] Arrival date & time: 02/25/22 2306  Chief Complaint(s) Chest Pain  HPI Christina Fox is a 41 y.o. female     Chest Pain Pain location:  Substernal area Pain quality: pressure and sharp   Pain radiates to:  Does not radiate Pain severity:  Moderate Onset quality:  Gradual Duration:  13 hours Timing:  Constant Relieved by:  Antacids Exacerbated by: lying down. Associated symptoms: fatigue   Associated symptoms: no cough, no fever, no headache, no lower extremity edema, no nausea, no shortness of breath and no vomiting   Risk factors: hypertension   Risk factors: no coronary artery disease, no diabetes mellitus, no high cholesterol, no immobilization, not female, not obese, not pregnant and no smoking     Past Medical History Past Medical History:  Diagnosis Date   Abdominal pain    "right lower quadrant pain and right lower back pain   Anxiety    Deviated septum 09/2011   GERD (gastroesophageal reflux disease)    Headache(784.0)    sinus   History of echocardiogram    Echo 11/17: EF 55-60, no RWMA, normal diastolic function   Hypertension    no meds   Kidney stones    no current problem   Migraine    Nasal turbinate hypertrophy 09/2011   bilat.   Pelvic inflammatory disease    Urticaria    Patient Active Problem List   Diagnosis Date Noted   Elevated ALT measurement 12/30/2021   Upper airway cough syndrome 07/08/2020   Gallstone 01/07/2016   History of colonic polyps 11/27/2015   Right lower quadrant abdominal pain 11/27/2015   HTN (hypertension), benign 11/24/2015   Abdominal pain 11/16/2015   Headache 10/13/2015   Thyromegaly 11/03/2014   Anxiety disorder 11/03/2014   Home Medication(s) Prior to Admission medications   Medication Sig Start Date End Date Taking? Authorizing Provider  alum & mag hydroxide-simeth (MAALOX MAX) 400-400-40 MG/5ML suspension Take 10 mLs by mouth every 6  (six) hours as needed for indigestion. 02/26/22  Yes Jaquavis Felmlee, Grayce Sessions, MD  lidocaine (XYLOCAINE) 2 % solution Use as directed 10 mLs in the mouth or throat every 6 (six) hours as needed (stomach pain). 02/26/22  Yes Berenice Oehlert, Grayce Sessions, MD  diclofenac Sodium (VOLTAREN) 1 % GEL Apply 4 g topically 4 (four) times daily. Apply to affected areas 4 times daily as needed for pain. 10/06/21   Lynden Oxford Scales, PA-C  Drospirenone (SLYND) 4 MG TABS Take 1 tablet by mouth daily. 12/30/21   Aletha Halim, MD  levocetirizine Harlow Ohms ALLERGY 24HR) 5 MG tablet  10/04/20   [provider]  levocetirizine (XYZAL) 5 MG tablet Take 5 mg by mouth every evening.    [provider]  losartan (COZAAR) 25 MG tablet Take 12.5 mg by mouth daily. 10/05/21   [provider]  losartan (COZAAR) 50 MG tablet Take 50 mg by mouth daily. 11/10/21   [provider]  montelukast (SINGULAIR) 10 MG tablet Take 10 mg by mouth daily. 12/28/21   [provider]  omeprazole (PRILOSEC) 40 MG capsule Take by mouth. 10/05/15   [provider]  ondansetron (ZOFRAN-ODT) 4 MG disintegrating tablet Take 1 tablet (4 mg total) by mouth every 8 (eight) hours as needed for nausea or vomiting. 10/04/21   Raylene Everts, MD  potassium chloride SA (KLOR-CON M) 20 MEQ tablet Take 1 tablet (20 mEq total) by mouth 2 (two) times daily for  5 days. 12/14/21 12/19/21  Darlina Rumpf, CNM  escitalopram (LEXAPRO) 10 MG tablet Take 10 mg by mouth daily.    09/02/11  [provider]                                                                                                                                    Allergies Clarithromycin, Doxycycline, Bentyl [dicyclomine hcl], Haldol [haloperidol], Meclizine, Morphine and related, Toradol [ketorolac tromethamine], Nitrofurantoin, and Sulfa antibiotics  Review of Systems Review of Systems  Constitutional:  Positive for fatigue. Negative for  fever.  Respiratory:  Negative for cough and shortness of breath.   Cardiovascular:  Positive for chest pain.  Gastrointestinal:  Negative for nausea and vomiting.  Neurological:  Negative for headaches.   As noted in HPI  Physical Exam Vital Signs  I have reviewed the triage vital signs BP (!) 145/86   Pulse 65   Temp 97.6 F (36.4 C)   Resp 14   Ht '5\' 3"'$  (1.6 m)   Wt 89.8 kg   LMP 01/19/2022   SpO2 100%   BMI 35.07 kg/m   Physical Exam Vitals reviewed.  Constitutional:      General: She is not in acute distress.    Appearance: She is well-developed. She is not diaphoretic.  HENT:     Head: Normocephalic and atraumatic.     Nose: Nose normal.  Eyes:     General: No scleral icterus.       Right eye: No discharge.        Left eye: No discharge.     Conjunctiva/sclera: Conjunctivae normal.     Pupils: Pupils are equal, round, and reactive to light.  Cardiovascular:     Rate and Rhythm: Normal rate and regular rhythm.     Heart sounds: No murmur heard.    No friction rub. No gallop.  Pulmonary:     Effort: Pulmonary effort is normal. No respiratory distress.     Breath sounds: Normal breath sounds. No stridor. No rales.  Abdominal:     General: There is no distension.     Palpations: Abdomen is soft.     Tenderness: There is no abdominal tenderness.  Musculoskeletal:        General: No tenderness.     Cervical back: Normal range of motion and neck supple.  Skin:    General: Skin is warm and dry.     Findings: No erythema or rash.  Neurological:     Mental Status: She is alert and oriented to person, place, and time.     ED Results and Treatments Labs (all labs ordered are listed, but only abnormal results are displayed) Labs Reviewed  BASIC METABOLIC PANEL  CBC  PREGNANCY, URINE  HEPATIC FUNCTION PANEL  LIPASE, BLOOD  TROPONIN I (HIGH SENSITIVITY)  EKG  EKG Interpretation  Date/Time:  Friday February 25 2022 23:19:01 EDT Ventricular Rate:  78 PR Interval:  138 QRS Duration: 90 QT Interval:  384 QTC Calculation: 437 R Axis:   55 Text Interpretation: Normal sinus rhythm Nonspecific ST abnormality Abnormal ECG When compared with ECG of 06-Oct-2021 13:16, No significant change was found Confirmed by Addison Lank [phone]) on 02/26/2022 1:16:04 AM       Radiology DG Chest Port 1 View  Result Date: 02/26/2022 CLINICAL DATA:  Chest pain. EXAM: PORTABLE CHEST 1 VIEW COMPARISON:  Chest radiograph dated 10/06/2021. FINDINGS: The heart size and mediastinal contours are within normal limits. Both lungs are clear. The visualized skeletal structures are unremarkable. IMPRESSION: No active disease. Electronically Signed   By: Anner Crete M.D.   On: 02/26/2022 00:51    Medications Ordered in ED Medications  alum & mag hydroxide-simeth (MAALOX/MYLANTA) 200-200-20 MG/5ML suspension 30 mL (30 mLs Oral Given 02/26/22 0140)  lidocaine (XYLOCAINE) 2 % viscous mouth solution 15 mL (15 mLs Mouth/Throat Given 02/26/22 0140)                                                                                                                                     Procedures Procedures  (including critical care time)  Medical Decision Making / ED Course   Medical Decision Making Amount and/or Complexity of Data Reviewed Labs: ordered. Decision-making details documented in ED Course. Radiology: ordered and independent interpretation performed. Decision-making details documented in ED Course. ECG/medicine tests: ordered and independent interpretation performed. Decision-making details documented in ED Course.  Risk OTC drugs. Prescription drug management.    Atypical chest pain. Heart score less than 3. EKG without acute ischemic changes or evidence of pericarditis. Initial troponin drawn more than 12 hours after symptom onset was  negative.  I feel that this is sufficient to rule out ACS in this clinical picture given the duration of her constant symptoms.  Presentation is not classic for arctic dissection or esophageal perforation. Low suspicion for pulmonary embolism.  She is has low pretest probability. Chest x-ray without evidence of pneumonia, pneumothorax, pulmonary edema or pleural effusions.  CBC without leukocytosis or anemia. Metabolic panel without significant electrolyte derangements or renal sufficiency. No evidence of biliary obstruction or pancreatitis.  Most likely etiology gastritis/GERD.  Patient provided with GI cocktail that resulted in significant relief.       Final Clinical Impression(s) / ED Diagnoses Final diagnoses:  Chest discomfort   The patient appears reasonably screened and/or stabilized for discharge and I doubt any other medical condition or other [facility] requiring further screening, evaluation, or treatment in the ED at this time. I have discussed the findings, Dx and Tx plan with the patient/family who expressed understanding and agree(s) with the plan. Discharge instructions discussed at length. The patient/family was given strict return precautions who verbalized understanding of the instructions. No further questions at time of discharge.  Disposition: Discharge  Condition: Good  ED Discharge Orders          Ordered    alum & mag hydroxide-simeth (MAALOX MAX) 388-719-59 MG/5ML suspension  Every 6 hours PRN        02/26/22 0317    lidocaine (XYLOCAINE) 2 % solution  Every 6 hours PRN        02/26/22 0317             Follow Up: Sherald Hess., MD [place] [address] [phone]  Call  to schedule an appointment for close follow up           This chart was dictated using voice recognition software.  Despite best efforts to proofread,  errors can occur which can change the documentation meaning.    Fatima Blank,  MD 02/26/22 [phone]

## 2022-02-26 NOTE — ED Notes (Signed)
Late entry -- pt a&ox4.  GCS 15.  RR even and unlabored on RA with symmetrical rise and fall of chest.  Continuous pulse ox and cardiac monitoring maintained- NSR HR 60s -70s on monitor.  Abdomen soft nontender.  Skin warm dry and intact.  Pt reports midsternal cp radiating at times to R chest; no longer described as tightness- pt equates pain to pressure feeling.  Cardiac work up unremarkable.  Will monitor for acute changes and maintain plan of care.

## 2022-02-26 NOTE — ED Notes (Signed)
PCXR now in progress at bedside

## 2022-02-26 NOTE — ED Notes (Signed)
Pt agreeable with d/c plan as discussed by provider- this nurse has verbally reinforced d/c instructions and provided pt with written copy; pt acknowledges verbal understanding and denies any additional questions, concerns, needs- ambulatory independently at d/c with steady gait; no distress.

## 2022-03-02 ENCOUNTER — Telehealth: Payer: Medicaid Other | Admitting: Physician Assistant

## 2022-03-02 DIAGNOSIS — R1013 Epigastric pain: Secondary | ICD-10-CM | POA: Diagnosis not present

## 2022-03-02 MED ORDER — PANTOPRAZOLE SODIUM 40 MG PO TBEC: 40.0000 mg | DELAYED_RELEASE_TABLET | Freq: Every day | ORAL | 0 refills | Status: DC

## 2022-03-02 MED ORDER — FAMOTIDINE 40 MG PO TABS: 40.0000 mg | ORAL_TABLET | Freq: Every day | ORAL | 0 refills | Status: DC

## 2022-03-02 NOTE — Patient Instructions (Signed)
Christina Fox, thank you for joining Leeanne Rio, PA-C for today's virtual visit.  While this provider is not your primary care provider (PCP), if your PCP is located in our provider database this encounter information will be shared with them immediately following your visit.  Consent: (Patient) Christina Fox provided verbal consent for this virtual visit at the beginning of the encounter.  Current Medications:  Current Outpatient Medications:    famotidine (PEPCID) 40 MG tablet, Take 1 tablet (40 mg total) by mouth at bedtime., Disp: 15 tablet, Rfl: 0   pantoprazole (PROTONIX) 40 MG tablet, Take 1 tablet (40 mg total) by mouth daily., Disp: 30 tablet, Rfl: 0   alum & mag hydroxide-simeth (MAALOX MAX) [phone] MG/5ML suspension, Take 10 mLs by mouth every 6 (six) hours as needed for indigestion., Disp: 355 mL, Rfl: 0   Drospirenone (SLYND) 4 MG TABS, Take 1 tablet by mouth daily., Disp: 90 tablet, Rfl: 3   levocetirizine (XYZAL ALLERGY 24HR) 5 MG tablet, , Disp: , Rfl:    lidocaine (XYLOCAINE) 2 % solution, Use as directed 10 mLs in the mouth or throat every 6 (six) hours as needed (stomach pain)., Disp: 100 mL, Rfl: 0   losartan (COZAAR) 25 MG tablet, Take 12.5 mg by mouth daily., Disp: , Rfl:    losartan (COZAAR) 50 MG tablet, Take 50 mg by mouth daily., Disp: , Rfl:    montelukast (SINGULAIR) 10 MG tablet, Take 10 mg by mouth daily., Disp: , Rfl:    potassium chloride SA (KLOR-CON M) 20 MEQ tablet, Take 1 tablet (20 mEq total) by mouth 2 (two) times daily for 5 days., Disp: 10 tablet, Rfl: 0   Medications ordered in this encounter:  Meds ordered this encounter  Medications   pantoprazole (PROTONIX) 40 MG tablet    Sig: Take 1 tablet (40 mg total) by mouth daily.    Dispense:  30 tablet    Refill:  0    Order Specific Question:   Supervising Provider    Answer:   Sabra Heck, BRIAN [[id number]]   famotidine (PEPCID) 40 MG tablet    Sig: Take 1 tablet (40 mg total) by mouth at  bedtime.    Dispense:  15 tablet    Refill:  0    Order Specific Question:   Supervising Provider    Answer:   Sabra Heck, BRIAN [[id number]]     *If you need refills on other medications prior to your next appointment, please contact your pharmacy*  Follow-Up: Call back or seek an in-person evaluation if the symptoms worsen or if the condition fails to improve as anticipated.  Other Instructions Please keep well-hydrated. Avoid late night eating.  Avoid any alcohol consumption.  Limit caffeine. Elevate the head of your bed at night. Stop the omeprazole and start the Protonix once daily as directed. Take the famotidine nightly for the next few nights. Follow the dietary recommendations below. If symptoms not resolving or you note anything new or worsening despite treatment, especially before you can get in with your new gastroenterologist, I want you to be reevaluated in person.  Please do not delay care.  Food Choices for Gastroesophageal Reflux Disease, Adult When you have gastroesophageal reflux disease (GERD), the foods you eat and your eating habits are very important. Choosing the right foods can help ease your discomfort. Think about working with a food expert (dietitian) to help you make good choices. What are tips for following this plan? Reading food labels Look for  foods that are low in saturated fat. Foods that may help with your symptoms include: Foods that have less than 5% of daily value (DV) of fat. Foods that have 0 grams of trans fat. Cooking Do not fry your food. Cook your food by baking, steaming, grilling, or broiling. These are all methods that do not need a lot of fat for cooking. To add flavor, try to use herbs that are low in spice and acidity. Meal planning  Choose healthy foods that are low in fat, such as: Fruits and vegetables. Whole grains. Low-fat dairy products. Lean meats, fish, and poultry. Eat small meals often instead of eating 3 large meals each day.  Eat your meals slowly in a place where you are relaxed. Avoid bending over or lying down until 2-3 hours after eating. Limit high-fat foods such as fatty meats or fried foods. Limit your intake of fatty foods, such as oils, butter, and shortening. Avoid the following as told by your doctor: Foods that cause symptoms. These may be different for different people. Keep a food diary to keep track of foods that cause symptoms. Alcohol. Drinking a lot of liquid with meals. Eating meals during the 2-3 hours before bed. Lifestyle Stay at a healthy weight. Ask your doctor what weight is healthy for you. If you need to lose weight, work with your doctor to do so safely. Exercise for at least 30 minutes on 5 or more days each week, or as told by your doctor. Wear loose-fitting clothes. Do not smoke or use any products that contain nicotine or tobacco. If you need help quitting, ask your doctor. Sleep with the head of your bed higher than your feet. Use a wedge under the mattress or blocks under the bed frame to raise the head of the bed. Chew sugar-free gum after meals. What foods should eat?  Eat a healthy, well-balanced diet of fruits, vegetables, whole grains, low-fat dairy products, lean meats, fish, and poultry. Each person is different. Foods that may cause symptoms in one person may not cause any symptoms in another person. Work with your doctor to find foods that are safe for you. The items listed above may not be a complete list of what you can eat and drink. Contact a food expert for more options. What foods should I avoid? Limiting some of these foods may help in managing the symptoms of GERD. Everyone is different. Talk with a food expert or your doctor to help you find the exact foods to avoid, if any. Fruits Any fruits prepared with added fat. Any fruits that cause symptoms. For some people, this may include citrus fruits, such as oranges, grapefruit, pineapple, and  lemons. Vegetables Deep-fried vegetables. [place] fries. Any vegetables prepared with added fat. Any vegetables that cause symptoms. For some people, this may include tomatoes and tomato products, chili peppers, onions and garlic, and horseradish. Grains Pastries or quick breads with added fat. Meats and other proteins High-fat meats, such as fatty beef or pork, hot dogs, ribs, ham, sausage, salami, and bacon. Fried meat or protein, including fried fish and fried chicken. Nuts and nut butters, in large amounts. Dairy Whole milk and chocolate milk. Sour cream. Cream. Ice cream. Cream cheese. Milkshakes. Fats and oils Butter. Margarine. Shortening. Ghee. Beverages Coffee and tea, with or without caffeine. Carbonated beverages. Sodas. Energy drinks. Fruit juice made with acidic fruits, such as orange or grapefruit. Tomato juice. Alcoholic drinks. Sweets and desserts Chocolate and cocoa. Donuts. Seasonings and condiments Pepper. Peppermint  and spearmint. Added salt. Any condiments, herbs, or seasonings that cause symptoms. For some people, this may include curry, hot sauce, or vinegar-based salad dressings. The items listed above may not be a complete list of what you should not eat and drink. Contact a food expert for more options. Questions to ask your doctor Diet and lifestyle changes are often the first steps that are taken to manage symptoms of GERD. If diet and lifestyle changes do not help, talk with your doctor about taking medicines. Where to find more information International Foundation for Gastrointestinal Disorders: aboutgerd.org Summary When you have GERD, food and lifestyle choices are very important in easing your symptoms. Eat small meals often instead of 3 large meals a day. Eat your meals slowly and in a place where you are relaxed. Avoid bending over or lying down until 2-3 hours after eating. Limit high-fat foods such as fatty meats or fried foods. This information is not  intended to replace advice given to you by your health care provider. Make sure you discuss any questions you have with your health care provider. Document Revised: 12/23/2019 Document Reviewed: 12/23/2019 Elsevier Patient Education  [place] .    If you have been instructed to have an in-person evaluation today at a local Urgent Care facility, please use the link below. It will take you to a list of all of our available [facility], including address, phone number and hours of operation. Please do not delay care.  [facility]  If you or a family member do not have a primary care provider, use the link below to schedule a visit and establish care. When you choose a [facility] primary care physician or advanced practice provider, you gain a long-term partner in health. Find a Primary Care Provider  Learn more about 's in-office and virtual care options: [facility]

## 2022-03-02 NOTE — Progress Notes (Signed)
Virtual Visit Consent   Christina Fox, you are scheduled for a virtual visit with a [facility] provider today. Just as with appointments in the office, your consent must be obtained to participate. Your consent will be active for this visit and any virtual visit you may have with one of our providers in the next 365 days. If you have a MyChart account, a copy of this consent can be sent to you electronically.  As this is a virtual visit, video technology does not allow for your provider to perform a traditional examination. This may limit your provider's ability to fully assess your condition. If your provider identifies any concerns that need to be evaluated in person or the need to arrange testing (such as labs, EKG, etc.), we will make arrangements to do so. Although advances in technology are sophisticated, we cannot ensure that it will always work on either your end or our end. If the connection with a video visit is poor, the visit may have to be switched to a telephone visit. With either a video or telephone visit, we are not always able to ensure that we have a secure connection.  By engaging in this virtual visit, you consent to the provision of healthcare and authorize for your insurance to be billed (if applicable) for the services provided during this visit. Depending on your insurance coverage, you may receive a charge related to this service.  I need to obtain your verbal consent now. Are you willing to proceed with your visit today? Christina Fox has provided verbal consent on 03/02/2022 for a virtual visit (video or telephone). Christina Fox, [place]  Date: 03/02/2022 1:44 PM  Virtual Visit via Video Note   I, Christina Fox, connected with  Christina Fox  ([id number], Nov 09, 1980) on 03/02/22 at  1:45 PM EDT by a video-enabled telemedicine application and verified that I am speaking with the correct person using two identifiers.  Location: Patient: Virtual Visit  Location Patient: Home Provider: Virtual Visit Location Provider: Home Office   I discussed the limitations of evaluation and management by telemedicine and the availability of in person appointments. The patient expressed understanding and agreed to proceed.    History of Present Illness: Christina Fox is a 41 y.o. who identifies as a female who was assigned female at birth, and is being seen today for continued esophagitis.  Patient endorses ongoing issue with acid reflux, bloating and fullness.  Is currently in between gastroenterologist and awaiting an appointment to establish with new specialist.  Given worsening symptoms, patient was evaluated.  In the ER 4 days ago.  Work-up including full lab panel was unremarkable.  Patient was given a Maalox and lidocaine solution to use as needed.  Endorses taking but does not notice that causes a change in symptoms.  Still noting substantial acid reflux and a tightness sensation in her throat.  Denies shortness of breath.  Denies vomiting but notes some nausea and anorexia.  Denies any significant change to bowel habits.  Is taking her omeprazole but does not feel this is working for her.  HPI: HPI  Problems:  Patient Active Problem List   Diagnosis Date Noted   Elevated ALT measurement 12/30/2021   Upper airway cough syndrome 07/08/2020   Gallstone 01/07/2016   History of colonic polyps 11/27/2015   Right lower quadrant abdominal pain 11/27/2015   HTN (hypertension), benign 11/24/2015   Abdominal pain 11/16/2015   Headache 10/13/2015   Thyromegaly 11/03/2014  Anxiety disorder 11/03/2014    Allergies:  Allergies  Allergen Reactions   Clarithromycin Hives and Itching   Doxycycline Shortness Of Breath    Chest pain   Bentyl [Dicyclomine Hcl] Other (See Comments)    DIZZINESS   Haldol [Haloperidol] Other (See Comments)    Made her feel very jittery and agitated   Meclizine     Chest pain   Morphine And Related Other (See Comments)     Made her feel like she was losing her mind   Toradol [Ketorolac Tromethamine] Other (See Comments)    Made her feel very jittery and agitated   Nitrofurantoin Rash   Sulfa Antibiotics Hives and Rash   Medications:  Current Outpatient Medications:    famotidine (PEPCID) 40 MG tablet, Take 1 tablet (40 mg total) by mouth at bedtime., Disp: 15 tablet, Rfl: 0   pantoprazole (PROTONIX) 40 MG tablet, Take 1 tablet (40 mg total) by mouth daily., Disp: 30 tablet, Rfl: 0   alum & mag hydroxide-simeth (MAALOX MAX) 818-563-14 MG/5ML suspension, Take 10 mLs by mouth every 6 (six) hours as needed for indigestion., Disp: 355 mL, Rfl: 0   Drospirenone (SLYND) 4 MG TABS, Take 1 tablet by mouth daily., Disp: 90 tablet, Rfl: 3   levocetirizine (XYZAL ALLERGY 24HR) 5 MG tablet, , Disp: , Rfl:    lidocaine (XYLOCAINE) 2 % solution, Use as directed 10 mLs in the mouth or throat every 6 (six) hours as needed (stomach pain)., Disp: 100 mL, Rfl: 0   losartan (COZAAR) 25 MG tablet, Take 12.5 mg by mouth daily., Disp: , Rfl:    losartan (COZAAR) 50 MG tablet, Take 50 mg by mouth daily., Disp: , Rfl:    montelukast (SINGULAIR) 10 MG tablet, Take 10 mg by mouth daily., Disp: , Rfl:    potassium chloride SA (KLOR-CON M) 20 MEQ tablet, Take 1 tablet (20 mEq total) by mouth 2 (two) times daily for 5 days., Disp: 10 tablet, Rfl: 0  Observations/Objective: Patient is well-developed, well-nourished in no acute distress.  Resting comfortably at home.  Head is normocephalic, atraumatic.  No labored breathing. Speech is clear and coherent with logical content.  Patient is alert and oriented at baseline.   Assessment and Plan: 1. Dyspepsia - pantoprazole (PROTONIX) 40 MG tablet; Take 1 tablet (40 mg total) by mouth daily.  Dispense: 30 tablet; Refill: 0 - famotidine (PEPCID) 40 MG tablet; Take 1 tablet (40 mg total) by mouth at bedtime.  Dispense: 15 tablet; Refill: 0  Continue good hydration.  She does drink  occasionally.  She is to avoid all alcohol consumption.  GERD diet reviewed and handout given.  We will switch her from omeprazole to Protonix 40 mg once daily.  We will add on the famotidine 40 mg nightly for the next couple of days.  Recommend daily probiotic.  If anything worsening or no substantial improvement before she can get in with her new gastroenterologist, she needs reevaluation in person  Follow Up Instructions: I discussed the assessment and treatment plan with the patient. The patient was provided an opportunity to ask questions and all were answered. The patient agreed with the plan and demonstrated an understanding of the instructions.  A copy of instructions were sent to the patient via MyChart unless otherwise noted below.    The patient was advised to call back or seek an in-person evaluation if the symptoms worsen or if the condition fails to improve as anticipated.  Time:  I spent 12  minutes with the patient via telehealth technology discussing the above problems/concerns.    Christina Rio, PA-C

## 2022-03-08 ENCOUNTER — Telehealth: Payer: Medicaid Other | Admitting: Physician Assistant

## 2022-03-08 DIAGNOSIS — B379 Candidiasis, unspecified: Secondary | ICD-10-CM

## 2022-03-08 DIAGNOSIS — T3695XA Adverse effect of unspecified systemic antibiotic, initial encounter: Secondary | ICD-10-CM | POA: Diagnosis not present

## 2022-03-08 MED ORDER — FLUCONAZOLE 150 MG PO TABS: 150.0000 mg | ORAL_TABLET | Freq: Once | ORAL | 0 refills | Status: AC

## 2022-03-08 NOTE — Progress Notes (Signed)

## 2022-03-08 NOTE — Progress Notes (Signed)
I have spent 5 minutes in review of e-visit questionnaire, review and updating patient chart, medical decision making and response to patient.   Aminat Shelburne Cody Kameisha Malicki, PA-C    

## 2022-04-01 ENCOUNTER — Encounter: Payer: Self-pay | Admitting: Obstetrics and Gynecology

## 2022-04-08 ENCOUNTER — Other Ambulatory Visit (HOSPITAL_COMMUNITY): Payer: Self-pay | Admitting: Gastroenterology

## 2022-04-08 ENCOUNTER — Other Ambulatory Visit: Payer: Self-pay | Admitting: Gastroenterology

## 2022-04-08 DIAGNOSIS — R1011 Right upper quadrant pain: Secondary | ICD-10-CM

## 2022-04-11 ENCOUNTER — Ambulatory Visit (HOSPITAL_BASED_OUTPATIENT_CLINIC_OR_DEPARTMENT_OTHER)
Admission: RE | Admit: 2022-04-11 | Discharge: 2022-04-11 | Disposition: A | Discharge status: Home / Self Care | Payer: Medicaid Other | Source: Ambulatory Visit | Attending: Gastroenterology | Admitting: Gastroenterology

## 2022-04-11 ENCOUNTER — Other Ambulatory Visit: Payer: Self-pay | Admitting: Obstetrics and Gynecology

## 2022-04-11 DIAGNOSIS — R1011 Right upper quadrant pain: Secondary | ICD-10-CM | POA: Diagnosis present

## 2022-04-11 MED ORDER — NORETHINDRONE 0.35 MG PO TABS: 1.0000 | ORAL_TABLET | Freq: Every day | ORAL | 2 refills | Status: DC

## 2022-04-12 ENCOUNTER — Other Ambulatory Visit (HOSPITAL_BASED_OUTPATIENT_CLINIC_OR_DEPARTMENT_OTHER): Payer: Medicaid Other

## 2022-04-27 ENCOUNTER — Telehealth: Payer: Medicaid Other | Admitting: Emergency Medicine

## 2022-04-27 DIAGNOSIS — B9789 Other viral agents as the cause of diseases classified elsewhere: Secondary | ICD-10-CM | POA: Diagnosis not present

## 2022-04-27 DIAGNOSIS — J019 Acute sinusitis, unspecified: Secondary | ICD-10-CM

## 2022-04-27 NOTE — Progress Notes (Signed)
E-Visit for Sinus Problems  We are sorry that you are not feeling well.  Here is how we plan to help!  Let me know if you need a note for work.   Based on what you have shared with me it looks like you have sinusitis.  Sinusitis is inflammation and infection in the sinus cavities of the head.  Based on your presentation I believe you most likely have Acute Viral Sinusitis.This is an infection most likely caused by a virus. There is not specific treatment for viral sinusitis other than to help you with the symptoms until the infection runs its course.    Antibiotics are not recommended by the Infectious Disease Society of [place] unless you have severe symptoms (including high fever) or you have symptoms for more than 10 days. If you still have symptoms after 10 days, antibiotics should be considered.    You may use an oral decongestant such as Mucinex D or if you have glaucoma or high blood pressure use plain Mucinex.   Saline nasal spray help and can safely be used as often as needed for congestion. Try using saline irrigation, such as with a neti pot, several times a day while you are sick. Many neti pots come with salt packets premeasured to use to make saline. If you use your own salt, make sure it is kosher salt or sea salt (don't use table salt as it has iodine in it and you don't need that in your nose). Use distilled water to make saline. If you mix your own saline using your own salt, the recipe is 1/4 teaspoon salt in 1 cup warm water. Using saline irrigation can help prevent and treat sinus infections.    Some authorities believe that zinc sprays or the use of Echinacea may shorten the course of your symptoms.  Sinus infections are not as easily transmitted as other respiratory infection, however we still recommend that you avoid close contact with loved ones, especially the very young and elderly.  Remember to wash your hands thoroughly throughout the day as this is the number one way to  prevent the spread of infection!  Home Care: Only take medications as instructed by your medical team. Do not take these medications with alcohol. A steam or ultrasonic humidifier can help congestion.  You can place a towel over your head and breathe in the steam from hot water coming from a faucet. Avoid close contacts especially the very young and the elderly. Cover your mouth when you cough or sneeze. Always remember to wash your hands.  Get Help Right Away If: You develop worsening fever or sinus pain. You develop a severe head ache or visual changes. Your symptoms persist after you have completed your treatment plan.  Make sure you Understand these instructions. Will watch your condition. Will get help right away if you are not doing well or get worse.   Thank you for choosing an e-visit.  Your e-visit answers were reviewed by a board certified advanced clinical practitioner to complete your personal care plan. Depending upon the condition, your plan could have included both over the counter or prescription medications.  Please review your pharmacy choice. Make sure the pharmacy is open so you can pick up prescription now. If there is a problem, you may contact your provider through [employer] and have the prescription routed to another pharmacy.  Your safety is important to [place]. If you have drug allergies check your prescription carefully.   For the  next 24 hours you can use MyChart to ask questions about today's visit, request a non-urgent call back, or ask for a work or school excuse. You will get an email in the next two days asking about your experience. I hope that your e-visit has been valuable and will speed your recovery.   I have spent 5 minutes in review of e-visit questionnaire, review and updating patient chart, medical decision making and response to patient.   Willeen Cass, PhD, FNP-BC

## 2022-05-12 ENCOUNTER — Other Ambulatory Visit: Payer: Self-pay | Admitting: Emergency Medicine

## 2022-05-12 ENCOUNTER — Other Ambulatory Visit: Payer: Self-pay | Admitting: Obstetrics

## 2022-05-12 DIAGNOSIS — D508 Other iron deficiency anemias: Secondary | ICD-10-CM

## 2022-05-12 MED ORDER — VITAFOL ULTRA 29-0.6-0.4-200 MG PO CAPS: 1.0000 | ORAL_CAPSULE | Freq: Every day | ORAL | 4 refills | Status: DC

## 2022-05-12 NOTE — Progress Notes (Signed)
Vitafol

## 2022-05-24 ENCOUNTER — Other Ambulatory Visit: Payer: Self-pay | Admitting: Oncology

## 2022-05-24 NOTE — Progress Notes (Signed)
error 

## 2022-05-25 ENCOUNTER — Emergency Department (HOSPITAL_BASED_OUTPATIENT_CLINIC_OR_DEPARTMENT_OTHER): Payer: Medicaid Other

## 2022-05-25 ENCOUNTER — Emergency Department (HOSPITAL_BASED_OUTPATIENT_CLINIC_OR_DEPARTMENT_OTHER)
Admission: EM | Admit: 2022-05-25 | Discharge: 2022-05-25 | Disposition: A | Discharge status: Home / Self Care | Payer: Medicaid Other | Attending: Emergency Medicine | Admitting: Emergency Medicine

## 2022-05-25 ENCOUNTER — Other Ambulatory Visit: Payer: Self-pay

## 2022-05-25 ENCOUNTER — Encounter (HOSPITAL_BASED_OUTPATIENT_CLINIC_OR_DEPARTMENT_OTHER): Payer: Self-pay

## 2022-05-25 DIAGNOSIS — M25562 Pain in left knee: Secondary | ICD-10-CM | POA: Diagnosis not present

## 2022-05-25 DIAGNOSIS — R5383 Other fatigue: Secondary | ICD-10-CM | POA: Diagnosis not present

## 2022-05-25 DIAGNOSIS — Z79899 Other long term (current) drug therapy: Secondary | ICD-10-CM | POA: Insufficient documentation

## 2022-05-25 DIAGNOSIS — I10 Essential (primary) hypertension: Secondary | ICD-10-CM | POA: Diagnosis not present

## 2022-05-25 DIAGNOSIS — Z20822 Contact with and (suspected) exposure to covid-19: Secondary | ICD-10-CM | POA: Insufficient documentation

## 2022-05-25 DIAGNOSIS — R072 Precordial pain: Secondary | ICD-10-CM | POA: Insufficient documentation

## 2022-05-25 DIAGNOSIS — R0789 Other chest pain: Secondary | ICD-10-CM

## 2022-05-25 DIAGNOSIS — R7309 Other abnormal glucose: Secondary | ICD-10-CM | POA: Insufficient documentation

## 2022-05-25 LAB — RESP PANEL BY RT-PCR (FLU A&B, COVID) ARPGX2
Influenza A by PCR: NEGATIVE
Influenza B by PCR: NEGATIVE
SARS Coronavirus 2 by RT PCR: NEGATIVE

## 2022-05-25 LAB — TROPONIN I (HIGH SENSITIVITY)
Troponin I (High Sensitivity): 2 ng/L (ref <18)
Troponin I (High Sensitivity): 3 ng/L (ref <18)

## 2022-05-25 LAB — CBC
HCT: 40.1 % (ref 36.0–46.0)
Hemoglobin: 13 g/dL (ref 12.0–15.0)
MCH: 28.8 pg (ref 26.0–34.0)
MCHC: 32.4 g/dL (ref 30.0–36.0)
MCV: 88.7 fL (ref 80.0–100.0)
Platelets: 322 10*3/uL (ref 150–400)
RBC: 4.52 MIL/uL (ref 3.87–5.11)
RDW: 13.7 % (ref 11.5–15.5)
WBC: 9.2 10*3/uL (ref 4.0–10.5)
nRBC: 0 % (ref 0.0–0.2)

## 2022-05-25 LAB — BASIC METABOLIC PANEL
Anion gap: 9 (ref 5–15)
BUN: 13 mg/dL (ref 6–20)
CO2: 25 mmol/L (ref 22–32)
Calcium: 9.3 mg/dL (ref 8.9–10.3)
Chloride: 103 mmol/L (ref 98–111)
Creatinine, Ser: 0.68 mg/dL (ref 0.44–1.00)
GFR, Estimated: >60 mL/min (ref >60)
Glucose, Bld: 110 mg/dL — ABNORMAL HIGH (ref 70–99)
Potassium: 3.5 mmol/L (ref 3.5–5.1)
Sodium: 137 mmol/L (ref 135–145)

## 2022-05-25 LAB — D-DIMER, QUANTITATIVE: D-Dimer, Quant: 0.27 ug/mL-FEU (ref 0.00–0.50)

## 2022-05-25 LAB — HCG, SERUM, QUALITATIVE: Preg, Serum: NEGATIVE

## 2022-05-25 NOTE — ED Provider Notes (Signed)
Christina Fox Note   CSN: [id number] Arrival date & time: 05/25/22  1321     History {Add pertinent medical, surgical, social history, OB history to HPI:1} Chief Complaint  Patient presents with  . Chest Pain    Christina Fox is a 41 y.o. female.  Pt here with chills, chest pressure since this morning, fatigue. Pain behind Left knee last night.  + htn Previous stress test at [facility] normal.  The history is provided by the patient. No language interpreter was used.  Chest Pain Pain location:  Substernal area Pain quality: pressure   Pain radiates to:  Does not radiate Pain severity:  Mild Onset quality:  Gradual Duration:  1 day Timing:  Constant Progression:  Unchanged Chronicity:  New Context: not breathing, not drug use, not eating, not intercourse, not lifting, not movement, not raising an arm, not at rest and not stress   Relieved by:  Nothing Worsened by:  Nothing Ineffective treatments:  None tried Associated symptoms: no vomiting   Risk factors: hypertension   Risk factors: no birth control (Patient on Non-estrogen OCP), no diabetes mellitus, no prior DVT/PE and no smoking        Home Medications Prior to Admission medications   Medication Sig Start Date End Date Taking? Authorizing Fox  alum & mag hydroxide-simeth (MAALOX MAX) 400-400-40 MG/5ML suspension Take 10 mLs by mouth every 6 (six) hours as needed for indigestion. 02/26/22   Fatima Blank, MD  famotidine (PEPCID) 40 MG tablet Take 1 tablet (40 mg total) by mouth at bedtime. 03/02/22   Brunetta Jeans, PA-C  levocetirizine Harlow Ohms ALLERGY 24HR) 5 MG tablet  10/04/20   Fox, Historical, MD  lidocaine (XYLOCAINE) 2 % solution Use as directed 10 mLs in the mouth or throat every 6 (six) hours as needed (stomach pain). 02/26/22   Fatima Blank, MD  losartan (COZAAR) 25 MG tablet Take 12.5 mg by mouth daily. 10/05/21   Fox, Historical, MD   losartan (COZAAR) 50 MG tablet Take 50 mg by mouth daily. 11/10/21   Fox, Historical, MD  montelukast (SINGULAIR) 10 MG tablet Take 10 mg by mouth daily. 12/28/21   Fox, Historical, MD  norethindrone (CAMILA) 0.35 MG tablet Take 1 tablet (0.35 mg total) by mouth daily. 04/11/22   Aletha Halim, MD  pantoprazole (PROTONIX) 40 MG tablet Take 1 tablet (40 mg total) by mouth daily. 03/02/22   Brunetta Jeans, PA-C  potassium chloride SA (KLOR-CON M) 20 MEQ tablet Take 1 tablet (20 mEq total) by mouth 2 (two) times daily for 5 days. 12/14/21 12/19/21  Darlina Rumpf, CNM  Prenat-Fe Poly-Methfol-FA-DHA (VITAFOL ULTRA) 29-0.6-0.4-200 MG CAPS Take 1 capsule by mouth daily before breakfast. 05/12/22   Shelly Bombard, MD  escitalopram (LEXAPRO) 10 MG tablet Take 10 mg by mouth daily.    09/02/11  Fox, Historical, MD      Allergies    Clarithromycin, Doxycycline, Bentyl [dicyclomine hcl], Haldol [haloperidol], Meclizine, Morphine and related, Toradol [ketorolac tromethamine], Nitrofurantoin, and Sulfa antibiotics    Review of Systems   Review of Systems  Cardiovascular:  Positive for chest pain.  Gastrointestinal:  Negative for vomiting.    Physical Exam Updated Vital Signs BP (!) 164/93 (BP Location: Right Arm)   Pulse 78   Temp 98.7 F (37.1 C)   Resp 18   Ht '5\' 3"'$  (1.6 m)   Wt 89.8 kg   SpO2 97%   BMI 35.07 kg/m  Physical  Exam Vitals and nursing note reviewed.  Constitutional:      General: She is not in acute distress.    Appearance: She is well-developed. She is not diaphoretic.  HENT:     Head: Normocephalic and atraumatic.     Right Ear: External ear normal.     Left Ear: External ear normal.     Nose: Nose normal.     Mouth/Throat:     Mouth: Mucous membranes are moist.  Eyes:     General: No scleral icterus.    Conjunctiva/sclera: Conjunctivae normal.  Cardiovascular:     Rate and Rhythm: Normal rate and regular rhythm.     Heart sounds: Normal heart  sounds. No murmur heard.    No friction rub. No gallop.  Pulmonary:     Effort: Pulmonary effort is normal. No respiratory distress.     Breath sounds: Normal breath sounds.  Chest:     Chest wall: Tenderness present.    Abdominal:     General: Bowel sounds are normal. There is no distension.     Palpations: Abdomen is soft. There is no mass.     Tenderness: There is no abdominal tenderness. There is no guarding.  Musculoskeletal:     Cervical back: Normal range of motion.     Right lower leg: No edema.     Left lower leg: No edema.     Comments: TTP along the tendon of the medial hamstring insertion  Skin:    General: Skin is warm and dry.  Neurological:     Mental Status: She is alert and oriented to person, place, and time.  Psychiatric:        Behavior: Behavior normal.     ED Results / Procedures / Treatments   Labs (all labs ordered are listed, but only abnormal results are displayed) Labs Reviewed  BASIC METABOLIC PANEL - Abnormal; Notable for the following components:      Result Value   Glucose, Bld 110 (*)    All other components within normal limits  RESP PANEL BY RT-PCR (FLU A&B, COVID) ARPGX2  CBC  HCG, SERUM, QUALITATIVE  D-DIMER, QUANTITATIVE  URINALYSIS, ROUTINE W REFLEX MICROSCOPIC  PREGNANCY, URINE  TROPONIN I (HIGH SENSITIVITY)    EKG EKG Interpretation  Date/Time:  Wednesday May 25 2022 13:30:02 EST Ventricular Rate:  82 PR Interval:  142 QRS Duration: 90 QT Interval:  376 QTC Calculation: 439 R Axis:   75 Text Interpretation: Normal sinus rhythm with sinus arrhythmia Nonspecific ST abnormality Abnormal ECG downsloping st segments in the III and v6 seen on prior No significant change since last tracing Confirmed by Deno Etienne [phone]) on 05/25/2022 1:32:06 PM  Radiology No results found.  Procedures Procedures  {Document cardiac monitor, telemetry assessment procedure when appropriate:1}  Medications Ordered in ED Medications -  No data to display  ED Course/ Medical Decision Making/ A&P Clinical Course as of 05/25/22 1429  Wed May 25, 2022  6045 Basic metabolic panel(!) [AH]  4098 Glucose(!): 110 [AH]  1429 hCG, serum, qualitative [AH]  1429 Troponin I (High Sensitivity) [AH]    Clinical Course User Index [AH] Margarita Mail, PA-C                           Medical Decision Making Amount and/or Complexity of Data Reviewed Labs: ordered. Radiology: ordered.   ***  {Document critical care time when appropriate:1} {Document review of labs and clinical decision tools ie  heart score, Chads2Vasc2 etc:1}  {Document your independent review of radiology images, and any outside records:1} {Document your discussion with family members, caretakers, and with consultants:1} {Document social determinants of health affecting pt's care:1} {Document your decision making why or why not admission, treatments were needed:1} Final Clinical Impression(s) / ED Diagnoses Final diagnoses:  None    Rx / DC Orders ED Discharge Orders     None

## 2022-05-25 NOTE — ED Triage Notes (Signed)
Patient here POV from Home.  Endorses Last PM the Patient noted Left Posterior leg Pain that was followed by CP that has continued today. Also notes a Cough and Fatigue today.   Some SOB as well.   NAD Noted during Triage. A&Ox4. GCS 15. Ambulatory.

## 2022-05-25 NOTE — Discharge Instructions (Signed)

## 2022-06-01 ENCOUNTER — Telehealth: Payer: Self-pay | Admitting: Licensed Clinical Social Worker

## 2022-06-01 NOTE — Patient Outreach (Signed)
  Care Coordination   06/01/2022 Name: Christina Fox MRN: [id number] DOB: [birth year]   Care Coordination Outreach Attempts:  An unsuccessful telephone outreach was attempted today to offer the patient information about available care coordination services as a benefit of their health plan.   Follow Up Plan:  Additional outreach attempts will be made to offer the patient care coordination information and services.   Encounter Outcome:  No Answer   Care Coordination Interventions:  No, not indicated    [name] See, MSW, [email or web address] Phone [phone] 3:59 PM

## 2022-06-14 ENCOUNTER — Telehealth: Payer: Medicaid Other | Admitting: Physician Assistant

## 2022-06-14 DIAGNOSIS — T3695XA Adverse effect of unspecified systemic antibiotic, initial encounter: Secondary | ICD-10-CM | POA: Diagnosis not present

## 2022-06-14 DIAGNOSIS — B379 Candidiasis, unspecified: Secondary | ICD-10-CM | POA: Diagnosis not present

## 2022-06-15 ENCOUNTER — Telehealth: Payer: Medicaid Other | Admitting: Physician Assistant

## 2022-06-15 ENCOUNTER — Ambulatory Visit
Admission: EM | Admit: 2022-06-15 | Discharge: 2022-06-15 | Disposition: A | Discharge status: Home / Self Care | Payer: Medicaid Other | Attending: Family Medicine | Admitting: Family Medicine

## 2022-06-15 ENCOUNTER — Encounter: Payer: Self-pay | Admitting: Emergency Medicine

## 2022-06-15 DIAGNOSIS — K122 Cellulitis and abscess of mouth: Secondary | ICD-10-CM | POA: Diagnosis not present

## 2022-06-15 DIAGNOSIS — J039 Acute tonsillitis, unspecified: Secondary | ICD-10-CM

## 2022-06-15 LAB — POCT RAPID STREP A (OFFICE): Rapid Strep A Screen: NEGATIVE

## 2022-06-15 MED ORDER — PREDNISONE 20 MG PO TABS: ORAL_TABLET | ORAL | 0 refills | Status: DC

## 2022-06-15 MED ORDER — FLUCONAZOLE 150 MG PO TABS: ORAL_TABLET | ORAL | 0 refills | Status: DC

## 2022-06-15 NOTE — Discharge Instructions (Addendum)
Instructed patient to continue previously prescribed cefdinir and take to completion.  Advised patient to take prednisone with first dose of the previously prescribed cefdinir for the next 5 of 6 days.  Encourage patient to increase daily water intake while taking this medication.  Advised if symptoms worsen and/or unresolved please follow-up with PCP for further evaluation.

## 2022-06-15 NOTE — Progress Notes (Signed)
Because of progressing symptoms despite antibiotic treatment and steroids, I feel your condition warrants further evaluation and I recommend that you be seen in a face to face visit. Since you were just seen by your PCP, I recommend following up with them first thing in the morning.    NOTE: There will be NO CHARGE for this eVisit   If you are having a true medical emergency please call 911.      For an urgent face to face visit, [facility] has seven urgent care centers for your convenience:     [facility] [place] [facility] [place] Get Driving Directions [phone] [place], [place] [address]    [facility]) Get Driving Directions [phone] [place], [place] [address]  [place] [facility] [place] [facility]) Get Driving Directions [phone] [address] [place],  [place]  [address]  [place] [facility] [address] Get Driving Directions  [phone] [address] [place],  [place] [address]   [place] [facility] Get Driving Directions [phone] [place], [place], [place] [address]   [facility] [facility] Get Driving Directions  [phone] [address] [place], [place] [address]   [facility] [place] Get Driving Directions [phone] [address], [place], [place] [address]  Your MyChart E-visit questionnaire answers were reviewed by a board certified advanced clinical practitioner to complete your personal care plan based on your specific symptoms.  Thank you for using e-Visits.

## 2022-06-15 NOTE — Progress Notes (Signed)
E-Visit for Vaginal Symptoms ? ?We are sorry that you are not feeling well. Here is how we plan to help! ?Based on what you shared with me it looks like you: May have a yeast vaginosis ? ?Vaginosis is an inflammation of the vagina that can result in discharge, itching and pain. The cause is usually a change in the normal balance of vaginal bacteria or an infection. Vaginosis can also result from reduced estrogen levels after menopause. ? ?The most common causes of vaginosis are: ? ? Bacterial vaginosis which results from an overgrowth of one on several organisms that are normally present in your vagina. ? ? Yeast infections which are caused by a naturally occurring fungus called candida. ? ? Vaginal atrophy (atrophic vaginosis) which results from the thinning of the vagina from reduced estrogen levels after menopause. ? ? Trichomoniasis which is caused by a parasite and is commonly transmitted by sexual intercourse. ? ?Factors that increase your risk of developing vaginosis include: ?Medications, such as antibiotics and steroids ?Uncontrolled diabetes ?Use of hygiene products such as bubble bath, vaginal spray or vaginal deodorant ?Douching ?Wearing damp or tight-fitting clothing ?Using an intrauterine device (IUD) for birth control ?Hormonal changes, such as those associated with pregnancy, birth control pills or menopause ?Sexual activity ?Having a sexually transmitted infection ? ?Your treatment plan is Diflucan (fluconazole) 150mg tablet once, repeating in 3 days.  I have electronically sent this prescription into the pharmacy that you have chosen. ? ?Be sure to take all of the medication as directed. Stop taking any medication if you develop a rash, tongue swelling or shortness of breath. Mothers who are breast feeding should consider pumping and discarding their breast milk while on these antibiotics. However, there is no consensus that infant exposure at these doses would be harmful.  ?Remember that  medication creams can weaken latex condoms. ?. ? ? ?HOME CARE: ? ?Good hygiene may prevent some types of vaginosis from recurring and may relieve some symptoms: ? ?Avoid baths, hot tubs and whirlpool spas. Rinse soap from your outer genital area after a shower, and dry the area well to prevent irritation. Don't use scented or harsh soaps, such as those with deodorant or antibacterial action. ?Avoid irritants. These include scented tampons and pads. ?Wipe from front to back after using the toilet. Doing so avoids spreading fecal bacteria to your vagina. ? ?Other things that may help prevent vaginosis include: ? ?Don't douche. Your vagina doesn't require cleansing other than normal bathing. Repetitive douching disrupts the normal organisms that reside in the vagina and can actually increase your risk of vaginal infection. Douching won't clear up a vaginal infection. ?Use a latex condom. Both female and female latex condoms may help you avoid infections spread by sexual contact. ?Wear cotton underwear. Also wear pantyhose with a cotton crotch. If you feel comfortable without it, skip wearing underwear to bed. Yeast thrives in moist environments ?Your symptoms should improve in the next day or two. ? ?GET HELP RIGHT AWAY IF: ? ?You have pain in your lower abdomen ( pelvic area or over your ovaries) ?You develop nausea or vomiting ?You develop a fever ?Your discharge changes or worsens ?You have persistent pain with intercourse ?You develop shortness of breath, a rapid pulse, or you faint. ? ?These symptoms could be signs of problems or infections that need to be evaluated by a medical provider now. ? ?MAKE SURE YOU  ? ?Understand these instructions. ?Will watch your condition. ?Will get help right away if you   are not doing well or get worse. ? ?Thank you for choosing an e-visit. ? ?Your e-visit answers were reviewed by a board certified advanced clinical practitioner to complete your personal care plan. Depending upon the  condition, your plan could have included both over the counter or prescription medications. ? ?Please review your pharmacy choice. Make sure the pharmacy is open so you can pick up prescription now. If there is a problem, you may contact your provider through MyChart messaging and have the prescription routed to another pharmacy.  Your safety is important to us. If you have drug allergies check your prescription carefully.  ? ?For the next 24 hours you can use MyChart to ask questions about today's visit, request a non-urgent call back, or ask for a work or school excuse. ?You will get an email in the next two days asking about your experience. I hope that your e-visit has been valuable and will speed your recovery. ? ?

## 2022-06-15 NOTE — ED Provider Notes (Signed)
Christina Fox CARE    CSN: [id number] Arrival date & time: 06/15/22  1831      History   Chief Complaint Chief Complaint  Patient presents with   Sore Throat    Uvulitis not improving with treatment - Entered by patient    HPI Christina Fox is a 41 y.o. female.   HPI 41 year old female presents that she was diagnosed with uvulitis on Sunday and given cefdinir 300 mg for 10 days and does not feel any better patient did not get steroid injection on Monday at [facility]. PMH significant for PID, headache and anxiety.  Past Medical History:  Diagnosis Date   Abdominal pain    "right lower quadrant pain and right lower back pain   Anxiety    Deviated septum 09/2011   GERD (gastroesophageal reflux disease)    Headache(784.0)    sinus   History of echocardiogram    Echo 11/17: EF 55-60, no RWMA, normal diastolic function   Hypertension    no meds   Kidney stones    no current problem   Migraine    Nasal turbinate hypertrophy 09/2011   bilat.   Pelvic inflammatory disease    Urticaria     Patient Active Problem List   Diagnosis Date Noted   Elevated ALT measurement 12/30/2021   Upper airway cough syndrome 07/08/2020   Gallstone 01/07/2016   History of colonic polyps 11/27/2015   Right lower quadrant abdominal pain 11/27/2015   HTN (hypertension), benign 11/24/2015   Abdominal pain 11/16/2015   Headache 10/13/2015   Thyromegaly 11/03/2014   Anxiety disorder 11/03/2014    Past Surgical History:  Procedure Laterality Date   CHOLECYSTECTOMY N/A 01/15/2016   Procedure: LAPAROSCOPIC CHOLECYSTECTOMY WITH INTRAOPERATIVE CHOLANGIOGRAM;  Surgeon: Christene Lye, MD;  Location: [facility] ORS;  Service: General;  Laterality: N/A;   COLONOSCOPY  2007   [facility]: pedunculated 41m polyp in distal sigmoid, sessile 220mpolyp at splenic flexure, larger polyp tubulovillous adenoma and smaller polyp tubular adenoma    COLONOSCOPY  09-09-15   COLONOSCOPY WITH  PROPOFOL N/A 03/01/2016   Procedure: COLONOSCOPY WITH PROPOFOL;  Surgeon: MaGarlan FairMD;  Location: [facility];  Service: Endoscopy;  Laterality: N/A;   NASAL SEPTOPLASTY W/ TURBINOPLASTY  10/04/2011   Procedure: NASAL SEPTOPLASTY WITH TURBINATE REDUCTION;  Surgeon: SuAscencion DikeMD;  [facility]: [facility] Service: ENT;  Laterality: Bilateral;   WISDOM TOOTH EXTRACTION  2009    OB History     Gravida  6   Para  4   Term  4   Preterm      AB  1   Living  4      SAB  1   IAB      Ectopic      Multiple      Live Births  4        Obstetric Comments  1st Menstrual Cycle:  10  1st Pregnancy:  16           Home Medications    Prior to Admission medications   Medication Sig Start Date End Date Taking? Authorizing Provider  predniSONE (DELTASONE) 20 MG tablet Take 3 tabs PO daily x 5 days. 06/15/22  Yes RaEliezer LoftsFNP  alum & mag hydroxide-simeth (MAALOX MAX) 40287-867-67G/5ML suspension Take 10 mLs by mouth every 6 (six) hours as needed for indigestion. 02/26/22   CaFatima BlankMD  famotidine (PEPCID) 40 MG tablet  Take 1 tablet (40 mg total) by mouth at bedtime. 03/02/22   Brunetta Jeans, PA-C  fluconazole (DIFLUCAN) 150 MG tablet Take 1 tablet PO once. Repeat in 3 days if needed. 06/15/22   Brunetta Jeans, PA-C  levocetirizine Harlow Ohms ALLERGY 24HR) 5 MG tablet  10/04/20   [provider]  lidocaine (XYLOCAINE) 2 % solution Use as directed 10 mLs in the mouth or throat every 6 (six) hours as needed (stomach pain). 02/26/22   Fatima Blank, MD  losartan (COZAAR) 25 MG tablet Take 12.5 mg by mouth daily. 10/05/21   [provider]  losartan (COZAAR) 50 MG tablet Take 50 mg by mouth daily. 11/10/21   [provider]  montelukast (SINGULAIR) 10 MG tablet Take 10 mg by mouth daily. 12/28/21   [provider]  norethindrone (CAMILA) 0.35 MG tablet Take 1 tablet (0.35 mg total) by mouth daily. 04/11/22    Aletha Halim, MD  pantoprazole (PROTONIX) 40 MG tablet Take 1 tablet (40 mg total) by mouth daily. 03/02/22   Brunetta Jeans, PA-C  potassium chloride SA (KLOR-CON M) 20 MEQ tablet Take 1 tablet (20 mEq total) by mouth 2 (two) times daily for 5 days. 12/14/21 12/19/21  Darlina Rumpf, CNM  Prenat-Fe Poly-Methfol-FA-DHA (VITAFOL ULTRA) 29-0.6-0.4-200 MG CAPS Take 1 capsule by mouth daily before breakfast. 05/12/22   Shelly Bombard, MD  escitalopram (LEXAPRO) 10 MG tablet Take 10 mg by mouth daily.    09/02/11  [provider]    Family History Family History  Problem Relation Age of Onset   Kidney cancer Mother    Hypertension Mother    Migraines Mother    Multiple sclerosis Mother    Fibromyalgia Mother    Liver disease Mother        NASH    Hypertension Father    Liver disease Father        liver transplant; had fatty liver disease   Spina bifida Brother    Migraines Maternal Grandmother    Hypertension Other    Colon cancer Paternal Grandfather    Allergic rhinitis Daughter    Asthma Daughter    Allergic rhinitis Son    Asthma Son    Angioedema Neg Hx    Eczema Neg Hx    Immunodeficiency Neg Hx    Urticaria Neg Hx     Social History Social History   Tobacco Use   Smoking status: Former    Packs/day: 0.25    Years: 10.00    Total pack years: 2.50    Types: Cigarettes   Smokeless tobacco: Never   Tobacco comments:    2-3 cigarettes a day  Vaping Use   Vaping Use: Never used  Substance Use Topics   Alcohol use: Yes    Alcohol/week: 0.0 standard drinks of alcohol    Comment: ocassionally   Drug use: No     Allergies   Clarithromycin, Doxycycline, Bentyl [dicyclomine hcl], Haldol [haloperidol], Meclizine, Morphine and related, Toradol [ketorolac tromethamine], Nitrofurantoin, and Sulfa antibiotics   Review of Systems Review of Systems   Physical Exam Triage Vital Signs ED Triage Vitals  Enc Vitals Group     BP 06/15/22 1854 (!)  187/85     Pulse Rate 06/15/22 1854 61     Resp 06/15/22 1854 18     Temp 06/15/22 1854 98.4 F (36.9 C)     Temp Source 06/15/22 1854 Oral     SpO2 06/15/22 1854 97 %  Weight 06/15/22 1857 188 lb (85.3 kg)     Height 06/15/22 1857 '5\' 3"'$  (1.6 m)     Head Circumference --      Peak Flow --      Pain Score 06/15/22 1856 10     Pain Loc --      Pain Edu? --      Excl. in [place]? --    No data found.  Updated Vital Signs BP (!) 187/85 (BP Location: Left Arm)   Pulse 61   Temp 98.4 F (36.9 C) (Oral)   Resp 18   Ht '5\' 3"'$  (1.6 m)   Wt 188 lb (85.3 kg)   LMP 06/02/2022 (Exact Date)   SpO2 97%   BMI 33.30 kg/m   Physical Exam Vitals and nursing note reviewed.  Constitutional:      Appearance: She is well-developed. She is obese.  HENT:     Head: Normocephalic and atraumatic.     Right Ear: Tympanic membrane and ear canal normal.     Left Ear: Tympanic membrane and ear canal normal.     Mouth/Throat:     Mouth: Mucous membranes are moist.     Pharynx: Oropharynx is clear. Uvula midline. Posterior oropharyngeal erythema present.  Eyes:     Conjunctiva/sclera: Conjunctivae normal.     Pupils: Pupils are equal, round, and reactive to light.  Cardiovascular:     Rate and Rhythm: Normal rate and regular rhythm.     Heart sounds: Normal heart sounds.  Pulmonary:     Effort: Pulmonary effort is normal.     Breath sounds: Normal breath sounds. No wheezing, rhonchi or rales.  Musculoskeletal:     Cervical back: Normal range of motion and neck supple.  Skin:    General: Skin is warm and dry.  Neurological:     General: No focal deficit present.     Mental Status: She is alert and oriented to person, place, and time.      UC Treatments / Results  Labs (all labs ordered are listed, but only abnormal results are displayed) Labs Reviewed  POCT RAPID STREP A (OFFICE)    EKG   Radiology No results found.  Procedures Procedures (including critical care  time)  Medications Ordered in UC Medications - No data to display  Initial Impression / Assessment and Plan / UC Course  I have reviewed the triage vital signs and the nursing notes.  Pertinent labs & imaging results that were available during my care of the patient were reviewed by me and considered in my medical decision making (see chart for details).     MDM: 1.  Uvulitis-Rx'd prednisone. Instructed patient to continue previously prescribed cefdinir and take to completion.  Advised patient to take prednisone with first dose of the previously prescribed cefdinir for the next 5 of 6 days.  Encourage patient to increase daily water intake while taking this medication.  Advised if symptoms worsen and/or unresolved please follow-up with PCP for further evaluation.  Final Clinical Impressions(s) / UC Diagnoses   Final diagnoses:  Uvulitis     Discharge Instructions      Instructed patient to continue previously prescribed cefdinir and take to completion.  Advised patient to take prednisone with first dose of the previously prescribed cefdinir for the next 5 of 6 days.  Encourage patient to increase daily water intake while taking this medication.  Advised if symptoms worsen and/or unresolved please follow-up with PCP for further evaluation.  ED Prescriptions     Medication Sig Dispense Auth. Provider   predniSONE (DELTASONE) 20 MG tablet Take 3 tabs PO daily x 5 days. 15 tablet Eliezer Lofts, FNP      PDMP not reviewed this encounter.   Eliezer Lofts, [place] 06/15/22 1935

## 2022-06-15 NOTE — Progress Notes (Signed)
I have spent 5 minutes in review of e-visit questionnaire, review and updating patient chart, medical decision making and response to patient.   Jaliah Foody Cody Heidemarie Goodnow, PA-C    

## 2022-06-15 NOTE — ED Triage Notes (Signed)
Patient states that she was diagnosed with Uvulitis on Sunday.  Given Cefdinir '300mg'$  bid for 10 days.  Patient doesn't feel that she is getting any better.  Pt did get a steroid injection on Monday @ [facility].

## 2022-06-22 ENCOUNTER — Telehealth: Payer: Medicaid Other | Admitting: Family Medicine

## 2022-06-22 DIAGNOSIS — H539 Unspecified visual disturbance: Secondary | ICD-10-CM | POA: Diagnosis not present

## 2022-06-22 DIAGNOSIS — T380X5A Adverse effect of glucocorticoids and synthetic analogues, initial encounter: Secondary | ICD-10-CM | POA: Diagnosis not present

## 2022-06-22 NOTE — Progress Notes (Signed)
Virtual Visit Consent   Christina Fox, you are scheduled for a virtual visit with a [facility] provider today. Just as with appointments in the office, your consent must be obtained to participate. Your consent will be active for this visit and any virtual visit you may have with one of our providers in the next 365 days. If you have a MyChart account, a copy of this consent can be sent to you electronically.  As this is a virtual visit, video technology does not allow for your provider to perform a traditional examination. This may limit your provider's ability to fully assess your condition. If your provider identifies any concerns that need to be evaluated in person or the need to arrange testing (such as labs, EKG, etc.), we will make arrangements to do so. Although advances in technology are sophisticated, we cannot ensure that it will always work on either your end or our end. If the connection with a video visit is poor, the visit may have to be switched to a telephone visit. With either a video or telephone visit, we are not always able to ensure that we have a secure connection.  By engaging in this virtual visit, you consent to the provision of healthcare and authorize for your insurance to be billed (if applicable) for the services provided during this visit. Depending on your insurance coverage, you may receive a charge related to this service.  I need to obtain your verbal consent now. Are you willing to proceed with your visit today? Myda A Jaskowiak has provided verbal consent on 06/22/2022 for a virtual visit (video or telephone). Dellia Nims, FNP  Date: 06/22/2022 8:52 PM  Virtual Visit via Video Note   I, Dellia Nims, connected with  Christina Fox  ([id number], Aug 22, 1980) on 06/22/22 at  9:30 PM EST by a video-enabled telemedicine application and verified that I am speaking with the correct person using two identifiers.  Location: Patient: Virtual Visit Location Patient:  Home Provider: Virtual Visit Location Provider: Home Office   I discussed the limitations of evaluation and management by telemedicine and the availability of in person appointments. The patient expressed understanding and agreed to proceed.    History of Present Illness: Christina Fox is a 41 y.o. who identifies as a female who was assigned female at birth, and is being seen today for vision changes- hard to describe she says but somewhat blurred. Went to eye doctor yesterday and got new set of contact lenses. She says sx started when she took prednisone 60 mg daily for 5 days and finished it Monday. She is concerned it is a side effect of the prednisone. Marland Kitchen  HPI: HPI  Problems:  Patient Active Problem List   Diagnosis Date Noted   Elevated ALT measurement 12/30/2021   Upper airway cough syndrome 07/08/2020   Gallstone 01/07/2016   History of colonic polyps 11/27/2015   Right lower quadrant abdominal pain 11/27/2015   HTN (hypertension), benign 11/24/2015   Abdominal pain 11/16/2015   Headache 10/13/2015   Thyromegaly 11/03/2014   Anxiety disorder 11/03/2014    Allergies:  Allergies  Allergen Reactions   Clarithromycin Hives and Itching   Doxycycline Shortness Of Breath    Chest pain   Bentyl [Dicyclomine Hcl] Other (See Comments)    DIZZINESS   Haldol [Haloperidol] Other (See Comments)    Made her feel very jittery and agitated   Meclizine     Chest pain   Morphine And Related Other (See  Comments)    Made her feel like she was losing her mind   Toradol [Ketorolac Tromethamine] Other (See Comments)    Made her feel very jittery and agitated   Nitrofurantoin Rash   Sulfa Antibiotics Hives and Rash   Medications:  Current Outpatient Medications:    alum & mag hydroxide-simeth (MAALOX MAX) 400-400-40 MG/5ML suspension, Take 10 mLs by mouth every 6 (six) hours as needed for indigestion., Disp: 355 mL, Rfl: 0   famotidine (PEPCID) 40 MG tablet, Take 1 tablet (40 mg total)  by mouth at bedtime., Disp: 15 tablet, Rfl: 0   fluconazole (DIFLUCAN) 150 MG tablet, Take 1 tablet PO once. Repeat in 3 days if needed., Disp: 2 tablet, Rfl: 0   levocetirizine (XYZAL ALLERGY 24HR) 5 MG tablet, , Disp: , Rfl:    lidocaine (XYLOCAINE) 2 % solution, Use as directed 10 mLs in the mouth or throat every 6 (six) hours as needed (stomach pain)., Disp: 100 mL, Rfl: 0   losartan (COZAAR) 25 MG tablet, Take 12.5 mg by mouth daily., Disp: , Rfl:    losartan (COZAAR) 50 MG tablet, Take 50 mg by mouth daily., Disp: , Rfl:    montelukast (SINGULAIR) 10 MG tablet, Take 10 mg by mouth daily., Disp: , Rfl:    norethindrone (CAMILA) 0.35 MG tablet, Take 1 tablet (0.35 mg total) by mouth daily., Disp: 60 tablet, Rfl: 2   pantoprazole (PROTONIX) 40 MG tablet, Take 1 tablet (40 mg total) by mouth daily., Disp: 30 tablet, Rfl: 0   potassium chloride SA (KLOR-CON M) 20 MEQ tablet, Take 1 tablet (20 mEq total) by mouth 2 (two) times daily for 5 days., Disp: 10 tablet, Rfl: 0   predniSONE (DELTASONE) 20 MG tablet, Take 3 tabs PO daily x 5 days., Disp: 15 tablet, Rfl: 0   Prenat-Fe Poly-Methfol-FA-DHA (VITAFOL ULTRA) 29-0.6-0.4-200 MG CAPS, Take 1 capsule by mouth daily before breakfast., Disp: 90 capsule, Rfl: 4  Observations/Objective: Patient is well-developed, well-nourished in no acute distress.  Resting comfortably  at home.  Head is normocephalic, atraumatic.  No labored breathing.  Speech is clear and coherent with logical content.  Patient is alert and oriented at baseline.    Assessment and Plan: 1. Vision changes  2. Adverse effect of prednisone, initial encounter  Increase fluids, keep ophthalmology apptmt  Friday if sx worsen, flush prednisone with fluids as this may be a side effect of prednisone.   Follow Up Instructions: I discussed the assessment and treatment plan with the patient. The patient was provided an opportunity to ask questions and all were answered. The patient  agreed with the plan and demonstrated an understanding of the instructions.  A copy of instructions were sent to the patient via MyChart unless otherwise noted below.     The patient was advised to call back or seek an in-person evaluation if the symptoms worsen or if the condition fails to improve as anticipated.  Time:  I spent 10 minutes with the patient via telehealth technology discussing the above problems/concerns.    Dellia Nims, FNP

## 2022-06-22 NOTE — Patient Instructions (Signed)
Blurred Vision, Adult     Having blurred vision means that you cannot see things clearly. Your vision may seem fuzzy or out of focus. It can involve your vision for objects that are close or far away. It may affect one or both eyes. There are many causes of blurred vision, including cataracts, macular degeneration, eye inflammation (uveitis), and diabetic retinopathy. In many cases, blurred vision has to do with the shape of your eye. An abnormal eye shape means you cannot focus well (refractive error). When this happens, it can cause: Faraway objects to look blurry (nearsightedness). Close objects to look blurry (farsightedness). Blurry vision at any distance (astigmatism). Refractive errors are often corrected with glasses or contacts. If you have blurred vision, it is best to see an eye care specialist. Blurred vision can be diagnosed based on your symptoms and a complete eye exam. Tell your eye care specialist about any other health problems you have, any recent eye injury, and any prior surgeries. You may need to see an eye care specialist who specializes in medical and surgical eye problems (ophthalmologist). Your treatment will depend on what is causing your blurred vision. Follow these instructions at home: Keep all follow-up visits. This is important. These include any visits to your eye specialists. Do not drive or use machinery if your vision is blurry. Use eye drops only as told by your health care provider. If you were prescribed glasses or contact lenses, wear the glasses or contacts as told by your health care provider. Schedule eye exams regularly. Pay attention to any changes in your symptoms. Avoid rubbing your eyes. Contact a health care provider if: Your symptoms do not improve or they get worse. You have: New symptoms. A headache. Trouble seeing at night. Trouble noticing the difference between colors. You notice: Drooping of your eyelids. Drainage coming from your  eyes. A rash around your eyes. Get help right away if: You have: Severe eye pain. A severe headache. A sudden change in vision. A sudden loss of vision. A vision change after an injury. You notice flashing lights in your field of vision. Your field of vision is the area that you can see without moving your eyes. Summary Having blurred vision means that you cannot see things clearly. Your vision may seem fuzzy or out of focus. There are many causes of blurred vision. In many cases, blurred vision has to do with an abnormal eye shape (refractive error), and it can be corrected with glasses or contact lenses. Pay attention to any changes in your symptoms. Contact a health care provider if your symptoms do not improve or if you have any new symptoms. This information is not intended to replace advice given to you by your health care provider. Make sure you discuss any questions you have with your health care provider. Document Revised: 10/13/2020 Document Reviewed: 10/13/2020 Elsevier Patient Education  [place].

## 2022-06-27 ENCOUNTER — Telehealth: Payer: Medicaid Other | Admitting: Physician Assistant

## 2022-06-27 DIAGNOSIS — R6889 Other general symptoms and signs: Secondary | ICD-10-CM | POA: Diagnosis not present

## 2022-06-27 MED ORDER — OSELTAMIVIR PHOSPHATE 75 MG PO CAPS: 75.0000 mg | ORAL_CAPSULE | Freq: Two times a day (BID) | ORAL | 0 refills | Status: DC

## 2022-06-27 MED ORDER — ONDANSETRON 4 MG PO TBDP: 4.0000 mg | ORAL_TABLET | Freq: Three times a day (TID) | ORAL | 0 refills | Status: DC | PRN

## 2022-06-27 NOTE — Progress Notes (Signed)
E visit for Flu like symptoms   We are sorry that you are not feeling well.  Here is how we plan to help! Based on what you have shared with me it looks like you may have a respiratory virus that may be influenza.  Influenza or "the flu" is   an infection caused by a respiratory virus. The flu virus is highly contagious and persons who did not receive their yearly flu vaccination may "catch" the flu from close contact.  We have anti-viral medications to treat the viruses that cause this infection. They are not a "cure" and only shorten the course of the infection. These prescriptions are most effective when they are given within the first 2 days of "flu" symptoms. Antiviral medication are indicated if you have a high risk of complications from the flu. You should  also consider an antiviral medication if you are in close contact with someone who is at risk. These medications can help patients avoid complications from the flu  but have side effects that you should know. Possible side effects from Tamiflu or oseltamivir include nausea, vomiting, diarrhea, dizziness, headaches, eye redness, sleep problems or other respiratory symptoms. You should not take Tamiflu if you have an allergy to oseltamivir or any to the ingredients in Tamiflu.  Based upon your symptoms and potential risk factors I have prescribed Oseltamivir (Tamiflu).  It has been sent to your designated pharmacy.  You will take one 75 mg capsule orally twice a day for the next 5 days. And Zofran for nausea and vomiting. Can get Imodium AD OTC if diarrhea is more than 8 BM in a 24 hour period to slow diarrhea.   ANYONE WHO HAS FLU SYMPTOMS SHOULD: Stay home. The flu is highly contagious and going out or to work exposes others! Be sure to drink plenty of fluids. Water is fine as well as fruit juices, sodas and electrolyte beverages. You may want to stay away from caffeine or alcohol. If you are nauseated, try taking small sips of liquids. How  do you know if you are getting enough fluid? Your urine should be a pale yellow or almost colorless. Get rest. Taking a steamy shower or using a humidifier may help nasal congestion and ease sore throat pain. Using a saline nasal spray works much the same way. Cough drops, hard candies and sore throat lozenges may ease your cough. Line up a caregiver. Have someone check on you regularly.   GET HELP RIGHT AWAY IF: You cannot keep down liquids or your medications. You become short of breath Your fell like you are going to pass out or loose consciousness. Your symptoms persist after you have completed your treatment plan MAKE SURE YOU  Understand these instructions. Will watch your condition. Will get help right away if you are not doing well or get worse.  Your e-visit answers were reviewed by a board certified advanced clinical practitioner to complete your personal care plan.  Depending on the condition, your plan could have included both over the counter or prescription medications.  If there is a problem please reply  once you have received a response from your provider.  Your safety is important to [place].  If you have drug allergies check your prescription carefully.    You can use MyChart to ask questions about today's visit, request a non-urgent call back, or ask for a work or school excuse for 24 hours related to this e-Visit. If it has been greater than 24 hours  you will need to follow up with your provider, or enter a new e-Visit to address those concerns.  You will get an e-mail in the next two days asking about your experience.  I hope that your e-visit has been valuable and will speed your recovery. Thank you for using e-visits.  I have spent 5 minutes in review of e-visit questionnaire, review and updating patient chart, medical decision making and response to patient.   Mar Daring, PA-C

## 2022-07-18 ENCOUNTER — Emergency Department (HOSPITAL_BASED_OUTPATIENT_CLINIC_OR_DEPARTMENT_OTHER)
Admission: EM | Admit: 2022-07-18 | Discharge: 2022-07-18 | Disposition: A | Discharge status: Home / Self Care | Payer: Medicaid Other | Attending: Emergency Medicine | Admitting: Emergency Medicine

## 2022-07-18 ENCOUNTER — Other Ambulatory Visit: Payer: Self-pay

## 2022-07-18 ENCOUNTER — Encounter (HOSPITAL_BASED_OUTPATIENT_CLINIC_OR_DEPARTMENT_OTHER): Payer: Self-pay

## 2022-07-18 DIAGNOSIS — N898 Other specified noninflammatory disorders of vagina: Secondary | ICD-10-CM | POA: Insufficient documentation

## 2022-07-18 DIAGNOSIS — M545 Low back pain, unspecified: Secondary | ICD-10-CM | POA: Insufficient documentation

## 2022-07-18 DIAGNOSIS — R102 Pelvic and perineal pain: Secondary | ICD-10-CM | POA: Diagnosis not present

## 2022-07-18 LAB — CBC WITH DIFFERENTIAL/PLATELET
Abs Immature Granulocytes: 0.02 10*3/uL (ref 0.00–0.07)
Basophils Absolute: 0.1 10*3/uL (ref 0.0–0.1)
Basophils Relative: 1 %
Eosinophils Absolute: 0.5 10*3/uL (ref 0.0–0.5)
Eosinophils Relative: 5 %
HCT: 38.6 % (ref 36.0–46.0)
Hemoglobin: 12.8 g/dL (ref 12.0–15.0)
Immature Granulocytes: 0 %
Lymphocytes Relative: 27 %
Lymphs Abs: 2.4 10*3/uL (ref 0.7–4.0)
MCH: 29.6 pg (ref 26.0–34.0)
MCHC: 33.2 g/dL (ref 30.0–36.0)
MCV: 89.1 fL (ref 80.0–100.0)
Monocytes Absolute: 0.9 10*3/uL (ref 0.1–1.0)
Monocytes Relative: 11 %
Neutro Abs: 5 10*3/uL (ref 1.7–7.7)
Neutrophils Relative %: 56 %
Platelets: 368 10*3/uL (ref 150–400)
RBC: 4.33 MIL/uL (ref 3.87–5.11)
RDW: 13.2 % (ref 11.5–15.5)
WBC: 8.9 10*3/uL (ref 4.0–10.5)
nRBC: 0 % (ref 0.0–0.2)

## 2022-07-18 LAB — URINALYSIS, ROUTINE W REFLEX MICROSCOPIC
Bilirubin Urine: NEGATIVE
Glucose, UA: NEGATIVE mg/dL
Hgb urine dipstick: NEGATIVE
Ketones, ur: NEGATIVE mg/dL
Nitrite: NEGATIVE
Protein, ur: NEGATIVE mg/dL
Specific Gravity, Urine: >=1.03 (ref 1.005–1.030)
pH: 6 (ref 5.0–8.0)

## 2022-07-18 LAB — BASIC METABOLIC PANEL
Anion gap: 7 (ref 5–15)
BUN: 13 mg/dL (ref 6–20)
CO2: 24 mmol/L (ref 22–32)
Calcium: 8.8 mg/dL — ABNORMAL LOW (ref 8.9–10.3)
Chloride: 104 mmol/L (ref 98–111)
Creatinine, Ser: 0.94 mg/dL (ref 0.44–1.00)
GFR, Estimated: >60 mL/min (ref >60)
Glucose, Bld: 90 mg/dL (ref 70–99)
Potassium: 3.4 mmol/L — ABNORMAL LOW (ref 3.5–5.1)
Sodium: 135 mmol/L (ref 135–145)

## 2022-07-18 LAB — URINALYSIS, MICROSCOPIC (REFLEX)

## 2022-07-18 LAB — WET PREP, GENITAL
Sperm: NONE SEEN
Trich, Wet Prep: NONE SEEN
WBC, Wet Prep HPF POC: >=10 — AB (ref <10)

## 2022-07-18 LAB — PREGNANCY, URINE: Preg Test, Ur: NEGATIVE

## 2022-07-18 MED ORDER — FLUCONAZOLE 150 MG PO TABS: 150.0000 mg | ORAL_TABLET | Freq: Once | ORAL | Status: DC

## 2022-07-18 MED ORDER — FLUCONAZOLE 150 MG PO TABS: 150.0000 mg | ORAL_TABLET | Freq: Every day | ORAL | 0 refills | Status: DC

## 2022-07-18 MED ORDER — LIDOCAINE HCL (PF) 1 % IJ SOLN
1.0000 mL | Freq: Once | INTRAMUSCULAR | Status: AC
  Administered 2022-07-18: 1.5 mL
  Filled 2022-07-18: qty 5

## 2022-07-18 MED ORDER — CEFTRIAXONE SODIUM 500 MG IJ SOLR
500.0000 mg | Freq: Once | INTRAMUSCULAR | Status: AC
  Administered 2022-07-18: 500 mg via INTRAMUSCULAR
  Filled 2022-07-18: qty 500

## 2022-07-18 NOTE — ED Provider Triage Note (Signed)
Emergency Medicine Provider Triage Evaluation Note  Christina Fox , a 43 y.o. female  was evaluated in triage.  Pt complains of right flank pain for the last 4 days with dysuria, right lower quadrant pain, and a white-yellow vaginal discharge. No fever, chills, nausea, vomiting, chest pain, or SOB. New sexual partner x 3 months. Has not been tested for STDs. Requested testing. Remote history of PID.   Review of Systems  Positive: See HPI Negative: See HPI  Physical Exam  BP 133/84 (BP Location: Left Arm)   Pulse 76   Temp 98.5 F (36.9 C) (Oral)   Resp 20   Ht '5\' 3"'$  (1.6 m)   Wt 85.7 kg   SpO2 100%   BMI 33.48 kg/m  Gen:   Awake, no distress   Resp:  Normal effort  MSK:   Moves extremities without difficulty  Other:  Right CVA tenderness, remainder of abdomen soft and nontender, no rigidity, no peritoneal signs  Medical Decision Making  Medically screening exam initiated at 6:16 PM.  Appropriate orders placed.  Christina Fox was informed that the remainder of the evaluation will be completed by another provider, this initial triage assessment does not replace that evaluation, and the importance of remaining in the ED until their evaluation is complete.     Suzzette Righter, PA-C 07/18/22 1818

## 2022-07-18 NOTE — ED Provider Notes (Signed)
[facility] Provider Note   CSN: [id number] Arrival date & time: 07/18/22  1647     History  Chief Complaint  Patient presents with   Flank Pain    Christina Fox is a 42 y.o. female.   Flank Pain     Pt has been having pain in the low back and pelvic area.  Pt denies vomiting or diarrhea.  She has noticed some vaginal discharge.  Pain intially in the left pelvic and then went to right.   No dysuria.  Pt had PID in the past and this feels similar.  Home Medications Prior to Admission medications   Medication Sig Start Date End Date Taking? Authorizing Provider  fluconazole (DIFLUCAN) 150 MG tablet Take 1 tablet (150 mg total) by mouth daily. 07/18/22  Yes Dorie Rank, MD  famotidine (PEPCID) 40 MG tablet Take 1 tablet (40 mg total) by mouth at bedtime. 03/02/22   Brunetta Jeans, PA-C  fluconazole (DIFLUCAN) 150 MG tablet Take 1 tablet PO once. Repeat in 3 days if needed. 06/15/22   Brunetta Jeans, PA-C  levocetirizine Harlow Ohms ALLERGY 24HR) 5 MG tablet  10/04/20   [provider]  losartan (COZAAR) 50 MG tablet Take 50 mg by mouth daily. 11/10/21   [provider]  montelukast (SINGULAIR) 10 MG tablet Take 10 mg by mouth daily. 12/28/21   [provider]  norethindrone (CAMILA) 0.35 MG tablet Take 1 tablet (0.35 mg total) by mouth daily. 04/11/22   Aletha Halim, MD  ondansetron (ZOFRAN-ODT) 4 MG disintegrating tablet Take 1 tablet (4 mg total) by mouth every 8 (eight) hours as needed for nausea or vomiting. 06/27/22   Mar Daring, PA-C  oseltamivir (TAMIFLU) 75 MG capsule Take 1 capsule (75 mg total) by mouth 2 (two) times daily. 06/27/22   Mar Daring, PA-C  potassium chloride SA (KLOR-CON M) 20 MEQ tablet Take 1 tablet (20 mEq total) by mouth 2 (two) times daily for 5 days. 12/14/21 12/19/21  Darlina Rumpf, CNM  predniSONE (DELTASONE) 20 MG tablet Take 3 tabs PO daily x 5 days.  06/15/22   Eliezer Lofts, FNP  Prenat-Fe Poly-Methfol-FA-DHA (VITAFOL ULTRA) 29-0.6-0.4-200 MG CAPS Take 1 capsule by mouth daily before breakfast. 05/12/22   Shelly Bombard, MD  escitalopram (LEXAPRO) 10 MG tablet Take 10 mg by mouth daily.    09/02/11  [provider]      Allergies    Clarithromycin, Doxycycline, Bentyl [dicyclomine hcl], Haldol [haloperidol], Meclizine, Morphine and related, Toradol [ketorolac tromethamine], Nitrofurantoin, and Sulfa antibiotics    Review of Systems   Review of Systems  Genitourinary:  Positive for flank pain.    Physical Exam Updated Vital Signs BP (!) 157/78 (BP Location: Right Arm)   Pulse 62   Temp 98.4 F (36.9 C) (Oral)   Resp 15   Ht 1.6 m ('5\' 3"'$ )   Wt 85.7 kg   SpO2 95%   BMI 33.48 kg/m  Physical Exam Vitals and nursing note reviewed. Exam conducted with a chaperone present.  Constitutional:      General: She is not in acute distress.    Appearance: She is well-developed.  HENT:     Head: Normocephalic and atraumatic.     Right Ear: External ear normal.     Left Ear: External ear normal.  Eyes:     General: No scleral icterus.       Right eye: No discharge.  Left eye: No discharge.     Conjunctiva/sclera: Conjunctivae normal.  Neck:     Trachea: No tracheal deviation.  Cardiovascular:     Rate and Rhythm: Normal rate and regular rhythm.  Pulmonary:     Effort: Pulmonary effort is normal. No respiratory distress.     Breath sounds: Normal breath sounds. No stridor. No wheezing or rales.  Abdominal:     General: Bowel sounds are normal. There is no distension.     Palpations: Abdomen is soft.     Tenderness: There is no abdominal tenderness. There is no guarding or rebound.     Hernia: There is no hernia in the left inguinal area or right inguinal area.     Comments: Mild ttp suprapubic region  Genitourinary:    Pubic Area: No rash.      Labia:        Right: No rash, lesion or injury.        Left: No  rash, lesion or injury.      Vagina: Vaginal discharge present.     Cervix: No friability or erythema.     Comments: Mild ttp uterus, no masses, no cmt, scant white discharge Musculoskeletal:        General: No tenderness or deformity.     Cervical back: Neck supple.  Skin:    General: Skin is warm and dry.     Findings: No rash.  Neurological:     General: No focal deficit present.     Mental Status: She is alert.     Cranial Nerves: No cranial nerve deficit, dysarthria or facial asymmetry.     Sensory: No sensory deficit.     Motor: No abnormal muscle tone or seizure activity.     Coordination: Coordination normal.  Psychiatric:        Mood and Affect: Mood normal.     ED Results / Procedures / Treatments   Labs (all labs ordered are listed, but only abnormal results are displayed) Labs Reviewed  WET PREP, GENITAL - Abnormal; Notable for the following components:      Result Value   Yeast Wet Prep HPF POC PRESENT (*)    Clue Cells Wet Prep HPF POC PRESENT (*)    WBC, Wet Prep HPF POC >=10 (*)    All other components within normal limits  URINALYSIS, ROUTINE W REFLEX MICROSCOPIC - Abnormal; Notable for the following components:   Leukocytes,Ua TRACE (*)    All other components within normal limits  BASIC METABOLIC PANEL - Abnormal; Notable for the following components:   Potassium 3.4 (*)    Calcium 8.8 (*)    All other components within normal limits  URINALYSIS, MICROSCOPIC (REFLEX) - Abnormal; Notable for the following components:   Bacteria, UA FEW (*)    All other components within normal limits  PREGNANCY, URINE  CBC WITH DIFFERENTIAL/PLATELET  RPR  HIV ANTIBODY (ROUTINE TESTING W REFLEX)  GC/CHLAMYDIA PROBE AMP ([place]) NOT AT [facility]    EKG None  Radiology No results found.  Procedures Procedures    Medications Ordered in ED Medications  cefTRIAXone (ROCEPHIN) injection 500 mg (has no administration in time range)  lidocaine (PF) (XYLOCAINE) 1  % injection 1-2.1 mL (has no administration in time range)    ED Course/ Medical Decision Making/ A&P                             Medical Decision Making  Differential diagnosis includes but not limited to UTI pyelonephritis appendicitis PID  Problems Addressed: Pelvic pain: acute illness or injury Vaginal discharge: acute illness or injury  Amount and/or Complexity of Data Reviewed Labs: ordered.  Risk Prescription drug management.   Patient presented to ED with complaints of pelvic pain and vaginal discharge.  Patient's exam notable for slight discharge but no severe pain or cervical motion tenderness.  Patient concerned about possible recurrent PID.  Will give her a dose of ceftriaxone to cover for gonorrhea.  Patient has allergies to both doxycycline and clarithromycin.  Will hold off on empiric chlamydia treatment until her culture results return.  Patient does have yeast on her wet prep.  This may be the primary source of her vaginal discharge.  Will discharge home with prescription for Diflucan.  Doubt appendicitis UTI or nephritis.  Evaluation and diagnostic testing in the emergency department does not suggest an emergent condition requiring admission or immediate intervention beyond what has been performed at this time.  The patient is safe for discharge and has been instructed to return immediately for worsening symptoms, change in symptoms or any other concerns.        Final Clinical Impression(s) / ED Diagnoses Final diagnoses:  Vaginal discharge  Pelvic pain    Rx / DC Orders ED Discharge Orders          Ordered    fluconazole (DIFLUCAN) 150 MG tablet  Daily        07/18/22 2126              Dorie Rank, MD 07/18/22 2127

## 2022-07-18 NOTE — Discharge Instructions (Addendum)
You were given a dose of antibiotics to treat possible cervicitis.  We also sent off cultures to assess for other infections and we will call you if the culture results are positive.  Follow-up with your primary care doctor or GYN doctor to be rechecked to make sure the symptoms resolved.

## 2022-07-18 NOTE — ED Triage Notes (Signed)
C/o RLQ pain radiating to right flank, states was on left side last week. Also c/o light vaginal discharge.

## 2022-07-19 LAB — HIV ANTIBODY (ROUTINE TESTING W REFLEX): HIV Screen 4th Generation wRfx: NONREACTIVE

## 2022-07-19 LAB — RPR: RPR Ser Ql: NONREACTIVE

## 2022-07-19 LAB — GC/CHLAMYDIA PROBE AMP (~~LOC~~) NOT AT ARMC
Chlamydia: NEGATIVE
Comment: NEGATIVE
Comment: NORMAL
Neisseria Gonorrhea: NEGATIVE

## 2022-07-20 ENCOUNTER — Encounter: Payer: Self-pay | Admitting: Obstetrics

## 2022-07-20 ENCOUNTER — Other Ambulatory Visit (HOSPITAL_COMMUNITY)
Admission: RE | Admit: 2022-07-20 | Discharge: 2022-07-20 | Disposition: A | Discharge status: Home / Self Care | Payer: Medicaid Other | Source: Ambulatory Visit | Attending: Obstetrics | Admitting: Obstetrics

## 2022-07-20 ENCOUNTER — Ambulatory Visit: Payer: Medicaid Other | Admitting: Obstetrics

## 2022-07-20 ENCOUNTER — Other Ambulatory Visit: Payer: Self-pay

## 2022-07-20 VITALS — BP 158/89 | HR 80 | Ht 63.0 in | Wt 189.0 lb

## 2022-07-20 DIAGNOSIS — R102 Pelvic and perineal pain: Secondary | ICD-10-CM | POA: Diagnosis not present

## 2022-07-20 DIAGNOSIS — Z01419 Encounter for gynecological examination (general) (routine) without abnormal findings: Secondary | ICD-10-CM | POA: Insufficient documentation

## 2022-07-20 DIAGNOSIS — B379 Candidiasis, unspecified: Secondary | ICD-10-CM | POA: Diagnosis not present

## 2022-07-20 DIAGNOSIS — N760 Acute vaginitis: Secondary | ICD-10-CM

## 2022-07-20 DIAGNOSIS — B9689 Other specified bacterial agents as the cause of diseases classified elsewhere: Secondary | ICD-10-CM | POA: Diagnosis not present

## 2022-07-20 MED ORDER — METRONIDAZOLE 500 MG PO TABS: 500.0000 mg | ORAL_TABLET | Freq: Two times a day (BID) | ORAL | 2 refills | Status: DC

## 2022-07-20 NOTE — Progress Notes (Unsigned)
Subjective:        Christina Fox is a 42 y.o. female here for a routine exam.  Current complaints: Backache and pelvic pain.  Denies dysuria..    Personal health questionnaire:  Is patient [name], have a family history of breast and/or ovarian cancer: no Is there a family history of uterine cancer diagnosed at age < 56, gastrointestinal cancer, urinary tract cancer, family member who is a Field seismologist syndrome-associated carrier: no Is the patient overweight and hypertensive, family history of diabetes, personal history of gestational diabetes, preeclampsia or PCOS: no Is patient over 66, have PCOS,  family history of premature CHD under age 50, diabetes, smoke, have hypertension or peripheral artery disease:  no At any time, has a partner hit, kicked or otherwise hurt or frightened you?: no Over the past 2 weeks, have you felt down, depressed or hopeless?: no Over the past 2 weeks, have you felt little interest or pleasure in doing things?:no   Gynecologic History Patient's last menstrual period was 06/29/2022 (exact date). Contraception: OCP (estrogen/progesterone) Last Pap: 2022. Results were: normal Last mammogram: 2023. Results were: normal  Obstetric History OB History  Gravida Para Term Preterm AB Living  '6 4 4   1 4  '$ SAB IAB Ectopic Multiple Live Births  1       4    # Outcome Date GA Lbr Len/2nd Weight Sex Delivery Anes PTL Lv  6 Gravida           5 SAB 08/2019          4 Term 12/08/11    F Vag-Spont   LIV  3 Term 12/17/01    M Vag-Spont   LIV  2 Term 10/08/00    M Vag-Spont   LIV  1 Term 07/27/98    F Vag-Spont   LIV    Obstetric Comments  1st Menstrual Cycle:  10   1st Pregnancy:  16    Past Medical History:  Diagnosis Date   Abdominal pain    "right lower quadrant pain and right lower back pain   Anxiety    Deviated septum 09/2011   GERD (gastroesophageal reflux disease)    Headache(784.0)    sinus   History of echocardiogram    Echo 11/17:  EF 55-60, no RWMA, normal diastolic function   Hypertension    no meds   Kidney stones    no current problem   Migraine    Nasal turbinate hypertrophy 09/2011   bilat.   Pelvic inflammatory disease    Urticaria     Past Surgical History:  Procedure Laterality Date   CHOLECYSTECTOMY N/A 01/15/2016   Procedure: LAPAROSCOPIC CHOLECYSTECTOMY WITH INTRAOPERATIVE CHOLANGIOGRAM;  Surgeon: Christene Lye, MD;  Location: [facility];  Service: General;  Laterality: N/A;   COLONOSCOPY  2007   [facility]: pedunculated 53m polyp in distal sigmoid, sessile 242mpolyp at splenic flexure, larger polyp tubulovillous adenoma and smaller polyp tubular adenoma    COLONOSCOPY  09-09-15   COLONOSCOPY WITH PROPOFOL N/A 03/01/2016   Procedure: COLONOSCOPY WITH PROPOFOL;  Surgeon: MaGarlan FairMD;  Location: [facility];  Service: Endoscopy;  Laterality: N/A;   NASAL SEPTOPLASTY W/ TURBINOPLASTY  10/04/2011   Procedure: NASAL SEPTOPLASTY WITH TURBINATE REDUCTION;  Surgeon: SuAscencion DikeMD;  [place] [facility] Service: [facility];  Laterality: Bilateral;   WISDOM TOOTH EXTRACTION  2009     Current Outpatient Medications:    metroNIDAZOLE (FLAGYL) 500 MG  tablet, Take 1 tablet (500 mg total) by mouth 2 (two) times daily., Disp: 14 tablet, Rfl: 2   famotidine (PEPCID) 40 MG tablet, Take 1 tablet (40 mg total) by mouth at bedtime., Disp: 15 tablet, Rfl: 0   fluconazole (DIFLUCAN) 150 MG tablet, Take 1 tablet PO once. Repeat in 3 days if needed., Disp: 2 tablet, Rfl: 0   fluconazole (DIFLUCAN) 150 MG tablet, Take 1 tablet (150 mg total) by mouth daily., Disp: 1 tablet, Rfl: 0   levocetirizine (XYZAL ALLERGY 24HR) 5 MG tablet, , Disp: , Rfl:    losartan (COZAAR) 50 MG tablet, Take 50 mg by mouth daily., Disp: , Rfl:    montelukast (SINGULAIR) 10 MG tablet, Take 10 mg by mouth daily., Disp: , Rfl:    norethindrone (CAMILA) 0.35 MG tablet, Take 1 tablet (0.35 mg total) by mouth daily., Disp: 60 tablet,  Rfl: 2   ondansetron (ZOFRAN-ODT) 4 MG disintegrating tablet, Take 1 tablet (4 mg total) by mouth every 8 (eight) hours as needed for nausea or vomiting., Disp: 20 tablet, Rfl: 0   oseltamivir (TAMIFLU) 75 MG capsule, Take 1 capsule (75 mg total) by mouth 2 (two) times daily., Disp: 10 capsule, Rfl: 0   potassium chloride SA (KLOR-CON M) 20 MEQ tablet, Take 1 tablet (20 mEq total) by mouth 2 (two) times daily for 5 days., Disp: 10 tablet, Rfl: 0   predniSONE (DELTASONE) 20 MG tablet, Take 3 tabs PO daily x 5 days., Disp: 15 tablet, Rfl: 0   Prenat-Fe Poly-Methfol-FA-DHA (VITAFOL ULTRA) 29-0.6-0.4-200 MG CAPS, Take 1 capsule by mouth daily before breakfast., Disp: 90 capsule, Rfl: 4 Allergies  Allergen Reactions   Clarithromycin Hives and Itching   Doxycycline Shortness Of Breath    Chest pain   Bentyl [Dicyclomine Hcl] Other (See Comments)    DIZZINESS   Haldol [Haloperidol] Other (See Comments)    Made her feel very jittery and agitated   Meclizine     Chest pain   Morphine And Related Other (See Comments)    Made her feel like she was losing her mind   Toradol [Ketorolac Tromethamine] Other (See Comments)    Made her feel very jittery and agitated   Nitrofurantoin Rash   Sulfa Antibiotics Hives and Rash    Social History   Tobacco Use   Smoking status: Former    Packs/day: 0.25    Years: 10.00    Total pack years: 2.50    Types: Cigarettes   Smokeless tobacco: Never   Tobacco comments:    2-3 cigarettes a day  Substance Use Topics   Alcohol use: Yes    Alcohol/week: 0.0 standard drinks of alcohol    Comment: ocassionally    Family History  Problem Relation Age of Onset   Kidney cancer Mother    Hypertension Mother    Migraines Mother    Multiple sclerosis Mother    Fibromyalgia Mother    Liver disease Mother        NASH    Hypertension Father    Liver disease Father        liver transplant; had fatty liver disease   Spina bifida Brother    Migraines Maternal  Grandmother    Hypertension Other    Colon cancer Paternal Grandfather    Allergic rhinitis Daughter    Asthma Daughter    Allergic rhinitis Son    Asthma Son    Angioedema Neg Hx    Eczema Neg Hx  Immunodeficiency Neg Hx    Urticaria Neg Hx       Review of Systems  Constitutional: negative for fatigue and weight loss Respiratory: negative for cough and wheezing Cardiovascular: negative for chest pain, fatigue and palpitations Gastrointestinal: negative for abdominal pain and change in bowel habits Musculoskeletal:negative for myalgias Neurological: negative for gait problems and tremors Behavioral/Psych: negative for abusive relationship, depression Endocrine: negative for temperature intolerance    Genitourinary:negative for abnormal menstrual periods, genital lesions, hot flashes, sexual problems and vaginal discharge Integument/breast: negative for breast lump, breast tenderness, nipple discharge and skin lesion(s)    Objective:       BP (!) 158/89 (BP Location: Left Arm, Cuff Size: Normal)   Pulse 80   Ht '5\' 3"'$  (1.6 m)   Wt 189 lb (85.7 kg)   LMP 06/29/2022 (Exact Date)   BMI 33.48 kg/m  General:   Alert and no distress  Skin:   no rash or abnormalities  Lungs:   clear to auscultation bilaterally  Heart:   regular rate and rhythm, S1, S2 normal, no murmur, click, rub or gallop  Breasts:   normal without suspicious masses, skin or nipple changes or axillary nodes  Abdomen:  normal findings: no organomegaly, soft, non-tender and no hernia  Pelvis:  External genitalia: normal general appearance Urinary system: urethral meatus normal and bladder without fullness, nontender Vaginal: normal without tenderness, induration or masses Cervix: normal appearance Adnexa: normal bimanual exam Uterus: anteverted and non-tender, normal size   Lab Review Urine pregnancy test Labs reviewed yes Radiologic studies reviewed yes    Assessment:    1. Encounter for routine  gynecological examination with Papanicolaou smear of cervix Rx: - Cytology - PAP( [facility])  2. Pelvic and perineal pain Rx: - CT ABDOMEN PELVIS W WO CONTRAST; Future  3. BV (bacterial vaginosis) Rx: - metroNIDAZOLE (FLAGYL) 500 MG tablet; Take 1 tablet (500 mg total) by mouth 2 (two) times daily.  Dispense: 14 tablet; Refill: 2  4. Candida albicans infection, treated     Plan:    Education reviewed: calcium supplements, depression evaluation, low fat, low cholesterol diet, safe sex/STD prevention, self breast exams, skin cancer screening, and weight bearing exercise. Contraception: OCP (estrogen/progesterone). Follow up in: 2 weeks.   Meds ordered this encounter  Medications   metroNIDAZOLE (FLAGYL) 500 MG tablet    Sig: Take 1 tablet (500 mg total) by mouth 2 (two) times daily.    Dispense:  14 tablet    Refill:  2   Orders Placed This Encounter  Procedures   CT ABDOMEN PELVIS W WO CONTRAST    This exam should ONLY be ordered for initial diagnosis or follow up of known pancreatic/liver/renal/bladder masses.    Standing Status:   Future    Standing Expiration Date:   07/21/2023    Order Specific Question:   If indicated for the ordered procedure, I authorize the administration of contrast media per Radiology protocol    Answer:   Yes    Order Specific Question:   Does the patient have a contrast media/X-ray dye allergy?    Answer:   No    Order Specific Question:   Is patient pregnant?    Answer:   No    Order Specific Question:   Preferred imaging location?    Answer:   [facility]    Order Specific Question:   Is Oral Contrast requested for this exam?    Answer:   Yes, Per Radiology protocol  Tavish Gettis A. Jodi Mourning MD 07/20/2022

## 2022-07-20 NOTE — Progress Notes (Addendum)
42 y.o GYN presents for AEX/PAP.  Pt had Urinalysis and GC/CT done 07/18/22 +yeast and bacteria.  Pt stated that she was given a Rocephin C/o dysuria, pain in back and lower abdomen 7/10 x 1 week.

## 2022-07-21 ENCOUNTER — Other Ambulatory Visit: Payer: Self-pay | Admitting: Emergency Medicine

## 2022-07-21 MED ORDER — METRONIDAZOLE 0.75 % VA GEL: 1.0000 | Freq: Every day | VAGINAL | 1 refills | Status: DC

## 2022-07-21 MED ORDER — FLUCONAZOLE 150 MG PO TABS: 150.0000 mg | ORAL_TABLET | Freq: Once | ORAL | 0 refills | Status: AC

## 2022-07-21 NOTE — Progress Notes (Signed)
Metro gel Rx for BV. Unable to tolerate oral Rx Diflucan for vaginal itching, discharge

## 2022-07-26 ENCOUNTER — Other Ambulatory Visit: Payer: Self-pay

## 2022-07-26 ENCOUNTER — Other Ambulatory Visit: Payer: Self-pay | Admitting: Obstetrics

## 2022-07-26 ENCOUNTER — Encounter: Payer: Self-pay | Admitting: Obstetrics

## 2022-07-26 DIAGNOSIS — R102 Pelvic and perineal pain: Secondary | ICD-10-CM

## 2022-07-26 LAB — CYTOLOGY - PAP
Comment: NEGATIVE
Diagnosis: UNDETERMINED — AB
High risk HPV: NEGATIVE

## 2022-07-27 ENCOUNTER — Telehealth: Payer: Self-pay

## 2022-07-27 NOTE — Telephone Encounter (Signed)
S/w pt and advised of pap results and recommendations.

## 2022-08-01 ENCOUNTER — Other Ambulatory Visit: Payer: Self-pay

## 2022-08-01 ENCOUNTER — Encounter: Payer: Self-pay | Admitting: Obstetrics

## 2022-08-01 DIAGNOSIS — B379 Candidiasis, unspecified: Secondary | ICD-10-CM

## 2022-08-01 MED ORDER — FLUCONAZOLE 150 MG PO TABS: 150.0000 mg | ORAL_TABLET | Freq: Once | ORAL | 0 refills | Status: AC

## 2022-08-12 ENCOUNTER — Ambulatory Visit (HOSPITAL_COMMUNITY): Payer: Medicaid Other

## 2022-08-12 ENCOUNTER — Encounter (HOSPITAL_COMMUNITY): Payer: Self-pay

## 2022-09-01 ENCOUNTER — Telehealth: Payer: Medicaid Other | Admitting: Emergency Medicine

## 2022-09-01 DIAGNOSIS — K121 Other forms of stomatitis: Secondary | ICD-10-CM | POA: Diagnosis not present

## 2022-09-01 NOTE — Progress Notes (Signed)
E-Visit for Mouth Ulcers  We are sorry that you are not feeling well.  Here is how we plan to help!  Based on what you have shared with me, it appears that you do have mouth ulcer(s).     The following medications should decrease the discomfort and help with healing. Biotene mouthwash 3 times daily (Available over the counter) and Orajel (Available over the counter)   Mouth ulcers are painful areas in the mouth and gums. These are also known as "canker sores".  They can occur anywhere inside the mouth. While mostly harmless, mouth ulcers can be extremely uncomfortable and may make it difficult to eat, drink, and brush your teeth.  You may have more than 1 ulcer and they can vary and change in size. Mouth ulcers are not contagious and should not be confused with cold sores.  Cold sores appear on the lip or around the outside of the mouth and often begin with a tingling, burning or itching sensation.   While the exact causes are unknown, some common causes and factors that may aggravate mouth ulcers include: Genetics - Sometimes mouth ulcers run in families High alcohol intake Acidic foods such as citrus fruits like pineapple, grapefruit, orange fruits/juices, may aggravate mouth ulcers Other foods high in acidity or spice such as coffee, chocolate, chips, pretzels, eggs, nuts, cheese Quitting smoking Injury caused by biting the tongue or inside of the cheek Diet lacking in 0000000, zinc, folic acid or iron Female hormone shifts with menstruation Excessive fatigue, emotional stress or anxiety Prevention: Talk to your doctor if you are taking meds that are known to cause mouth ulcers such as:   Anti-inflammatory drugs (for example Ibuprofen, Naproxen sodium), pain killers, Beta blockers, Oral nicotine replacement drugs, Some street drugs (heroin).   Avoid allowing any tablets to dissolve in your mouth that are meant to swallowed whole Avoid foods/drinks that trigger or worsen symptoms Keep your  mouth clean with daily brushing and flossing  Home Care: The goal with treatment is to ease the pain where ulcers occur and help them heal as quickly as possible.  There is no medical treatment to prevent mouth ulcers from coming back or recurring.  Avoid spicy and acidic foods Eat soft foods and avoid rough, crunchy foods Avoid chewing gum Do not use toothpaste that contains sodium lauryl sulphite Use a straw to drink which helps avoid liquids toughing the ulcers near the front of your mouth Use a very soft toothbrush If you have dentures or dental hardware that you feel is not fitting well or contributing to his, please see your dentist. Use saltwater mouthwash which helps healing. Dissolve a  teaspoon of salt in a glass of warm water. Swish around your mouth and spit it out. This can be used as needed if it is soothing.   GET HELP RIGHT AWAY IF: Persistent ulcers require checking IN PERSON (face to face). Any mouth lesion lasting longer than a month should be seen by your DENTIST as soon as possible for evaluation for possible oral cancer. If you have a non-painful ulcer in 1 or more areas of your mouth Ulcers that are spreading, are very large or particularly painful Ulcers last longer than one week without improving on treatment If you develop a fever, swollen glands and begin to feel unwell Ulcers that developed after starting a new medication MAKE SURE YOU: Understand these instructions. Will watch your condition. Will get help right away if you are not doing well or get  worse.  Thank you for choosing an e-visit.  Your e-visit answers were reviewed by a board certified advanced clinical practitioner to complete your personal care plan. Depending upon the condition, your plan could have included both over the counter or prescription medications.  Please review your pharmacy choice. Make sure the pharmacy is open so you can pick up prescription now. If there is a problem, you may  contact your provider through [employer] and have the prescription routed to another pharmacy.  Your safety is important to [place]. If you have drug allergies check your prescription carefully.   For the next 24 hours you can use MyChart to ask questions about today's visit, request a non-urgent call back, or ask for a work or school excuse. You will get an email in the next two days asking about your experience. I hope that your e-visit has been valuable and will speed your recovery.  Approximately 5 minutes was used in reviewing the patient's chart, questionnaire, prescribing medications, and documentation.

## 2022-09-20 ENCOUNTER — Other Ambulatory Visit: Payer: Self-pay

## 2022-09-20 ENCOUNTER — Emergency Department (HOSPITAL_BASED_OUTPATIENT_CLINIC_OR_DEPARTMENT_OTHER)
Admission: EM | Admit: 2022-09-20 | Discharge: 2022-09-20 | Disposition: A | Discharge status: Home / Self Care | Payer: Medicaid Other | Attending: Emergency Medicine | Admitting: Emergency Medicine

## 2022-09-20 ENCOUNTER — Encounter (HOSPITAL_BASED_OUTPATIENT_CLINIC_OR_DEPARTMENT_OTHER): Payer: Self-pay

## 2022-09-20 DIAGNOSIS — L509 Urticaria, unspecified: Secondary | ICD-10-CM | POA: Diagnosis not present

## 2022-09-20 DIAGNOSIS — W5582XA Struck by other mammals, initial encounter: Secondary | ICD-10-CM | POA: Diagnosis not present

## 2022-09-20 DIAGNOSIS — W64XXXA Exposure to other animate mechanical forces, initial encounter: Secondary | ICD-10-CM

## 2022-09-20 MED ORDER — AMOXICILLIN-POT CLAVULANATE 875-125 MG PO TABS: 1.0000 | ORAL_TABLET | Freq: Two times a day (BID) | ORAL | 0 refills | Status: AC

## 2022-09-20 NOTE — Discharge Instructions (Addendum)
You were given a prescription for antibiotics.  Please take 1 tablet twice a day for the next 7 days.  Follow-up with your primary care physician as needed.

## 2022-09-20 NOTE — ED Provider Notes (Signed)
Christina Fox Provider Note   CSN: [id number] Arrival date & time: 09/20/22  2048     History  Chief Complaint  Patient presents with   Animal Encounter    Christina Fox is a 42 y.o. female.  42 year old female presents to the ED with a chief complaint of animal encounter which occurred approximately 1 hour ago.  Patient reports he was throwing out the trash when an animal jumped on her left shoulder.  She reports she was wearing a jacket, there is no breakage in the skin.  However, she is concerned for any infectious process.  She does suffer from allergies, reports she takes daily Claritin.  She does have some hives on her left and right forearm.  No signs of breakage in the skin.  Patient reports the animal was very big, she is allergic to cats, therefore she had some concern.  No other complaints reported.  The history is provided by the patient.       Home Medications Prior to Admission medications   Medication Sig Start Date End Date Taking? Authorizing Provider  amoxicillin-clavulanate (AUGMENTIN) 875-125 MG tablet Take 1 tablet by mouth every 12 (twelve) hours for 7 days. 09/20/22 09/27/22 Yes Saphyre Cillo, Beverley Fiedler, PA-C  famotidine (PEPCID) 40 MG tablet Take 1 tablet (40 mg total) by mouth at bedtime. 03/02/22   Brunetta Jeans, PA-C  fluconazole (DIFLUCAN) 150 MG tablet Take 1 tablet PO once. Repeat in 3 days if needed. 06/15/22   Brunetta Jeans, PA-C  fluconazole (DIFLUCAN) 150 MG tablet Take 1 tablet (150 mg total) by mouth daily. 07/18/22   Dorie Rank, MD  levocetirizine Harlow Ohms ALLERGY 24HR) 5 MG tablet  10/04/20   [provider]  losartan (COZAAR) 50 MG tablet Take 50 mg by mouth daily. 11/10/21   [provider]  metroNIDAZOLE (METROGEL) 0.75 % vaginal gel Place 1 Applicatorful vaginally at bedtime. Apply one applicatorful to vagina at bedtime for 5 days 07/21/22   Shelly Bombard, MD  montelukast (SINGULAIR) 10 MG  tablet Take 10 mg by mouth daily. 12/28/21   [provider]  norethindrone (CAMILA) 0.35 MG tablet Take 1 tablet (0.35 mg total) by mouth daily. 04/11/22   Aletha Halim, MD  ondansetron (ZOFRAN-ODT) 4 MG disintegrating tablet Take 1 tablet (4 mg total) by mouth every 8 (eight) hours as needed for nausea or vomiting. 06/27/22   Mar Daring, PA-C  oseltamivir (TAMIFLU) 75 MG capsule Take 1 capsule (75 mg total) by mouth 2 (two) times daily. 06/27/22   Mar Daring, PA-C  potassium chloride SA (KLOR-CON M) 20 MEQ tablet Take 1 tablet (20 mEq total) by mouth 2 (two) times daily for 5 days. 12/14/21 12/19/21  Darlina Rumpf, CNM  predniSONE (DELTASONE) 20 MG tablet Take 3 tabs PO daily x 5 days. 06/15/22   Eliezer Lofts, FNP  Prenat-Fe Poly-Methfol-FA-DHA (VITAFOL ULTRA) 29-0.6-0.4-200 MG CAPS Take 1 capsule by mouth daily before breakfast. 05/12/22   Shelly Bombard, MD  escitalopram (LEXAPRO) 10 MG tablet Take 10 mg by mouth daily.    09/02/11  [provider]      Allergies    Clarithromycin, Doxycycline, Bentyl [dicyclomine hcl], Haldol [haloperidol], Meclizine, Morphine and related, Toradol [ketorolac tromethamine], Nitrofurantoin, and Sulfa antibiotics    Review of Systems   Review of Systems  Respiratory:  Negative for shortness of breath.   Gastrointestinal:  Negative for abdominal pain.  Skin:  Negative for wound.  Physical Exam Updated Vital Signs BP (!) 156/87   Pulse 86   Temp 98.8 F (37.1 C)   Resp 18   Ht 5\' 3"  (1.6 m)   Wt 81.6 kg   SpO2 100%   BMI 31.89 kg/m  Physical Exam Vitals and nursing note reviewed.  Constitutional:      Appearance: Normal appearance.  HENT:     Head: Normocephalic and atraumatic.     Mouth/Throat:     Mouth: Mucous membranes are moist.  Cardiovascular:     Rate and Rhythm: Normal rate.  Pulmonary:     Effort: Pulmonary effort is normal.  Abdominal:     General: Abdomen is flat.   Musculoskeletal:     Cervical back: Normal range of motion and neck supple.  Skin:    General: Skin is warm and dry.     Comments: No signs of breakage in the skin around her upper extremities, back, chest.  Neurological:     Mental Status: She is alert and oriented to person, place, and time.     ED Results / Procedures / Treatments   Labs (all labs ordered are listed, but only abnormal results are displayed) Labs Reviewed - No data to display  EKG None  Radiology No results found.  Procedures Procedures    Medications Ordered in ED Medications - No data to display  ED Course/ Medical Decision Making/ A&P                             Medical Decision Making Risk Prescription drug management.    Patient presents to the ED status post animal encounter with throwing away her trash approximately 1 hour ago.  Patient reports that she had a large animal hop onto her left shoulder, reports this feels sore.  She is concerned that she is currently allergic to cats.  She is unsure whether this was a cat, or another animal.  There is no breakage in the skin throughout her entire upper extremities, back, chest.  She does have records of her prior tetanus immunization approximately in 2013, but without any breakage in the skin, I do not feel that she needs 1 of these at this time.  She is requesting antibiotics on today's visit, she is concerned for any infection.  She also reports feeling somewhat itchy, does take Claritin daily, we discussed Benadryl however patient is currently driving therefore not given while in the ED.  She is otherwise hemodynamically stable, vitals are within normal limits.  Patient is hemodynamically stable for discharge.  Portions of this note were generated with [employer]. Dictation errors may occur despite best attempts at proofreading.   Final Clinical Impression(s) / ED Diagnoses Final diagnoses:  Injury caused by animal, initial  encounter    Rx / DC Orders ED Discharge Orders          Ordered    amoxicillin-clavulanate (AUGMENTIN) 875-125 MG tablet  Every 12 hours        09/20/22 2122              Janeece Fitting, PA-C 09/20/22 2134    Sherwood Gambler, MD 09/24/22 [phone]

## 2022-09-20 NOTE — ED Triage Notes (Signed)
Was taking trash out tonight at apartment complex at 8 PM. After throwing bags of trash in a non-visualized animal jumped on her. No cuts/ bites/ or punctures noted.   Hives on left forearm after encounter. Says she is allergic to many things outside and taken Claritin for it.

## 2022-10-05 ENCOUNTER — Encounter: Payer: Self-pay | Admitting: Obstetrics

## 2022-10-07 ENCOUNTER — Other Ambulatory Visit: Payer: Self-pay | Admitting: Obstetrics

## 2022-10-13 ENCOUNTER — Telehealth: Payer: Medicaid Other | Admitting: Physician Assistant

## 2022-10-13 DIAGNOSIS — T3695XA Adverse effect of unspecified systemic antibiotic, initial encounter: Secondary | ICD-10-CM | POA: Diagnosis not present

## 2022-10-13 DIAGNOSIS — B379 Candidiasis, unspecified: Secondary | ICD-10-CM

## 2022-10-13 MED ORDER — FLUCONAZOLE 150 MG PO TABS: 150.0000 mg | ORAL_TABLET | ORAL | 0 refills | Status: DC | PRN

## 2022-10-13 NOTE — Progress Notes (Signed)
E-Visit for Vaginal Symptoms  We are sorry that you are not feeling well. Here is how we plan to help! Based on what you shared with me it looks like you: May have a yeast vaginosis  Vaginosis is an inflammation of the vagina that can result in discharge, itching and pain. The cause is usually a change in the normal balance of vaginal bacteria or an infection. Vaginosis can also result from reduced estrogen levels after menopause.  The most common causes of vaginosis are:   Bacterial vaginosis which results from an overgrowth of one on several organisms that are normally present in your vagina.   Yeast infections which are caused by a naturally occurring fungus called candida.   Vaginal atrophy (atrophic vaginosis) which results from the thinning of the vagina from reduced estrogen levels after menopause.   Trichomoniasis which is caused by a parasite and is commonly transmitted by sexual intercourse.  Factors that increase your risk of developing vaginosis include: Medications, such as antibiotics and steroids Uncontrolled diabetes Use of hygiene products such as bubble bath, vaginal spray or vaginal deodorant Douching Wearing damp or tight-fitting clothing Using an intrauterine device (IUD) for birth control Hormonal changes, such as those associated with pregnancy, birth control pills or menopause Sexual activity Having a sexually transmitted infection  Your treatment plan is Diflucan (fluconazole) 150mg tablet once, may repeat in 72 hours if needed.  I have electronically sent this prescription into the pharmacy that you have chosen.  Be sure to take all of the medication as directed. Stop taking any medication if you develop a rash, tongue swelling or shortness of breath. Mothers who are breast feeding should consider pumping and discarding their breast milk while on these antibiotics. However, there is no consensus that infant exposure at these doses would be harmful.  Remember  that medication creams can weaken latex condoms. .   HOME CARE:  Good hygiene may prevent some types of vaginosis from recurring and may relieve some symptoms:  Avoid baths, hot tubs and whirlpool spas. Rinse soap from your outer genital area after a shower, and dry the area well to prevent irritation. Don't use scented or harsh soaps, such as those with deodorant or antibacterial action. Avoid irritants. These include scented tampons and pads. Wipe from front to back after using the toilet. Doing so avoids spreading fecal bacteria to your vagina.  Other things that may help prevent vaginosis include:  Don't douche. Your vagina doesn't require cleansing other than normal bathing. Repetitive douching disrupts the normal organisms that reside in the vagina and can actually increase your risk of vaginal infection. Douching won't clear up a vaginal infection. Use a latex condom. Both female and female latex condoms may help you avoid infections spread by sexual contact. Wear cotton underwear. Also wear pantyhose with a cotton crotch. If you feel comfortable without it, skip wearing underwear to bed. Yeast thrives in moist environments Your symptoms should improve in the next day or two.  GET HELP RIGHT AWAY IF:  You have pain in your lower abdomen ( pelvic area or over your ovaries) You develop nausea or vomiting You develop a fever Your discharge changes or worsens You have persistent pain with intercourse You develop shortness of breath, a rapid pulse, or you faint.  These symptoms could be signs of problems or infections that need to be evaluated by a medical provider now.  MAKE SURE YOU   Understand these instructions. Will watch your condition. Will get help right   away if you are not doing well or get worse.  Thank you for choosing an e-visit.  Your e-visit answers were reviewed by a board certified advanced clinical practitioner to complete your personal care plan. Depending  upon the condition, your plan could have included both over the counter or prescription medications.  Please review your pharmacy choice. Make sure the pharmacy is open so you can pick up prescription now. If there is a problem, you may contact your provider through MyChart messaging and have the prescription routed to another pharmacy.  Your safety is important to us. If you have drug allergies check your prescription carefully.   For the next 24 hours you can use MyChart to ask questions about today's visit, request a non-urgent call back, or ask for a work or school excuse. You will get an email in the next two days asking about your experience. I hope that your e-visit has been valuable and will speed your recovery.  I have spent 5 minutes in review of e-visit questionnaire, review and updating patient chart, medical decision making and response to patient.   Sasan Wilkie M Tyja Gortney, PA-C  

## 2022-10-31 ENCOUNTER — Telehealth: Payer: Medicaid Other | Admitting: Physician Assistant

## 2022-10-31 DIAGNOSIS — H00011 Hordeolum externum right upper eyelid: Secondary | ICD-10-CM

## 2022-11-01 MED ORDER — POLYMYXIN B-TRIMETHOPRIM 10000-0.1 UNIT/ML-% OP SOLN: OPHTHALMIC | 0 refills | Status: DC

## 2022-11-01 MED ORDER — NEOMYCIN-POLYMYXIN-HC 3.5-10000-1 OP SUSP: 3.0000 [drp] | Freq: Four times a day (QID) | OPHTHALMIC | 0 refills | Status: DC

## 2022-11-01 NOTE — Addendum Note (Signed)
Addended by: Waldon Merl on: 11/01/2022 10:19 AM   Modules accepted: Orders

## 2022-11-01 NOTE — Progress Notes (Signed)
  E-Visit for Stye   We are sorry that you are not feeling well. Here is how we plan to help!  Based on what you have shared with me it looks like you have a stye.  A stye is an inflammation of the eyelid.  It is often a red, painful lump near the edge of the eyelid that may look like a boil or a pimple.  A stye develops when an infection occurs at the base of an eyelash.   We have made appropriate suggestions for you based upon your presentation: Simple styes can be treated without medical intervention.  Most styes either resolve spontaneously or resolve with simple home treatment by applying warm compresses or heated washcloth to the stye for about 10-15 minutes three to four times a day. This causes the stye to drain and resolve. Giving tenderness at the site, I am adding on a prescription antibiotic drop to use as directed.   HOME CARE:  Wash your hands often! Let the stye open on its own. Don't squeeze or open it. Don't rub your eyes. This can irritate your eyes and let in bacteria.  If you need to touch your eyes, wash your hands first. Don't wear eye makeup or contact lenses until the area has healed.  GET HELP RIGHT AWAY IF:  Your symptoms do not improve. You develop blurred or loss of vision. Your symptoms worsen (increased discharge, pain or redness).   Thank you for choosing an e-visit.  Your e-visit answers were reviewed by a board certified advanced clinical practitioner to complete your personal care plan. Depending upon the condition, your plan could have included both over the counter or prescription medications.  Please review your pharmacy choice. Make sure the pharmacy is open so you can pick up prescription now. If there is a problem, you may contact your provider through [employer] and have the prescription routed to another pharmacy.  Your safety is important to [place]. If you have drug allergies check your prescription carefully.   For the next 24 hours you can  use MyChart to ask questions about today's visit, request a non-urgent call back, or ask for a work or school excuse. You will get an email in the next two days asking about your experience. I hope that your e-visit has been valuable and will speed your recovery.

## 2022-11-01 NOTE — Progress Notes (Signed)
I have spent 5 minutes in review of e-visit questionnaire, review and updating patient chart, medical decision making and response to patient.   Zhane Donlan Cody Ahmadou Bolz, PA-C    

## 2022-11-21 ENCOUNTER — Other Ambulatory Visit: Payer: Self-pay | Admitting: Obstetrics and Gynecology

## 2022-11-24 ENCOUNTER — Encounter: Payer: Self-pay | Admitting: Obstetrics

## 2022-11-24 ENCOUNTER — Other Ambulatory Visit: Payer: Self-pay

## 2022-11-24 MED ORDER — NORETHINDRONE 0.35 MG PO TABS: 1.0000 | ORAL_TABLET | Freq: Every day | ORAL | 2 refills | Status: DC

## 2022-12-21 ENCOUNTER — Ambulatory Visit
Admission: EM | Admit: 2022-12-21 | Discharge: 2022-12-21 | Disposition: A | Discharge status: Home / Self Care | Payer: Medicaid Other

## 2022-12-21 ENCOUNTER — Ambulatory Visit (INDEPENDENT_AMBULATORY_CARE_PROVIDER_SITE_OTHER): Payer: Medicaid Other

## 2022-12-21 DIAGNOSIS — J209 Acute bronchitis, unspecified: Secondary | ICD-10-CM | POA: Diagnosis not present

## 2022-12-21 DIAGNOSIS — R0989 Other specified symptoms and signs involving the circulatory and respiratory systems: Secondary | ICD-10-CM

## 2022-12-21 MED ORDER — PROMETHAZINE-DM 6.25-15 MG/5ML PO SYRP: 2.5000 mL | ORAL_SOLUTION | Freq: Three times a day (TID) | ORAL | 0 refills | Status: DC | PRN

## 2022-12-21 MED ORDER — ALBUTEROL SULFATE HFA 108 (90 BASE) MCG/ACT IN AERS: 1.0000 | INHALATION_SPRAY | Freq: Four times a day (QID) | RESPIRATORY_TRACT | 0 refills | Status: DC | PRN

## 2022-12-21 MED ORDER — METHYLPREDNISOLONE ACETATE 40 MG/ML IJ SUSP
40.0000 mg | Freq: Once | INTRAMUSCULAR | Status: AC
  Administered 2022-12-21: 40 mg via INTRAMUSCULAR

## 2022-12-21 NOTE — ED Triage Notes (Signed)
Pt reports cough, sore throat, ear pain, fatigue,  x 1 day.Cough gives chest discomfort;  Reports she can not take oral steroids, she tolerates IM steroids. Pt reports she was seen at another UC yesterday and had a negative Strep test and negative COVID at home. Reports she always has the same symptoms round this time when is hot and her PCP gives her a shots for this.

## 2022-12-21 NOTE — ED Provider Notes (Signed)
[facility]  Note:  This document was prepared using Conservation officer, historic buildings and may include unintentional dictation errors.  MRN: [id number] DOB: 1981-04-10  Subjective:   Christina Fox is a 42 y.o. female presenting for 1 day history of acute onset persistent coughing that is productive with eliciting chest pain.  She is also had ear pain, throat pain, malaise and fatigue.  Patient states that usually her regular doctor gives her IM dexamethasone and IM Rocephin.  She normally has a difficult time with this time of year and has a history of sinus infections.  Medical chart shows upper airway cough syndrome, nasal turbinate hypertrophy, deviated septum.  The latter has been corrected procedurally with the nasal septoplasty with turbinoplasty from 2013.  No smoking of any kind including cigarettes, cigars, vaping, marijuana use.  She did go to a different urgent care yesterday and was tested for strep which was negative.  She was prescribed oral steroids which she cannot tolerate due to GI side effects.  She did a COVID test at home and was negative.  No current facility-administered medications for this encounter.  Current Outpatient Medications:    fluticasone (FLONASE) 50 MCG/ACT nasal spray, Place into both nostrils daily., Disp: , Rfl:    montelukast (SINGULAIR) 10 MG tablet, Take 10 mg by mouth at bedtime., Disp: , Rfl:    famotidine (PEPCID) 40 MG tablet, Take 1 tablet (40 mg total) by mouth at bedtime., Disp: 15 tablet, Rfl: 0   fluconazole (DIFLUCAN) 150 MG tablet, Take 1 tablet (150 mg total) by mouth every 3 (three) days as needed., Disp: 2 tablet, Rfl: 0   levocetirizine (XYZAL ALLERGY 24HR) 5 MG tablet, , Disp: , Rfl:    losartan (COZAAR) 50 MG tablet, Take 50 mg by mouth daily., Disp: , Rfl:    metroNIDAZOLE (METROGEL) 0.75 % vaginal gel, Place 1 Applicatorful vaginally at bedtime. Apply one applicatorful to vagina at bedtime for 5 days, Disp:  70 g, Rfl: 1   montelukast (SINGULAIR) 10 MG tablet, Take 10 mg by mouth daily., Disp: , Rfl:    norethindrone (CAMILA) 0.35 MG tablet, Take 1 tablet (0.35 mg total) by mouth daily., Disp: 60 tablet, Rfl: 2   potassium chloride SA (KLOR-CON M) 20 MEQ tablet, Take 1 tablet (20 mEq total) by mouth 2 (two) times daily for 5 days., Disp: 10 tablet, Rfl: 0   Prenat-Fe Poly-Methfol-FA-DHA (VITAFOL ULTRA) 29-0.6-0.4-200 MG CAPS, Take 1 capsule by mouth daily before breakfast., Disp: 90 capsule, Rfl: 4   trimethoprim-polymyxin b (POLYTRIM) ophthalmic solution, Apply 1-2 drops into affected eye QID x 5 days., Disp: 10 mL, Rfl: 0   Allergies  Allergen Reactions   Clarithromycin Hives and Itching   Doxycycline Shortness Of Breath    Chest pain   Bentyl [Dicyclomine Hcl] Other (See Comments)    DIZZINESS   Haldol [Haloperidol] Other (See Comments)    Made her feel very jittery and agitated   Meclizine     Chest pain   Morphine And Codeine Other (See Comments)    Made her feel like she was losing her mind   Toradol [Ketorolac Tromethamine] Other (See Comments)    Made her feel very jittery and agitated   Amlodipine Diarrhea   Nitrofurantoin Rash   Sulfa Antibiotics Hives and Rash    Past Medical History:  Diagnosis Date   Abdominal pain    "right lower quadrant pain and right lower back pain   Anxiety  Deviated septum 09/2011   GERD (gastroesophageal reflux disease)    Headache(784.0)    sinus   History of echocardiogram    Echo 11/17: EF 55-60, no RWMA, normal diastolic function   Hypertension    no meds   Kidney stones    no current problem   Migraine    Nasal turbinate hypertrophy 09/2011   bilat.   Pelvic inflammatory disease    Urticaria      Past Surgical History:  Procedure Laterality Date   CHOLECYSTECTOMY N/A 01/15/2016   Procedure: LAPAROSCOPIC CHOLECYSTECTOMY WITH INTRAOPERATIVE CHOLANGIOGRAM;  Surgeon: Kieth Brightly, MD;  Location: [facility];  Service:  General;  Laterality: N/A;   COLONOSCOPY  2007   [facility]: pedunculated 15mm polyp in distal sigmoid, sessile 2mm polyp at splenic flexure, larger polyp tubulovillous adenoma and smaller polyp tubular adenoma    COLONOSCOPY  09-09-15   COLONOSCOPY WITH PROPOFOL N/A 03/01/2016   Procedure: COLONOSCOPY WITH PROPOFOL;  Surgeon: Charolett Bumpers, MD;  Location: [facility];  Service: Endoscopy;  Laterality: N/A;   NASAL SEPTOPLASTY W/ TURBINOPLASTY  10/04/2011   Procedure: NASAL SEPTOPLASTY WITH TURBINATE REDUCTION;  Surgeon: Darletta Moll, MD;  Location: [facility];  Service: ENT;  Laterality: Bilateral;   WISDOM TOOTH EXTRACTION  2009    Family History  Problem Relation Age of Onset   Kidney cancer Mother    Hypertension Mother    Migraines Mother    Multiple sclerosis Mother    Fibromyalgia Mother    Liver disease Mother        NASH    Hypertension Father    Liver disease Father        liver transplant; had fatty liver disease   Spina bifida Brother    Migraines Maternal Grandmother    Hypertension Other    Colon cancer Paternal Grandfather    Allergic rhinitis Daughter    Asthma Daughter    Allergic rhinitis Son    Asthma Son    Angioedema Neg Hx    Eczema Neg Hx    Immunodeficiency Neg Hx    Urticaria Neg Hx     Social History   Tobacco Use   Smoking status: Former    Packs/day: 0.25    Years: 10.00    Additional pack years: 0.00    Total pack years: 2.50    Types: Cigarettes   Smokeless tobacco: Never   Tobacco comments:    2-3 cigarettes a day  Vaping Use   Vaping Use: Never used  Substance Use Topics   Alcohol use: Yes    Alcohol/week: 0.0 standard drinks of alcohol    Comment: ocassionally   Drug use: No    ROS   Objective:   Vitals: BP (!) 163/95 (BP Location: Right Arm)   Pulse 74   Temp 99.1 F (37.3 C) (Oral)   Resp 18   SpO2 94%   Physical Exam Constitutional:      General: She is not in acute distress.    Appearance: Normal  appearance. She is well-developed and normal weight. She is not ill-appearing, toxic-appearing or diaphoretic.  HENT:     Head: Normocephalic and atraumatic.     Right Ear: Tympanic membrane, ear canal and external ear normal. No drainage or tenderness. No middle ear effusion. There is no impacted cerumen. Tympanic membrane is not erythematous or bulging.     Left Ear: Tympanic membrane, ear canal and external ear normal. No drainage or tenderness.  No middle ear effusion. There is no impacted cerumen. Tympanic membrane is not erythematous or bulging.     Nose: Nose normal. No congestion or rhinorrhea.     Mouth/Throat:     Mouth: Mucous membranes are moist. No oral lesions.     Pharynx: No pharyngeal swelling, oropharyngeal exudate, posterior oropharyngeal erythema or uvula swelling.     Tonsils: No tonsillar exudate or tonsillar abscesses.  Eyes:     General: No scleral icterus.       Right eye: No discharge.        Left eye: No discharge.     Extraocular Movements: Extraocular movements intact.     Right eye: Normal extraocular motion.     Left eye: Normal extraocular motion.     Conjunctiva/sclera: Conjunctivae normal.  Cardiovascular:     Rate and Rhythm: Normal rate and regular rhythm.     Heart sounds: Normal heart sounds. No murmur heard.    No friction rub. No gallop.  Pulmonary:     Effort: Pulmonary effort is normal. No respiratory distress.     Breath sounds: No stridor. No wheezing, rhonchi or rales.  Chest:     Chest wall: No tenderness.  Musculoskeletal:     Cervical back: Normal range of motion and neck supple.  Lymphadenopathy:     Cervical: No cervical adenopathy.  Skin:    General: Skin is warm and dry.  Neurological:     General: No focal deficit present.     Mental Status: She is alert and oriented to person, place, and time.  Psychiatric:        Mood and Affect: Mood normal.        Behavior: Behavior normal.    DG Chest 2 View  Result Date:  12/21/2022 CLINICAL DATA:  Cough, chest pain EXAM: CHEST - 2 VIEW COMPARISON:  05/25/2022 FINDINGS: Transverse diameter of heart is increased. There are no signs of pulmonary edema or focal pulmonary consolidation. There is no pleural effusion or pneumothorax. IMPRESSION: No active cardiopulmonary disease. Electronically Signed   By: Ernie Avena M.D.   On: 12/21/2022 19:17    IM Depo-Medrol 40 mg administered in clinic.  Assessment and Plan :   PDMP not reviewed this encounter.  1. Acute bronchitis, unspecified organism   2. Chest congestion    Will manage for bronchitis, asthmatic bronchitis with IM steroids as above, supportive care. Discussed antibiotic stewardship. Counseled patient on potential for adverse effects with medications prescribed/recommended today, ER and return-to-clinic precautions discussed, patient verbalized understanding.    Wallis Bamberg, [place] 12/22/22 [phone]

## 2022-12-31 ENCOUNTER — Emergency Department (HOSPITAL_BASED_OUTPATIENT_CLINIC_OR_DEPARTMENT_OTHER): Payer: Medicaid Other | Admitting: Radiology

## 2022-12-31 ENCOUNTER — Encounter (HOSPITAL_BASED_OUTPATIENT_CLINIC_OR_DEPARTMENT_OTHER): Payer: Self-pay

## 2022-12-31 ENCOUNTER — Emergency Department (HOSPITAL_BASED_OUTPATIENT_CLINIC_OR_DEPARTMENT_OTHER)
Admission: EM | Admit: 2022-12-31 | Discharge: 2022-12-31 | Discharge status: Against Medical Advice | Payer: Medicaid Other | Attending: Emergency Medicine | Admitting: Emergency Medicine

## 2022-12-31 DIAGNOSIS — R0789 Other chest pain: Secondary | ICD-10-CM | POA: Insufficient documentation

## 2022-12-31 DIAGNOSIS — Z5321 Procedure and treatment not carried out due to patient leaving prior to being seen by health care provider: Secondary | ICD-10-CM | POA: Insufficient documentation

## 2022-12-31 LAB — CBC
HCT: 40.9 % (ref 36.0–46.0)
Hemoglobin: 13.5 g/dL (ref 12.0–15.0)
MCH: 29.8 pg (ref 26.0–34.0)
MCHC: 33 g/dL (ref 30.0–36.0)
MCV: 90.3 fL (ref 80.0–100.0)
Platelets: 341 10*3/uL (ref 150–400)
RBC: 4.53 MIL/uL (ref 3.87–5.11)
RDW: 13.6 % (ref 11.5–15.5)
WBC: 11.3 10*3/uL — ABNORMAL HIGH (ref 4.0–10.5)
nRBC: 0 % (ref 0.0–0.2)

## 2022-12-31 LAB — BASIC METABOLIC PANEL
Anion gap: 10 (ref 5–15)
BUN: 12 mg/dL (ref 6–20)
CO2: 26 mmol/L (ref 22–32)
Calcium: 9 mg/dL (ref 8.9–10.3)
Chloride: 101 mmol/L (ref 98–111)
Creatinine, Ser: 0.75 mg/dL (ref 0.44–1.00)
GFR, Estimated: >60 mL/min (ref >60)
Glucose, Bld: 91 mg/dL (ref 70–99)
Potassium: 3.6 mmol/L (ref 3.5–5.1)
Sodium: 137 mmol/L (ref 135–145)

## 2022-12-31 LAB — TROPONIN I (HIGH SENSITIVITY): Troponin I (High Sensitivity): 3 ng/L (ref <18)

## 2022-12-31 NOTE — ED Triage Notes (Signed)
Pt c/o fatigue and sternal chest pain. Pt was dx with bronchitis last week. Pt states that this feels worse than her past presentations of bronchitis. Negative COVID test.

## 2022-12-31 NOTE — ED Notes (Signed)
Pt left ED lobby per registration staff. Informed to stay to see provider, pt told registration she was leaving.

## 2023-01-05 ENCOUNTER — Telehealth: Payer: Medicaid Other | Admitting: Physician Assistant

## 2023-01-05 DIAGNOSIS — B379 Candidiasis, unspecified: Secondary | ICD-10-CM | POA: Diagnosis not present

## 2023-01-05 DIAGNOSIS — T3695XA Adverse effect of unspecified systemic antibiotic, initial encounter: Secondary | ICD-10-CM

## 2023-01-05 MED ORDER — FLUCONAZOLE 150 MG PO TABS
150.0000 mg | ORAL_TABLET | ORAL | 0 refills | Status: DC | PRN
Start: 2023-01-05 — End: 2023-06-06

## 2023-01-05 NOTE — Progress Notes (Signed)

## 2023-01-17 ENCOUNTER — Telehealth: Payer: Medicaid Other | Admitting: Family Medicine

## 2023-01-17 DIAGNOSIS — N898 Other specified noninflammatory disorders of vagina: Secondary | ICD-10-CM

## 2023-01-18 ENCOUNTER — Ambulatory Visit: Payer: Medicaid Other

## 2023-01-18 NOTE — Progress Notes (Signed)
Because you had first line treatment on 7/11 and symptoms are still present your condition warrants further evaluation and I recommend that you be seen in a face to face visit.   NOTE: There will be NO CHARGE for this eVisit   If you are having a true medical emergency please call 911.

## 2023-02-06 ENCOUNTER — Other Ambulatory Visit: Payer: Self-pay | Admitting: Obstetrics and Gynecology

## 2023-02-28 ENCOUNTER — Encounter: Payer: Self-pay | Admitting: Obstetrics

## 2023-02-28 ENCOUNTER — Other Ambulatory Visit: Payer: Self-pay

## 2023-02-28 DIAGNOSIS — B379 Candidiasis, unspecified: Secondary | ICD-10-CM

## 2023-02-28 MED ORDER — FLUCONAZOLE 150 MG PO TABS
150.0000 mg | ORAL_TABLET | Freq: Once | ORAL | 0 refills | Status: AC
Start: 2023-02-28 — End: 2023-02-28

## 2023-03-13 ENCOUNTER — Telehealth: Payer: Medicaid Other | Admitting: Nurse Practitioner

## 2023-03-13 DIAGNOSIS — H60509 Unspecified acute noninfective otitis externa, unspecified ear: Secondary | ICD-10-CM | POA: Diagnosis not present

## 2023-03-13 MED ORDER — CIPROFLOXACIN-DEXAMETHASONE 0.3-0.1 % OT SUSP
4.0000 [drp] | Freq: Two times a day (BID) | OTIC | 0 refills | Status: AC
Start: 2023-03-13 — End: 2023-03-20

## 2023-03-13 NOTE — Progress Notes (Signed)
E Visit for Ear Pain - Swimmer's Ear/ external ear infection   We are sorry that you are not feeling well. Here is how we plan to help!  Based on what you have shared with me it looks like you have Swimmer's Ear (external ear infection).  This is a redness or swelling, irritation, or infection of your outer ear canal. These symptoms usually occur within a few days of swimming. Your ear canal is a tube that goes from the opening of the ear to the eardrum.  When water stays in your ear canal, germs can grow.  This is a painful condition that often happens to children and swimmers of all ages.  It is not contagious and oral antibiotics are not required to treat uncomplicated swimmer's ear.  The usual symptoms include:    Itchiness inside the ear  Redness or a sense of swelling in the ear  Pain when the ear is tugged on when pressure is placed on the ear  Pus draining from the infected ear    I have prescribed: Ciprofloxin and dexamethasone otic suspension 3 drops in affected ears twice daily for 7 days  In certain cases, swimmer's ear may progress to a more serious bacterial infection of the middle or inner ear.  If you have a fever 102 and up and significantly worsening symptoms, this could indicate a more serious infection moving to the middle/inner and needs face to face evaluation in an office by a provider.  Your symptoms should improve over the next 3 days and should resolve in about 7 days.  Be sure to complete ALL of your prescription.  HOME CARE: Wash your hands frequently. If you are prescribed an ear drop, do not place the tip of the bottle on your ear or touch it with your fingers. You can take Acetaminophen 650 mg every 4-6 hours as needed for pain.  If pain is severe or moderate, you can apply a heating pad (set on low) or hot water bottle (wrapped in a towel) to outer ear for 20 minutes.  This will also increase drainage. Avoid ear plugs Do not go swimming until the symptoms are  gone Do not use Q-tips After showers, help the water run out by tilting your head to one side.   GET HELP RIGHT AWAY IF: Fever is over 102.2 degrees. You develop progressive ear pain or hearing loss. Ear symptoms persist longer than 3 days after treatment.  MAKE SURE YOU: Understand these instructions. Will watch your condition. Will get help right away if you are not doing well or get worse.  TO PREVENT SWIMMER'S EAR: Use a bathing cap or custom fitted swim molds to keep your ears dry. Towel off after swimming to dry your ears. Tilt your head or pull your earlobes to allow the water to escape your ear canal. If there is still water in your ears, consider using a hairdryer on the lowest setting.  Thank you for choosing an e-visit.  Your e-visit answers were reviewed by a board certified advanced clinical practitioner to complete your personal care plan. Depending upon the condition, your plan could have included both over the counter or prescription medications.  Please review your pharmacy choice. Make sure the pharmacy is open so you can pick up the prescription now. If there is a problem, you may contact your provider through [employer] and have the prescription routed to another pharmacy.  Your safety is important to [place]. If you have drug allergies check  your prescription carefully.   For the next 24 hours you can use MyChart to ask questions about today's visit, request a non-urgent call back, or ask for a work or school excuse. You will get an email with a survey after your eVisit asking about your experience. We would appreciate your feedback. I hope that your e-visit has been valuable and will aid in your recovery.  Meds ordered this encounter  Medications   ciprofloxacin-dexamethasone (CIPRODEX) OTIC suspension    Sig: Place 4 drops into the right ear 2 (two) times daily for 7 days.    Dispense:  7.5 mL    Refill:  0    I spent approximately 5 minutes reviewing the  patient's history, current symptoms and coordinating their care today.

## 2023-03-17 ENCOUNTER — Encounter: Payer: Self-pay | Admitting: Obstetrics

## 2023-05-21 ENCOUNTER — Other Ambulatory Visit: Payer: Self-pay | Admitting: Obstetrics

## 2023-05-21 DIAGNOSIS — D508 Other iron deficiency anemias: Secondary | ICD-10-CM

## 2023-05-23 ENCOUNTER — Telehealth: Payer: Medicaid Other | Admitting: Physician Assistant

## 2023-05-23 DIAGNOSIS — J019 Acute sinusitis, unspecified: Secondary | ICD-10-CM

## 2023-05-23 DIAGNOSIS — B9689 Other specified bacterial agents as the cause of diseases classified elsewhere: Secondary | ICD-10-CM | POA: Diagnosis not present

## 2023-05-23 MED ORDER — AMOXICILLIN-POT CLAVULANATE 875-125 MG PO TABS
1.0000 | ORAL_TABLET | Freq: Two times a day (BID) | ORAL | 0 refills | Status: DC
Start: 2023-05-23 — End: 2023-06-06

## 2023-05-23 NOTE — Progress Notes (Signed)
I have spent 5 minutes in review of e-visit questionnaire, review and updating patient chart, medical decision making and response to patient.   Mia Milan Cody Jacklynn Dehaas, PA-C    

## 2023-05-23 NOTE — Progress Notes (Signed)

## 2023-05-28 ENCOUNTER — Telehealth: Payer: Medicaid Other | Admitting: Family

## 2023-05-28 DIAGNOSIS — H00015 Hordeolum externum left lower eyelid: Secondary | ICD-10-CM

## 2023-05-28 MED ORDER — BACITRACIN-POLYMYXIN B 500-10000 UNIT/GM OP OINT
1.0000 | TOPICAL_OINTMENT | Freq: Three times a day (TID) | OPHTHALMIC | 0 refills | Status: DC
Start: 2023-05-28 — End: 2023-06-06

## 2023-05-28 NOTE — Progress Notes (Signed)
  E-Visit for Stye   We are sorry that you are not feeling well. Here is how we plan to help!  Based on what you have shared with me it looks like you have a stye.  A stye is an inflammation of the eyelid.  It is often a red, painful lump near the edge of the eyelid that may look like a boil or a pimple.  A stye develops when an infection occurs at the base of an eyelash.   We have made appropriate suggestions for you based upon your presentation: Simple styes can be treated without medical intervention.  Most styes either resolve spontaneously or resolve with simple home treatment by applying warm compresses or heated washcloth to the stye for about 10-15 minutes three to four times a day. This causes the stye to drain and resolve.  I have sent in polysporin ophthalmic ointment you will apply three times a day.   HOME CARE:  Wash your hands often! Let the stye open on its own. Don't squeeze or open it. Don't rub your eyes. This can irritate your eyes and let in bacteria.  If you need to touch your eyes, wash your hands first. Don't wear eye makeup or contact lenses until the area has healed.  GET HELP RIGHT AWAY IF:  Your symptoms do not improve. You develop blurred or loss of vision. Your symptoms worsen (increased discharge, pain or redness).   Thank you for choosing an e-visit.  Your e-visit answers were reviewed by a board certified advanced clinical practitioner to complete your personal care plan. Depending upon the condition, your plan could have included both over the counter or prescription medications.  Please review your pharmacy choice. Make sure the pharmacy is open so you can pick up prescription now. If there is a problem, you may contact your provider through [employer] and have the prescription routed to another pharmacy.  Your safety is important to [place]. If you have drug allergies check your prescription carefully.   For the next 24 hours you can use MyChart  to ask questions about today's visit, request a non-urgent call back, or ask for a work or school excuse. You will get an email in the next two days asking about your experience. I hope that your e-visit has been valuable and will speed your recovery.  Approximately 5 minutes was spent documenting and reviewing patient's chart.

## 2023-06-06 ENCOUNTER — Other Ambulatory Visit: Payer: Self-pay | Admitting: Obstetrics

## 2023-06-06 ENCOUNTER — Telehealth: Payer: Medicaid Other | Admitting: Physician Assistant

## 2023-06-06 DIAGNOSIS — S39012A Strain of muscle, fascia and tendon of lower back, initial encounter: Secondary | ICD-10-CM

## 2023-06-06 MED ORDER — CYCLOBENZAPRINE HCL 10 MG PO TABS
5.0000 mg | ORAL_TABLET | Freq: Three times a day (TID) | ORAL | 0 refills | Status: DC | PRN
Start: 2023-06-06 — End: 2023-08-22

## 2023-06-06 NOTE — Progress Notes (Signed)
E-Visit for Back Pain   We are sorry that you are not feeling well.  Here is how we plan to help!  Based on what you have shared with me it looks like you mostly have acute back pain.  Acute back pain is defined as musculoskeletal pain that can resolve in 1-3 weeks with conservative treatment.  I have prescribed Flexeril 10 mg every eight hours as needed which is a muscle relaxer . Please keep in mind that muscle relaxer's can cause fatigue and should not be taken while at work or driving.  Back pain is very common.  The pain often gets better over time.  The cause of back pain is usually not dangerous.  Most people can learn to manage their back pain on their own.  Continue alternating tylenol and Ibuprofen. Your OB/GYN has already refilled your Ibuprofen 800mg .   Home Care Stay active.  Start with short walks on flat ground if you can.  Try to walk farther each day. Do not sit, drive or stand in one place for more than 30 minutes.  Do not stay in bed. Do not avoid exercise or work.  Activity can help your back heal faster. Be careful when you bend or lift an object.  Bend at your knees, keep the object close to you, and do not twist. Sleep on a firm mattress.  Lie on your side, and bend your knees.  If you lie on your back, put a pillow under your knees. Only take medicines as told by your doctor. Put ice on the injured area. Put ice in a plastic bag Place a towel between your skin and the bag Leave the ice on for 15-20 minutes, 3-4 times a day for the first 2-3 days. 210 After that, you can switch between ice and heat packs. Ask your doctor about back exercises or massage. Avoid feeling anxious or stressed.  Find good ways to deal with stress, such as exercise.  Get Help Right Way If: Your pain does not go away with rest or medicine. Your pain does not go away in 1 week. You have new problems. You do not feel well. The pain spreads into your legs. You cannot control when you poop  (bowel movement) or pee (urinate) You feel sick to your stomach (nauseous) or throw up (vomit) You have belly (abdominal) pain. You feel like you may pass out (faint). If you develop a fever.  Make Sure you: Understand these instructions. Will watch your condition Will get help right away if you are not doing well or get worse.  Your e-visit answers were reviewed by a board certified advanced clinical practitioner to complete your personal care plan.  Depending on the condition, your plan could have included both over the counter or prescription medications.  If there is a problem please reply  once you have received a response from your provider.  Your safety is important to [place].  If you have drug allergies check your prescription carefully.    You can use MyChart to ask questions about today's visit, request a non-urgent call back, or ask for a work or school excuse for 24 hours related to this e-Visit. If it has been greater than 24 hours you will need to follow up with your provider, or enter a new e-Visit to address those concerns.  You will get an e-mail in the next two days asking about your experience.  I hope that your e-visit has been valuable and will speed  your recovery. Thank you for using e-visits.   I have spent 5 minutes in review of e-visit questionnaire, review and updating patient chart, medical decision making and response to patient.   Margaretann Loveless, PA-C

## 2023-06-11 ENCOUNTER — Other Ambulatory Visit: Payer: Self-pay

## 2023-06-11 DIAGNOSIS — I10 Essential (primary) hypertension: Secondary | ICD-10-CM | POA: Insufficient documentation

## 2023-06-11 DIAGNOSIS — M545 Low back pain, unspecified: Secondary | ICD-10-CM | POA: Insufficient documentation

## 2023-06-11 LAB — URINALYSIS, ROUTINE W REFLEX MICROSCOPIC
Bilirubin Urine: NEGATIVE
Glucose, UA: NEGATIVE mg/dL
Hgb urine dipstick: NEGATIVE
Ketones, ur: NEGATIVE mg/dL
Leukocytes,Ua: NEGATIVE
Nitrite: NEGATIVE
Protein, ur: NEGATIVE mg/dL
Specific Gravity, Urine: 1.009 (ref 1.005–1.030)
pH: 6 (ref 5.0–8.0)

## 2023-06-11 LAB — COMPREHENSIVE METABOLIC PANEL
ALT: 25 U/L (ref 0–44)
AST: 19 U/L (ref 15–41)
Albumin: 4.4 g/dL (ref 3.5–5.0)
Alkaline Phosphatase: 57 U/L (ref 38–126)
Anion gap: 7 (ref 5–15)
BUN: 14 mg/dL (ref 6–20)
CO2: 28 mmol/L (ref 22–32)
Calcium: 9.1 mg/dL (ref 8.9–10.3)
Chloride: 102 mmol/L (ref 98–111)
Creatinine, Ser: 0.73 mg/dL (ref 0.44–1.00)
GFR, Estimated: >60 mL/min (ref >60)
Glucose, Bld: 107 mg/dL — ABNORMAL HIGH (ref 70–99)
Potassium: 3 mmol/L — ABNORMAL LOW (ref 3.5–5.1)
Sodium: 137 mmol/L (ref 135–145)
Total Bilirubin: 0.6 mg/dL (ref <1.2)
Total Protein: 7.1 g/dL (ref 6.5–8.1)

## 2023-06-11 LAB — CBC
HCT: 38.2 % (ref 36.0–46.0)
Hemoglobin: 13 g/dL (ref 12.0–15.0)
MCH: 31 pg (ref 26.0–34.0)
MCHC: 34 g/dL (ref 30.0–36.0)
MCV: 91 fL (ref 80.0–100.0)
Platelets: 328 10*3/uL (ref 150–400)
RBC: 4.2 MIL/uL (ref 3.87–5.11)
RDW: 12.9 % (ref 11.5–15.5)
WBC: 10.2 10*3/uL (ref 4.0–10.5)
nRBC: 0 % (ref 0.0–0.2)

## 2023-06-11 LAB — PREGNANCY, URINE: Preg Test, Ur: NEGATIVE

## 2023-06-11 LAB — LIPASE, BLOOD: Lipase: 27 U/L (ref 11–51)

## 2023-06-11 NOTE — ED Triage Notes (Signed)
Pt POV from home reporting lower back/ bilateral pelvic pain that began 12/6, now experiencing clear vaginal discharge, denies odor or burning. Also reports last period was very light, "only spotting".

## 2023-06-12 ENCOUNTER — Emergency Department (HOSPITAL_BASED_OUTPATIENT_CLINIC_OR_DEPARTMENT_OTHER): Payer: Medicaid Other

## 2023-06-12 ENCOUNTER — Emergency Department (HOSPITAL_BASED_OUTPATIENT_CLINIC_OR_DEPARTMENT_OTHER)
Admission: EM | Admit: 2023-06-12 | Discharge: 2023-06-12 | Disposition: A | Discharge status: Home / Self Care | Payer: Medicaid Other | Attending: Emergency Medicine | Admitting: Emergency Medicine

## 2023-06-12 DIAGNOSIS — M545 Low back pain, unspecified: Secondary | ICD-10-CM

## 2023-06-12 LAB — WET PREP, GENITAL
Clue Cells Wet Prep HPF POC: NONE SEEN
Sperm: NONE SEEN
Trich, Wet Prep: NONE SEEN
WBC, Wet Prep HPF POC: <10 (ref <10)
Yeast Wet Prep HPF POC: NONE SEEN

## 2023-06-12 MED ORDER — HYDROCODONE-ACETAMINOPHEN 5-325 MG PO TABS: 1.0000 | ORAL_TABLET | Freq: Four times a day (QID) | ORAL | 0 refills | Status: DC | PRN

## 2023-06-12 MED ORDER — PREDNISONE 10 MG PO TABS: 20.0000 mg | ORAL_TABLET | Freq: Two times a day (BID) | ORAL | 0 refills | Status: DC

## 2023-06-12 NOTE — ED Notes (Signed)
Pt transported to CT via wheelchair.

## 2023-06-12 NOTE — Discharge Instructions (Signed)
Begin taking prednisone as prescribed.  Begin taking hydrocodone as prescribed as needed for pain.  Follow-up with primary doctor if not improving in the next week.

## 2023-06-12 NOTE — ED Provider Notes (Signed)
[place] EMERGENCY DEPARTMENT AT [facility] Provider Note   CSN: [id number] Arrival date & time: 06/11/23  2232     History  No chief complaint on file.   Christina Fox is a 42 y.o. female.  Patient is a 42 year old female with past medical history of hypertension, anxiety.  Patient presenting today with complaints of back pain.  She reports a 10-day history of pain in her low back that radiates into her left lower abdomen.  The pain has been constant and worse when she attempts to change position, bend over, or removed.  She reports the pain radiates into both of her legs, but denies any bowel or bladder issues.  She denies any weakness or numbness.  She had an virtual visit 2 days ago and was prescribed Flexeril, however this is not helping.  The history is provided by the patient.       Home Medications Prior to Admission medications   Medication Sig Start Date End Date Taking? Authorizing Provider  albuterol (VENTOLIN HFA) 108 (90 Base) MCG/ACT inhaler Inhale 1-2 puffs into the lungs every 6 (six) hours as needed for wheezing or shortness of breath. 12/21/22   Wallis Bamberg, PA-C  cyclobenzaprine (FLEXERIL) 10 MG tablet Take 0.5-1 tablets (5-10 mg total) by mouth 3 (three) times daily as needed. 06/06/23   Margaretann Loveless, PA-C  famotidine (PEPCID) 40 MG tablet Take 1 tablet (40 mg total) by mouth at bedtime. 03/02/22   Waldon Merl, PA-C  fluticasone (FLONASE) 50 MCG/ACT nasal spray Place into both nostrils daily.    [provider]  ibuprofen (ADVIL) 800 MG tablet TAKE 1 TABLET BY MOUTH EVERY 8 HOURS AS NEEDED 06/06/23   Brock Bad, MD  levocetirizine Elita Boone ALLERGY 24HR) 5 MG tablet  10/04/20   [provider]  losartan (COZAAR) 50 MG tablet Take 50 mg by mouth daily. 11/10/21   [provider]  montelukast (SINGULAIR) 10 MG tablet Take 10 mg by mouth daily. 12/28/21   [provider]  montelukast (SINGULAIR) 10 MG  tablet Take 10 mg by mouth at bedtime.    [provider]  norethindrone (MICRONOR) 0.35 MG tablet TAKE 1 TABLET BY MOUTH EVERY DAY 02/07/23   Reva Bores, MD  potassium chloride SA (KLOR-CON M) 20 MEQ tablet Take 1 tablet (20 mEq total) by mouth 2 (two) times daily for 5 days. 12/14/21 12/19/21  Val Bibles C, CNM  Prenat-Fe Poly-Methfol-FA-DHA (VITAFOL ULTRA) 29-0.6-0.4-200 MG CAPS TAKE 1 CAPSULE BY MOUTH ONCE DAILY BEFORE BREAKFAST 05/22/23   Reva Bores, MD  escitalopram (LEXAPRO) 10 MG tablet Take 10 mg by mouth daily.    09/02/11  [provider]      Allergies    Clarithromycin, Doxycycline, Bentyl [dicyclomine hcl], Haldol [haloperidol], Meclizine, Morphine and codeine, Toradol [ketorolac tromethamine], Amlodipine, Nitrofurantoin, and Sulfa antibiotics    Review of Systems   Review of Systems  All other systems reviewed and are negative.   Physical Exam Updated Vital Signs BP (!) 157/88   Pulse 87   Temp 97.9 F (36.6 C) (Oral)   Resp 17   Ht 5\' 3"  (1.6 m)   SpO2 100%   BMI 31.89 kg/m  Physical Exam Vitals and nursing note reviewed.  Constitutional:      General: She is not in acute distress.    Appearance: She is well-developed. She is not diaphoretic.  HENT:     Head: Normocephalic and atraumatic.  Cardiovascular:  Rate and Rhythm: Normal rate and regular rhythm.     Heart sounds: No murmur heard.    No friction rub. No gallop.  Pulmonary:     Effort: Pulmonary effort is normal. No respiratory distress.     Breath sounds: Normal breath sounds. No wheezing.  Abdominal:     General: Bowel sounds are normal. There is no distension.     Palpations: Abdomen is soft.     Tenderness: There is no abdominal tenderness.  Musculoskeletal:        General: Normal range of motion.     Cervical back: Normal range of motion and neck supple.     Comments: There is tenderness to palpation in the soft tissues of the lumbar region.  Skin:     General: Skin is warm and dry.  Neurological:     General: No focal deficit present.     Mental Status: She is alert and oriented to person, place, and time.     Deep Tendon Reflexes: Reflexes normal.     Comments: DTRs are 1+ and symmetrical in the patellar and Achilles tendons bilaterally.  Strength is 5 out of 5 in both lower extremities.  She is able to ambulate on heels and toes without difficulty.     ED Results / Procedures / Treatments   Labs (all labs ordered are listed, but only abnormal results are displayed) Labs Reviewed  COMPREHENSIVE METABOLIC PANEL - Abnormal; Notable for the following components:      Result Value   Potassium 3.0 (*)    Glucose, Bld 107 (*)    All other components within normal limits  URINALYSIS, ROUTINE W REFLEX MICROSCOPIC - Abnormal; Notable for the following components:   Color, Urine COLORLESS (*)    All other components within normal limits  WET PREP, GENITAL  LIPASE, BLOOD  CBC  PREGNANCY, URINE  GC/CHLAMYDIA PROBE AMP ([place]) NOT AT [facility]    EKG None  Radiology No results found.  Procedures Procedures    Medications Ordered in ED Medications - No data to display  ED Course/ Medical Decision Making/ A&P  Patient is a 42 year old female presenting with complaints of back pain as described in the HPI.  Patient arrives here with stable vital signs and is afebrile.  She is neurologically intact on her exam.  Laboratory studies obtained including CBC, CMP, and lipase, all of which are unremarkable.  Urinalysis inconsistent with UTI and pregnancy test negative.  Wet prep also obtained showing no acute abnormality.  GC test pending.  CT with renal protocol obtained due to the reported degree of the patient's discomfort, however shows no acute abnormality.  Patient presenting with back pain, the etiology of which I suspect is musculoskeletal.  There are no red flags that would suggest an emergent situation and I feel as though a  course of prednisone and pain medication is appropriate.  Patient to follow-up with primary doctor if not improving.  Final Clinical Impression(s) / ED Diagnoses Final diagnoses:  None    Rx / DC Orders ED Discharge Orders     None         Geoffery Lyons, MD 06/12/23 [phone]

## 2023-06-13 LAB — GC/CHLAMYDIA PROBE AMP (~~LOC~~) NOT AT ARMC
Chlamydia: NEGATIVE
Comment: NEGATIVE
Comment: NORMAL
Neisseria Gonorrhea: NEGATIVE

## 2023-06-15 ENCOUNTER — Other Ambulatory Visit: Payer: Self-pay | Admitting: Family Medicine

## 2023-06-15 DIAGNOSIS — D508 Other iron deficiency anemias: Secondary | ICD-10-CM

## 2023-06-19 ENCOUNTER — Other Ambulatory Visit: Payer: Self-pay | Admitting: Obstetrics

## 2023-06-19 DIAGNOSIS — D508 Other iron deficiency anemias: Secondary | ICD-10-CM

## 2023-06-23 MED ORDER — VITAFOL ULTRA 29-0.6-0.4-200 MG PO CAPS: 1.0000 | ORAL_CAPSULE | Freq: Every day | ORAL | 11 refills | Status: AC

## 2023-08-21 ENCOUNTER — Telehealth: Payer: Medicaid Other | Admitting: Family Medicine

## 2023-08-21 ENCOUNTER — Telehealth: Payer: Medicaid Other

## 2023-08-21 DIAGNOSIS — L731 Pseudofolliculitis barbae: Secondary | ICD-10-CM

## 2023-08-21 NOTE — Progress Notes (Signed)
 [place]  Education and follow up if needed.  Patient acknowledged agreement and understanding of the plan.

## 2023-08-22 ENCOUNTER — Telehealth: Payer: Medicaid Other | Admitting: Physician Assistant

## 2023-08-22 DIAGNOSIS — L739 Follicular disorder, unspecified: Secondary | ICD-10-CM | POA: Diagnosis not present

## 2023-08-22 MED ORDER — CEPHALEXIN 500 MG PO CAPS: 500.0000 mg | ORAL_CAPSULE | Freq: Four times a day (QID) | ORAL | 0 refills | Status: AC

## 2023-08-22 NOTE — Progress Notes (Signed)
 E-Visit for Cellulitis  We are sorry that you are not feeling well. Here is how we plan to help!  Based on what you shared with me it looks like you have folliculitis.  Folliculitis looks like areas of skin redness, swelling, and warmth at the base of hair follicles; it develops as a result of bacteria entering under the skin. Little red spots and/or bleeding can be seen in skin, and tiny surface sacs containing fluid can occur. Fever can be present.   I have prescribed:  Keflex 500mg  take one by mouth four times a day for 5 days  HOME CARE:  Take your medications as ordered and take all of them, even if the skin irritation appears to be healing.   GET HELP RIGHT AWAY IF:  Symptoms that don't begin to go away within 48 hours. Severe redness persists or worsens If the area turns color, spreads or swells. If it blisters and opens, develops yellow-brown crust or bleeds. You develop a fever or chills. If the pain increases or becomes unbearable.  Are unable to keep fluids and food down.  MAKE SURE YOU   Understand these instructions. Will watch your condition. Will get help right away if you are not doing well or get worse.  Thank you for choosing an e-visit.  Your e-visit answers were reviewed by a board certified advanced clinical practitioner to complete your personal care plan. Depending upon the condition, your plan could have included both over the counter or prescription medications.  Please review your pharmacy choice. Make sure the pharmacy is open so you can pick up prescription now. If there is a problem, you may contact your provider through [employer] and have the prescription routed to another pharmacy.  Your safety is important to [place]. If you have drug allergies check your prescription carefully.   For the next 24 hours you can use MyChart to ask questions about today's visit, request a non-urgent call back, or ask for a work or school excuse. You will get an email  in the next two days asking about your experience. I hope that your e-visit has been valuable and will speed your recovery.

## 2023-08-22 NOTE — Progress Notes (Signed)
 I have spent 5 minutes in review of e-visit questionnaire, review and updating patient chart, medical decision making and response to patient.   Piedad Climes, PA-C

## 2023-08-26 ENCOUNTER — Telehealth: Admitting: Family Medicine

## 2023-08-26 DIAGNOSIS — L739 Follicular disorder, unspecified: Secondary | ICD-10-CM

## 2023-08-26 NOTE — Progress Notes (Signed)
  Because it seems you are allergic to the majority of the antibiotics we would use to treat this infection and cannot tolerate the antibiotics,  I feel your condition warrants further evaluation and I recommend that you be seen in an in person, face-to-face visit.   NOTE: There will be NO CHARGE for this E-Visit   If you are having a true medical emergency, please call 911.     For an urgent face to face visit, [place] has multiple urgent care centers for your convenience.  Click the link below for the full list of locations and hours, walk-in wait times, appointment scheduling options and driving directions:  Urgent Care - [place], [place], [place], [place], [place], [place]  [place]     Your MyChart E-visit questionnaire answers were reviewed by a board certified advanced clinical practitioner to complete your personal care plan based on your specific symptoms.    Thank you for using e-Visits.   I have spent 5 minutes in review of e-visit questionnaire, review and updating patient chart, medical decision making and response to patient.   Reed Pandy, PA-C

## 2023-09-20 ENCOUNTER — Telehealth: Admitting: Physician Assistant

## 2023-09-20 DIAGNOSIS — R3989 Other symptoms and signs involving the genitourinary system: Secondary | ICD-10-CM

## 2023-09-20 MED ORDER — FLUCONAZOLE 150 MG PO TABS: 150.0000 mg | ORAL_TABLET | ORAL | 0 refills | Status: DC | PRN

## 2023-09-20 MED ORDER — AMOXICILLIN-POT CLAVULANATE 875-125 MG PO TABS: 1.0000 | ORAL_TABLET | Freq: Two times a day (BID) | ORAL | 0 refills | Status: DC

## 2023-09-20 MED ORDER — CEPHALEXIN 500 MG PO CAPS: 500.0000 mg | ORAL_CAPSULE | Freq: Two times a day (BID) | ORAL | 0 refills | Status: DC

## 2023-09-20 NOTE — Progress Notes (Signed)

## 2023-09-20 NOTE — Addendum Note (Signed)
 Addended by: Margaretann Loveless on: 09/20/2023 07:59 AM   Modules accepted: Orders

## 2023-09-28 ENCOUNTER — Emergency Department (HOSPITAL_BASED_OUTPATIENT_CLINIC_OR_DEPARTMENT_OTHER)

## 2023-09-28 ENCOUNTER — Encounter (HOSPITAL_BASED_OUTPATIENT_CLINIC_OR_DEPARTMENT_OTHER): Payer: Self-pay | Admitting: Emergency Medicine

## 2023-09-28 ENCOUNTER — Other Ambulatory Visit: Payer: Self-pay

## 2023-09-28 ENCOUNTER — Emergency Department (HOSPITAL_BASED_OUTPATIENT_CLINIC_OR_DEPARTMENT_OTHER)
Admission: EM | Admit: 2023-09-28 | Discharge: 2023-09-28 | Disposition: A | Discharge status: Home / Self Care | Attending: Emergency Medicine | Admitting: Emergency Medicine

## 2023-09-28 DIAGNOSIS — I10 Essential (primary) hypertension: Secondary | ICD-10-CM | POA: Insufficient documentation

## 2023-09-28 DIAGNOSIS — Z79899 Other long term (current) drug therapy: Secondary | ICD-10-CM | POA: Insufficient documentation

## 2023-09-28 DIAGNOSIS — R1031 Right lower quadrant pain: Secondary | ICD-10-CM | POA: Diagnosis present

## 2023-09-28 LAB — LIPASE, BLOOD: Lipase: 36 U/L (ref 11–51)

## 2023-09-28 LAB — CBC
HCT: 39 % (ref 36.0–46.0)
Hemoglobin: 13.1 g/dL (ref 12.0–15.0)
MCH: 30.7 pg (ref 26.0–34.0)
MCHC: 33.6 g/dL (ref 30.0–36.0)
MCV: 91.3 fL (ref 80.0–100.0)
Platelets: 295 10*3/uL (ref 150–400)
RBC: 4.27 MIL/uL (ref 3.87–5.11)
RDW: 12.4 % (ref 11.5–15.5)
WBC: 8.8 10*3/uL (ref 4.0–10.5)
nRBC: 0 % (ref 0.0–0.2)

## 2023-09-28 LAB — COMPREHENSIVE METABOLIC PANEL WITH GFR
ALT: 20 U/L (ref 0–44)
AST: 15 U/L (ref 15–41)
Albumin: 4.2 g/dL (ref 3.5–5.0)
Alkaline Phosphatase: 49 U/L (ref 38–126)
Anion gap: 8 (ref 5–15)
BUN: 9 mg/dL (ref 6–20)
CO2: 27 mmol/L (ref 22–32)
Calcium: 9.1 mg/dL (ref 8.9–10.3)
Chloride: 102 mmol/L (ref 98–111)
Creatinine, Ser: 0.66 mg/dL (ref 0.44–1.00)
GFR, Estimated: >60 mL/min (ref >60)
Glucose, Bld: 104 mg/dL — ABNORMAL HIGH (ref 70–99)
Potassium: 3.5 mmol/L (ref 3.5–5.1)
Sodium: 137 mmol/L (ref 135–145)
Total Bilirubin: 0.4 mg/dL (ref 0.0–1.2)
Total Protein: 6.8 g/dL (ref 6.5–8.1)

## 2023-09-28 LAB — URINALYSIS, ROUTINE W REFLEX MICROSCOPIC
Bilirubin Urine: NEGATIVE
Glucose, UA: NEGATIVE mg/dL
Hgb urine dipstick: NEGATIVE
Ketones, ur: NEGATIVE mg/dL
Leukocytes,Ua: NEGATIVE
Nitrite: NEGATIVE
Protein, ur: NEGATIVE mg/dL
Specific Gravity, Urine: <1.005 — ABNORMAL LOW (ref 1.005–1.030)
pH: 6.5 (ref 5.0–8.0)

## 2023-09-28 LAB — PREGNANCY, URINE: Preg Test, Ur: NEGATIVE

## 2023-09-28 MED ORDER — ACETAMINOPHEN 500 MG PO TABS
1000.0000 mg | ORAL_TABLET | Freq: Once | ORAL | Status: AC
Start: 2023-09-28 — End: 2023-09-28
  Administered 2023-09-28: 1000 mg via ORAL
  Filled 2023-09-28: qty 2

## 2023-09-28 NOTE — ED Provider Notes (Signed)
 [place] EMERGENCY DEPARTMENT AT [facility] Provider Note   CSN: [id number] Arrival date & time: 09/28/23  1418     History  Chief Complaint  Patient presents with   Abdominal Pain    Christina Fox is a 43 y.o. female with history of PID, nephrolithiasis, hypertension presents with complaints of right lower quadrant pain that radiates to her right flank x 2 weeks.  Denies any nausea, vomiting, diarrhea.  There are no urinary symptoms.  No vaginal bleeding or discharge.  States this feels different than prior kidney stones.  Nothing seems to make the pain worse or better.  She does have a history of cholecystectomy.   Abdominal Pain     Past Medical History:  Diagnosis Date   Abdominal pain    "right lower quadrant pain and right lower back pain   Anxiety    Deviated septum 09/2011   GERD (gastroesophageal reflux disease)    Headache(784.0)    sinus   History of echocardiogram    Echo 11/17: EF 55-60, no RWMA, normal diastolic function   Hypertension    no meds   Kidney stones    no current problem   Migraine    Nasal turbinate hypertrophy 09/2011   bilat.   Pelvic inflammatory disease    Urticaria      Home Medications Prior to Admission medications   Medication Sig Start Date End Date Taking? Authorizing Provider  amoxicillin-clavulanate (AUGMENTIN) 875-125 MG tablet Take 1 tablet by mouth 2 (two) times daily. 09/20/23   Margaretann Loveless, PA-C  fluconazole (DIFLUCAN) 150 MG tablet Take 1 tablet (150 mg total) by mouth every 3 (three) days as needed. 09/20/23   Margaretann Loveless, PA-C  fluticasone (FLONASE) 50 MCG/ACT nasal spray Place into both nostrils daily.    [provider]  ibuprofen (ADVIL) 800 MG tablet TAKE 1 TABLET BY MOUTH EVERY 8 HOURS AS NEEDED 06/06/23   Brock Bad, MD  levocetirizine Elita Boone ALLERGY 24HR) 5 MG tablet  10/04/20   [provider]  losartan (COZAAR) 50 MG tablet Take 50 mg by mouth daily.  11/10/21   [provider]  montelukast (SINGULAIR) 10 MG tablet Take 10 mg by mouth daily. 12/28/21   [provider]  norethindrone (MICRONOR) 0.35 MG tablet TAKE 1 TABLET BY MOUTH EVERY DAY 02/07/23   Reva Bores, MD  potassium chloride SA (KLOR-CON M) 20 MEQ tablet Take 1 tablet (20 mEq total) by mouth 2 (two) times daily for 5 days. 12/14/21 12/19/21  Fikes Bibles C, CNM  Prenat-Fe Poly-Methfol-FA-DHA (VITAFOL ULTRA) 29-0.6-0.4-200 MG CAPS Take 1 capsule by mouth daily. 06/23/23   Brock Bad, MD  escitalopram (LEXAPRO) 10 MG tablet Take 10 mg by mouth daily.    09/02/11  [provider]      Allergies    Clarithromycin, Doxycycline, Bentyl [dicyclomine hcl], Cephalexin, Haldol [haloperidol], Meclizine, Morphine and codeine, Toradol [ketorolac tromethamine], Amlodipine, Nitrofurantoin, and Sulfa antibiotics    Review of Systems   Review of Systems  Gastrointestinal:  Positive for abdominal pain.    Physical Exam Updated Vital Signs BP (!) 151/82   Pulse 66   Temp 97.8 F (36.6 C)   Resp 16   Ht 5\' 3"  (1.6 m)   SpO2 100%   BMI 31.89 kg/m  Physical Exam Vitals and nursing note reviewed.  Constitutional:      General: She is not in acute distress.    Appearance: She is well-developed.  HENT:     Head: Normocephalic and atraumatic.  Eyes:     Conjunctiva/sclera: Conjunctivae normal.  Cardiovascular:     Rate and Rhythm: Normal rate and regular rhythm.     Heart sounds: No murmur heard. Pulmonary:     Effort: Pulmonary effort is normal. No respiratory distress.     Breath sounds: Normal breath sounds.  Abdominal:     Palpations: Abdomen is soft.     Tenderness: There is abdominal tenderness.     Comments: Tenderness to right lower quadrant without rebound or guarding.  No CVAT.  Musculoskeletal:        General: No swelling.     Cervical back: Neck supple.  Skin:    General: Skin is warm and dry.     Capillary Refill: Capillary  refill takes less than 2 seconds.  Neurological:     Mental Status: She is alert.  Psychiatric:        Mood and Affect: Mood normal.     ED Results / Procedures / Treatments   Labs (all labs ordered are listed, but only abnormal results are displayed) Labs Reviewed  URINALYSIS, ROUTINE W REFLEX MICROSCOPIC - Abnormal; Notable for the following components:      Result Value   Color, Urine COLORLESS (*)    Specific Gravity, Urine <1.005 (*)    All other components within normal limits  CBC  PREGNANCY, URINE  LIPASE, BLOOD  COMPREHENSIVE METABOLIC PANEL WITH GFR    EKG None  Radiology CT Renal Stone Study Result Date: 09/28/2023 CLINICAL DATA:  Right-sided abdominal pain. Concern for kidney stone. EXAM: CT ABDOMEN AND PELVIS WITHOUT CONTRAST TECHNIQUE: Multidetector CT imaging of the abdomen and pelvis was performed following the standard protocol without IV contrast. RADIATION DOSE REDUCTION: This exam was performed according to the departmental dose-optimization program which includes automated exposure control, adjustment of the mA and/or kV according to patient size and/or use of iterative reconstruction technique. COMPARISON:  CT abdomen pelvis dated 06/12/2023. FINDINGS: Evaluation of this exam is limited in the absence of intravenous contrast. Lower chest: The visualized lung bases are clear. No intra-abdominal free air or free fluid. Hepatobiliary: The liver is unremarkable. No biliary dilatation. Cholecystectomy. Pancreas: Unremarkable. No pancreatic ductal dilatation or surrounding inflammatory changes. Spleen: Normal in size without focal abnormality. Adrenals/Urinary Tract: The adrenal glands are unremarkable. Faint punctate left renal inferior pole calculus. No hydronephrosis or obstructing stone. The right kidney is unremarkable. The visualized ureters and urinary bladder appear unremarkable. Stomach/Bowel: Mild sigmoid diverticulosis. There is no bowel obstruction or active  inflammation. The appendix is normal. Vascular/Lymphatic: The abdominal aorta and IVC are grossly unremarkable on this noncontrast CT. No portal venous gas. There is no adenopathy. Reproductive: The uterus is anteverted and grossly unremarkable. No suspicious adnexal masses. Other: Small fat containing umbilical hernia. Musculoskeletal: No acute or significant osseous findings. IMPRESSION: 1. No acute intra-abdominal or pelvic pathology. 2. Punctate left renal inferior pole calculus. No hydronephrosis or obstructing stone. 3. Mild sigmoid diverticulosis. No bowel obstruction. Normal appendix. Electronically Signed   By: Elgie Collard M.D.   On: 09/28/2023 16:59    Procedures Procedures    Medications Ordered in ED Medications  acetaminophen (TYLENOL) tablet 1,000 mg (1,000 mg Oral Given 09/28/23 1739)    ED Course/ Medical Decision Making/ A&P  Medical Decision Making Amount and/or Complexity of Data Reviewed Labs: ordered. Radiology: ordered.   This patient presents to the ED with chief complaint(s) of abdominal pain.  The complaint involves an extensive differential diagnosis and also carries with it a high risk of complications and morbidity.   pertinent past medical history as listed in HPI  The differential diagnosis includes  Cystitis, pyelonephritis, nephrolithiasis, appendicitis, ovarian cyst/torsion  The initial plan is to  Basic labs and CT renal study ordered in triage Additional history obtained: Records reviewed Care Everywhere/External Records  Initial Assessment:   Patient presents hypertensive 154/93, she is otherwise hemodynamically stable, afebrile and nontoxic-appearing.  On exam patient has right lower quadrant tenderness without rebound or guarding.  No CVAT.  She has no dysuria or increased frequency to suggest cystitis or pyelonephritis.  She does have a history of nephrolithiasis but states this feels distinctly different than  prior kidney stones.  No diarrhea no vomiting to suggest gastroenteritis.  CT renal study is negative.  Considered repeating CT scan with contrast, however patient has no white blood cell count and is afebrile without any vomiting or diarrhea, lower suspicion for appendicitis.  Will obtain pelvic ultrasound.  Although she has no vaginal bleeding or discharge.  No history of ovarian cyst. Independent ECG interpretation:  none  Independent labs interpretation:  The following labs were independently interpreted:  CBC unremarkable, UA with degree specific gravity, negative nitrites, negative leukocytes, CMP without significant abnormality  Independent visualization and interpretation of imaging: I independently visualized the following imaging with scope of interpretation limited to determining acute life threatening conditions related to emergency care:  CT renal study showed no acute abnormality Pelvic ultrasound without acute abnormality Treatment and Reassessment: Offered something for pain, request Tylenol  Reviewed findings with patient, overall unremarkable.  Discussed discharge plan.  Patient is agreeable.  Should symptoms persist or worsen would consider CT scan with contrast should she return. Consultations obtained:   none  Disposition:   Patient will be discharged home. The patient has been appropriately medically screened and/or stabilized in the ED. I have low suspicion for any other emergent medical condition which would require further screening, evaluation or treatment in the ED or require inpatient management. At time of discharge the patient is hemodynamically stable and in no acute distress. I have discussed work-up results and diagnosis with patient and answered all questions. Patient is agreeable with discharge plan. We discussed strict return precautions for returning to the emergency department and they verbalized understanding.     Social Determinants of Health:    none  This note was dictated with voice recognition software.  Despite best efforts at proofreading, errors may have occurred which can change the documentation meaning.          Final Clinical Impression(s) / ED Diagnoses Final diagnoses:  Right lower quadrant abdominal pain    Rx / DC Orders ED Discharge Orders     None         Halford Decamp, PA-C 09/28/23 1936    Virgina Norfolk, DO 09/28/23 2246

## 2023-09-28 NOTE — ED Triage Notes (Signed)
 C/o RLQ and R lower back pain x 2 week. Had virtual visit and started taking antibiotics for possible UTI. Symptoms have not improved.

## 2023-09-28 NOTE — Discharge Instructions (Addendum)
 You were evaluated in the emergency room for abdominal pain.  Your lab work and imaging did not show any significant abnormality.  Please follow-up with your primary care doctor should the symptoms persist.  If you experience any new or worsening symptoms including fevers, chills, persistent vomiting please return to the emergency room.

## 2023-10-10 ENCOUNTER — Other Ambulatory Visit: Payer: Self-pay | Admitting: Obstetrics

## 2023-10-10 ENCOUNTER — Ambulatory Visit: Admitting: Obstetrics

## 2023-10-10 ENCOUNTER — Encounter: Payer: Self-pay | Admitting: Obstetrics

## 2023-10-10 ENCOUNTER — Other Ambulatory Visit (HOSPITAL_COMMUNITY)
Admission: RE | Admit: 2023-10-10 | Discharge: 2023-10-10 | Disposition: A | Discharge status: Home / Self Care | Source: Ambulatory Visit | Attending: Obstetrics | Admitting: Obstetrics

## 2023-10-10 VITALS — BP 141/92 | HR 87 | Wt 189.0 lb

## 2023-10-10 DIAGNOSIS — R102 Pelvic and perineal pain: Secondary | ICD-10-CM | POA: Diagnosis not present

## 2023-10-10 DIAGNOSIS — Z01419 Encounter for gynecological examination (general) (routine) without abnormal findings: Secondary | ICD-10-CM | POA: Diagnosis present

## 2023-10-10 DIAGNOSIS — N644 Mastodynia: Secondary | ICD-10-CM

## 2023-10-10 LAB — POCT URINALYSIS DIPSTICK
Bilirubin, UA: NEGATIVE
Glucose, UA: NEGATIVE
Ketones, UA: NEGATIVE
Leukocytes, UA: NEGATIVE
Nitrite, UA: NEGATIVE
Protein, UA: NEGATIVE
Spec Grav, UA: <=1.005 — AB (ref 1.010–1.025)
Urobilinogen, UA: 0.2 U/dL
pH, UA: 5 (ref 5.0–8.0)

## 2023-10-10 NOTE — Progress Notes (Unsigned)
 Right adnexal pain radiates to back. OTC meds not helpful. Breasts painful x several months.

## 2023-10-10 NOTE — Progress Notes (Unsigned)
 Patient ID: Christina Fox, female   DOB: 26-Jun-1981, 43 y.o.   MRN: [id number]  Chief Complaint  Patient presents with   Pelvic Pain    HPI Christina Fox is a 43 y.o. female.  Right lower abdominal pain.  Breast tenderness, bilaterally. HPI  Past Medical History:  Diagnosis Date   Abdominal pain    "right lower quadrant pain and right lower back pain   Anxiety    Deviated septum 09/2011   GERD (gastroesophageal reflux disease)    Headache(784.0)    sinus   History of echocardiogram    Echo 11/17: EF 55-60, no RWMA, normal diastolic function   Hypertension    no meds   Kidney stones    no current problem   Migraine    Nasal turbinate hypertrophy 09/2011   bilat.   Pelvic inflammatory disease    Urticaria     Past Surgical History:  Procedure Laterality Date   CHOLECYSTECTOMY N/A 01/15/2016   Procedure: LAPAROSCOPIC CHOLECYSTECTOMY WITH INTRAOPERATIVE CHOLANGIOGRAM;  Surgeon: Kieth Brightly, MD;  Location: [facility];  Service: General;  Laterality: N/A;   COLONOSCOPY  2007   [facility]: pedunculated 15mm polyp in distal sigmoid, sessile 2mm polyp at splenic flexure, larger polyp tubulovillous adenoma and smaller polyp tubular adenoma    COLONOSCOPY  09-09-15   COLONOSCOPY WITH PROPOFOL N/A 03/01/2016   Procedure: COLONOSCOPY WITH PROPOFOL;  Surgeon: Charolett Bumpers, MD;  Location: [facility];  Service: Endoscopy;  Laterality: N/A;   NASAL SEPTOPLASTY W/ TURBINOPLASTY  10/04/2011   Procedure: NASAL SEPTOPLASTY WITH TURBINATE REDUCTION;  Surgeon: Darletta Moll, MD;  Location: [facility];  Service: ENT;  Laterality: Bilateral;   WISDOM TOOTH EXTRACTION  2009    Family History  Problem Relation Age of Onset   Kidney cancer Mother    Hypertension Mother    Migraines Mother    Multiple sclerosis Mother    Fibromyalgia Mother    Liver disease Mother        NASH    Hypertension Father    Liver disease Father        liver transplant; had fatty liver  disease   Spina bifida Brother    Migraines Maternal Grandmother    Hypertension Other    Colon cancer Paternal Grandfather    Allergic rhinitis Daughter    Asthma Daughter    Allergic rhinitis Son    Asthma Son    Angioedema Neg Hx    Eczema Neg Hx    Immunodeficiency Neg Hx    Urticaria Neg Hx     Social History Social History   Tobacco Use   Smoking status: Former    Current packs/day: 0.25    Average packs/day: 0.3 packs/day for 10.0 years (2.5 ttl pk-yrs)    Types: Cigarettes   Smokeless tobacco: Never   Tobacco comments:    2-3 cigarettes a day  Vaping Use   Vaping status: Never Used  Substance Use Topics   Alcohol use: Yes    Alcohol/week: 0.0 standard drinks of alcohol    Comment: ocassionally   Drug use: No    Allergies  Allergen Reactions   Clarithromycin Hives and Itching   Doxycycline Shortness Of Breath    Chest pain   Bentyl [Dicyclomine Hcl] Other (See Comments)    DIZZINESS   Cephalexin Diarrhea and Other (See Comments)    Severe cramping and diarrhea    Haldol [Haloperidol] Other (See Comments)    Made  her feel very jittery and agitated   Meclizine     Chest pain   Morphine And Codeine Other (See Comments)    Made her feel like she was losing her mind   Toradol [Ketorolac Tromethamine] Other (See Comments)    Made her feel very jittery and agitated   Amlodipine Diarrhea   Nitrofurantoin Rash   Sulfa Antibiotics Hives and Rash    Current Outpatient Medications  Medication Sig Dispense Refill   fluticasone (FLONASE) 50 MCG/ACT nasal spray Place into both nostrils daily.     ibuprofen (ADVIL) 800 MG tablet TAKE 1 TABLET BY MOUTH EVERY 8 HOURS AS NEEDED 30 tablet 5   levocetirizine (XYZAL ALLERGY 24HR) 5 MG tablet      losartan (COZAAR) 50 MG tablet Take 50 mg by mouth daily.     montelukast (SINGULAIR) 10 MG tablet Take 10 mg by mouth daily.     norethindrone (MICRONOR) 0.35 MG tablet TAKE 1 TABLET BY MOUTH EVERY DAY 56 tablet 1    Prenat-Fe Poly-Methfol-FA-DHA (VITAFOL ULTRA) 29-0.6-0.4-200 MG CAPS Take 1 capsule by mouth daily. 30 capsule 11   amoxicillin-clavulanate (AUGMENTIN) 875-125 MG tablet Take 1 tablet by mouth 2 (two) times daily. 14 tablet 0   fluconazole (DIFLUCAN) 150 MG tablet Take 1 tablet (150 mg total) by mouth every 3 (three) days as needed. 2 tablet 0   potassium chloride SA (KLOR-CON M) 20 MEQ tablet Take 1 tablet (20 mEq total) by mouth 2 (two) times daily for 5 days. 10 tablet 0   No current facility-administered medications for this visit.    Review of Systems Review of Systems Constitutional: negative for fatigue and weight loss Respiratory: negative for cough and wheezing Cardiovascular: negative for chest pain, fatigue and palpitations Gastrointestinal: negative for abdominal pain and change in bowel habits Genitourinary: positive for pelvic pain Integument/breast: positive for bilateral tenderness, no masses.  negative for nipple discharge Musculoskeletal:negative for myalgias Neurological: negative for gait problems and tremors Behavioral/Psych: negative for abusive relationship, depression Endocrine: negative for temperature intolerance      Blood pressure (!) 141/92, pulse 87, weight 189 lb (85.7 kg), last menstrual period 10/07/2023.  Physical Exam Physical Exam General:   Alert and no distress  Skin:   no rash or abnormalities  Lungs:   clear to auscultation bilaterally  Heart:   regular rate and rhythm, S1, S2 normal, no murmur, click, rub or gallop  Breasts:   normal without suspicious masses, skin or nipple changes or axillary nodes  Abdomen:  normal findings: no organomegaly, soft, non-tender and no hernia  Pelvis:  External genitalia: normal general appearance Urinary system: urethral meatus normal and bladder without fullness, nontender Vaginal: normal without tenderness, induration or masses Cervix: normal appearance Adnexa: normal bimanual exam Uterus: anteverted and  non-tender, normal size    I have spent a total of 20 minutes of face-to-face time, excluding clinical staff time, reviewing notes and preparing to see patient, ordering tests and/or medications, and counseling the patient.   Data Reviewed Wet Prep Ultrasound   US PELVIC COMPLETE W TRANSVAGINAL AND TORSION R/O (Accession [id number]) (Order [id number]) Imaging Date: 09/28/2023 Department: [facility] at [facility] Released By: Lennon Alstrom, RT Authorizing: Halford Decamp, PA-C   Exam Status  Status  Final [99]   PACS Intelerad Image Link   Show images for US PELVIC COMPLETE W TRANSVAGINAL AND TORSION R/O Study Result  Narrative & Impression  CLINICAL DATA:  Right lower quadrant pain.  EXAM: TRANSABDOMINAL AND TRANSVAGINAL ULTRASOUND OF PELVIS   DOPPLER ULTRASOUND OF OVARIES   TECHNIQUE: Both transabdominal and transvaginal ultrasound examinations of the pelvis were performed. Transabdominal technique was performed for global imaging of the pelvis including uterus, ovaries, adnexal regions, and pelvic cul-de-sac.   It was necessary to proceed with endovaginal exam following the transabdominal exam to visualize the uterus, endometrium, ovaries and adnexa. Color and duplex Doppler ultrasound was utilized to evaluate blood flow to the ovaries.   COMPARISON:  CT today   FINDINGS: Uterus   Measurements: 9.3 x 4.0 x 5.3 cm = volume: 104 mL. No fibroids or other mass visualized.   Endometrium   Thickness: Normal thickness, 3 mm.  No focal abnormality visualized.   Right ovary   Measurements: 3.0 x 2.0 x 2.1 cm = volume: 6.5 mL. Normal appearance/no adnexal mass.   Left ovary   Measurements: 5.3 x 2.2 x 2.0 cm = volume: 12 mL. Follicles noted measuring 2.4 and 1.7 cm. No adnexal mass.   Pulsed Doppler evaluation of both ovaries demonstrates normal low-resistance arterial and venous waveforms.   Other findings   Trace free  fluid.   IMPRESSION: No acute findings or significant abnormality. No evidence of ovarian torsion.     Electronically Signed   By: Charlett Nose M.D.   On: 09/28/2023 19:16      Result History  US PELVIC COMPLETE W TRANSVAGINAL AND TORSION R/O (Order #[id number]) on 09/28/2023 - Order Result History Report  MyChart Results Release  MyChart Status: Active  Results Release   Encounter-Level Documents on 09/28/2023:  Document on 10/02/2023 7:45 PM by Rondel Oh, RN: ED PB Billing Extract Document on 09/28/2023 7:38 PM by Rondel Oh, RN: ED After Visit Summary Electronic signature on 09/28/2023 3:36 PM - E-signed Electronic signature on 09/28/2023 2:26 PM - Not e-signed Scan on 09/28/2023 3:49 PM by Default, Provider, MD      Order-Level Documents on 09/28/2023:  Scan on 09/28/2023 3:48 PM by Aundra Millet, RT: preg waiver      [facility] Account-Level Documents:  There are no hospital account-level documents. Vitals  Height Weight BMI (Calculated)  5\' 3"  (1.6 m) 189 lb (85.7 kg) 33.49   Imaging  Imaging Information    US PELVIC COMPLETE W TRANSVAGINAL AND TORSION R/O: Patient Communication   Add Comments   Seen   Resulted by:  Signed Date/Time  Phone Pager  Charlett Nose 09/28/2023  7:16 PM [phone]   Link to IR Documentation Timeline  Sedation   Reference Links       Original Order  Ordered On Ordered By   09/28/2023  6:14 PM Lennon Alstrom, RT        Order History    Parent Order ID Child Order ID  [id number]   US PELVIC COMPLETE W TRANSVAGINAL AND TORSION R/O [[id number]] Resulted: 09/28/23 1916, Result status: Final result Ordering provider: Halford Decamp, PA-C  09/28/23 1814 Resulted by: Charlett Nose, MD  Performed: 09/28/23 1836 - 09/28/23 1904 Accession number: [id number]  Resulting lab: [facility]  Narrative: CLINICAL DATA:  Right lower quadrant pain.  EXAM: TRANSABDOMINAL AND TRANSVAGINAL  ULTRASOUND OF PELVIS  DOPPLER ULTRASOUND OF OVARIES  TECHNIQUE: Both transabdominal and transvaginal ultrasound examinations of the pelvis were performed. Transabdominal technique was performed for global imaging of the pelvis including uterus, ovaries, adnexal regions, and pelvic cul-de-sac.  It was necessary to proceed with endovaginal exam following the transabdominal exam to visualize the uterus,  endometrium, ovaries and adnexa. Color and duplex Doppler ultrasound was utilized to evaluate blood flow to the ovaries.  COMPARISON:  CT today  FINDINGS: Uterus  Measurements: 9.3 x 4.0 x 5.3 cm = volume: 104 mL. No fibroids or other mass visualized.  Endometrium  Thickness: Normal thickness, 3 mm.  No focal abnormality visualized.  Right ovary  Measurements: 3.0 x 2.0 x 2.1 cm = volume: 6.5 mL. Normal appearance/no adnexal mass.  Left ovary  Measurements: 5.3 x 2.2 x 2.0 cm = volume: 12 mL. Follicles noted measuring 2.4 and 1.7 cm. No adnexal mass.  Pulsed Doppler evaluation of both ovaries demonstrates normal low-resistance arterial and venous waveforms.  Other findings  Trace free fluid.  IMPRESSION: No acute findings or significant abnormality. No evidence of ovarian torsion.   Electronically Signed   By: Janeece Mechanic M.D.   On: 09/28/2023 19:16      Assessment     1. Pelvic pain (Primary) Rx: - POCT Urinalysis Dipstick - Urine Culture - Cervicovaginal ancillary only( [place])  2. Mastodynia, female Rx: - MM 3D DIAGNOSTIC MAMMOGRAM BILATERAL BREAST; Future - US  LIMITED ULTRASOUND INCLUDING AXILLA LEFT BREAST ; Future  3. Encounter for gynecological examination with Papanicolaou smear of cervix Rx: - Cytology - PAP - Hemoglobin A1c - VITAMIN D 25 Hydroxy (Vit-D Deficiency, Fractures)     Plan    Orders Placed This Encounter  Procedures   Urine Culture   MM 3D DIAGNOSTIC MAMMOGRAM BILATERAL BREAST    Standing Status:   Future     Expiration Date:   10/09/2024    Reason for Exam (SYMPTOM  OR DIAGNOSIS REQUIRED):   Mastodynia, bilateral    Is the patient pregnant?:   No    Preferred imaging location?:   [facility]   US  LIMITED ULTRASOUND INCLUDING AXILLA LEFT BREAST     INS//[facility] ORDER IN PROF PF// NO NEEDS; NO IMPLANTS; NO HX BR CA-TM/PT  PT AWARE OF 75$ NO SHOW/CX FEE    Standing Status:   Future    Expiration Date:   10/09/2024    Reason for Exam (SYMPTOM  OR DIAGNOSIS REQUIRED):   MASTODYNIA    Preferred Imaging Location?:   [facility]   Hemoglobin A1c   VITAMIN D 25 Hydroxy (Vit-D Deficiency, Fractures)   POCT Urinalysis Dipstick     Gabrielle Joiner, MD, FACOG Attending Obstetrician & Gynecologist, [facility] for [facility], [facility], [place] 10/10/2023

## 2023-10-11 ENCOUNTER — Other Ambulatory Visit: Payer: Self-pay | Admitting: Obstetrics

## 2023-10-11 ENCOUNTER — Encounter: Payer: Self-pay | Admitting: Obstetrics

## 2023-10-11 DIAGNOSIS — E559 Vitamin D deficiency, unspecified: Secondary | ICD-10-CM

## 2023-10-11 LAB — HEMOGLOBIN A1C
Est. average glucose Bld gHb Est-mCnc: 103 mg/dL
Hgb A1c MFr Bld: 5.2 % (ref 4.8–5.6)

## 2023-10-11 LAB — VITAMIN D 25 HYDROXY (VIT D DEFICIENCY, FRACTURES): Vit D, 25-Hydroxy: 24.8 ng/mL — ABNORMAL LOW (ref 30.0–100.0)

## 2023-10-11 MED ORDER — VITAMIN D (ERGOCALCIFEROL) 1.25 MG (50000 UNIT) PO CAPS: 50000.0000 [IU] | ORAL_CAPSULE | ORAL | 0 refills | Status: DC

## 2023-10-12 LAB — CERVICOVAGINAL ANCILLARY ONLY
Bacterial Vaginitis (gardnerella): NEGATIVE
Candida Glabrata: NEGATIVE
Candida Vaginitis: NEGATIVE
Comment: NEGATIVE
Comment: NEGATIVE
Comment: NEGATIVE

## 2023-10-12 LAB — URINE CULTURE

## 2023-10-17 LAB — CYTOLOGY - PAP
Comment: NEGATIVE
Diagnosis: NEGATIVE
High risk HPV: NEGATIVE

## 2023-10-23 ENCOUNTER — Other Ambulatory Visit: Payer: Self-pay

## 2023-10-23 MED ORDER — NORETHINDRONE 0.35 MG PO TABS: 1.0000 | ORAL_TABLET | Freq: Every day | ORAL | 1 refills | Status: AC

## 2023-10-24 ENCOUNTER — Telehealth: Admitting: Obstetrics

## 2023-10-26 ENCOUNTER — Encounter

## 2023-10-26 ENCOUNTER — Other Ambulatory Visit

## 2023-10-30 ENCOUNTER — Telehealth: Admitting: Obstetrics

## 2023-10-30 NOTE — Addendum Note (Signed)
 Addended by: Ina Manas on: 10/30/2023 04:11 PM   Modules accepted: Orders

## 2024-01-15 ENCOUNTER — Other Ambulatory Visit: Payer: Self-pay | Admitting: Obstetrics & Gynecology

## 2024-01-31 ENCOUNTER — Telehealth: Admitting: Physician Assistant

## 2024-01-31 DIAGNOSIS — R3989 Other symptoms and signs involving the genitourinary system: Secondary | ICD-10-CM | POA: Diagnosis not present

## 2024-02-01 MED ORDER — FLUCONAZOLE 150 MG PO TABS
150.0000 mg | ORAL_TABLET | ORAL | 0 refills | Status: AC | PRN
Start: 2024-02-01 — End: ?

## 2024-02-01 MED ORDER — CEPHALEXIN 500 MG PO CAPS: 500.0000 mg | ORAL_CAPSULE | Freq: Two times a day (BID) | ORAL | 0 refills | Status: AC

## 2024-02-01 NOTE — Addendum Note (Signed)
 Addended by: Reiner Loewen M on: 02/01/2024 05:42 PM   Modules accepted: Orders

## 2024-02-01 NOTE — Progress Notes (Signed)
 I have spent 5 minutes in review of e-visit questionnaire, review and updating patient chart, medical decision making and response to patient.   Laure Kidney, PA-C

## 2024-02-01 NOTE — Progress Notes (Signed)

## 2024-02-05 ENCOUNTER — Other Ambulatory Visit: Payer: Self-pay | Admitting: Obstetrics

## 2024-02-05 DIAGNOSIS — E559 Vitamin D deficiency, unspecified: Secondary | ICD-10-CM

## 2024-02-28 ENCOUNTER — Emergency Department (HOSPITAL_BASED_OUTPATIENT_CLINIC_OR_DEPARTMENT_OTHER)
Admission: EM | Admit: 2024-02-28 | Discharge: 2024-02-29 | Disposition: A | Discharge status: Home / Self Care | Attending: Emergency Medicine | Admitting: Emergency Medicine

## 2024-02-28 ENCOUNTER — Emergency Department (HOSPITAL_BASED_OUTPATIENT_CLINIC_OR_DEPARTMENT_OTHER)

## 2024-02-28 ENCOUNTER — Other Ambulatory Visit: Payer: Self-pay

## 2024-02-28 DIAGNOSIS — R1084 Generalized abdominal pain: Secondary | ICD-10-CM | POA: Diagnosis not present

## 2024-02-28 DIAGNOSIS — R109 Unspecified abdominal pain: Secondary | ICD-10-CM | POA: Diagnosis present

## 2024-02-28 LAB — CBC
HCT: 36.1 % (ref 36.0–46.0)
Hemoglobin: 12.2 g/dL (ref 12.0–15.0)
MCH: 31.1 pg (ref 26.0–34.0)
MCHC: 33.8 g/dL (ref 30.0–36.0)
MCV: 92.1 fL (ref 80.0–100.0)
Platelets: 269 K/uL (ref 150–400)
RBC: 3.92 MIL/uL (ref 3.87–5.11)
RDW: 12 % (ref 11.5–15.5)
WBC: 9.8 K/uL (ref 4.0–10.5)
nRBC: 0 % (ref 0.0–0.2)

## 2024-02-28 LAB — COMPREHENSIVE METABOLIC PANEL WITH GFR
ALT: 18 U/L (ref 0–44)
AST: 16 U/L (ref 15–41)
Albumin: 4.4 g/dL (ref 3.5–5.0)
Alkaline Phosphatase: 73 U/L (ref 38–126)
Anion gap: 15 (ref 5–15)
BUN: 10 mg/dL (ref 6–20)
CO2: 22 mmol/L (ref 22–32)
Calcium: 9.9 mg/dL (ref 8.9–10.3)
Chloride: 100 mmol/L (ref 98–111)
Creatinine, Ser: 0.68 mg/dL (ref 0.44–1.00)
GFR, Estimated: >60 mL/min (ref >60)
Glucose, Bld: 89 mg/dL (ref 70–99)
Potassium: 3.4 mmol/L — ABNORMAL LOW (ref 3.5–5.1)
Sodium: 137 mmol/L (ref 135–145)
Total Bilirubin: 0.4 mg/dL (ref 0.0–1.2)
Total Protein: 7.3 g/dL (ref 6.5–8.1)

## 2024-02-28 LAB — URINALYSIS, ROUTINE W REFLEX MICROSCOPIC
Bilirubin Urine: NEGATIVE
Glucose, UA: NEGATIVE mg/dL
Hgb urine dipstick: NEGATIVE
Ketones, ur: NEGATIVE mg/dL
Leukocytes,Ua: NEGATIVE
Nitrite: NEGATIVE
Protein, ur: NEGATIVE mg/dL
Specific Gravity, Urine: <1.005 — ABNORMAL LOW (ref 1.005–1.030)
pH: 6.5 (ref 5.0–8.0)

## 2024-02-28 LAB — PREGNANCY, URINE: Preg Test, Ur: NEGATIVE

## 2024-02-28 LAB — LIPASE, BLOOD: Lipase: 28 U/L (ref 11–51)

## 2024-02-28 MED ORDER — FAMOTIDINE IN NACL 20-0.9 MG/50ML-% IV SOLN
20.0000 mg | Freq: Once | INTRAVENOUS | Status: AC
  Administered 2024-02-28: 20 mg via INTRAVENOUS
  Filled 2024-02-28: qty 50

## 2024-02-28 MED ORDER — LIDOCAINE VISCOUS HCL 2 % MT SOLN
15.0000 mL | Freq: Once | OROMUCOSAL | Status: AC
  Administered 2024-02-29: 15 mL via ORAL
  Filled 2024-02-28: qty 15

## 2024-02-28 MED ORDER — IOHEXOL 300 MG/ML  SOLN
100.0000 mL | Freq: Once | INTRAMUSCULAR | Status: AC | PRN
  Administered 2024-02-28: 100 mL via INTRAVENOUS

## 2024-02-28 MED ORDER — ALUM & MAG HYDROXIDE-SIMETH 200-200-20 MG/5ML PO SUSP
30.0000 mL | Freq: Once | ORAL | Status: AC
  Administered 2024-02-29: 30 mL via ORAL
  Filled 2024-02-28: qty 30

## 2024-02-28 MED ORDER — HYOSCYAMINE SULFATE 0.125 MG SL SUBL
0.2500 mg | SUBLINGUAL_TABLET | Freq: Once | SUBLINGUAL | Status: AC
  Administered 2024-02-29: 0.25 mg via SUBLINGUAL
  Filled 2024-02-28: qty 2

## 2024-02-28 NOTE — ED Provider Notes (Signed)
  Physical Exam  BP (!) 179/92   Pulse 75   Temp 98 F (36.7 C) (Oral)   Resp 18   SpO2 98%   Physical Exam  Procedures  Procedures  ED Course / MDM    Medical Decision Making Amount and/or Complexity of Data Reviewed Labs: ordered. Radiology: ordered.  Risk Prescription drug management.   Signout taken from [employer].  Patient here for 2 days of abdominal pain in the middle of her abdomen, labs urine exam overall reassuring.  Patient with still having pain, so x-ray was ordered and is pending at this time.  Signed out to me pending x-ray  Reevaluated patient after x-ray returned with nonobstructive bowel gas pattern, no free air.  I reevaluated patient, her abdomen is soft with tenderness in the periumbilical area.  No rebound guarding rigidity, normal bowel sounds.  I long discussion with the patient she complaining of 2 days of sharp stabbing pain and she feels strongly that she is CT to rule out possible causes of her pain.  She states last time she was in this much discomfort she had had her gallbladder out.  CT was ordered, still pending at signout, signed out to Dr. Trine Barks, [place] LABOR, PA-C 02/29/24 0017    Emil Share, DO 02/29/24 [phone]

## 2024-02-28 NOTE — Discharge Instructions (Addendum)
 Your lab work was reassuring today.  X-ray did not show any concerning findings.  A GI referral is included in paperwork it is recommended to follow-up with them or your established GI doctor for further evaluation of abdominal pain.  If symptoms worsen or new concerning findings present return to ED for further evaluation.

## 2024-02-28 NOTE — ED Provider Notes (Signed)
 [place] EMERGENCY DEPARTMENT AT [facility] Provider Note   CSN: [id number] Arrival date & time: 02/28/24  1749     Patient presents with: Abdominal Pain   Christina Fox is a 43 y.o. female.  43 year old female presents to the ED with complaints of 3 days of abdominal pain.  Patient denies any nausea vomiting diarrhea constipation fevers urinary symptoms vaginal discharge or vaginal pain.  Patient is concerned for continued pain and has history of acid reflux.     Prior to Admission medications   Medication Sig Start Date End Date Taking? Authorizing Provider  fluconazole  (DIFLUCAN ) 150 MG tablet Take 1 tablet (150 mg total) by mouth every 3 (three) days as needed. 02/01/24   Kabbe, Angela M, NP  fluticasone  (FLONASE ) 50 MCG/ACT nasal spray Place into both nostrils daily.    [provider]  ibuprofen  (ADVIL ) 800 MG tablet TAKE 1 TABLET BY MOUTH EVERY 8 HOURS AS NEEDED 06/06/23   Rudy Carlin LABOR, MD  levocetirizine (XYZAL ALLERGY 24HR) 5 MG tablet  10/04/20   [provider]  losartan (COZAAR) 50 MG tablet Take 50 mg by mouth daily. 11/10/21   [provider]  montelukast  (SINGULAIR ) 10 MG tablet Take 10 mg by mouth daily. 12/28/21   [provider]  norethindrone  (MICRONOR ) 0.35 MG tablet Take 1 tablet (0.35 mg total) by mouth daily. 10/23/23   Eveline Lynwood MATSU, MD  potassium chloride  SA (KLOR-CON  M) 20 MEQ tablet Take 1 tablet (20 mEq total) by mouth 2 (two) times daily for 5 days. 12/14/21 12/19/21  Weinhold, Samantha C, CNM  Prenat-Fe Poly-Methfol-FA-DHA (VITAFOL  ULTRA) 29-0.6-0.4-200 MG CAPS Take 1 capsule by mouth daily. 06/23/23   Rudy Carlin LABOR, MD  Vitamin D , Ergocalciferol , (DRISDOL ) 1.25 MG (50000 UNIT) CAPS capsule TAKE 1 CAPSULE (50,000 UNITS TOTAL) BY MOUTH EVERY 7 (SEVEN) DAYS 02/05/24   Harper, Charles A, MD  escitalopram (LEXAPRO) 10 MG tablet Take 10 mg by mouth daily.    09/02/11  [provider]    Allergies:  Clarithromycin , Doxycycline , Bentyl  [dicyclomine  hcl], Cephalexin , Haldol  [haloperidol ], Meclizine , Morphine  and codeine , Toradol  [ketorolac  tromethamine ], Amlodipine , Nitrofurantoin , and Sulfa antibiotics    Review of Systems  Gastrointestinal:  Positive for abdominal pain.  All other systems reviewed and are negative.   Updated Vital Signs BP (!) 179/92   Pulse 75   Temp 98 F (36.7 C) (Oral)   Resp 18   SpO2 98%   Physical Exam Vitals and nursing note reviewed.  Constitutional:      Appearance: Normal appearance.  HENT:     Head: Normocephalic and atraumatic.     Nose: Nose normal.  Eyes:     Extraocular Movements: Extraocular movements intact.     Conjunctiva/sclera: Conjunctivae normal.     Pupils: Pupils are equal, round, and reactive to light.  Cardiovascular:     Rate and Rhythm: Normal rate.  Pulmonary:     Effort: Pulmonary effort is normal. No respiratory distress.     Breath sounds: Normal breath sounds.  Abdominal:     General: Abdomen is flat. Bowel sounds are normal. There is no distension.     Tenderness: There is abdominal tenderness in the periumbilical area. There is no right CVA tenderness, left CVA tenderness, guarding or rebound. Negative signs include Rovsing's sign and McBurney's sign.  Musculoskeletal:        General: Normal range of motion.     Cervical back: Normal range of motion.  Skin:  General: Skin is warm and dry.     Capillary Refill: Capillary refill takes less than 2 seconds.  Neurological:     General: No focal deficit present.     Mental Status: She is alert.  Psychiatric:        Mood and Affect: Mood normal.        Behavior: Behavior normal.     (all labs ordered are listed, but only abnormal results are displayed) Labs Reviewed  COMPREHENSIVE METABOLIC PANEL WITH GFR - Abnormal; Notable for the following components:      Result Value   Potassium 3.4 (*)    All other components within normal limits  URINALYSIS, ROUTINE W  REFLEX MICROSCOPIC - Abnormal; Notable for the following components:   Color, Urine COLORLESS (*)    Specific Gravity, Urine <1.005 (*)    All other components within normal limits  LIPASE, BLOOD  CBC  PREGNANCY, URINE    EKG: None  Radiology: No results found.  Procedures   Medications Ordered in the ED - No data to display  43 y.o. female presents to the ED with complaints of abdominal pain x 3 days, this involves an extensive number of treatment options, and is a complaint that carries with it a high risk of complications and morbidity.  The differential diagnosis includes appendicitis, gastritis, abdominal aortic aneurysm, gastric ulcer, acid reflux, (Ddx)  On arrival pt is nontoxic, vitals unremarkable. Exam significant for periumbilical abdominal pain  Lab Tests:  I Ordered, reviewed, and interpreted labs, which included: No acute abnormalities  Imaging Studies ordered:  I ordered imaging studies which included abdominal x-ray, waiting for results at time of patient transfer  ED Course:   Patient is nontoxic-appearing on exam laying comfortably in ED bed in no acute distress.  Patient has reported pain to palpation in the periumbilical area.  On exam deep palpation did not cause any noticeable discomfort and there is negative McBurney's.  No bruits noted on abdominal exam.  No CVA tenderness.  Patient has no shortness of breath and clear to auscultation all fields.  Patient has history of cholecystectomy and chronic acid reflux.  Patient was initially concern for gastric ulcer but does not have consistent pain after eating.  Patient reports this pain has been very intermittent and random in consistency.  Patient denies any recent illnesses and all lab work was negative for any acute findings.  After further discussion with patient she reports she would feel more comfortable if she had abdominal x-ray.  Patient denies any constipation, diarrhea, vomiting, nausea, discharge,  urinary symptoms.  There is no pain to palpation in her right and left lower quadrants.  Patient declined Bentyl  at bedside.  While waiting for results from abdominal x-ray patient care was transferred to [facility]      Portions of this note were generated with Dragon dictation software. Dictation errors may occur despite best attempts at proofreading.   Final diagnoses:  Generalized abdominal pain    ED Discharge Orders     None          Myriam Fonda GORMAN DEVONNA 02/28/24 2232    Emil Share, DO 02/28/24 2302

## 2024-02-28 NOTE — ED Triage Notes (Signed)
 Patient reports generalized abdominal since Monday. Denies n/v/d.

## 2024-02-29 NOTE — ED Provider Notes (Signed)
 I assumed care of this patient from previous provider.  Please see their note for further details of history, exam, and MDM.   Briefly patient is a 43 y.o. female who presented abd pain pending CT.  CT negative.  The patient appears reasonably screened and/or stabilized for discharge and I doubt any other medical condition or other [facility] requiring further screening, evaluation, or treatment in the ED at this time. I have discussed the findings, Dx and Tx plan with the patient/family who expressed understanding and agree(s) with the plan. Discharge instructions discussed at length. The patient/family was given strict return precautions who verbalized understanding of the instructions. No further questions at time of discharge.  Disposition: Discharge  Condition: Good  ED Discharge Orders     None       Follow Up: Gastroenterology, [facility] [address] [place] [address] [phone]  Schedule an appointment as soon as possible for a visit    Jerome Heron Ruth, PA-C [address] [place], [place] [address] [phone]             Trine Raynell Moder, MD 02/29/24 [phone]

## 2024-03-04 ENCOUNTER — Encounter

## 2024-03-07 ENCOUNTER — Encounter (HOSPITAL_BASED_OUTPATIENT_CLINIC_OR_DEPARTMENT_OTHER): Payer: Self-pay | Admitting: *Deleted

## 2024-03-07 ENCOUNTER — Emergency Department (HOSPITAL_BASED_OUTPATIENT_CLINIC_OR_DEPARTMENT_OTHER)
Admission: EM | Admit: 2024-03-07 | Discharge: 2024-03-07 | Disposition: A | Discharge status: Home / Self Care | Source: Ambulatory Visit

## 2024-03-07 ENCOUNTER — Other Ambulatory Visit: Payer: Self-pay

## 2024-03-07 ENCOUNTER — Emergency Department (HOSPITAL_BASED_OUTPATIENT_CLINIC_OR_DEPARTMENT_OTHER)

## 2024-03-07 DIAGNOSIS — I10 Essential (primary) hypertension: Secondary | ICD-10-CM | POA: Diagnosis not present

## 2024-03-07 DIAGNOSIS — D72829 Elevated white blood cell count, unspecified: Secondary | ICD-10-CM | POA: Diagnosis not present

## 2024-03-07 DIAGNOSIS — L0390 Cellulitis, unspecified: Secondary | ICD-10-CM

## 2024-03-07 DIAGNOSIS — J029 Acute pharyngitis, unspecified: Secondary | ICD-10-CM | POA: Diagnosis present

## 2024-03-07 DIAGNOSIS — K1120 Sialoadenitis, unspecified: Secondary | ICD-10-CM

## 2024-03-07 LAB — CBC
HCT: 37.2 % (ref 36.0–46.0)
Hemoglobin: 12.6 g/dL (ref 12.0–15.0)
MCH: 31.2 pg (ref 26.0–34.0)
MCHC: 33.9 g/dL (ref 30.0–36.0)
MCV: 92.1 fL (ref 80.0–100.0)
Platelets: 298 K/uL (ref 150–400)
RBC: 4.04 MIL/uL (ref 3.87–5.11)
RDW: 12.1 % (ref 11.5–15.5)
WBC: 16.1 K/uL — ABNORMAL HIGH (ref 4.0–10.5)
nRBC: 0 % (ref 0.0–0.2)

## 2024-03-07 LAB — BASIC METABOLIC PANEL WITH GFR
Anion gap: 13 (ref 5–15)
BUN: 7 mg/dL (ref 6–20)
CO2: 25 mmol/L (ref 22–32)
Calcium: 9.6 mg/dL (ref 8.9–10.3)
Chloride: 101 mmol/L (ref 98–111)
Creatinine, Ser: 0.7 mg/dL (ref 0.44–1.00)
GFR, Estimated: >60 mL/min (ref >60)
Glucose, Bld: 85 mg/dL (ref 70–99)
Potassium: 3.1 mmol/L — ABNORMAL LOW (ref 3.5–5.1)
Sodium: 139 mmol/L (ref 135–145)

## 2024-03-07 LAB — MAGNESIUM: Magnesium: 2.1 mg/dL (ref 1.7–2.4)

## 2024-03-07 LAB — HCG, SERUM, QUALITATIVE: Preg, Serum: NEGATIVE

## 2024-03-07 MED ORDER — IOHEXOL 300 MG/ML  SOLN
75.0000 mL | Freq: Once | INTRAMUSCULAR | Status: AC | PRN
  Administered 2024-03-07: 75 mL via INTRAVENOUS

## 2024-03-07 MED ORDER — CLINDAMYCIN HCL 300 MG PO CAPS: 300.0000 mg | ORAL_CAPSULE | Freq: Three times a day (TID) | ORAL | 0 refills | Status: AC

## 2024-03-07 MED ORDER — IBUPROFEN 800 MG PO TABS
800.0000 mg | ORAL_TABLET | Freq: Once | ORAL | Status: AC
  Administered 2024-03-07: 800 mg via ORAL
  Filled 2024-03-07: qty 1

## 2024-03-07 MED ORDER — CELECOXIB 200 MG PO CAPS
200.0000 mg | ORAL_CAPSULE | Freq: Two times a day (BID) | ORAL | 0 refills | Status: DC | PRN
Start: 2024-03-07 — End: 2024-04-18

## 2024-03-07 NOTE — ED Provider Notes (Signed)
 [place] [facility] AT [facility] Provider Note   CSN: [id number] Arrival date & time: 03/07/24  1218     Patient presents with: neck swelling   Christina Fox is a 43 y.o. female.   HPI   43 year old female presents emergency department with complaints of swelling beneath her drawl.  States that she has been having symptoms since Monday mainly on the right side.  Reports pain with opening her jaw.  Denies any dental pain.  Denies any fevers, chills, difficulty breathing, feelings of throat closing on her.  Was seen by primary care on Monday had negative strep, viral testing.  Was placed empirically on antibiotics as well as steroid of which she has been taking without improvement.  Saw ENT yesterday and had part of the ear ring removed from her left ear.  Presents due to continued symptoms.  Has not had symptoms like this before in the past.  Past medical history significant for GERD, kidney stone, anxiety, PID, hypertension  Prior to Admission medications   Medication Sig Start Date End Date Taking? Authorizing Provider  fluconazole  (DIFLUCAN ) 150 MG tablet Take 1 tablet (150 mg total) by mouth every 3 (three) days as needed. 02/01/24   Kabbe, Angela M, NP  fluticasone  (FLONASE ) 50 MCG/ACT nasal spray Place into both nostrils daily.    [provider]  ibuprofen  (ADVIL ) 800 MG tablet TAKE 1 TABLET BY MOUTH EVERY 8 HOURS AS NEEDED 06/06/23   Rudy Carlin LABOR, MD  levocetirizine (XYZAL ALLERGY 24HR) 5 MG tablet  10/04/20   [provider]  losartan (COZAAR) 50 MG tablet Take 50 mg by mouth daily. 11/10/21   [provider]  montelukast  (SINGULAIR ) 10 MG tablet Take 10 mg by mouth daily. 12/28/21   [provider]  norethindrone  (MICRONOR ) 0.35 MG tablet Take 1 tablet (0.35 mg total) by mouth daily. 10/23/23   Eveline Lynwood MATSU, MD  potassium chloride  SA (KLOR-CON  M) 20 MEQ tablet Take 1 tablet (20 mEq total) by mouth 2 (two) times daily  for 5 days. 12/14/21 12/19/21  Weinhold, Samantha C, CNM  Prenat-Fe Poly-Methfol-FA-DHA (VITAFOL  ULTRA) 29-0.6-0.4-200 MG CAPS Take 1 capsule by mouth daily. 06/23/23   Rudy Carlin LABOR, MD  Vitamin D , Ergocalciferol , (DRISDOL ) 1.25 MG (50000 UNIT) CAPS capsule TAKE 1 CAPSULE (50,000 UNITS TOTAL) BY MOUTH EVERY 7 (SEVEN) DAYS 02/05/24   Rudy Carlin LABOR, MD  escitalopram (LEXAPRO) 10 MG tablet Take 10 mg by mouth daily.    09/02/11  [provider]    Allergies: Clarithromycin , Doxycycline , Bentyl  [dicyclomine  hcl], Cephalexin , Haldol  [haloperidol ], Meclizine , Morphine  and codeine , Toradol  [ketorolac  tromethamine ], Amlodipine , Nitrofurantoin , and Sulfa antibiotics    Review of Systems  All other systems reviewed and are negative.   Updated Vital Signs BP (!) 152/90 (BP Location: Right Arm)   Pulse 64   Temp 98 F (36.7 C)   Resp 16   Ht 5' 3 (1.6 m)   Wt 81.2 kg   LMP  (LMP Unknown)   SpO2 100%   BMI 31.71 kg/m   Physical Exam Vitals and nursing note reviewed.  Constitutional:      General: She is not in acute distress.    Appearance: She is well-developed.  HENT:     Head: Normocephalic and atraumatic.     Mouth/Throat:     Comments: Very minimal trismus appreciated as this worsens submandibular pain.  Tenderness right greater than left submandibular area with minimal swelling appreciated.  Tenderness also inferior aspect  of parotid gland right greater than left.  No overlying erythema, palpable fluctuance/induration.  No obvious dental tenderness/gingival tenderness.  No sublingual swelling.  Uvula midline and rises symmetric with phonation.  Tonsils 1+ bilaterally without exudate.  No auscultatory stridor. Eyes:     Conjunctiva/sclera: Conjunctivae normal.  Cardiovascular:     Rate and Rhythm: Normal rate and regular rhythm.     Heart sounds: No murmur heard. Pulmonary:     Effort: Pulmonary effort is normal. No respiratory distress.     Breath sounds: Normal breath  sounds.  Abdominal:     Palpations: Abdomen is soft.     Tenderness: There is no abdominal tenderness.  Musculoskeletal:        General: No swelling.     Cervical back: Neck supple.  Skin:    General: Skin is warm and dry.     Capillary Refill: Capillary refill takes less than 2 seconds.  Neurological:     Mental Status: She is alert.  Psychiatric:        Mood and Affect: Mood normal.     (all labs ordered are listed, but only abnormal results are displayed) Labs Reviewed  CBC - Abnormal; Notable for the following components:      Result Value   WBC 16.1 (*)    All other components within normal limits  BASIC METABOLIC PANEL WITH GFR - Abnormal; Notable for the following components:   Potassium 3.1 (*)    All other components within normal limits  HCG, SERUM, QUALITATIVE  MAGNESIUM     EKG: None  Radiology: No results found.   Procedures   Medications Ordered in the ED  ibuprofen  (ADVIL ) tablet 800 mg (800 mg Oral Given 03/07/24 1417)    Clinical Course as of 03/07/24 1444  Thu Mar 07, 2024  1440 WBC(!): 16.1 Patient currently on steroid taper [CR]    Clinical Course User Index [CR] Silver Wonda LABOR, PA                                 Medical Decision Making Amount and/or Complexity of Data Reviewed Labs: ordered. Decision-making details documented in ED Course. Radiology: ordered.  Risk Prescription drug management.   This patient presents to the ED for concern of floor mouth swelling, this involves an extensive number of treatment options, and is a complaint that carries with it a high risk of complications and morbidity.  The differential diagnosis includes sialoadenitis, sialolithiasis, Ludwig angina, cervical lymphadenopathy, other   Co morbidities that complicate the patient evaluation  See HPI   Additional history obtained:  Additional history obtained from EMR External records from outside source obtained and reviewed including hospital  records   Lab Tests:  I Ordered, and personally interpreted labs.  The pertinent results include:  leukocytosis 16.1.  No evidence of anemia.  Platelets within range.  Mild hypokalemia 3.1 otherwise, electrolytes within normal limits.  No renal dysfunction.  Magnesium  within normal limits.   Imaging Studies ordered:  I ordered imaging studies including ET maxillofacial I independently visualized and interpreted imaging which showed bilateral parotid gland enlargement mild adjacent stranding.  Patient with subcutaneous stranding compatible cellulitis.  Right to a cervical lymph node enlargement. I agree with the radiologist interpretation  Cardiac Monitoring: / EKG:  The patient was maintained on a cardiac monitor.  I personally viewed and interpreted the cardiac monitored which showed an underlying rhythm of: Sinus rhythm  Consultations Obtained:  N/a   Problem List / ED Course / Critical interventions / Medication management  Parotitis, facial cellulitis I ordered medication including Motrin    Reevaluation of the patient after these medicines showed that the patient improved I have reviewed the patients home medicines and have made adjustments as needed   Social Determinants of Health:  Former cigarette use.  Denies illicit drug use.   Test / Admission - Considered:  Parotitis, facial cellulitis Vitals signs significant for hypertension blood pressure 150/90.SABRA Otherwise within normal range and stable throughout visit. Laboratory/imaging studies significant for: See above  43 year old female presents emergency department with complaints of swelling beneath her drawl.  States that she has been having symptoms since Monday mainly on the right side.  Reports pain with opening her jaw.  Denies any dental pain.  Denies any fevers, chills, difficulty breathing, feelings of throat closing on her.  Was seen by primary care on Monday had negative strep, viral testing.  Was placed  empirically on antibiotics as well as steroid of which she has been taking without improvement.  Saw ENT yesterday and had part of the ear ring removed from her left ear.  Presents due to continued symptoms.  Has not had symptoms like this before in the past. On exam, right greater than left parotid swelling/tenderness as well as similarly submandibular area.  Dentition without palpable tenderness, gingiva similarly.  Labs concerning for leukocytosis of 16.1; could be secondary to infectious etiology but also patient is on steroid taper currently so unsure of exact true white count independent of corticosteroid therapy.  Mild hypokalemia to be supplemented the outpatient setting.  Otherwise, labs normal.  CT imaging concerning for parotitis, cervical lymph node enlargement as well as facial cellulitis as above.  This is most likely etiology of symptoms.  No evidence of Ludwig angina.  Patient has been taking Augmentin .  Will escalate antibiotic therapy as well as recommend additional symptomatic therapy as an AVS.  Will recommend follow-up with primary care for reassessment.  Treatment plan discussed with patient she acknowledged understanding was agreeable.  Patient well-appearing, afebrile in no acute distress. Worrisome signs and symptoms were discussed with the patient, and the patient acknowledged understanding to return to the ED if noticed. Patient was stable upon discharge.       Final diagnoses:  None    ED Discharge Orders     None          Silver Wonda LABOR, [place] 03/07/24 1635    Neysa Caron PARAS, DO 03/08/24 1543

## 2024-03-07 NOTE — Discharge Instructions (Addendum)
 As discussed, CT imaging concerning for parotitis as well as cellulitis.  We will change antibiotic to something little bit stronger as well as give you medicine for pain/inflammation.  Recommend close follow-up with the primary care for reassessment.

## 2024-03-07 NOTE — ED Notes (Signed)
 Patient transported to CT

## 2024-03-07 NOTE — ED Triage Notes (Addendum)
 Pt to ED with worse throat swelling. Patient noted neck swelling on Monday.  Pt seen by ENT and tested for COVID, FLU and strep, all tested negative but patient was treated with amoxicillin  and Prednisone  but swelling has worsened. No fevers but pt reports having been consistently taking ibuprofen .   No fevers, redness or heat noted to patient neck

## 2024-03-21 ENCOUNTER — Encounter: Payer: Self-pay | Admitting: Obstetrics

## 2024-03-21 ENCOUNTER — Other Ambulatory Visit: Payer: Self-pay

## 2024-03-21 MED ORDER — LEVONORGESTREL 1.5 MG PO TABS: 1.5000 mg | ORAL_TABLET | Freq: Once | ORAL | 0 refills | Status: DC

## 2024-03-21 MED ORDER — ULIPRISTAL ACETATE 30 MG PO TABS: 1.0000 | ORAL_TABLET | Freq: Once | ORAL | 0 refills | Status: AC

## 2024-04-12 ENCOUNTER — Telehealth: Admitting: Physician Assistant

## 2024-04-12 DIAGNOSIS — B3731 Acute candidiasis of vulva and vagina: Secondary | ICD-10-CM

## 2024-04-12 MED ORDER — FLUCONAZOLE 150 MG PO TABS: 150.0000 mg | ORAL_TABLET | ORAL | 0 refills | Status: DC | PRN

## 2024-04-12 NOTE — Progress Notes (Signed)

## 2024-04-18 ENCOUNTER — Telehealth: Admitting: Physician Assistant

## 2024-04-18 DIAGNOSIS — B379 Candidiasis, unspecified: Secondary | ICD-10-CM

## 2024-04-18 MED ORDER — TERCONAZOLE 0.4 % VA CREA: 1.0000 | TOPICAL_CREAM | Freq: Every day | VAGINAL | 0 refills | Status: AC

## 2024-04-18 NOTE — Progress Notes (Signed)

## 2024-05-21 ENCOUNTER — Telehealth: Admitting: Physician Assistant

## 2024-05-21 DIAGNOSIS — T3695XA Adverse effect of unspecified systemic antibiotic, initial encounter: Secondary | ICD-10-CM | POA: Diagnosis not present

## 2024-05-21 DIAGNOSIS — B379 Candidiasis, unspecified: Secondary | ICD-10-CM

## 2024-05-21 MED ORDER — FLUCONAZOLE 150 MG PO TABS: ORAL_TABLET | ORAL | 0 refills | Status: DC

## 2024-05-21 NOTE — Progress Notes (Signed)

## 2024-06-09 ENCOUNTER — Encounter: Payer: Self-pay | Admitting: Obstetrics

## 2024-06-10 ENCOUNTER — Other Ambulatory Visit: Payer: Self-pay

## 2024-06-10 MED ORDER — ULIPRISTAL ACETATE 30 MG PO TABS: 1.0000 | ORAL_TABLET | Freq: Once | ORAL | 0 refills | Status: AC

## 2024-06-27 ENCOUNTER — Telehealth: Admitting: Physician Assistant

## 2024-06-27 DIAGNOSIS — Z20828 Contact with and (suspected) exposure to other viral communicable diseases: Secondary | ICD-10-CM

## 2024-06-27 DIAGNOSIS — R6889 Other general symptoms and signs: Secondary | ICD-10-CM

## 2024-06-27 MED ORDER — OSELTAMIVIR PHOSPHATE 75 MG PO CAPS: 75.0000 mg | ORAL_CAPSULE | Freq: Two times a day (BID) | ORAL | 0 refills | Status: AC

## 2024-06-27 NOTE — Progress Notes (Signed)
 E visit for Flu like symptoms   We are sorry that you are not feeling well.  Here is how we plan to help! Based on what you have shared with me it looks like you may have a respiratory virus that may be influenza.  Influenza or the flu is  an infection caused by a respiratory virus. The flu virus is highly contagious and persons who did not receive their yearly flu vaccination may catch the flu from close contact.  We have anti-viral medications to treat the viruses that cause this infection. They are not a cure and only shorten the course of the infection. These prescriptions are most effective when they are given within the first 2 days of flu symptoms. Antiviral medications are indicated if you have a high risk of complications from the flu. You should  also consider an antiviral medication if you are in close contact with someone who is at risk. These medications can help patients avoid complications from the flu but have side effects that you should know.   Possible side effects from Tamiflu  or oseltamivir  include nausea, vomiting, diarrhea, dizziness, headaches, eye redness, sleep problems or other respiratory symptoms. You should not take Tamiflu  if you have an allergy to oseltamivir  or any to the ingredients in Tamiflu .  Based upon your symptoms and potential risk factors I have prescribed Oseltamivir  (Tamiflu ).  It has been sent to your designated pharmacy.  You will take one 75 mg capsule orally twice a day for the next 5 days.   For nasal congestion, you may use an oral decongestant such as Mucinex  D or if you have glaucoma or high blood pressure use plain Mucinex .  Saline nasal spray or nasal drops can help and can safely be used as often as needed for congestion.  If you have a sore or scratchy throat, use a saltwater gargle-  to  teaspoon of salt dissolved in a 4-ounce to 8-ounce glass of warm water.  Gargle the solution for approximately 15-30 seconds and then spit.  It is  important not to swallow the solution.  You can also use throat lozenges/cough drops and Chloraseptic spray to help with throat pain or discomfort.  Warm or cold liquids can also be helpful in relieving throat pain.  For headache, pain or general discomfort, you can use Ibuprofen  or Tylenol  as directed.   Some authorities believe that zinc  sprays or the use of Echinacea may shorten the course of your symptoms5  You are to isolate at home until you have been fever-free for at least 24 hours without a fever-reducing medication, and symptoms have been steadily improving for 24 hours.  If you must be around other household members who do not have symptoms, you need to make sure that both you and the family members are masking consistently with a high-quality mask.  If you note any worsening of symptoms despite treatment, please seek an in-person evaluation ASAP. If you note any significant shortness of breath or any chest pain, please seek ED evaluation. Please do not delay care!  ANYONE WHO HAS FLU SYMPTOMS SHOULD: Stay home. The flu is highly contagious and going out or to work exposes others! Be sure to drink plenty of fluids. Water is fine as well as fruit juices, sodas and electrolyte beverages. You may want to stay away from caffeine or alcohol. If you are nauseated, try taking small sips of liquids. How do you know if you are getting enough fluid? Your urine should be a  pale yellow or almost colorless. Get rest. Taking a steamy shower or using a humidifier may help nasal congestion and ease sore throat pain. Using a saline nasal spray works much the same way. Cough drops, hard candies and sore throat lozenges may ease your cough. Line up a caregiver. Have someone check on you regularly.  GET HELP RIGHT AWAY IF: You cannot keep down liquids or your medications. You become short of breath Your fell like you are going to pass out or loose consciousness. Your symptoms persist after you have  completed your treatment plan  MAKE SURE YOU  Understand these instructions. Will watch your condition. Will get help right away if you are not doing well or get worse.  Your e-visit answers were reviewed by a board certified advanced clinical practitioner to complete your personal care plan.  Depending on the condition, your plan could have included both over the counter or prescription medications.  If there is a problem please reply  once you have received a response from your provider.  Your safety is important to us .  If you have drug allergies check your prescription carefully.    You can use MyChart to ask questions about todays visit, request a non-urgent call back, or ask for a work or school excuse for 24 hours related to this e-Visit. If it has been greater than 24 hours you will need to follow up with your provider, or enter a new e-Visit to address those concerns.  You will get an e-mail in the next two days asking about your experience.  I hope that your e-visit has been valuable and will speed your recovery. Thank you for using e-visits.   I have spent 5 minutes in review of e-visit questionnaire, review and updating patient chart, medical decision making and response to patient.   Elsie Velma Lunger, PA-C

## 2024-07-02 DIAGNOSIS — J0110 Acute frontal sinusitis, unspecified: Secondary | ICD-10-CM

## 2024-07-02 MED ORDER — ALBUTEROL SULFATE HFA 108 (90 BASE) MCG/ACT IN AERS: 2.0000 | INHALATION_SPRAY | Freq: Four times a day (QID) | RESPIRATORY_TRACT | 0 refills | Status: AC | PRN

## 2024-07-02 MED ORDER — FLUCONAZOLE 150 MG PO TABS: ORAL_TABLET | ORAL | 0 refills | Status: AC

## 2024-07-02 NOTE — Progress Notes (Signed)
"      E-Visit for Sinus Problems  We are sorry that you are not feeling well.  Here is how we plan to help!  Based on what you have shared with me it looks like you have sinusitis.  Make sure to continue the medications given at urgent care until completed.   You may use an oral decongestant such as Mucinex  D or if you have glaucoma or high blood pressure use plain Mucinex . Saline nasal spray help and can safely be used as often as needed for congestion.  If you develop worsening sinus pain, fever or notice severe headache and vision changes, or if symptoms are not better after completion of antibiotic, please schedule an appointment with a health care provider.    I have sent in a refill of your albuterol  inhaler to use as directed.   Sinus infections are not as easily transmitted as other respiratory infection, however we still recommend that you avoid close contact with loved ones, especially the very young and elderly.  Remember to wash your hands thoroughly throughout the day as this is the number one way to prevent the spread of infection!  I can provide a note from day of each visit with our team and following day. I will send to your MyChart.  Home Care: Only take medications as instructed by your medical team. Complete the entire course of an antibiotic. Do not take these medications with alcohol. A steam or ultrasonic humidifier can help congestion.  You can place a towel over your head and breathe in the steam from hot water coming from a faucet. Avoid close contacts especially the very young and the elderly. Cover your mouth when you cough or sneeze. Always remember to wash your hands.  Get Help Right Away If: You develop worsening fever or sinus pain. You develop a severe head ache or visual changes. Your symptoms persist after you have completed your treatment plan.  Make sure you Understand these instructions. Will watch your condition. Will get help right away if you are  not doing well or get worse.  Your e-visit answers were reviewed by a board certified advanced clinical practitioner to complete your personal care plan.  Depending on the condition, your plan could have included both over the counter or prescription medications.  If there is a problem please reply  once you have received a response from your provider.  Your safety is important to us .  If you have drug allergies check your prescription carefully.    You can use MyChart to ask questions about todays visit, request a non-urgent call back, or ask for a work or school excuse for 24 hours related to this e-Visit. If it has been greater than 24 hours you will need to follow up with your provider, or enter a new e-Visit to address those concerns.  You will get an e-mail in the next two days asking about your experience.  I hope that your e-visit has been valuable and will speed your recovery. Thank you for using e-visits.  I have spent 5 minutes in review of e-visit questionnaire, review and updating patient chart, medical decision making and response to patient.   Elsie Velma Lunger, PA-C     "

## 2024-07-02 NOTE — Addendum Note (Signed)
 Addended by: GLADIS ELSIE BROCKS on: 07/02/2024 01:33 PM   Modules accepted: Orders

## 2024-07-09 ENCOUNTER — Telehealth: Admitting: Family Medicine

## 2024-07-09 DIAGNOSIS — U071 COVID-19: Secondary | ICD-10-CM

## 2024-07-09 NOTE — Progress Notes (Signed)
" ° °  Thank you for the details you included in the comment boxes. Those details are very helpful in determining the best course of treatment for you and help us  to provide the best care.Because Christina Fox, we recommend that you schedule a Virtual Urgent Care video visit in order for the provider to better assess what is going on.  The provider will be able to give you a more accurate diagnosis and treatment plan if we can more freely discuss your symptoms and with the addition of a virtual examination.   If you change your visit to a video visit, we will bill your insurance (similar to an office visit) and you will not be charged for this e-Visit. You will be able to stay at home and speak with the first available [facility] advanced practice provider. The link to do a video visit is in the drop down Menu tab of your Welcome screen in MyChart.       "
